# Patient Record
Sex: Female | Born: 1965 | Race: White | Hispanic: No | Marital: Married | Smoking: Never smoker
Health system: Southern US, Community
[De-identification: ages and names within clinical notes are randomized; demographics above are authoritative.]

## PROBLEM LIST (undated history)

## (undated) ENCOUNTER — Inpatient Hospital Stay: Admission: EM | Payer: Self-pay | Source: Home / Self Care

## (undated) DIAGNOSIS — T8859XA Other complications of anesthesia, initial encounter: Secondary | ICD-10-CM

## (undated) DIAGNOSIS — IMO0001 Reserved for inherently not codable concepts without codable children: Secondary | ICD-10-CM

## (undated) DIAGNOSIS — T4145XA Adverse effect of unspecified anesthetic, initial encounter: Secondary | ICD-10-CM

## (undated) DIAGNOSIS — C50919 Malignant neoplasm of unspecified site of unspecified female breast: Secondary | ICD-10-CM

## (undated) DIAGNOSIS — IMO0002 Reserved for concepts with insufficient information to code with codable children: Secondary | ICD-10-CM

## (undated) DIAGNOSIS — E039 Hypothyroidism, unspecified: Secondary | ICD-10-CM

## (undated) DIAGNOSIS — T7840XA Allergy, unspecified, initial encounter: Secondary | ICD-10-CM

## (undated) HISTORY — DX: Reserved for inherently not codable concepts without codable children: IMO0001

## (undated) HISTORY — DX: Allergy, unspecified, initial encounter: T78.40XA

## (undated) HISTORY — DX: Malignant neoplasm of unspecified site of unspecified female breast: C50.919

## (undated) HISTORY — DX: Reserved for concepts with insufficient information to code with codable children: IMO0002

## (undated) HISTORY — PX: WISDOM TOOTH EXTRACTION: SHX21

---

## 2008-02-18 ENCOUNTER — Encounter: Admission: RE | Admit: 2008-02-18 | Discharge: 2008-04-23 | Payer: Self-pay | Admitting: Family Medicine

## 2010-04-20 ENCOUNTER — Other Ambulatory Visit
Admission: RE | Admit: 2010-04-20 | Discharge: 2010-04-20 | Payer: Self-pay | Source: Home / Self Care | Admitting: Family Medicine

## 2012-03-28 ENCOUNTER — Other Ambulatory Visit: Payer: Self-pay | Admitting: Family Medicine

## 2012-04-05 ENCOUNTER — Other Ambulatory Visit: Payer: Self-pay | Admitting: Radiology

## 2012-04-05 DIAGNOSIS — C50911 Malignant neoplasm of unspecified site of right female breast: Secondary | ICD-10-CM

## 2012-04-08 ENCOUNTER — Telehealth: Payer: Self-pay | Admitting: *Deleted

## 2012-04-08 DIAGNOSIS — C50419 Malignant neoplasm of upper-outer quadrant of unspecified female breast: Secondary | ICD-10-CM

## 2012-04-08 NOTE — Telephone Encounter (Signed)
Confirmed [facility] for 04/10/12 at 0800.  Instructions and contact information given.  

## 2012-04-09 ENCOUNTER — Encounter (INDEPENDENT_AMBULATORY_CARE_PROVIDER_SITE_OTHER): Payer: Self-pay | Admitting: Surgery

## 2012-04-09 ENCOUNTER — Ambulatory Visit
Admission: RE | Admit: 2012-04-09 | Discharge: 2012-04-09 | Disposition: A | Discharge status: Home / Self Care | Payer: BC Managed Care – PPO | Source: Ambulatory Visit | Attending: Radiology | Admitting: Radiology

## 2012-04-09 DIAGNOSIS — C50911 Malignant neoplasm of unspecified site of right female breast: Secondary | ICD-10-CM

## 2012-04-09 MED ORDER — GADOBENATE DIMEGLUMINE 529 MG/ML IV SOLN
16.0000 mL | Freq: Once | INTRAVENOUS | Status: AC | PRN
  Administered 2012-04-09: 16 mL via INTRAVENOUS

## 2012-04-10 ENCOUNTER — Ambulatory Visit (HOSPITAL_BASED_OUTPATIENT_CLINIC_OR_DEPARTMENT_OTHER): Payer: BC Managed Care – PPO | Admitting: Oncology

## 2012-04-10 ENCOUNTER — Encounter: Payer: Self-pay | Admitting: *Deleted

## 2012-04-10 ENCOUNTER — Ambulatory Visit
Admission: RE | Admit: 2012-04-10 | Discharge: 2012-04-10 | Disposition: A | Discharge status: Home / Self Care | Payer: BC Managed Care – PPO | Source: Ambulatory Visit | Attending: Radiation Oncology | Admitting: Radiation Oncology

## 2012-04-10 ENCOUNTER — Ambulatory Visit (HOSPITAL_BASED_OUTPATIENT_CLINIC_OR_DEPARTMENT_OTHER): Payer: BC Managed Care – PPO | Admitting: Surgery

## 2012-04-10 ENCOUNTER — Ambulatory Visit: Payer: BC Managed Care – PPO | Attending: Surgery | Admitting: Physical Therapy

## 2012-04-10 ENCOUNTER — Encounter: Payer: Self-pay | Admitting: Oncology

## 2012-04-10 ENCOUNTER — Encounter (INDEPENDENT_AMBULATORY_CARE_PROVIDER_SITE_OTHER): Payer: Self-pay | Admitting: Surgery

## 2012-04-10 ENCOUNTER — Other Ambulatory Visit: Payer: Self-pay | Admitting: Oncology

## 2012-04-10 ENCOUNTER — Other Ambulatory Visit (HOSPITAL_BASED_OUTPATIENT_CLINIC_OR_DEPARTMENT_OTHER): Payer: BC Managed Care – PPO | Admitting: Lab

## 2012-04-10 ENCOUNTER — Ambulatory Visit: Payer: BC Managed Care – PPO

## 2012-04-10 ENCOUNTER — Telehealth: Payer: Self-pay | Admitting: *Deleted

## 2012-04-10 VITALS — BP 117/76 | HR 71 | Temp 98.4°F | Resp 16 | Ht 67.25 in | Wt 171.0 lb

## 2012-04-10 VITALS — BP 117/76 | HR 71 | Temp 98.4°F | Resp 20 | Ht 67.25 in | Wt 171.0 lb

## 2012-04-10 DIAGNOSIS — C50419 Malignant neoplasm of upper-outer quadrant of unspecified female breast: Secondary | ICD-10-CM

## 2012-04-10 DIAGNOSIS — C50919 Malignant neoplasm of unspecified site of unspecified female breast: Secondary | ICD-10-CM | POA: Insufficient documentation

## 2012-04-10 DIAGNOSIS — Z171 Estrogen receptor negative status [ER-]: Secondary | ICD-10-CM

## 2012-04-10 DIAGNOSIS — C773 Secondary and unspecified malignant neoplasm of axilla and upper limb lymph nodes: Secondary | ICD-10-CM

## 2012-04-10 DIAGNOSIS — IMO0001 Reserved for inherently not codable concepts without codable children: Secondary | ICD-10-CM | POA: Insufficient documentation

## 2012-04-10 DIAGNOSIS — R293 Abnormal posture: Secondary | ICD-10-CM | POA: Insufficient documentation

## 2012-04-10 LAB — CANCER ANTIGEN 27.29: CA 27.29: 47 U/mL — ABNORMAL HIGH (ref 0–39)

## 2012-04-10 LAB — CBC WITH DIFFERENTIAL/PLATELET
Basophils Absolute: 0 10*3/uL (ref 0.0–0.1)
EOS%: 0.8 % (ref 0.0–7.0)
Eosinophils Absolute: 0 10*3/uL (ref 0.0–0.5)
HCT: 32.6 % — ABNORMAL LOW (ref 34.8–46.6)
HGB: 10 g/dL — ABNORMAL LOW (ref 11.6–15.9)
MCH: 25 pg — ABNORMAL LOW (ref 25.1–34.0)
NEUT#: 3.5 10*3/uL (ref 1.5–6.5)
NEUT%: 68.5 % (ref 38.4–76.8)
lymph#: 1.3 10*3/uL (ref 0.9–3.3)

## 2012-04-10 LAB — COMPREHENSIVE METABOLIC PANEL (CC13)
CO2: 27 mEq/L (ref 22–29)
Creatinine: 0.8 mg/dL (ref 0.6–1.1)
Glucose: 95 mg/dl (ref 70–99)
Sodium: 138 mEq/L (ref 136–145)
Total Bilirubin: 0.46 mg/dL (ref 0.20–1.20)
Total Protein: 7.5 g/dL (ref 6.4–8.3)

## 2012-04-10 MED ORDER — LORAZEPAM 0.5 MG PO TABS: 0.5000 mg | ORAL_TABLET | Freq: Four times a day (QID) | ORAL | Status: DC | PRN

## 2012-04-10 MED ORDER — DEXAMETHASONE 4 MG PO TABS: ORAL_TABLET | ORAL | Status: DC

## 2012-04-10 MED ORDER — ONDANSETRON HCL 8 MG PO TABS: 8.0000 mg | ORAL_TABLET | Freq: Two times a day (BID) | ORAL | Status: DC | PRN

## 2012-04-10 MED ORDER — PROCHLORPERAZINE MALEATE 10 MG PO TABS: 10.0000 mg | ORAL_TABLET | Freq: Four times a day (QID) | ORAL | Status: DC | PRN

## 2012-04-10 MED ORDER — PROCHLORPERAZINE 25 MG RE SUPP: 25.0000 mg | Freq: Two times a day (BID) | RECTAL | Status: DC | PRN

## 2012-04-10 NOTE — Progress Notes (Signed)
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 female  who palpated a right breast mass. Mammograms were performed which showed dense breasts bilaterally. An ultrasound of the right breast showed an at least 2.3 x 1.7 cm area of abnormality with multiple abnormal lymph nodes. MRI of the bilateral breasts showed asymmetrical enhancement throughout the right breast consistent with multicentric disease. An ultrasound-guided biopsy was recommended. The known area of disease measured 1.9 x 1.9 x 2.3 cm. Multiple abnormal positive right axillary lymph nodes were noted. A biopsy of the breast mass was performed which showed a grade 3 invasive ductal carcinoma which was triple negative with a Ki-67 of 25%. Biopsy of a right axillary lymph node was also positive for malignancy as well as extracapsular extension. She was therefore referred to multidisciplinary clinic today for consideration of treatment options. She has talked to Dr. Jamey Ripa and Dr. Donnie Coffin. She is elected for right mastectomy. She is declined biopsy of these other areas within the breast. She is accompanied by her husband. She is still menstruating. She is GX P4 with her first live birth at the age of 73.[name]  PREVIOUS RADIATION THERAPY: No  PAST MEDICAL HISTORY:  has a past medical history of Breast cancer and Thyroid disease.    PAST SURGICAL HISTORY:No past surgical history on file.  FAMILY HISTORY: family history includes Lung cancer in her maternal grandfather and Prostate cancer in her maternal grandfather.  SOCIAL HISTORY:  reports that she has never smoked. She does not have any smokeless tobacco  history on file. She reports that she does not drink alcohol or use illicit drugs.  ALLERGIES: Review of patient's allergies indicates no known allergies.  MEDICATIONS:  Current Outpatient Prescriptions  Medication Sig Dispense Refill  . Ferrous Sulfate Dried (SLOW RELEASE IRON) 45 MG TBCR Take by mouth daily.      [name] levothyroxine (SYNTHROID, LEVOTHROID) 200 MCG tablet         REVIEW OF SYSTEMS:  A 15 point review of systems is documented in the electronic medical record. This was obtained by the nursing staff. However, I reviewed this with the patient to discuss relevant findings and make appropriate changes.  Pertinent items are noted in HPI.   PHYSICAL EXAM:  Wt Readings from Last 3 Encounters:  04/10/12 171 lb (77.565 kg)  04/10/12 171 lb (77.565 kg)   Temp Readings from Last 3 Encounters:  04/10/12 98.4 F (36.9 C)   04/10/12 98.4 F (36.9 C) Oral   BP Readings from Last 3 Encounters:  04/10/12 117/76  04/10/12 117/76   Pulse Readings from Last 3 Encounters:  04/10/12 71  04/10/12 71   she is a pleasant female in no distress sitting comfortably examining table. She has no palpable supraclavicular cervical adenopathy. She has a palpable right breast mass. She has palpable mobile right axillary lymph nodes. She has no palpable abnormalities the left breast. She has some bruising associated with her biopsy on the right breast. She is alert and oriented x3. She is appropriate mood and affect  judgment.  LABORATORY DATA:  Lab Results  Component Value Date   WBC 5.2 04/10/2012   HGB 10.0* 04/10/2012   HCT 32.6* 04/10/2012  MCV 81.5 04/10/2012   PLT 247 04/10/2012   Lab Results  Component Value Date   NA 138 04/10/2012   K 4.2 04/10/2012   CL 105 04/10/2012   CO2 27 04/10/2012   Lab Results  Component Value Date   ALT 12 04/10/2012   AST 16 04/10/2012   ALKPHOS 68 04/10/2012   BILITOT 0.46 04/10/2012     RADIOGRAPHY: Mr Breast Bilateral W Wo  Contrast  04/09/2012  *RADIOLOGY REPORT*  Clinical Data: Recent diagnosis of invasive ductal carcinoma of the right breast following ultrasound-guided biopsy of a right breast mass in the 11 o'clock position. Right axillary nodal metastatic disease was diagnosed following ultrasound-guided biopsy of a suspicious right axillary lymph node.  BILATERAL BREAST MRI WITH AND WITHOUT CONTRAST  Technique: Multiplanar, multisequence MR images of both breasts were obtained prior to and following the intravenous administration of 16ml of Multihance.  Three dimensional images were evaluated at the independent DynaCad workstation.  Comparison:  Bilateral mammogram 04/02/2012 and post clip mammogram of the right breast 04/05/2012.  Findings: There is a mild background parenchymal enhancement pattern in the left breast.  No mass or suspicious enhancement is identified in the left breast to suggest malignancy.  The left nipple is a partially retracted,  similar in appearance to the left mammogram.  Question if this is a chronic finding for this patient.  There is markedly asymmetric and abnormal enhancement involving all four quadrants of the right breast.  Specifically, there is a dominant irregular mass in the posterior third of the superior right breast centered in the 12 o'clock position, with washout kinetics, measuring 1.9 x 1.9 x 2.3 cm (transverse x AP x craniocaudal).  Biopsy clip artifact is seen slightly anterolateral to the dominant mass.  Additionally, the right breast has innumerable rounded to irregularly shaped nodular areas of enhancement very suspicious for multifocal and multicentric disease.  These nodular areas of enhancement are markedly asymmetric compared to enhancement pattern of the left breast.  Two of the largest masses include a 1.2 cm irregular mass in the medial right breast, middle third, near the level of the nipple. In the anterior third/subareolar right breast in the upper outer quadrant, a 6 mm  nodule demonstrates plateau enhancement.  T2-weighted signal throughout the right breast is asymmetrically prominent compared to the left breast.  There are multiple enlarged level I right axillary lymph nodes, with the largest measuring 2.0 x 1.1 cm axial dimension.  There are also two asymmetrically prominent level II right axillary lymph nodes, the largest measuring 7 x 4 mm.  No internal mammary chain lymphadenopathy is identified.  There is a 7 mm oval T2 bright lesion in the dome of the liver.  IMPRESSION:  1.  Findings highly suspicious for multicentric right breast cancer, involving all four quadrants.  There are innumerable enhancing masses scattered throughout the right breast parenchyma, including a dominant mass in the 12 o'clock position of the right breast.  A second look ultrasound for consideration of biopsy of a second mass within the right breast should be considered to evaluate extent of disease/multicentricity.  If these masses are sonographically occult then MRI-guided biopsy could be performed, if desired. 2.  No MRI evidence of malignancy in the left breast. 3.  Pathologically enlarged level I right axillary lymph nodes consistent with biopsy-proven metastatic disease.  Asymmetrically prominent level II right axillary lymph nodes are suspicious for metastatic involvement. 3.  7 mm T2 bright lesion in the dome of the liver  is statistically likely benign,  but correlation with cross sectional imaging of the abdomen pelvis could be considered.  RECOMMENDATION: Second look ultrasound of the right breast for possible biopsy of a second quadrant or anterior aspect of the breast to confirm extent of disease, as clinically indicated.  THREE-DIMENSIONAL MR IMAGE RENDERING ON INDEPENDENT WORKSTATION:  Three-dimensional MR images were rendered by post-processing of the original MR data on an independent workstation.  The three- dimensional MR images were interpreted, and findings were reported in the  accompanying complete MRI report for this study.  BI-RADS CATEGORY 4:  Suspicious abnormality - biopsy should be considered.   Original Report Authenticated By: Britta Mccreedy, M.D.       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 At this point staging studies are in order and we have ordered a PET/CT scan. We have also ordered genetic testing given her young age of diagnosis. She has all [place] decided on a mastectomy. She'll receive neoadjuvant chemotherapy. She'll be scheduled for port placement. I discussed with her and her husband that the indications for radiation were made prior to start of treatment rather than based on her response to chemotherapy. We discussed her multiple positive lymph nodes is indication for radiation. This is further supported by the fact that her tumor could be in all quadrants and also her triple negative status. We discussed the process of simulation the placement tattoos. We discussed the use of CT planning to avoid lung dose. We discussed the low probability of secondary malignancies. We discussed increased complications with reconstruction. At this point I will plan on seeing her back after chemotherapy and surgery is complete. She met with a member of our patient family support staff as well as with our physical therapist.  I spent 60 minutes  face to face with the patient and more than 50% of that time was spent in counseling and/or coordination of care.   ------------------------------------------------  Lurline Hare, MD

## 2012-04-10 NOTE — Telephone Encounter (Signed)
Gave patient appointment for echo 04-16-2012 pet scan 04-19-2012 chemo class 04-15-2012 04-23-2012 starting at 12:00pm

## 2012-04-10 NOTE — Patient Instructions (Signed)
We will schedule you for placement of a Port-A-Cath for  chemotherapy. My office should call you within a day or so with the date and time.if you do not hear from my office please call [phone]

## 2012-04-10 NOTE — Progress Notes (Signed)
Checked in new pt with no financial concerns. °

## 2012-04-10 NOTE — Progress Notes (Signed)
Nicole Valentine [id number] Apr 23, 1966 46 y.o. 04/10/2012 12:17 PM  CC Nicole Found, MD [address] [place] [address]  REASON FOR CONSULTATION:  Breast cancer Patient was seen in the Multidisciplinary Breast Clinic for discussion of her treatment options. She was seen by Dr. Pierce Valentine, Radiation Onc[facility]  STAGE:   Breast cancer, right, upper outer   Primary site: Breast (Right)   Staging method: AJCC 7th Edition   Clinical: Stage IIB (T2, N1, cM0)   Summary: Stage IIB (T2, N1, cM0)  REFERRING PHYSICIAN: Dr. Merri Valentine  HISTORY OF PRESENT ILLNESS:  Nicole Valentine is a 46 y.o. female.   From [facility]. She is h.ere with her husband Nicole Valentine.  She has been in good health. She had mammogram age 29 and not since then. She detected a mass in her right breast in November 8. She says he referred her for mammogram. Followup study showed a hypoechoic mass right breast 5 cm from the nipple. Multiple lymph nodes were noted in the right axilla. She returned for biopsy of both the axillary node and  mass 04/04/2012.  Pathology demonstrated that time showed essentially triple-negative breast cancer. HER-2 was nonamplified the ratio 1.61. The tumor and the mass was felt to be grade 3. ER and PR negative with an elevated proliferative fraction.   MRI scan performed on 04/09/2012 showed markedly asymmetric abnormal enhancement all 4 quadrants of the right breast. A dominant mass at 12:00 measuring 2.3 x 1.9 x 1.9 cm. Enlarged multiple axillary lymph nodes were seen of both level I level II locations. There was a 7 mm oval T2 bright lesion in the liver felt to be a benign process. Additional biopsies were felt to be important to stage this breast adequately.  Past Medical History:  Past Medical History  Diagnosis Date  . Breast cancer   . Thyroid disease                     on thyroid replacement 2001   She is recently  been Valentine to be neck secondary to menorrhagia and is on iron replacement.  Past Surgical History:  No past surgical history on file.  Family History:  Family History  Problem Relation Age of Onset  . Lung cancer Maternal Grandfather   . Prostate cancer Maternal Grandfather     Social History  History  Substance Use Topics  . Smoking status: Never Smoker   . Smokeless tobacco: Not on file  . Alcohol Use: No   she husband been married for 23 years and has 4 children. They're both in the breast and moved here in 2009. Her husband is a Building services engineer at a [employer]. 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  Allergies:  No Known Allergies  Current Medications:  Current Outpatient Prescriptions  Medication Sig Dispense Refill  . Ferrous Sulfate Dried (SLOW RELEASE IRON) 45 MG TBCR Take by mouth daily.      [name] levothyroxine (SYNTHROID, LEVOTHROID) 200 MCG tablet         OB/GYN History:  G4 P4  Menarche 121/2 No HRT . Nicole Valentine become more frequent heavier. Fertility Discussion: no Prior History of Cancer: no  Health Maintenance:  Colonoscopy no Bone Density no Last PAP smear yes  ECOG PERFORMANCE STATUS: 0 - Asymptomatic  Genetic Counseling/testing: yes   REVIEW OF SYSTEMS:  Patient is been well. She has no complaints. She has been feeling  fatigued and has been taking iron because of menorrhagia. She is concerned that perhaps her tumor marker placement is not as effective.   she denies any other problems i.e. headaches blurry vision shortness of breath cough, GI distress  PHYSICAL EXAMINATION: Blood pressure 117/76, pulse 71, temperature 98.4 F (36.9 C), temperature source Oral, resp. rate 20, height 5' 7.25" (1.708 m), weight 171 lb (77.565 kg), last menstrual period 03/29/2012.  HEENT:  Sclerae anicteric, conjunctivae pink.  Oropharynx clear.  No mucositis or candidiasis.  Nodes:  No cervical, supraclavicular, or axillary lymphadenopathy  palpated.  Breast Exam:  Right breast large mass central portion of breast. Ecchymoses lateral portion of breast. Multiple large lymph nodes right axilla. No nipple retraction.  Left breast is benign.  No masses, discharge, skin change, or nipple inversion..  Lungs:  Clear to auscultation bilaterally.  No crackles, rhonchi, or wheezes.  Heart:  Regular rate and rhythm.  Abdomen:  Soft, nontender.  Positive bowel sounds.  No organomegaly or masses palpated.  Musculoskeletal:  No focal spinal tenderness to palpation.  Extremities:  Benign.  No peripheral edema or cyanosis.  Skin:  Benign.  Neuro:  Nonfocal.      STUDIES/RESULTS: Mr Breast Bilateral W Wo Contrast  04/09/2012  *RADIOLOGY REPORT*  Clinical Data: Recent diagnosis of invasive ductal carcinoma of the right breast following ultrasound-guided biopsy of a right breast mass in the 11 o'clock position. Right axillary nodal metastatic disease was diagnosed following ultrasound-guided biopsy of a suspicious right axillary lymph node.  BILATERAL BREAST MRI WITH AND WITHOUT CONTRAST  Technique: Multiplanar, multisequence MR images of both breasts were obtained prior to and following the intravenous administration of 16ml of Multihance.  Three dimensional images were evaluated at the independent DynaCad workstation.  Comparison:  Bilateral mammogram 04/02/2012 and post clip mammogram of the right breast 04/05/2012.  Findings: There is a mild background parenchymal enhancement pattern in the left breast.  No mass or suspicious enhancement is identified in the left breast to suggest malignancy.  The left nipple is a partially retracted,  similar in appearance to the left mammogram.  Question if this is a chronic finding for this patient.  There is markedly asymmetric and abnormal enhancement involving all four quadrants of the right breast.  Specifically, there is a dominant irregular mass in the posterior third of the superior right breast centered in the 12  o'clock position, with washout kinetics, measuring 1.9 x 1.9 x 2.3 cm (transverse x AP x craniocaudal).  Biopsy clip artifact is seen slightly anterolateral to the dominant mass.  Additionally, the right breast has innumerable rounded to irregularly shaped nodular areas of enhancement very suspicious for multifocal and multicentric disease.  These nodular areas of enhancement are markedly asymmetric compared to enhancement pattern of the left breast.  Two of the largest masses include a 1.2 cm irregular mass in the medial right breast, middle third, near the level of the nipple. In the anterior third/subareolar right breast in the upper outer quadrant, a 6 mm nodule demonstrates plateau enhancement.  T2-weighted signal throughout the right breast is asymmetrically prominent compared to the left breast.  There are multiple enlarged level I right axillary lymph nodes, with the largest measuring 2.0 x 1.1 cm axial dimension.  There are also two asymmetrically prominent level II right axillary lymph nodes, the largest measuring 7 x 4 mm.  No internal mammary chain lymphadenopathy is identified.  There is a 7 mm oval T2 bright lesion in the dome of the  liver.  IMPRESSION:  1.  Findings highly suspicious for multicentric right breast cancer, involving all four quadrants.  There are innumerable enhancing masses scattered throughout the right breast parenchyma, including a dominant mass in the 12 o'clock position of the right breast.  A second look ultrasound for consideration of biopsy of a second mass within the right breast should be considered to evaluate extent of disease/multicentricity.  If these masses are sonographically occult then MRI-guided biopsy could be performed, if desired. 2.  No MRI evidence of malignancy in the left breast. 3.  Pathologically enlarged level I right axillary lymph nodes consistent with biopsy-proven metastatic disease.  Asymmetrically prominent level II right axillary lymph nodes are  suspicious for metastatic involvement. 3.  7 mm T2 bright lesion in the dome of the liver is statistically likely benign,  but correlation with cross sectional imaging of the abdomen pelvis could be considered.  RECOMMENDATION: Second look ultrasound of the right breast for possible biopsy of a second quadrant or anterior aspect of the breast to confirm extent of disease, as clinically indicated.  THREE-DIMENSIONAL MR IMAGE RENDERING ON INDEPENDENT WORKSTATION:  Three-dimensional MR images were rendered by post-processing of the original MR data on an independent workstation.  The three- dimensional MR images were interpreted, and findings were reported in the accompanying complete MRI report for this study.  BI-RADS CATEGORY 4:  Suspicious abnormality - biopsy should be considered.   Original Report Authenticated By: Britta Mccreedy, M.D.      LABS:    Chemistry      Component Value Date/Time   NA 138 04/10/2012 0841   K 4.2 04/10/2012 0841   CL 105 04/10/2012 0841   CO2 27 04/10/2012 0841   BUN 15.0 04/10/2012 0841   CREATININE 0.8 04/10/2012 0841      Component Value Date/Time   CALCIUM 9.1 04/10/2012 0841   ALKPHOS 68 04/10/2012 0841   AST 16 04/10/2012 0841   ALT 12 04/10/2012 0841   BILITOT 0.46 04/10/2012 0841      Lab Results  Component Value Date   WBC 5.2 04/10/2012   HGB 10.0* 04/10/2012   HCT 32.6* 04/10/2012   MCV 81.5 04/10/2012   PLT 247 04/10/2012       PATHOLOGY: triple-negative locally advanced breast cancer clinically T2 N1   ASSESSMENT     we have fairly lengthy discussion today with the patient and her husband. We reviewed her MRI scan. We discussed the options of additional biopsies to document all the abnormalities seen on the MRI which essentially showed diffuse abnormalities with enhancement throughout the right breast. She is inclined to go ahead with mastectomy. I discussed the pros and cons of neoadjuvant chemotherapy up front even in the setting of  the potential mastectomy. I think that that would be to her best interest to hopefully reduce the chances of contaminated margins and to allow for reduction of local recurrence downstream. I described neoadjuvant program which would include 4 cycles of dose dense FEC followed by 4 cycles of dose dense Taxotere. She is in agreement. I discussed side effects and as well. We discussed staging PET scan, port placement, 2-D echo. We will send chemotherapy class. Since starting treatment the first week of December.   Clinical Trial Eligibility: no Multidisciplinary conference discussion yes    PLAN:     as above        Discussion: Patient is being treated per NCCN breast cancer care guidelines appropriate for stage. IIb   Thank  you so much for allowing me to participate in the care of [facility]. I will continue to follow up the patient with you and assist in her care.  All questions were answered. The patient knows to call the clinic with any problems, questions or concerns. We can certainly see the patient much sooner if necessary.  I spent 25 minutes counseling the patient face to face. The total time spent in the appointment was 55 minutes.       Nicole Valentine M.D. FRCP C. 04/10/2012, 12:17 PM

## 2012-04-10 NOTE — Progress Notes (Signed)
Patient ID: Nicole Valentine, female   DOB: 10/02/1965, 46 y.o.   MRN: [id number]  Chief Complaint  Patient presents with  . Breast Cancer    Right    HPI Nicole Valentine is a 46 y.o. female.  She recently found a right breast mass in the upper-outer quadrant of the right breast. She simply had a mammogram which showed what appeared to be a malignancy but an abnormality found in the axilla. This is her first mammogram since she was 46 years of age. She had a core biopsy done which shows invasive ductal carcinoma, triple negative. Core biopsy the lymph node shows metastatic disease. The cancer measures about 2.5 cm, but on MRI there is extensive other areas are very worrisome for additional nodules of cancer. The left side appeared normal. HPI  Past Medical History  Diagnosis Date  . Breast cancer   . Thyroid disease     No past surgical history on file.  Family History  Problem Relation Age of Onset  . Lung cancer Maternal Grandfather   . Prostate cancer Maternal Grandfather     Social History History  Substance Use Topics  . Smoking status: Never Smoker   . Smokeless tobacco: Not on file  . Alcohol Use: No    No Known Allergies  Current Outpatient Prescriptions  Medication Sig Dispense Refill  . Ferrous Sulfate Dried (SLOW RELEASE IRON) 45 MG TBCR Take by mouth daily.      . levothyroxine (SYNTHROID, LEVOTHROID) 200 MCG tablet         Review of Systems Review of Systems  Constitutional: Negative for fever, chills and unexpected weight change.  HENT: Negative for hearing loss, congestion, sore throat, trouble swallowing and voice change.   Eyes: Negative for visual disturbance.  Respiratory: Negative for cough and wheezing.   Cardiovascular: Negative for chest pain, palpitations and leg swelling.  Gastrointestinal: Negative for nausea, vomiting, abdominal pain, diarrhea, constipation, blood in stool, abdominal distention and anal bleeding.  Genitourinary: Negative for  hematuria, vaginal bleeding and difficulty urinating.  Musculoskeletal: Negative for arthralgias.  Skin: Negative for rash and wound.  Neurological: Negative for seizures, syncope and headaches.  Hematological: Negative for adenopathy. Does not bruise/bleed easily.  Psychiatric/Behavioral: Negative for confusion.    Blood pressure 117/76, pulse 71, temperature 98.4 F (36.9 C), resp. rate 16, height 5' 7.25" (1.708 m), weight 171 lb (77.565 kg), last menstrual period 03/29/2012.  Physical Exam Physical Exam  Vitals reviewed. Constitutional: She is oriented to person, place, and time. She appears well-developed and well-nourished. No distress.  HENT:  Head: Normocephalic and atraumatic.  Mouth/Throat: Oropharynx is clear and moist.  Eyes: Conjunctivae normal and EOM are normal. Pupils are equal, round, and reactive to light. No scleral icterus.  Neck: Normal range of motion. Neck supple. No tracheal deviation present. No thyromegaly present.  Cardiovascular: Normal rate, regular rhythm, normal heart sounds and intact distal pulses.  Exam reveals no gallop and no friction rub.   No murmur heard. Pulmonary/Chest: Effort normal and breath sounds normal. No respiratory distress. She has no wheezes. She has no rales.         There is a very hard mass in the upper-outer quadrant as documented on the drawing. The remainder of the breast appears fairly unremarkable and innocuous. The skin and nipple areas all normal. There is some nipple inversion on the left breast is otherwise normal. There is a large ecchymosis in the lateral half of the breast on the right extending up   into the right axilla from her biopsies.  Abdominal: Soft. Bowel sounds are normal. She exhibits no distension and no mass. There is no tenderness. There is no rebound and no guarding.  Musculoskeletal: Normal range of motion. She exhibits no edema and no tenderness.  Lymphadenopathy:    She has no cervical adenopathy.    She  has axillary adenopathy.       Right axillary: Pectoral and lateral adenopathy present.       Right: No supraclavicular adenopathy present.       Left: No supraclavicular adenopathy present.  Neurological: She is alert and oriented to person, place, and time.  Skin: Skin is warm and dry. No rash noted. She is not diaphoretic. No erythema.  Psychiatric: She has a normal mood and affect. Her behavior is normal. Judgment and thought content normal.    Data Reviewed I have reviewed the mammogram and MRI films and reports and discuss them with the radiologist. I have reviewed the pathology reports and slides and discuss them with the pathologist.  Assessment    Clinical stage II right breast cancer, invasive ductal, upper outer quadrant, right breast, triple negative    Plan    I have explained the pathophysiology and staging of breast cancer with particular attention to her exact situation. We discussed the multidisciplinary approach to breast cancer which often includes both medical and radiation oncology consultations.  We also discussed surgical options for the treatment of breast cancer including lumpectomy and mastectomy with possible reconstructive surgery. In addition we talked about the evaluation and management of lymph nodes including a description of sentinel lymph node biopsy and axillary dissections. We reviewed potential complications and risks including bleeding, infection, numbness,  lymphedema, and the potential need for additional surgery.  She understands that for patients who are candidate for lumpectomy or mastectomy there is an equal survival rate with either technique, but a slightly higher local recurrence rate with lumpectomy. In addition she knows that a lumpectomy usually requires postoperative radiation as part of the management of the breast cancer.  We have discussed the likely postoperative course and plans for followup.  I have given the patient some written  information that reviewed all of these issues. I believe her questions are answered and that she has a good understanding of the issues.   We had a lengthy discussion about her diagnosis and about whether she has a single cancer or whether these other areas on the MRI are malignant or not. Her tumor is such that I do not think she is a candidate day for a lumpectomy but might be after chemotherapy assuming these other areas of the breast are not malignant. These other areas are malignant then she'll need a mastectomy. In either case she needs an axillary dissection. She'll also need a Port-A-Cath for chemotherapy and I reviewed the risks and complications of that placement. At this time she thinks she is going to opt for neoadjuvant chemotherapy. I think she could have a post neoadjuvant chemotherapy lumpectomy if these other areas of the breast are negative but his oral car a biopsy to prove. If she is going to have a mastectomy even if these areas are nonmalignant, then no biopsy is needed.I believe she'll likely have postmastectomy radiation and therefore not a candidate for immediate extraction but to discuss reconstructions with her and we could make a plastic surgery appointment if needed. She is also going to see a genetic counselor and has been seen by physical therapy for   lymphedema teaching.       Nicole Valentine 04/10/2012, 11:19 AM    

## 2012-04-10 NOTE — Progress Notes (Signed)
[facility] Psychosocial Distress Screening Clinical Social Work  Patient completed distress screening protocol, and scored a 6 on the Psychosocial Distress Thermometer which indicates moderate distress. Clinical Social Worker met with patient and pt's husband in [facility] to assess for distress and other psychosocial needs.  Pt stated her distress level was much lower after speaking with the physicians and getting information on her treatment plan.  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  CSW reviewed the support services available and the [facility] support team.  Pt was agreeable to CSW making a referral to the [facility] program.  CSw encouraged pt or family to call with any questions or concerns.    Tamala Julian, MSW, LCSW Clinical Social Worker [facility] [phone]

## 2012-04-11 ENCOUNTER — Other Ambulatory Visit: Payer: Self-pay | Admitting: Family Medicine

## 2012-04-11 ENCOUNTER — Other Ambulatory Visit (HOSPITAL_COMMUNITY)
Admission: RE | Admit: 2012-04-11 | Discharge: 2012-04-11 | Disposition: A | Discharge status: Home / Self Care | Payer: BC Managed Care – PPO | Source: Ambulatory Visit | Attending: Family Medicine | Admitting: Family Medicine

## 2012-04-11 DIAGNOSIS — Z0000 Encounter for general adult medical examination without abnormal findings: Secondary | ICD-10-CM | POA: Insufficient documentation

## 2012-04-12 ENCOUNTER — Telehealth: Payer: Self-pay | Admitting: *Deleted

## 2012-04-12 ENCOUNTER — Encounter: Payer: Self-pay | Admitting: *Deleted

## 2012-04-12 NOTE — Telephone Encounter (Signed)
Per staff message and POF I have scheduled appts.  JMW  

## 2012-04-12 NOTE — Telephone Encounter (Signed)
Spoke to pt concerning [facility] from 04/10/12.  Pt denies questions or concerns regarding dx or treatment care plan.  Informed pt that I had anti-nausea prescriptions for her to pick up on Monday before chemo class.  Encourage pt to call with needs.  Contact information given.

## 2012-04-15 ENCOUNTER — Telehealth: Payer: Self-pay | Admitting: *Deleted

## 2012-04-15 ENCOUNTER — Other Ambulatory Visit: Payer: BC Managed Care – PPO

## 2012-04-15 ENCOUNTER — Encounter (HOSPITAL_BASED_OUTPATIENT_CLINIC_OR_DEPARTMENT_OTHER): Payer: Self-pay | Admitting: *Deleted

## 2012-04-15 NOTE — Telephone Encounter (Signed)
Followed up with pt to ensure she didn't have questions after chemo class.  Pt denies questions or concerns.  Pt relayed understanding of anti-nausea medication.  Discussed chemo length and neulasta injection.  Encourage pt to call with questions.

## 2012-04-15 NOTE — Progress Notes (Signed)
No labs needed Said her dad and uncle had a reaction to an anesthesia med-very difficult to wake up-nurses and dr stayed with him. She will find out asap-she did not recall Malig, hyperthermia mentioned.

## 2012-04-16 ENCOUNTER — Ambulatory Visit (HOSPITAL_COMMUNITY)
Admission: RE | Admit: 2012-04-16 | Discharge: 2012-04-16 | Disposition: A | Discharge status: Home / Self Care | Payer: BC Managed Care – PPO | Source: Ambulatory Visit | Attending: Oncology | Admitting: Oncology

## 2012-04-16 DIAGNOSIS — C50919 Malignant neoplasm of unspecified site of unspecified female breast: Secondary | ICD-10-CM | POA: Insufficient documentation

## 2012-04-16 DIAGNOSIS — C50419 Malignant neoplasm of upper-outer quadrant of unspecified female breast: Secondary | ICD-10-CM

## 2012-04-16 DIAGNOSIS — I079 Rheumatic tricuspid valve disease, unspecified: Secondary | ICD-10-CM | POA: Insufficient documentation

## 2012-04-16 NOTE — Progress Notes (Signed)
  Echocardiogram 2D Echocardiogram has been performed.  Nicole Valentine 04/16/2012, 11:20 AM

## 2012-04-17 ENCOUNTER — Encounter: Payer: Self-pay | Admitting: *Deleted

## 2012-04-17 NOTE — Progress Notes (Signed)
Mailed after appt letter to pt. 

## 2012-04-17 NOTE — Progress Notes (Signed)
Called pt back to find out what her father and uncle reacted from post op general anesth. She has [place]ed request [place]ords [place]. It was a muscle relaxant-could not breath-awake but paralyzed- Probably succinylcholine-not sure.

## 2012-04-19 ENCOUNTER — Encounter (HOSPITAL_COMMUNITY): Payer: Self-pay

## 2012-04-19 ENCOUNTER — Encounter (HOSPITAL_COMMUNITY)
Admission: RE | Admit: 2012-04-19 | Discharge: 2012-04-19 | Disposition: A | Discharge status: Home / Self Care | Payer: BC Managed Care – PPO | Source: Ambulatory Visit | Attending: Oncology | Admitting: Oncology

## 2012-04-19 DIAGNOSIS — C773 Secondary and unspecified malignant neoplasm of axilla and upper limb lymph nodes: Secondary | ICD-10-CM | POA: Insufficient documentation

## 2012-04-19 DIAGNOSIS — C50419 Malignant neoplasm of upper-outer quadrant of unspecified female breast: Secondary | ICD-10-CM | POA: Insufficient documentation

## 2012-04-19 DIAGNOSIS — K7689 Other specified diseases of liver: Secondary | ICD-10-CM | POA: Insufficient documentation

## 2012-04-19 DIAGNOSIS — N9489 Other specified conditions associated with female genital organs and menstrual cycle: Secondary | ICD-10-CM | POA: Insufficient documentation

## 2012-04-19 MED ORDER — FLUDEOXYGLUCOSE F - 18 (FDG) INJECTION
17.2000 | Freq: Once | INTRAVENOUS | Status: AC | PRN
  Administered 2012-04-19: 17.2 via INTRAVENOUS

## 2012-04-22 ENCOUNTER — Ambulatory Visit (HOSPITAL_COMMUNITY): Payer: BC Managed Care – PPO

## 2012-04-22 ENCOUNTER — Encounter (HOSPITAL_BASED_OUTPATIENT_CLINIC_OR_DEPARTMENT_OTHER): Payer: Self-pay | Admitting: *Deleted

## 2012-04-22 ENCOUNTER — Encounter (HOSPITAL_BASED_OUTPATIENT_CLINIC_OR_DEPARTMENT_OTHER)
Admission: RE | Disposition: A | Discharge status: Home / Self Care | Payer: Self-pay | Source: Ambulatory Visit | Attending: Surgery

## 2012-04-22 ENCOUNTER — Encounter (HOSPITAL_BASED_OUTPATIENT_CLINIC_OR_DEPARTMENT_OTHER): Payer: Self-pay | Admitting: Certified Registered Nurse Anesthetist

## 2012-04-22 ENCOUNTER — Ambulatory Visit (HOSPITAL_BASED_OUTPATIENT_CLINIC_OR_DEPARTMENT_OTHER): Payer: BC Managed Care – PPO | Admitting: Certified Registered Nurse Anesthetist

## 2012-04-22 ENCOUNTER — Ambulatory Visit (HOSPITAL_BASED_OUTPATIENT_CLINIC_OR_DEPARTMENT_OTHER)
Admission: RE | Admit: 2012-04-22 | Discharge: 2012-04-22 | Disposition: A | Discharge status: Home / Self Care | Payer: BC Managed Care – PPO | Source: Ambulatory Visit | Attending: Surgery | Admitting: Surgery

## 2012-04-22 DIAGNOSIS — Z8042 Family history of malignant neoplasm of prostate: Secondary | ICD-10-CM | POA: Insufficient documentation

## 2012-04-22 DIAGNOSIS — C50419 Malignant neoplasm of upper-outer quadrant of unspecified female breast: Secondary | ICD-10-CM

## 2012-04-22 DIAGNOSIS — Z801 Family history of malignant neoplasm of trachea, bronchus and lung: Secondary | ICD-10-CM | POA: Insufficient documentation

## 2012-04-22 DIAGNOSIS — C50919 Malignant neoplasm of unspecified site of unspecified female breast: Secondary | ICD-10-CM | POA: Insufficient documentation

## 2012-04-22 DIAGNOSIS — E039 Hypothyroidism, unspecified: Secondary | ICD-10-CM | POA: Insufficient documentation

## 2012-04-22 HISTORY — DX: Adverse effect of unspecified anesthetic, initial encounter: T41.45XA

## 2012-04-22 HISTORY — PX: PORTACATH PLACEMENT: SHX2246

## 2012-04-22 HISTORY — DX: Hypothyroidism, unspecified: E03.9

## 2012-04-22 HISTORY — DX: Other complications of anesthesia, initial encounter: T88.59XA

## 2012-04-22 SURGERY — INSERTION, TUNNELED CENTRAL VENOUS DEVICE, WITH PORT
Anesthesia: General | Site: Chest | Laterality: Left | Wound class: Clean

## 2012-04-22 MED ORDER — CHLORHEXIDINE GLUCONATE 4 % EX LIQD: 1.0000 "application " | Freq: Once | CUTANEOUS | Status: DC

## 2012-04-22 MED ORDER — HEPARIN (PORCINE) IN NACL 2-0.9 UNIT/ML-% IJ SOLN
INTRAMUSCULAR | Status: DC | PRN
  Administered 2012-04-22: 1 via INTRAVENOUS

## 2012-04-22 MED ORDER — OXYCODONE HCL 5 MG PO TABS: 5.0000 mg | ORAL_TABLET | Freq: Once | ORAL | Status: DC | PRN

## 2012-04-22 MED ORDER — MIDAZOLAM HCL 2 MG/ML PO SYRP: 0.5000 mg/kg | ORAL_SOLUTION | Freq: Once | ORAL | Status: DC | PRN

## 2012-04-22 MED ORDER — BUPIVACAINE HCL (PF) 0.25 % IJ SOLN
INTRAMUSCULAR | Status: DC | PRN
  Administered 2012-04-22: 22 mL

## 2012-04-22 MED ORDER — ONDANSETRON HCL 4 MG/2ML IJ SOLN
INTRAMUSCULAR | Status: DC | PRN
  Administered 2012-04-22: 4 mg via INTRAVENOUS

## 2012-04-22 MED ORDER — MIDAZOLAM HCL 2 MG/2ML IJ SOLN: 1.0000 mg | INTRAMUSCULAR | Status: DC | PRN

## 2012-04-22 MED ORDER — PROMETHAZINE HCL 25 MG/ML IJ SOLN: 6.2500 mg | INTRAMUSCULAR | Status: DC | PRN

## 2012-04-22 MED ORDER — MIDAZOLAM HCL 5 MG/5ML IJ SOLN
INTRAMUSCULAR | Status: DC | PRN
  Administered 2012-04-22: 1 mg via INTRAVENOUS

## 2012-04-22 MED ORDER — OXYCODONE HCL 5 MG/5ML PO SOLN: 5.0000 mg | Freq: Once | ORAL | Status: DC | PRN

## 2012-04-22 MED ORDER — FENTANYL CITRATE 0.05 MG/ML IJ SOLN
INTRAMUSCULAR | Status: DC | PRN
  Administered 2012-04-22: 50 ug via INTRAVENOUS

## 2012-04-22 MED ORDER — CEFAZOLIN SODIUM-DEXTROSE 2-3 GM-% IV SOLR
2.0000 g | INTRAVENOUS | Status: AC
  Administered 2012-04-22: 2 g via INTRAVENOUS

## 2012-04-22 MED ORDER — MEPERIDINE HCL 25 MG/ML IJ SOLN
6.2500 mg | INTRAMUSCULAR | Status: DC | PRN
Start: 2012-04-22 — End: 2012-04-22

## 2012-04-22 MED ORDER — FENTANYL CITRATE 0.05 MG/ML IJ SOLN: 50.0000 ug | INTRAMUSCULAR | Status: DC | PRN

## 2012-04-22 MED ORDER — HEPARIN SOD (PORK) LOCK FLUSH 100 UNIT/ML IV SOLN
INTRAVENOUS | Status: DC | PRN
  Administered 2012-04-22: 500 [IU] via INTRAVENOUS

## 2012-04-22 MED ORDER — FENTANYL CITRATE 0.05 MG/ML IJ SOLN: 25.0000 ug | INTRAMUSCULAR | Status: DC | PRN

## 2012-04-22 MED ORDER — MIDAZOLAM HCL 2 MG/2ML IJ SOLN: 0.5000 mg | Freq: Once | INTRAMUSCULAR | Status: DC | PRN

## 2012-04-22 MED ORDER — LIDOCAINE HCL (CARDIAC) 20 MG/ML IV SOLN
INTRAVENOUS | Status: DC | PRN
  Administered 2012-04-22: 30 mg via INTRAVENOUS

## 2012-04-22 MED ORDER — PROPOFOL 10 MG/ML IV BOLUS
INTRAVENOUS | Status: DC | PRN
  Administered 2012-04-22: 150 mg via INTRAVENOUS

## 2012-04-22 MED ORDER — HYDROCODONE-ACETAMINOPHEN 5-325 MG PO TABS: 1.0000 | ORAL_TABLET | ORAL | Status: DC | PRN

## 2012-04-22 MED ORDER — DEXAMETHASONE SODIUM PHOSPHATE 4 MG/ML IJ SOLN
INTRAMUSCULAR | Status: DC | PRN
  Administered 2012-04-22: 10 mg via INTRAVENOUS

## 2012-04-22 MED ORDER — LACTATED RINGERS IV SOLN
INTRAVENOUS | Status: DC
  Administered 2012-04-22 (×2): via INTRAVENOUS

## 2012-04-22 SURGICAL SUPPLY — 47 items
BAG DECANTER FOR FLEXI CONT (MISCELLANEOUS) ×2 IMPLANT
BENZOIN TINCTURE PRP APPL 2/3 (GAUZE/BANDAGES/DRESSINGS) IMPLANT
BLADE HEX COATED 2.75 (ELECTRODE) ×2 IMPLANT
BLADE SURG 15 STRL LF DISP TIS (BLADE) ×1 IMPLANT
BLADE SURG 15 STRL SS (BLADE) ×1
CANISTER SUCTION 1200CC (MISCELLANEOUS) IMPLANT
CHLORAPREP W/TINT 26ML (MISCELLANEOUS) ×2 IMPLANT
CLOTH BEACON ORANGE TIMEOUT ST (SAFETY) ×2 IMPLANT
COVER MAYO STAND STRL (DRAPES) ×2 IMPLANT
COVER TABLE BACK 60X90 (DRAPES) ×2 IMPLANT
DECANTER SPIKE VIAL GLASS SM (MISCELLANEOUS) ×2 IMPLANT
DERMABOND ADVANCED (GAUZE/BANDAGES/DRESSINGS) ×1
DERMABOND ADVANCED .7 DNX12 (GAUZE/BANDAGES/DRESSINGS) ×1 IMPLANT
DRAPE C-ARM 42X72 X-RAY (DRAPES) ×2 IMPLANT
DRAPE LAPAROTOMY TRNSV 102X78 (DRAPE) ×2 IMPLANT
DRAPE UTILITY XL STRL (DRAPES) ×2 IMPLANT
ELECT REM PT RETURN 9FT ADLT (ELECTROSURGICAL) ×2
ELECTRODE REM PT RTRN 9FT ADLT (ELECTROSURGICAL) ×1 IMPLANT
GLOVE BIO SURGEON STRL SZ 6.5 (GLOVE) ×4 IMPLANT
GLOVE EUDERMIC 7 POWDERFREE (GLOVE) ×2 IMPLANT
GOWN PREVENTION PLUS XLARGE (GOWN DISPOSABLE) ×4 IMPLANT
IV CATH PLACEMENT UNIT 16 GA (IV SOLUTION) IMPLANT
IV HEPARIN 1000UNITS/500ML (IV SOLUTION) ×2 IMPLANT
IV KIT MINILOC 20X1 SAFETY (NEEDLE) IMPLANT
KIT BARDPORT ISP (Port) IMPLANT
KIT PORT POWER 8FR ISP CVUE (Catheter) ×2 IMPLANT
KIT PORT POWER ISP 8FR (Catheter) IMPLANT
KIT POWER PORT SLIM 6FR (PORTABLE EQUIPMENT SUPPLIES) IMPLANT
NEEDLE HYPO 25X1 1.5 SAFETY (NEEDLE) ×2 IMPLANT
PACK BASIN DAY SURGERY FS (CUSTOM PROCEDURE TRAY) ×2 IMPLANT
PENCIL BUTTON HOLSTER BLD 10FT (ELECTRODE) ×2 IMPLANT
SET SHEATH INTRODUCER 10FR (MISCELLANEOUS) IMPLANT
SHEATH COOK PEEL AWAY SET 9F (SHEATH) IMPLANT
SHEET MEDIUM DRAPE 40X70 STRL (DRAPES) IMPLANT
SLEEVE SCD COMPRESS KNEE MED (MISCELLANEOUS) ×2 IMPLANT
STAPLER VISISTAT (STAPLE) IMPLANT
STRIP CLOSURE SKIN 1/2X4 (GAUZE/BANDAGES/DRESSINGS) IMPLANT
SUT MNCRL AB 4-0 PS2 18 (SUTURE) ×2 IMPLANT
SUT PROLENE 2 0 SH DA (SUTURE) ×2 IMPLANT
SUT VICRYL 3-0 CR8 SH (SUTURE) ×2 IMPLANT
SYR 5ML LUER SLIP (SYRINGE) ×2 IMPLANT
SYR CONTROL 10ML LL (SYRINGE) ×2 IMPLANT
TOWEL OR 17X24 6PK STRL BLUE (TOWEL DISPOSABLE) ×4 IMPLANT
TOWEL OR NON WOVEN STRL DISP B (DISPOSABLE) ×2 IMPLANT
TUBE CONNECTING 20X1/4 (TUBING) IMPLANT
WATER STERILE IRR 1000ML POUR (IV SOLUTION) ×2 IMPLANT
YANKAUER SUCT BULB TIP NO VENT (SUCTIONS) IMPLANT

## 2012-04-22 NOTE — Anesthesia Preprocedure Evaluation (Addendum)
Anesthesia Evaluation  Patient identified by MRN, date of birth, ID band Patient awake    Reviewed: Allergy & Precautions, H&P , NPO status , Patient's Chart, lab work & pertinent test results  History of Anesthesia Complications (+) Family history of anesthesia reaction  Airway Mallampati: I TM Distance: >3 FB Neck ROM: Full    Dental No notable dental hx. (+) Teeth Intact and Dental Advisory Given   Pulmonary neg pulmonary ROS,  breath sounds clear to auscultation  Pulmonary exam normal       Cardiovascular negative cardio ROS  Rhythm:Regular Rate:Normal     Neuro/Psych negative neurological ROS  negative psych ROS   GI/Hepatic negative GI ROS, Neg liver ROS,   Endo/Other  Hypothyroidism (on replacement)   Renal/GU negative Renal ROS     Musculoskeletal   Abdominal   Peds  Hematology   Anesthesia Other Findings   Reproductive/Obstetrics                           Anesthesia Physical Anesthesia Plan  ASA: II  Anesthesia Plan: General   Post-op Pain Management:    Induction: Intravenous  Airway Management Planned: LMA  Additional Equipment:   Intra-op Plan:   Post-operative Plan:   Informed Consent: I have reviewed the patients History and Physical, chart, labs and discussed the procedure including the risks, benefits and alternatives for the proposed anesthesia with the patient or authorized representative who has indicated his/her understanding and acceptance.   Dental advisory given  Plan Discussed with: CRNA and Surgeon  Anesthesia Plan Comments: (Plan routine monitors, GA-LMA OK Avoid Succinylcholine because of family prolonged relaxation with intra-op muscle relaxants)        Anesthesia Quick Evaluation

## 2012-04-22 NOTE — H&P (View-Only) (Signed)
Patient ID: Nicole Valentine, female   DOB: [birth year], 46 y.o.   MRN: [id number]  Chief Complaint  Patient presents with  . Breast Cancer    Right    HPI Nicole Valentine is a 46 y.o. female.  She recently found a right breast mass in the upper-outer quadrant of the right breast. She simply had a mammogram which showed what appeared to be a malignancy but an abnormality found in the axilla. This is her first mammogram since she was 46 years of age. She had a core biopsy done which shows invasive ductal carcinoma, triple negative. Core biopsy the lymph node shows metastatic disease. The cancer measures about 2.5 cm, but on MRI there is extensive other areas are very worrisome for additional nodules of cancer. The left side appeared normal. HPI  Past Medical History  Diagnosis Date  . Breast cancer   . Thyroid disease     No past surgical history on file.  Family History  Problem Relation Age of Onset  . Lung cancer Maternal Grandfather   . Prostate cancer Maternal Grandfather     Social History History  Substance Use Topics  . Smoking status: Never Smoker   . Smokeless tobacco: Not on file  . Alcohol Use: No    No Known Allergies  Current Outpatient Prescriptions  Medication Sig Dispense Refill  . Ferrous Sulfate Dried (SLOW RELEASE IRON) 45 MG TBCR Take by mouth daily.      [name] levothyroxine (SYNTHROID, LEVOTHROID) 200 MCG tablet         Review of Systems Review of Systems  Constitutional: Negative for fever, chills and unexpected weight change.  HENT: Negative for hearing loss, congestion, sore throat, trouble swallowing and voice change.   Eyes: Negative for visual disturbance.  Respiratory: Negative for cough and wheezing.   Cardiovascular: Negative for chest pain, palpitations and leg swelling.  Gastrointestinal: Negative for nausea, vomiting, abdominal pain, diarrhea, constipation, blood in stool, abdominal distention and anal bleeding.  Genitourinary: Negative for  hematuria, vaginal bleeding and difficulty urinating.  Musculoskeletal: Negative for arthralgias.  Skin: Negative for rash and wound.  Neurological: Negative for seizures, syncope and headaches.  Hematological: Negative for adenopathy. Does not bruise/bleed easily.  Psychiatric/Behavioral: Negative for confusion.    Blood pressure 117/76, pulse 71, temperature 98.4 F (36.9 C), resp. rate 16, height 5' 7.25" (1.708 m), weight 171 lb (77.565 kg), last menstrual period 03/29/2012.  Physical Exam Physical Exam  Vitals reviewed. Constitutional: She is oriented to person, place, and time. She appears well-developed and well-nourished. No distress.  HENT:  Head: Normocephalic and atraumatic.  Mouth/Throat: Oropharynx is clear and moist.  Eyes: Conjunctivae normal and EOM are normal. Pupils are equal, round, and reactive to light. No scleral icterus.  Neck: Normal range of motion. Neck supple. No tracheal deviation present. No thyromegaly present.  Cardiovascular: Normal rate, regular rhythm, normal heart sounds and intact distal pulses.  Exam reveals no gallop and no friction rub.   No murmur heard. Pulmonary/Chest: Effort normal and breath sounds normal. No respiratory distress. She has no wheezes. She has no rales.         There is a very hard mass in the upper-outer quadrant as documented on the drawing. The remainder of the breast appears fairly unremarkable and innocuous. The skin and nipple areas all normal. There is some nipple inversion on the left breast is otherwise normal. There is a large ecchymosis in the lateral half of the breast on the right extending up  into the right axilla from her biopsies.  Abdominal: Soft. Bowel sounds are normal. She exhibits no distension and no mass. There is no tenderness. There is no rebound and no guarding.  Musculoskeletal: Normal range of motion. She exhibits no edema and no tenderness.  Lymphadenopathy:    She has no cervical adenopathy.    She  has axillary adenopathy.       Right axillary: Pectoral and lateral adenopathy present.       Right: No supraclavicular adenopathy present.       Left: No supraclavicular adenopathy present.  Neurological: She is alert and oriented to person, place, and time.  Skin: Skin is warm and dry. No rash noted. She is not diaphoretic. No erythema.  Psychiatric: She has a normal mood and affect. Her behavior is normal. Judgment and thought content normal.    Data Reviewed I have reviewed the mammogram and MRI films and reports and discuss them with the radiologist. I have reviewed the pathology reports and slides and discuss them with the pathologist.  Assessment    Clinical stage II right breast cancer, invasive ductal, upper outer quadrant, right breast, triple negative    Plan    I have explained the pathophysiology and staging of breast cancer with particular attention to her exact situation. We discussed the multidisciplinary approach to breast cancer which often includes both medical and radiation oncology consultations.  We also discussed surgical options for the treatment of breast cancer including lumpectomy and mastectomy with possible reconstructive surgery. In addition we talked about the evaluation and management of lymph nodes including a description of sentinel lymph node biopsy and axillary dissections. We reviewed potential complications and risks including bleeding, infection, numbness,  lymphedema, and the potential need for additional surgery.  She understands that for patients who are candidate for lumpectomy or mastectomy there is an equal survival rate with either technique, but a slightly higher local recurrence rate with lumpectomy. In addition she knows that a lumpectomy usually requires postoperative radiation as part of the management of the breast cancer.  We have discussed the likely postoperative course and plans for followup.  I have given the patient some written  information that reviewed all of these issues. I believe her questions are answered and that she has a good understanding of the issues.   We had a lengthy discussion about her diagnosis and about whether she has a single cancer or whether these other areas on the MRI are malignant or not. Her tumor is such that I do not think she is a candidate day for a lumpectomy but might be after chemotherapy assuming these other areas of the breast are not malignant. These other areas are malignant then she'll need a mastectomy. In either case she needs an axillary dissection. She'll also need a Port-A-Cath for chemotherapy and I reviewed the risks and complications of that placement. At this time she thinks she is going to opt for neoadjuvant chemotherapy. I think she could have a post neoadjuvant chemotherapy lumpectomy if these other areas of the breast are negative but his oral car a biopsy to prove. If she is going to have a mastectomy even if these areas are nonmalignant, then no biopsy is needed.I believe she'll likely have postmastectomy radiation and therefore not a candidate for immediate extraction but to discuss reconstructions with her and we could make a plastic surgery appointment if needed. She is also going to see a Dentist and has been seen by physical therapy for  lymphedema teaching.       Ijeoma Loor J 04/10/2012, 11:19 AM

## 2012-04-22 NOTE — Op Note (Signed)
Nicole Valentine [birth year] [id number] 04/10/2012  Preoperative diagnosis: PAC needed  Postoperative diagnosis: Same  Procedure: Portacath Placement  Surgeon: Currie Paris, MD, FACS  Anesthesia: General  Clinical History and Indications: The patient is getting ready to begin chemotherapy for her cancer. She  needs a Port-A-Cath for venous access.  Description of Procedure: I have seen the patient in the holding area and confirmed the plans for the procedure as noted above. I reviewed the risks and complications again and the patient has no further questions. She wishes to proceed.   The patient was then taken to the operating room. After satisfactory general  anesthesia had been obtained the upper chest and lower neck were prepped and draped as a sterile field. The timeout was done.  The left subclavian vein was entered on the second attempt and the guidewire threaded into the superior vena cava right atrial area under fluoroscopic guidance. An incision was then made on the anterior chest wall and a subcutaneous pocket fashioned for the port reservoir.  The port tubing was then brought through a subcutaneous tunnel from the port site to the guidewire site. The dilator and peel-away sheath were then advanced over the guidewire while monitoring this with fluoroscopy. The guidewire and dilator were removed and the tubing threaded to approximately 22 cm. The peel-away sheath was then removed. The catheter aspirated and flushed easily. Using fluoroscopy the tip was backed out into the superior vena cava right atrial junction area. It aspirated and flushed easily. The reservoir was attached and the locking mechanism engaged. That aspirated and flushed easily.  The reservoir was secured to the fascia with 2 sutures of 2-0 Prolene. A final check with fluoroscopy was done to make sure we had no kinks and good positioning of the tip of the catheter. Everything appeared to be okay. The catheter  was aspirated, flushed with dilute heparin and then concentrated aqueous heparin.  The incision was then closed with interrupted 3-0 Vicryl, and 4-0 Monocryl subcuticular with Dermabond on the skin.  There were no operative complications. Estimated blood loss was minimal. All counts were correct. The patient tolerated the procedure well.  Currie Paris, MD, FACS 04/22/2012 12:42 PM

## 2012-04-22 NOTE — Anesthesia Postprocedure Evaluation (Signed)
Anesthesia Post Note  Patient: Nicole Valentine  Procedure(s) Performed: Procedure(s) (LRB): INSERTION PORT-A-CATH (Left)  Anesthesia type: general  Patient location: PACU  Post pain: Pain level controlled  Post assessment: Patient's Cardiovascular Status Stable  Last Vitals:  Filed Vitals:   04/22/12 1436  BP: 121/78  Pulse: 67  Temp: 36.6 C  Resp: 14    Post vital signs: Reviewed and stable  Level of consciousness: sedated  Complications: No apparent anesthesia complications

## 2012-04-22 NOTE — Transfer of Care (Signed)
Immediate Anesthesia Transfer of Care Note  Patient: Nicole Valentine  Procedure(s) Performed: Procedure(s) (LRB) with comments: INSERTION PORT-A-CATH (Left)  Patient Location: PACU  Anesthesia Type:General  Level of Consciousness: awake, alert , oriented and patient cooperative  Airway & Oxygen Therapy: Patient Spontanous Breathing and Patient connected to face mask oxygen  Post-op Assessment: Report given to PACU RN and Post -op Vital signs reviewed and stable  Post vital signs: Reviewed and stable  Complications: No apparent anesthesia complications

## 2012-04-22 NOTE — Interval H&P Note (Deleted)
History and Physical Interval Note:  04/22/2012 11:35 AM  Nicole Valentine  has presented today for surgery, with the diagnosis of left breast cancer  The various methods of treatment have been discussed with the patient and family. After consideration of risks, benefits and other options for treatment, the patient has consented to  Procedure(s) (LRB) with comments: INSERTION PORT-A-CATH (N/A) as a surgical intervention .  The patient's history has been reviewed, patient examined, no change in status, stable for surgery.  I have reviewed the patient's chart and labs.  Questions were answered to the patient's satisfaction.     [place] Whedbee J

## 2012-04-22 NOTE — Interval H&P Note (Signed)
History and Physical Interval Note:  04/22/2012 12:52 PM  Nicole Valentine  has presented today for surgery, with the diagnosis of right breast cancer  The various methods of treatment have been discussed with the patient and family. After consideration of risks, benefits and other options for treatment, the patient has consented to  Procedure(s) (LRB) with comments: INSERTION PORT-A-CATH (Left) as a surgical intervention .  The patient's history has been reviewed, patient examined, no change in status, stable for surgery.  I have reviewed the patient's chart and labs.  Questions were answered to the patient's satisfaction.     [name]

## 2012-04-22 NOTE — Anesthesia Procedure Notes (Signed)
Procedure Name: LMA Insertion Date/Time: 04/22/2012 12:00 PM Performed by: Burrel Legrand D Pre-anesthesia Checklist: Patient identified, Emergency Drugs available, Suction available and Patient being monitored Patient Re-evaluated:Patient Re-evaluated prior to inductionOxygen Delivery Method: Circle System Utilized Preoxygenation: Pre-oxygenation with 100% oxygen Intubation Type: IV induction Ventilation: Mask ventilation without difficulty LMA: LMA inserted LMA Size: 4.0 Number of attempts: 1 Airway Equipment and Method: bite block Placement Confirmation: positive ETCO2 Tube secured with: Tape Dental Injury: Teeth and Oropharynx as per pre-operative assessment

## 2012-04-23 ENCOUNTER — Other Ambulatory Visit (HOSPITAL_BASED_OUTPATIENT_CLINIC_OR_DEPARTMENT_OTHER): Payer: BC Managed Care – PPO | Admitting: Lab

## 2012-04-23 ENCOUNTER — Other Ambulatory Visit: Payer: Self-pay | Admitting: *Deleted

## 2012-04-23 ENCOUNTER — Encounter (HOSPITAL_BASED_OUTPATIENT_CLINIC_OR_DEPARTMENT_OTHER): Payer: Self-pay | Admitting: Surgery

## 2012-04-23 ENCOUNTER — Ambulatory Visit (HOSPITAL_BASED_OUTPATIENT_CLINIC_OR_DEPARTMENT_OTHER): Payer: BC Managed Care – PPO | Admitting: Oncology

## 2012-04-23 ENCOUNTER — Encounter: Payer: Self-pay | Admitting: Oncology

## 2012-04-23 ENCOUNTER — Telehealth: Payer: Self-pay | Admitting: *Deleted

## 2012-04-23 VITALS — BP 115/72 | HR 70 | Temp 98.2°F | Resp 20 | Ht 67.25 in | Wt 172.3 lb

## 2012-04-23 DIAGNOSIS — C50419 Malignant neoplasm of upper-outer quadrant of unspecified female breast: Secondary | ICD-10-CM

## 2012-04-23 DIAGNOSIS — Z171 Estrogen receptor negative status [ER-]: Secondary | ICD-10-CM

## 2012-04-23 DIAGNOSIS — C773 Secondary and unspecified malignant neoplasm of axilla and upper limb lymph nodes: Secondary | ICD-10-CM

## 2012-04-23 LAB — CBC WITH DIFFERENTIAL/PLATELET
BASO%: 0.2 % (ref 0.0–2.0)
EOS%: 0.4 % (ref 0.0–7.0)
LYMPH%: 20.7 % (ref 14.0–49.7)
MCH: 26.9 pg (ref 25.1–34.0)
MCHC: 32.1 g/dL (ref 31.5–36.0)
MCV: 83.9 fL (ref 79.5–101.0)
MONO%: 7 % (ref 0.0–14.0)
Platelets: 196 10*3/uL (ref 145–400)
RBC: 3.77 10*6/uL (ref 3.70–5.45)
RDW: 22.3 % — ABNORMAL HIGH (ref 11.2–14.5)

## 2012-04-23 LAB — COMPREHENSIVE METABOLIC PANEL (CC13)
ALT: 18 U/L (ref 0–55)
AST: 16 U/L (ref 5–34)
Alkaline Phosphatase: 63 U/L (ref 40–150)
Glucose: 95 mg/dl (ref 70–99)
Potassium: 4.1 mEq/L (ref 3.5–5.1)
Sodium: 140 mEq/L (ref 136–145)
Total Bilirubin: 0.46 mg/dL (ref 0.20–1.20)
Total Protein: 7 g/dL (ref 6.4–8.3)

## 2012-04-23 MED ORDER — LIDOCAINE-PRILOCAINE 2.5-2.5 % EX CREA: TOPICAL_CREAM | CUTANEOUS | Status: DC | PRN

## 2012-04-23 NOTE — Telephone Encounter (Signed)
Gave patient appointment for 05-07-2012 starting at 9:00am

## 2012-04-24 ENCOUNTER — Other Ambulatory Visit: Payer: Self-pay | Admitting: *Deleted

## 2012-04-25 ENCOUNTER — Encounter: Payer: Self-pay | Admitting: Oncology

## 2012-04-26 ENCOUNTER — Other Ambulatory Visit: Payer: Self-pay | Admitting: Medical Oncology

## 2012-04-26 ENCOUNTER — Ambulatory Visit (HOSPITAL_BASED_OUTPATIENT_CLINIC_OR_DEPARTMENT_OTHER): Payer: BC Managed Care – PPO

## 2012-04-26 ENCOUNTER — Telehealth: Payer: Self-pay | Admitting: *Deleted

## 2012-04-26 DIAGNOSIS — C50419 Malignant neoplasm of upper-outer quadrant of unspecified female breast: Secondary | ICD-10-CM

## 2012-04-26 DIAGNOSIS — Z5111 Encounter for antineoplastic chemotherapy: Secondary | ICD-10-CM

## 2012-04-26 DIAGNOSIS — C50919 Malignant neoplasm of unspecified site of unspecified female breast: Secondary | ICD-10-CM

## 2012-04-26 DIAGNOSIS — C773 Secondary and unspecified malignant neoplasm of axilla and upper limb lymph nodes: Secondary | ICD-10-CM

## 2012-04-26 MED ORDER — HEPARIN SOD (PORK) LOCK FLUSH 100 UNIT/ML IV SOLN
500.0000 [IU] | Freq: Once | INTRAVENOUS | Status: AC | PRN
  Administered 2012-04-26: 500 [IU]
  Filled 2012-04-26: qty 5

## 2012-04-26 MED ORDER — EPIRUBICIN HCL CHEMO IV INJECTION 200 MG/100ML
100.0000 mg/m2 | Freq: Once | INTRAVENOUS | Status: AC
  Administered 2012-04-26: 192 mg via INTRAVENOUS
  Filled 2012-04-26: qty 96

## 2012-04-26 MED ORDER — SODIUM CHLORIDE 0.9 % IV SOLN
500.0000 mg/m2 | Freq: Once | INTRAVENOUS | Status: AC
  Administered 2012-04-26: 960 mg via INTRAVENOUS
  Filled 2012-04-26: qty 48

## 2012-04-26 MED ORDER — SODIUM CHLORIDE 0.9 % IV SOLN
Freq: Once | INTRAVENOUS | Status: AC
  Administered 2012-04-26: 11:00:00 via INTRAVENOUS

## 2012-04-26 MED ORDER — PALONOSETRON HCL INJECTION 0.25 MG/5ML
0.2500 mg | Freq: Once | INTRAVENOUS | Status: AC
  Administered 2012-04-26: 0.25 mg via INTRAVENOUS

## 2012-04-26 MED ORDER — FOSAPREPITANT DIMEGLUMINE INJECTION 150 MG
150.0000 mg | Freq: Once | INTRAVENOUS | Status: AC
  Administered 2012-04-26: 150 mg via INTRAVENOUS
  Filled 2012-04-26: qty 5

## 2012-04-26 MED ORDER — DEXAMETHASONE SODIUM PHOSPHATE 4 MG/ML IJ SOLN
12.0000 mg | Freq: Once | INTRAMUSCULAR | Status: AC
  Administered 2012-04-26: 12 mg via INTRAVENOUS

## 2012-04-26 MED ORDER — FLUOROURACIL CHEMO INJECTION 2.5 GM/50ML
500.0000 mg/m2 | Freq: Once | INTRAVENOUS | Status: AC
  Administered 2012-04-26: 950 mg via INTRAVENOUS
  Filled 2012-04-26: qty 19

## 2012-04-26 MED ORDER — SODIUM CHLORIDE 0.9 % IJ SOLN
10.0000 mL | INTRAMUSCULAR | Status: DC | PRN
  Administered 2012-04-26: 10 mL
  Filled 2012-04-26: qty 10

## 2012-04-26 NOTE — Telephone Encounter (Signed)
error 

## 2012-04-26 NOTE — Patient Instructions (Addendum)
[facility] Discharge Instructions for Patients Receiving Chemotherapy  Today you received the following chemotherapy agents Ellence,26fu,cytoxan.  To help prevent nausea and vomiting after your treatment, we encourage you to take your nausea medication as directed by your MD.      If you develop nausea and vomiting that is not controlled by your nausea medication, call the clinic. If it is after clinic hours your family physician or the after hours number for the clinic or go to the Emergency Department.   BELOW ARE SYMPTOMS THAT SHOULD BE REPORTED IMMEDIATELY:  *FEVER GREATER THAN 100.5 F  *CHILLS WITH OR WITHOUT FEVER  NAUSEA AND VOMITING THAT IS NOT CONTROLLED WITH YOUR NAUSEA MEDICATION  *UNUSUAL SHORTNESS OF BREATH  *UNUSUAL BRUISING OR BLEEDING  TENDERNESS IN MOUTH AND THROAT WITH OR WITHOUT PRESENCE OF ULCERS  *URINARY PROBLEMS  *BOWEL PROBLEMS  UNUSUAL RASH Items with * indicate a potential emergency and should be followed up as soon as possible.  If this is your first treatment one of the nurses will contact you Monday. Please let the nurse know about any problems that you may have experienced. Feel free to call the clinic you have any questions or concerns. The clinic phone number is [phone].

## 2012-04-27 ENCOUNTER — Ambulatory Visit (HOSPITAL_BASED_OUTPATIENT_CLINIC_OR_DEPARTMENT_OTHER): Payer: BC Managed Care – PPO

## 2012-04-27 VITALS — BP 130/72 | HR 82 | Temp 98.4°F

## 2012-04-27 DIAGNOSIS — C50419 Malignant neoplasm of upper-outer quadrant of unspecified female breast: Secondary | ICD-10-CM

## 2012-04-27 DIAGNOSIS — Z5189 Encounter for other specified aftercare: Secondary | ICD-10-CM

## 2012-04-27 DIAGNOSIS — C50919 Malignant neoplasm of unspecified site of unspecified female breast: Secondary | ICD-10-CM

## 2012-04-27 MED ORDER — PEGFILGRASTIM INJECTION 6 MG/0.6ML
6.0000 mg | Freq: Once | SUBCUTANEOUS | Status: AC
  Administered 2012-04-27: 6 mg via SUBCUTANEOUS

## 2012-04-29 ENCOUNTER — Telehealth: Payer: Self-pay | Admitting: *Deleted

## 2012-04-29 NOTE — Telephone Encounter (Signed)
Message left requesting a return call for f/u.

## 2012-04-29 NOTE — Telephone Encounter (Signed)
Message copied by Augusto Garbe on Mon Apr 29, 2012 11:18 AM ------      Message from: Faith Rogue F      Created: Fri Apr 26, 2012 11:28 AM      Regarding: chemo f/u call       Ellence, 5FU, Cytoxan      Dr. Pierce Crane

## 2012-05-03 ENCOUNTER — Telehealth: Payer: Self-pay | Admitting: *Deleted

## 2012-05-03 ENCOUNTER — Other Ambulatory Visit (HOSPITAL_BASED_OUTPATIENT_CLINIC_OR_DEPARTMENT_OTHER): Payer: BC Managed Care – PPO | Admitting: Lab

## 2012-05-03 DIAGNOSIS — C50419 Malignant neoplasm of upper-outer quadrant of unspecified female breast: Secondary | ICD-10-CM

## 2012-05-03 LAB — CBC WITH DIFFERENTIAL/PLATELET
BASO%: 0 % (ref 0.0–2.0)
Eosinophils Absolute: 0 10*3/uL (ref 0.0–0.5)
HCT: 29.8 % — ABNORMAL LOW (ref 34.8–46.6)
LYMPH%: 89.7 % — ABNORMAL HIGH (ref 14.0–49.7)
MCHC: 31.5 g/dL (ref 31.5–36.0)
MONO#: 0 10*3/uL — ABNORMAL LOW (ref 0.1–0.9)
NEUT#: 0 10*3/uL — CL (ref 1.5–6.5)
Platelets: 83 10*3/uL — ABNORMAL LOW (ref 145–400)
RBC: 3.56 10*6/uL — ABNORMAL LOW (ref 3.70–5.45)
WBC: 0.4 10*3/uL — CL (ref 3.9–10.3)
lymph#: 0.4 10*3/uL — ABNORMAL LOW (ref 0.9–3.3)
nRBC: 0 % (ref 0–0)

## 2012-05-03 LAB — COMPREHENSIVE METABOLIC PANEL (CC13)
ALT: 25 U/L (ref 0–55)
AST: 12 U/L (ref 5–34)
Albumin: 3 g/dL — ABNORMAL LOW (ref 3.5–5.0)
CO2: 29 mEq/L (ref 22–29)
Calcium: 8.7 mg/dL (ref 8.4–10.4)
Chloride: 103 mEq/L (ref 98–107)
Creatinine: 0.7 mg/dL (ref 0.6–1.1)
Potassium: 3.6 mEq/L (ref 3.5–5.1)
Total Protein: 6.1 g/dL — ABNORMAL LOW (ref 6.4–8.3)

## 2012-05-03 LAB — TECHNOLOGIST REVIEW

## 2012-05-03 LAB — TSH: TSH: 0.197 u[IU]/mL — ABNORMAL LOW (ref 0.350–4.500)

## 2012-05-03 MED ORDER — CIPROFLOXACIN HCL 500 MG PO TABS: 500.0000 mg | ORAL_TABLET | Freq: Two times a day (BID) | ORAL | Status: DC

## 2012-05-03 NOTE — Telephone Encounter (Signed)
Pt came in for labs for nadir check. Called pt to discuss noted neutropenia and precautions per this office. Recommended by covering MD antibiotic for nadir coverage.   Per inquiry pt denies fever,chills or cough.  Noted pt started menstrual cycle Tuesday with increased bleeding.  Per discussion pt understands above may related to recent chemotherapy-but to let this office know if period continues.

## 2012-05-04 ENCOUNTER — Other Ambulatory Visit: Payer: Self-pay | Admitting: Oncology

## 2012-05-05 NOTE — Progress Notes (Signed)
Hematology and Oncology Follow Up Visit  Nicole Valentine [id number] 03/27/1966 46 y.o. 05/05/2012 4:03 PM   DIAGNOSIS:   Encounter Diagnosis  Name Primary?  . Breast cancer, right, upper outer Yes     PAST THERAPY:    Interim History:  46 yo with recently diagnosed  triple negative breast cancer , due to start neoadjuvant chemotherapy. Pt is here to review staging exams   Medications: I have reviewed the patient's current medications.  Allergies:  Allergies  Allergen Reactions  . Almond Meal     *Pt states when she eats almonds, her lips "get itchy."    Past Medical History, Surgical history, Social history, and Family History were reviewed and updated.  Review of Systems: Constitutional:  Negative for fever, chills, night sweats, anorexia, weight loss, pain. Cardiovascular: no chest pain or dyspnea on exertion Respiratory: negative Neurological: negative Dermatological: negative ENT: negative Skin Gastrointestinal: negative Genito-Urinary: negative Hematological and Lymphatic: negative Breast: negative Musculoskeletal: negative Remaining ROS negative.  Physical Exam:  Blood pressure 115/72, pulse 70, temperature 98.2 F (36.8 C), resp. rate 20, height 5' 7.25" (1.708 m), weight 172 lb 4.8 oz (78.155 kg), last menstrual period 03/29/2012.  ECOG: 0   HEENT:  Sclerae anicteric, conjunctivae pink.  Oropharynx clear.  No mucositis or candidiasis.  Nodes:  No cervical, supraclavicular, or axillary lymphadenopathy palpated.  Breast Exam:  Right breast  Has a palpable mass in the superior portion of the breast., there is a port in place. Left breast is benign.  No masses, discharge, skin change, or nipple inversion..  Lungs:  Clear to auscultation bilaterally.  No crackles, rhonchi, or wheezes.  Heart:  Regular rate and rhythm.  Abdomen:  Soft, nontender.  Positive bowel sounds.  No organomegaly or masses palpated.  Musculoskeletal:  No focal spinal tenderness to  palpation.  Extremities:  Benign.  No peripheral edema or cyanosis.  Skin:  Benign.  Neuro:  Nonfocal.   Lab Results: Lab Results  Component Value Date   WBC 0.4* 05/03/2012   HGB 9.4* 05/03/2012   HCT 29.8* 05/03/2012   MCV 83.7 05/03/2012   PLT 83* 05/03/2012     Chemistry      Component Value Date/Time   NA 137 05/03/2012 1021   K 3.6 05/03/2012 1021   CL 103 05/03/2012 1021   CO2 29 05/03/2012 1021   BUN 13.0 05/03/2012 1021   CREATININE 0.7 05/03/2012 1021      Component Value Date/Time   CALCIUM 8.7 05/03/2012 1021   ALKPHOS 79 05/03/2012 1021   AST 12 05/03/2012 1021   ALT 25 05/03/2012 1021   BILITOT 0.69 05/03/2012 1021       Radiological Studies:  Mr Breast Bilateral W Wo Contrast  04/09/2012  *RADIOLOGY REPORT*  Clinical Data: Recent diagnosis of invasive ductal carcinoma of the right breast following ultrasound-guided biopsy of a right breast mass in the 11 o'clock position. Right axillary nodal metastatic disease was diagnosed following ultrasound-guided biopsy of a suspicious right axillary lymph node.  BILATERAL BREAST MRI WITH AND WITHOUT CONTRAST  Technique: Multiplanar, multisequence MR images of both breasts were obtained prior to and following the intravenous administration of 16ml of Multihance.  Three dimensional images were evaluated at the independent DynaCad workstation.  Comparison:  Bilateral mammogram 04/02/2012 and post clip mammogram of the right breast 04/05/2012.  Findings: There is a mild background parenchymal enhancement pattern in the left breast.  No mass or suspicious enhancement is identified in the left breast to suggest  malignancy.  The left nipple is a partially retracted,  similar in appearance to the left mammogram.  Question if this is a chronic finding for this patient.  There is markedly asymmetric and abnormal enhancement involving all four quadrants of the right breast.  Specifically, there is a dominant irregular mass in the  posterior third of the superior right breast centered in the 12 o'clock position, with washout kinetics, measuring 1.9 x 1.9 x 2.3 cm (transverse x AP x craniocaudal).  Biopsy clip artifact is seen slightly anterolateral to the dominant mass.  Additionally, the right breast has innumerable rounded to irregularly shaped nodular areas of enhancement very suspicious for multifocal and multicentric disease.  These nodular areas of enhancement are markedly asymmetric compared to enhancement pattern of the left breast.  Two of the largest masses include a 1.2 cm irregular mass in the medial right breast, middle third, near the level of the nipple. In the anterior third/subareolar right breast in the upper outer quadrant, a 6 mm nodule demonstrates plateau enhancement.  T2-weighted signal throughout the right breast is asymmetrically prominent compared to the left breast.  There are multiple enlarged level I right axillary lymph nodes, with the largest measuring 2.0 x 1.1 cm axial dimension.  There are also two asymmetrically prominent level II right axillary lymph nodes, the largest measuring 7 x 4 mm.  No internal mammary chain lymphadenopathy is identified.  There is a 7 mm oval T2 bright lesion in the dome of the liver.  IMPRESSION:  1.  Findings highly suspicious for multicentric right breast cancer, involving all four quadrants.  There are innumerable enhancing masses scattered throughout the right breast parenchyma, including a dominant mass in the 12 o'clock position of the right breast.  A second look ultrasound for consideration of biopsy of a second mass within the right breast should be considered to evaluate extent of disease/multicentricity.  If these masses are sonographically occult then MRI-guided biopsy could be performed, if desired. 2.  No MRI evidence of malignancy in the left breast. 3.  Pathologically enlarged level I right axillary lymph nodes consistent with biopsy-proven metastatic disease.   Asymmetrically prominent level II right axillary lymph nodes are suspicious for metastatic involvement. 3.  7 mm T2 bright lesion in the dome of the liver is statistically likely benign,  but correlation with cross sectional imaging of the abdomen pelvis could be considered.  RECOMMENDATION: Second look ultrasound of the right breast for possible biopsy of a second quadrant or anterior aspect of the breast to confirm extent of disease, as clinically indicated.  THREE-DIMENSIONAL MR IMAGE RENDERING ON INDEPENDENT WORKSTATION:  Three-dimensional MR images were rendered by post-processing of the original MR data on an independent workstation.  The three- dimensional MR images were interpreted, and findings were reported in the accompanying complete MRI report for this study.  BI-RADS CATEGORY 4:  Suspicious abnormality - biopsy should be considered.   Original Report Authenticated By: Britta Mccreedy, M.D.    Nm Pet Image Initial (pi) Skull Base To Thigh  04/19/2012  *RADIOLOGY REPORT*  Clinical Data: Initial treatment strategy for right breast invasive ductal cancer with known right axillary nodal metastases.  NUCLEAR MEDICINE PET SKULL BASE TO THIGH  Fasting Blood Glucose:  89  Technique:  17.2 mCi F-18 FDG was injected intravenously. CT data was obtained and used for attenuation correction and anatomic localization only.  (This was not acquired as a diagnostic CT examination.) Additional exam technical data entered on technologist worksheet.  Comparison:  Breast MRI 04/09/2012.  Findings:  Neck: Activity within [facility] ring is within physiologic limits. There is no hypermetabolic anterior cervical lymphadenopathy. There is mildly asymmetric hypermetabolic activity in the lower right neck between the trapezius and levator scapulae muscles. This appears to correspond with small lymph nodes on the CT images and has an SUV max of 3.3.  Chest:  Hypermetabolic activity within the right breast is greatest  superolaterally and has an SUV max of 6.2.  There are numerous hypermetabolic right axillary lymph nodes.  One of the largest nodes measures 2.2 x 1.5 cm on image 92 and has an SUV max of 8.7. There is no hypermetabolic internal mammary, mediastinal, hilar or left axillary nodal activity.  No abnormal activity is seen within the lungs.  Abdomen/Pelvis:  No abnormal hypermetabolic activity within the liver, pancreas, adrenal glands, or spleen.  The 7 mm lesion in the dome of the right hepatic lobe does not show any increased activity; this is probably a cyst or hemangioma based on its high T2 signal on prior MRI.  No hypermetabolic lymph nodes in the abdomen or pelvis.  There is a 4.3 x 2.8 cm low density left adnexal lesion on image #2 and 14 which does not show any increased metabolic activity.  Skeleton:  No focal hypermetabolic activity to suggest skeletal metastasis.  IMPRESSION:  1.  The patient's known metastatic right breast cancer is hypermetabolic with multiple areas of increased metabolic activity within the right breast and several enlarged right axillary lymph nodes. 2.  Small lymph nodes in the posterior right supraclavicular area are mildly hypermetabolic.  Early metastases cannot be excluded. 3.  No distant metastatic disease identified.  4.  Low density left adnexal lesion is likely a functional cyst of the left ovary and does not show any abnormal metabolic activity.   Original Report Authenticated By: Carey Bullocks, M.D.    Dg Chest Port 1 View  04/22/2012  *RADIOLOGY REPORT*  Clinical Data: History of breast carcinoma and status post Port-A- Cath placement.  PORTABLE CHEST - 1 VIEW  Comparison: None.  Findings: A left subclavian Port-A-Cath has been placed with the catheter tip at the cavoatrial junction.  No pneumothorax present. No pulmonary consolidation or pleural fluid.  IMPRESSION: Port-A-Cath tip lies at the cavoatrial junction. No pneumothorax identified.   Original Report  Authenticated By: Irish Lack, M.D.    Dg Fluoro Guide Cv Line-no Report  04/22/2012  CLINICAL DATA: breast cancer porta cath   FLOURO GUIDE CV LINE  Fluoroscopy was utilized by the requesting physician.  No radiographic  interpretation.       IMPRESSIONS AND PLAN: A 46 y.o. female with   A history of locally advanced triple negative cancer, due to start chemotherapy. The PET does not show evidence of distant mets and the EF is normal.I discussed the schedule with her and ensured she had her scripts. She will be seen 1 week after following chemo to check nadir counts.  Spent more than half the time coordinating care, as well as discussion of BMI and its implications.      Nicole Valentine 12/15/20134:03 PM Cell [phone]

## 2012-05-06 ENCOUNTER — Encounter (INDEPENDENT_AMBULATORY_CARE_PROVIDER_SITE_OTHER): Payer: Self-pay | Admitting: Surgery

## 2012-05-06 ENCOUNTER — Encounter (INDEPENDENT_AMBULATORY_CARE_PROVIDER_SITE_OTHER): Payer: BC Managed Care – PPO | Admitting: Surgery

## 2012-05-06 ENCOUNTER — Ambulatory Visit (INDEPENDENT_AMBULATORY_CARE_PROVIDER_SITE_OTHER): Payer: BC Managed Care – PPO | Admitting: Surgery

## 2012-05-06 ENCOUNTER — Telehealth: Payer: Self-pay | Admitting: *Deleted

## 2012-05-06 ENCOUNTER — Other Ambulatory Visit: Payer: Self-pay | Admitting: *Deleted

## 2012-05-06 VITALS — BP 101/60 | HR 84 | Temp 97.6°F | Resp 12 | Ht 67.5 in | Wt 164.6 lb

## 2012-05-06 DIAGNOSIS — Z09 Encounter for follow-up examination after completed treatment for conditions other than malignant neoplasm: Secondary | ICD-10-CM

## 2012-05-06 NOTE — Telephone Encounter (Signed)
This RN spoke with pt per call this am to triage RN.  Pt states she has no further spikes in temperature.  Per discussion and concern due to port site this RN recommended for pt to contact surgeon ( Dr Jamey Ripa ) . Nicole Valentine states she will call his office.  No further needs at this time.

## 2012-05-06 NOTE — Telephone Encounter (Signed)
PORT A CATH WAS SLIGHTLY RED AND OPEN IN THE MIDDLE OF THE INCISION. SPOKE TO A FRIEND WHO IS A NURSE. SHE INSTRUCTED PT. TO CLEAN AREA WITH PEROXIDE, APPLY VASELINE, AND COVER WITH GAUZE. PT. WOULD LIKE HER PAC CHECKED. ALSO ON 05/04/12 PT. "SPIKED" A TEMPERATURE OF 101.3. SHE CALLED THE ON CALL PHYSICIAN FOR THIS OFFICE. HE TOLD PT. TO MONITOR HER TEMPERATURE AND IF IT WENT TO 101.5 TO GO TO THE EMERGENCY ROOM. TEMPERATURE DID NOT GO ABOVE 101.3. NO FEVER ON 05/05/12. PT. HAD BEEN PLACED ON CIPRO ON 05/03/12 BY THIS OFFICE BECAUSE "HER COUNTS WERE LOW". PT. ALSO HAS AN "IRRITATED RED THROAT". "SHOULD PT. HAVE A STREP TEST DONE"? "WILL THIS ANTIBIOTIC TAKE CARE OF STREP THROAT"? THIS NOTE TO DR.RUBIN'S NURSE, Irma Newness.

## 2012-05-06 NOTE — Patient Instructions (Signed)
See me in a few months, but before you have your last chemo so we can make surgical plans

## 2012-05-06 NOTE — Progress Notes (Signed)
NAME: Nicole Valentine                                            DOB: 28-Oct-1965 DATE: 05/06/2012                                                  MRN: [id number]  CC: Post op   HPI: This patient comes in for post op follow-up .Sheunderwent PAC placement on 11/20. She feels that she is doing well.but was concerned that the incision had opened up. The "glue" came off with her chemo treatement  PE:  VITAL SIGNS: BP 101/60  Pulse 84  Temp 97.6 F (36.4 C) (Temporal)  Resp 12  Ht 5' 7.5" (1.715 m)  Wt 164 lb 9.6 oz (74.662 kg)  BMI 25.40 kg/m2  LMP 03/29/2012  General: The patient appears to be healthy, NAD Wound is OK. There is minimal epidermal separation laterally which is the knot froom the Monocryl suture. Not infected  DATA REVIEWED: cxr POST OP WAS ok  IMPRESSION: The patient is doing well S/P PAC placement.    PLAN: See me near completion of chemo. Applied additional Dermabond

## 2012-05-07 ENCOUNTER — Other Ambulatory Visit: Payer: BC Managed Care – PPO | Admitting: Lab

## 2012-05-07 ENCOUNTER — Ambulatory Visit: Payer: BC Managed Care – PPO | Admitting: Oncology

## 2012-05-08 ENCOUNTER — Encounter: Payer: Self-pay | Admitting: Oncology

## 2012-05-08 NOTE — Progress Notes (Signed)
Completed FMLA papers.  Pt will pick them up from the receptionist on her next visit on 05/10/12.

## 2012-05-09 ENCOUNTER — Other Ambulatory Visit: Payer: Self-pay | Admitting: *Deleted

## 2012-05-09 DIAGNOSIS — C50419 Malignant neoplasm of upper-outer quadrant of unspecified female breast: Secondary | ICD-10-CM

## 2012-05-10 ENCOUNTER — Other Ambulatory Visit (HOSPITAL_BASED_OUTPATIENT_CLINIC_OR_DEPARTMENT_OTHER): Payer: BC Managed Care – PPO | Admitting: Lab

## 2012-05-10 ENCOUNTER — Other Ambulatory Visit: Payer: Self-pay | Admitting: *Deleted

## 2012-05-10 ENCOUNTER — Ambulatory Visit (HOSPITAL_BASED_OUTPATIENT_CLINIC_OR_DEPARTMENT_OTHER): Payer: BC Managed Care – PPO | Admitting: Oncology

## 2012-05-10 ENCOUNTER — Ambulatory Visit (HOSPITAL_BASED_OUTPATIENT_CLINIC_OR_DEPARTMENT_OTHER): Payer: BC Managed Care – PPO

## 2012-05-10 ENCOUNTER — Other Ambulatory Visit: Payer: Self-pay | Admitting: Oncology

## 2012-05-10 VITALS — BP 96/62 | HR 80 | Temp 98.4°F | Resp 20 | Ht 67.5 in | Wt 165.4 lb

## 2012-05-10 DIAGNOSIS — N390 Urinary tract infection, site not specified: Secondary | ICD-10-CM

## 2012-05-10 DIAGNOSIS — C50419 Malignant neoplasm of upper-outer quadrant of unspecified female breast: Secondary | ICD-10-CM

## 2012-05-10 DIAGNOSIS — R5081 Fever presenting with conditions classified elsewhere: Secondary | ICD-10-CM

## 2012-05-10 DIAGNOSIS — Z5111 Encounter for antineoplastic chemotherapy: Secondary | ICD-10-CM

## 2012-05-10 DIAGNOSIS — C50919 Malignant neoplasm of unspecified site of unspecified female breast: Secondary | ICD-10-CM

## 2012-05-10 DIAGNOSIS — D709 Neutropenia, unspecified: Secondary | ICD-10-CM

## 2012-05-10 DIAGNOSIS — E611 Iron deficiency: Secondary | ICD-10-CM

## 2012-05-10 DIAGNOSIS — C773 Secondary and unspecified malignant neoplasm of axilla and upper limb lymph nodes: Secondary | ICD-10-CM

## 2012-05-10 LAB — CBC WITH DIFFERENTIAL/PLATELET
Basophils Absolute: 0 10*3/uL (ref 0.0–0.1)
Eosinophils Absolute: 0 10*3/uL (ref 0.0–0.5)
HGB: 9.5 g/dL — ABNORMAL LOW (ref 11.6–15.9)
MCV: 85.4 fL (ref 79.5–101.0)
MONO#: 0.3 10*3/uL (ref 0.1–0.9)
MONO%: 7.7 % (ref 0.0–14.0)
NEUT#: 2.7 10*3/uL (ref 1.5–6.5)
Platelets: 261 10*3/uL (ref 145–400)
RDW: 20.8 % — ABNORMAL HIGH (ref 11.2–14.5)
WBC: 4.3 10*3/uL (ref 3.9–10.3)

## 2012-05-10 LAB — COMPREHENSIVE METABOLIC PANEL (CC13)
ALT: 16 U/L (ref 0–55)
Albumin: 2.9 g/dL — ABNORMAL LOW (ref 3.5–5.0)
CO2: 33 mEq/L — ABNORMAL HIGH (ref 22–29)
Calcium: 8.8 mg/dL (ref 8.4–10.4)
Chloride: 102 mEq/L (ref 98–107)
Glucose: 93 mg/dl (ref 70–99)
Sodium: 141 mEq/L (ref 136–145)
Total Bilirubin: <0.2 mg/dL (ref 0.20–1.20)
Total Protein: 6.1 g/dL — ABNORMAL LOW (ref 6.4–8.3)

## 2012-05-10 LAB — LACTATE DEHYDROGENASE (CC13): LDH: 141 U/L (ref 125–245)

## 2012-05-10 MED ORDER — SODIUM CHLORIDE 0.9 % IJ SOLN
10.0000 mL | INTRAMUSCULAR | Status: DC | PRN
  Administered 2012-05-10: 10 mL
  Filled 2012-05-10: qty 10

## 2012-05-10 MED ORDER — SODIUM CHLORIDE 0.9 % IV SOLN
500.0000 mg/m2 | Freq: Once | INTRAVENOUS | Status: AC
  Administered 2012-05-10: 960 mg via INTRAVENOUS
  Filled 2012-05-10: qty 48

## 2012-05-10 MED ORDER — PALONOSETRON HCL INJECTION 0.25 MG/5ML
0.2500 mg | Freq: Once | INTRAVENOUS | Status: AC
  Administered 2012-05-10: 0.25 mg via INTRAVENOUS

## 2012-05-10 MED ORDER — FLUOROURACIL CHEMO INJECTION 2.5 GM/50ML
500.0000 mg/m2 | Freq: Once | INTRAVENOUS | Status: AC
  Administered 2012-05-10: 950 mg via INTRAVENOUS
  Filled 2012-05-10: qty 19

## 2012-05-10 MED ORDER — SODIUM CHLORIDE 0.9 % IV SOLN
Freq: Once | INTRAVENOUS | Status: AC
  Administered 2012-05-10: 13:00:00 via INTRAVENOUS

## 2012-05-10 MED ORDER — HEPARIN SOD (PORK) LOCK FLUSH 100 UNIT/ML IV SOLN
500.0000 [IU] | Freq: Once | INTRAVENOUS | Status: AC | PRN
  Administered 2012-05-10: 500 [IU]
  Filled 2012-05-10: qty 5

## 2012-05-10 MED ORDER — DEXAMETHASONE SODIUM PHOSPHATE 4 MG/ML IJ SOLN
12.0000 mg | Freq: Once | INTRAMUSCULAR | Status: AC
  Administered 2012-05-10: 12 mg via INTRAVENOUS

## 2012-05-10 MED ORDER — EPIRUBICIN HCL CHEMO IV INJECTION 200 MG/100ML
100.0000 mg/m2 | Freq: Once | INTRAVENOUS | Status: AC
  Administered 2012-05-10: 192 mg via INTRAVENOUS
  Filled 2012-05-10: qty 96

## 2012-05-10 MED ORDER — SODIUM CHLORIDE 0.9 % IV SOLN
150.0000 mg | Freq: Once | INTRAVENOUS | Status: AC
  Administered 2012-05-10: 150 mg via INTRAVENOUS
  Filled 2012-05-10: qty 5

## 2012-05-10 NOTE — Patient Instructions (Addendum)
[facility] Discharge Instructions for Patients Receiving Chemotherapy  Today you received the following chemotherapy agents epirubicin/adria/cytoxan  To help prevent nausea and vomiting after your treatment, we encourage you to take your nausea medication and take it as often as prescribed   If you develop nausea and vomiting that is not controlled by your nausea medication, call the clinic. If it is after clinic hours your family physician or the after hours number for the clinic or go to the Emergency Department.   BELOW ARE SYMPTOMS THAT SHOULD BE REPORTED IMMEDIATELY:  *FEVER GREATER THAN 100.5 F  *CHILLS WITH OR WITHOUT FEVER  NAUSEA AND VOMITING THAT IS NOT CONTROLLED WITH YOUR NAUSEA MEDICATION  *UNUSUAL SHORTNESS OF BREATH  *UNUSUAL BRUISING OR BLEEDING  TENDERNESS IN MOUTH AND THROAT WITH OR WITHOUT PRESENCE OF ULCERS  *URINARY PROBLEMS  *BOWEL PROBLEMS  UNUSUAL RASH Items with * indicate a potential emergency and should be followed up as soon as possible.  One of the nurses will contact you 24 hours after your treatment. Please let the nurse know about any problems that you may have experienced. Feel free to call the clinic you have any questions or concerns. The clinic phone number is [phone].   I have been informed and understand all the instructions given to me. I know to contact the clinic, my physician, or go to the Emergency Department if any problems should occur. I do not have any questions at this time, but understand that I may call the clinic during office hours   should I have any questions or need assistance in obtaining follow up care.    __________________________________________  _____________  __________ Signature of Patient or Authorized Representative            Date                   Time    __________________________________________ Nurse's Signature

## 2012-05-10 NOTE — Progress Notes (Signed)
Hematology and Oncology Follow Up Visit  Nicole Valentine [id number] 02/24/1966 46 y.o. 05/10/2012 1:00 PM   DIAGNOSIS:   Encounter Diagnoses  Name Primary?  . Breast cancer Yes  . Iron deficiency      PAST THERAPY:   Locally advanced [place] breast cancer receiving neoadjuvant chemotherapy  S/p C1 ddFEC , day 1 C2.  Interim History:  1st cycle was relatively uncomplicated, she developed a fever and was neutropenic on day 10, she started on cipro . Other issue was mild mouth sores. She did not require hospitlization, peak temp was 101.3 She feels well today, she thinks her breast mass is smaller.  Medications: I have reviewed the patient's current medications.  Allergies:  Allergies  Allergen Reactions  . Almond Meal     *Pt states when she eats almonds, her lips "get itchy."    Past Medical History, Surgical history, Social history, and Family History were reviewed and updated.  Review of Systems: Constitutional:  Negative for fever, chills, night sweats, anorexia, weight loss, pain. Cardiovascular: negative Respiratory: negative Neurological: negative Dermatological: negative ENT: negative Skin Gastrointestinal: no abdominal pain, change in bowel habits, or black or bloody stools Genito-Urinary: negative Hematological and Lymphatic: negative Breast: negative Musculoskeletal: negative Remaining ROS negative.  Physical Exam:  Blood pressure 96/62, pulse 80, temperature 98.4 F (36.9 C), temperature source Oral, resp. rate 20, height 5' 7.5" (1.715 m), weight 165 lb 6.4 oz (75.025 kg).  ECOG: 0   HEENT:  Sclerae anicteric, conjunctivae pink.  Oropharynx clear.  No mucositis or candidiasis.  Nodes:  No cervical, supraclavicular, or axillary lymphadenopathy palpated.  Breast Exam:  Right breast has a mass at approximately 9 oclock which is smaller and softer on todays exam.  Left breast is benign.  No masses, discharge, skin change, or nipple inversion..  Lungs:   Clear to auscultation bilaterally.  No crackles, rhonchi, or wheezes.  Heart:  Regular rate and rhythm.  Abdomen:  Soft, nontender.  Positive bowel sounds.  No organomegaly or masses palpated.  Musculoskeletal:  No focal spinal tenderness to palpation.  Extremities:  Benign.  No peripheral edema or cyanosis.  Skin:  Benign.  Neuro:  Nonfocal.    Lab Results: Lab Results  Component Value Date   WBC 4.3 05/10/2012   HGB 9.5* 05/10/2012   HCT 29.1* 05/10/2012   MCV 85.4 05/10/2012   PLT 261 05/10/2012     Chemistry      Component Value Date/Time   NA 141 05/10/2012 1112   K 4.1 05/10/2012 1112   CL 102 05/10/2012 1112   CO2 33* 05/10/2012 1112   BUN 11.0 05/10/2012 1112   CREATININE 0.7 05/10/2012 1112      Component Value Date/Time   CALCIUM 8.8 05/10/2012 1112   ALKPHOS 92 05/10/2012 1112   AST 12 05/10/2012 1112   ALT 16 05/10/2012 1112   BILITOT <0.20 Repeated and Verified 05/10/2012 1112       Radiological Studies:  Nm Pet Image Initial (pi) Skull Base To Thigh  04/19/2012  *RADIOLOGY REPORT*  Clinical Data: Initial treatment strategy for right breast invasive ductal cancer with known right axillary nodal metastases.  NUCLEAR MEDICINE PET SKULL BASE TO THIGH  Fasting Blood Glucose:  89  Technique:  17.2 mCi F-18 FDG was injected intravenously. CT data was obtained and used for attenuation correction and anatomic localization only.  (This was not acquired as a diagnostic CT examination.) Additional exam technical data entered on technologist worksheet.  Comparison:  Breast MRI  04/09/2012.  Findings:  Neck: Activity within [facility] (1-Rh) ring is within physiologic limits. There is no hypermetabolic anterior cervical lymphadenopathy. There is mildly asymmetric hypermetabolic activity in the lower right neck between the trapezius and levator scapulae muscles. This appears to correspond with small lymph nodes on the CT images and has an SUV max of 3.3.  Chest:  Hypermetabolic activity  within the right breast is greatest superolaterally and has an SUV max of 6.2.  There are numerous hypermetabolic right axillary lymph nodes.  One of the largest nodes measures 2.2 x 1.5 cm on image 92 and has an SUV max of 8.7. There is no hypermetabolic internal mammary, mediastinal, hilar or left axillary nodal activity.  No abnormal activity is seen within the lungs.  Abdomen/Pelvis:  No abnormal hypermetabolic activity within the liver, pancreas, adrenal glands, or spleen.  The 7 mm lesion in the dome of the right hepatic lobe does not show any increased activity; this is probably a cyst or hemangioma based on its high T2 signal on prior MRI.  No hypermetabolic lymph nodes in the abdomen or pelvis.  There is a 4.3 x 2.8 cm low density left adnexal lesion on image #2 and 14 which does not show any increased metabolic activity.  Skeleton:  No focal hypermetabolic activity to suggest skeletal metastasis.  IMPRESSION:  1.  The patient's known metastatic right breast cancer is hypermetabolic with multiple areas of increased metabolic activity within the right breast and several enlarged right axillary lymph nodes. 2.  Small lymph nodes in the posterior right supraclavicular area are mildly hypermetabolic.  Early metastases cannot be excluded. 3.  No distant metastatic disease identified.  4.  Low density left adnexal lesion is likely a functional cyst of the left ovary and does not show any abnormal metabolic activity.   Original Report Authenticated By: Carey Bullocks, M.D.    Dg Chest Port 1 View  04/22/2012  *RADIOLOGY REPORT*  Clinical Data: History of breast carcinoma and status post Port-A- Cath placement.  PORTABLE CHEST - 1 VIEW  Comparison: None.  Findings: A left subclavian Port-A-Cath has been placed with the catheter tip at the cavoatrial junction.  No pneumothorax present. No pulmonary consolidation or pleural fluid.  IMPRESSION: Port-A-Cath tip lies at the cavoatrial junction. No pneumothorax  identified.   Original Report Authenticated By: Irish Lack, M.D.    Dg Fluoro Guide Cv Line-no Report  04/22/2012  CLINICAL DATA: breast cancer porta cath   FLOURO GUIDE CV LINE  Fluoroscopy was utilized by the requesting physician.  No radiographic  interpretation.       IMPRESSIONS AND PLAN: A 46 y.o. female with   History of locally advanced [place] breast cancer, due for D1 c2 dd FEC. Given hx of fever and neutropenia, I have asked her to start cipro on day 5 for 7 dayts. She will call for fever etc.  Spent more than half the time coordinating care, as well as discussion of BMI and its implications.      Audri Kozub 12/20/20131:00 PM Cell [phone]

## 2012-05-11 ENCOUNTER — Telehealth: Payer: Self-pay | Admitting: *Deleted

## 2012-05-11 ENCOUNTER — Ambulatory Visit (HOSPITAL_BASED_OUTPATIENT_CLINIC_OR_DEPARTMENT_OTHER): Payer: BC Managed Care – PPO

## 2012-05-11 VITALS — BP 115/71 | HR 66 | Temp 97.6°F

## 2012-05-11 DIAGNOSIS — Z5189 Encounter for other specified aftercare: Secondary | ICD-10-CM

## 2012-05-11 DIAGNOSIS — C50919 Malignant neoplasm of unspecified site of unspecified female breast: Secondary | ICD-10-CM

## 2012-05-11 DIAGNOSIS — C50419 Malignant neoplasm of upper-outer quadrant of unspecified female breast: Secondary | ICD-10-CM

## 2012-05-11 DIAGNOSIS — C773 Secondary and unspecified malignant neoplasm of axilla and upper limb lymph nodes: Secondary | ICD-10-CM

## 2012-05-11 LAB — IRON AND TIBC
%SAT: 24 % (ref 20–55)
Iron: 56 ug/dL (ref 42–145)
TIBC: 232 ug/dL — ABNORMAL LOW (ref 250–470)
UIBC: 176 ug/dL (ref 125–400)

## 2012-05-11 LAB — CANCER ANTIGEN 27.29: CA 27.29: 43 U/mL — ABNORMAL HIGH (ref 0–39)

## 2012-05-11 LAB — FERRITIN: Ferritin: 62 ng/mL (ref 10–291)

## 2012-05-11 MED ORDER — PEGFILGRASTIM INJECTION 6 MG/0.6ML
6.0000 mg | Freq: Once | SUBCUTANEOUS | Status: AC
  Administered 2012-05-11: 6 mg via SUBCUTANEOUS

## 2012-05-11 NOTE — Telephone Encounter (Signed)
Patient returned my call and I informed her that Dr. Donnie Coffin would no longer be here with our practice as of 05/22/12 and she was already aware.  Answered all questions at this time.  Confirmed 05/24/12 appt w /pt.

## 2012-05-11 NOTE — Patient Instructions (Signed)
Call MD for problems 

## 2012-05-11 NOTE — Telephone Encounter (Signed)
Left message for pt to return my call on Monday concerning her upcoming appt. 

## 2012-05-14 ENCOUNTER — Other Ambulatory Visit: Payer: Self-pay | Admitting: *Deleted

## 2012-05-14 DIAGNOSIS — C50419 Malignant neoplasm of upper-outer quadrant of unspecified female breast: Secondary | ICD-10-CM

## 2012-05-14 DIAGNOSIS — N390 Urinary tract infection, site not specified: Secondary | ICD-10-CM

## 2012-05-24 ENCOUNTER — Telehealth: Payer: Self-pay | Admitting: Oncology

## 2012-05-24 ENCOUNTER — Ambulatory Visit (HOSPITAL_BASED_OUTPATIENT_CLINIC_OR_DEPARTMENT_OTHER): Payer: BC Managed Care – PPO | Admitting: Oncology

## 2012-05-24 ENCOUNTER — Encounter: Payer: Self-pay | Admitting: Oncology

## 2012-05-24 ENCOUNTER — Ambulatory Visit (HOSPITAL_BASED_OUTPATIENT_CLINIC_OR_DEPARTMENT_OTHER): Payer: BC Managed Care – PPO

## 2012-05-24 ENCOUNTER — Other Ambulatory Visit (HOSPITAL_BASED_OUTPATIENT_CLINIC_OR_DEPARTMENT_OTHER): Payer: BC Managed Care – PPO | Admitting: Lab

## 2012-05-24 ENCOUNTER — Ambulatory Visit: Payer: BC Managed Care – PPO | Admitting: Oncology

## 2012-05-24 ENCOUNTER — Other Ambulatory Visit: Payer: BC Managed Care – PPO | Admitting: Lab

## 2012-05-24 VITALS — BP 120/75 | HR 74 | Temp 98.4°F | Resp 20 | Ht 67.5 in | Wt 161.7 lb

## 2012-05-24 DIAGNOSIS — C773 Secondary and unspecified malignant neoplasm of axilla and upper limb lymph nodes: Secondary | ICD-10-CM

## 2012-05-24 DIAGNOSIS — E860 Dehydration: Secondary | ICD-10-CM

## 2012-05-24 DIAGNOSIS — D709 Neutropenia, unspecified: Secondary | ICD-10-CM

## 2012-05-24 DIAGNOSIS — C50919 Malignant neoplasm of unspecified site of unspecified female breast: Secondary | ICD-10-CM

## 2012-05-24 DIAGNOSIS — C50419 Malignant neoplasm of upper-outer quadrant of unspecified female breast: Secondary | ICD-10-CM

## 2012-05-24 DIAGNOSIS — Z5111 Encounter for antineoplastic chemotherapy: Secondary | ICD-10-CM

## 2012-05-24 LAB — COMPREHENSIVE METABOLIC PANEL (CC13)
AST: 15 U/L (ref 5–34)
Alkaline Phosphatase: 86 U/L (ref 40–150)
BUN: 7 mg/dL (ref 7.0–26.0)
Creatinine: 0.7 mg/dL (ref 0.6–1.1)
Potassium: 3.9 mEq/L (ref 3.5–5.1)

## 2012-05-24 LAB — CBC WITH DIFFERENTIAL/PLATELET
BASO%: 0.8 % (ref 0.0–2.0)
EOS%: 0 % (ref 0.0–7.0)
HCT: 29.7 % — ABNORMAL LOW (ref 34.8–46.6)
LYMPH%: 30.4 % (ref 14.0–49.7)
MCH: 27.3 pg (ref 25.1–34.0)
MCHC: 32 g/dL (ref 31.5–36.0)
NEUT%: 58.2 % (ref 38.4–76.8)
RBC: 3.48 10*6/uL — ABNORMAL LOW (ref 3.70–5.45)
lymph#: 1.5 10*3/uL (ref 0.9–3.3)
nRBC: 0 % (ref 0–0)

## 2012-05-24 LAB — CANCER ANTIGEN 27.29: CA 27.29: 34 U/mL (ref 0–39)

## 2012-05-24 MED ORDER — FLUOROURACIL CHEMO INJECTION 2.5 GM/50ML
500.0000 mg/m2 | Freq: Once | INTRAVENOUS | Status: AC
  Administered 2012-05-24: 950 mg via INTRAVENOUS
  Filled 2012-05-24: qty 19

## 2012-05-24 MED ORDER — DEXAMETHASONE SODIUM PHOSPHATE 10 MG/ML IJ SOLN
10.0000 mg | Freq: Once | INTRAMUSCULAR | Status: AC
  Administered 2012-05-24: 10 mg via INTRAVENOUS

## 2012-05-24 MED ORDER — SODIUM CHLORIDE 0.9 % IV SOLN
500.0000 mg/m2 | Freq: Once | INTRAVENOUS | Status: AC
  Administered 2012-05-24: 960 mg via INTRAVENOUS
  Filled 2012-05-24: qty 48

## 2012-05-24 MED ORDER — SODIUM CHLORIDE 0.9 % IJ SOLN
10.0000 mL | INTRAMUSCULAR | Status: DC | PRN
  Administered 2012-05-24: 10 mL
  Filled 2012-05-24: qty 10

## 2012-05-24 MED ORDER — HEPARIN SOD (PORK) LOCK FLUSH 100 UNIT/ML IV SOLN
500.0000 [IU] | Freq: Once | INTRAVENOUS | Status: AC | PRN
  Administered 2012-05-24: 500 [IU]
  Filled 2012-05-24: qty 5

## 2012-05-24 MED ORDER — SODIUM CHLORIDE 0.9 % IV SOLN
150.0000 mg | Freq: Once | INTRAVENOUS | Status: AC
  Administered 2012-05-24: 150 mg via INTRAVENOUS
  Filled 2012-05-24: qty 5

## 2012-05-24 MED ORDER — PALONOSETRON HCL INJECTION 0.25 MG/5ML
0.2500 mg | Freq: Once | INTRAVENOUS | Status: AC
  Administered 2012-05-24: 0.25 mg via INTRAVENOUS

## 2012-05-24 MED ORDER — SODIUM CHLORIDE 0.9 % IV SOLN
Freq: Once | INTRAVENOUS | Status: AC
  Administered 2012-05-24: 14:00:00 via INTRAVENOUS

## 2012-05-24 MED ORDER — EPIRUBICIN HCL CHEMO IV INJECTION 200 MG/100ML
100.0000 mg/m2 | Freq: Once | INTRAVENOUS | Status: AC
  Administered 2012-05-24: 192 mg via INTRAVENOUS
  Filled 2012-05-24: qty 96

## 2012-05-24 NOTE — Patient Instructions (Addendum)
Proceed with chemotherapy  Return in 1 week for follow up

## 2012-05-24 NOTE — Patient Instructions (Addendum)
[facility] Discharge Instructions for Patients Receiving Chemotherapy  Today you received the following chemotherapy agents Ellence,cytoxan , 21fu To help prevent nausea and vomiting after your treatment, we encourage you to take your nausea medication   Take it as often as prescribed.   If you develop nausea and vomiting that is not controlled by your nausea medication, call the clinic. If it is after clinic hours your family physician or the after hours number for the clinic or go to the Emergency Department.   BELOW ARE SYMPTOMS THAT SHOULD BE REPORTED IMMEDIATELY:  *FEVER GREATER THAN 100.5 F  *CHILLS WITH OR WITHOUT FEVER  NAUSEA AND VOMITING THAT IS NOT CONTROLLED WITH YOUR NAUSEA MEDICATION  *UNUSUAL SHORTNESS OF BREATH  *UNUSUAL BRUISING OR BLEEDING  TENDERNESS IN MOUTH AND THROAT WITH OR WITHOUT PRESENCE OF ULCERS  *URINARY PROBLEMS  *BOWEL PROBLEMS  UNUSUAL RASH Items with * indicate a potential emergency and should be followed up as soon as possible.  . Feel free to call the clinic you have any questions or concerns. The clinic phone number is [phone].   I have been informed and understand all the instructions given to me. I know to contact the clinic, my physician, or go to the Emergency Department if any problems should occur. I do not have any questions at this time, but understand that I may call the clinic during office hours   should I have any questions or need assistance in obtaining follow up care.    __________________________________________  _____________  __________ Signature of Patient or Authorized Representative            Date                   Time    __________________________________________ Nurse's Signature    [facility] Discharge Instructions for Patients Receiving Chemotherapy  Today you received the following chemotherapy agents Epirubicin, cytoxan and 30fu.  To help prevent nausea and  vomiting after your treatment, we encourage you to take your nausea medication . Begin taking it  tonight and take it as often as prescribed..   If you develop nausea and vomiting that is not controlled by your nausea medication, call the clinic. If it is after clinic hours your family physician or the after hours number for the clinic or go to the Emergency Department.   BELOW ARE SYMPTOMS THAT SHOULD BE REPORTED IMMEDIATELY:  *FEVER GREATER THAN 100.5 F  *CHILLS WITH OR WITHOUT FEVER  NAUSEA AND VOMITING THAT IS NOT CONTROLLED WITH YOUR NAUSEA MEDICATION  *UNUSUAL SHORTNESS OF BREATH  *UNUSUAL BRUISING OR BLEEDING  TENDERNESS IN MOUTH AND THROAT WITH OR WITHOUT PRESENCE OF ULCERS  *URINARY PROBLEMS  *BOWEL PROBLEMS  UNUSUAL RASH Items with * indicate a potential emergency and should be followed up as soon as possible.  Feel free to call the clinic you have any questions or concerns. The clinic phone number is [phone].   I have been informed and understand all the instructions given to me. I know to contact the clinic, my physician, or go to the Emergency Department if any problems should occur. I do not have any questions at this time, but understand that I may call the clinic during office hours   should I have any questions or need assistance in obtaining follow up care.    __________________________________________  _____________  __________ Signature of Patient or Authorized Representative  Date                   Time    __________________________________________ Nurse's Signature

## 2012-05-24 NOTE — Telephone Encounter (Signed)
gv pt appt schedule for January and February. Per pt request gv KK the message re added TSH to her lab draw. Per KK she will take care of it. LA/KK have no availability for f/u 1/10 or 1/17. Message to KK. Pt aware she will be contacted re lb/fu appts for 1/10 and 1/17.

## 2012-05-24 NOTE — Progress Notes (Signed)
OFFICE PROGRESS NOTE  CC  [name], MD [address] [place] [address] Dr. Fatima Sanger Dr. 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 Diagnosed November 2013. Clinical stage II (T2 N1)  PRIOR THERAPY:  #1 patient originally presented to the multidisciplinary breast clinic on 04/10/2012. She originally palpated a right breast mass. She had a mammogram performed that showed dense breasts bilaterally. Ultrasound of the right breast showed 2.3 x 1.9 cm area of abnormality with multiple abnormal lymph nodes. MRI of the bilateral breasts showed asymmetrical enhancement throughout the right breast consistent with multicentric disease. An ultrasound-guided biopsy performed. The known area of disease measured 1.9 x 1.9 x 2.3 cm. Multiple abnormal positive right axillary lymph nodes were noted. 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 Biopsy of the right axillary lymph node was positive for malignancy with extracapsular extension. Patient was originally seen by Dr. Jamey Ripa Dr. Donnie Coffin and Dr. Michell Heinrich. She elected to have a right mastectomy and declined I opt sees of any other areas within the breast.  #2 after extensive discussion patient initially was started on neoadjuvant chemotherapy by Dr. Donnie Coffin. He began her on neoadjuvant FEC given dose dense every 2 weeks with day 2 Neulasta. For 4 cycles.Followed by dose dense Taxotere every 2 weeks for a total of 4 cycles. She began her chemotherapy on 04/26/2012.  CURRENT THERAPY:Patient is here for cycle 3 of FEC with day 2 Neulasta  INTERVAL HISTORY: Nicole Valentine 48 y.o. female returns for Followup visit and Dr. Renelda Loma absence. She is now going to be following me. Overall patient is doing well she does develop fevers  off-and-on and neutropenia. She is tired. She also looks dehydrated. She otherwise denies any nausea vomiting fevers she has no chills no night sweats. She has no peripheral paresthesias no hematuria hematochezia melena hemoptysis or hematemesis. Remainder of the 10 point review of systems is negative.  MEDICAL HISTORY: Past Medical History  Diagnosis Date  . Breast cancer   . Thyroid disease   . Hypothyroidism   . Contact lens/glasses fitting     wears contacts or glasses  . Complication of anesthesia     her father and uncle both had very difficult time waking up-was  something they told them may be hereditory. she will find out.    ALLERGIES:  is allergic to almond meal.  MEDICATIONS:  Current Outpatient Prescriptions  Medication Sig Dispense Refill  . dexamethasone (DECADRON) 4 MG tablet Take 2 tablets by mouth once a day on the day after chemotherapy and then take 2 tablets two times a day for 2 days. Take with food.  30 tablet  1  . Ferrous Sulfate Dried (SLOW RELEASE IRON) 45 MG TBCR Take by mouth daily.      [name] HYDROcodone-acetaminophen (NORCO) 5-325 MG per tablet Take 1 tablet by mouth every 4 (four) hours as needed for pain.  30 tablet  0  . levothyroxine (SYNTHROID, LEVOTHROID) 200 MCG tablet       . lidocaine-prilocaine (EMLA) cream Apply topically as needed.  30 g  0  . LORazepam (ATIVAN) 0.5 MG tablet Take 1 tablet (0.5 mg total) by mouth every 6 (six) hours as needed (Nausea or vomiting).  30 tablet  0  . ondansetron (ZOFRAN) 8 MG tablet Take 1 tablet (8  mg total) by mouth 2 (two) times daily as needed. Take two times a day as needed for nausea or vomiting starting on the third day after chemotherapy.  30 tablet  1  . prochlorperazine (COMPAZINE) 10 MG tablet Take 1 tablet (10 mg total) by mouth every 6 (six) hours as needed (Nausea or vomiting).  30 tablet  1  . prochlorperazine (COMPAZINE) 25 MG suppository Place 1 suppository (25 mg total) rectally every 12 (twelve) hours  as needed for nausea.  12 suppository  3    SURGICAL HISTORY:  Past Surgical History  Procedure Date  . Wisdom tooth extraction   . Portacath placement 04/22/2012    Procedure: INSERTION PORT-A-CATH;  Surgeon: Currie Paris, MD;  Location: [facility];  Service: General;  Laterality: Left;    REVIEW OF SYSTEMS:  Pertinent items are noted in HPI.   HEALTH MAINTENANCE:   PHYSICAL EXAMINATION: Blood pressure 120/75, pulse 74, temperature 98.4 F (36.9 C), resp. rate 20, height 5' 7.5" (1.715 m), weight 161 lb 11.2 oz (73.347 kg). Body mass index is 24.95 kg/(m^2). ECOG PERFORMANCE STATUS: 0 - Asymptomatic   General appearance: alert, cooperative and appears stated age Lymph nodes: Cervical, supraclavicular, and axillary nodes normal. Resp: clear to auscultation bilaterally Back: symmetric, no curvature. ROM normal. No CVA tenderness. Cardio: regular rate and rhythm GI: soft, non-tender; bowel sounds normal; no masses,  no organomegaly Extremities: extremities normal, atraumatic, no cyanosis or edema Neurologic: Grossly normal Right breast still does reveal a palpable mass no axillary masses are noted left breast no masses or nipple discharge  LABORATORY DATA: Lab Results  Component Value Date   WBC 4.3 05/10/2012   HGB 9.5* 05/10/2012   HCT 29.1* 05/10/2012   MCV 85.4 05/10/2012   PLT 261 05/10/2012      Chemistry      Component Value Date/Time   NA 141 05/10/2012 1112   K 4.1 05/10/2012 1112   CL 102 05/10/2012 1112   CO2 33* 05/10/2012 1112   BUN 11.0 05/10/2012 1112   CREATININE 0.7 05/10/2012 1112      Component Value Date/Time   CALCIUM 8.8 05/10/2012 1112   ALKPHOS 92 05/10/2012 1112   AST 12 05/10/2012 1112   ALT 16 05/10/2012 1112   BILITOT <0.20 Repeated and Verified 05/10/2012 1112       RADIOGRAPHIC STUDIES:  No results found.  ASSESSMENT: 47 year old female with  #1 high risk stage IIB invasive ductal carcinomaOf the right  breast. The tumor is triple negative with a Ki-67 of 25%. She is currently receiving neoadjuvant chemotherapy consisting of FEC being given dose dense.Thus far she is tolerating it well although she does become quite neutropenic.  #2 once patient completes her FEC for 4 cycles she will proceed with dose dense Taxotere for a total of 4 cycles.  #3 upon completion of her chemotherapy she will proceed with mastectomy and axillary lymph node dissection of the right breast and axilla. Post mastectomy patient will need radiation therapy likely since she did have multifocal disease. But we will leave that to the radiation oncologist   PLAN:   #1 patient will proceed with her cycle 3 day 1 of FEC she will return tomorrow for Neulasta.  #2 patient will be seen back in one week's time for followup and interim labs   All questions were answered. The patient knows to call the clinic with any problems, questions or concerns. We can certainly see the patient much sooner if  necessary.  I spent 40 minutes counseling the patient face to face. The total time spent in the appointment was 60 minutes.    Drue Second, MD Medical/Oncology [facility] [phone] (beeper) [phone] (Office)  05/24/2012, 12:58 PM

## 2012-05-25 ENCOUNTER — Encounter: Payer: Self-pay | Admitting: Oncology

## 2012-05-25 ENCOUNTER — Ambulatory Visit (HOSPITAL_BASED_OUTPATIENT_CLINIC_OR_DEPARTMENT_OTHER): Payer: BC Managed Care – PPO

## 2012-05-25 VITALS — BP 120/65 | HR 87 | Temp 97.9°F

## 2012-05-25 DIAGNOSIS — C50919 Malignant neoplasm of unspecified site of unspecified female breast: Secondary | ICD-10-CM

## 2012-05-25 DIAGNOSIS — C50419 Malignant neoplasm of upper-outer quadrant of unspecified female breast: Secondary | ICD-10-CM

## 2012-05-25 DIAGNOSIS — Z5189 Encounter for other specified aftercare: Secondary | ICD-10-CM

## 2012-05-25 DIAGNOSIS — C773 Secondary and unspecified malignant neoplasm of axilla and upper limb lymph nodes: Secondary | ICD-10-CM

## 2012-05-25 MED ORDER — PEGFILGRASTIM INJECTION 6 MG/0.6ML
6.0000 mg | Freq: Once | SUBCUTANEOUS | Status: AC
  Administered 2012-05-25: 6 mg via SUBCUTANEOUS

## 2012-05-27 ENCOUNTER — Telehealth: Payer: Self-pay | Admitting: *Deleted

## 2012-05-27 NOTE — Telephone Encounter (Signed)
Pt called regarding f/u lab/md appt. Per MD pt to have lab 3pm/MD 3:30pm on Friday 05/31/12. Pt notified, verbalized understanding. No further questions. Onc Tx sent

## 2012-05-29 ENCOUNTER — Telehealth: Payer: Self-pay | Admitting: Oncology

## 2012-05-29 NOTE — Telephone Encounter (Signed)
Per new pof sent 1/6 pt to see lb/KK 3pm and 3:30pm 1/10. Per pof pt aware. No instruction given yet for 1/17 f/u.

## 2012-05-31 ENCOUNTER — Ambulatory Visit (HOSPITAL_BASED_OUTPATIENT_CLINIC_OR_DEPARTMENT_OTHER): Payer: BC Managed Care – PPO

## 2012-05-31 ENCOUNTER — Ambulatory Visit (HOSPITAL_BASED_OUTPATIENT_CLINIC_OR_DEPARTMENT_OTHER): Payer: BC Managed Care – PPO | Admitting: Oncology

## 2012-05-31 ENCOUNTER — Other Ambulatory Visit (HOSPITAL_BASED_OUTPATIENT_CLINIC_OR_DEPARTMENT_OTHER): Payer: BC Managed Care – PPO | Admitting: Lab

## 2012-05-31 ENCOUNTER — Telehealth: Payer: Self-pay | Admitting: Oncology

## 2012-05-31 ENCOUNTER — Encounter: Payer: Self-pay | Admitting: Oncology

## 2012-05-31 VITALS — BP 121/74 | HR 91 | Temp 98.7°F | Resp 20 | Ht 67.5 in | Wt 161.5 lb

## 2012-05-31 VITALS — BP 120/71 | HR 76

## 2012-05-31 DIAGNOSIS — D702 Other drug-induced agranulocytosis: Secondary | ICD-10-CM

## 2012-05-31 DIAGNOSIS — C50419 Malignant neoplasm of upper-outer quadrant of unspecified female breast: Secondary | ICD-10-CM

## 2012-05-31 DIAGNOSIS — D6481 Anemia due to antineoplastic chemotherapy: Secondary | ICD-10-CM

## 2012-05-31 DIAGNOSIS — E860 Dehydration: Secondary | ICD-10-CM

## 2012-05-31 LAB — COMPREHENSIVE METABOLIC PANEL (CC13)
ALT: 8 U/L (ref 0–55)
AST: 7 U/L (ref 5–34)
CO2: 30 mEq/L — ABNORMAL HIGH (ref 22–29)
Calcium: 9.2 mg/dL (ref 8.4–10.4)
Chloride: 101 mEq/L (ref 98–107)
Sodium: 139 mEq/L (ref 136–145)
Total Bilirubin: 0.55 mg/dL (ref 0.20–1.20)
Total Protein: 6.5 g/dL (ref 6.4–8.3)

## 2012-05-31 LAB — CBC WITH DIFFERENTIAL/PLATELET
Eosinophils Absolute: 0 10*3/uL (ref 0.0–0.5)
MONO#: 0 10*3/uL — ABNORMAL LOW (ref 0.1–0.9)
NEUT#: 0.1 10*3/uL — CL (ref 1.5–6.5)
RBC: 3.3 10*6/uL — ABNORMAL LOW (ref 3.70–5.45)
RDW: 21.1 % — ABNORMAL HIGH (ref 11.2–14.5)
WBC: 0.7 10*3/uL — CL (ref 3.9–10.3)
nRBC: 0 % (ref 0–0)

## 2012-05-31 MED ORDER — CIPROFLOXACIN HCL 500 MG PO TABS: 500.0000 mg | ORAL_TABLET | Freq: Two times a day (BID) | ORAL | Status: DC

## 2012-05-31 MED ORDER — SODIUM CHLORIDE 0.9 % IV SOLN
INTRAVENOUS | Status: DC
  Administered 2012-05-31: 16:00:00 via INTRAVENOUS

## 2012-05-31 NOTE — Telephone Encounter (Signed)
appts made and printed for pt aom °

## 2012-05-31 NOTE — Patient Instructions (Addendum)
Proceed with IVF today  Return in 1 week for cycle 4 of FEC   Results for ALERA, QUEVEDO (MRN [id number]) as of 05/31/2012 15:59  Ref. Range 05/10/2012 11:12 05/10/2012 11:12 05/24/2012 12:40 05/24/2012 12:40 05/31/2012 15:04  TSH Latest Range: 0.350-4.500 uIU/mL  0.192 (L) 0.089 (L)     Lab Results  Component Value Date   WBC 0.7* 05/31/2012   HGB 9.0* 05/31/2012   HCT 28.1* 05/31/2012   MCV 85.2 05/31/2012   PLT 129* 05/31/2012

## 2012-05-31 NOTE — Progress Notes (Signed)
Patient declined AVS upon discharge.

## 2012-05-31 NOTE — Patient Instructions (Signed)
Dehydration, Adult Dehydration is when you lose more fluids from the body than you take in. Vital organs like the kidneys, brain, and heart cannot function without a proper amount of fluids and salt. Any loss of fluids from the body can cause dehydration.  CAUSES   Vomiting.  Diarrhea.  Excessive sweating.  Excessive urine output.  Fever. SYMPTOMS  Mild dehydration  Thirst.  Dry lips.  Slightly dry mouth. Moderate dehydration  Very dry mouth.  Sunken eyes.  Skin does not bounce back quickly when lightly pinched and released.  Dark urine and decreased urine production.  Decreased tear production.  Headache. Severe dehydration  Very dry mouth.  Extreme thirst.  Rapid, weak pulse (more than 100 beats per minute at rest).  Cold hands and feet.  Not able to sweat in spite of heat and temperature.  Rapid breathing.  Blue lips.  Confusion and lethargy.  Difficulty being awakened.  Minimal urine production.  No tears. DIAGNOSIS  Your caregiver will diagnose dehydration based on your symptoms and your exam. Blood and urine tests will help confirm the diagnosis. The diagnostic evaluation should also identify the cause of dehydration. TREATMENT  Treatment of mild or moderate dehydration can often be done at home by increasing the amount of fluids that you drink. It is best to drink small amounts of fluid more often. Drinking too much at one time can make vomiting worse. Refer to the home care instructions below. Severe dehydration needs to be treated at the hospital where you will probably be given intravenous (IV) fluids that contain water and electrolytes. HOME CARE INSTRUCTIONS   Ask your caregiver about specific rehydration instructions.  Drink enough fluids to keep your urine clear or pale yellow.  Drink small amounts frequently if you have nausea and vomiting.  Eat as you normally do.  Avoid:  Foods or drinks high in sugar.  Carbonated  drinks.  Juice.  Extremely hot or cold fluids.  Drinks with caffeine.  Fatty, greasy foods.  Alcohol.  Tobacco.  Overeating.  Gelatin desserts.  Wash your hands well to avoid spreading bacteria and viruses.  Only take over-the-counter or prescription medicines for pain, discomfort, or fever as directed by your caregiver.  Ask your caregiver if you should continue all prescribed and over-the-counter medicines.  Keep all follow-up appointments with your caregiver. SEEK MEDICAL CARE IF:  You have abdominal pain and it increases or stays in one area (localizes).  You have a rash, stiff neck, or severe headache.  You are irritable, sleepy, or difficult to awaken.  You are weak, dizzy, or extremely thirsty. SEEK IMMEDIATE MEDICAL CARE IF:   You are unable to keep fluids down or you get worse despite treatment.  You have frequent episodes of vomiting or diarrhea.  You have blood or green matter (bile) in your vomit.  You have blood in your stool or your stool looks black and tarry.  You have not urinated in 6 to 8 hours, or you have only urinated a small amount of very dark urine.  You have a fever.  You faint. MAKE SURE YOU:   Understand these instructions.  Will watch your condition.  Will get help right away if you are not doing well or get worse. Document Released: 05/08/2005 Document Revised: 07/31/2011 Document Reviewed: 12/26/2010 [employer] Patient Information 2013 [employer].  

## 2012-06-03 ENCOUNTER — Telehealth: Payer: Self-pay | Admitting: *Deleted

## 2012-06-03 ENCOUNTER — Encounter: Payer: Self-pay | Admitting: *Deleted

## 2012-06-03 NOTE — Telephone Encounter (Signed)
from orders 06-03-2012 left voice message to inform the patient

## 2012-06-06 ENCOUNTER — Other Ambulatory Visit: Payer: BC Managed Care – PPO | Admitting: Lab

## 2012-06-06 ENCOUNTER — Ambulatory Visit: Payer: BC Managed Care – PPO | Admitting: Adult Health

## 2012-06-07 ENCOUNTER — Ambulatory Visit (HOSPITAL_BASED_OUTPATIENT_CLINIC_OR_DEPARTMENT_OTHER): Payer: BC Managed Care – PPO | Admitting: Adult Health

## 2012-06-07 ENCOUNTER — Encounter: Payer: Self-pay | Admitting: Adult Health

## 2012-06-07 ENCOUNTER — Other Ambulatory Visit: Payer: BC Managed Care – PPO | Admitting: Lab

## 2012-06-07 ENCOUNTER — Other Ambulatory Visit (HOSPITAL_BASED_OUTPATIENT_CLINIC_OR_DEPARTMENT_OTHER): Payer: BC Managed Care – PPO | Admitting: Lab

## 2012-06-07 ENCOUNTER — Ambulatory Visit (HOSPITAL_BASED_OUTPATIENT_CLINIC_OR_DEPARTMENT_OTHER): Payer: BC Managed Care – PPO

## 2012-06-07 VITALS — BP 116/75 | HR 87 | Temp 97.8°F | Resp 20 | Ht 67.5 in | Wt 165.7 lb

## 2012-06-07 DIAGNOSIS — C50919 Malignant neoplasm of unspecified site of unspecified female breast: Secondary | ICD-10-CM

## 2012-06-07 DIAGNOSIS — Z5111 Encounter for antineoplastic chemotherapy: Secondary | ICD-10-CM

## 2012-06-07 DIAGNOSIS — C50419 Malignant neoplasm of upper-outer quadrant of unspecified female breast: Secondary | ICD-10-CM

## 2012-06-07 DIAGNOSIS — C773 Secondary and unspecified malignant neoplasm of axilla and upper limb lymph nodes: Secondary | ICD-10-CM

## 2012-06-07 DIAGNOSIS — Z171 Estrogen receptor negative status [ER-]: Secondary | ICD-10-CM

## 2012-06-07 LAB — CBC WITH DIFFERENTIAL/PLATELET
Basophils Absolute: 0 10*3/uL (ref 0.0–0.1)
Eosinophils Absolute: 0 10*3/uL (ref 0.0–0.5)
MCH: 28.1 pg (ref 25.1–34.0)
MCHC: 32.2 g/dL (ref 31.5–36.0)
MONO#: 0.6 10*3/uL (ref 0.1–0.9)
NEUT%: 71.3 % (ref 38.4–76.8)
Platelets: 157 10*3/uL (ref 145–400)
RBC: 3.31 10*6/uL — ABNORMAL LOW (ref 3.70–5.45)
WBC: 5.8 10*3/uL (ref 3.9–10.3)

## 2012-06-07 LAB — COMPREHENSIVE METABOLIC PANEL (CC13)
Albumin: 3.3 g/dL — ABNORMAL LOW (ref 3.5–5.0)
CO2: 27 mEq/L (ref 22–29)
Calcium: 9.1 mg/dL (ref 8.4–10.4)
Glucose: 102 mg/dl — ABNORMAL HIGH (ref 70–99)
Potassium: 3.7 mEq/L (ref 3.5–5.1)
Sodium: 141 mEq/L (ref 136–145)
Total Protein: 6.5 g/dL (ref 6.4–8.3)

## 2012-06-07 MED ORDER — SODIUM CHLORIDE 0.9 % IV SOLN
150.0000 mg | Freq: Once | INTRAVENOUS | Status: AC
  Administered 2012-06-07: 150 mg via INTRAVENOUS
  Filled 2012-06-07: qty 5

## 2012-06-07 MED ORDER — EPIRUBICIN HCL CHEMO IV INJECTION 200 MG/100ML
100.0000 mg/m2 | Freq: Once | INTRAVENOUS | Status: AC
  Administered 2012-06-07: 192 mg via INTRAVENOUS
  Filled 2012-06-07: qty 96

## 2012-06-07 MED ORDER — HEPARIN SOD (PORK) LOCK FLUSH 100 UNIT/ML IV SOLN
500.0000 [IU] | Freq: Once | INTRAVENOUS | Status: AC | PRN
  Administered 2012-06-07: 500 [IU]
  Filled 2012-06-07: qty 5

## 2012-06-07 MED ORDER — FLUOROURACIL CHEMO INJECTION 2.5 GM/50ML
500.0000 mg/m2 | Freq: Once | INTRAVENOUS | Status: AC
  Administered 2012-06-07: 950 mg via INTRAVENOUS
  Filled 2012-06-07: qty 19

## 2012-06-07 MED ORDER — SODIUM CHLORIDE 0.9 % IV SOLN
500.0000 mg/m2 | Freq: Once | INTRAVENOUS | Status: AC
  Administered 2012-06-07: 960 mg via INTRAVENOUS
  Filled 2012-06-07: qty 48

## 2012-06-07 MED ORDER — PALONOSETRON HCL INJECTION 0.25 MG/5ML
0.2500 mg | Freq: Once | INTRAVENOUS | Status: AC
  Administered 2012-06-07: 0.25 mg via INTRAVENOUS

## 2012-06-07 MED ORDER — SODIUM CHLORIDE 0.9 % IV SOLN
Freq: Once | INTRAVENOUS | Status: AC
  Administered 2012-06-07: 10:00:00 via INTRAVENOUS

## 2012-06-07 MED ORDER — SODIUM CHLORIDE 0.9 % IJ SOLN
10.0000 mL | INTRAMUSCULAR | Status: DC | PRN
  Administered 2012-06-07: 10 mL
  Filled 2012-06-07: qty 10

## 2012-06-07 MED ORDER — DEXAMETHASONE SODIUM PHOSPHATE 4 MG/ML IJ SOLN
12.0000 mg | Freq: Once | INTRAMUSCULAR | Status: AC
  Administered 2012-06-07: 12 mg via INTRAVENOUS

## 2012-06-07 NOTE — Patient Instructions (Signed)
[facility] Discharge Instructions for Patients Receiving Chemotherapy  Today you received the following chemotherapy agents Ellence, Cytoxan and Fluorouracil.   To help prevent nausea and vomiting after your treatment, we encourage you to take your nausea medication, Prochloperazine (Compazine).  Begin taking it as needed and take it as often as prescribed for the next 72 hours. For nausea after 72 hours, take Ondansetron (Zofran).   If you develop nausea and vomiting that is not controlled by your nausea medication, call the clinic. If it is after clinic hours your family physician or the after hours number for the clinic or go to the Emergency Department.   BELOW ARE SYMPTOMS THAT SHOULD BE REPORTED IMMEDIATELY:  *FEVER GREATER THAN 100.5 F  *CHILLS WITH OR WITHOUT FEVER  NAUSEA AND VOMITING THAT IS NOT CONTROLLED WITH YOUR NAUSEA MEDICATION  *UNUSUAL SHORTNESS OF BREATH  *UNUSUAL BRUISING OR BLEEDING  TENDERNESS IN MOUTH AND THROAT WITH OR WITHOUT PRESENCE OF ULCERS  *URINARY PROBLEMS  *BOWEL PROBLEMS  UNUSUAL RASH Items with * indicate a potential emergency and should be followed up as soon as possible.  Feel free to call the clinic you have any questions or concerns. The clinic phone number is [phone].   I have been informed and understand all the instructions given to me. I know to contact the clinic, my physician, or go to the Emergency Department if any problems should occur. I do not have any questions at this time, but understand that I may call the clinic during office hours   should I have any questions or need assistance in obtaining follow up care.

## 2012-06-07 NOTE — Progress Notes (Signed)
OFFICE PROGRESS NOTE  CC**  [name], MD [address] 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 Neoadjuvant chemotherapy with FEC q14d  CURRENT THERAPY:FEC C4 d1  INTERVAL HISTORY: Nicole Valentine 47 y.o. female returns for evaluation for cycle four of FEC.  She's doing well.  She has taken her cipro without any problem. She denies fevers, chills, nausea, vomiting, constipation, diarrhea, numbness, or any other concerns.  A 10 point ROS is neg.   MEDICAL HISTORY: Past Medical History  Diagnosis Date  . Breast cancer   . Thyroid disease   . Hypothyroidism   . Contact lens/glasses fitting     wears contacts or glasses  . Complication of anesthesia     her father and uncle both had very difficult time waking up-was  something they told them may be hereditory. she will find out.    ALLERGIES:  is allergic to almond meal.  MEDICATIONS:  Current Outpatient Prescriptions  Medication Sig Dispense Refill  . ciprofloxacin (CIPRO) 500 MG tablet Take 1 tablet (500 mg total) by mouth 2 (two) times daily.  14 tablet  3  . dexamethasone (DECADRON) 4 MG tablet Take 2 tablets by mouth once a day on the day after chemotherapy and then take 2 tablets two times a day for 2 days. Take with food.  30 tablet  1  . Ferrous Sulfate Dried (SLOW RELEASE IRON) 45 MG TBCR Take by mouth daily.      [name] HYDROcodone-acetaminophen (NORCO) 5-325 MG per tablet Take 1 tablet by mouth every 4 (four) hours as needed for pain.  30 tablet  0  . levothyroxine (SYNTHROID, LEVOTHROID) 200 MCG tablet       . lidocaine-prilocaine (EMLA) cream Apply topically as needed.  30 g  0  . LORazepam (ATIVAN) 0.5 MG tablet Take 1 tablet (0.5 mg total) by mouth every 6 (six) hours as needed (Nausea or vomiting).  30 tablet  0  . ondansetron (ZOFRAN) 8 MG tablet Take 1 tablet (8 mg total) by mouth 2 (two) times daily as needed. Take two times a day as  needed for nausea or vomiting starting on the third day after chemotherapy.  30 tablet  1  . prochlorperazine (COMPAZINE) 10 MG tablet Take 1 tablet (10 mg total) by mouth every 6 (six) hours as needed (Nausea or vomiting).  30 tablet  1  . prochlorperazine (COMPAZINE) 25 MG suppository Place 1 suppository (25 mg total) rectally every 12 (twelve) hours as needed for nausea.  12 suppository  3   No current facility-administered medications for this visit.   Facility-Administered Medications Ordered in Other Visits  Medication Dose Route Frequency Provider Last Rate Last Dose  . sodium chloride 0.9 % injection 10 mL  10 mL Intracatheter PRN Victorino December, MD   10 mL at 05/24/12 1645    SURGICAL HISTORY:  Past Surgical History  Procedure Date  . Wisdom tooth extraction   . Portacath placement 04/22/2012    Procedure: INSERTION PORT-A-CATH;  Surgeon: Currie Paris, MD;  Location: [facility];  Service: General;  Laterality: Left;    REVIEW OF SYSTEMS:  General: fatigue (-), night sweats (-), fever (-), pain (-) Lymph: palpable nodes (-) HEENT: vision changes (-), mucositis (-), gum bleeding (-), epistaxis (-) Cardiovascular: chest pain (-), palpitations (-) Pulmonary: shortness of breath (-), dyspnea on exertion (-), cough (-), hemoptysis (-) GI:  Early satiety (-),  melena (-), dysphagia (-), nausea/vomiting (-), diarrhea (-) GU: dysuria (-), hematuria (-), incontinence (-) Musculoskeletal: joint swelling (-), joint pain (-), back pain (-) Neuro: weakness (-), numbness (-), headache (-), confusion (-) Skin: Rash (-), lesions (-), dryness (-) Psych: depression (-), suicidal/homicidal ideation (-), feeling of hopelessness (-)    PHYSICAL EXAMINATION: Blood pressure 116/75, pulse 87, temperature 97.8 F (36.6 C), resp. rate 20, height 5' 7.5" (1.715 m), weight 165 lb 11.2 oz (75.161 kg). Body mass index is 25.57 kg/(m^2). General: Patient is a well appearing female in  no acute distress HEENT: PERRLA, sclerae anicteric no conjunctival pallor, MMM Neck: supple, no palpable adenopathy Lungs: clear to auscultation bilaterally, no wheezes, rhonchi, or rales Cardiovascular: regular rate rhythm, S1, S2, no murmurs, rubs or gallops Abdomen: Soft, non-tender, non-distended, normoactive bowel sounds, no HSM Extremities: warm and well perfused, no clubbing, cyanosis, or edema Skin: No rashes or lesions Neuro: Non-focal  ECOG PERFORMANCE STATUS: 1 - Symptomatic but completely ambulatory      LABORATORY DATA: Lab Results  Component Value Date   WBC 0.7* 05/31/2012   HGB 9.0* 05/31/2012   HCT 28.1* 05/31/2012   MCV 85.2 05/31/2012   PLT 129* 05/31/2012      Chemistry      Component Value Date/Time   NA 139 05/31/2012 1504   K 3.9 05/31/2012 1504   CL 101 05/31/2012 1504   CO2 30* 05/31/2012 1504   BUN 14.0 05/31/2012 1504   CREATININE 0.6 05/31/2012 1504      Component Value Date/Time   CALCIUM 9.2 05/31/2012 1504   ALKPHOS 94 05/31/2012 1504   AST 7 05/31/2012 1504   ALT 8 05/31/2012 1504   BILITOT 0.55 05/31/2012 1504       RADIOGRAPHIC STUDIES:  No results found.  ASSESSMENT:  1. 47 y/o with triple negative breast cancer in the right breast currently undergoing neoadjuvant chemotherapy with FEC q14d.    PLAN:  1. Nicole Valentine is doing well today, her labs have recovered.  She will proceed with cycle 4 of FEC today.    2. I will see her back next week for her nadir check.     All questions were answered. The patient knows to call the clinic with any problems, questions or concerns. We can certainly see the patient much sooner if necessary.  I spent 25 minutes counseling the patient face to face. The total time spent in the appointment was 30 minutes.   Cherie Ouch Lyn Hollingshead, NP Medical Oncology [facility] Phone: [phone] 06/07/2012, 9:02 AM

## 2012-06-07 NOTE — Patient Instructions (Addendum)
Doing well.  Proceed with chemotherapy.  Please call us if you have any questions or concerns.    We will see you back next week.   

## 2012-06-08 ENCOUNTER — Ambulatory Visit (HOSPITAL_BASED_OUTPATIENT_CLINIC_OR_DEPARTMENT_OTHER): Payer: BC Managed Care – PPO

## 2012-06-08 VITALS — BP 114/68 | HR 90 | Temp 98.4°F | Resp 18

## 2012-06-08 DIAGNOSIS — C50919 Malignant neoplasm of unspecified site of unspecified female breast: Secondary | ICD-10-CM

## 2012-06-08 DIAGNOSIS — C50419 Malignant neoplasm of upper-outer quadrant of unspecified female breast: Secondary | ICD-10-CM

## 2012-06-08 DIAGNOSIS — Z5189 Encounter for other specified aftercare: Secondary | ICD-10-CM

## 2012-06-08 DIAGNOSIS — C773 Secondary and unspecified malignant neoplasm of axilla and upper limb lymph nodes: Secondary | ICD-10-CM

## 2012-06-08 MED ORDER — PEGFILGRASTIM INJECTION 6 MG/0.6ML
6.0000 mg | Freq: Once | SUBCUTANEOUS | Status: AC
  Administered 2012-06-08: 6 mg via SUBCUTANEOUS

## 2012-06-08 NOTE — Patient Instructions (Signed)

## 2012-06-10 ENCOUNTER — Other Ambulatory Visit: Payer: Self-pay | Admitting: Certified Registered Nurse Anesthetist

## 2012-06-11 NOTE — Progress Notes (Signed)
OFFICE PROGRESS NOTE  CC  Nicole Found, MD [address] [place] [address] Dr. Fatima Sanger Dr. 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 Diagnosed November 2013. Clinical stage II (T2 N1)  PRIOR THERAPY:  #1 patient originally presented to the multidisciplinary breast clinic on 04/10/2012. She originally palpated a right breast mass. She had a mammogram performed that showed dense breasts bilaterally. Ultrasound of the right breast showed 2.3 x 1.9 cm area of abnormality with multiple abnormal lymph nodes. MRI of the bilateral breasts showed asymmetrical enhancement throughout the right breast consistent with multicentric disease. An ultrasound-guided biopsy performed. The known area of disease measured 1.9 x 1.9 x 2.3 cm. Multiple abnormal positive right axillary lymph nodes were noted. 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 Biopsy of the right axillary lymph node was positive for malignancy with extracapsular extension. Patient was originally seen by Dr. Jamey Ripa Dr. Donnie Coffin and Dr. Michell Heinrich. She has elected to have a right mastectomy eventually and declined biopsies of any other areas within the breast.  #2 after extensive discussion patient initially was started on neoadjuvant chemotherapy by Dr. Donnie Coffin. He began her on neoadjuvant FEC given dose dense every 2 weeks with day 2 Neulasta. For 4 cycles.Followed by dose dense Taxotere every 2 weeks for a total of 4 cycles. She began her chemotherapy on 04/26/2012.  CURRENT THERAPY: Status per cycle 3 of FEC with day 2 Neulasta  INTERVAL HISTORY: Nicole Valentine 47 y.o. female returns for Followup visit  Today she does feel tired. She is neutropenic. She however has not had any fevers. I will go ahead and get her  started on Cipro 500 mg twice a day to help prevent febrile neutropenia. She denies any nausea vomiting fevers chills night sweats no myalgias and arthralgias. Remainder of the 10 point review of systems is negative MEDICAL HISTORY: Past Medical History  Diagnosis Date  . Breast cancer   . Thyroid disease   . Hypothyroidism   . Contact lens/glasses fitting     wears contacts or glasses  . Complication of anesthesia     her father and uncle both had very difficult time waking up-was  something they told them may be hereditory. she will find out.    ALLERGIES:  is allergic to almond meal.  MEDICATIONS:  Current Outpatient Prescriptions  Medication Sig Dispense Refill  . dexamethasone (DECADRON) 4 MG tablet Take 2 tablets by mouth once a day on the day after chemotherapy and then take 2 tablets two times a day for 2 days. Take with food.  30 tablet  1  . Ferrous Sulfate Dried (SLOW RELEASE IRON) 45 MG TBCR Take by mouth daily.      [name] levothyroxine (SYNTHROID, LEVOTHROID) 200 MCG tablet       . lidocaine-prilocaine (EMLA) cream Apply topically as needed.  30 g  0  . LORazepam (ATIVAN) 0.5 MG tablet Take 1 tablet (0.5 mg total) by mouth every 6 (six) hours as needed (Nausea or vomiting).  30 tablet  0  . ciprofloxacin (CIPRO) 500 MG tablet Take 1 tablet (500 mg total) by mouth 2 (two) times daily.  14 tablet  3  . HYDROcodone-acetaminophen (NORCO) 5-325 MG per tablet Take 1 tablet by mouth every 4 (four) hours as needed for pain.  30  tablet  0  . ondansetron (ZOFRAN) 8 MG tablet Take 1 tablet (8 mg total) by mouth 2 (two) times daily as needed. Take two times a day as needed for nausea or vomiting starting on the third day after chemotherapy.  30 tablet  1  . prochlorperazine (COMPAZINE) 10 MG tablet Take 1 tablet (10 mg total) by mouth every 6 (six) hours as needed (Nausea or vomiting).  30 tablet  1  . prochlorperazine (COMPAZINE) 25 MG suppository Place 1 suppository (25 mg total) rectally  every 12 (twelve) hours as needed for nausea.  12 suppository  3   No current facility-administered medications for this visit.   Facility-Administered Medications Ordered in Other Visits  Medication Dose Route Frequency Provider Last Rate Last Dose  . sodium chloride 0.9 % injection 10 mL  10 mL Intracatheter PRN Victorino December, MD   10 mL at 05/24/12 1645    SURGICAL HISTORY:  Past Surgical History  Procedure Date  . Wisdom tooth extraction   . Portacath placement 04/22/2012    Procedure: INSERTION PORT-A-CATH;  Surgeon: Currie Paris, MD;  Location: [facility];  Service: General;  Laterality: Left;    REVIEW OF SYSTEMS:  Pertinent items are noted in HPI.   HEALTH MAINTENANCE:   PHYSICAL EXAMINATION: Blood pressure 121/74, pulse 91, temperature 98.7 F (37.1 C), temperature source Oral, resp. rate 20, height 5' 7.5" (1.715 m), weight 161 lb 8 oz (73.256 kg). Body mass index is 24.92 kg/(m^2). ECOG PERFORMANCE STATUS: 0 - Asymptomatic   General appearance: alert, cooperative and appears stated age Lymph nodes: Cervical, supraclavicular, and axillary nodes normal. Resp: clear to auscultation bilaterally Back: symmetric, no curvature. ROM normal. No CVA tenderness. Cardio: regular rate and rhythm GI: soft, non-tender; bowel sounds normal; no masses,  no organomegaly Extremities: extremities normal, atraumatic, no cyanosis or edema Neurologic: Grossly normal Right breast still does reveal a palpable mass no axillary masses are noted left breast no masses or nipple discharge  LABORATORY DATA: Lab Results  Component Value Date   WBC 5.8 06/07/2012   HGB 9.3* 06/07/2012   HCT 28.9* 06/07/2012   MCV 87.3 06/07/2012   PLT 157 06/07/2012      Chemistry      Component Value Date/Time   NA 141 06/07/2012 0837   K 3.7 06/07/2012 0837   CL 106 06/07/2012 0837   CO2 27 06/07/2012 0837   BUN 8.0 06/07/2012 0837   CREATININE 0.7 06/07/2012 0837      Component Value  Date/Time   CALCIUM 9.1 06/07/2012 0837   ALKPHOS 107 06/07/2012 0837   AST 40* 06/07/2012 0837   ALT 53 06/07/2012 0837   BILITOT 0.28 06/07/2012 0837       RADIOGRAPHIC STUDIES:  No results Valentine.  ASSESSMENT: 47 year old female with  #1 high risk stage IIB invasive ductal carcinomaOf the right breast. The tumor is triple negative with a Ki-67 of 25%. She is currently receiving neoadjuvant chemotherapy consisting of FEC being given dose dense.Thus far she is tolerating it well although she does become quite neutropenic.  #2 once patient completes her FEC for 4 cycles she will proceed with dose dense Taxotere for a total of 4 cycles.  #3 upon completion of her chemotherapy she will proceed with mastectomy and axillary lymph node dissection of the right breast and axilla. Post mastectomy patient will need radiation therapy likely since she did have multifocal disease. But we will leave that to the radiation oncologist  PLAN:   #1 Patient is neutropenic today she is placed on Cipro 500 mg twice a day. She also is anemic but we will watch her.  #2 she will come back in one week's time for cycle #4 of FEC.  All questions were answered. The patient knows to call the clinic with any problems, questions or concerns. We can certainly see the patient much sooner if necessary.  I spent 25 minutes counseling the patient face to face. The total time spent in the appointment was 30 minutes.    Drue Second, MD Medical/Oncology [facility] [phone] (beeper) [phone] (Office)  06/11/2012, 12:06 AM

## 2012-06-13 ENCOUNTER — Other Ambulatory Visit (HOSPITAL_BASED_OUTPATIENT_CLINIC_OR_DEPARTMENT_OTHER): Payer: BC Managed Care – PPO | Admitting: Lab

## 2012-06-13 ENCOUNTER — Encounter: Payer: Self-pay | Admitting: Genetic Counselor

## 2012-06-13 ENCOUNTER — Ambulatory Visit (HOSPITAL_BASED_OUTPATIENT_CLINIC_OR_DEPARTMENT_OTHER): Payer: BC Managed Care – PPO | Admitting: Genetic Counselor

## 2012-06-13 DIAGNOSIS — C50419 Malignant neoplasm of upper-outer quadrant of unspecified female breast: Secondary | ICD-10-CM

## 2012-06-13 DIAGNOSIS — Z171 Estrogen receptor negative status [ER-]: Secondary | ICD-10-CM

## 2012-06-13 DIAGNOSIS — C50919 Malignant neoplasm of unspecified site of unspecified female breast: Secondary | ICD-10-CM

## 2012-06-13 LAB — CBC WITH DIFFERENTIAL/PLATELET
BASO%: 1.2 % (ref 0.0–2.0)
HCT: 25.5 % — ABNORMAL LOW (ref 34.8–46.6)
LYMPH%: 43.5 % (ref 14.0–49.7)
MCH: 29.9 pg (ref 25.1–34.0)
MCHC: 34.2 g/dL (ref 31.5–36.0)
MCV: 87.3 fL (ref 79.5–101.0)
MONO#: 0 10*3/uL — ABNORMAL LOW (ref 0.1–0.9)
MONO%: 1.3 % (ref 0.0–14.0)
NEUT%: 53.9 % (ref 38.4–76.8)
Platelets: 153 10*3/uL (ref 145–400)
WBC: 1.8 10*3/uL — ABNORMAL LOW (ref 3.9–10.3)

## 2012-06-13 LAB — COMPREHENSIVE METABOLIC PANEL (CC13)
ALT: 33 U/L (ref 0–55)
CO2: 30 mEq/L — ABNORMAL HIGH (ref 22–29)
Calcium: 9 mg/dL (ref 8.4–10.4)
Chloride: 103 mEq/L (ref 98–107)
Creatinine: 0.7 mg/dL (ref 0.6–1.1)
Glucose: 90 mg/dl (ref 70–99)
Sodium: 140 mEq/L (ref 136–145)
Total Bilirubin: 0.47 mg/dL (ref 0.20–1.20)
Total Protein: 6.1 g/dL — ABNORMAL LOW (ref 6.4–8.3)

## 2012-06-13 NOTE — Progress Notes (Signed)
Dr.  Drue Second requested a consultation for genetic counseling and risk assessment for Nicole Valentine, a 47 y.o. female, for discussion of her personal history of triple negative breast cancer. She presents to clinic today to discuss the possibility of a genetic predisposition to cancer, and to further clarify her risks, as well as her family members' risks for cancer.   HISTORY OF PRESENT ILLNESS: In November 2013, at the age of 1, Nicole Valentine was diagnosed with triple negative invasive ductal carcinoma of the right breast. This was treated with chemotherapy, and she is scheduled for surgery and radiation.    Past Medical History  Diagnosis Date  . Breast cancer   . Thyroid disease   . Hypothyroidism   . Contact lens/glasses fitting     wears contacts or glasses  . Complication of anesthesia     her father and uncle both had very difficult time waking up-was  something they told them may be hereditory. she will find out.    Past Surgical History  Procedure Date  . Wisdom tooth extraction   . Portacath placement 04/22/2012    Procedure: INSERTION PORT-A-CATH;  Surgeon: Currie Paris, MD;  Location: [facility];  Service: General;  Laterality: Left;    History  Substance Use Topics  . Smoking status: Never Smoker   . Smokeless tobacco: Not on file  . Alcohol Use: No    REPRODUCTIVE HISTORY AND PERSONAL RISK ASSESSMENT FACTORS: Menarche was at age 63-13.   Premenopausal Uterus Intact: Yes Ovaries Intact: Yes [id number] , first live birth at age 5  She has not previously undergone treatment for infertility.   OCP use for <1 year   She has not used HRT in the past.    FAMILY HISTORY:  We obtained a detailed, 4-generation family history.  Significant diagnoses are listed below: Family History  Problem Relation Age of Onset  . Lung cancer Maternal Grandfather 43    smoker  . Prostate cancer Maternal Grandfather 90  . Throat cancer Other     Great  Aunt x 2  . Liver cancer Other     Maternal Great Grandmother  . Melanoma Maternal Uncle 81  . Brain cancer Cousin 11    non-malignant  The patient was diagnosed with triple negative breast cancer at age 71.  She has two brothers who are healthy.  Her parents are alive and never diagnosed with cancer.  The patient's maternal uncle just finished his chemotherapy for melanoma on his head at age 48. He had a granddaughter who had a brain tumor at age 84.  The patient's maternal grandfather had lung cancer at age 62 and prostate cancer at 84.  He had two sisters who had throat cancer.  There is no other reported cancer history on either side of the family.  Patient's maternal ancestors are of [place], [place], [place] and [place] descent, and paternal ancestors are of [place] descent. There is no reported Ashkenazi Jewish ancestry. There is no  known consanguinity.  GENETIC COUNSELING RISK ASSESSMENT, DISCUSSION, AND SUGGESTED FOLLOW UP: We reviewed the natural history and genetic etiology of sporadic, familial and hereditary cancer syndromes.  About 5-10% of breast cancer is hereditary.  Of this, about 85% is the result of a BRCA1 or BRCA2 mutation.  We reviewed the red flags of hereditary cancer syndromes and the dominant inheritance patterns.  If the BRCA testing is negative, we discussed that we could be testing for the wrong gene.  We  discussed gene panels, and that several cancer genes that are associated with different cancers can be tested at the same time.  Because of the different types of cancer that are in the patient's family, we will consider one of the panel tests if she is negative for BRCA mutations.   The patient's personal history of triple negative breast cancer and family history of melanoma and prostate cancer  is suggestive of the following possible diagnosis: hereditary cancer syndrome  We discussed that identification of a hereditary cancer syndrome may help her care providers tailor  the patients medical management. If a mutation indicating a hereditary cancer syndrome is detected in this case, the [employer] recommendations would include increased cancer surveillance and possible prophylactic surgeyr. If a mutation is detected, the patient will be referred back to the referring provider and to any additional appropriate care providers to discuss the relevant options.   If a mutation is not found in the patient, this will decrease the likelihood of a hereditary cancer syndrome as the explanation for her breast cancer. Cancer surveillance options would be discussed for the patient according to the appropriate standard National Comprehensive Cancer Network and American Cancer Society guidelines, with consideration of their personal and family history risk factors. In this case, the patient will be referred back to their care providers for discussions of management.   In order to estimate her chance of having a BRCA mutation, we used statistical models (Penn II) and laboratory data that take into account her personal medical history, family history and ancestry.  Because each model is different, there can be a lot of variability in the risks they give.  Therefore, these numbers must be considered a rough range and not a precise risk of having a BRCA mutation.  This model estimates that she has approximately a 10% chance of having a mutation. Based on this assessment of her family and personal history, genetic testing is recommended.  After considering the risks, benefits, and limitations, the patient provided informed consent for  the following  testing: BRACAnalysis and Myrisk through [facility] laboratories.   Per the patient's request, we will contact her by telephone to discuss these results. A follow up genetic counseling visit will be scheduled if indicated.  The patient was seen for a total of 60 minutes, greater than 50% of which was spent  face-to-face counseling.  This plan is being carried out per Dr. Feliz Beam recommendations.  This note will also be sent to the referring provider via the electronic medical record. The patient will be supplied with a summary of this genetic counseling discussion as well as educational information on the discussed hereditary cancer syndromes following the conclusion of their visit.   Patient was discussed with Dr. Drue Second.   _______________________________________________________________________ For Office Staff:  Number of people involved in session: 2 Was an Intern/ student involved with case: no

## 2012-06-14 ENCOUNTER — Ambulatory Visit (HOSPITAL_BASED_OUTPATIENT_CLINIC_OR_DEPARTMENT_OTHER): Payer: BC Managed Care – PPO | Admitting: Adult Health

## 2012-06-14 ENCOUNTER — Other Ambulatory Visit: Payer: BC Managed Care – PPO | Admitting: Lab

## 2012-06-14 ENCOUNTER — Other Ambulatory Visit: Payer: Self-pay | Admitting: Oncology

## 2012-06-14 ENCOUNTER — Encounter: Payer: Self-pay | Admitting: Adult Health

## 2012-06-14 VITALS — BP 120/74 | HR 98 | Temp 98.4°F | Resp 20 | Ht 67.5 in | Wt 167.8 lb

## 2012-06-14 DIAGNOSIS — C50919 Malignant neoplasm of unspecified site of unspecified female breast: Secondary | ICD-10-CM

## 2012-06-14 DIAGNOSIS — N898 Other specified noninflammatory disorders of vagina: Secondary | ICD-10-CM

## 2012-06-14 DIAGNOSIS — C50419 Malignant neoplasm of upper-outer quadrant of unspecified female breast: Secondary | ICD-10-CM

## 2012-06-14 DIAGNOSIS — C773 Secondary and unspecified malignant neoplasm of axilla and upper limb lymph nodes: Secondary | ICD-10-CM

## 2012-06-14 DIAGNOSIS — D649 Anemia, unspecified: Secondary | ICD-10-CM

## 2012-06-14 MED ORDER — DEXAMETHASONE 4 MG PO TABS: ORAL_TABLET | ORAL | Status: DC

## 2012-06-14 NOTE — Patient Instructions (Signed)
Doing well, you are not neutropenic today, your labs look good.  Call us if you have a fever, chills, or any other concerns.

## 2012-06-14 NOTE — Progress Notes (Signed)
OFFICE PROGRESS NOTE  CC  [name], MD [address] [place] [address] Dr. Fatima Sanger Dr. 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 Diagnosed November 2013. Clinical stage II (T2 N1)  PRIOR THERAPY:  #1 patient originally presented to the multidisciplinary breast clinic on 04/10/2012. She originally palpated a right breast mass. She had a mammogram performed that showed dense breasts bilaterally. Ultrasound of the right breast showed 2.3 x 1.9 cm area of abnormality with multiple abnormal lymph nodes. MRI of the bilateral breasts showed asymmetrical enhancement throughout the right breast consistent with multicentric disease. An ultrasound-guided biopsy performed. The known area of disease measured 1.9 x 1.9 x 2.3 cm. Multiple abnormal positive right axillary lymph nodes were noted. 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 Biopsy of the right axillary lymph node was positive for malignancy with extracapsular extension. Patient was originally seen by Dr. Jamey Ripa Dr. Donnie Coffin and Dr. Michell Heinrich. She has elected to have a right mastectomy eventually and declined biopsies of any other areas within the breast.  #2 after extensive discussion patient initially was started on neoadjuvant chemotherapy by Dr. Donnie Coffin. He began her on neoadjuvant FEC given dose dense every 2 weeks with day 2 Neulasta. For 4 cycles.Followed by dose dense Taxotere every 2 weeks for a total of 4 cycles. She began her chemotherapy on 04/26/2012.  CURRENT THERAPY: Status per cycle 4 day 8 of FEC with day 2 Neulasta  INTERVAL HISTORY: Nicole Valentine 47 y.o. female returns for Followup visit after her chemotherapy.  She is not neutropenic today.  She does endorse fatigue.  She denies fevers, chills,  numbness, pain, or any other concerns.  She started her menstrual cycle on Monday, and has been having some spotting, but no heavy bleeding.  Otherwise, a 10 point ROS is neg.   MEDICAL HISTORY: Past Medical History  Diagnosis Date  . Breast cancer   . Thyroid disease   . Hypothyroidism   . Contact lens/glasses fitting     wears contacts or glasses  . Complication of anesthesia     her father and uncle both had very difficult time waking up-was  something they told them may be hereditory. she will find out.    ALLERGIES:  is allergic to almond meal.  MEDICATIONS:  Current Outpatient Prescriptions  Medication Sig Dispense Refill  . dexamethasone (DECADRON) 4 MG tablet Take 2 tablets by mouth once a day on the day after chemotherapy and then take 2 tablets two times a day for 2 days. Take with food.  30 tablet  1  . Ferrous Sulfate Dried (SLOW RELEASE IRON) 45 MG TBCR Take by mouth daily.      [name] levothyroxine (SYNTHROID, LEVOTHROID) 200 MCG tablet       . lidocaine-prilocaine (EMLA) cream Apply topically as needed.  30 g  0  . ciprofloxacin (CIPRO) 500 MG tablet Take 1 tablet (500 mg total) by mouth 2 (two) times daily.  14 tablet  3  . HYDROcodone-acetaminophen (NORCO) 5-325 MG per tablet Take 1 tablet by mouth every 4 (four) hours as needed for pain.  30 tablet  0  . LORazepam (ATIVAN) 0.5 MG tablet Take 1 tablet (0.5 mg total) by mouth every 6 (six) hours as needed (Nausea or vomiting).  30 tablet  0  .  ondansetron (ZOFRAN) 8 MG tablet Take 1 tablet (8 mg total) by mouth 2 (two) times daily as needed. Take two times a day as needed for nausea or vomiting starting on the third day after chemotherapy.  30 tablet  1  . prochlorperazine (COMPAZINE) 10 MG tablet Take 1 tablet (10 mg total) by mouth every 6 (six) hours as needed (Nausea or vomiting).  30 tablet  1  . prochlorperazine (COMPAZINE) 25 MG suppository Place 1 suppository (25 mg total) rectally every 12 (twelve) hours as needed for  nausea.  12 suppository  3   No current facility-administered medications for this visit.   Facility-Administered Medications Ordered in Other Visits  Medication Dose Route Frequency Provider Last Rate Last Dose  . sodium chloride 0.9 % injection 10 mL  10 mL Intracatheter PRN Victorino December, MD   10 mL at 05/24/12 1645    SURGICAL HISTORY:  Past Surgical History  Procedure Date  . Wisdom tooth extraction   . Portacath placement 04/22/2012    Procedure: INSERTION PORT-A-CATH;  Surgeon: Currie Paris, MD;  Location: [facility];  Service: General;  Laterality: Left;    REVIEW OF SYSTEMS:  General: fatigue (-), night sweats (-), fever (-), pain (-) Lymph: palpable nodes (-) HEENT: vision changes (-), mucositis (-), gum bleeding (-), epistaxis (-) Cardiovascular: chest pain (-), palpitations (-) Pulmonary: shortness of breath (-), dyspnea on exertion (-), cough (-), hemoptysis (-) GI:  Early satiety (-), melena (-), dysphagia (-), nausea/vomiting (-), diarrhea (-) GU: dysuria (-), hematuria (-), incontinence (-) Musculoskeletal: joint swelling (-), joint pain (-), back pain (-) Neuro: weakness (-), numbness (-), headache (-), confusion (-) Skin: Rash (-), lesions (-), dryness (-) Psych: depression (-), suicidal/homicidal ideation (-), feeling of hopelessness (-)    PHYSICAL EXAMINATION: Blood pressure 120/74, pulse 98, temperature 98.4 F (36.9 C), temperature source Oral, resp. rate 20, height 5' 7.5" (1.715 m), weight 167 lb 12.8 oz (76.114 kg). Body mass index is 25.89 kg/(m^2). General: Patient is a well appearing female in no acute distress HEENT: PERRLA, sclerae anicteric no conjunctival pallor, MMM Neck: supple, no palpable adenopathy Lungs: clear to auscultation bilaterally, no wheezes, rhonchi, or rales Cardiovascular: regular rate rhythm, S1, S2, no murmurs, rubs or gallops Abdomen: Soft, non-tender, non-distended, normoactive bowel sounds, no  HSM Extremities: warm and well perfused, no clubbing, cyanosis, or edema Skin: No rashes or lesions Neuro: Non-focal ECOG PERFORMANCE STATUS: 0 - Asymptomatic Right breast still does reveal a palpable mass no axillary masses are noted left breast no masses or nipple discharge  LABORATORY DATA: Lab Results  Component Value Date   WBC 1.8* 06/13/2012   HGB 8.7* 06/13/2012   HCT 25.5* 06/13/2012   MCV 87.3 06/13/2012   PLT 153 06/13/2012  ANC1000    Chemistry      Component Value Date/Time   NA 140 06/13/2012 1052   K 3.4* 06/13/2012 1052   CL 103 06/13/2012 1052   CO2 30* 06/13/2012 1052   BUN 18.0 06/13/2012 1052   CREATININE 0.7 06/13/2012 1052      Component Value Date/Time   CALCIUM 9.0 06/13/2012 1052   ALKPHOS 99 06/13/2012 1052   AST 10 06/13/2012 1052   ALT 33 06/13/2012 1052   BILITOT 0.47 06/13/2012 1052       RADIOGRAPHIC STUDIES:  No results found.  ASSESSMENT: 47 year old female with  #1 high risk stage IIB invasive ductal carcinomaOf the right breast. The tumor is triple negative with a  Ki-67 of 25%. She is currently receiving neoadjuvant chemotherapy consisting of FEC being given dose dense.Thus far she is tolerating it well although she does become quite neutropenic.  #2 once patient completes her FEC for 4 cycles she will proceed with dose dense Taxotere for a total of 4 cycles.  #3 upon completion of her chemotherapy she will proceed with mastectomy and axillary lymph node dissection of the right breast and axilla. Post mastectomy patient will need radiation therapy likely since she did have multifocal disease. But we will leave that to the radiation oncologist   PLAN:   #1 Patient is doing well.  She does not need antibiotics, but knows to call us with fevers, chills, or any other concerns.  She is anemic and having some slight vaginal spotting associated with her normal menstrual cycle, she knows to call if her bleeding gets any heavier so we can re check her  labs.    #2 She will return next week to start Taxotere.   All questions were answered. The patient knows to call the clinic with any problems, questions or concerns. We can certainly see the patient much sooner if necessary.  I spent 25 minutes counseling the patient face to face. The total time spent in the appointment was 30 minutes.    Cherie Ouch Lyn Hollingshead, NP Medical Oncology [facility] Phone: [phone]   06/14/2012, 2:30 PM

## 2012-06-18 ENCOUNTER — Telehealth: Payer: Self-pay | Admitting: *Deleted

## 2012-06-18 NOTE — Telephone Encounter (Signed)
Per patient phone call I have moved her treatment to follow her MD visit.  JMW

## 2012-06-21 ENCOUNTER — Telehealth: Payer: Self-pay | Admitting: Oncology

## 2012-06-21 ENCOUNTER — Ambulatory Visit (HOSPITAL_BASED_OUTPATIENT_CLINIC_OR_DEPARTMENT_OTHER): Payer: BC Managed Care – PPO | Admitting: Adult Health

## 2012-06-21 ENCOUNTER — Ambulatory Visit: Payer: BC Managed Care – PPO | Admitting: Oncology

## 2012-06-21 ENCOUNTER — Other Ambulatory Visit (HOSPITAL_BASED_OUTPATIENT_CLINIC_OR_DEPARTMENT_OTHER): Payer: BC Managed Care – PPO | Admitting: Lab

## 2012-06-21 ENCOUNTER — Ambulatory Visit (HOSPITAL_BASED_OUTPATIENT_CLINIC_OR_DEPARTMENT_OTHER): Payer: BC Managed Care – PPO

## 2012-06-21 ENCOUNTER — Encounter: Payer: Self-pay | Admitting: Genetic Counselor

## 2012-06-21 ENCOUNTER — Encounter: Payer: Self-pay | Admitting: Adult Health

## 2012-06-21 ENCOUNTER — Other Ambulatory Visit: Payer: BC Managed Care – PPO | Admitting: Lab

## 2012-06-21 VITALS — BP 118/80 | HR 91 | Temp 98.4°F | Resp 20 | Ht 67.5 in | Wt 167.8 lb

## 2012-06-21 VITALS — BP 118/53 | HR 83 | Temp 98.4°F

## 2012-06-21 DIAGNOSIS — Z171 Estrogen receptor negative status [ER-]: Secondary | ICD-10-CM

## 2012-06-21 DIAGNOSIS — Z5111 Encounter for antineoplastic chemotherapy: Secondary | ICD-10-CM

## 2012-06-21 DIAGNOSIS — J329 Chronic sinusitis, unspecified: Secondary | ICD-10-CM

## 2012-06-21 DIAGNOSIS — C773 Secondary and unspecified malignant neoplasm of axilla and upper limb lymph nodes: Secondary | ICD-10-CM

## 2012-06-21 DIAGNOSIS — C50419 Malignant neoplasm of upper-outer quadrant of unspecified female breast: Secondary | ICD-10-CM

## 2012-06-21 DIAGNOSIS — C50919 Malignant neoplasm of unspecified site of unspecified female breast: Secondary | ICD-10-CM

## 2012-06-21 LAB — COMPREHENSIVE METABOLIC PANEL (CC13)
ALT: 67 U/L — ABNORMAL HIGH (ref 0–55)
Albumin: 3.1 g/dL — ABNORMAL LOW (ref 3.5–5.0)
CO2: 26 mEq/L (ref 22–29)
Calcium: 8.9 mg/dL (ref 8.4–10.4)
Chloride: 104 mEq/L (ref 98–107)
Glucose: 106 mg/dl — ABNORMAL HIGH (ref 70–99)
Potassium: 3.8 mEq/L (ref 3.5–5.1)
Sodium: 139 mEq/L (ref 136–145)
Total Protein: 6.5 g/dL (ref 6.4–8.3)

## 2012-06-21 LAB — CBC WITH DIFFERENTIAL/PLATELET
BASO%: 0.3 % (ref 0.0–2.0)
Eosinophils Absolute: 0 10*3/uL (ref 0.0–0.5)
LYMPH%: 12.6 % — ABNORMAL LOW (ref 14.0–49.7)
MCHC: 34.1 g/dL (ref 31.5–36.0)
MONO#: 0.5 10*3/uL (ref 0.1–0.9)
NEUT#: 4.9 10*3/uL (ref 1.5–6.5)
Platelets: 140 10*3/uL — ABNORMAL LOW (ref 145–400)
RBC: 2.96 10*6/uL — ABNORMAL LOW (ref 3.70–5.45)
WBC: 6.2 10*3/uL (ref 3.9–10.3)
lymph#: 0.8 10*3/uL — ABNORMAL LOW (ref 0.9–3.3)

## 2012-06-21 MED ORDER — HEPARIN SOD (PORK) LOCK FLUSH 100 UNIT/ML IV SOLN
500.0000 [IU] | Freq: Once | INTRAVENOUS | Status: AC | PRN
  Administered 2012-06-21: 500 [IU]
  Filled 2012-06-21: qty 5

## 2012-06-21 MED ORDER — DEXAMETHASONE SODIUM PHOSPHATE 10 MG/ML IJ SOLN
10.0000 mg | Freq: Once | INTRAMUSCULAR | Status: AC
  Administered 2012-06-21: 10 mg via INTRAVENOUS

## 2012-06-21 MED ORDER — AZITHROMYCIN 250 MG PO TABS: ORAL_TABLET | ORAL | Status: DC

## 2012-06-21 MED ORDER — SODIUM CHLORIDE 0.9 % IJ SOLN
10.0000 mL | INTRAMUSCULAR | Status: DC | PRN
  Administered 2012-06-21: 10 mL
  Filled 2012-06-21: qty 10

## 2012-06-21 MED ORDER — SODIUM CHLORIDE 0.9 % IV SOLN
Freq: Once | INTRAVENOUS | Status: AC
  Administered 2012-06-21: 11:00:00 via INTRAVENOUS

## 2012-06-21 MED ORDER — DOCETAXEL CHEMO INJECTION 160 MG/16ML
75.0000 mg/m2 | Freq: Once | INTRAVENOUS | Status: AC
  Administered 2012-06-21: 140 mg via INTRAVENOUS
  Filled 2012-06-21: qty 14

## 2012-06-21 MED ORDER — ONDANSETRON 8 MG/50ML IVPB (CHCC)
8.0000 mg | Freq: Once | INTRAVENOUS | Status: AC
  Administered 2012-06-21: 8 mg via INTRAVENOUS

## 2012-06-21 NOTE — Telephone Encounter (Signed)
gv pt appt schedule for February and March.  °

## 2012-06-21 NOTE — Patient Instructions (Addendum)
Doing well.  Proceed with chemotherapy.  Please call us if you have any questions or concerns.   I called you in a zpak for your nasal drainage and cough, please let us know if it worsens in any way.

## 2012-06-21 NOTE — Progress Notes (Signed)
Pt in for treatment, states she forgot to mention to NP that her incision above her port-a-cath has been itching the past few days.  Area appears red with no rash noted.  Per Candida Peeling, NP, pt may use hydrocortisone cream to area, and call office if no improvement.  Informed pt of this, and she verbalizes understanding.

## 2012-06-21 NOTE — Progress Notes (Signed)
OFFICE PROGRESS NOTE  CC  [name], MD [address] [place] [address] Dr. Fatima Sanger Dr. 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 Diagnosed November 2013. Clinical stage II (T2 N1)  PRIOR THERAPY:  #1 patient originally presented to the multidisciplinary breast clinic on 04/10/2012. She originally palpated a right breast mass. She had a mammogram performed that showed dense breasts bilaterally. Ultrasound of the right breast showed 2.3 x 1.9 cm area of abnormality with multiple abnormal lymph nodes. MRI of the bilateral breasts showed asymmetrical enhancement throughout the right breast consistent with multicentric disease. An ultrasound-guided biopsy performed. The known area of disease measured 1.9 x 1.9 x 2.3 cm. Multiple abnormal positive right axillary lymph nodes were noted. 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 Biopsy of the right axillary lymph node was positive for malignancy with extracapsular extension. Patient was originally seen by Dr. Jamey Ripa Dr. Donnie Coffin and Dr. Michell Heinrich. She has elected to have a right mastectomy eventually and declined biopsies of any other areas within the breast.  #2 after extensive discussion patient initially was started on neoadjuvant chemotherapy by Dr. Donnie Coffin. He began her on neoadjuvant FEC given dose dense every 2 weeks with day 2 Neulasta. For 4 cycles.Followed by dose dense Taxotere every 2 weeks for a total of 4 cycles. She began her chemotherapy on 04/26/2012.  She will start Taxotere on 06/21/12.    CURRENT THERAPY: Taxotere cycle 1 day 1 with Neulasta support  INTERVAL HISTORY: Nicole Valentine 47 y.o. female returns for Followup visit  For her first cycle of Taxotere.  She is feeling well today.  She does have  some nasal drainage and a cough that has been present for several days.  Her cough is productive of mucous.  She denies fevers, chills, nausea, vomiting, chest pain, shortness of breath or any other concerns.  A 10 point ROS is otherwise neg.   MEDICAL HISTORY: Past Medical History  Diagnosis Date  . Breast cancer   . Thyroid disease   . Hypothyroidism   . Contact lens/glasses fitting     wears contacts or glasses  . Complication of anesthesia     her father and uncle both had very difficult time waking up-was  something they told them may be hereditory. she will find out.    ALLERGIES:  is allergic to almond meal.  MEDICATIONS:  Current Outpatient Prescriptions  Medication Sig Dispense Refill  . Calcium Carbonate-Vitamin D (CALCIUM + D PO) Take 1 capsule by mouth daily.      . Ferrous Sulfate Dried (SLOW RELEASE IRON) 45 MG TBCR Take by mouth daily.      [name] levothyroxine (SYNTHROID, LEVOTHROID) 200 MCG tablet       . LORazepam (ATIVAN) 0.5 MG tablet       . azithromycin (ZITHROMAX) 250 MG tablet 2 tabs po x 1 on day 1, then 1 tab PO daily until complete  6 each  0  . ciprofloxacin (CIPRO) 500 MG tablet Take 1 tablet (500 mg total) by mouth 2 (two) times daily.  14 tablet  3  . dexamethasone (DECADRON) 4 MG tablet Take 2 tablets by mouth once a day on the day after chemotherapy and then take 2 tablets two times a day for 2 days. Take with food.  30  tablet  3  . HYDROcodone-acetaminophen (NORCO) 5-325 MG per tablet Take 1 tablet by mouth every 4 (four) hours as needed for pain.  30 tablet  0  . lidocaine-prilocaine (EMLA) cream Apply topically as needed.  30 g  0    SURGICAL HISTORY:  Past Surgical History  Procedure Date  . Wisdom tooth extraction   . Portacath placement 04/22/2012    Procedure: INSERTION PORT-A-CATH;  Surgeon: Currie Paris, MD;  Location: [facility];  Service: General;  Laterality: Left;    REVIEW OF SYSTEMS:  General: fatigue (-), night  sweats (-), fever (-), pain (-) Lymph: palpable nodes (-) HEENT: vision changes (-), mucositis (-), gum bleeding (-), epistaxis (-) Cardiovascular: chest pain (-), palpitations (-) Pulmonary: shortness of breath (-), dyspnea on exertion (-), cough (-), hemoptysis (-) GI:  Early satiety (-), melena (-), dysphagia (-), nausea/vomiting (-), diarrhea (-) GU: dysuria (-), hematuria (-), incontinence (-) Musculoskeletal: joint swelling (-), joint pain (-), back pain (-) Neuro: weakness (-), numbness (-), headache (-), confusion (-) Skin: Rash (-), lesions (-), dryness (-) Psych: depression (-), suicidal/homicidal ideation (-), feeling of hopelessness (-)    PHYSICAL EXAMINATION: Blood pressure 118/80, pulse 91, temperature 98.4 F (36.9 C), temperature source Oral, resp. rate 20, height 5' 7.5" (1.715 m), weight 167 lb 12.8 oz (76.114 kg). Body mass index is 25.89 kg/(m^2). General: Patient is a well appearing female in no acute distress HEENT: PERRLA, sclerae anicteric no conjunctival pallor, MMM Neck: supple, no palpable adenopathy Lungs: clear to auscultation bilaterally, no wheezes, rhonchi, or rales Cardiovascular: regular rate rhythm, S1, S2, no murmurs, rubs or gallops Abdomen: Soft, non-tender, non-distended, normoactive bowel sounds, no HSM Extremities: warm and well perfused, no clubbing, cyanosis, or edema Skin: No rashes or lesions Neuro: Non-focal ECOG PERFORMANCE STATUS: 0 - Asymptomatic Right breast still does reveal a palpable mass, much softer no axillary masses are noted left breast no masses or nipple discharge  LABORATORY DATA: Lab Results  Component Value Date   WBC 6.2 06/21/2012   HGB 9.0* 06/21/2012   HCT 26.4* 06/21/2012   MCV 89.2 06/21/2012   PLT 140* 06/21/2012  ANC1000    Chemistry      Component Value Date/Time   NA 140 06/13/2012 1052   K 3.4* 06/13/2012 1052   CL 103 06/13/2012 1052   CO2 30* 06/13/2012 1052   BUN 18.0 06/13/2012 1052   CREATININE 0.7  06/13/2012 1052      Component Value Date/Time   CALCIUM 9.0 06/13/2012 1052   ALKPHOS 99 06/13/2012 1052   AST 10 06/13/2012 1052   ALT 33 06/13/2012 1052   BILITOT 0.47 06/13/2012 1052       RADIOGRAPHIC STUDIES:  No results found.  ASSESSMENT: 47 year old female with  #1 high risk stage IIB invasive ductal carcinomaOf the right breast. The tumor is triple negative with a Ki-67 of 25%. She is currently receiving neoadjuvant chemotherapy consisting of FEC being given dose dense.Thus far she is tolerating it well although she does become quite neutropenic.  #2 once patient completes her FEC for 4 cycles she will proceed with dose dense Taxotere for a total of 4 cycles.  #3 upon completion of her chemotherapy she will proceed with mastectomy and axillary lymph node dissection of the right breast and axilla. Post mastectomy patient will need radiation therapy likely since she did have multifocal disease. But we will leave that to the radiation oncologist   PLAN:   #1 Patient is  doing well.  She will proceed with Taxotere today.  I counseled her on her nausea medication use and possible adverse effects of the new chemotherapy.   #2 I called her in a Zpak for her sinusitis.    #3 She will return next week for labs and an interim appointment.   All questions were answered. The patient knows to call the clinic with any problems, questions or concerns. We can certainly see the patient much sooner if necessary.  I spent 25 minutes counseling the patient face to face. The total time spent in the appointment was 30 minutes.  Cherie Ouch Lyn Hollingshead, NP Medical Oncology [facility] Phone: [phone] 06/21/2012, 9:25 AM

## 2012-06-21 NOTE — Patient Instructions (Addendum)
[facility] Discharge Instructions for Patients Receiving Chemotherapy  Today you received the following chemotherapy agents: Taxotere  To help prevent nausea and vomiting after your treatment, we encourage you to take your nausea medication as directed by your MD. If you develop nausea and vomiting that is not controlled by your nausea medication, call the clinic. If it is after clinic hours your family physician or the after hours number for the clinic or go to the Emergency Department.   BELOW ARE SYMPTOMS THAT SHOULD BE REPORTED IMMEDIATELY:  *FEVER GREATER THAN 100.5 F  *CHILLS WITH OR WITHOUT FEVER  NAUSEA AND VOMITING THAT IS NOT CONTROLLED WITH YOUR NAUSEA MEDICATION  *UNUSUAL SHORTNESS OF BREATH  *UNUSUAL BRUISING OR BLEEDING  TENDERNESS IN MOUTH AND THROAT WITH OR WITHOUT PRESENCE OF ULCERS  *URINARY PROBLEMS  *BOWEL PROBLEMS  UNUSUAL RASH Items with * indicate a potential emergency and should be followed up as soon as possible.  One of the nurses will contact you Monday. Please let the nurse know about any problems that you may have experienced. Feel free to call the clinic you have any questions or concerns. The clinic phone number is [phone].

## 2012-06-22 ENCOUNTER — Ambulatory Visit (HOSPITAL_BASED_OUTPATIENT_CLINIC_OR_DEPARTMENT_OTHER): Payer: BC Managed Care – PPO

## 2012-06-22 VITALS — BP 117/78 | HR 65 | Temp 98.2°F | Resp 20

## 2012-06-22 DIAGNOSIS — C773 Secondary and unspecified malignant neoplasm of axilla and upper limb lymph nodes: Secondary | ICD-10-CM

## 2012-06-22 DIAGNOSIS — Z5189 Encounter for other specified aftercare: Secondary | ICD-10-CM

## 2012-06-22 DIAGNOSIS — C50419 Malignant neoplasm of upper-outer quadrant of unspecified female breast: Secondary | ICD-10-CM

## 2012-06-22 DIAGNOSIS — C50919 Malignant neoplasm of unspecified site of unspecified female breast: Secondary | ICD-10-CM

## 2012-06-22 MED ORDER — PEGFILGRASTIM INJECTION 6 MG/0.6ML
6.0000 mg | Freq: Once | SUBCUTANEOUS | Status: AC
  Administered 2012-06-22: 6 mg via SUBCUTANEOUS

## 2012-06-24 ENCOUNTER — Telehealth: Payer: Self-pay | Admitting: *Deleted

## 2012-06-24 ENCOUNTER — Encounter: Payer: Self-pay | Admitting: Oncology

## 2012-06-24 NOTE — Telephone Encounter (Signed)
PT. HAD A MINOR SORE THROAT AND STARTED HER Z PAK. NO FEVER. NASAL DRAINAGE IS CLEAR. PT. SHARED THIS INFORMATION. SHE HAD A PERIOD IN December. TWO WEEKS AGO PT. HAD SOME SPOTTING FOR SIX DAYS. LAST NIGHT SHE HAD SOME SPOTTING BUT TODAY NOTHING. SHOULD PT. BE CONCERNED? NO PROBLEMS OR QUESTIONS PERTAINING TO CHEMOTHERAPY TREATMENT. THIS NOTE TO DR.KHAN'S NURSE, MIRA L.,RN.

## 2012-06-26 ENCOUNTER — Encounter: Payer: Self-pay | Admitting: Adult Health

## 2012-06-26 ENCOUNTER — Telehealth: Payer: Self-pay | Admitting: Medical Oncology

## 2012-06-26 ENCOUNTER — Other Ambulatory Visit: Payer: Self-pay | Admitting: *Deleted

## 2012-06-26 DIAGNOSIS — C50419 Malignant neoplasm of upper-outer quadrant of unspecified female breast: Secondary | ICD-10-CM

## 2012-06-26 NOTE — Telephone Encounter (Signed)
Tell patient to continue to observe for spotting. If it persists she may need to see her gynecologist

## 2012-06-26 NOTE — Telephone Encounter (Signed)
Patient called into triage nurse 06/24/12 "with minor sore throat and started her Z PAK. No fever. Nasal drainage is clear, patient shared this information. She had a period in December. Two weeks ago pt had dome spotting for six days. Last night she had some spotting, but today nothing. Should pt be concerned? No problems or questions pertaining to chemotherapy treatment." Next sched appt 02/07 L/NP. Will forward message to MD/NP for review.

## 2012-06-26 NOTE — Telephone Encounter (Signed)
Per MD, spoke with pt and pt to continue to observe for spotting, if it persists patient to see her gynecologist. Patient also stated she noticed hands and feet with some swelling and redness and per NP pt to make sure that she is moisturizing skin with really good lotion and NP with see her for appt 02/07. Patient verbalized understanding, no further questions at this time.

## 2012-06-27 ENCOUNTER — Encounter: Payer: Self-pay | Admitting: Family

## 2012-06-27 ENCOUNTER — Telehealth: Payer: Self-pay | Admitting: *Deleted

## 2012-06-27 ENCOUNTER — Ambulatory Visit (HOSPITAL_BASED_OUTPATIENT_CLINIC_OR_DEPARTMENT_OTHER): Payer: BC Managed Care – PPO | Admitting: Family

## 2012-06-27 ENCOUNTER — Other Ambulatory Visit (HOSPITAL_BASED_OUTPATIENT_CLINIC_OR_DEPARTMENT_OTHER): Payer: BC Managed Care – PPO

## 2012-06-27 VITALS — BP 108/70 | HR 96 | Temp 98.5°F | Resp 20 | Ht 67.5 in | Wt 171.0 lb

## 2012-06-27 DIAGNOSIS — C50919 Malignant neoplasm of unspecified site of unspecified female breast: Secondary | ICD-10-CM

## 2012-06-27 DIAGNOSIS — K1230 Oral mucositis (ulcerative), unspecified: Secondary | ICD-10-CM

## 2012-06-27 DIAGNOSIS — C50419 Malignant neoplasm of upper-outer quadrant of unspecified female breast: Secondary | ICD-10-CM

## 2012-06-27 DIAGNOSIS — E876 Hypokalemia: Secondary | ICD-10-CM

## 2012-06-27 DIAGNOSIS — L270 Generalized skin eruption due to drugs and medicaments taken internally: Secondary | ICD-10-CM

## 2012-06-27 LAB — CBC WITH DIFFERENTIAL/PLATELET
Eosinophils Absolute: 0 10*3/uL (ref 0.0–0.5)
MCV: 90.3 fL (ref 79.5–101.0)
MONO%: 15.5 % — ABNORMAL HIGH (ref 0.0–14.0)
NEUT#: 6.5 10*3/uL (ref 1.5–6.5)
RBC: 3.2 10*6/uL — ABNORMAL LOW (ref 3.70–5.45)
RDW: 24.5 % — ABNORMAL HIGH (ref 11.2–14.5)
WBC: 9.4 10*3/uL (ref 3.9–10.3)
nRBC: 3 % — ABNORMAL HIGH (ref 0–0)

## 2012-06-27 LAB — COMPREHENSIVE METABOLIC PANEL (CC13)
ALT: 32 U/L (ref 0–55)
CO2: 30 mEq/L — ABNORMAL HIGH (ref 22–29)
Calcium: 8.8 mg/dL (ref 8.4–10.4)
Chloride: 101 mEq/L (ref 98–107)
Creatinine: 0.7 mg/dL (ref 0.6–1.1)
Glucose: 109 mg/dl — ABNORMAL HIGH (ref 70–99)
Sodium: 141 mEq/L (ref 136–145)
Total Bilirubin: 0.61 mg/dL (ref 0.20–1.20)
Total Protein: 6.4 g/dL (ref 6.4–8.3)

## 2012-06-27 MED ORDER — POTASSIUM CHLORIDE CRYS ER 20 MEQ PO TBCR: 20.0000 meq | EXTENDED_RELEASE_TABLET | Freq: Every day | ORAL | Status: DC

## 2012-06-27 MED ORDER — GABAPENTIN 100 MG PO CAPS: 300.0000 mg | ORAL_CAPSULE | Freq: Three times a day (TID) | ORAL | Status: DC

## 2012-06-27 MED ORDER — ACYCLOVIR 200 MG PO CAPS: 200.0000 mg | ORAL_CAPSULE | Freq: Two times a day (BID) | ORAL | Status: DC

## 2012-06-27 MED ORDER — MAGIC MOUTHWASH: 5.0000 mL | Freq: Three times a day (TID) | ORAL | Status: DC

## 2012-06-27 MED ORDER — FLUCONAZOLE 100 MG PO TABS: 100.0000 mg | ORAL_TABLET | Freq: Every day | ORAL | Status: DC

## 2012-06-27 NOTE — Patient Instructions (Addendum)
Please contact us at [phone] if you have any questions or concerns.   A & D Ointment on hands and feet before bedtime.  Results for orders placed in visit on 06/27/12 (from the past 24 hour(s))  CBC WITH DIFFERENTIAL     Status: Abnormal   Collection Time   06/27/12  2:40 PM      Component Value Range   WBC 9.4  3.9 - 10.3 10e3/uL   NEUT# 6.5  1.5 - 6.5 10e3/uL   HGB 9.4 (*) 11.6 - 15.9 g/dL   HCT 16.1 (*) 09.6 - 04.5 %   Platelets 305  145 - 400 10e3/uL   MCV 90.3  79.5 - 101.0 fL   MCH 29.4  25.1 - 34.0 pg   MCHC 32.5  31.5 - 36.0 g/dL   RBC 4.09 (*) 8.11 - 9.14 10e6/uL   RDW 24.5 (*) 11.2 - 14.5 %   lymph# 1.3  0.9 - 3.3 10e3/uL   MONO# 1.5 (*) 0.1 - 0.9 10e3/uL   Eosinophils Absolute 0.0  0.0 - 0.5 10e3/uL   Basophils Absolute 0.2 (*) 0.0 - 0.1 10e3/uL   NEUT% 68.8  38.4 - 76.8 %   LYMPH% 13.3 (*) 14.0 - 49.7 %   MONO% 15.5 (*) 0.0 - 14.0 %   EOS% 0.0  0.0 - 7.0 %   BASO% 2.4 (*) 0.0 - 2.0 %   nRBC 3 (*) 0 - 0 %   Narrative:    Performed At:  [facility]               [address]               [place], [place] [address]  COMPREHENSIVE METABOLIC PANEL (CC13)     Status: Abnormal   Collection Time   06/27/12  2:40 PM      Component Value Range   Sodium 141  136 - 145 mEq/L   Potassium 3.1 (*) 3.5 - 5.1 mEq/L   Chloride 101  98 - 107 mEq/L   CO2 30 (*) 22 - 29 mEq/L   Glucose 109 (*) 70 - 99 mg/dl   BUN 6.5 (*) 7.0 - 62.1 mg/dL   Creatinine 0.7  0.6 - 1.1 mg/dL   Total Bilirubin 3.08  0.20 - 1.20 mg/dL   Alkaline Phosphatase 143  40 - 150 U/L   AST 19  5 - 34 U/L   ALT 32  0 - 55 U/L   Total Protein 6.4  6.4 - 8.3 g/dL   Albumin 3.1 (*) 3.5 - 5.0 g/dL   Calcium 8.8  8.4 - 65.7 mg/dL   Narrative:    Performed At:  [facility]               [address]               [place], [place] [address]

## 2012-06-27 NOTE — Telephone Encounter (Signed)
HANDS AND FEET ARE ALSO RED, STIFF, AND SENSITIVE. HER THROAT IS SORE AND GUMS ARE IRRITATED. VERBAL ORDER AND READ BACK TO DR.KHAN- PT. TO SEE JACKIE HUNTER,NP TODAY AT 3:00PM. MICHELLE BROWN,LPN MOVED PT.'S LAB AND MID LEVEL APPOINTMENT FROM TOMORROW TO THIS AFTERNOON AT 2:45PM [LAB] AND 3:00PM [JACKIE HUNTER,NP]. OBTAINED AN INFORMATION SHEET ON TAXOTERE TO BE GIVEN TO PT. AT HER APPOINTMENT. NOTIFIED PT. OF THE ABOVE INSTRUCTIONS. SHE VOICES UNDERSTANDING.

## 2012-06-27 NOTE — Progress Notes (Signed)
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 Diagnosed November 2013. Clinical stage II (T2 N1)    PRIOR THERAPY: 1.  Nicole Valentine originally presented to the multidisciplinary breast clinic on 04/10/2012. She originally palpated a right breast mass. She had a mammogram performed that showed dense breasts bilaterally. Ultrasound of the right breast showed 2.3 x 1.9 cm area of abnormality with multiple abnormal lymph nodes. MRI of the bilateral breasts showed asymmetrical enhancement throughout the right breast consistent with multicentric disease. An ultrasound-guided biopsy performed. The known area of disease measured 1.9 x 1.9 x 2.3 cm. Multiple abnormal positive right axillary lymph nodes were noted. 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 Biopsy of the right axillary lymph node was positive for malignancy with extracapsular extension. Patient was originally seen by Dr. Jamey Ripa Dr. Donnie Coffin and Dr. Michell Heinrich. She has elected to have a right mastectomy eventually and declined biopsies of any other areas within the breast.   2. After extensive discussion, the patient was started on neoadjuvant chemotherapy by Dr. Donnie Coffin. Neoadjuvant FEC dose dense every 2 weeks with day 2 Neulasta, for 4 cycles was given from 04/26/2012 - 06/07/2012.  She started chemotherapy with and a dose dense Taxotere on 06/21/2012.     CURRENT THERAPY: Dose dense Taxotere given every 2 weeks, with day 2 Neulasta support,  scheduled for 4 cycles.  She received cycle 1 of Taxotere on 06/21/2012. Cycle 2  of Taxotere is scheduled for 07/05/2012.  INTERVAL HISTORY: Dr. Darnelle Catalan and I saw Nicole Valentine for follow up visit after her first cycle of Taxotere. She is accompanied by her husband Reed Pandy for today's office visit.  Nicole Valentine had complaints of a progressively worsening sore throat, soreness in her back, general body aches and a and swelling/redness in her hands and the soles of her feet. The patient appears to have developed mucositis and hand-foot syndrome. The patient said her symptoms began within the last 24-48 hours. She denies any other symptomatology during today's office visit.  Dr. Darnelle Catalan discussed some alternatives chemotherapies equivalent Taxotere including Taxol and Abraxane. The patient was given literature on all three therapies (Taxotere, Taxol and Abraxane).  We'll let the patient know that pursuing a different chemotherapy regimen other than Taxotere will be a decision between her and Dr. Welton Flakes. The patient was given prescriptions for Diflucan, Acyclovir, Gabapentin, Magic mouthwash and KDur as she is hypokalemic.  Nicole Valentine was asked to call us in 4 days to let us know how the prescriptions are working for her.  The patient agreed to do so. He   PAST MEDICAL HISTORY: Past Medical History  Diagnosis Date  . Breast cancer   . Thyroid disease   . Hypothyroidism   . Contact lens/glasses fitting     wears contacts or glasses  . Complication of anesthesia     her father and uncle both had very difficult time waking up-was  something they told them may be hereditory. she will find out.    PAST SURGICAL HISTORY: Past Surgical History  Procedure Date  . Wisdom tooth extraction   .  Portacath placement 04/22/2012    Procedure: INSERTION PORT-A-CATH;  Surgeon: Currie Paris, MD;  Location: [facility];  Service: General;  Laterality: Left;    GYNECOLOGIC HISTORY:   FAMILY HISTORY: Family History  Problem Relation Age of Onset  . Lung cancer Maternal  Grandfather 28    smoker  . Prostate cancer Maternal Grandfather 90  . Throat cancer Other     Great Aunt x 2  . Liver cancer Other     Maternal Great Grandmother  . Melanoma Maternal Uncle 81  . Brain cancer Cousin 11    non-malignant    SOCIAL HISTORY: History  Substance Use Topics  . Smoking status: Never Smoker   . Smokeless tobacco: Never Used  . Alcohol Use: No    ALLERGIES: Allergies  Allergen Reactions  . Almond Meal     *Pt states when she eats almonds, her lips "get itchy."     MEDICATIONS:  Current Outpatient Prescriptions  Medication Sig Dispense Refill  . azithromycin (ZITHROMAX) 250 MG tablet 2 tabs po x 1 on day 1, then 1 tab PO daily until complete  6 each  0  . Calcium Carbonate-Vitamin D (CALCIUM + D PO) Take 1 capsule by mouth daily.      [name] dexamethasone (DECADRON) 4 MG tablet Take 2 tablets by mouth once a day on the day after chemotherapy and then take 2 tablets two times a day for 2 days. Take with food.  30 tablet  3  . Ferrous Sulfate Dried (SLOW RELEASE IRON) 45 MG TBCR Take by mouth daily.      [name] levothyroxine (SYNTHROID, LEVOTHROID) 200 MCG tablet       . lidocaine-prilocaine (EMLA) cream Apply topically as needed.  30 g  0  . LORazepam (ATIVAN) 0.5 MG tablet       . acyclovir (ZOVIRAX) 200 MG capsule Take 1 capsule (200 mg total) by mouth 2 (two) times daily.  60 capsule  0  . Alum & Mag Hydroxide-Simeth (MAGIC MOUTHWASH) SOLN Take 5 mLs by mouth 3 (three) times daily.  100 mL  1  . fluconazole (DIFLUCAN) 100 MG tablet Take 1 tablet (100 mg total) by mouth daily.  7 tablet  1  . gabapentin (NEURONTIN) 100 MG capsule Take 3 capsules (300 mg total) by mouth 3 (three) times daily.  90 capsule  0  . HYDROcodone-acetaminophen (NORCO) 5-325 MG per tablet Take 1 tablet by mouth every 4 (four) hours as needed for pain.  30 tablet  0  . potassium chloride SA (K-DUR,KLOR-CON) 20 MEQ tablet Take 1 tablet (20 mEq total) by mouth daily.  7 tablet  0       REVIEW OF SYSTEMS: A 10 point review of systems was completed and is negative except as noted above.    PHYSICAL EXAMINATION: BP 108/70  Pulse 96  Temp 98.5 F (36.9 C) (Oral)  Resp 20  Ht 5' 7.5" (1.715 m)  Wt 171 lb (77.565 kg)  BMI 26.39 kg/m2   General appearance: Alert, cooperative, well nourished, mild distress Head: Normocephalic, without obvious abnormality, atraumatic, oral mucosa erythema/inflammation Eyes: Conjunctivae/corneas clear, PERRLA, EOMI Nose: Nares, septum and mucosa are normal, no drainage or sinus tenderness Neck: No adenopathy, supple, symmetrical, trachea midline, thyroid not enlarged, no tenderness Resp: Clear to auscultation bilaterally Cardio: Regular rate and rhythm, S1, S2 normal, no murmur, click, rub or gallop, left chest Port-A-Cath w/o signs of infection Breasts: Not examined GI: Soft, distended, non-tender, hypoactive bowel  sounds, no organomegaly Extremities: Palms of hands and soles of feet are swollen with blotchy erythema, atraumatic, no cyanosis  Lymph nodes: Cervical, supraclavicular, and axillary nodes normal Neurologic: Grossly normal    ECOG FS:  Grade 1 - Symptomatic but completely ambulatory   LAB RESULTS: Lab Results  Component Value Date   WBC 9.4 06/27/2012   NEUTROABS 6.5 06/27/2012   HGB 9.4* 06/27/2012   HCT 28.9* 06/27/2012   MCV 90.3 06/27/2012   PLT 305 06/27/2012      Chemistry      Component Value Date/Time   NA 141 06/27/2012 1440   K 3.1* 06/27/2012 1440   CL 101 06/27/2012 1440   CO2 30* 06/27/2012 1440   BUN 6.5* 06/27/2012 1440   CREATININE 0.7 06/27/2012 1440      Component Value Date/Time   CALCIUM 8.8 06/27/2012 1440   ALKPHOS 143 06/27/2012 1440   AST 19 06/27/2012 1440   ALT 32 06/27/2012 1440   BILITOT 0.61 06/27/2012 1440       Lab Results  Component Value Date   LABCA2 34 05/24/2012     RADIOGRAPHIC STUDIES: No results found.   ASSESSMENT/PLAN: 47 y.o. [place] woman with:  1.  High risk stage IIB  invasive ductal carcinoma of the right breast. The tumor is triple negative with a Ki-67 of 25%. She received neoadjuvant chemotherapy consisting of FEC that was given dose dense. She has completed 1 of 4 cycles of scheduled dose dense Taxotere.  She may need a different chemotherapy regimen as she has had a severe reaction to Taxotere.  This will be decided by Dr. Welton Flakes.  The patient has received literature on Taxotere, Taxol and Abraxane.    2.  Mucositis and hand/foot syndrome.  The patient was given prescriptions for Acyclovir, Gabapentin, Diflucan and Magic mouthwash. The patient was also asked to use A and D ointment QHS for her bilateral hand and foot care/comfort.  She was asked to contact us on 07/01/2012 and let us know how she is feeling and how the medications are working for her.   3. Upon completion of chemotherapy she will proceed with a mastectomy and axillary lymph node dissection of the right breast and axilla. Once Nicole Valentine is status post mastectomy, she will likely need radiation therapy since she did have multifocal disease. An irradiation decision will be at the discretion of the radiation oncologist.  4.  The patient was given KDur 20 mEq PO daily x 7 days for hypokalemia and a potassium level of 3.1.  5.  We plan to see Nicole Valentine again on 07/05/2012 at which time she is scheduled to receive chemotherapy.  We will check labs of CMP and CBC at that time.    All questions were answered.  The patient and her husband were encouraged to contact us in the interim with any problems, questions or concerns.    Larina Bras, NP-C 06/27/2012, 5:25 PM

## 2012-06-28 ENCOUNTER — Other Ambulatory Visit: Payer: Self-pay | Admitting: *Deleted

## 2012-06-28 ENCOUNTER — Ambulatory Visit: Payer: BC Managed Care – PPO | Admitting: Adult Health

## 2012-06-28 ENCOUNTER — Other Ambulatory Visit: Payer: BC Managed Care – PPO

## 2012-06-28 MED ORDER — LORAZEPAM 0.5 MG PO TABS: 0.5000 mg | ORAL_TABLET | Freq: Four times a day (QID) | ORAL | Status: DC | PRN

## 2012-07-02 ENCOUNTER — Telehealth: Payer: Self-pay | Admitting: *Deleted

## 2012-07-02 NOTE — Telephone Encounter (Signed)
Patient called expressing she's trying to call as instructed after last week's f/u with Dr. Darnelle Catalan.  Asked what chemotherapy treatment she is to begin on Friday.  She has been given information on Taxol and Abraxane which she reports she's read but does not have at this at this time.  Asked how her symptoms from the Taxotere are at this time.  Reports the rash has subsided greatly however her palms feel different.  Both feet are tender and sore.  Will notify providers.  Asked if she will receive chemo on Friday.  Informed her symptoms will be re-assessed on Friday.  Also to review chemotherapy handouts given in order for she and Dr. Welton Flakes to determine what the next treatment regimen will be when she is seen February 14th visit with Dr. Milta Deiters Assistant.  Asked if this should be this hard.  Expressed that she's called, used MyChart and takes a lot of effort on her part so this is frustrating.  Expressed feeling left out and just hanging.  Applauded her for calling back and using Echart and encouraged to continue.

## 2012-07-04 ENCOUNTER — Other Ambulatory Visit: Payer: BC Managed Care – PPO | Admitting: Lab

## 2012-07-04 ENCOUNTER — Ambulatory Visit: Payer: BC Managed Care – PPO | Admitting: Oncology

## 2012-07-05 ENCOUNTER — Encounter: Payer: Self-pay | Admitting: Oncology

## 2012-07-05 ENCOUNTER — Other Ambulatory Visit (HOSPITAL_BASED_OUTPATIENT_CLINIC_OR_DEPARTMENT_OTHER): Payer: BC Managed Care – PPO | Admitting: Lab

## 2012-07-05 ENCOUNTER — Ambulatory Visit (HOSPITAL_BASED_OUTPATIENT_CLINIC_OR_DEPARTMENT_OTHER): Payer: BC Managed Care – PPO | Admitting: Oncology

## 2012-07-05 ENCOUNTER — Ambulatory Visit (HOSPITAL_BASED_OUTPATIENT_CLINIC_OR_DEPARTMENT_OTHER): Payer: BC Managed Care – PPO

## 2012-07-05 VITALS — BP 106/72 | HR 88 | Temp 97.8°F | Resp 18 | Ht 67.5 in | Wt 167.4 lb

## 2012-07-05 DIAGNOSIS — C50419 Malignant neoplasm of upper-outer quadrant of unspecified female breast: Secondary | ICD-10-CM

## 2012-07-05 DIAGNOSIS — C50919 Malignant neoplasm of unspecified site of unspecified female breast: Secondary | ICD-10-CM

## 2012-07-05 DIAGNOSIS — Z171 Estrogen receptor negative status [ER-]: Secondary | ICD-10-CM

## 2012-07-05 DIAGNOSIS — C773 Secondary and unspecified malignant neoplasm of axilla and upper limb lymph nodes: Secondary | ICD-10-CM

## 2012-07-05 DIAGNOSIS — E039 Hypothyroidism, unspecified: Secondary | ICD-10-CM

## 2012-07-05 DIAGNOSIS — Z5111 Encounter for antineoplastic chemotherapy: Secondary | ICD-10-CM

## 2012-07-05 LAB — COMPREHENSIVE METABOLIC PANEL (CC13)
ALT: 59 U/L — ABNORMAL HIGH (ref 0–55)
Albumin: 3.2 g/dL — ABNORMAL LOW (ref 3.5–5.0)
CO2: 30 mEq/L — ABNORMAL HIGH (ref 22–29)
Calcium: 9.4 mg/dL (ref 8.4–10.4)
Chloride: 103 mEq/L (ref 98–107)
Glucose: 80 mg/dl (ref 70–99)
Potassium: 4 mEq/L (ref 3.5–5.1)
Sodium: 141 mEq/L (ref 136–145)
Total Bilirubin: 0.45 mg/dL (ref 0.20–1.20)
Total Protein: 6.8 g/dL (ref 6.4–8.3)

## 2012-07-05 LAB — CBC WITH DIFFERENTIAL/PLATELET
BASO%: 0.4 % (ref 0.0–2.0)
Eosinophils Absolute: 0 10*3/uL (ref 0.0–0.5)
MCHC: 33.4 g/dL (ref 31.5–36.0)
MONO#: 0.5 10*3/uL (ref 0.1–0.9)
NEUT#: 4.5 10*3/uL (ref 1.5–6.5)
Platelets: 154 10*3/uL (ref 145–400)
RBC: 3.47 10*6/uL — ABNORMAL LOW (ref 3.70–5.45)
RDW: 28.1 % — ABNORMAL HIGH (ref 11.2–14.5)
WBC: 6 10*3/uL (ref 3.9–10.3)
lymph#: 1 10*3/uL (ref 0.9–3.3)

## 2012-07-05 LAB — TSH: TSH: 0.089 u[IU]/mL — ABNORMAL LOW (ref 0.350–4.500)

## 2012-07-05 MED ORDER — HEPARIN SOD (PORK) LOCK FLUSH 100 UNIT/ML IV SOLN
500.0000 [IU] | Freq: Once | INTRAVENOUS | Status: AC | PRN
  Administered 2012-07-05: 500 [IU]
  Filled 2012-07-05: qty 5

## 2012-07-05 MED ORDER — GEMCITABINE HCL CHEMO INJECTION 1 GM/26.3ML
800.0000 mg/m2 | Freq: Once | INTRAVENOUS | Status: AC
  Administered 2012-07-05: 1520 mg via INTRAVENOUS
  Filled 2012-07-05: qty 40.02

## 2012-07-05 MED ORDER — SODIUM CHLORIDE 0.9 % IJ SOLN
10.0000 mL | INTRAMUSCULAR | Status: DC | PRN
  Administered 2012-07-05: 10 mL
  Filled 2012-07-05: qty 10

## 2012-07-05 MED ORDER — ONDANSETRON 8 MG/50ML IVPB (CHCC): 8.0000 mg | Freq: Once | INTRAVENOUS | Status: DC

## 2012-07-05 MED ORDER — SODIUM CHLORIDE 0.9 % IV SOLN
730.0000 mg | Freq: Once | INTRAVENOUS | Status: AC
  Administered 2012-07-05: 730 mg via INTRAVENOUS
  Filled 2012-07-05: qty 73

## 2012-07-05 MED ORDER — DEXAMETHASONE SODIUM PHOSPHATE 10 MG/ML IJ SOLN: 10.0000 mg | Freq: Once | INTRAMUSCULAR | Status: DC

## 2012-07-05 MED ORDER — SODIUM CHLORIDE 0.9 % IV SOLN: Freq: Once | INTRAVENOUS | Status: DC

## 2012-07-05 NOTE — Patient Instructions (Signed)
[facility] Discharge Instructions for Patients Receiving Chemotherapy  Today you received the following chemotherapy agents: Gemzar, Carboplatin  To help prevent nausea and vomiting after your treatment, we encourage you to take your nausea medication as directed by your MD.   If you develop nausea and vomiting that is not controlled by your nausea medication, call the clinic. If it is after clinic hours your family physician or the after hours number for the clinic or go to the Emergency Department.   BELOW ARE SYMPTOMS THAT SHOULD BE REPORTED IMMEDIATELY:  *FEVER GREATER THAN 100.5 F  *CHILLS WITH OR WITHOUT FEVER  NAUSEA AND VOMITING THAT IS NOT CONTROLLED WITH YOUR NAUSEA MEDICATION  *UNUSUAL SHORTNESS OF BREATH  *UNUSUAL BRUISING OR BLEEDING  TENDERNESS IN MOUTH AND THROAT WITH OR WITHOUT PRESENCE OF ULCERS  *URINARY PROBLEMS  *BOWEL PROBLEMS  UNUSUAL RASH Items with * indicate a potential emergency and should be followed up as soon as possible.   Feel free to call the clinic you have any questions or concerns. The clinic phone number is [phone].

## 2012-07-05 NOTE — Patient Instructions (Addendum)
Proceed with   Chemotherapy (gemzar/carboplatin)  Gemcitabine injection What is this medicine? GEMCITABINE (jem SIT a been) is a chemotherapy drug. This medicine is used to treat many types of cancer like breast cancer, lung cancer, pancreatic cancer, and ovarian cancer. This medicine may be used for other purposes; ask your health care provider or pharmacist if you have questions. What should I tell my health care provider before I take this medicine? They need to know if you have any of these conditions: -blood disorders -infection -kidney disease -liver disease -recent or ongoing radiation therapy -an unusual or allergic reaction to gemcitabine, other chemotherapy, other medicines, foods, dyes, or preservatives -pregnant or trying to get pregnant -breast-feeding How should I use this medicine? This drug is given as an infusion into a vein. It is administered in a hospital or clinic by a specially trained health care professional. Talk to your pediatrician regarding the use of this medicine in children. Special care may be needed. Overdosage: If you think you have taken too much of this medicine contact a poison control center or emergency room at once. NOTE: This medicine is only for you. Do not share this medicine with others. What if I miss a dose? It is important not to miss your dose. Call your doctor or health care professional if you are unable to keep an appointment. What may interact with this medicine? -medicines to increase blood counts like filgrastim, pegfilgrastim, sargramostim -some other chemotherapy drugs like cisplatin -vaccines Talk to your doctor or health care professional before taking any of these medicines: -acetaminophen -aspirin -ibuprofen -ketoprofen -naproxen This list may not describe all possible interactions. Give your health care provider a list of all the medicines, herbs, non-prescription drugs, or dietary supplements you use. Also tell them if you  smoke, drink alcohol, or use illegal drugs. Some items may interact with your medicine. What should I watch for while using this medicine? Visit your doctor for checks on your progress. This drug may make you feel generally unwell. This is not uncommon, as chemotherapy can affect healthy cells as well as cancer cells. Report any side effects. Continue your course of treatment even though you feel ill unless your doctor tells you to stop. In some cases, you may be given additional medicines to help with side effects. Follow all directions for their use. Call your doctor or health care professional for advice if you get a fever, chills or sore throat, or other symptoms of a cold or flu. Do not treat yourself. This drug decreases your body's ability to fight infections. Try to avoid being around people who are sick. This medicine may increase your risk to bruise or bleed. Call your doctor or health care professional if you notice any unusual bleeding. Be careful brushing and flossing your teeth or using a toothpick because you may get an infection or bleed more easily. If you have any dental work done, tell your dentist you are receiving this medicine. Avoid taking products that contain aspirin, acetaminophen, ibuprofen, naproxen, or ketoprofen unless instructed by your doctor. These medicines may hide a fever. Women should inform their doctor if they wish to become pregnant or think they might be pregnant. There is a potential for serious side effects to an unborn child. Talk to your health care professional or pharmacist for more information. Do not breast-feed an infant while taking this medicine. What side effects may I notice from receiving this medicine? Side effects that you should report to your doctor or health  care professional as soon as possible: -allergic reactions like skin rash, itching or hives, swelling of the face, lips, or tongue -low blood counts - this medicine may decrease the number  of white blood cells, red blood cells and platelets. You may be at increased risk for infections and bleeding. -signs of infection - fever or chills, cough, sore throat, pain or difficulty passing urine -signs of decreased platelets or bleeding - bruising, pinpoint red spots on the skin, black, tarry stools, blood in the urine -signs of decreased red blood cells - unusually weak or tired, fainting spells, lightheadedness -breathing problems -chest pain -mouth sores -nausea and vomiting -pain, swelling, redness at site where injected -pain, tingling, numbness in the hands or feet -stomach pain -swelling of ankles, feet, hands -unusual bleeding Side effects that usually do not require medical attention (report to your doctor or health care professional if they continue or are bothersome): -constipation -diarrhea -hair loss -loss of appetite -stomach upset This list may not describe all possible side effects. Call your doctor for medical advice about side effects. You may report side effects to FDA at 1-800-FDA-1088. Where should I keep my medicine? This drug is given in a hospital or clinic and will not be stored at home. NOTE: This sheet is a summary. It may not cover all possible information. If you have questions about this medicine, talk to your doctor, pharmacist, or health care provider.  2013, [employer]. (09/17/2007 6:45:54 PM)  Carboplatin injection What is this medicine? CARBOPLATIN (KAR boe pla tin) is a chemotherapy drug. It targets fast dividing cells, like cancer cells, and causes these cells to die. This medicine is used to treat ovarian cancer and many other cancers. This medicine may be used for other purposes; ask your health care provider or pharmacist if you have questions. What should I tell my health care provider before I take this medicine? They need to know if you have any of these conditions: -blood disorders -hearing problems -kidney  disease -recent or ongoing radiation therapy -an unusual or allergic reaction to carboplatin, cisplatin, other chemotherapy, other medicines, foods, dyes, or preservatives -pregnant or trying to get pregnant -breast-feeding How should I use this medicine? This drug is usually given as an infusion into a vein. It is administered in a hospital or clinic by a specially trained health care professional. Talk to your pediatrician regarding the use of this medicine in children. Special care may be needed. Overdosage: If you think you have taken too much of this medicine contact a poison control center or emergency room at once. NOTE: This medicine is only for you. Do not share this medicine with others. What if I miss a dose? It is important not to miss a dose. Call your doctor or health care professional if you are unable to keep an appointment. What may interact with this medicine? -medicines for seizures -medicines to increase blood counts like filgrastim, pegfilgrastim, sargramostim -some antibiotics like amikacin, gentamicin, neomycin, streptomycin, tobramycin -vaccines Talk to your doctor or health care professional before taking any of these medicines: -acetaminophen -aspirin -ibuprofen -ketoprofen -naproxen This list may not describe all possible interactions. Give your health care provider a list of all the medicines, herbs, non-prescription drugs, or dietary supplements you use. Also tell them if you smoke, drink alcohol, or use illegal drugs. Some items may interact with your medicine. What should I watch for while using this medicine? Your condition will be monitored carefully while you are receiving this medicine. You will  need important blood work done while you are taking this medicine. This drug may make you feel generally unwell. This is not uncommon, as chemotherapy can affect healthy cells as well as cancer cells. Report any side effects. Continue your course of treatment even  though you feel ill unless your doctor tells you to stop. In some cases, you may be given additional medicines to help with side effects. Follow all directions for their use. Call your doctor or health care professional for advice if you get a fever, chills or sore throat, or other symptoms of a cold or flu. Do not treat yourself. This drug decreases your body's ability to fight infections. Try to avoid being around people who are sick. This medicine may increase your risk to bruise or bleed. Call your doctor or health care professional if you notice any unusual bleeding. Be careful brushing and flossing your teeth or using a toothpick because you may get an infection or bleed more easily. If you have any dental work done, tell your dentist you are receiving this medicine. Avoid taking products that contain aspirin, acetaminophen, ibuprofen, naproxen, or ketoprofen unless instructed by your doctor. These medicines may hide a fever. Do not become pregnant while taking this medicine. Women should inform their doctor if they wish to become pregnant or think they might be pregnant. There is a potential for serious side effects to an unborn child. Talk to your health care professional or pharmacist for more information. Do not breast-feed an infant while taking this medicine. What side effects may I notice from receiving this medicine? Side effects that you should report to your doctor or health care professional as soon as possible: -allergic reactions like skin rash, itching or hives, swelling of the face, lips, or tongue -signs of infection - fever or chills, cough, sore throat, pain or difficulty passing urine -signs of decreased platelets or bleeding - bruising, pinpoint red spots on the skin, black, tarry stools, nosebleeds -signs of decreased red blood cells - unusually weak or tired, fainting spells, lightheadedness -breathing problems -changes in hearing -changes in vision -chest pain -high  blood pressure -low blood counts - This drug may decrease the number of white blood cells, red blood cells and platelets. You may be at increased risk for infections and bleeding. -nausea and vomiting -pain, swelling, redness or irritation at the injection site -pain, tingling, numbness in the hands or feet -problems with balance, talking, walking -trouble passing urine or change in the amount of urine Side effects that usually do not require medical attention (report to your doctor or health care professional if they continue or are bothersome): -hair loss -loss of appetite -metallic taste in the mouth or changes in taste This list may not describe all possible side effects. Call your doctor for medical advice about side effects. You may report side effects to FDA at 1-800-FDA-1088. Where should I keep my medicine? This drug is given in a hospital or clinic and will not be stored at home. NOTE: This sheet is a summary. It may not cover all possible information. If you have questions about this medicine, talk to your doctor, pharmacist, or health care provider.  2012, [employer] Standard. (08/13/2007 2:38:05 PM)

## 2012-07-06 ENCOUNTER — Other Ambulatory Visit: Payer: Self-pay

## 2012-07-06 ENCOUNTER — Ambulatory Visit (HOSPITAL_BASED_OUTPATIENT_CLINIC_OR_DEPARTMENT_OTHER): Payer: BC Managed Care – PPO

## 2012-07-06 VITALS — BP 119/79 | HR 70 | Temp 97.6°F

## 2012-07-06 DIAGNOSIS — C50419 Malignant neoplasm of upper-outer quadrant of unspecified female breast: Secondary | ICD-10-CM

## 2012-07-06 DIAGNOSIS — C50919 Malignant neoplasm of unspecified site of unspecified female breast: Secondary | ICD-10-CM

## 2012-07-06 DIAGNOSIS — C773 Secondary and unspecified malignant neoplasm of axilla and upper limb lymph nodes: Secondary | ICD-10-CM

## 2012-07-06 MED ORDER — PEGFILGRASTIM INJECTION 6 MG/0.6ML
6.0000 mg | Freq: Once | SUBCUTANEOUS | Status: AC
  Administered 2012-07-06: 6 mg via SUBCUTANEOUS

## 2012-07-08 ENCOUNTER — Telehealth: Payer: Self-pay | Admitting: *Deleted

## 2012-07-08 NOTE — Progress Notes (Signed)
OFFICE PROGRESS NOTE  CC  Nicole Found, MD [address] [place] [address] Dr. Fatima Sanger Dr. 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 Diagnosed November 2013. Clinical stage II (T2 N1)  PRIOR THERAPY:  #1 patient originally presented to the multidisciplinary breast clinic on 04/10/2012. She originally palpated a right breast mass. She had a mammogram performed that showed dense breasts bilaterally. Ultrasound of the right breast showed 2.3 x 1.9 cm area of abnormality with multiple abnormal lymph nodes. MRI of the bilateral breasts showed asymmetrical enhancement throughout the right breast consistent with multicentric disease. An ultrasound-guided biopsy performed. The known area of disease measured 1.9 x 1.9 x 2.3 cm. Multiple abnormal positive right axillary lymph nodes were noted. 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 Biopsy of the right axillary lymph node was positive for malignancy with extracapsular extension. Patient was originally seen by Dr. Jamey Ripa Dr. Donnie Coffin and Dr. Michell Heinrich. She has elected to have a right mastectomy eventually and declined biopsies of any other areas within the breast.  #2 after extensive discussion patient initially was started on neoadjuvant chemotherapy by Dr. Donnie Coffin. He began her on neoadjuvant FEC given dose dense every 2 weeks with day 2 Neulasta. For 4 cycles.Followed by dose dense Taxotere every 2 weeks for a total of 4 cycles. She began her chemotherapy on 04/26/2012 - January 2014. She then began Taxotere on 06/21/2012 however she developed hand-foot syndrome and that was discontinued.  #3 patient will now begin Gemzar carboplatinum on 07/05/2012. A total of 6 cycles of treatment are planned. Risks and benefits of this have  been discussed with the patient.  CURRENT THERAPY: proceed with cycle 1 day 1 of Gemzar carbo.  INTERVAL HISTORY: Nicole Valentine 47 y.o. female returns for Followup visit overall she's doing well. Her hands and feet redness and rash has improved significantly although her fingers are still tender. She otherwise is denying any nausea vomiting fevers chills night sweats headaches no shortness of breath or chest pains or palpitations no myalgias and arthralgias. Remainder of the 10 point review of systems is negative.  MEDICAL HISTORY: Past Medical History  Diagnosis Date  . Breast cancer   . Thyroid disease   . Hypothyroidism   . Contact lens/glasses fitting     wears contacts or glasses  . Complication of anesthesia     her father and uncle both had very difficult time waking up-was  something they told them may be hereditory. she will find out.    ALLERGIES:  is allergic to almond meal.  MEDICATIONS:  Current Outpatient Prescriptions  Medication Sig Dispense Refill  . acyclovir (ZOVIRAX) 200 MG capsule Take 1 capsule (200 mg total) by mouth 2 (two) times daily.  60 capsule  0  . Ascorbic Acid (VITAMIN C) 1000 MG tablet Take 1,000 mg by mouth daily.      . Calcium Carbonate-Vitamin D (CALCIUM + D PO) Take 1 capsule by mouth daily.      . Cholecalciferol (VITAMIN D-3 PO) Take 2,000 Int'l Units by mouth.      . Cyanocobalamin (VITAMIN B-12 PO) Take 5,000 mg by mouth daily.      [name] dexamethasone (DECADRON) 4 MG tablet Take 2 tablets by mouth once a day on the day after chemotherapy and then take 2  tablets two times a day for 2 days. Take with food.  30 tablet  3  . Ferrous Sulfate Dried (SLOW RELEASE IRON) 45 MG TBCR Take 65 mg by mouth daily.       [name] gabapentin (NEURONTIN) 100 MG capsule Take 3 capsules (300 mg total) by mouth 3 (three) times daily.  90 capsule  0  . levothyroxine (SYNTHROID, LEVOTHROID) 200 MCG tablet 75 mcg.       . lidocaine-prilocaine (EMLA) cream Apply topically as  needed.  30 g  0  . LORazepam (ATIVAN) 0.5 MG tablet Take 1 tablet (0.5 mg total) by mouth every 6 (six) hours as needed for anxiety.  30 tablet  1  . Alum & Mag Hydroxide-Simeth (MAGIC MOUTHWASH) SOLN Take 5 mLs by mouth 3 (three) times daily.  100 mL  1  . azithromycin (ZITHROMAX) 250 MG tablet 2 tabs po x 1 on day 1, then 1 tab PO daily until complete  6 each  0  . fluconazole (DIFLUCAN) 100 MG tablet Take 1 tablet (100 mg total) by mouth daily.  7 tablet  1  . HYDROcodone-acetaminophen (NORCO) 5-325 MG per tablet Take 1 tablet by mouth every 4 (four) hours as needed for pain.  30 tablet  0  . potassium chloride SA (K-DUR,KLOR-CON) 20 MEQ tablet Take 1 tablet (20 mEq total) by mouth daily.  7 tablet  0   No current facility-administered medications for this visit.    SURGICAL HISTORY:  Past Surgical History  Procedure Laterality Date  . Wisdom tooth extraction    . Portacath placement  04/22/2012    Procedure: INSERTION PORT-A-CATH;  Surgeon: Currie Paris, MD;  Location: [facility];  Service: General;  Laterality: Left;    REVIEW OF SYSTEMS:  General: fatigue (-), night sweats (-), fever (-), pain (-) Lymph: palpable nodes (-) HEENT: vision changes (-), mucositis (-), gum bleeding (-), epistaxis (-) Cardiovascular: chest pain (-), palpitations (-) Pulmonary: shortness of breath (-), dyspnea on exertion (-), cough (-), hemoptysis (-) GI:  Early satiety (-), melena (-), dysphagia (-), nausea/vomiting (-), diarrhea (-) GU: dysuria (-), hematuria (-), incontinence (-) Musculoskeletal: joint swelling (-), joint pain (-), back pain (-) Neuro: weakness (-), numbness (-), headache (-), confusion (-) Skin: Rash (-), lesions (-), dryness (-) Psych: depression (-), suicidal/homicidal ideation (-), feeling of hopelessness (-)    PHYSICAL EXAMINATION: Blood pressure 106/72, pulse 88, temperature 97.8 F (36.6 C), temperature source Oral, resp. rate 18, height 5' 7.5"  (1.715 m), weight 167 lb 6 oz (75.921 kg). Body mass index is 25.81 kg/(m^2). General: Patient is a well appearing female in no acute distress HEENT: PERRLA, sclerae anicteric no conjunctival pallor, MMM Neck: supple, no palpable adenopathy Lungs: clear to auscultation bilaterally, no wheezes, rhonchi, or rales Cardiovascular: regular rate rhythm, S1, S2, no murmurs, rubs or gallops Abdomen: Soft, non-tender, non-distended, normoactive bowel sounds, no HSM Extremities: warm and well perfused, no clubbing, cyanosis, or edema Skin: No rashes or lesions Neuro: Non-focal ECOG PERFORMANCE STATUS: 0 - Asymptomatic Right breast still does reveal a palpable mass, much softer no axillary masses are noted left breast no masses or nipple discharge  LABORATORY DATA: Lab Results  Component Value Date   WBC 6.0 07/05/2012   HGB 10.8* 07/05/2012   HCT 32.3* 07/05/2012   MCV 93.1 07/05/2012   PLT 154 07/05/2012  ANC1000    Chemistry      Component Value Date/Time   NA 141 07/05/2012 1053  K 4.0 07/05/2012 1053   CL 103 07/05/2012 1053   CO2 30* 07/05/2012 1053   BUN 12.8 07/05/2012 1053   CREATININE 0.7 07/05/2012 1053      Component Value Date/Time   CALCIUM 9.4 07/05/2012 1053   ALKPHOS 154* 07/05/2012 1053   AST 34 07/05/2012 1053   ALT 59* 07/05/2012 1053   BILITOT 0.45 07/05/2012 1053       RADIOGRAPHIC STUDIES:  No results Valentine.  ASSESSMENT: 47 year old female with  #1 high risk stage IIB invasive ductal carcinomaOf the right breast. The tumor is triple negative with a Ki-67 of 25%. She is currently receiving neoadjuvant chemotherapy consisting of FEC being given dose dense.Thus far she is tolerating it well although she does become quite neutropenic.  #2 patient has completed 4 cycles of 5-FU epirubicin and Cytoxan from 04/26/2012 - 06/14/2012.  #3 she then began Taxotere on 06/21/2012 however this was discontinued after one cycle as patient could not tolerated because of development of  hand-foot syndrome.  #4 starting on 07/05/2012 patient was begun on Gemzar carboplatinum to be given every 14 days for a total of 6 cycles. Asks and benefits of this were discussed with the patient.  #3 upon completion of her chemotherapy she will proceed with mastectomy and axillary lymph node dissection of the right breast and axilla. Post mastectomy patient will need radiation therapy likely since she did have multifocal disease. But we will leave that to the radiation oncologist   PLAN:  #1 patient will proceed with Gemzar carboplatinum today cycle 1 day 1.  #2 she will return in one week's time for interim labs.  #3 cycle 2 of Gemzar carboplatinum will be on 07/19/2012.  All questions were answered. The patient knows to call the clinic with any problems, questions or concerns. We can certainly see the patient much sooner if necessary.  I spent 25 minutes counseling the patient face to face. The total time spent in the appointment was 30 minutes.  Drue Second, MD Medical/Oncology [facility] [phone] (beeper) [phone] (Office)

## 2012-07-08 NOTE — Telephone Encounter (Signed)
Message left requesting a return call from patient.  Started new treatment on 07-05-2012.  Encouraged to eat and drink and to call with update.  Awaiting return call from patient.

## 2012-07-08 NOTE — Telephone Encounter (Signed)
Message copied by Augusto Garbe on Mon Jul 08, 2012 12:39 PM ------      Message from: Caleb Popp      Created: Fri Jul 05, 2012  7:11 PM      Regarding: chemo follow up call       Carbo/ Gemzar 07/05/12. Dr. Welton Flakes (regimen change after hand/foot syndrome and mucositis with Taxotere) ------

## 2012-07-09 ENCOUNTER — Telehealth: Payer: Self-pay | Admitting: Genetic Counselor

## 2012-07-09 NOTE — Telephone Encounter (Signed)
Revealed negative genetic test results 

## 2012-07-10 ENCOUNTER — Encounter: Payer: Self-pay | Admitting: Genetic Counselor

## 2012-07-11 ENCOUNTER — Telehealth: Payer: Self-pay | Admitting: *Deleted

## 2012-07-11 NOTE — Telephone Encounter (Signed)
Patient called reporting she needs her TSH levels faxed to her PCP for a refill.  Reports Dr. Leonides Schanz an authorization for records release and sent this in last week.  This nurse manually faxed TSH level to Dr. Merri Brunette with [facility] number [phone].  At 1:24 pm called Shamila to let her know the fax was transmited with success.

## 2012-07-12 ENCOUNTER — Ambulatory Visit (HOSPITAL_BASED_OUTPATIENT_CLINIC_OR_DEPARTMENT_OTHER): Payer: BC Managed Care – PPO | Admitting: Adult Health

## 2012-07-12 ENCOUNTER — Other Ambulatory Visit: Payer: Self-pay | Admitting: Oncology

## 2012-07-12 ENCOUNTER — Telehealth: Payer: Self-pay | Admitting: Oncology

## 2012-07-12 ENCOUNTER — Other Ambulatory Visit: Payer: BC Managed Care – PPO

## 2012-07-12 ENCOUNTER — Encounter: Payer: Self-pay | Admitting: Adult Health

## 2012-07-12 VITALS — BP 121/76 | HR 82 | Temp 98.2°F | Resp 20 | Ht 67.5 in | Wt 171.2 lb

## 2012-07-12 DIAGNOSIS — C773 Secondary and unspecified malignant neoplasm of axilla and upper limb lymph nodes: Secondary | ICD-10-CM

## 2012-07-12 DIAGNOSIS — C50419 Malignant neoplasm of upper-outer quadrant of unspecified female breast: Secondary | ICD-10-CM

## 2012-07-12 DIAGNOSIS — Z171 Estrogen receptor negative status [ER-]: Secondary | ICD-10-CM

## 2012-07-12 DIAGNOSIS — Z79899 Other long term (current) drug therapy: Secondary | ICD-10-CM

## 2012-07-12 LAB — COMPREHENSIVE METABOLIC PANEL (CC13)
ALT: 24 U/L (ref 0–55)
Alkaline Phosphatase: 123 U/L (ref 40–150)
Creatinine: 0.6 mg/dL (ref 0.6–1.1)
Sodium: 140 mEq/L (ref 136–145)
Total Bilirubin: 0.83 mg/dL (ref 0.20–1.20)
Total Protein: 5.8 g/dL — ABNORMAL LOW (ref 6.4–8.3)

## 2012-07-12 LAB — CBC WITH DIFFERENTIAL/PLATELET
BASO%: 0.4 % (ref 0.0–2.0)
LYMPH%: 11.3 % — ABNORMAL LOW (ref 14.0–49.7)
MCH: 32.1 pg (ref 25.1–34.0)
MCHC: 34 g/dL (ref 31.5–36.0)
MCV: 94.4 fL (ref 79.5–101.0)
MONO%: 1.9 % (ref 0.0–14.0)
Platelets: 61 10*3/uL — ABNORMAL LOW (ref 145–400)
RBC: 2.71 10*6/uL — ABNORMAL LOW (ref 3.70–5.45)
WBC: 7.2 10*3/uL (ref 3.9–10.3)

## 2012-07-12 NOTE — Telephone Encounter (Signed)
No new appts. Pt aware that echo will need preauth before being scheduled. Pt aware she will be contacted w/echo appt. Order to linda for preauth.

## 2012-07-12 NOTE — Patient Instructions (Addendum)

## 2012-07-12 NOTE — Progress Notes (Signed)
OFFICE PROGRESS NOTE  CC  [name], MD [address] [place] [address] Dr. Fatima Sanger Dr. 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 Diagnosed November 2013. Clinical stage II (T2 N1)  PRIOR THERAPY:  #1 patient originally presented to the multidisciplinary breast clinic on 04/10/2012. She originally palpated a right breast mass. She had a mammogram performed that showed dense breasts bilaterally. Ultrasound of the right breast showed 2.3 x 1.9 cm area of abnormality with multiple abnormal lymph nodes. MRI of the bilateral breasts showed asymmetrical enhancement throughout the right breast consistent with multicentric disease. An ultrasound-guided biopsy performed. The known area of disease measured 1.9 x 1.9 x 2.3 cm. Multiple abnormal positive right axillary lymph nodes were noted. 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 Biopsy of the right axillary lymph node was positive for malignancy with extracapsular extension. Patient was originally seen by Dr. Jamey Ripa Dr. Donnie Coffin and Dr. Michell Heinrich. She has elected to have a right mastectomy eventually and declined biopsies of any other areas within the breast.  #2 after extensive discussion patient initially was started on neoadjuvant chemotherapy by Dr. Donnie Coffin. He began her on neoadjuvant FEC given dose dense every 2 weeks with day 2 Neulasta. For 4 cycles.Followed by dose dense Taxotere every 2 weeks for a total of 4 cycles. She began her chemotherapy on 04/26/2012.  She will start Taxotere on 06/21/12.  She unfortunately had a bad hand-foot reaction and this was discontinued.  She will proceed with Gemzar/Carbo every 2 weeks.    CURRENT THERAPY: Gemzar/Carbo Cycle 1 day 8  INTERVAL HISTORY: Armya Westerhoff 47 y.o.  female returns for Followup visit after cycle 2 of Gemzar/carbo.  She tolerated the medication well.  She is requesting an echo to evaluate her cardiac function since she completed the FEC.  She denies shortness of breath, chest pain, lower extremity swelling, or any other concerns.  She also would like to change her Neulasta timing for her chemotherapy on 3/14.  Otherwise she is doing well and a 10 point ROS is neg.   MEDICAL HISTORY: Past Medical History  Diagnosis Date  . Breast cancer   . Thyroid disease   . Hypothyroidism   . Contact lens/glasses fitting     wears contacts or glasses  . Complication of anesthesia     her father and uncle both had very difficult time waking up-was  something they told them may be hereditory. she will find out.    ALLERGIES:  is allergic to almond meal.  MEDICATIONS:  Current Outpatient Prescriptions  Medication Sig Dispense Refill  . Alum & Mag Hydroxide-Simeth (MAGIC MOUTHWASH) SOLN Take 5 mLs by mouth 3 (three) times daily.  100 mL  1  . Ascorbic Acid (VITAMIN C) 1000 MG tablet Take 1,000 mg by mouth daily.      . Calcium Carbonate-Vitamin D (CALCIUM + D PO) Take 1 capsule by mouth daily.      . Cholecalciferol (VITAMIN D-3 PO) Take 2,000 Int'l Units by mouth.      . Cyanocobalamin (VITAMIN B-12 PO) Take 5,000 mg by mouth daily.      [name] dexamethasone (DECADRON) 4 MG tablet Take 2 tablets by mouth once a day on the day after chemotherapy and then take 2 tablets two times a day for 2  days. Take with food.  30 tablet  3  . Ferrous Sulfate Dried (SLOW RELEASE IRON) 45 MG TBCR Take 65 mg by mouth daily.       [name] levothyroxine (SYNTHROID, LEVOTHROID) 200 MCG tablet 150 mcg.       . lidocaine-prilocaine (EMLA) cream Apply topically as needed.  30 g  0  . LORazepam (ATIVAN) 0.5 MG tablet Take 1 tablet (0.5 mg total) by mouth every 6 (six) hours as needed for anxiety.  30 tablet  1   No current facility-administered medications for this visit.    SURGICAL  HISTORY:  Past Surgical History  Procedure Laterality Date  . Wisdom tooth extraction    . Portacath placement  04/22/2012    Procedure: INSERTION PORT-A-CATH;  Surgeon: Currie Paris, MD;  Location: [facility];  Service: General;  Laterality: Left;    REVIEW OF SYSTEMS:  General: fatigue (-), night sweats (-), fever (-), pain (-) Lymph: palpable nodes (-) HEENT: vision changes (-), mucositis (-), gum bleeding (-), epistaxis (-) Cardiovascular: chest pain (-), palpitations (-) Pulmonary: shortness of breath (-), dyspnea on exertion (-), cough (-), hemoptysis (-) GI:  Early satiety (-), melena (-), dysphagia (-), nausea/vomiting (-), diarrhea (-) GU: dysuria (-), hematuria (-), incontinence (-) Musculoskeletal: joint swelling (-), joint pain (-), back pain (-) Neuro: weakness (-), numbness (-), headache (-), confusion (-) Skin: Rash (-), lesions (-), dryness (-) Psych: depression (-), suicidal/homicidal ideation (-), feeling of hopelessness (-)    PHYSICAL EXAMINATION: Blood pressure 121/76, pulse 82, temperature 98.2 F (36.8 C), temperature source Oral, resp. rate 20, height 5' 7.5" (1.715 m), weight 171 lb 3.2 oz (77.656 kg). Body mass index is 26.4 kg/(m^2). General: Patient is a well appearing female in no acute distress HEENT: PERRLA, sclerae anicteric no conjunctival pallor, MMM Neck: supple, no palpable adenopathy Lungs: clear to auscultation bilaterally, no wheezes, rhonchi, or rales Cardiovascular: regular rate rhythm, S1, S2, no murmurs, rubs or gallops Abdomen: Soft, non-tender, non-distended, normoactive bowel sounds, no HSM Extremities: warm and well perfused, no clubbing, cyanosis, or edema Skin: No rashes or lesions Neuro: Non-focal ECOG PERFORMANCE STATUS: 0 - Asymptomatic Right breast still does reveal a palpable mass, much softer no axillary masses are noted left breast no masses or nipple discharge  LABORATORY DATA: Lab Results  Component  Value Date   WBC 7.2 07/12/2012   HGB 8.7* 07/12/2012   HCT 25.5* 07/12/2012   MCV 94.4 07/12/2012   PLT 61* 07/12/2012  ANC1000    Chemistry      Component Value Date/Time   NA 140 07/12/2012 1049   K 3.4* 07/12/2012 1049   CL 101 07/12/2012 1049   CO2 32* 07/12/2012 1049   BUN 12.1 07/12/2012 1049   CREATININE 0.6 07/12/2012 1049      Component Value Date/Time   CALCIUM 8.8 07/12/2012 1049   ALKPHOS 123 07/12/2012 1049   AST 10 07/12/2012 1049   ALT 24 07/12/2012 1049   BILITOT 0.83 07/12/2012 1049       RADIOGRAPHIC STUDIES:  No results found.  ASSESSMENT: 47 year old female with  #1 high risk stage IIB invasive ductal carcinomaOf the right breast. The tumor is triple negative with a Ki-67 of 25%. She is currently receiving neoadjuvant chemotherapy consisting of FEC being given dose dense.Thus far she is tolerating it well although she does become quite neutropenic.  #2 once patient completes her FEC for 4 cycles she will proceed with dose dense Taxotere for a total  of 4 cycles.  Thsi was changed to Gemzar Carbo after hand-foot reaction to taxotere.    #3 upon completion of her chemotherapy she will proceed with mastectomy and axillary lymph node dissection of the right breast and axilla. Post mastectomy patient will need radiation therapy likely since she did have multifocal disease. But we will leave that to the radiation oncologist   PLAN:  1. Ms. Panek tolerated chemotherapy well.  Her counts did lower as expected.  She is not neutropenic and doing well today.   2. We will see her back next week for cycle 2 of Gemzar Carbo.   All questions were answered. The patient knows to call the clinic with any problems, questions or concerns. We can certainly see the patient much sooner if necessary.  I spent 25 minutes counseling the patient face to face. The total time spent in the appointment was 30 minutes.  Cherie Ouch Lyn Hollingshead, NP Medical Oncology [facility] Phone: [phone] 07/12/2012, 3:25 PM

## 2012-07-15 ENCOUNTER — Telehealth: Payer: Self-pay | Admitting: Oncology

## 2012-07-15 ENCOUNTER — Telehealth: Payer: Self-pay | Admitting: *Deleted

## 2012-07-15 ENCOUNTER — Encounter: Payer: Self-pay | Admitting: Oncology

## 2012-07-15 NOTE — Telephone Encounter (Signed)
Per triage nurse Kathy Breach) moved inj from Saturday 3/15 to Monday 3/17 due to pt going out of town, lmonvm for pt re change w/new d/t/ pt to get schedule 2/28.

## 2012-07-15 NOTE — Telephone Encounter (Signed)
Request to change appointments to 08-01-2012 due to going out of town and will miss injection.  Asked if she will return to come in on Monday for injection.  She will return and can come in the morning of 08-05-2012 to receive injection.  Will notify schedulers.  Patient can be reached at  [phone].

## 2012-07-19 ENCOUNTER — Encounter: Payer: Self-pay | Admitting: Adult Health

## 2012-07-19 ENCOUNTER — Ambulatory Visit (HOSPITAL_BASED_OUTPATIENT_CLINIC_OR_DEPARTMENT_OTHER): Payer: BC Managed Care – PPO | Admitting: Adult Health

## 2012-07-19 ENCOUNTER — Telehealth: Payer: Self-pay | Admitting: Oncology

## 2012-07-19 ENCOUNTER — Ambulatory Visit: Payer: BC Managed Care – PPO

## 2012-07-19 ENCOUNTER — Other Ambulatory Visit (HOSPITAL_BASED_OUTPATIENT_CLINIC_OR_DEPARTMENT_OTHER): Payer: BC Managed Care – PPO | Admitting: Lab

## 2012-07-19 VITALS — BP 125/79 | HR 89 | Temp 98.1°F | Resp 20 | Ht 67.5 in | Wt 176.7 lb

## 2012-07-19 DIAGNOSIS — C50411 Malignant neoplasm of upper-outer quadrant of right female breast: Secondary | ICD-10-CM

## 2012-07-19 DIAGNOSIS — C50419 Malignant neoplasm of upper-outer quadrant of unspecified female breast: Secondary | ICD-10-CM

## 2012-07-19 DIAGNOSIS — Z171 Estrogen receptor negative status [ER-]: Secondary | ICD-10-CM

## 2012-07-19 LAB — CBC WITH DIFFERENTIAL/PLATELET
Eosinophils Absolute: 0 10*3/uL (ref 0.0–0.5)
HCT: 26 % — ABNORMAL LOW (ref 34.8–46.6)
LYMPH%: 21.7 % (ref 14.0–49.7)
MCHC: 31.9 g/dL (ref 31.5–36.0)
MCV: 97.7 fL (ref 79.5–101.0)
MONO%: 8.4 % (ref 0.0–14.0)
NEUT#: 3.3 10*3/uL (ref 1.5–6.5)
NEUT%: 69.1 % (ref 38.4–76.8)
Platelets: 60 10*3/uL — ABNORMAL LOW (ref 145–400)
RBC: 2.66 10*6/uL — ABNORMAL LOW (ref 3.70–5.45)
nRBC: 0 % (ref 0–0)

## 2012-07-19 NOTE — Patient Instructions (Addendum)
Doing well.  We will hold your chemotherapy this week due to your counts.  You will see Dr. Welton Flakes next week and discuss your chemotherapy regimen and changing the doses so your platelets dont continue to be as affected.  Please call us if you have any questions or concerns.

## 2012-07-19 NOTE — Progress Notes (Signed)
OFFICE PROGRESS NOTE  CC  Nicole Found, MD [address] [place] [address] Dr. Fatima Sanger Dr. 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 Diagnosed November 2013. Clinical stage II (T2 N1)  PRIOR THERAPY:  #1 patient originally presented to the multidisciplinary breast clinic on 04/10/2012. She originally palpated a right breast mass. She had a mammogram performed that showed dense breasts bilaterally. Ultrasound of the right breast showed 2.3 x 1.9 cm area of abnormality with multiple abnormal lymph nodes. MRI of the bilateral breasts showed asymmetrical enhancement throughout the right breast consistent with multicentric disease. An ultrasound-guided biopsy performed. The known area of disease measured 1.9 x 1.9 x 2.3 cm. Multiple abnormal positive right axillary lymph nodes were noted. 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 Biopsy of the right axillary lymph node was positive for malignancy with extracapsular extension. Patient was originally seen by Dr. Jamey Ripa Dr. Donnie Coffin and Dr. Michell Heinrich. She has elected to have a right mastectomy eventually and declined biopsies of any other areas within the breast.  #2 after extensive discussion patient initially was started on neoadjuvant chemotherapy by Dr. Donnie Coffin. He began her on neoadjuvant FEC given dose dense every 2 weeks with day 2 Neulasta. For 4 cycles.Followed by dose dense Taxotere every 2 weeks for a total of 4 cycles. She began her chemotherapy on 04/26/2012.  She will start Taxotere on 06/21/12.  She unfortunately had a bad hand-foot reaction and this was discontinued.  She will proceed with Gemzar/Carbo every 2 weeks.    CURRENT THERAPY: Gemzar/Carbo Cycle 2 day 1  INTERVAL HISTORY: Nicole Valentine 47 y.o.  female returns for Followup visit prior to cycle 2 of Gemzar/carbo.  She is more fatigued today, and hasn't really changed her activity or sleeping patterns.  Otherwise, she deneis shortness of breath, chest pain, dyspnea on exertion, fevers, chills, nausea, vomiting, constipation, diarrhea, pain, or any other concerns.  A 10 point ROS is otherwise neg.   MEDICAL HISTORY: Past Medical History  Diagnosis Date  . Breast cancer   . Thyroid disease   . Hypothyroidism   . Contact lens/glasses fitting     wears contacts or glasses  . Complication of anesthesia     her father and uncle both had very difficult time waking up-was  something they told them may be hereditory. she will find out.    ALLERGIES:  is allergic to almond meal.  MEDICATIONS:  Current Outpatient Prescriptions  Medication Sig Dispense Refill  . acyclovir (ZOVIRAX) 200 MG capsule       . Alum & Mag Hydroxide-Simeth (MAGIC MOUTHWASH) SOLN Take 5 mLs by mouth 3 (three) times daily.  100 mL  1  . Ascorbic Acid (VITAMIN C) 1000 MG tablet Take 1,000 mg by mouth daily.      . Calcium Carbonate-Vitamin D (CALCIUM + D PO) Take 1 capsule by mouth daily.      . Cholecalciferol (VITAMIN D-3 PO) Take 2,000 Int'l Units by mouth.      . Cyanocobalamin (VITAMIN B-12 PO) Take 5,000 mg by mouth daily.      . Ferrous Sulfate Dried (SLOW RELEASE IRON) 45 MG TBCR Take 65 mg by mouth daily.       [name] levothyroxine (SYNTHROID, LEVOTHROID) 200 MCG tablet 150 mcg.       [name]  LORazepam (ATIVAN) 0.5 MG tablet Take 1 tablet (0.5 mg total) by mouth every 6 (six) hours as needed for anxiety.  30 tablet  1  . dexamethasone (DECADRON) 4 MG tablet Take 2 tablets by mouth once a day on the day after chemotherapy and then take 2 tablets two times a day for 2 days. Take with food.  30 tablet  3  . lidocaine-prilocaine (EMLA) cream Apply topically as needed.  30 g  0   No current facility-administered medications for this visit.    SURGICAL HISTORY:  Past  Surgical History  Procedure Laterality Date  . Wisdom tooth extraction    . Portacath placement  04/22/2012    Procedure: INSERTION PORT-A-CATH;  Surgeon: Currie Paris, MD;  Location: [facility];  Service: General;  Laterality: Left;    REVIEW OF SYSTEMS:  General: fatigue (+), night sweats (-), fever (-), pain (-) Lymph: palpable nodes (-) HEENT: vision changes (-), mucositis (-), gum bleeding (-), epistaxis (-) Cardiovascular: chest pain (-), palpitations (-) Pulmonary: shortness of breath (-), dyspnea on exertion (-), cough (-), hemoptysis (-) GI:  Early satiety (-), melena (-), dysphagia (-), nausea/vomiting (-), diarrhea (-) GU: dysuria (-), hematuria (-), incontinence (-) Musculoskeletal: joint swelling (-), joint pain (-), back pain (-) Neuro: weakness (-), numbness (-), headache (-), confusion (-) Skin: Rash (-), lesions (-), dryness (-) Psych: depression (-), suicidal/homicidal ideation (-), feeling of hopelessness (-)    PHYSICAL EXAMINATION: Blood pressure 125/79, pulse 89, temperature 98.1 F (36.7 C), temperature source Oral, resp. rate 20, height 5' 7.5" (1.715 m), weight 176 lb 11.2 oz (80.151 kg). Body mass index is 27.25 kg/(m^2). General: Patient is a well appearing female in no acute distress HEENT: PERRLA, sclerae anicteric no conjunctival pallor, MMM Neck: supple, no palpable adenopathy Lungs: clear to auscultation bilaterally, no wheezes, rhonchi, or rales Cardiovascular: regular rate rhythm, S1, S2, no murmurs, rubs or gallops Abdomen: Soft, non-tender, non-distended, normoactive bowel sounds, no HSM Extremities: warm and well perfused, no clubbing, cyanosis, or edema Skin: No rashes or lesions Neuro: Non-focal ECOG PERFORMANCE STATUS: 0 - Asymptomatic Right breast still does reveal a palpable mass, much softer no axillary masses are noted left breast no masses or nipple discharge  LABORATORY DATA: Lab Results  Component Value Date    WBC 4.7 07/19/2012   HGB 8.3* 07/19/2012   HCT 26.0* 07/19/2012   MCV 97.7 07/19/2012   PLT 60* 07/19/2012  ANC1000    Chemistry      Component Value Date/Time   NA 140 07/12/2012 1049   K 3.4* 07/12/2012 1049   CL 101 07/12/2012 1049   CO2 32* 07/12/2012 1049   BUN 12.1 07/12/2012 1049   CREATININE 0.6 07/12/2012 1049      Component Value Date/Time   CALCIUM 8.8 07/12/2012 1049   ALKPHOS 123 07/12/2012 1049   AST 10 07/12/2012 1049   ALT 24 07/12/2012 1049   BILITOT 0.83 07/12/2012 1049       RADIOGRAPHIC STUDIES:  No results Valentine.  ASSESSMENT: 47 year old female with  #1 high risk stage IIB invasive ductal carcinomaOf the right breast. The tumor is triple negative with a Ki-67 of 25%. She is currently receiving neoadjuvant chemotherapy consisting of FEC being given dose dense.Thus far she is tolerating it well although she does become quite neutropenic.  #2 once patient completes her FEC for 4 cycles she will proceed with dose dense Taxotere for a total of 4 cycles.  Thsi was changed to  Gemzar Carbo after hand-foot reaction to taxotere.    #3 upon completion of her chemotherapy she will proceed with mastectomy and axillary lymph node dissection of the right breast and axilla. Post mastectomy patient will need radiation therapy likely since she did have multifocal disease. But we will leave that to the radiation oncologist   PLAN:  1. Nicole Valentine platelet counts are still low today, and her hemoglobin is lower as well which explains her fatigue.  Unfortunately we will hold her treatment, and see her back next week for chemo.    2. We will see her back next week for cycle 2 of Gemzar Carbo.   All questions were answered. The patient knows to call the clinic with any problems, questions or concerns. We can certainly see the patient much sooner if necessary.  I spent 25 minutes counseling the patient face to face. The total time spent in the appointment was 30 minutes.  Cherie Ouch  Lyn Hollingshead, NP Medical Oncology [facility] Phone: [phone] 07/19/2012, 9:14 AM

## 2012-07-19 NOTE — Telephone Encounter (Signed)
gv pt appt schedule for March and April and echo for 3/4.

## 2012-07-20 ENCOUNTER — Ambulatory Visit: Payer: BC Managed Care – PPO

## 2012-07-23 ENCOUNTER — Ambulatory Visit (HOSPITAL_COMMUNITY): Payer: BC Managed Care – PPO

## 2012-07-26 ENCOUNTER — Ambulatory Visit (HOSPITAL_BASED_OUTPATIENT_CLINIC_OR_DEPARTMENT_OTHER): Payer: BC Managed Care – PPO

## 2012-07-26 ENCOUNTER — Other Ambulatory Visit (HOSPITAL_BASED_OUTPATIENT_CLINIC_OR_DEPARTMENT_OTHER): Payer: BC Managed Care – PPO | Admitting: Lab

## 2012-07-26 ENCOUNTER — Encounter: Payer: Self-pay | Admitting: Oncology

## 2012-07-26 ENCOUNTER — Ambulatory Visit (HOSPITAL_BASED_OUTPATIENT_CLINIC_OR_DEPARTMENT_OTHER): Payer: BC Managed Care – PPO | Admitting: Oncology

## 2012-07-26 VITALS — BP 129/66 | HR 83 | Temp 98.3°F | Wt 176.7 lb

## 2012-07-26 DIAGNOSIS — Z171 Estrogen receptor negative status [ER-]: Secondary | ICD-10-CM

## 2012-07-26 DIAGNOSIS — C50419 Malignant neoplasm of upper-outer quadrant of unspecified female breast: Secondary | ICD-10-CM

## 2012-07-26 DIAGNOSIS — C773 Secondary and unspecified malignant neoplasm of axilla and upper limb lymph nodes: Secondary | ICD-10-CM

## 2012-07-26 DIAGNOSIS — Z5111 Encounter for antineoplastic chemotherapy: Secondary | ICD-10-CM

## 2012-07-26 DIAGNOSIS — D709 Neutropenia, unspecified: Secondary | ICD-10-CM

## 2012-07-26 DIAGNOSIS — C50911 Malignant neoplasm of unspecified site of right female breast: Secondary | ICD-10-CM

## 2012-07-26 DIAGNOSIS — C50919 Malignant neoplasm of unspecified site of unspecified female breast: Secondary | ICD-10-CM

## 2012-07-26 DIAGNOSIS — C50411 Malignant neoplasm of upper-outer quadrant of right female breast: Secondary | ICD-10-CM

## 2012-07-26 LAB — CBC WITH DIFFERENTIAL/PLATELET
Basophils Absolute: 0 10*3/uL (ref 0.0–0.1)
Eosinophils Absolute: 0 10*3/uL (ref 0.0–0.5)
HCT: 29.5 % — ABNORMAL LOW (ref 34.8–46.6)
HGB: 9.3 g/dL — ABNORMAL LOW (ref 11.6–15.9)
LYMPH%: 25.5 % (ref 14.0–49.7)
MCV: 102.8 fL — ABNORMAL HIGH (ref 79.5–101.0)
MONO%: 10.3 % (ref 0.0–14.0)
NEUT#: 2.4 10*3/uL (ref 1.5–6.5)
NEUT%: 63.6 % (ref 38.4–76.8)
Platelets: 269 10*3/uL (ref 145–400)
RBC: 2.87 10*6/uL — ABNORMAL LOW (ref 3.70–5.45)

## 2012-07-26 LAB — COMPREHENSIVE METABOLIC PANEL (CC13)
BUN: 8.7 mg/dL (ref 7.0–26.0)
CO2: 28 mEq/L (ref 22–29)
Calcium: 8.7 mg/dL (ref 8.4–10.4)
Chloride: 107 mEq/L (ref 98–107)
Creatinine: 0.6 mg/dL (ref 0.6–1.1)
Glucose: 103 mg/dl — ABNORMAL HIGH (ref 70–99)
Total Bilirubin: 0.46 mg/dL (ref 0.20–1.20)

## 2012-07-26 MED ORDER — SODIUM CHLORIDE 0.9 % IJ SOLN
10.0000 mL | INTRAMUSCULAR | Status: DC | PRN
  Administered 2012-07-26: 10 mL
  Filled 2012-07-26: qty 10

## 2012-07-26 MED ORDER — SODIUM CHLORIDE 0.9 % IV SOLN
726.5000 mg | Freq: Once | INTRAVENOUS | Status: AC
  Administered 2012-07-26: 730 mg via INTRAVENOUS
  Filled 2012-07-26: qty 73

## 2012-07-26 MED ORDER — DEXAMETHASONE SODIUM PHOSPHATE 10 MG/ML IJ SOLN
10.0000 mg | Freq: Once | INTRAMUSCULAR | Status: AC
  Administered 2012-07-26: 10 mg via INTRAVENOUS

## 2012-07-26 MED ORDER — DEXAMETHASONE 4 MG PO TABS: ORAL_TABLET | ORAL | Status: DC

## 2012-07-26 MED ORDER — SODIUM CHLORIDE 0.9 % IV SOLN
800.0000 mg/m2 | Freq: Once | INTRAVENOUS | Status: AC
  Administered 2012-07-26: 1520 mg via INTRAVENOUS
  Filled 2012-07-26: qty 40.02

## 2012-07-26 MED ORDER — SODIUM CHLORIDE 0.9 % IV SOLN
Freq: Once | INTRAVENOUS | Status: AC
  Administered 2012-07-26: 12:00:00 via INTRAVENOUS

## 2012-07-26 MED ORDER — HEPARIN SOD (PORK) LOCK FLUSH 100 UNIT/ML IV SOLN
500.0000 [IU] | Freq: Once | INTRAVENOUS | Status: AC | PRN
  Administered 2012-07-26: 500 [IU]
  Filled 2012-07-26: qty 5

## 2012-07-26 MED ORDER — ONDANSETRON 8 MG/50ML IVPB (CHCC)
8.0000 mg | Freq: Once | INTRAVENOUS | Status: AC
  Administered 2012-07-26: 8 mg via INTRAVENOUS

## 2012-07-26 NOTE — Patient Instructions (Addendum)
[facility] Discharge Instructions for Patients Receiving Chemotherapy  Today you received the following chemotherapy agents :  Gemzar & Carboplatin  To help prevent nausea and vomiting after your treatment, we encourage you to take your nausea medication  If you develop nausea and vomiting that is not controlled by your nausea medication, call the clinic. If it is after clinic hours your family physician or the after hours number for the clinic or go to the Emergency Department.   BELOW ARE SYMPTOMS THAT SHOULD BE REPORTED IMMEDIATELY:  *FEVER GREATER THAN 100.5 F  *CHILLS WITH OR WITHOUT FEVER  NAUSEA AND VOMITING THAT IS NOT CONTROLLED WITH YOUR NAUSEA MEDICATION  *UNUSUAL SHORTNESS OF BREATH  *UNUSUAL BRUISING OR BLEEDING  TENDERNESS IN MOUTH AND THROAT WITH OR WITHOUT PRESENCE OF ULCERS  *URINARY PROBLEMS  *BOWEL PROBLEMS  UNUSUAL RASH Items with * indicate a potential emergency and should be followed up as soon as possible.  Please let the nurse know about any problems that you may have experienced. Feel free to call the clinic you have any questions or concerns. The clinic phone number is [phone].   I have been informed and understand all the instructions given to me. I know to contact the clinic, my physician, or go to the Emergency Department if any problems should occur. I do not have any questions at this time, but understand that I may call the clinic during office hours   should I have any questions or need assistance in obtaining follow up care.    __________________________________________  _____________  __________ Signature of Patient or Authorized Representative            Date                   Time    __________________________________________ Nurse's Signature

## 2012-07-26 NOTE — Progress Notes (Signed)
OFFICE PROGRESS NOTE  CC  [name], MD [address] [place] [address] Dr. Fatima Sanger Dr. 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 Diagnosed November 2013. Clinical stage II (T2 N1)  PRIOR THERAPY:  #1 patient originally presented to the multidisciplinary breast clinic on 04/10/2012. She originally palpated a right breast mass. She had a mammogram performed that showed dense breasts bilaterally. Ultrasound of the right breast showed 2.3 x 1.9 cm area of abnormality with multiple abnormal lymph nodes. MRI of the bilateral breasts showed asymmetrical enhancement throughout the right breast consistent with multicentric disease. An ultrasound-guided biopsy performed. The known area of disease measured 1.9 x 1.9 x 2.3 cm. Multiple abnormal positive right axillary lymph nodes were noted. 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 Biopsy of the right axillary lymph node was positive for malignancy with extracapsular extension. Patient was originally seen by Dr. Jamey Ripa Dr. Donnie Coffin and Dr. Michell Heinrich. She has elected to have a right mastectomy eventually and declined biopsies of any other areas within the breast.  #2 after extensive discussion patient initially was started on neoadjuvant chemotherapy by Dr. Donnie Coffin. He began her on neoadjuvant FEC given dose dense every 2 weeks with day 2 Neulasta. For 4 cycles.Followed by dose dense Taxotere every 2 weeks for a total of 4 cycles. She began her chemotherapy on 04/26/2012.  She will start Taxotere on 06/21/12.  She unfortunately had a bad hand-foot reaction and this was discontinued.  She will proceed with Gemzar/Carbo every 2 weeks.    CURRENT THERAPY: Gemzar/Carbo Cycle 2 day 1  INTERVAL HISTORY: Dejia Ebron 47 y.o.  female returns for Followup visit prior to cycle 2 of Gemzar/carbo.  She is more fatigued today, and hasn't really changed her activity or sleeping patterns.  Otherwise, she deneis shortness of breath, chest pain, dyspnea on exertion, fevers, chills, nausea, vomiting, constipation, diarrhea, pain, or any other concerns.  A 10 point ROS is otherwise neg.   MEDICAL HISTORY: Past Medical History  Diagnosis Date  . Breast cancer   . Thyroid disease   . Hypothyroidism   . Contact lens/glasses fitting     wears contacts or glasses  . Complication of anesthesia     her father and uncle both had very difficult time waking up-was  something they told them may be hereditory. she will find out.    ALLERGIES:  is allergic to almond meal.  MEDICATIONS:  Current Outpatient Prescriptions  Medication Sig Dispense Refill  . Alum & Mag Hydroxide-Simeth (MAGIC MOUTHWASH) SOLN Take 5 mLs by mouth 3 (three) times daily.  100 mL  1  . Ascorbic Acid (VITAMIN C) 1000 MG tablet Take 1,000 mg by mouth daily.      . Cholecalciferol (VITAMIN D-3 PO) Take 2,000 Int'l Units by mouth.      . Cyanocobalamin (VITAMIN B-12 PO) Take 5,000 mg by mouth daily.      [name] dexamethasone (DECADRON) 4 MG tablet Take 2 tablets by mouth once a day on the day after chemotherapy and then take 2 tablets two times a day for 2 days. Take with food.  30 tablet  3  . Ferrous Sulfate Dried (SLOW RELEASE IRON) 45 MG TBCR Take 65 mg by mouth daily.       [name] levothyroxine (SYNTHROID, LEVOTHROID) 200  MCG tablet 150 mcg.       . lidocaine-prilocaine (EMLA) cream Apply topically as needed.  30 g  0  . LORazepam (ATIVAN) 0.5 MG tablet Take 1 tablet (0.5 mg total) by mouth every 6 (six) hours as needed for anxiety.  30 tablet  1  . acyclovir (ZOVIRAX) 200 MG capsule       . Calcium Carbonate-Vitamin D (CALCIUM + D PO) Take 1 capsule by mouth daily.       No current facility-administered medications for this visit.    SURGICAL HISTORY:  Past  Surgical History  Procedure Laterality Date  . Wisdom tooth extraction    . Portacath placement  04/22/2012    Procedure: INSERTION PORT-A-CATH;  Surgeon: Currie Paris, MD;  Location: [facility];  Service: General;  Laterality: Left;    REVIEW OF SYSTEMS:  General: fatigue (+), night sweats (-), fever (-), pain (-) Lymph: palpable nodes (-) HEENT: vision changes (-), mucositis (-), gum bleeding (-), epistaxis (-) Cardiovascular: chest pain (-), palpitations (-) Pulmonary: shortness of breath (-), dyspnea on exertion (-), cough (-), hemoptysis (-) GI:  Early satiety (-), melena (-), dysphagia (-), nausea/vomiting (-), diarrhea (-) GU: dysuria (-), hematuria (-), incontinence (-) Musculoskeletal: joint swelling (-), joint pain (-), back pain (-) Neuro: weakness (-), numbness (-), headache (-), confusion (-) Skin: Rash (-), lesions (-), dryness (-) Psych: depression (-), suicidal/homicidal ideation (-), feeling of hopelessness (-)    PHYSICAL EXAMINATION: Blood pressure 129/66, pulse 83, temperature 98.3 F (36.8 C), temperature source Oral, weight 176 lb 11.2 oz (80.151 kg). Body mass index is 27.25 kg/(m^2). General: Patient is a well appearing female in no acute distress HEENT: PERRLA, sclerae anicteric no conjunctival pallor, MMM Neck: supple, no palpable adenopathy Lungs: clear to auscultation bilaterally, no wheezes, rhonchi, or rales Cardiovascular: regular rate rhythm, S1, S2, no murmurs, rubs or gallops Abdomen: Soft, non-tender, non-distended, normoactive bowel sounds, no HSM Extremities: warm and well perfused, no clubbing, cyanosis, or edema Skin: No rashes or lesions Neuro: Non-focal ECOG PERFORMANCE STATUS: 0 - Asymptomatic Right breast still does reveal a palpable mass, much softer no axillary masses are noted left breast no masses or nipple discharge  LABORATORY DATA: Lab Results  Component Value Date   WBC 3.8* 07/26/2012   HGB 9.3* 07/26/2012    HCT 29.5* 07/26/2012   MCV 102.8* 07/26/2012   PLT 269 07/26/2012  ANC1000    Chemistry      Component Value Date/Time   NA 140 07/12/2012 1049   K 3.4* 07/12/2012 1049   CL 101 07/12/2012 1049   CO2 32* 07/12/2012 1049   BUN 12.1 07/12/2012 1049   CREATININE 0.6 07/12/2012 1049      Component Value Date/Time   CALCIUM 8.8 07/12/2012 1049   ALKPHOS 123 07/12/2012 1049   AST 10 07/12/2012 1049   ALT 24 07/12/2012 1049   BILITOT 0.83 07/12/2012 1049       RADIOGRAPHIC STUDIES:  No results found.  ASSESSMENT: 47 year old female with  #1 high risk stage IIB invasive ductal carcinomaOf the right breast. The tumor is triple negative with a Ki-67 of 25%. She is currently receiving neoadjuvant chemotherapy consisting of FEC being given dose dense.Thus far she is tolerating it well although she does become quite neutropenic.  #2 once patient completes her FEC for 4 cycles she will proceed with dose dense Taxotere for a total of 4 cycles.  Thsi was changed to Gemzar Carbo after hand-foot reaction to  taxotere.  Her Gemzar carboplatinum began on 07/05/2012. A total of 6 cycles of this combination is planned.  #3 upon completion of her chemotherapy she will proceed with mastectomy and axillary lymph node dissection of the right breast and axilla. Post mastectomy patient will need radiation therapy likely since she did have multifocal disease. But we will leave that to the radiation oncologist   PLAN:   1 patient will proceed with Gemzar carboplatinum today. She understands the risks and benefits.  #2 she will return in one week's time for interim lab in followup.  All questions were answered. The patient knows to call the clinic with any problems, questions or concerns. We can certainly see the patient much sooner if necessary.  I spent 25 minutes counseling the patient face to face. The total time spent in the appointment was 30 minutes. Drue Second, MD Medical/Oncology [facility] [phone] (beeper) [phone] (Office)   07/26/2012, 11:26 AM

## 2012-07-27 ENCOUNTER — Ambulatory Visit (HOSPITAL_BASED_OUTPATIENT_CLINIC_OR_DEPARTMENT_OTHER): Payer: BC Managed Care – PPO

## 2012-07-27 VITALS — BP 135/81 | HR 63 | Temp 98.6°F | Resp 20

## 2012-07-27 DIAGNOSIS — C50919 Malignant neoplasm of unspecified site of unspecified female breast: Secondary | ICD-10-CM

## 2012-07-27 DIAGNOSIS — C773 Secondary and unspecified malignant neoplasm of axilla and upper limb lymph nodes: Secondary | ICD-10-CM

## 2012-07-27 DIAGNOSIS — C50419 Malignant neoplasm of upper-outer quadrant of unspecified female breast: Secondary | ICD-10-CM

## 2012-07-27 DIAGNOSIS — Z5189 Encounter for other specified aftercare: Secondary | ICD-10-CM

## 2012-07-27 MED ORDER — PEGFILGRASTIM INJECTION 6 MG/0.6ML
6.0000 mg | Freq: Once | SUBCUTANEOUS | Status: AC
  Administered 2012-07-27: 6 mg via SUBCUTANEOUS

## 2012-07-29 ENCOUNTER — Other Ambulatory Visit: Payer: Self-pay | Admitting: Oncology

## 2012-07-29 NOTE — Progress Notes (Signed)
She called and requested a refill on Ativan. I called in Ativan, 0.5 mg, each bedtime when necessary, 10 tablets with no refills to [employer].

## 2012-07-30 ENCOUNTER — Other Ambulatory Visit: Payer: Self-pay | Admitting: *Deleted

## 2012-07-30 ENCOUNTER — Ambulatory Visit (HOSPITAL_COMMUNITY)
Admission: RE | Admit: 2012-07-30 | Discharge: 2012-07-30 | Disposition: A | Discharge status: Home / Self Care | Payer: BC Managed Care – PPO | Source: Ambulatory Visit | Attending: Adult Health | Admitting: Adult Health

## 2012-07-30 DIAGNOSIS — I059 Rheumatic mitral valve disease, unspecified: Secondary | ICD-10-CM | POA: Insufficient documentation

## 2012-07-30 DIAGNOSIS — C50919 Malignant neoplasm of unspecified site of unspecified female breast: Secondary | ICD-10-CM

## 2012-07-30 DIAGNOSIS — Z79899 Other long term (current) drug therapy: Secondary | ICD-10-CM

## 2012-07-30 DIAGNOSIS — E039 Hypothyroidism, unspecified: Secondary | ICD-10-CM | POA: Insufficient documentation

## 2012-07-30 DIAGNOSIS — C50419 Malignant neoplasm of upper-outer quadrant of unspecified female breast: Secondary | ICD-10-CM

## 2012-07-30 MED ORDER — LORAZEPAM 0.5 MG PO TABS: 0.5000 mg | ORAL_TABLET | Freq: Four times a day (QID) | ORAL | Status: DC | PRN

## 2012-07-30 NOTE — Progress Notes (Signed)
*  PRELIMINARY RESULTS* Echocardiogram 2D Echocardiogram has been performed.  Nicole Valentine, Nicole Valentine 07/30/2012, 11:02 AM 

## 2012-08-02 ENCOUNTER — Telehealth: Payer: Self-pay | Admitting: Oncology

## 2012-08-02 ENCOUNTER — Ambulatory Visit (HOSPITAL_BASED_OUTPATIENT_CLINIC_OR_DEPARTMENT_OTHER): Payer: BC Managed Care – PPO | Admitting: Adult Health

## 2012-08-02 ENCOUNTER — Ambulatory Visit: Payer: BC Managed Care – PPO

## 2012-08-02 ENCOUNTER — Encounter: Payer: Self-pay | Admitting: Adult Health

## 2012-08-02 ENCOUNTER — Other Ambulatory Visit (HOSPITAL_BASED_OUTPATIENT_CLINIC_OR_DEPARTMENT_OTHER): Payer: BC Managed Care – PPO | Admitting: Lab

## 2012-08-02 VITALS — BP 114/75 | HR 82 | Temp 98.1°F | Resp 20 | Ht 67.0 in | Wt 172.4 lb

## 2012-08-02 DIAGNOSIS — R5381 Other malaise: Secondary | ICD-10-CM

## 2012-08-02 DIAGNOSIS — Z171 Estrogen receptor negative status [ER-]: Secondary | ICD-10-CM

## 2012-08-02 DIAGNOSIS — C50919 Malignant neoplasm of unspecified site of unspecified female breast: Secondary | ICD-10-CM

## 2012-08-02 DIAGNOSIS — C773 Secondary and unspecified malignant neoplasm of axilla and upper limb lymph nodes: Secondary | ICD-10-CM

## 2012-08-02 DIAGNOSIS — C50411 Malignant neoplasm of upper-outer quadrant of right female breast: Secondary | ICD-10-CM

## 2012-08-02 DIAGNOSIS — C50419 Malignant neoplasm of upper-outer quadrant of unspecified female breast: Secondary | ICD-10-CM

## 2012-08-02 LAB — CBC WITH DIFFERENTIAL/PLATELET
Eosinophils Absolute: 0 10*3/uL (ref 0.0–0.5)
HCT: 28.6 % — ABNORMAL LOW (ref 34.8–46.6)
LYMPH%: 12.4 % — ABNORMAL LOW (ref 14.0–49.7)
MCV: 103.1 fL — ABNORMAL HIGH (ref 79.5–101.0)
MONO#: 0.4 10*3/uL (ref 0.1–0.9)
NEUT#: 6.1 10*3/uL (ref 1.5–6.5)
NEUT%: 82.1 % — ABNORMAL HIGH (ref 38.4–76.8)
Platelets: 118 10*3/uL — ABNORMAL LOW (ref 145–400)
WBC: 7.4 10*3/uL (ref 3.9–10.3)

## 2012-08-02 LAB — COMPREHENSIVE METABOLIC PANEL (CC13)
AST: 12 U/L (ref 5–34)
Albumin: 3.2 g/dL — ABNORMAL LOW (ref 3.5–5.0)
Alkaline Phosphatase: 147 U/L (ref 40–150)
Glucose: 64 mg/dl — ABNORMAL LOW (ref 70–99)
Potassium: 3.6 mEq/L (ref 3.5–5.1)
Sodium: 143 mEq/L (ref 136–145)
Total Bilirubin: 0.58 mg/dL (ref 0.20–1.20)
Total Protein: 6.1 g/dL — ABNORMAL LOW (ref 6.4–8.3)

## 2012-08-02 MED ORDER — LORAZEPAM 0.5 MG PO TABS: 0.5000 mg | ORAL_TABLET | Freq: Four times a day (QID) | ORAL | Status: DC | PRN

## 2012-08-02 NOTE — Telephone Encounter (Signed)
gv pt appt schedule for March thru May. No provider availability 4/18. Pt aware this can be addressed w/APP when she returns next wk. Message to [place] re d/t.

## 2012-08-02 NOTE — Progress Notes (Signed)
OFFICE PROGRESS NOTE  CC  [name], MD [address] [place] [address] Dr. Fatima Sanger Dr. 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 Diagnosed November 2013. Clinical stage II (T2 N1)  PRIOR THERAPY:  #1 patient originally presented to the multidisciplinary breast clinic on 04/10/2012. She originally palpated a right breast mass. She had a mammogram performed that showed dense breasts bilaterally. Ultrasound of the right breast showed 2.3 x 1.9 cm area of abnormality with multiple abnormal lymph nodes. MRI of the bilateral breasts showed asymmetrical enhancement throughout the right breast consistent with multicentric disease. An ultrasound-guided biopsy performed. The known area of disease measured 1.9 x 1.9 x 2.3 cm. Multiple abnormal positive right axillary lymph nodes were noted. 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 Biopsy of the right axillary lymph node was positive for malignancy with extracapsular extension. Patient was originally seen by Dr. Jamey Ripa Dr. Donnie Coffin and Dr. Michell Heinrich. She has elected to have a right mastectomy eventually and declined biopsies of any other areas within the breast.  #2 after extensive discussion patient initially was started on neoadjuvant chemotherapy by Dr. Donnie Coffin. He began her on neoadjuvant FEC given dose dense every 2 weeks with day 2 Neulasta. For 4 cycles.Followed by dose dense Taxotere every 2 weeks for a total of 4 cycles. She began her chemotherapy on 04/26/2012.  She will start Taxotere on 06/21/12.  She unfortunately had a bad hand-foot reaction and this was discontinued.  She will proceed with Gemzar/Carbo every 2 weeks.    CURRENT THERAPY: Gemzar/Carbo Cycle 2 day 8 INTERVAL HISTORY: Nicole Valentine 47 y.o. female  returns for Followup visit after cycle 2 of Gemzar/carbo.  She is doing well today.  She has some dry skin, and separation of her fingernails that started when she developed the hand foot reaction to the taxotere.  She is moisturizing her nails, and denies pain or redness on her fingers.  She also denies fevers, chills, nausea, vomiting, pain, or any other concerns.  She is fatigued.  Otherwise a 10 point ROS is neg.   MEDICAL HISTORY: Past Medical History  Diagnosis Date  . Breast cancer   . Thyroid disease   . Hypothyroidism   . Contact lens/glasses fitting     wears contacts or glasses  . Complication of anesthesia     her father and uncle both had very difficult time waking up-was  something they told them may be hereditory. she will find out.    ALLERGIES:  is allergic to almond meal.  MEDICATIONS:  Current Outpatient Prescriptions  Medication Sig Dispense Refill  . Alum & Mag Hydroxide-Simeth (MAGIC MOUTHWASH) SOLN Take 5 mLs by mouth 3 (three) times daily.  100 mL  1  . calcium carbonate (OS-CAL - DOSED IN MG OF ELEMENTAL CALCIUM) 1250 MG tablet Take 1 tablet by mouth daily.      . Cholecalciferol (VITAMIN D-3 PO) Take 2,000 Int'l Units by mouth.      . Cyanocobalamin (VITAMIN B-12 PO) Take 5,000 mg by mouth daily.      [name] dexamethasone (DECADRON) 4 MG tablet Take 2 tablets by mouth once a day on the day after chemotherapy and then take 2 tablets two times a day for 2 days. Take with food.  30 tablet  3  .  Ferrous Sulfate Dried (SLOW RELEASE IRON) 45 MG TBCR Take 65 mg by mouth daily.       [name] levothyroxine (SYNTHROID, LEVOTHROID) 200 MCG tablet 150 mcg.       . lidocaine-prilocaine (EMLA) cream Apply topically as needed.  30 g  0  . LORazepam (ATIVAN) 0.5 MG tablet Take 1 tablet (0.5 mg total) by mouth every 6 (six) hours as needed.  30 tablet  0  . acyclovir (ZOVIRAX) 200 MG capsule        No current facility-administered medications for this visit.    SURGICAL HISTORY:  Past  Surgical History  Procedure Laterality Date  . Wisdom tooth extraction    . Portacath placement  04/22/2012    Procedure: INSERTION PORT-A-CATH;  Surgeon: Currie Paris, MD;  Location: [facility];  Service: General;  Laterality: Left;    REVIEW OF SYSTEMS:  General: fatigue (+), night sweats (-), fever (-), pain (-) Lymph: palpable nodes (-) HEENT: vision changes (-), mucositis (-), gum bleeding (-), epistaxis (-) Cardiovascular: chest pain (-), palpitations (-) Pulmonary: shortness of breath (-), dyspnea on exertion (-), cough (-), hemoptysis (-) GI:  Early satiety (-), melena (-), dysphagia (-), nausea/vomiting (-), diarrhea (-) GU: dysuria (-), hematuria (-), incontinence (-) Musculoskeletal: joint swelling (-), joint pain (-), back pain (-) Neuro: weakness (-), numbness (-), headache (-), confusion (-) Skin: Rash (-), lesions (-), dryness (-) Psych: depression (-), suicidal/homicidal ideation (-), feeling of hopelessness (-)    PHYSICAL EXAMINATION: Blood pressure 114/75, pulse 82, temperature 98.1 F (36.7 C), temperature source Oral, resp. rate 20, height 5\' 7"  (1.702 m), weight 172 lb 6.4 oz (78.2 kg). Body mass index is 27 kg/(m^2). General: Patient is a well appearing female in no acute distress HEENT: PERRLA, sclerae anicteric no conjunctival pallor, MMM Neck: supple, no palpable adenopathy Lungs: clear to auscultation bilaterally, no wheezes, rhonchi, or rales Cardiovascular: regular rate rhythm, S1, S2, no murmurs, rubs or gallops Abdomen: Soft, non-tender, non-distended, normoactive bowel sounds, no HSM Extremities: warm and well perfused, no clubbing, cyanosis, or edema Skin: No rashes or lesions, fingernails are beginning to separate at the edges, no erythema, or exudate from cuticles Neuro: Non-focal ECOG PERFORMANCE STATUS: 0 - Asymptomatic Right breast still does reveal a palpable mass, much softer no axillary masses are noted left breast no  masses or nipple discharge  LABORATORY DATA: Lab Results  Component Value Date   WBC 7.4 08/02/2012   HGB 9.9* 08/02/2012   HCT 28.6* 08/02/2012   MCV 103.1* 08/02/2012   PLT 118* 08/02/2012  ANC1000    Chemistry      Component Value Date/Time   NA 144 07/26/2012 1028   K 3.6 07/26/2012 1028   CL 107 07/26/2012 1028   CO2 28 07/26/2012 1028   BUN 8.7 07/26/2012 1028   CREATININE 0.6 07/26/2012 1028      Component Value Date/Time   CALCIUM 8.7 07/26/2012 1028   ALKPHOS 116 07/26/2012 1028   AST 39* 07/26/2012 1028   ALT 69* 07/26/2012 1028   BILITOT 0.46 07/26/2012 1028       RADIOGRAPHIC STUDIES:  No results found.  ASSESSMENT: 47 year old female with  #1 high risk stage IIB invasive ductal carcinomaOf the right breast. The tumor is triple negative with a Ki-67 of 25%. She is currently receiving neoadjuvant chemotherapy consisting of FEC being given dose dense.Thus far she is tolerating it well although she does become quite neutropenic.  #2 once patient completes her FEC  for 4 cycles she will proceed with dose dense Taxotere for a total of 4 cycles.  Thsi was changed to Gemzar Carbo after hand-foot reaction to taxotere.    #3 upon completion of her chemotherapy she will proceed with mastectomy and axillary lymph node dissection of the right breast and axilla. Post mastectomy patient will need radiation therapy likely since she did have multifocal disease. But we will leave that to the radiation oncologist   PLAN:   1. Nicole Valentine is doing well today.  Labs look good. I refilled her Ativan.   2. We will see her back next week for cycle 3 of Gemzar Carbo.   All questions were answered. The patient knows to call the clinic with any problems, questions or concerns. We can certainly see the patient much sooner if necessary.  I spent 25 minutes counseling the patient face to face. The total time spent in the appointment was 30 minutes.  Cherie Ouch Lyn Hollingshead, NP Medical Oncology [facility] Phone: [phone] 08/02/2012, 9:16 AM

## 2012-08-02 NOTE — Patient Instructions (Addendum)
Doing well, labs look good.  We will see you next week for evaluation for chemotherapy.

## 2012-08-02 NOTE — Telephone Encounter (Signed)
Per LA scheduled 4/18 f/u w/her for 10:15am. Pt was already made aware to get new schedule when she comes back next wk.

## 2012-08-03 ENCOUNTER — Ambulatory Visit: Payer: BC Managed Care – PPO

## 2012-08-05 ENCOUNTER — Ambulatory Visit: Payer: BC Managed Care – PPO

## 2012-08-06 ENCOUNTER — Telehealth: Payer: Self-pay | Admitting: Medical Oncology

## 2012-08-06 ENCOUNTER — Ambulatory Visit (HOSPITAL_BASED_OUTPATIENT_CLINIC_OR_DEPARTMENT_OTHER): Payer: BC Managed Care – PPO | Admitting: Adult Health

## 2012-08-06 ENCOUNTER — Encounter: Payer: Self-pay | Admitting: Adult Health

## 2012-08-06 VITALS — BP 115/28 | HR 83 | Temp 98.3°F | Resp 20 | Ht 67.0 in | Wt 174.9 lb

## 2012-08-06 DIAGNOSIS — I889 Nonspecific lymphadenitis, unspecified: Secondary | ICD-10-CM

## 2012-08-06 DIAGNOSIS — C50419 Malignant neoplasm of upper-outer quadrant of unspecified female breast: Secondary | ICD-10-CM

## 2012-08-06 DIAGNOSIS — C50411 Malignant neoplasm of upper-outer quadrant of right female breast: Secondary | ICD-10-CM

## 2012-08-06 DIAGNOSIS — D709 Neutropenia, unspecified: Secondary | ICD-10-CM

## 2012-08-06 MED ORDER — AZITHROMYCIN 250 MG PO TABS: ORAL_TABLET | ORAL | Status: DC

## 2012-08-06 NOTE — Patient Instructions (Addendum)
I have prescribed a zpak for your lymph node.  We will see you back on Friday for evaluation.

## 2012-08-06 NOTE — Progress Notes (Signed)
OFFICE PROGRESS NOTE  CC  [name], MD [address] [place] [address] Dr. Fatima Sanger Dr. 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 Diagnosed November 2013. Clinical stage II (T2 N1)  PRIOR THERAPY:  #1 patient originally presented to the multidisciplinary breast clinic on 04/10/2012. She originally palpated a right breast mass. She had a mammogram performed that showed dense breasts bilaterally. Ultrasound of the right breast showed 2.3 x 1.9 cm area of abnormality with multiple abnormal lymph nodes. MRI of the bilateral breasts showed asymmetrical enhancement throughout the right breast consistent with multicentric disease. An ultrasound-guided biopsy performed. The known area of disease measured 1.9 x 1.9 x 2.3 cm. Multiple abnormal positive right axillary lymph nodes were noted. 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 Biopsy of the right axillary lymph node was positive for malignancy with extracapsular extension. Patient was originally seen by Dr. Jamey Ripa Dr. Donnie Coffin and Dr. Michell Heinrich. She has elected to have a right mastectomy eventually and declined biopsies of any other areas within the breast.  #2 after extensive discussion patient initially was started on neoadjuvant chemotherapy by Dr. Donnie Coffin. He began her on neoadjuvant FEC given dose dense every 2 weeks with day 2 Neulasta. For 4 cycles.Followed by dose dense Taxotere every 2 weeks for a total of 4 cycles. She began her chemotherapy on 04/26/2012.  She will start Taxotere on 06/21/12.  She unfortunately had a bad hand-foot reaction and this was discontinued.  She will proceed with Gemzar/Carbo every 2 weeks.    CURRENT THERAPY: Gemzar/Carbo Cycle 2 day 12  INTERVAL HISTORY: Nicole Valentine 47 y.o.  female returns for an interim visit due to a lump and sore spot under her right axilla.  This started Sunday evening, 3/16, and has remained the same.  Its constant and with no alleviating or aggravating factors.  She deneis fevers, chills, or any other concerns.     MEDICAL HISTORY: Past Medical History  Diagnosis Date  . Breast cancer   . Thyroid disease   . Hypothyroidism   . Contact lens/glasses fitting     wears contacts or glasses  . Complication of anesthesia     her father and uncle both had very difficult time waking up-was  something they told them may be hereditory. she will find out.    ALLERGIES:  is allergic to almond meal.  MEDICATIONS:  Current Outpatient Prescriptions  Medication Sig Dispense Refill  . acyclovir (ZOVIRAX) 200 MG capsule       . Alum & Mag Hydroxide-Simeth (MAGIC MOUTHWASH) SOLN Take 5 mLs by mouth 3 (three) times daily.  100 mL  1  . calcium carbonate (OS-CAL - DOSED IN MG OF ELEMENTAL CALCIUM) 1250 MG tablet Take 1 tablet by mouth daily.      . Cholecalciferol (VITAMIN D-3 PO) Take 2,000 Int'l Units by mouth.      . Cyanocobalamin (VITAMIN B-12 PO) Take 5,000 mg by mouth daily.      [name] dexamethasone (DECADRON) 4 MG tablet Take 2 tablets by mouth once a day on the day after chemotherapy and then take 2 tablets two times a day for 2 days. Take with food.  30 tablet  3  . Ferrous Sulfate Dried (SLOW RELEASE IRON) 45 MG TBCR Take 65 mg by mouth daily.       [name]  levothyroxine (SYNTHROID, LEVOTHROID) 200 MCG tablet 150 mcg.       . lidocaine-prilocaine (EMLA) cream Apply topically as needed.  30 g  0  . LORazepam (ATIVAN) 0.5 MG tablet Take 1 tablet (0.5 mg total) by mouth every 6 (six) hours as needed.  30 tablet  0   No current facility-administered medications for this visit.    SURGICAL HISTORY:  Past Surgical History  Procedure Laterality Date  . Wisdom tooth extraction    . Portacath placement  04/22/2012    Procedure: INSERTION PORT-A-CATH;   Surgeon: Currie Paris, MD;  Location: [facility];  Service: General;  Laterality: Left;    REVIEW OF SYSTEMS:  General: fatigue (+), night sweats (-), fever (-), pain (-) Lymph: palpable nodes (-) HEENT: vision changes (-), mucositis (-), gum bleeding (-), epistaxis (-) Cardiovascular: chest pain (-), palpitations (-) Pulmonary: shortness of breath (-), dyspnea on exertion (-), cough (-), hemoptysis (-) GI:  Early satiety (-), melena (-), dysphagia (-), nausea/vomiting (-), diarrhea (-) GU: dysuria (-), hematuria (-), incontinence (-) Musculoskeletal: joint swelling (-), joint pain (-), back pain (-) Neuro: weakness (-), numbness (-), headache (-), confusion (-) Skin: Rash (-), lesions (-), dryness (-) Psych: depression (-), suicidal/homicidal ideation (-), feeling of hopelessness (-)    PHYSICAL EXAMINATION: Blood pressure 115/28, pulse 83, temperature 98.3 F (36.8 C), temperature source Oral, resp. rate 20, height 5\' 7"  (1.702 m), weight 174 lb 14.4 oz (79.334 kg). Body mass index is 27.39 kg/(m^2). General: Patient is a well appearing female in no acute distress HEENT: PERRLA, sclerae anicteric no conjunctival pallor, MMM Neck: supple, no palpable adenopathy Lungs: clear to auscultation bilaterally, no wheezes, rhonchi, or rales Cardiovascular: regular rate rhythm, S1, S2, no murmurs, rubs or gallops Abdomen: Soft, non-tender, non-distended, normoactive bowel sounds, no HSM Extremities: warm and well perfused, no clubbing, cyanosis, or edema Skin: No rashes or lesions, fingernails are beginning to separate at the edges, no erythema, or exudate from cuticles Neuro: Non-focal ECOG PERFORMANCE STATUS: 0 - Asymptomatic Right breast still does reveal a palpable mass, much softer no axillary masses are noted left breast no masses or nipple discharge, right axilla with 1cm tender enlarged node, no redness or warmth  LABORATORY DATA: Lab Results  Component Value Date    WBC 7.4 08/02/2012   HGB 9.9* 08/02/2012   HCT 28.6* 08/02/2012   MCV 103.1* 08/02/2012   PLT 118* 08/02/2012      Chemistry      Component Value Date/Time   NA 143 08/02/2012 0842   K 3.6 08/02/2012 0842   CL 102 08/02/2012 0842   CO2 35* 08/02/2012 0842   BUN 14.4 08/02/2012 0842   CREATININE 0.6 08/02/2012 0842      Component Value Date/Time   CALCIUM 9.2 08/02/2012 0842   ALKPHOS 147 08/02/2012 0842   AST 12 08/02/2012 0842   ALT 33 08/02/2012 0842   BILITOT 0.58 08/02/2012 0842       RADIOGRAPHIC STUDIES:  No results found.  ASSESSMENT: 47 year old female with  #1 high risk stage IIB invasive ductal carcinomaOf the right breast. The tumor is triple negative with a Ki-67 of 25%. She is currently receiving neoadjuvant chemotherapy consisting of FEC being given dose dense.Thus far she is tolerating it well although she does become quite neutropenic.  #2 once patient completes her FEC for 4 cycles she will proceed with dose dense Taxotere for a total of 4 cycles.  Thsi was changed to Gemzar Carbo  after hand-foot reaction to taxotere.    #3 upon completion of her chemotherapy she will proceed with mastectomy and axillary lymph node dissection of the right breast and axilla. Post mastectomy patient will need radiation therapy likely since she did have multifocal disease. But we will leave that to the radiation oncologist   PLAN:   1. Ms. Campo is doing well today. I prescribed a Zpak for her lymphadenitis.   2. We will see her back Friday for cycle 3 of Gemzar Carbo.   All questions were answered. The patient knows to call the clinic with any problems, questions or concerns. We can certainly see the patient much sooner if necessary.  I spent 25 minutes counseling the patient face to face. The total time spent in the appointment was 30 minutes.  Cherie Ouch Lyn Hollingshead, NP Medical Oncology [facility] Phone: [phone] 08/06/2012, 12:40 PM

## 2012-08-06 NOTE — Telephone Encounter (Signed)
Pt LVMOM stating the "lymph node in my right arm is sore and about the size of a quarter or larger, it's extremely tender and sore to touch and when I move my arm around. I'm inquiring as to what to do about it?" Reviewed pt concerns with MD and NP. Returned call to patient and she is sched to see NP at 12:15 today. Scheduling informed. Patient with no further questions at this time.

## 2012-08-09 ENCOUNTER — Telehealth: Payer: Self-pay | Admitting: *Deleted

## 2012-08-09 ENCOUNTER — Ambulatory Visit: Payer: BC Managed Care – PPO

## 2012-08-09 ENCOUNTER — Ambulatory Visit (HOSPITAL_BASED_OUTPATIENT_CLINIC_OR_DEPARTMENT_OTHER): Payer: BC Managed Care – PPO | Admitting: Adult Health

## 2012-08-09 ENCOUNTER — Encounter: Payer: Self-pay | Admitting: Adult Health

## 2012-08-09 ENCOUNTER — Other Ambulatory Visit: Payer: Self-pay | Admitting: Oncology

## 2012-08-09 ENCOUNTER — Other Ambulatory Visit (HOSPITAL_BASED_OUTPATIENT_CLINIC_OR_DEPARTMENT_OTHER): Payer: BC Managed Care – PPO | Admitting: Lab

## 2012-08-09 VITALS — BP 116/76 | HR 89 | Temp 98.3°F | Resp 20 | Ht 67.0 in | Wt 175.0 lb

## 2012-08-09 DIAGNOSIS — C50411 Malignant neoplasm of upper-outer quadrant of right female breast: Secondary | ICD-10-CM

## 2012-08-09 DIAGNOSIS — D696 Thrombocytopenia, unspecified: Secondary | ICD-10-CM

## 2012-08-09 DIAGNOSIS — C50419 Malignant neoplasm of upper-outer quadrant of unspecified female breast: Secondary | ICD-10-CM

## 2012-08-09 DIAGNOSIS — I889 Nonspecific lymphadenitis, unspecified: Secondary | ICD-10-CM

## 2012-08-09 LAB — CBC WITH DIFFERENTIAL/PLATELET
BASO%: 0.4 % (ref 0.0–2.0)
HGB: 9.7 g/dL — ABNORMAL LOW (ref 11.6–15.9)
MCH: 34.5 pg — ABNORMAL HIGH (ref 25.1–34.0)
MCV: 105 fL — ABNORMAL HIGH (ref 79.5–101.0)
NEUT#: 3.9 10*3/uL (ref 1.5–6.5)
NEUT%: 69.4 % (ref 38.4–76.8)
Platelets: 73 10*3/uL — ABNORMAL LOW (ref 145–400)
RBC: 2.81 10*6/uL — ABNORMAL LOW (ref 3.70–5.45)
nRBC: 0 % (ref 0–0)

## 2012-08-09 MED ORDER — AZITHROMYCIN 250 MG PO TABS: ORAL_TABLET | ORAL | Status: DC

## 2012-08-09 NOTE — Progress Notes (Signed)
OFFICE PROGRESS NOTE  CC  [name], MD [address] [place] [address] Dr. Fatima Sanger Dr. 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 Diagnosed November 2013. Clinical stage II (T2 N1)  PRIOR THERAPY:  #1 patient originally presented to the multidisciplinary breast clinic on 04/10/2012. She originally palpated a right breast mass. She had a mammogram performed that showed dense breasts bilaterally. Ultrasound of the right breast showed 2.3 x 1.9 cm area of abnormality with multiple abnormal lymph nodes. MRI of the bilateral breasts showed asymmetrical enhancement throughout the right breast consistent with multicentric disease. An ultrasound-guided biopsy performed. The known area of disease measured 1.9 x 1.9 x 2.3 cm. Multiple abnormal positive right axillary lymph nodes were noted. 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 Biopsy of the right axillary lymph node was positive for malignancy with extracapsular extension. Patient was originally seen by Dr. Jamey Ripa Dr. Donnie Coffin and Dr. Michell Heinrich. She has elected to have a right mastectomy eventually and declined biopsies of any other areas within the breast.  #2 after extensive discussion patient initially was started on neoadjuvant chemotherapy by Dr. Donnie Coffin. He began her on neoadjuvant FEC given dose dense every 2 weeks with day 2 Neulasta. For 4 cycles.Followed by dose dense Taxotere every 2 weeks for a total of 4 cycles. She began her chemotherapy on 04/26/2012.  She will start Taxotere on 06/21/12.  She unfortunately had a bad hand-foot reaction and this was discontinued.  She will proceed with Gemzar/Carbo every 2 weeks.    CURRENT THERAPY: Gemzar/Carbo Cycle 3 day 1  INTERVAL HISTORY: Nicole Valentine 47 y.o.  female returns for evaluation for her scheduled chemotherapy.  Her swollen tender lymph node is not tender anymore, and it is smaller, however she is very upset that it is still there.  She has completed 3 days of antibiotics.  She denies fevers, chills, nausea, vomiting.  She is thrombocytopenic today and cannot receive chemotherapy.  She is very tearful and upset states that she feels put off by the cancer center.    MEDICAL HISTORY: Past Medical History  Diagnosis Date  . Breast cancer   . Thyroid disease   . Hypothyroidism   . Contact lens/glasses fitting     wears contacts or glasses  . Complication of anesthesia     her father and uncle both had very difficult time waking up-was  something they told them may be hereditory. she will find out.    ALLERGIES:  is allergic to almond meal.  MEDICATIONS:  Current Outpatient Prescriptions  Medication Sig Dispense Refill  . Alum & Mag Hydroxide-Simeth (MAGIC MOUTHWASH) SOLN Take 5 mLs by mouth 3 (three) times daily.  100 mL  1  . azithromycin (ZITHROMAX Z-PAK) 250 MG tablet Take 2 tabs po x 1 on day one, then one tab po daily until complete  6 each  0  . calcium carbonate (OS-CAL - DOSED IN MG OF ELEMENTAL CALCIUM) 1250 MG tablet Take 1 tablet by mouth daily.      . Cholecalciferol (VITAMIN D-3 PO) Take 2,000 Int'l Units by mouth.      . Cyanocobalamin (VITAMIN B-12 PO) Take 5,000 mg by mouth daily.      [name] dexamethasone (DECADRON) 4 MG tablet Take 2 tablets by mouth once a day on  the day after chemotherapy and then take 2 tablets two times a day for 2 days. Take with food.  30 tablet  3  . Ferrous Sulfate Dried (SLOW RELEASE IRON) 45 MG TBCR Take 65 mg by mouth daily.       [name] levothyroxine (SYNTHROID, LEVOTHROID) 200 MCG tablet 150 mcg.       . lidocaine-prilocaine (EMLA) cream Apply topically as needed.  30 g  0  . LORazepam (ATIVAN) 0.5 MG tablet Take 1 tablet (0.5 mg total) by mouth every 6 (six) hours as needed.  30 tablet  0  .  acyclovir (ZOVIRAX) 200 MG capsule       . azithromycin (ZITHROMAX) 250 MG tablet 1 tab daily to complete 10 days of antibiotics  5 each  0   No current facility-administered medications for this visit.    SURGICAL HISTORY:  Past Surgical History  Procedure Laterality Date  . Wisdom tooth extraction    . Portacath placement  04/22/2012    Procedure: INSERTION PORT-A-CATH;  Surgeon: Currie Paris, MD;  Location: [facility];  Service: General;  Laterality: Left;    REVIEW OF SYSTEMS:  General: fatigue (+), night sweats (-), fever (-), pain (-) Lymph: palpable nodes (-) HEENT: vision changes (-), mucositis (-), gum bleeding (-), epistaxis (-) Cardiovascular: chest pain (-), palpitations (-) Pulmonary: shortness of breath (-), dyspnea on exertion (-), cough (-), hemoptysis (-) GI:  Early satiety (-), melena (-), dysphagia (-), nausea/vomiting (-), diarrhea (-) GU: dysuria (-), hematuria (-), incontinence (-) Musculoskeletal: joint swelling (-), joint pain (-), back pain (-) Neuro: weakness (-), numbness (-), headache (-), confusion (-) Skin: Rash (-), lesions (-), dryness (-) Psych: depression (-), suicidal/homicidal ideation (-), feeling of hopelessness (-)    PHYSICAL EXAMINATION: Blood pressure 116/76, pulse 89, temperature 98.3 F (36.8 C), temperature source Oral, resp. rate 20, height 5\' 7"  (1.702 m), weight 175 lb (79.379 kg). Body mass index is 27.4 kg/(m^2). General: Patient is a well appearing female in no acute distress HEENT: PERRLA, sclerae anicteric no conjunctival pallor, MMM Neck: supple, no palpable adenopathy Lungs: clear to auscultation bilaterally, no wheezes, rhonchi, or rales Cardiovascular: regular rate rhythm, S1, S2, no murmurs, rubs or gallops Abdomen: Soft, non-tender, non-distended, normoactive bowel sounds, no HSM Extremities: warm and well perfused, no clubbing, cyanosis, or edema Skin: No rashes or lesions, fingernails are beginning  to separate at the edges, no erythema, or exudate from cuticles Neuro: Non-focal ECOG PERFORMANCE STATUS: 0 - Asymptomatic Right breast still does reveal a palpable mass, much softer no axillary masses are noted left breast no masses or nipple discharge, right axilla with 1cm non-tender enlarged node (slightly smaller), no redness or warmth  LABORATORY DATA: Lab Results  Component Value Date   WBC 5.6 08/09/2012   HGB 9.7* 08/09/2012   HCT 29.5* 08/09/2012   MCV 105.0* 08/09/2012   PLT 73* 08/09/2012      Chemistry      Component Value Date/Time   NA 143 08/02/2012 0842   K 3.6 08/02/2012 0842   CL 102 08/02/2012 0842   CO2 35* 08/02/2012 0842   BUN 14.4 08/02/2012 0842   CREATININE 0.6 08/02/2012 0842      Component Value Date/Time   CALCIUM 9.2 08/02/2012 0842   ALKPHOS 147 08/02/2012 0842   AST 12 08/02/2012 0842   ALT 33 08/02/2012 0842   BILITOT 0.58 08/02/2012 0842       RADIOGRAPHIC STUDIES:  No results found.  ASSESSMENT: 47 year old female with  #1 high risk stage IIB invasive ductal carcinomaOf the right breast. The tumor is triple negative with a Ki-67 of 25%. She is currently receiving neoadjuvant chemotherapy consisting of FEC being given dose dense.Thus far she is tolerating it well although she does become quite neutropenic.  #2 once patient completes her FEC for 4 cycles she will proceed with dose dense Taxotere for a total of 4 cycles.  Thsi was changed to Gemzar Carbo after hand-foot reaction to taxotere.    #3 upon completion of her chemotherapy she will proceed with mastectomy and axillary lymph node dissection of the right breast and axilla. Post mastectomy patient will need radiation therapy likely since she did have multifocal disease. But we will leave that to the radiation oncologist   PLAN:   1. Ms. Sprowl is very upset today.  Reviewed case with Dr. Darrold Span and she came in and spoke with patient.  We will continue Azithromycin for lymphadenitis.  She will  return on Monday for labs, and an appt to talk to Dr. Welton Flakes.    2. We will hold her chemotherapy today.    All questions were answered. The patient knows to call the clinic with any problems, questions or concerns. We can certainly see the patient much sooner if necessary.  I spent 25 minutes counseling the patient face to face. The total time spent in the appointment was 30 minutes.  Cherie Ouch Lyn Hollingshead, NP Medical Oncology [facility] Phone: [phone] 08/09/2012, 1:38 PM

## 2012-08-09 NOTE — Progress Notes (Signed)
Treatment cancelled per Mardella Layman, NP.

## 2012-08-09 NOTE — Telephone Encounter (Signed)
appts made and printed 

## 2012-08-10 ENCOUNTER — Ambulatory Visit: Payer: BC Managed Care – PPO

## 2012-08-12 ENCOUNTER — Other Ambulatory Visit: Payer: Self-pay | Admitting: Medical Oncology

## 2012-08-12 ENCOUNTER — Other Ambulatory Visit (HOSPITAL_BASED_OUTPATIENT_CLINIC_OR_DEPARTMENT_OTHER): Payer: BC Managed Care – PPO | Admitting: Lab

## 2012-08-12 ENCOUNTER — Ambulatory Visit (HOSPITAL_BASED_OUTPATIENT_CLINIC_OR_DEPARTMENT_OTHER): Payer: BC Managed Care – PPO | Admitting: Oncology

## 2012-08-12 ENCOUNTER — Encounter: Payer: Self-pay | Admitting: Adult Health

## 2012-08-12 ENCOUNTER — Ambulatory Visit (HOSPITAL_BASED_OUTPATIENT_CLINIC_OR_DEPARTMENT_OTHER): Payer: BC Managed Care – PPO

## 2012-08-12 VITALS — BP 121/76 | HR 81 | Temp 97.5°F | Resp 20 | Ht 67.0 in | Wt 178.8 lb

## 2012-08-12 DIAGNOSIS — C50419 Malignant neoplasm of upper-outer quadrant of unspecified female breast: Secondary | ICD-10-CM

## 2012-08-12 DIAGNOSIS — I889 Nonspecific lymphadenitis, unspecified: Secondary | ICD-10-CM

## 2012-08-12 DIAGNOSIS — Z171 Estrogen receptor negative status [ER-]: Secondary | ICD-10-CM

## 2012-08-12 DIAGNOSIS — C773 Secondary and unspecified malignant neoplasm of axilla and upper limb lymph nodes: Secondary | ICD-10-CM

## 2012-08-12 DIAGNOSIS — C50919 Malignant neoplasm of unspecified site of unspecified female breast: Secondary | ICD-10-CM

## 2012-08-12 DIAGNOSIS — R599 Enlarged lymph nodes, unspecified: Secondary | ICD-10-CM

## 2012-08-12 DIAGNOSIS — C50411 Malignant neoplasm of upper-outer quadrant of right female breast: Secondary | ICD-10-CM

## 2012-08-12 DIAGNOSIS — Z5111 Encounter for antineoplastic chemotherapy: Secondary | ICD-10-CM

## 2012-08-12 LAB — CBC WITH DIFFERENTIAL/PLATELET
Basophils Absolute: 0 10*3/uL (ref 0.0–0.1)
Eosinophils Absolute: 0 10*3/uL (ref 0.0–0.5)
HGB: 10 g/dL — ABNORMAL LOW (ref 11.6–15.9)
MONO#: 0.4 10*3/uL (ref 0.1–0.9)
NEUT#: 3 10*3/uL (ref 1.5–6.5)
RDW: 22.9 % — ABNORMAL HIGH (ref 11.2–14.5)
WBC: 4.2 10*3/uL (ref 3.9–10.3)
lymph#: 0.8 10*3/uL — ABNORMAL LOW (ref 0.9–3.3)

## 2012-08-12 LAB — COMPREHENSIVE METABOLIC PANEL (CC13)
Albumin: 3.1 g/dL — ABNORMAL LOW (ref 3.5–5.0)
BUN: 10.1 mg/dL (ref 7.0–26.0)
Calcium: 9.3 mg/dL (ref 8.4–10.4)
Chloride: 106 mEq/L (ref 98–107)
Glucose: 79 mg/dl (ref 70–99)
Potassium: 3.7 mEq/L (ref 3.5–5.1)

## 2012-08-12 MED ORDER — SODIUM CHLORIDE 0.9 % IV SOLN
Freq: Once | INTRAVENOUS | Status: AC
  Administered 2012-08-12: 11:00:00 via INTRAVENOUS

## 2012-08-12 MED ORDER — SODIUM CHLORIDE 0.9 % IV SOLN
750.0000 mg | Freq: Once | INTRAVENOUS | Status: AC
  Administered 2012-08-12: 750 mg via INTRAVENOUS
  Filled 2012-08-12: qty 75

## 2012-08-12 MED ORDER — ONDANSETRON 8 MG/50ML IVPB (CHCC)
8.0000 mg | Freq: Once | INTRAVENOUS | Status: AC
  Administered 2012-08-12: 8 mg via INTRAVENOUS

## 2012-08-12 MED ORDER — HEPARIN SOD (PORK) LOCK FLUSH 100 UNIT/ML IV SOLN
500.0000 [IU] | Freq: Once | INTRAVENOUS | Status: AC | PRN
  Administered 2012-08-12: 500 [IU]
  Filled 2012-08-12: qty 5

## 2012-08-12 MED ORDER — SODIUM CHLORIDE 0.9 % IJ SOLN
10.0000 mL | INTRAMUSCULAR | Status: DC | PRN
  Administered 2012-08-12: 10 mL
  Filled 2012-08-12: qty 10

## 2012-08-12 MED ORDER — SODIUM CHLORIDE 0.9 % IV SOLN
800.0000 mg/m2 | Freq: Once | INTRAVENOUS | Status: AC
  Administered 2012-08-12: 1520 mg via INTRAVENOUS
  Filled 2012-08-12: qty 40

## 2012-08-12 MED ORDER — DEXAMETHASONE SODIUM PHOSPHATE 10 MG/ML IJ SOLN
10.0000 mg | Freq: Once | INTRAMUSCULAR | Status: AC
  Administered 2012-08-12: 10 mg via INTRAVENOUS

## 2012-08-12 NOTE — Progress Notes (Signed)
OFFICE PROGRESS NOTE  CC  Nicole Found, MD [address] [place] [address] Dr. Fatima Sanger Dr. 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 Diagnosed November 2013. Clinical stage II (T2 N1)  PRIOR THERAPY:  #1 patient originally presented to the multidisciplinary breast clinic on 04/10/2012. She originally palpated a right breast mass. She had a mammogram performed that showed dense breasts bilaterally. Ultrasound of the right breast showed 2.3 x 1.9 cm area of abnormality with multiple abnormal lymph nodes. MRI of the bilateral breasts showed asymmetrical enhancement throughout the right breast consistent with multicentric disease. An ultrasound-guided biopsy performed. The known area of disease measured 1.9 x 1.9 x 2.3 cm. Multiple abnormal positive right axillary lymph nodes were noted. 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 Biopsy of the right axillary lymph node was positive for malignancy with extracapsular extension. Patient was originally seen by Dr. Jamey Ripa Dr. Donnie Coffin and Dr. Michell Heinrich. She has elected to have a right mastectomy eventually and declined biopsies of any other areas within the breast.  #2 after extensive discussion patient initially was started on neoadjuvant chemotherapy by Dr. Donnie Coffin. He began her on neoadjuvant FEC given dose dense every 2 weeks with day 2 Neulasta. For 4 cycles.Followed by dose dense Taxotere every 2 weeks for a total of 4 cycles. She began her chemotherapy on 04/26/2012.  She will start Taxotere on 06/21/12.  She unfortunately had a bad hand-foot reaction and this was discontinued.  She will proceed with Gemzar/Carbo every 2 weeks.    CURRENT THERAPY: Gemzar/Carbo Cycle 3 day 1   INTERVAL HISTORY: Nicole Valentine 47 y.o.  female returns for evaluation.  She was thrombocytopenic on Friday and couldn't receive her chemotherapy.  She continues to have an enlarged lymph node today in her right axilla, that is less tender than prior to starting azithromycin, however, it still remains.  Otherwise she denies fevers, chills, or any other concerns.    MEDICAL HISTORY: Past Medical History  Diagnosis Date  . Breast cancer   . Thyroid disease   . Hypothyroidism   . Contact lens/glasses fitting     wears contacts or glasses  . Complication of anesthesia     her father and uncle both had very difficult time waking up-was  something they told them may be hereditory. she will find out.    ALLERGIES:  is allergic to almond meal.  MEDICATIONS:  Current Outpatient Prescriptions  Medication Sig Dispense Refill  . acyclovir (ZOVIRAX) 200 MG capsule       . Alum & Mag Hydroxide-Simeth (MAGIC MOUTHWASH) SOLN Take 5 mLs by mouth 3 (three) times daily.  100 mL  1  . azithromycin (ZITHROMAX) 250 MG tablet 1 tab daily to complete 10 days of antibiotics  5 each  0  . calcium carbonate (OS-CAL - DOSED IN MG OF ELEMENTAL CALCIUM) 1250 MG tablet Take 1 tablet by mouth daily.      . Cholecalciferol (VITAMIN D-3 PO) Take 2,000 Int'l Units by mouth.      . Cyanocobalamin (VITAMIN B-12 PO) Take 5,000 mg by mouth daily.      [name] dexamethasone (DECADRON) 4 MG tablet Take 2 tablets by mouth once a day on the day after chemotherapy and then take 2 tablets two times a day for 2 days.  Take with food.  30 tablet  3  . Ferrous Sulfate Dried (SLOW RELEASE IRON) 45 MG TBCR Take 65 mg by mouth daily.       [name] levothyroxine (SYNTHROID, LEVOTHROID) 200 MCG tablet 150 mcg.       . lidocaine-prilocaine (EMLA) cream Apply topically as needed.  30 g  0  . LORazepam (ATIVAN) 0.5 MG tablet Take 1 tablet (0.5 mg total) by mouth every 6 (six) hours as needed.  30 tablet  0   No current facility-administered medications for this visit.    SURGICAL HISTORY:   Past Surgical History  Procedure Laterality Date  . Wisdom tooth extraction    . Portacath placement  04/22/2012    Procedure: INSERTION PORT-A-CATH;  Surgeon: Currie Paris, MD;  Location: [facility];  Service: General;  Laterality: Left;    REVIEW OF SYSTEMS:  General: fatigue (+), night sweats (-), fever (-), pain (-) Lymph: palpable nodes (-) HEENT: vision changes (-), mucositis (-), gum bleeding (-), epistaxis (-) Cardiovascular: chest pain (-), palpitations (-) Pulmonary: shortness of breath (-), dyspnea on exertion (-), cough (-), hemoptysis (-) GI:  Early satiety (-), melena (-), dysphagia (-), nausea/vomiting (-), diarrhea (-) GU: dysuria (-), hematuria (-), incontinence (-) Musculoskeletal: joint swelling (-), joint pain (-), back pain (-) Neuro: weakness (-), numbness (-), headache (-), confusion (-) Skin: Rash (-), lesions (-), dryness (-) Psych: depression (-), suicidal/homicidal ideation (-), feeling of hopelessness (-)    PHYSICAL EXAMINATION: Blood pressure 121/76, pulse 81, temperature 97.5 F (36.4 C), temperature source Oral, resp. rate 20, height 5\' 7"  (1.702 m), weight 178 lb 12.8 oz (81.103 kg). Body mass index is 28 kg/(m^2). General: Patient is a well appearing female in no acute distress HEENT: PERRLA, sclerae anicteric no conjunctival pallor, MMM Neck: supple, no palpable adenopathy Lungs: clear to auscultation bilaterally, no wheezes, rhonchi, or rales Cardiovascular: regular rate rhythm, S1, S2, no murmurs, rubs or gallops Abdomen: Soft, non-tender, non-distended, normoactive bowel sounds, no HSM Extremities: warm and well perfused, no clubbing, cyanosis, or edema Skin: No rashes or lesions, fingernails are beginning to separate at the edges, no erythema, or exudate from cuticles Neuro: Non-focal ECOG PERFORMANCE STATUS: 0 - Asymptomatic Right breast still does reveal a palpable mass, much softer no axillary masses are noted left  breast no masses or nipple discharge, right axilla with 1cm non-tender enlarged node (slightly smaller), no redness or warmth  LABORATORY DATA: Lab Results  Component Value Date   WBC 4.2 08/12/2012   HGB 10.0* 08/12/2012   HCT 29.1* 08/12/2012   MCV 106.7* 08/12/2012   PLT 137* 08/12/2012      Chemistry      Component Value Date/Time   NA 144 08/12/2012 0823   K 3.7 08/12/2012 0823   CL 106 08/12/2012 0823   CO2 30* 08/12/2012 0823   BUN 10.1 08/12/2012 0823   CREATININE 0.7 08/12/2012 0823      Component Value Date/Time   CALCIUM 9.3 08/12/2012 0823   ALKPHOS 121 08/12/2012 0823   AST 32 08/12/2012 0823   ALT 41 08/12/2012 0823   BILITOT 0.48 08/12/2012 0823       RADIOGRAPHIC STUDIES:  No results Valentine.  ASSESSMENT: 47 year old female with  #1 high risk stage IIB invasive ductal carcinomaOf the right breast. The tumor is triple negative with a Ki-67 of 25%. She is currently receiving neoadjuvant chemotherapy consisting of FEC being given dose dense.Thus far she is tolerating it well although she  does become quite neutropenic.  #2 once patient completes her FEC for 4 cycles she will proceed with dose dense Taxotere for a total of 4 cycles.  Thsi was changed to Gemzar Carbo after hand-foot reaction to taxotere.    #3 upon completion of her chemotherapy she will proceed with mastectomy and axillary lymph node dissection of the right breast and axilla. Post mastectomy patient will need radiation therapy likely since she did have multifocal disease. But we will leave that to the radiation oncologist   PLAN:   1. Nicole Valentine labs have improved.   2. patient will proceed with chemotherapy today.  #3 we will obtain a CT of the chest. This is to further evaluate the right axillary lymph node.  All questions were answered. The patient knows to call the clinic with any problems, questions or concerns. We can certainly see the patient much sooner if necessary.  I spent 25 minutes  counseling the patient face to face. The total time spent in the appointment was 30 minutes.  Cherie Ouch Lyn Hollingshead, NP Medical Oncology [facility] Phone: [phone] 08/12/2012, 9:06 AM

## 2012-08-12 NOTE — Patient Instructions (Addendum)
Proceed with chemotherapy today  CT chest  as soon as psossible  I will see you back on 3/28

## 2012-08-12 NOTE — Patient Instructions (Signed)
[facility] Discharge Instructions for Patients Receiving Chemotherapy  Today you received the following chemotherapy agents gemzar, carboplatin  To help prevent nausea and vomiting after your treatment, we encourage you to take your nausea medication as needed   If you develop nausea and vomiting that is not controlled by your nausea medication, call the clinic. If it is after clinic hours your family physician or the after hours number for the clinic or go to the Emergency Department.   BELOW ARE SYMPTOMS THAT SHOULD BE REPORTED IMMEDIATELY:  *FEVER GREATER THAN 100.5 F  *CHILLS WITH OR WITHOUT FEVER  NAUSEA AND VOMITING THAT IS NOT CONTROLLED WITH YOUR NAUSEA MEDICATION  *UNUSUAL SHORTNESS OF BREATH  *UNUSUAL BRUISING OR BLEEDING  TENDERNESS IN MOUTH AND THROAT WITH OR WITHOUT PRESENCE OF ULCERS  *URINARY PROBLEMS  *BOWEL PROBLEMS  UNUSUAL RASH Items with * indicate a potential emergency and should be followed up as soon as possible.  The clinic phone number is [phone].

## 2012-08-13 ENCOUNTER — Encounter: Payer: Self-pay | Admitting: Oncology

## 2012-08-13 ENCOUNTER — Encounter (HOSPITAL_COMMUNITY): Payer: Self-pay

## 2012-08-13 ENCOUNTER — Ambulatory Visit (HOSPITAL_COMMUNITY)
Admission: RE | Admit: 2012-08-13 | Discharge: 2012-08-13 | Disposition: A | Discharge status: Home / Self Care | Payer: BC Managed Care – PPO | Source: Ambulatory Visit | Attending: Oncology | Admitting: Oncology

## 2012-08-13 ENCOUNTER — Ambulatory Visit (HOSPITAL_BASED_OUTPATIENT_CLINIC_OR_DEPARTMENT_OTHER): Payer: BC Managed Care – PPO

## 2012-08-13 VITALS — BP 137/70 | HR 74 | Temp 97.2°F

## 2012-08-13 DIAGNOSIS — C50919 Malignant neoplasm of unspecified site of unspecified female breast: Secondary | ICD-10-CM

## 2012-08-13 DIAGNOSIS — Z5189 Encounter for other specified aftercare: Secondary | ICD-10-CM

## 2012-08-13 DIAGNOSIS — C773 Secondary and unspecified malignant neoplasm of axilla and upper limb lymph nodes: Secondary | ICD-10-CM

## 2012-08-13 DIAGNOSIS — C50411 Malignant neoplasm of upper-outer quadrant of right female breast: Secondary | ICD-10-CM

## 2012-08-13 DIAGNOSIS — C50419 Malignant neoplasm of upper-outer quadrant of unspecified female breast: Secondary | ICD-10-CM

## 2012-08-13 MED ORDER — PEGFILGRASTIM INJECTION 6 MG/0.6ML
6.0000 mg | Freq: Once | SUBCUTANEOUS | Status: AC
  Administered 2012-08-13: 6 mg via SUBCUTANEOUS
  Filled 2012-08-13: qty 0.6

## 2012-08-13 MED ORDER — IOHEXOL 300 MG/ML  SOLN
80.0000 mL | Freq: Once | INTRAMUSCULAR | Status: AC | PRN
  Administered 2012-08-13: 80 mL via INTRAVENOUS

## 2012-08-13 NOTE — Patient Instructions (Addendum)

## 2012-08-16 ENCOUNTER — Ambulatory Visit (HOSPITAL_BASED_OUTPATIENT_CLINIC_OR_DEPARTMENT_OTHER): Payer: BC Managed Care – PPO | Admitting: Oncology

## 2012-08-16 ENCOUNTER — Other Ambulatory Visit (HOSPITAL_BASED_OUTPATIENT_CLINIC_OR_DEPARTMENT_OTHER): Payer: BC Managed Care – PPO | Admitting: Lab

## 2012-08-16 ENCOUNTER — Telehealth: Payer: Self-pay | Admitting: Oncology

## 2012-08-16 VITALS — BP 113/70 | HR 105 | Temp 98.6°F | Resp 20 | Ht 67.0 in | Wt 175.6 lb

## 2012-08-16 DIAGNOSIS — Z171 Estrogen receptor negative status [ER-]: Secondary | ICD-10-CM

## 2012-08-16 DIAGNOSIS — C773 Secondary and unspecified malignant neoplasm of axilla and upper limb lymph nodes: Secondary | ICD-10-CM

## 2012-08-16 DIAGNOSIS — C50411 Malignant neoplasm of upper-outer quadrant of right female breast: Secondary | ICD-10-CM

## 2012-08-16 DIAGNOSIS — C50419 Malignant neoplasm of upper-outer quadrant of unspecified female breast: Secondary | ICD-10-CM

## 2012-08-16 LAB — COMPREHENSIVE METABOLIC PANEL (CC13)
AST: 27 U/L (ref 5–34)
Albumin: 3.2 g/dL — ABNORMAL LOW (ref 3.5–5.0)
Alkaline Phosphatase: 126 U/L (ref 40–150)
BUN: 15.2 mg/dL (ref 7.0–26.0)
Potassium: 3.9 mEq/L (ref 3.5–5.1)

## 2012-08-16 LAB — CBC WITH DIFFERENTIAL/PLATELET
Basophils Absolute: 0 10*3/uL (ref 0.0–0.1)
EOS%: 0 % (ref 0.0–7.0)
HGB: 9 g/dL — ABNORMAL LOW (ref 11.6–15.9)
MCH: 35.2 pg — ABNORMAL HIGH (ref 25.1–34.0)
MCHC: 32.5 g/dL (ref 31.5–36.0)
MCV: 108.4 fL — ABNORMAL HIGH (ref 79.5–101.0)
MONO%: 1 % (ref 0.0–14.0)
RBC: 2.55 10*6/uL — ABNORMAL LOW (ref 3.70–5.45)
RDW: 20.9 % — ABNORMAL HIGH (ref 11.2–14.5)

## 2012-08-16 NOTE — Patient Instructions (Addendum)
Proceed with chemotherapy on 08/27/12  We will see you back on 4/8 and 4/17

## 2012-08-16 NOTE — Telephone Encounter (Signed)
gv pt appt schedule for April.  °

## 2012-08-23 ENCOUNTER — Ambulatory Visit: Payer: BC Managed Care – PPO | Admitting: Oncology

## 2012-08-23 ENCOUNTER — Other Ambulatory Visit: Payer: BC Managed Care – PPO | Admitting: Lab

## 2012-08-23 ENCOUNTER — Ambulatory Visit: Payer: BC Managed Care – PPO

## 2012-08-24 ENCOUNTER — Ambulatory Visit: Payer: BC Managed Care – PPO

## 2012-08-26 ENCOUNTER — Other Ambulatory Visit: Payer: Self-pay | Admitting: Medical Oncology

## 2012-08-26 DIAGNOSIS — C50411 Malignant neoplasm of upper-outer quadrant of right female breast: Secondary | ICD-10-CM

## 2012-08-27 ENCOUNTER — Ambulatory Visit: Payer: BC Managed Care – PPO

## 2012-08-27 ENCOUNTER — Ambulatory Visit (HOSPITAL_BASED_OUTPATIENT_CLINIC_OR_DEPARTMENT_OTHER): Payer: BC Managed Care – PPO | Admitting: Oncology

## 2012-08-27 ENCOUNTER — Other Ambulatory Visit (HOSPITAL_BASED_OUTPATIENT_CLINIC_OR_DEPARTMENT_OTHER): Payer: BC Managed Care – PPO | Admitting: Lab

## 2012-08-27 ENCOUNTER — Telehealth: Payer: Self-pay | Admitting: Oncology

## 2012-08-27 VITALS — BP 128/74 | HR 102 | Temp 98.2°F | Resp 20 | Ht 67.0 in | Wt 180.0 lb

## 2012-08-27 DIAGNOSIS — C50419 Malignant neoplasm of upper-outer quadrant of unspecified female breast: Secondary | ICD-10-CM

## 2012-08-27 DIAGNOSIS — C50411 Malignant neoplasm of upper-outer quadrant of right female breast: Secondary | ICD-10-CM

## 2012-08-27 DIAGNOSIS — Z171 Estrogen receptor negative status [ER-]: Secondary | ICD-10-CM

## 2012-08-27 DIAGNOSIS — D709 Neutropenia, unspecified: Secondary | ICD-10-CM

## 2012-08-27 DIAGNOSIS — C773 Secondary and unspecified malignant neoplasm of axilla and upper limb lymph nodes: Secondary | ICD-10-CM

## 2012-08-27 DIAGNOSIS — C50919 Malignant neoplasm of unspecified site of unspecified female breast: Secondary | ICD-10-CM

## 2012-08-27 LAB — CBC WITH DIFFERENTIAL/PLATELET
Basophils Absolute: 0 10*3/uL (ref 0.0–0.1)
Eosinophils Absolute: 0 10*3/uL (ref 0.0–0.5)
HCT: 25.7 % — ABNORMAL LOW (ref 34.8–46.6)
HGB: 8.5 g/dL — ABNORMAL LOW (ref 11.6–15.9)
MCV: 108 fL — ABNORMAL HIGH (ref 79.5–101.0)
MONO%: 10.6 % (ref 0.0–14.0)
NEUT#: 3.5 10*3/uL (ref 1.5–6.5)
NEUT%: 66.7 % (ref 38.4–76.8)
Platelets: 59 10*3/uL — ABNORMAL LOW (ref 145–400)
RDW: 16.4 % — ABNORMAL HIGH (ref 11.2–14.5)

## 2012-08-27 NOTE — Telephone Encounter (Signed)
gv pt appt schedule for April. Pt aware [facility] will call her re breast mri appt. Lmonvm Cathy @ the [facility] re appt for breast mri.

## 2012-08-27 NOTE — Patient Instructions (Addendum)
Hold chemotherapy today  CBC on 4/10  Rescheduled chemotherapy to 4/11,

## 2012-08-28 ENCOUNTER — Ambulatory Visit: Payer: BC Managed Care – PPO

## 2012-08-29 ENCOUNTER — Other Ambulatory Visit (HOSPITAL_BASED_OUTPATIENT_CLINIC_OR_DEPARTMENT_OTHER): Payer: BC Managed Care – PPO | Admitting: Lab

## 2012-08-29 DIAGNOSIS — C50919 Malignant neoplasm of unspecified site of unspecified female breast: Secondary | ICD-10-CM

## 2012-08-29 LAB — CBC WITH DIFFERENTIAL/PLATELET
BASO%: 0.3 % (ref 0.0–2.0)
Basophils Absolute: 0 10e3/uL (ref 0.0–0.1)
EOS%: 0.3 % (ref 0.0–7.0)
Eosinophils Absolute: 0 10e3/uL (ref 0.0–0.5)
HCT: 26.9 % — ABNORMAL LOW (ref 34.8–46.6)
HGB: 9 g/dL — ABNORMAL LOW (ref 11.6–15.9)
LYMPH%: 21 % (ref 14.0–49.7)
MCH: 36.1 pg — ABNORMAL HIGH (ref 25.1–34.0)
MCHC: 33.5 g/dL (ref 31.5–36.0)
MCV: 108 fL — ABNORMAL HIGH (ref 79.5–101.0)
MONO#: 0.7 10e3/uL (ref 0.1–0.9)
MONO%: 11.6 % (ref 0.0–14.0)
NEUT#: 4 10e3/uL (ref 1.5–6.5)
NEUT%: 66.8 % (ref 38.4–76.8)
Platelets: 96 10e3/uL — ABNORMAL LOW (ref 145–400)
RBC: 2.49 10e6/uL — ABNORMAL LOW (ref 3.70–5.45)
RDW: 16.8 % — ABNORMAL HIGH (ref 11.2–14.5)
WBC: 5.9 10e3/uL (ref 3.9–10.3)
lymph#: 1.3 10e3/uL (ref 0.9–3.3)

## 2012-08-30 ENCOUNTER — Ambulatory Visit (HOSPITAL_BASED_OUTPATIENT_CLINIC_OR_DEPARTMENT_OTHER): Payer: BC Managed Care – PPO

## 2012-08-30 ENCOUNTER — Other Ambulatory Visit: Payer: BC Managed Care – PPO | Admitting: Lab

## 2012-08-30 ENCOUNTER — Ambulatory Visit: Payer: BC Managed Care – PPO | Admitting: Adult Health

## 2012-08-30 DIAGNOSIS — Z5111 Encounter for antineoplastic chemotherapy: Secondary | ICD-10-CM

## 2012-08-30 DIAGNOSIS — C50919 Malignant neoplasm of unspecified site of unspecified female breast: Secondary | ICD-10-CM

## 2012-08-30 DIAGNOSIS — C50419 Malignant neoplasm of upper-outer quadrant of unspecified female breast: Secondary | ICD-10-CM

## 2012-08-30 DIAGNOSIS — C773 Secondary and unspecified malignant neoplasm of axilla and upper limb lymph nodes: Secondary | ICD-10-CM

## 2012-08-30 MED ORDER — SODIUM CHLORIDE 0.9 % IV SOLN
800.0000 mg/m2 | Freq: Once | INTRAVENOUS | Status: AC
  Administered 2012-08-30: 1520 mg via INTRAVENOUS
  Filled 2012-08-30: qty 40.02

## 2012-08-30 MED ORDER — DEXAMETHASONE SODIUM PHOSPHATE 10 MG/ML IJ SOLN
10.0000 mg | Freq: Once | INTRAMUSCULAR | Status: AC
  Administered 2012-08-30: 10 mg via INTRAVENOUS

## 2012-08-30 MED ORDER — SODIUM CHLORIDE 0.9 % IJ SOLN
10.0000 mL | INTRAMUSCULAR | Status: DC | PRN
  Administered 2012-08-30: 10 mL
  Filled 2012-08-30: qty 10

## 2012-08-30 MED ORDER — ONDANSETRON 8 MG/50ML IVPB (CHCC)
8.0000 mg | Freq: Once | INTRAVENOUS | Status: AC
  Administered 2012-08-30: 8 mg via INTRAVENOUS

## 2012-08-30 MED ORDER — SODIUM CHLORIDE 0.9 % IV SOLN
730.0000 mg | Freq: Once | INTRAVENOUS | Status: AC
  Administered 2012-08-30: 730 mg via INTRAVENOUS
  Filled 2012-08-30: qty 73

## 2012-08-30 MED ORDER — HEPARIN SOD (PORK) LOCK FLUSH 100 UNIT/ML IV SOLN
500.0000 [IU] | Freq: Once | INTRAVENOUS | Status: AC | PRN
  Administered 2012-08-30: 500 [IU]
  Filled 2012-08-30: qty 5

## 2012-08-30 MED ORDER — SODIUM CHLORIDE 0.9 % IV SOLN
Freq: Once | INTRAVENOUS | Status: AC
  Administered 2012-08-30: 13:00:00 via INTRAVENOUS

## 2012-08-30 NOTE — Patient Instructions (Addendum)
[facility] Discharge Instructions for Patients Receiving Chemotherapy  Today you received the following chemotherapy agents Carboplatin and Gemzar  To help prevent nausea and vomiting after your treatment, we encourage you to take your nausea medication as needed..   If you develop nausea and vomiting that is not controlled by your nausea medication, call the clinic. If it is after clinic hours your family physician or the after hours number for the clinic or go to the Emergency Department.   BELOW ARE SYMPTOMS THAT SHOULD BE REPORTED IMMEDIATELY:  *FEVER GREATER THAN 100.5 F  *CHILLS WITH OR WITHOUT FEVER  NAUSEA AND VOMITING THAT IS NOT CONTROLLED WITH YOUR NAUSEA MEDICATION  *UNUSUAL SHORTNESS OF BREATH  *UNUSUAL BRUISING OR BLEEDING  TENDERNESS IN MOUTH AND THROAT WITH OR WITHOUT PRESENCE OF ULCERS  *URINARY PROBLEMS  *BOWEL PROBLEMS  UNUSUAL RASH Items with * indicate a potential emergency and should be followed up as soon as possible.   Feel free to call the clinic you have any questions or concerns. The clinic phone number is [phone].   I have been informed and understand all the instructions given to me. I know to contact the clinic, my physician, or go to the Emergency Department if any problems should occur. I do not have any questions at this time, but understand that I may call the clinic during office hours   should I have any questions or need assistance in obtaining follow up care.

## 2012-08-31 ENCOUNTER — Ambulatory Visit (HOSPITAL_BASED_OUTPATIENT_CLINIC_OR_DEPARTMENT_OTHER): Payer: BC Managed Care – PPO

## 2012-08-31 VITALS — BP 118/59 | HR 83 | Temp 98.7°F

## 2012-08-31 DIAGNOSIS — C773 Secondary and unspecified malignant neoplasm of axilla and upper limb lymph nodes: Secondary | ICD-10-CM

## 2012-08-31 DIAGNOSIS — Z5189 Encounter for other specified aftercare: Secondary | ICD-10-CM

## 2012-08-31 DIAGNOSIS — C50919 Malignant neoplasm of unspecified site of unspecified female breast: Secondary | ICD-10-CM

## 2012-08-31 DIAGNOSIS — C50419 Malignant neoplasm of upper-outer quadrant of unspecified female breast: Secondary | ICD-10-CM

## 2012-08-31 MED ORDER — PEGFILGRASTIM INJECTION 6 MG/0.6ML
6.0000 mg | Freq: Once | SUBCUTANEOUS | Status: AC
  Administered 2012-08-31: 6 mg via SUBCUTANEOUS

## 2012-08-31 NOTE — Patient Instructions (Signed)
Call MD for problems or concerns 

## 2012-09-03 ENCOUNTER — Other Ambulatory Visit: Payer: Self-pay

## 2012-09-03 DIAGNOSIS — C50911 Malignant neoplasm of unspecified site of right female breast: Secondary | ICD-10-CM

## 2012-09-03 DIAGNOSIS — C50411 Malignant neoplasm of upper-outer quadrant of right female breast: Secondary | ICD-10-CM

## 2012-09-04 ENCOUNTER — Ambulatory Visit: Payer: BC Managed Care – PPO | Admitting: Adult Health

## 2012-09-04 ENCOUNTER — Other Ambulatory Visit: Payer: BC Managed Care – PPO | Admitting: Lab

## 2012-09-04 ENCOUNTER — Other Ambulatory Visit: Payer: Self-pay

## 2012-09-05 ENCOUNTER — Other Ambulatory Visit: Payer: Self-pay | Admitting: *Deleted

## 2012-09-05 ENCOUNTER — Ambulatory Visit
Admission: RE | Admit: 2012-09-05 | Discharge: 2012-09-05 | Disposition: A | Discharge status: Home / Self Care | Payer: BC Managed Care – PPO | Source: Ambulatory Visit | Attending: Oncology | Admitting: Oncology

## 2012-09-05 ENCOUNTER — Other Ambulatory Visit: Payer: BC Managed Care – PPO | Admitting: Lab

## 2012-09-05 ENCOUNTER — Ambulatory Visit: Payer: BC Managed Care – PPO | Admitting: Family

## 2012-09-05 DIAGNOSIS — C50919 Malignant neoplasm of unspecified site of unspecified female breast: Secondary | ICD-10-CM

## 2012-09-05 MED ORDER — GADOBENATE DIMEGLUMINE 529 MG/ML IV SOLN
17.0000 mL | Freq: Once | INTRAVENOUS | Status: AC | PRN
  Administered 2012-09-05: 17 mL via INTRAVENOUS

## 2012-09-05 MED ORDER — LORAZEPAM 0.5 MG PO TABS: 0.5000 mg | ORAL_TABLET | Freq: Four times a day (QID) | ORAL | Status: DC | PRN

## 2012-09-06 ENCOUNTER — Other Ambulatory Visit: Payer: BC Managed Care – PPO | Admitting: Lab

## 2012-09-06 ENCOUNTER — Ambulatory Visit: Payer: BC Managed Care – PPO

## 2012-09-06 ENCOUNTER — Ambulatory Visit: Payer: BC Managed Care – PPO | Admitting: Adult Health

## 2012-09-06 ENCOUNTER — Encounter: Payer: Self-pay | Admitting: Medical Oncology

## 2012-09-06 ENCOUNTER — Telehealth: Payer: Self-pay | Admitting: Medical Oncology

## 2012-09-06 NOTE — Telephone Encounter (Signed)
Pt called requesting to know what the plan is now that she has had her MRI (09/05/12). Reviewed with Augustin Schooling, NP per NP patient needs to see her surgeon and informed patient of that, as well as informed pt that Dr Welton Flakes is out of town till Monday and I will review with Dr Welton Flakes and call pt back. Patient expressed understanding. No questions at this time. Patient with no sched appts at this time.

## 2012-09-07 ENCOUNTER — Ambulatory Visit: Payer: BC Managed Care – PPO

## 2012-09-09 ENCOUNTER — Ambulatory Visit: Payer: BC Managed Care – PPO | Admitting: Adult Health

## 2012-09-10 ENCOUNTER — Telehealth: Payer: Self-pay | Admitting: Medical Oncology

## 2012-09-10 NOTE — Telephone Encounter (Signed)
Patient called and stated that she was unaware of any scheduled appts and that she was not informed of any scheduled appts but according to Mychart she missed an appt, stated she wanted the office to know that no one notified her of these appts.   Patient also asking when is her next appt with MD? She currently has no scheduled appts other than with Dr Jamey Ripa 04/29 @ 4:30 Will review with MD/NP patients request.

## 2012-09-10 NOTE — Progress Notes (Signed)
OFFICE PROGRESS NOTE  CC  [name], MD [address] [place] [address] Dr. Fatima Sanger Dr. 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 Diagnosed November 2013. Clinical stage II (T2 N1)  PRIOR THERAPY:  #1 patient originally presented to the multidisciplinary breast clinic on 04/10/2012. She originally palpated a right breast mass. She had a mammogram performed that showed dense breasts bilaterally. Ultrasound of the right breast showed 2.3 x 1.9 cm area of abnormality with multiple abnormal lymph nodes. MRI of the bilateral breasts showed asymmetrical enhancement throughout the right breast consistent with multicentric disease. An ultrasound-guided biopsy performed. The known area of disease measured 1.9 x 1.9 x 2.3 cm. Multiple abnormal positive right axillary lymph nodes were noted. 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 Biopsy of the right axillary lymph node was positive for malignancy with extracapsular extension. Patient was originally seen by Dr. Jamey Ripa Dr. Donnie Coffin and Dr. Michell Heinrich. She has elected to have a right mastectomy eventually and declined biopsies of any other areas within the breast.  #2 after extensive discussion patient initially was started on neoadjuvant chemotherapy by Dr. Donnie Coffin. He began her on neoadjuvant FEC given dose dense every 2 weeks with day 2 Neulasta. For 4 cycles.Followed by dose dense Taxotere every 2 weeks for a total of 4 cycles. She began her chemotherapy on 04/26/2012.  She will start Taxotere on 06/21/12.  She unfortunately had a bad hand-foot reaction and this was discontinued.  She will proceed with Gemzar/Carbo every 2 weeks.    CURRENT THERAPY: for Gemzar/Carbo Cycle 4 day 1 on 4/8  INTERVAL HISTORY: Maribella Kuna 47  y.o. female returns for evaluation.  Overall she's doing much better her platelets have improved. She is denying any nausea vomiting fevers chills night sweats headaches she has no shortness of breath no chest pains palpitations or discuss CT scan results. She is pleased. She does want to continue with her chemotherapy. Remainder of the 10 point review of systems is negative.   MEDICAL HISTORY: Past Medical History  Diagnosis Date  . Thyroid disease   . Hypothyroidism   . Contact lens/glasses fitting     wears contacts or glasses  . Complication of anesthesia     her father and uncle both had very difficult time waking up-was  something they told them may be hereditory. she will find out.  . Breast cancer dx'd 04-10-12-rt    ALLERGIES:  is allergic to almond meal.  MEDICATIONS:  Current Outpatient Prescriptions  Medication Sig Dispense Refill  . calcium carbonate (OS-CAL - DOSED IN MG OF ELEMENTAL CALCIUM) 1250 MG tablet Take 1 tablet by mouth daily.      . Cholecalciferol (VITAMIN D-3 PO) Take 2,000 Int'l Units by mouth.      . Cyanocobalamin (VITAMIN B-12 PO) Take 5,000 mg by mouth daily.      [name] dexamethasone (DECADRON) 4 MG tablet Take 2 tablets by mouth once a day on the day after chemotherapy and then take 2 tablets two times a day for 2 days. Take with food.  30 tablet  3  . Ferrous Sulfate Dried (SLOW RELEASE IRON) 45 MG TBCR Take 65 mg by mouth daily.       [name] levothyroxine (SYNTHROID, LEVOTHROID) 200 MCG tablet 150 mcg.       [name]  lidocaine-prilocaine (EMLA) cream Apply topically as needed.  30 g  0  . Alum & Mag Hydroxide-Simeth (MAGIC MOUTHWASH) SOLN Take 5 mLs by mouth 3 (three) times daily.  100 mL  1  . LORazepam (ATIVAN) 0.5 MG tablet Take 1 tablet (0.5 mg total) by mouth every 6 (six) hours as needed.  30 tablet  0   No current facility-administered medications for this visit.    SURGICAL HISTORY:  Past Surgical History  Procedure Laterality Date  . Wisdom tooth  extraction    . Portacath placement  04/22/2012    Procedure: INSERTION PORT-A-CATH;  Surgeon: Currie Paris, MD;  Location: [facility];  Service: General;  Laterality: Left;    REVIEW OF SYSTEMS:  General: fatigue (+), night sweats (-), fever (-), pain (-) Lymph: palpable nodes (-) HEENT: vision changes (-), mucositis (-), gum bleeding (-), epistaxis (-) Cardiovascular: chest pain (-), palpitations (-) Pulmonary: shortness of breath (-), dyspnea on exertion (-), cough (-), hemoptysis (-) GI:  Early satiety (-), melena (-), dysphagia (-), nausea/vomiting (-), diarrhea (-) GU: dysuria (-), hematuria (-), incontinence (-) Musculoskeletal: joint swelling (-), joint pain (-), back pain (-) Neuro: weakness (-), numbness (-), headache (-), confusion (-) Skin: Rash (-), lesions (-), dryness (-) Psych: depression (-), suicidal/homicidal ideation (-), feeling of hopelessness (-)    PHYSICAL EXAMINATION: Blood pressure 113/70, pulse 105, temperature 98.6 F (37 C), temperature source Oral, resp. rate 20, height 5\' 7"  (1.702 m), weight 175 lb 9.6 oz (79.652 kg). Body mass index is 27.5 kg/(m^2). General: Patient is a well appearing female in no acute distress HEENT: PERRLA, sclerae anicteric no conjunctival pallor, MMM Neck: supple, no palpable adenopathy Lungs: clear to auscultation bilaterally, no wheezes, rhonchi, or rales Cardiovascular: regular rate rhythm, S1, S2, no murmurs, rubs or gallops Abdomen: Soft, non-tender, non-distended, normoactive bowel sounds, no HSM Extremities: warm and well perfused, no clubbing, cyanosis, or edema Skin: No rashes or lesions, fingernails are beginning to separate at the edges, no erythema, or exudate from cuticles Neuro: Non-focal ECOG PERFORMANCE STATUS: 0 - Asymptomatic Right breast still does reveal a palpable mass, much softer no axillary masses are noted left breast no masses or nipple discharge, right axilla with 1cm non-tender  enlarged node (slightly smaller), no redness or warmth  LABORATORY DATA: Lab Results  Component Value Date   WBC 5.9 08/29/2012   HGB 9.0* 08/29/2012   HCT 26.9* 08/29/2012   MCV 108.0* 08/29/2012   PLT 96* 08/29/2012      Chemistry      Component Value Date/Time   NA 140 08/16/2012 0854   K 3.9 08/16/2012 0854   CL 104 08/16/2012 0854   CO2 27 08/16/2012 0854   BUN 15.2 08/16/2012 0854   CREATININE 0.7 08/16/2012 0854      Component Value Date/Time   CALCIUM 9.3 08/16/2012 0854   ALKPHOS 126 08/16/2012 0854   AST 27 08/16/2012 0854   ALT 48 08/16/2012 0854   BILITOT 1.01 08/16/2012 0854       RADIOGRAPHIC STUDIES:  No results found.  ASSESSMENT: 47 year old female with  #1 high risk stage IIB invasive ductal carcinomaOf the right breast. The tumor is triple negative with a Ki-67 of 25%. She is currently receiving neoadjuvant chemotherapy consisting of FEC being given dose dense.Thus far she is tolerating it well although she does become quite neutropenic.  #2 once patient completes her FEC for 4 cycles she will proceed with dose dense Taxotere for a total  of 4 cycles.  Thsi was changed to Gemzar Carbo after hand-foot reaction to taxotere.    #3 upon completion of her chemotherapy she will proceed with mastectomy and axillary lymph node dissection of the right breast and axilla. Post mastectomy patient will need radiation therapy likely since she did have multifocal disease. But we will leave that to the radiation oncologist  #4 patient had CT of the chest performed to evaluate the right axillary lymph node were discussed at results in detail today.   PLAN:  #1 patient will proceed with her scheduled chemotherapy on 08/27/2012.  #2 she'll be seen back on 4/17 for followup.  All questions were answered. The patient knows to call the clinic with any problems, questions or concerns. We can certainly see the patient much sooner if necessary.  I spent 25 minutes counseling the  patient face to face. The total time spent in the appointment was 30 minutes.  Drue Second, MD Medical/Oncology [facility] [phone] (beeper) [phone] (Office)

## 2012-09-10 NOTE — Telephone Encounter (Signed)
She can see me after she sees Dr. Jamey Ripa , can you get her scheduled for the first week of May?

## 2012-09-11 NOTE — Telephone Encounter (Signed)
Per MD, informed pt that sched will call her to set up appt to see Dr Welton Flakes first week of May, after her appt with Dr Jamey Ripa. Patient verbalized understanding. Knows to call office with any questions or concerns. Onc tx sent.

## 2012-09-13 ENCOUNTER — Other Ambulatory Visit: Payer: BC Managed Care – PPO | Admitting: Lab

## 2012-09-13 ENCOUNTER — Ambulatory Visit: Payer: BC Managed Care – PPO | Admitting: Adult Health

## 2012-09-17 ENCOUNTER — Encounter (INDEPENDENT_AMBULATORY_CARE_PROVIDER_SITE_OTHER): Payer: Self-pay | Admitting: Surgery

## 2012-09-17 ENCOUNTER — Ambulatory Visit (INDEPENDENT_AMBULATORY_CARE_PROVIDER_SITE_OTHER): Payer: BC Managed Care – PPO | Admitting: Surgery

## 2012-09-17 VITALS — BP 122/82 | HR 84 | Temp 99.0°F | Resp 18 | Ht 67.5 in | Wt 181.0 lb

## 2012-09-17 DIAGNOSIS — C50419 Malignant neoplasm of upper-outer quadrant of unspecified female breast: Secondary | ICD-10-CM

## 2012-09-17 DIAGNOSIS — C50411 Malignant neoplasm of upper-outer quadrant of right female breast: Secondary | ICD-10-CM

## 2012-09-17 NOTE — Progress Notes (Signed)
NAME: Nicole Valentine       DOB: 02-Oct-1965           DATE: 09/17/2012       HYQ:[id number]  CC:  Chief Complaint  Patient presents with  . Breast Cancer    discuss surgery during neoadjuvant chemotherapy    HPI: this patient is currently undergoing neoadjuvant chemotherapy for her stage II right breast cancer. She has 2 chemotherapy treatments left and comes for surgical planning. She seems to be tolerating her chemotherapy well with the exception of low white counts which has made the interval between treatments somewhat longer. Her last treatment was about 3 weeks ago. She recently had an MRI scan which I reviewed with her. The index lesion is resolved and then change in the multiple other lesions seen indicating resolution. In addition her lymph nodes appear smaller and less involved with largest now at 8 mm. She is thinking about having a prophylactic left mastectomy. She is interested reconstruction but understands that she will need radiation therapy post mastectomy and will need a deferred reconstruction   EXAM: Vital signs: BP 122/82  Pulse 84  Temp(Src) 99 F (37.2 C)  Resp 18  Ht 5' 7.5" (1.715 m)  Wt 181 lb (82.101 kg)  BMI 27.91 kg/m2  General: Patient alert, oriented, NAD  The breasts are symmetric. There is no palpable mass either side. The skin and nipple areas looked normal. Lymphatics: There is no axillary she history adenopathy noted.  Data reviewed: I reviewed the MRI, the oncology notes, and the original notes. IMP: clinical stage II right breast cancer upper outer quadrant, triple negative, with complete clinical remission in the breasts by exam  PLAN: I had a long discussion with the patient and her husband about options. I think her cancer was at least multifocal on the right and despite the good clinical response I think she would best be served by mastectomy. In fact, that is what she would like to have done. I think she is a lymph node dissection because she  had involved lymph nodes although we could discuss that with the radiation therapist and the oncologist. Currently of the opinion that she ought to finish her chemotherapy before any surgery and will go ahead and get a plastic surgical consultation arranged. I told her I would discuss all these issues with her medical oncologist tomorrow and then we can followup. If we do surgery after her last chemotherapy he will need to be deferred 3-4 weeks because she has dropped her white cell count and platelet count after each chemotherapy and we need to have those recover prior to surgery to reduce the chance of infection. I did tell her it would be a potential for a left prophylactic mastectomy and immediate left reconstruction if she wished to do that at the same time as a right mastectomy. She is going to think over her options and once we get back with her tomorrow or the next day we can make definitive plans.  Larell Baney J 09/17/2012

## 2012-09-17 NOTE — Patient Instructions (Signed)
I will discuss your situation with Dr Welton Flakes and we cam make more definitive plans for surgery after that

## 2012-09-18 ENCOUNTER — Telehealth: Payer: Self-pay | Admitting: Medical Oncology

## 2012-09-18 ENCOUNTER — Telehealth (INDEPENDENT_AMBULATORY_CARE_PROVIDER_SITE_OTHER): Payer: Self-pay | Admitting: General Surgery

## 2012-09-18 NOTE — Addendum Note (Signed)
Addended byLiliana Cline on: 09/18/2012 08:38 AM   Modules accepted: Orders

## 2012-09-18 NOTE — Telephone Encounter (Signed)
Schedule chemo labs and MD visit on 11:45 on 5/2

## 2012-09-18 NOTE — Telephone Encounter (Signed)
Pt asking to schedule last 2 chemo treatments before her surgery. Patient states per Dr Jamey Ripa wants her to have her chemo completed before surgery, otherwise she won't be able to have tx for 5-6 weeks after surgery. Informed pt will forward mssg to MD. Patient does not wish to wait till 05/09 appt for an answer. Pt asked about having tx tomorrow, informed pt there are no avail appts in infusion for tomorrow. Restated to patient will let Dr Welton Flakes know her request to be sched for tx asap.  Mssg forwarded to MD. Pt with MD appt 05/09

## 2012-09-18 NOTE — Telephone Encounter (Signed)
Left message with patient she has appt with Dr Kelly Splinter 10/15/12 @9am  notes were faxed

## 2012-09-19 ENCOUNTER — Telehealth: Payer: Self-pay | Admitting: Oncology

## 2012-09-20 ENCOUNTER — Ambulatory Visit: Payer: BC Managed Care – PPO

## 2012-09-20 ENCOUNTER — Ambulatory Visit (HOSPITAL_BASED_OUTPATIENT_CLINIC_OR_DEPARTMENT_OTHER): Payer: BC Managed Care – PPO | Admitting: Oncology

## 2012-09-20 ENCOUNTER — Other Ambulatory Visit: Payer: BC Managed Care – PPO | Admitting: Lab

## 2012-09-20 ENCOUNTER — Encounter: Payer: Self-pay | Admitting: Oncology

## 2012-09-20 ENCOUNTER — Other Ambulatory Visit (HOSPITAL_BASED_OUTPATIENT_CLINIC_OR_DEPARTMENT_OTHER): Payer: BC Managed Care – PPO | Admitting: Lab

## 2012-09-20 ENCOUNTER — Telehealth: Payer: Self-pay | Admitting: Oncology

## 2012-09-20 ENCOUNTER — Other Ambulatory Visit: Payer: Self-pay | Admitting: *Deleted

## 2012-09-20 ENCOUNTER — Ambulatory Visit (HOSPITAL_BASED_OUTPATIENT_CLINIC_OR_DEPARTMENT_OTHER): Payer: BC Managed Care – PPO

## 2012-09-20 ENCOUNTER — Ambulatory Visit: Payer: BC Managed Care – PPO | Admitting: Adult Health

## 2012-09-20 VITALS — BP 124/77 | HR 78 | Temp 98.1°F | Resp 20 | Ht 67.5 in | Wt 180.2 lb

## 2012-09-20 DIAGNOSIS — C50419 Malignant neoplasm of upper-outer quadrant of unspecified female breast: Secondary | ICD-10-CM

## 2012-09-20 DIAGNOSIS — C50919 Malignant neoplasm of unspecified site of unspecified female breast: Secondary | ICD-10-CM

## 2012-09-20 DIAGNOSIS — Z5111 Encounter for antineoplastic chemotherapy: Secondary | ICD-10-CM

## 2012-09-20 DIAGNOSIS — C773 Secondary and unspecified malignant neoplasm of axilla and upper limb lymph nodes: Secondary | ICD-10-CM

## 2012-09-20 DIAGNOSIS — C50411 Malignant neoplasm of upper-outer quadrant of right female breast: Secondary | ICD-10-CM

## 2012-09-20 DIAGNOSIS — Z171 Estrogen receptor negative status [ER-]: Secondary | ICD-10-CM

## 2012-09-20 DIAGNOSIS — C50911 Malignant neoplasm of unspecified site of right female breast: Secondary | ICD-10-CM

## 2012-09-20 LAB — COMPREHENSIVE METABOLIC PANEL (CC13)
AST: 32 U/L (ref 5–34)
Alkaline Phosphatase: 117 U/L (ref 40–150)
BUN: 8.9 mg/dL (ref 7.0–26.0)
Calcium: 8.8 mg/dL (ref 8.4–10.4)
Creatinine: 0.6 mg/dL (ref 0.6–1.1)

## 2012-09-20 LAB — CBC WITH DIFFERENTIAL/PLATELET
BASO%: 0 % (ref 0.0–2.0)
EOS%: 0.2 % (ref 0.0–7.0)
HCT: 28.5 % — ABNORMAL LOW (ref 34.8–46.6)
LYMPH%: 25.8 % (ref 14.0–49.7)
MCH: 36.5 pg — ABNORMAL HIGH (ref 25.1–34.0)
MCHC: 32.3 g/dL (ref 31.5–36.0)
MCV: 113.1 fL — ABNORMAL HIGH (ref 79.5–101.0)
MONO%: 10.7 % (ref 0.0–14.0)
NEUT%: 63.3 % (ref 38.4–76.8)
Platelets: 164 10*3/uL (ref 145–400)

## 2012-09-20 MED ORDER — ONDANSETRON 8 MG/50ML IVPB (CHCC)
8.0000 mg | Freq: Once | INTRAVENOUS | Status: AC
  Administered 2012-09-20: 8 mg via INTRAVENOUS

## 2012-09-20 MED ORDER — SODIUM CHLORIDE 0.9 % IV SOLN
800.0000 mg/m2 | Freq: Once | INTRAVENOUS | Status: AC
  Administered 2012-09-20: 1520 mg via INTRAVENOUS
  Filled 2012-09-20: qty 40.02

## 2012-09-20 MED ORDER — HEPARIN SOD (PORK) LOCK FLUSH 100 UNIT/ML IV SOLN
500.0000 [IU] | Freq: Once | INTRAVENOUS | Status: AC | PRN
  Administered 2012-09-20: 500 [IU]
  Filled 2012-09-20: qty 5

## 2012-09-20 MED ORDER — SODIUM CHLORIDE 0.9 % IV SOLN
726.5000 mg | Freq: Once | INTRAVENOUS | Status: AC
  Administered 2012-09-20: 730 mg via INTRAVENOUS
  Filled 2012-09-20: qty 73

## 2012-09-20 MED ORDER — SODIUM CHLORIDE 0.9 % IJ SOLN
10.0000 mL | INTRAMUSCULAR | Status: DC | PRN
  Administered 2012-09-20: 10 mL
  Filled 2012-09-20: qty 10

## 2012-09-20 MED ORDER — DEXAMETHASONE SODIUM PHOSPHATE 10 MG/ML IJ SOLN
10.0000 mg | Freq: Once | INTRAMUSCULAR | Status: AC
  Administered 2012-09-20: 10 mg via INTRAVENOUS

## 2012-09-20 MED ORDER — SODIUM CHLORIDE 0.9 % IV SOLN
Freq: Once | INTRAVENOUS | Status: AC
  Administered 2012-09-20: 13:00:00 via INTRAVENOUS

## 2012-09-20 NOTE — Progress Notes (Signed)
OFFICE PROGRESS NOTE  CC  [name], MD [address] [place] [address] Dr. Fatima Sanger Dr. 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 Diagnosed November 2013. Clinical stage II (T2 N1)  PRIOR THERAPY:  #1 patient originally presented to the multidisciplinary breast clinic on 04/10/2012. She originally palpated a right breast mass. She had a mammogram performed that showed dense breasts bilaterally. Ultrasound of the right breast showed 2.3 x 1.9 cm area of abnormality with multiple abnormal lymph nodes. MRI of the bilateral breasts showed asymmetrical enhancement throughout the right breast consistent with multicentric disease. An ultrasound-guided biopsy performed. The known area of disease measured 1.9 x 1.9 x 2.3 cm. Multiple abnormal positive right axillary lymph nodes were noted. 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 Biopsy of the right axillary lymph node was positive for malignancy with extracapsular extension. Patient was originally seen by Dr. Jamey Ripa Dr. Donnie Coffin and Dr. Michell Heinrich. She has elected to have a right mastectomy eventually and declined biopsies of any other areas within the breast.  #2 after extensive discussion patient initially was started on neoadjuvant chemotherapy by Dr. Donnie Coffin. He began her on neoadjuvant FEC given dose dense every 2 weeks with day 2 Neulasta. For 4 cycles.Followed by dose dense Taxotere every 2 weeks for a total of 4 cycles. She began her chemotherapy on 04/26/2012.  She will start Taxotere on 06/21/12.  She unfortunately had a bad hand-foot reaction and this was discontinued.  She will proceed with Gemzar/Carbo every 2 weeks.    CURRENT THERAPY: Gemzar/Carbo Cycle 5 day 1   INTERVAL HISTORY: Nicole Valentine 47 y.o.  female returns for followup today prior to chemotherapy. Patient was to be scheduled for surgery however due to scheduling issues and patient's desire to have a consultation with plastics she will proceed with cycle 5 of Gemzar carboplatinum today. Patient feels well without any other complaints. She is accompanied by her husband today. Remainder of the 10 point review of systems is negative. MEDICAL HISTORY: Past Medical History  Diagnosis Date  . Thyroid disease   . Hypothyroidism   . Contact lens/glasses fitting     wears contacts or glasses  . Complication of anesthesia     her father and uncle both had very difficult time waking up-was  something they told them may be hereditory. she will find out.  . Breast cancer dx'd 04-10-12-rt    ALLERGIES:  is allergic to almond meal.  MEDICATIONS:  Current Outpatient Prescriptions  Medication Sig Dispense Refill  . calcium carbonate (OS-CAL - DOSED IN MG OF ELEMENTAL CALCIUM) 1250 MG tablet Take 1 tablet by mouth daily.      . Cholecalciferol (VITAMIN D-3 PO) Take 2,000 Int'l Units by mouth.      . Cyanocobalamin (VITAMIN B-12 PO) Take 5,000 mg by mouth daily.      [name] dexamethasone (DECADRON) 4 MG tablet Take 2 tablets by mouth once a day on the day after chemotherapy and then take 2 tablets two times a day for 2 days. Take with food.  30 tablet  3  . Ferrous Sulfate Dried (SLOW RELEASE IRON) 45 MG TBCR Take 65 mg by mouth daily.       [name] levothyroxine (SYNTHROID, LEVOTHROID) 200 MCG tablet 150 mcg.       [name]  lidocaine-prilocaine (EMLA) cream Apply topically as needed.  30 g  0  . LORazepam (ATIVAN) 0.5 MG tablet Take 1 tablet (0.5 mg total) by mouth every 6 (six) hours as needed.  30 tablet  0  . Alum & Mag Hydroxide-Simeth (MAGIC MOUTHWASH) SOLN Take 5 mLs by mouth 3 (three) times daily.  100 mL  1   No current facility-administered medications for this visit.    SURGICAL HISTORY:  Past Surgical History  Procedure Laterality Date  . Wisdom  tooth extraction    . Portacath placement  04/22/2012    Procedure: INSERTION PORT-A-CATH;  Surgeon: Currie Paris, MD;  Location: [facility];  Service: General;  Laterality: Left;    REVIEW OF SYSTEMS:  General: fatigue (+), night sweats (-), fever (-), pain (-) Lymph: palpable nodes (-) HEENT: vision changes (-), mucositis (-), gum bleeding (-), epistaxis (-) Cardiovascular: chest pain (-), palpitations (-) Pulmonary: shortness of breath (-), dyspnea on exertion (-), cough (-), hemoptysis (-) GI:  Early satiety (-), melena (-), dysphagia (-), nausea/vomiting (-), diarrhea (-) GU: dysuria (-), hematuria (-), incontinence (-) Musculoskeletal: joint swelling (-), joint pain (-), back pain (-) Neuro: weakness (-), numbness (-), headache (-), confusion (-) Skin: Rash (-), lesions (-), dryness (-) Psych: depression (-), suicidal/homicidal ideation (-), feeling of hopelessness (-)    PHYSICAL EXAMINATION: Blood pressure 124/77, pulse 78, temperature 98.1 F (36.7 C), temperature source Oral, resp. rate 20, height 5' 7.5" (1.715 m), weight 180 lb 3.2 oz (81.738 kg). Body mass index is 27.79 kg/(m^2). General: Patient is a well appearing female in no acute distress HEENT: PERRLA, sclerae anicteric no conjunctival pallor, MMM Neck: supple, no palpable adenopathy Lungs: clear to auscultation bilaterally, no wheezes, rhonchi, or rales Cardiovascular: regular rate rhythm, S1, S2, no murmurs, rubs or gallops Abdomen: Soft, non-tender, non-distended, normoactive bowel sounds, no HSM Extremities: warm and well perfused, no clubbing, cyanosis, or edema Skin: No rashes or lesions, fingernails are beginning to separate at the edges, no erythema, or exudate from cuticles Neuro: Non-focal ECOG PERFORMANCE STATUS: 0 - Asymptomatic Right breast still does reveal a palpable mass, much softer no axillary masses are noted left breast no masses or nipple discharge, right axilla with 1cm  non-tender enlarged node (slightly smaller), no redness or warmth  LABORATORY DATA: Lab Results  Component Value Date   WBC 4.2 09/20/2012   HGB 9.2* 09/20/2012   HCT 28.5* 09/20/2012   MCV 113.1* 09/20/2012   PLT 164 09/20/2012      Chemistry      Component Value Date/Time   NA 140 08/16/2012 0854   K 3.9 08/16/2012 0854   CL 104 08/16/2012 0854   CO2 27 08/16/2012 0854   BUN 15.2 08/16/2012 0854   CREATININE 0.7 08/16/2012 0854      Component Value Date/Time   CALCIUM 9.3 08/16/2012 0854   ALKPHOS 126 08/16/2012 0854   AST 27 08/16/2012 0854   ALT 48 08/16/2012 0854   BILITOT 1.01 08/16/2012 0854       RADIOGRAPHIC STUDIES:  No results found.  ASSESSMENT: 47 year old female with  #1 high risk stage IIB invasive ductal carcinomaOf the right breast. The tumor is triple negative with a Ki-67 of 25%. She is currently receiving neoadjuvant chemotherapy consisting of FEC being given dose dense.Thus far she is tolerating it well although she does become quite neutropenic.  #2 once patient completes her FEC for 4 cycles she will proceed with dose dense Taxotere for a total  of 4 cycles.  Thsi was changed to Gemzar Carbo after hand-foot reaction to taxotere.    #3 upon completion of her chemotherapy she will proceed with mastectomy and axillary lymph node dissection of the right breast and axilla. Post mastectomy patient will need radiation therapy likely since she did have multifocal disease. But we will leave that to the radiation oncologist  #4 patient had CT of the chest performed to evaluate the right axillary lymph node were discussed at results in detail today.   PLAN:  #1proceed with cycle 5 day 1 of Gemzar carboplatinum.  #2 she will return in one week's time for followup.  All questions were answered. The patient knows to call the clinic with any problems, questions or concerns. We can certainly see the patient much sooner if necessary.  I spent 25 minutes counseling the patient  face to face. The total time spent in the appointment was 30 minutes.  Drue Second, MD Medical/Oncology [facility] [phone] (beeper) [phone] (Office)

## 2012-09-20 NOTE — Patient Instructions (Addendum)
Proceed with chemotherapy today  Neulasta on 09/21/12  I will see you back on 5/6

## 2012-09-20 NOTE — Telephone Encounter (Signed)
, °

## 2012-09-20 NOTE — Patient Instructions (Addendum)
[facility] Discharge Instructions for Patients Receiving Chemotherapy  Today you received the following chemotherapy agents Gemzar and Carboplatin.  To help prevent nausea and vomiting after your treatment, we encourage you to take your nausea medication.   If you develop nausea and vomiting that is not controlled by your nausea medication, call the clinic. If it is after clinic hours your family physician or the after hours number for the clinic or go to the Emergency Department.   BELOW ARE SYMPTOMS THAT SHOULD BE REPORTED IMMEDIATELY:  *FEVER GREATER THAN 100.5 F  *CHILLS WITH OR WITHOUT FEVER  NAUSEA AND VOMITING THAT IS NOT CONTROLLED WITH YOUR NAUSEA MEDICATION  *UNUSUAL SHORTNESS OF BREATH  *UNUSUAL BRUISING OR BLEEDING  TENDERNESS IN MOUTH AND THROAT WITH OR WITHOUT PRESENCE OF ULCERS  *URINARY PROBLEMS  *BOWEL PROBLEMS  UNUSUAL RASH Items with * indicate a potential emergency and should be followed up as soon as possible.  One of the nurses will contact you 24 hours after your treatment. Please let the nurse know about any problems that you may have experienced. Feel free to call the clinic you have any questions or concerns. The clinic phone number is [phone].   I have been informed and understand all the instructions given to me. I know to contact the clinic, my physician, or go to the Emergency Department if any problems should occur. I do not have any questions at this time, but understand that I may call the clinic during office hours   should I have any questions or need assistance in obtaining follow up care.    __________________________________________  _____________  __________ Signature of Patient or Authorized Representative            Date                   Time    __________________________________________ Nurse's Signature

## 2012-09-21 ENCOUNTER — Ambulatory Visit (HOSPITAL_BASED_OUTPATIENT_CLINIC_OR_DEPARTMENT_OTHER): Payer: BC Managed Care – PPO

## 2012-09-21 VITALS — BP 119/78 | HR 84 | Temp 97.8°F

## 2012-09-21 DIAGNOSIS — C773 Secondary and unspecified malignant neoplasm of axilla and upper limb lymph nodes: Secondary | ICD-10-CM

## 2012-09-21 DIAGNOSIS — C50419 Malignant neoplasm of upper-outer quadrant of unspecified female breast: Secondary | ICD-10-CM

## 2012-09-21 DIAGNOSIS — Z5189 Encounter for other specified aftercare: Secondary | ICD-10-CM

## 2012-09-21 MED ORDER — PEGFILGRASTIM INJECTION 6 MG/0.6ML
6.0000 mg | Freq: Once | SUBCUTANEOUS | Status: AC
  Administered 2012-09-21: 6 mg via SUBCUTANEOUS

## 2012-09-23 NOTE — Progress Notes (Signed)
OFFICE PROGRESS NOTE  CC  Nicole Found, MD [address] [place] [address] Dr. Fatima Sanger Dr. 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 Diagnosed November 2013. Clinical stage II (T2 N1)  PRIOR THERAPY:  #1 patient originally presented to the multidisciplinary breast clinic on 04/10/2012. She originally palpated a right breast mass. She had a mammogram performed that showed dense breasts bilaterally. Ultrasound of the right breast showed 2.3 x 1.9 cm area of abnormality with multiple abnormal lymph nodes. MRI of the bilateral breasts showed asymmetrical enhancement throughout the right breast consistent with multicentric disease. An ultrasound-guided biopsy performed. The known area of disease measured 1.9 x 1.9 x 2.3 cm. Multiple abnormal positive right axillary lymph nodes were noted. 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 Biopsy of the right axillary lymph node was positive for malignancy with extracapsular extension. Patient was originally seen by Dr. Jamey Ripa Dr. Donnie Coffin and Dr. Michell Heinrich. She has elected to have a right mastectomy eventually and declined biopsies of any other areas within the breast.  #2 after extensive discussion patient initially was started on neoadjuvant chemotherapy by Dr. Donnie Coffin. He began her on neoadjuvant FEC given dose dense every 2 weeks with day 2 Neulasta. For 4 cycles.Followed by dose dense Taxotere every 2 weeks for a total of 4 cycles. She began her chemotherapy on 04/26/2012.  She will start Taxotere on 06/21/12.  She unfortunately had a bad hand-foot reaction and this was discontinued.  She will proceed with Gemzar/Carbo every 2 weeks.    CURRENT THERAPY: for Gemzar/Carbo Cycle 4 day 1 on 4/8  INTERVAL HISTORY: Nicole Valentine 47  y.o. female returns for evaluation.  Overall she's doing much better her platelets have improved. She is denying any nausea vomiting fevers chills night sweats headaches she has no shortness of breath no chest pains palpitations or discuss CT scan results. She is pleased. She does want to continue with her chemotherapy. Remainder of the 10 point review of systems is negative.   MEDICAL HISTORY: Past Medical History  Diagnosis Date  . Thyroid disease   . Hypothyroidism   . Contact lens/glasses fitting     wears contacts or glasses  . Complication of anesthesia     her father and uncle both had very difficult time waking up-was  something they told them may be hereditory. she will find out.  . Breast cancer dx'd 04-10-12-rt    ALLERGIES:  is allergic to almond meal.  MEDICATIONS:  Current Outpatient Prescriptions  Medication Sig Dispense Refill  . calcium carbonate (OS-CAL - DOSED IN MG OF ELEMENTAL CALCIUM) 1250 MG tablet Take 1 tablet by mouth daily.      . Cholecalciferol (VITAMIN D-3 PO) Take 2,000 Int'l Units by mouth.      . Cyanocobalamin (VITAMIN B-12 PO) Take 5,000 mg by mouth daily.      . Ferrous Sulfate Dried (SLOW RELEASE IRON) 45 MG TBCR Take 65 mg by mouth daily.       [name] levothyroxine (SYNTHROID, LEVOTHROID) 200 MCG tablet 150 mcg.       [name] Alum & Mag Hydroxide-Simeth (MAGIC MOUTHWASH) SOLN Take 5 mLs by mouth 3 (three) times daily.  100 mL  1  . dexamethasone (DECADRON) 4 MG tablet Take 2 tablets by mouth once a day on the  day after chemotherapy and then take 2 tablets two times a day for 2 days. Take with food.  30 tablet  3  . lidocaine-prilocaine (EMLA) cream Apply topically as needed.  30 g  0  . LORazepam (ATIVAN) 0.5 MG tablet Take 1 tablet (0.5 mg total) by mouth every 6 (six) hours as needed.  30 tablet  0   No current facility-administered medications for this visit.    SURGICAL HISTORY:  Past Surgical History  Procedure Laterality Date  . Wisdom tooth  extraction    . Portacath placement  04/22/2012    Procedure: INSERTION PORT-A-CATH;  Surgeon: Currie Paris, MD;  Location: [facility];  Service: General;  Laterality: Left;    REVIEW OF SYSTEMS:  General: fatigue (+), night sweats (-), fever (-), pain (-) Lymph: palpable nodes (-) HEENT: vision changes (-), mucositis (-), gum bleeding (-), epistaxis (-) Cardiovascular: chest pain (-), palpitations (-) Pulmonary: shortness of breath (-), dyspnea on exertion (-), cough (-), hemoptysis (-) GI:  Early satiety (-), melena (-), dysphagia (-), nausea/vomiting (-), diarrhea (-) GU: dysuria (-), hematuria (-), incontinence (-) Musculoskeletal: joint swelling (-), joint pain (-), back pain (-) Neuro: weakness (-), numbness (-), headache (-), confusion (-) Skin: Rash (-), lesions (-), dryness (-) Psych: depression (-), suicidal/homicidal ideation (-), feeling of hopelessness (-)    PHYSICAL EXAMINATION: Blood pressure 128/74, pulse 102, temperature 98.2 F (36.8 C), temperature source Oral, resp. rate 20, height 5\' 7"  (1.702 m), weight 180 lb (81.647 kg). Body mass index is 28.19 kg/(m^2). General: Patient is a well appearing female in no acute distress HEENT: PERRLA, sclerae anicteric no conjunctival pallor, MMM Neck: supple, no palpable adenopathy Lungs: clear to auscultation bilaterally, no wheezes, rhonchi, or rales Cardiovascular: regular rate rhythm, S1, S2, no murmurs, rubs or gallops Abdomen: Soft, non-tender, non-distended, normoactive bowel sounds, no HSM Extremities: warm and well perfused, no clubbing, cyanosis, or edema Skin: No rashes or lesions, fingernails are beginning to separate at the edges, no erythema, or exudate from cuticles Neuro: Non-focal ECOG PERFORMANCE STATUS: 0 - Asymptomatic Right breast still does reveal a palpable mass, much softer no axillary masses are noted left breast no masses or nipple discharge, right axilla with 1cm non-tender  enlarged node (slightly smaller), no redness or warmth  LABORATORY DATA: Lab Results  Component Value Date   WBC 4.2 09/20/2012   HGB 9.2* 09/20/2012   HCT 28.5* 09/20/2012   MCV 113.1* 09/20/2012   PLT 164 09/20/2012      Chemistry      Component Value Date/Time   NA 142 09/20/2012 1039   K 3.6 09/20/2012 1039   CL 105 09/20/2012 1039   CO2 28 09/20/2012 1039   BUN 8.9 09/20/2012 1039   CREATININE 0.6 09/20/2012 1039      Component Value Date/Time   CALCIUM 8.8 09/20/2012 1039   ALKPHOS 117 09/20/2012 1039   AST 32 09/20/2012 1039   ALT 41 09/20/2012 1039   BILITOT 0.74 09/20/2012 1039       RADIOGRAPHIC STUDIES:  No results Valentine.  ASSESSMENT: 47 year old female with  #1 high risk stage IIB invasive ductal carcinomaOf the right breast. The tumor is triple negative with a Ki-67 of 25%. She is currently receiving neoadjuvant chemotherapy consisting of FEC being given dose dense.Thus far she is tolerating it well although she does become quite neutropenic.  #2 once patient completes her FEC for 4 cycles she will proceed with dose dense Taxotere for a total  of 4 cycles.  Thsi was changed to Gemzar Carbo after hand-foot reaction to taxotere.    #3 upon completion of her chemotherapy she will proceed with mastectomy and axillary lymph node dissection of the right breast and axilla. Post mastectomy patient will need radiation therapy likely since she did have multifocal disease. But we will leave that to the radiation oncologist  #4 patient had CT of the chest performed to evaluate the right axillary lymph node were discussed at results in detail today.   PLAN:  #1 patient will proceed with chemotherapy on 08/30/2012. We will check a CBC prior to that.  #2 she will return in 411 for followup.  All questions were answered. The patient knows to call the clinic with any problems, questions or concerns. We can certainly see the patient much sooner if necessary.  I spent 25 minutes counseling the patient  face to face. The total time spent in the appointment was 30 minutes.  Drue Second, MD Medical/Oncology [facility] [phone] (beeper) [phone] (Office)

## 2012-09-27 ENCOUNTER — Other Ambulatory Visit: Payer: BC Managed Care – PPO | Admitting: Lab

## 2012-09-27 ENCOUNTER — Ambulatory Visit: Payer: BC Managed Care – PPO | Admitting: Adult Health

## 2012-09-27 ENCOUNTER — Ambulatory Visit (HOSPITAL_BASED_OUTPATIENT_CLINIC_OR_DEPARTMENT_OTHER): Payer: BC Managed Care – PPO | Admitting: Oncology

## 2012-09-27 ENCOUNTER — Other Ambulatory Visit (HOSPITAL_BASED_OUTPATIENT_CLINIC_OR_DEPARTMENT_OTHER): Payer: BC Managed Care – PPO | Admitting: Lab

## 2012-09-27 VITALS — BP 132/75 | HR 80 | Temp 98.2°F | Resp 20 | Ht 67.5 in | Wt 178.5 lb

## 2012-09-27 DIAGNOSIS — C50411 Malignant neoplasm of upper-outer quadrant of right female breast: Secondary | ICD-10-CM

## 2012-09-27 DIAGNOSIS — C50911 Malignant neoplasm of unspecified site of right female breast: Secondary | ICD-10-CM

## 2012-09-27 DIAGNOSIS — C773 Secondary and unspecified malignant neoplasm of axilla and upper limb lymph nodes: Secondary | ICD-10-CM

## 2012-09-27 DIAGNOSIS — C50419 Malignant neoplasm of upper-outer quadrant of unspecified female breast: Secondary | ICD-10-CM

## 2012-09-27 DIAGNOSIS — Z171 Estrogen receptor negative status [ER-]: Secondary | ICD-10-CM

## 2012-09-27 LAB — CBC WITH DIFFERENTIAL/PLATELET
Basophils Absolute: 0 10*3/uL (ref 0.0–0.1)
EOS%: 0.1 % (ref 0.0–7.0)
Eosinophils Absolute: 0 10*3/uL (ref 0.0–0.5)
HCT: 27.7 % — ABNORMAL LOW (ref 34.8–46.6)
HGB: 9.6 g/dL — ABNORMAL LOW (ref 11.6–15.9)
MCH: 39.3 pg — ABNORMAL HIGH (ref 25.1–34.0)
MCV: 113.8 fL — ABNORMAL HIGH (ref 79.5–101.0)
NEUT#: 7.1 10*3/uL — ABNORMAL HIGH (ref 1.5–6.5)
NEUT%: 80.3 % — ABNORMAL HIGH (ref 38.4–76.8)
lymph#: 1.2 10*3/uL (ref 0.9–3.3)

## 2012-09-27 LAB — COMPREHENSIVE METABOLIC PANEL (CC13)
AST: 14 U/L (ref 5–34)
Albumin: 3.5 g/dL (ref 3.5–5.0)
BUN: 15.2 mg/dL (ref 7.0–26.0)
Calcium: 9 mg/dL (ref 8.4–10.4)
Chloride: 100 mEq/L (ref 98–107)
Creatinine: 0.8 mg/dL (ref 0.6–1.1)
Glucose: 137 mg/dl — ABNORMAL HIGH (ref 70–99)

## 2012-09-27 MED ORDER — DEXAMETHASONE 4 MG PO TABS: ORAL_TABLET | ORAL | Status: DC

## 2012-09-30 NOTE — Progress Notes (Signed)
OFFICE PROGRESS NOTE  CC  Nicole Found, MD [address] [place] [address] Dr. Fatima Sanger Dr. 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 Diagnosed November 2013. Clinical stage II (T2 N1)  PRIOR THERAPY:  #1 patient originally presented to the multidisciplinary breast clinic on 04/10/2012. She originally palpated a right breast mass. She had a mammogram performed that showed dense breasts bilaterally. Ultrasound of the right breast showed 2.3 x 1.9 cm area of abnormality with multiple abnormal lymph nodes. MRI of the bilateral breasts showed asymmetrical enhancement throughout the right breast consistent with multicentric disease. An ultrasound-guided biopsy performed. The known area of disease measured 1.9 x 1.9 x 2.3 cm. Multiple abnormal positive right axillary lymph nodes were noted. 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 Biopsy of the right axillary lymph node was positive for malignancy with extracapsular extension. Patient was originally seen by Dr. Jamey Ripa Dr. Donnie Coffin and Dr. Michell Heinrich. She has elected to have a right mastectomy eventually and declined biopsies of any other areas within the breast.  #2 after extensive discussion patient initially was started on neoadjuvant chemotherapy by Dr. Donnie Coffin. He began her on neoadjuvant FEC given dose dense every 2 weeks with day 2 Neulasta. For 4 cycles.Followed by dose dense Taxotere every 2 weeks for a total of 4 cycles. She began her chemotherapy on 04/26/2012.  She will start Taxotere on 06/21/12.  She unfortunately had a bad hand-foot reaction and this was discontinued.  She will proceed with Gemzar/Carbo every 2 weeks.    CURRENT THERAPY: Gemzar/Carbo Cycle 5 day 8  INTERVAL HISTORY: Nicole Valentine 47 y.o.  female returns for followup today overall she tolerated cycle 5 of chemotherapy well without any significant problems she has a little bit of fatigue but no nausea or vomiting no fevers chills night sweats no bleeding or easy bruising. Remainder of the 10 point review of systems is negative.Remainder of the 10 point review of systems is negative.  MEDICAL HISTORY: Past Medical History  Diagnosis Date  . Thyroid disease   . Hypothyroidism   . Contact lens/glasses fitting     wears contacts or glasses  . Complication of anesthesia     her father and uncle both had very difficult time waking up-was  something they told them may be hereditory. she will find out.  . Breast cancer dx'd 04-10-12-rt    ALLERGIES:  is allergic to almond meal.  MEDICATIONS:  Current Outpatient Prescriptions  Medication Sig Dispense Refill  . calcium carbonate (OS-CAL - DOSED IN MG OF ELEMENTAL CALCIUM) 1250 MG tablet Take 1 tablet by mouth daily.      . Cholecalciferol (VITAMIN D-3 PO) Take 2,000 Int'l Units by mouth.      . Cyanocobalamin (VITAMIN B-12 PO) Take 5,000 mg by mouth daily.      [name] dexamethasone (DECADRON) 4 MG tablet Take 2 tablets by mouth once a day on the day after chemotherapy and then take 2 tablets two times a day for 2 days. Take with food.  30 tablet  3  . Ferrous Sulfate Dried (SLOW RELEASE IRON) 45 MG TBCR Take 65 mg by mouth daily.       [name] levothyroxine (SYNTHROID, LEVOTHROID) 200 MCG tablet 150 mcg.       [name] LORazepam (ATIVAN) 0.5 MG tablet  Take 1 tablet (0.5 mg total) by mouth every 6 (six) hours as needed.  30 tablet  0  . Alum & Mag Hydroxide-Simeth (MAGIC MOUTHWASH) SOLN Take 5 mLs by mouth 3 (three) times daily.  100 mL  1  . lidocaine-prilocaine (EMLA) cream Apply topically as needed.  30 g  0   No current facility-administered medications for this visit.    SURGICAL HISTORY:  Past Surgical History  Procedure Laterality Date  . Wisdom tooth extraction    . Portacath placement   04/22/2012    Procedure: INSERTION PORT-A-CATH;  Surgeon: Currie Paris, MD;  Location: [facility];  Service: General;  Laterality: Left;    REVIEW OF SYSTEMS:  General: fatigue (+), night sweats (-), fever (-), pain (-) Lymph: palpable nodes (-) HEENT: vision changes (-), mucositis (-), gum bleeding (-), epistaxis (-) Cardiovascular: chest pain (-), palpitations (-) Pulmonary: shortness of breath (-), dyspnea on exertion (-), cough (-), hemoptysis (-) GI:  Early satiety (-), melena (-), dysphagia (-), nausea/vomiting (-), diarrhea (-) GU: dysuria (-), hematuria (-), incontinence (-) Musculoskeletal: joint swelling (-), joint pain (-), back pain (-) Neuro: weakness (-), numbness (-), headache (-), confusion (-) Skin: Rash (-), lesions (-), dryness (-) Psych: depression (-), suicidal/homicidal ideation (-), feeling of hopelessness (-)    PHYSICAL EXAMINATION: Blood pressure 132/75, pulse 80, temperature 98.2 F (36.8 C), temperature source Oral, resp. rate 20, height 5' 7.5" (1.715 m), weight 178 lb 8 oz (80.967 kg). Body mass index is 27.53 kg/(m^2). General: Patient is a well appearing female in no acute distress HEENT: PERRLA, sclerae anicteric no conjunctival pallor, MMM Neck: supple, no palpable adenopathy Lungs: clear to auscultation bilaterally, no wheezes, rhonchi, or rales Cardiovascular: regular rate rhythm, S1, S2, no murmurs, rubs or gallops Abdomen: Soft, non-tender, non-distended, normoactive bowel sounds, no HSM Extremities: warm and well perfused, no clubbing, cyanosis, or edema Skin: No rashes or lesions, fingernails are beginning to separate at the edges, no erythema, or exudate from cuticles Neuro: Non-focal ECOG PERFORMANCE STATUS: 0 - Asymptomatic Right breast still does reveal a palpable mass, much softer no axillary masses are noted left breast no masses or nipple discharge, right axilla with 1cm non-tender enlarged node (slightly smaller), no  redness or warmth  LABORATORY DATA: Lab Results  Component Value Date   WBC 8.8 09/27/2012   HGB 9.6* 09/27/2012   HCT 27.7* 09/27/2012   MCV 113.8* 09/27/2012   PLT 138* 09/27/2012      Chemistry      Component Value Date/Time   NA 141 09/27/2012 1129   K 4.6 09/27/2012 1129   CL 100 09/27/2012 1129   CO2 31* 09/27/2012 1129   BUN 15.2 09/27/2012 1129   CREATININE 0.8 09/27/2012 1129      Component Value Date/Time   CALCIUM 9.0 09/27/2012 1129   ALKPHOS 139 09/27/2012 1129   AST 14 09/27/2012 1129   ALT 33 09/27/2012 1129   BILITOT 0.62 09/27/2012 1129       RADIOGRAPHIC STUDIES:  No results Valentine.  ASSESSMENT: 46 year old female with  #1 high risk stage IIB invasive ductal carcinomaOf the right breast. The tumor is triple negative with a Ki-67 of 25%. She is currently receiving neoadjuvant chemotherapy consisting of FEC being given dose dense.Thus far she is tolerating it well although she does become quite neutropenic.  #2 once patient completes her FEC for 4 cycles she will proceed with dose dense Taxotere for a total of 4 cycles.  Thsi  was changed to Gemzar Carbo after hand-foot reaction to taxotere.    #3 upon completion of her chemotherapy she will proceed with mastectomy and axillary lymph node dissection of the right breast and axilla. Post mastectomy patient will need radiation therapy likely since she did have multifocal disease. But we will leave that to the radiation oncologist  #4 patient had CT of the chest performed to evaluate the right axillary lymph node were discussed at results in detail today.   PLAN:  #1 patient's blood count looks good.  #2 she will return in one week's time for cycle #6 of Gemzar carboplatinum.  All questions were answered. The patient knows to call the clinic with any problems, questions or concerns. We can certainly see the patient much sooner if necessary.  I spent 25 minutes counseling the patient face to face. The total time spent in the  appointment was 30 minutes.  Drue Second, MD Medical/Oncology [facility] [phone] (beeper) [phone] (Office)

## 2012-10-04 ENCOUNTER — Encounter: Payer: Self-pay | Admitting: Oncology

## 2012-10-04 ENCOUNTER — Other Ambulatory Visit (HOSPITAL_BASED_OUTPATIENT_CLINIC_OR_DEPARTMENT_OTHER): Payer: BC Managed Care – PPO | Admitting: Lab

## 2012-10-04 ENCOUNTER — Other Ambulatory Visit: Payer: Self-pay | Admitting: Emergency Medicine

## 2012-10-04 ENCOUNTER — Ambulatory Visit (HOSPITAL_BASED_OUTPATIENT_CLINIC_OR_DEPARTMENT_OTHER): Payer: BC Managed Care – PPO | Admitting: Oncology

## 2012-10-04 ENCOUNTER — Ambulatory Visit: Payer: BC Managed Care – PPO

## 2012-10-04 VITALS — BP 118/72 | HR 87 | Temp 98.5°F | Resp 20 | Ht 67.5 in | Wt 180.2 lb

## 2012-10-04 DIAGNOSIS — C50411 Malignant neoplasm of upper-outer quadrant of right female breast: Secondary | ICD-10-CM

## 2012-10-04 DIAGNOSIS — C50419 Malignant neoplasm of upper-outer quadrant of unspecified female breast: Secondary | ICD-10-CM

## 2012-10-04 DIAGNOSIS — C50919 Malignant neoplasm of unspecified site of unspecified female breast: Secondary | ICD-10-CM

## 2012-10-04 LAB — CBC WITH DIFFERENTIAL/PLATELET
Basophils Absolute: 0 10*3/uL (ref 0.0–0.1)
Eosinophils Absolute: 0 10*3/uL (ref 0.0–0.5)
HCT: 28.9 % — ABNORMAL LOW (ref 34.8–46.6)
HGB: 9.6 g/dL — ABNORMAL LOW (ref 11.6–15.9)
MCH: 37.6 pg — ABNORMAL HIGH (ref 25.1–34.0)
MONO#: 0.7 10*3/uL (ref 0.1–0.9)
NEUT#: 4.7 10*3/uL (ref 1.5–6.5)
NEUT%: 68.4 % (ref 38.4–76.8)
RDW: 17.8 % — ABNORMAL HIGH (ref 11.2–14.5)
WBC: 6.9 10*3/uL (ref 3.9–10.3)
lymph#: 1.5 10*3/uL (ref 0.9–3.3)

## 2012-10-04 MED ORDER — LORAZEPAM 0.5 MG PO TABS: 0.5000 mg | ORAL_TABLET | Freq: Four times a day (QID) | ORAL | Status: DC | PRN

## 2012-10-04 NOTE — Progress Notes (Signed)
OFFICE PROGRESS NOTE  CC  [name], MD [address] [place] [address] Dr. Fatima Sanger Dr. 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 Diagnosed November 2013. Clinical stage II (T2 N1)  PRIOR THERAPY:  #1 patient originally presented to the multidisciplinary breast clinic on 04/10/2012. She originally palpated a right breast mass. She had a mammogram performed that showed dense breasts bilaterally. Ultrasound of the right breast showed 2.3 x 1.9 cm area of abnormality with multiple abnormal lymph nodes. MRI of the bilateral breasts showed asymmetrical enhancement throughout the right breast consistent with multicentric disease. An ultrasound-guided biopsy performed. The known area of disease measured 1.9 x 1.9 x 2.3 cm. Multiple abnormal positive right axillary lymph nodes were noted. 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 Biopsy of the right axillary lymph node was positive for malignancy with extracapsular extension. Patient was originally seen by Dr. Jamey Ripa Dr. Donnie Coffin and Dr. Michell Heinrich. She has elected to have a right mastectomy eventually and declined biopsies of any other areas within the breast.  #2 after extensive discussion patient initially was started on neoadjuvant chemotherapy by Dr. Donnie Coffin. He began her on neoadjuvant FEC given dose dense every 2 weeks with day 2 Neulasta. For 4 cycles.Followed by dose dense Taxotere every 2 weeks for a total of 4 cycles. She began her chemotherapy on 04/26/2012.  She will start Taxotere on 06/21/12.  She unfortunately had a bad hand-foot reaction and this was discontinued.  She will proceed with Gemzar/Carbo every 2 weeks.    CURRENT THERAPY: return in one week's time for cycle 6 of Gemzar carbo  INTERVAL  HISTORY: Shawnique Mariotti 47 y.o. female returns for followup today overall she tolerated cycle 5 of chemotherapy well without any significant problems she has a little bit of fatigue but no nausea or vomiting no fevers chills night sweats no bleeding or easy bruising. She is now thrombocytopenic and will not be able to get her chemotherapy as scheduled.Remainder of the 10 point review of systems is negative.Remainder of the 10 point review of systems is negative.  MEDICAL HISTORY: Past Medical History  Diagnosis Date  . Thyroid disease   . Hypothyroidism   . Contact lens/glasses fitting     wears contacts or glasses  . Complication of anesthesia     her father and uncle both had very difficult time waking up-was  something they told them may be hereditory. she will find out.  . Breast cancer dx'd 04-10-12-rt    ALLERGIES:  is allergic to almond meal.  MEDICATIONS:  Current Outpatient Prescriptions  Medication Sig Dispense Refill  . calcium carbonate (OS-CAL - DOSED IN MG OF ELEMENTAL CALCIUM) 1250 MG tablet Take 1 tablet by mouth daily.      . Cholecalciferol (VITAMIN D-3 PO) Take 2,000 Int'l Units by mouth.      . Cyanocobalamin (VITAMIN B-12 PO) Take 5,000 mg by mouth daily.      . Ferrous Sulfate Dried (SLOW RELEASE IRON) 45 MG TBCR Take 65 mg by mouth daily.       [name] levothyroxine (SYNTHROID, LEVOTHROID) 200 MCG tablet 150 mcg.       . lidocaine-prilocaine (EMLA) cream Apply topically as needed.  30 g  0  . Alum & Mag Hydroxide-Simeth (MAGIC MOUTHWASH) SOLN Take 5 mLs by  mouth 3 (three) times daily.  100 mL  1  . dexamethasone (DECADRON) 4 MG tablet Take 2 tablets by mouth once a day on the day after chemotherapy and then take 2 tablets two times a day for 2 days. Take with food.  30 tablet  3  . LORazepam (ATIVAN) 0.5 MG tablet Take 1 tablet (0.5 mg total) by mouth every 6 (six) hours as needed.  30 tablet  0   No current facility-administered medications for this visit.     SURGICAL HISTORY:  Past Surgical History  Procedure Laterality Date  . Wisdom tooth extraction    . Portacath placement  04/22/2012    Procedure: INSERTION PORT-A-CATH;  Surgeon: Currie Paris, MD;  Location: [facility];  Service: General;  Laterality: Left;    REVIEW OF SYSTEMS:  General: fatigue (+), night sweats (-), fever (-), pain (-) Lymph: palpable nodes (-) HEENT: vision changes (-), mucositis (-), gum bleeding (-), epistaxis (-) Cardiovascular: chest pain (-), palpitations (-) Pulmonary: shortness of breath (-), dyspnea on exertion (-), cough (-), hemoptysis (-) GI:  Early satiety (-), melena (-), dysphagia (-), nausea/vomiting (-), diarrhea (-) GU: dysuria (-), hematuria (-), incontinence (-) Musculoskeletal: joint swelling (-), joint pain (-), back pain (-) Neuro: weakness (-), numbness (-), headache (-), confusion (-) Skin: Rash (-), lesions (-), dryness (-) Psych: depression (-), suicidal/homicidal ideation (-), feeling of hopelessness (-)    PHYSICAL EXAMINATION: Blood pressure 118/72, pulse 87, temperature 98.5 F (36.9 C), temperature source Oral, resp. rate 20, height 5' 7.5" (1.715 m), weight 180 lb 3.2 oz (81.738 kg). Body mass index is 27.79 kg/(m^2). General: Patient is a well appearing female in no acute distress HEENT: PERRLA, sclerae anicteric no conjunctival pallor, MMM Neck: supple, no palpable adenopathy Lungs: clear to auscultation bilaterally, no wheezes, rhonchi, or rales Cardiovascular: regular rate rhythm, S1, S2, no murmurs, rubs or gallops Abdomen: Soft, non-tender, non-distended, normoactive bowel sounds, no HSM Extremities: warm and well perfused, no clubbing, cyanosis, or edema Skin: No rashes or lesions, fingernails are beginning to separate at the edges, no erythema, or exudate from cuticles Neuro: Non-focal ECOG PERFORMANCE STATUS: 0 - Asymptomatic Right breast still does reveal a palpable mass, much softer no axillary  masses are noted left breast no masses or nipple discharge, right axilla with 1cm non-tender enlarged node (slightly smaller), no redness or warmth  LABORATORY DATA: Lab Results  Component Value Date   WBC 6.9 10/04/2012   HGB 9.6* 10/04/2012   HCT 28.9* 10/04/2012   MCV 113.3* 10/04/2012   PLT 42* 10/04/2012      Chemistry      Component Value Date/Time   NA 141 09/27/2012 1129   K 4.6 09/27/2012 1129   CL 100 09/27/2012 1129   CO2 31* 09/27/2012 1129   BUN 15.2 09/27/2012 1129   CREATININE 0.8 09/27/2012 1129      Component Value Date/Time   CALCIUM 9.0 09/27/2012 1129   ALKPHOS 139 09/27/2012 1129   AST 14 09/27/2012 1129   ALT 33 09/27/2012 1129   BILITOT 0.62 09/27/2012 1129       RADIOGRAPHIC STUDIES:  No results found.  ASSESSMENT: 47 year old female with  #1 high risk stage IIB invasive ductal carcinomaOf the right breast. The tumor is triple negative with a Ki-67 of 25%. She is currently receiving neoadjuvant chemotherapy consisting of FEC being given dose dense.Thus far she is tolerating it well although she does become quite neutropenic.  #2 once patient completes  her FEC for 4 cycles she will proceed with dose dense Taxotere for a total of 4 cycles.  Thsi was changed to Gemzar Carbo after hand-foot reaction to taxotere.    #3 upon completion of her chemotherapy she will proceed with mastectomy and axillary lymph node dissection of the right breast and axilla. Post mastectomy patient will need radiation therapy likely since she did have multifocal disease. But we will leave that to the radiation oncologist  #4 patient had CT of the chest performed to evaluate the right axillary lymph node were discussed at results in detail today.   PLAN:   #1Hold chemotherapy due to low platetets  #2 return in one week's time for final cycle of Gemzar carboplatinum  All questions were answered. The patient knows to call the clinic with any problems, questions or concerns. We can certainly see  the patient much sooner if necessary.  I spent 25 minutes counseling the patient face to face. The total time spent in the appointment was 30 minutes.  Drue Second, MD Medical/Oncology [facility] [phone] (beeper) [phone] (Office)

## 2012-10-05 ENCOUNTER — Ambulatory Visit: Payer: BC Managed Care – PPO

## 2012-10-11 ENCOUNTER — Encounter: Payer: Self-pay | Admitting: Oncology

## 2012-10-11 ENCOUNTER — Telehealth: Payer: Self-pay | Admitting: Oncology

## 2012-10-11 ENCOUNTER — Ambulatory Visit (HOSPITAL_BASED_OUTPATIENT_CLINIC_OR_DEPARTMENT_OTHER): Payer: BC Managed Care – PPO

## 2012-10-11 ENCOUNTER — Other Ambulatory Visit (HOSPITAL_BASED_OUTPATIENT_CLINIC_OR_DEPARTMENT_OTHER): Payer: BC Managed Care – PPO | Admitting: Lab

## 2012-10-11 ENCOUNTER — Ambulatory Visit (HOSPITAL_BASED_OUTPATIENT_CLINIC_OR_DEPARTMENT_OTHER): Payer: BC Managed Care – PPO | Admitting: Oncology

## 2012-10-11 VITALS — BP 125/87 | HR 77 | Temp 98.2°F | Resp 20 | Ht 67.5 in | Wt 181.7 lb

## 2012-10-11 DIAGNOSIS — C50419 Malignant neoplasm of upper-outer quadrant of unspecified female breast: Secondary | ICD-10-CM

## 2012-10-11 DIAGNOSIS — C50919 Malignant neoplasm of unspecified site of unspecified female breast: Secondary | ICD-10-CM

## 2012-10-11 DIAGNOSIS — C50411 Malignant neoplasm of upper-outer quadrant of right female breast: Secondary | ICD-10-CM

## 2012-10-11 DIAGNOSIS — C773 Secondary and unspecified malignant neoplasm of axilla and upper limb lymph nodes: Secondary | ICD-10-CM

## 2012-10-11 DIAGNOSIS — Z171 Estrogen receptor negative status [ER-]: Secondary | ICD-10-CM

## 2012-10-11 DIAGNOSIS — Z5111 Encounter for antineoplastic chemotherapy: Secondary | ICD-10-CM

## 2012-10-11 LAB — CBC WITH DIFFERENTIAL/PLATELET
Basophils Absolute: 0 10*3/uL (ref 0.0–0.1)
Eosinophils Absolute: 0.1 10*3/uL (ref 0.0–0.5)
HCT: 29.2 % — ABNORMAL LOW (ref 34.8–46.6)
HGB: 9.7 g/dL — ABNORMAL LOW (ref 11.6–15.9)
LYMPH%: 20.4 % (ref 14.0–49.7)
MCV: 114.1 fL — ABNORMAL HIGH (ref 79.5–101.0)
MONO#: 0.3 10*3/uL (ref 0.1–0.9)
MONO%: 7.6 % (ref 0.0–14.0)
NEUT#: 2.8 10*3/uL (ref 1.5–6.5)
Platelets: 103 10*3/uL — ABNORMAL LOW (ref 145–400)
RBC: 2.56 10*6/uL — ABNORMAL LOW (ref 3.70–5.45)
WBC: 4.1 10*3/uL (ref 3.9–10.3)
nRBC: 1 % — ABNORMAL HIGH (ref 0–0)

## 2012-10-11 LAB — COMPREHENSIVE METABOLIC PANEL (CC13)
ALT: 39 U/L (ref 0–55)
CO2: 29 mEq/L (ref 22–29)
Calcium: 8.7 mg/dL (ref 8.4–10.4)
Chloride: 105 mEq/L (ref 98–107)
Creatinine: 0.7 mg/dL (ref 0.6–1.1)
Glucose: 111 mg/dl — ABNORMAL HIGH (ref 70–99)
Total Bilirubin: 0.72 mg/dL (ref 0.20–1.20)
Total Protein: 6.4 g/dL (ref 6.4–8.3)

## 2012-10-11 MED ORDER — SODIUM CHLORIDE 0.9 % IJ SOLN
10.0000 mL | INTRAMUSCULAR | Status: DC | PRN
  Administered 2012-10-11: 10 mL
  Filled 2012-10-11: qty 10

## 2012-10-11 MED ORDER — HEPARIN SOD (PORK) LOCK FLUSH 100 UNIT/ML IV SOLN
500.0000 [IU] | Freq: Once | INTRAVENOUS | Status: AC | PRN
  Administered 2012-10-11: 500 [IU]
  Filled 2012-10-11: qty 5

## 2012-10-11 MED ORDER — ONDANSETRON 8 MG/50ML IVPB (CHCC)
8.0000 mg | Freq: Once | INTRAVENOUS | Status: AC
  Administered 2012-10-11: 8 mg via INTRAVENOUS

## 2012-10-11 MED ORDER — DEXAMETHASONE SODIUM PHOSPHATE 10 MG/ML IJ SOLN
10.0000 mg | Freq: Once | INTRAMUSCULAR | Status: AC
  Administered 2012-10-11: 10 mg via INTRAVENOUS

## 2012-10-11 MED ORDER — SODIUM CHLORIDE 0.9 % IV SOLN
Freq: Once | INTRAVENOUS | Status: AC
  Administered 2012-10-11: 11:00:00 via INTRAVENOUS

## 2012-10-11 MED ORDER — SODIUM CHLORIDE 0.9 % IV SOLN
726.5000 mg | Freq: Once | INTRAVENOUS | Status: AC
  Administered 2012-10-11: 730 mg via INTRAVENOUS
  Filled 2012-10-11: qty 73

## 2012-10-11 MED ORDER — SODIUM CHLORIDE 0.9 % IV SOLN
800.0000 mg/m2 | Freq: Once | INTRAVENOUS | Status: AC
  Administered 2012-10-11: 1520 mg via INTRAVENOUS
  Filled 2012-10-11: qty 40

## 2012-10-11 NOTE — Patient Instructions (Addendum)
#  1 we discussed your CBC results. Your platelet count is 103,000. This is adequate to proceed with final cycle of Gemzar and carboplatinum.  #2 I will see you back in one week's time for followup and labs.  #3 I have sent a message to Dr. Jamey Ripa regarding your completion of chemotherapy. He should be able to colon to surgery in about 4-5 weeks time.

## 2012-10-11 NOTE — Patient Instructions (Addendum)
[facility] Discharge Instructions for Patients Receiving Chemotherapy  Today you received the following chemotherapy agents Gemzar/Carboplatin.  To help prevent nausea and vomiting after your treatment, we encourage you to take your nausea medication as prescribed.   If you develop nausea and vomiting that is not controlled by your nausea medication, call the clinic. If it is after clinic hours your family physician or the after hours number for the clinic or go to the Emergency Department.   BELOW ARE SYMPTOMS THAT SHOULD BE REPORTED IMMEDIATELY:  *FEVER GREATER THAN 100.5 F  *CHILLS WITH OR WITHOUT FEVER  NAUSEA AND VOMITING THAT IS NOT CONTROLLED WITH YOUR NAUSEA MEDICATION  *UNUSUAL SHORTNESS OF BREATH  *UNUSUAL BRUISING OR BLEEDING  TENDERNESS IN MOUTH AND THROAT WITH OR WITHOUT PRESENCE OF ULCERS  *URINARY PROBLEMS  *BOWEL PROBLEMS  UNUSUAL RASH Items with * indicate a potential emergency and should be followed up as soon as possible.  Feel free to call the clinic you have any questions or concerns. The clinic phone number is [phone].   I have been informed and understand all the instructions given to me. I know to contact the clinic, my physician, or go to the Emergency Department if any problems should occur. I do not have any questions at this time, but understand that I may call the clinic during office hours   should I have any questions or need assistance in obtaining follow up care.    __________________________________________  _____________  __________ Signature of Patient or Authorized Representative            Date                   Time    __________________________________________ Nurse's Signature

## 2012-10-11 NOTE — Telephone Encounter (Signed)
, °

## 2012-10-12 ENCOUNTER — Ambulatory Visit (HOSPITAL_BASED_OUTPATIENT_CLINIC_OR_DEPARTMENT_OTHER): Payer: BC Managed Care – PPO

## 2012-10-12 VITALS — BP 142/73 | HR 77 | Temp 97.2°F

## 2012-10-12 DIAGNOSIS — Z5189 Encounter for other specified aftercare: Secondary | ICD-10-CM

## 2012-10-12 DIAGNOSIS — C50919 Malignant neoplasm of unspecified site of unspecified female breast: Secondary | ICD-10-CM

## 2012-10-12 DIAGNOSIS — C50419 Malignant neoplasm of upper-outer quadrant of unspecified female breast: Secondary | ICD-10-CM

## 2012-10-12 DIAGNOSIS — C773 Secondary and unspecified malignant neoplasm of axilla and upper limb lymph nodes: Secondary | ICD-10-CM

## 2012-10-12 MED ORDER — PEGFILGRASTIM INJECTION 6 MG/0.6ML
6.0000 mg | Freq: Once | SUBCUTANEOUS | Status: AC
  Administered 2012-10-12: 6 mg via SUBCUTANEOUS

## 2012-10-15 ENCOUNTER — Other Ambulatory Visit: Payer: Self-pay | Admitting: Certified Registered Nurse Anesthetist

## 2012-10-16 ENCOUNTER — Telehealth (INDEPENDENT_AMBULATORY_CARE_PROVIDER_SITE_OTHER): Payer: Self-pay | Admitting: General Surgery

## 2012-10-16 NOTE — Telephone Encounter (Signed)
Patient is leaning towards just doing a right mastectomy and addressing the left side at the time of delayed reconstruction. She wants to speak with Dr Jamey Ripa one more time prior to scheduling. Appt made Friday at 12:10 pm.

## 2012-10-16 NOTE — Telephone Encounter (Signed)
Message copied by Liliana Cline on Wed Oct 16, 2012  1:39 PM ------      Message from: Currie Paris      Created: Mon Oct 14, 2012  1:24 PM      Regarding: FW: follow up       Can you check with her about what she is going to want to do. She was considering mastectomy on the other side and reconstruction. I can put orders in once we know what we are going to do       ----- Message -----         From: Victorino December, MD         Sent: 10/11/2012  10:24 AM           To: Currie Paris, MD      Subject: follow up                                                Victoriano Lain is receiving her last chemo today.            She should be ready for her surgery in about 4-5 weeks.            Thank you      [place]       ------

## 2012-10-18 ENCOUNTER — Encounter: Payer: Self-pay | Admitting: Oncology

## 2012-10-18 ENCOUNTER — Telehealth: Payer: Self-pay | Admitting: Oncology

## 2012-10-18 ENCOUNTER — Other Ambulatory Visit (HOSPITAL_BASED_OUTPATIENT_CLINIC_OR_DEPARTMENT_OTHER): Payer: BC Managed Care – PPO | Admitting: Lab

## 2012-10-18 ENCOUNTER — Ambulatory Visit (INDEPENDENT_AMBULATORY_CARE_PROVIDER_SITE_OTHER): Payer: BC Managed Care – PPO | Admitting: Surgery

## 2012-10-18 ENCOUNTER — Encounter (INDEPENDENT_AMBULATORY_CARE_PROVIDER_SITE_OTHER): Payer: Self-pay | Admitting: Surgery

## 2012-10-18 ENCOUNTER — Ambulatory Visit (HOSPITAL_BASED_OUTPATIENT_CLINIC_OR_DEPARTMENT_OTHER): Payer: BC Managed Care – PPO | Admitting: Oncology

## 2012-10-18 VITALS — BP 113/75 | HR 76 | Temp 98.3°F | Resp 20 | Ht 67.5 in | Wt 179.2 lb

## 2012-10-18 VITALS — BP 118/76 | HR 66 | Temp 97.6°F | Resp 18 | Ht 67.5 in | Wt 178.0 lb

## 2012-10-18 DIAGNOSIS — C50411 Malignant neoplasm of upper-outer quadrant of right female breast: Secondary | ICD-10-CM

## 2012-10-18 DIAGNOSIS — C50419 Malignant neoplasm of upper-outer quadrant of unspecified female breast: Secondary | ICD-10-CM

## 2012-10-18 DIAGNOSIS — C773 Secondary and unspecified malignant neoplasm of axilla and upper limb lymph nodes: Secondary | ICD-10-CM

## 2012-10-18 DIAGNOSIS — Z171 Estrogen receptor negative status [ER-]: Secondary | ICD-10-CM

## 2012-10-18 LAB — COMPREHENSIVE METABOLIC PANEL (CC13)
ALT: 41 U/L (ref 0–55)
Albumin: 3.3 g/dL — ABNORMAL LOW (ref 3.5–5.0)
CO2: 33 mEq/L — ABNORMAL HIGH (ref 22–29)
Calcium: 8.9 mg/dL (ref 8.4–10.4)
Chloride: 101 mEq/L (ref 98–107)
Glucose: 83 mg/dl (ref 70–99)
Potassium: 3.5 mEq/L (ref 3.5–5.1)
Sodium: 140 mEq/L (ref 136–145)
Total Protein: 6.1 g/dL — ABNORMAL LOW (ref 6.4–8.3)

## 2012-10-18 LAB — CBC WITH DIFFERENTIAL/PLATELET
BASO%: 0.3 % (ref 0.0–2.0)
Eosinophils Absolute: 0 10*3/uL (ref 0.0–0.5)
MCHC: 34.5 g/dL (ref 31.5–36.0)
MONO#: 0.3 10*3/uL (ref 0.1–0.9)
NEUT#: 3.7 10*3/uL (ref 1.5–6.5)
Platelets: 45 10*3/uL — ABNORMAL LOW (ref 145–400)
RBC: 2.47 10*6/uL — ABNORMAL LOW (ref 3.70–5.45)
WBC: 5.1 10*3/uL (ref 3.9–10.3)
lymph#: 1 10*3/uL (ref 0.9–3.3)

## 2012-10-18 MED ORDER — CIPROFLOXACIN HCL 500 MG PO TABS: 500.0000 mg | ORAL_TABLET | Freq: Two times a day (BID) | ORAL | Status: DC

## 2012-10-18 NOTE — Patient Instructions (Signed)
We will schedule surgery for removal of your right breast and the lymph nodes from your right armpit

## 2012-10-18 NOTE — Patient Instructions (Addendum)
Proceed with surgery  I will see you back in 3 months with labs

## 2012-10-18 NOTE — Progress Notes (Signed)
NAME: Nicole Valentine       DOB: 11-19-1965           DATE: 10/18/2012       ZOX:[id number]  CC:  Chief Complaint  Patient presents with  . Pre-op Exam    Discuss surgery after chemo    HPI: this patient has come in following completion of her neoadjuvant chemotherapy. She is ready to discuss surgery and schedule. She continues to plan a right modified mastectomy. She plans to deferred her reconstructions since she will have radiation therapy after surgery.she is planning an elective prophylactic left mastectomy which she has reconstruction. She currently is asymptomatic and feels well. She is no longer able to palpate a mass in her breast for her cancer.  EXAM: VITAL SIGNS:  BP 118/76  Pulse 66  Temp(Src) 97.6 F (36.4 C) (Oral)  Resp 18  Ht 5' 7.5" (1.715 m)  Wt 178 lb (80.74 kg)  BMI 27.45 kg/m2  GENERAL:  The patient is alert, oriented, and generally healthy-appearing, NAD. Mood and affect are normal.  HEENT:  The head is normocephalic, the eyes nonicteric, the pupils were round regular and equal. EOMs are normal. Pharynx normal. Dentition good.  NECK:  The neck is supple and there are no masses or thyromegaly.  LUNGS: Normal respirations and clear to auscultation.  HEART: Regular rhythm, with no murmurs rubs or gallops. Pulses are intact carotid dorsalis pedis and posterior tibial. No significant varicosities are noted.  BREASTS:  breasts symmetric bilaterally. There is no residual palpable mass in either breast. Nontender.  LYMPHATICS: There is no axillary supraclavicular adenopathy on either side noted.  ABDOMEN: Soft, flat, and nontender. No masses or organomegaly is noted. No hernias are noted. Bowel sounds are normal.  EXTREMITIES:  Good range of motion, no edema.  IMP: triple-negative stage II right breast cancer, excellent clinical response to neoadjuvant chemotherapy  PLAN: right modified mastectomy. I again discussed the indications risks and  complications with the patient and all questions have been answered. We'll go ahead and get her scheduled for 3-4 weeks from now.  Rosalea Withrow J 10/18/2012

## 2012-10-23 ENCOUNTER — Other Ambulatory Visit: Payer: Self-pay | Admitting: *Deleted

## 2012-10-23 DIAGNOSIS — C50919 Malignant neoplasm of unspecified site of unspecified female breast: Secondary | ICD-10-CM

## 2012-10-23 MED ORDER — LORAZEPAM 0.5 MG PO TABS: 0.5000 mg | ORAL_TABLET | Freq: Four times a day (QID) | ORAL | Status: DC | PRN

## 2012-10-23 NOTE — Telephone Encounter (Signed)
Patient currently at [facility], [place], requesting refill. Spoke with Dr Welton Flakes, called back to pharmacy.

## 2012-11-10 NOTE — Progress Notes (Signed)
OFFICE PROGRESS NOTE  CC  [name], MD [address] [place] [address] Dr. Fatima Sanger Dr. 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 Diagnosed November 2013. Clinical stage II (T2 N1)  PRIOR THERAPY:  #1 patient originally presented to the multidisciplinary breast clinic on 04/10/2012. She originally palpated a right breast mass. She had a mammogram performed that showed dense breasts bilaterally. Ultrasound of the right breast showed 2.3 x 1.9 cm area of abnormality with multiple abnormal lymph nodes. MRI of the bilateral breasts showed asymmetrical enhancement throughout the right breast consistent with multicentric disease. An ultrasound-guided biopsy performed. The known area of disease measured 1.9 x 1.9 x 2.3 cm. Multiple abnormal positive right axillary lymph nodes were noted. 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 Biopsy of the right axillary lymph node was positive for malignancy with extracapsular extension. Patient was originally seen by Dr. Jamey Ripa Dr. Donnie Coffin and Dr. Michell Heinrich. She has elected to have a right mastectomy eventually and declined biopsies of any other areas within the breast.  #2 after extensive discussion patient initially was started on neoadjuvant chemotherapy by Dr. Donnie Coffin. He began her on neoadjuvant FEC given dose dense every 2 weeks with day 2 Neulasta. For 4 cycles.Followed by dose dense Taxotere every 2 weeks for a total of 4 cycles. She began her chemotherapy on 04/26/2012.  She started Taxotere on 06/21/12.  She unfortunately had a bad hand-foot reaction and this was discontinued.  She received Gemzar/Carbo every 2 weeks from 07/05/12 - 10/11/12  CURRENT THERAPY:patient is s/pcycle 6 of Gemzar and Carbo.  INTERVAL  HISTORY: Tyniesha Howald 47 y.o. female returns for followup today following cycle 6 of her chemotherapy.  Overall she feels well she has been doing a lot of activities including taking care of her children. She denies any fevers or chills or night sweats easy bruising no hematuria hematochezia melena hemoptysis or hematemesis. Remainder of the 10 point review of systems is negative. MEDICAL HISTORY: Past Medical History  Diagnosis Date  . Thyroid disease   . Hypothyroidism   . Contact lens/glasses fitting     wears contacts or glasses  . Complication of anesthesia     her father and uncle both had very difficult time waking up-was  something they told them may be hereditory. she will find out.  . Breast cancer dx'd 04-10-12-rt    ALLERGIES:  is allergic to almond meal.  MEDICATIONS:  Current Outpatient Prescriptions  Medication Sig Dispense Refill  . calcium carbonate (OS-CAL - DOSED IN MG OF ELEMENTAL CALCIUM) 1250 MG tablet Take 1 tablet by mouth daily.      . Cholecalciferol (VITAMIN D-3 PO) Take 2,000 Int'l Units by mouth.      . Cyanocobalamin (VITAMIN B-12 PO) Take 5,000 mg by mouth daily.      . Ferrous Sulfate Dried (SLOW RELEASE IRON) 45 MG TBCR Take 65 mg by mouth daily.       [name] levothyroxine (SYNTHROID, LEVOTHROID) 200 MCG tablet 150 mcg.       . lidocaine-prilocaine (EMLA) cream Apply topically as needed.  30 g  0  . Alum & Mag Hydroxide-Simeth (MAGIC MOUTHWASH) SOLN Take 5 mLs by mouth 3 (three) times daily.  100 mL  1  . ciprofloxacin (CIPRO) 500 MG tablet Take 1 tablet (  500 mg total) by mouth 2 (two) times daily.  14 tablet  0  . dexamethasone (DECADRON) 4 MG tablet Take 2 tablets by mouth once a day on the day after chemotherapy and then take 2 tablets two times a day for 2 days. Take with food.  30 tablet  3  . LORazepam (ATIVAN) 0.5 MG tablet Take 1 tablet (0.5 mg total) by mouth every 6 (six) hours as needed.  30 tablet  0   No current facility-administered  medications for this visit.    SURGICAL HISTORY:  Past Surgical History  Procedure Laterality Date  . Wisdom tooth extraction    . Portacath placement  04/22/2012    Procedure: INSERTION PORT-A-CATH;  Surgeon: Currie Paris, MD;  Location: [facility];  Service: General;  Laterality: Left;    REVIEW OF SYSTEMS:  General: fatigue (+), night sweats (-), fever (-), pain (-) Lymph: palpable nodes (-) HEENT: vision changes (-), mucositis (-), gum bleeding (-), epistaxis (-) Cardiovascular: chest pain (-), palpitations (-) Pulmonary: shortness of breath (-), dyspnea on exertion (-), cough (-), hemoptysis (-) GI:  Early satiety (-), melena (-), dysphagia (-), nausea/vomiting (-), diarrhea (-) GU: dysuria (-), hematuria (-), incontinence (-) Musculoskeletal: joint swelling (-), joint pain (-), back pain (-) Neuro: weakness (-), numbness (-), headache (-), confusion (-) Skin: Rash (-), lesions (-), dryness (-) Psych: depression (-), suicidal/homicidal ideation (-), feeling of hopelessness (-)    PHYSICAL EXAMINATION: Blood pressure 113/75, pulse 76, temperature 98.3 F (36.8 C), temperature source Oral, resp. rate 20, height 5' 7.5" (1.715 m), weight 179 lb 3.2 oz (81.285 kg). Body mass index is 27.64 kg/(m^2). General: Patient is a well appearing female in no acute distress HEENT: PERRLA, sclerae anicteric no conjunctival pallor, MMM Neck: supple, no palpable adenopathy Lungs: clear to auscultation bilaterally, no wheezes, rhonchi, or rales Cardiovascular: regular rate rhythm, S1, S2, no murmurs, rubs or gallops Abdomen: Soft, non-tender, non-distended, normoactive bowel sounds, no HSM Extremities: warm and well perfused, no clubbing, cyanosis, or edema Skin: No rashes or lesions, fingernails are beginning to separate at the edges, no erythema, or exudate from cuticles Neuro: Non-focal ECOG PERFORMANCE STATUS: 0 - Asymptomatic Right breast still does reveal a palpable  mass, much softer no axillary masses are noted left breast no masses or nipple discharge, right axilla with 1cm non-tender enlarged node (slightly smaller), no redness or warmth  LABORATORY DATA: Lab Results  Component Value Date   WBC 5.1 10/18/2012   HGB 9.8* 10/18/2012   HCT 28.4* 10/18/2012   MCV 115.3* 10/18/2012   PLT 45* 10/18/2012      Chemistry      Component Value Date/Time   NA 140 10/18/2012 0859   K 3.5 10/18/2012 0859   CL 101 10/18/2012 0859   CO2 33* 10/18/2012 0859   BUN 17.3 10/18/2012 0859   CREATININE 0.7 10/18/2012 0859      Component Value Date/Time   CALCIUM 8.9 10/18/2012 0859   ALKPHOS 127 10/18/2012 0859   AST 15 10/18/2012 0859   ALT 41 10/18/2012 0859   BILITOT 0.87 10/18/2012 0859       RADIOGRAPHIC STUDIES:  No results found.  ASSESSMENT: 47 year old female with  #1 high risk stage IIB invasive ductal carcinomaOf the right breast. The tumor is triple negative with a Ki-67 of 25%. She is currently receiving neoadjuvant chemotherapy consisting of FEC being given dose dense.Thus far she is tolerating it well although she does become quite neutropenic.  #  2 once patient completes her FEC for 4 cycles she will proceed with dose dense Taxotere for a total of 4 cycles.  This was changed to Gemzar Carbo after hand-foot reaction to taxotere.  She is now completing her final cycle of Gemzar and carboplatinum today 10/11/2012  #3 upon completion of her chemotherapy she will proceed with mastectomy and axillary lymph node dissection of the right breast and axilla. Post mastectomy patient will need radiation therapy likely since she did have multifocal disease. But we will leave that to the radiation oncologist  #4 patient had CT of the chest performed to evaluate the right axillary lymph node were discussed at results in detail today.   PLAN:   #1patient has complete her chemotherapy  2. She will now be seen back by her surgeon in follow up for definitive  surgery  3. Return after the surgery  All questions were answered. The patient knows to call the clinic with any problems, questions or concerns. We can certainly see the patient much sooner if necessary.  I spent 25 minutes counseling the patient face to face. The total time spent in the appointment was 30 minutes.  Drue Second, MD Medical/Oncology [facility] [phone] (beeper) [phone] (Office)

## 2012-11-10 NOTE — Progress Notes (Signed)
OFFICE PROGRESS NOTE  CC  Nicole Found, MD [address] [place] [address] Dr. Fatima Sanger Dr. 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 Diagnosed November 2013. Clinical stage II (T2 N1)  PRIOR THERAPY:  #1 patient originally presented to the multidisciplinary breast clinic on 04/10/2012. She originally palpated a right breast mass. She had a mammogram performed that showed dense breasts bilaterally. Ultrasound of the right breast showed 2.3 x 1.9 cm area of abnormality with multiple abnormal lymph nodes. MRI of the bilateral breasts showed asymmetrical enhancement throughout the right breast consistent with multicentric disease. An ultrasound-guided biopsy performed. The known area of disease measured 1.9 x 1.9 x 2.3 cm. Multiple abnormal positive right axillary lymph nodes were noted. 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 Biopsy of the right axillary lymph node was positive for malignancy with extracapsular extension. Patient was originally seen by Dr. Jamey Ripa Dr. Donnie Coffin and Dr. Michell Heinrich. She has elected to have a right mastectomy eventually and declined biopsies of any other areas within the breast.  #2 after extensive discussion patient initially was started on neoadjuvant chemotherapy by Dr. Donnie Coffin. He began her on neoadjuvant FEC given dose dense every 2 weeks with day 2 Neulasta. For 4 cycles.Followed by dose dense Taxotere every 2 weeks for a total of 4 cycles. She began her chemotherapy on 04/26/2012.  She will start Taxotere on 06/21/12.  She unfortunately had a bad hand-foot reaction and this was discontinued.  She will proceed with Gemzar/Carbo every 2 weeks.    CURRENT THERAPY:patient is here for cycle 6 of Gemzar and Carbo.  INTERVAL HISTORY: Nicole Valentine 47 y.o. female returns for followup today prior to cycle 6 of her chemotherapy. Her platelets are 103,000. However she has not had any bleeding problems. Overall she feels well she has been doing a lot of activities including taking care of her children. She denies any fevers or chills or night sweats easy bruising no hematuria hematochezia melena hemoptysis or hematemesis. Remainder of the 10 point review of systems is negative. MEDICAL HISTORY: Past Medical History  Diagnosis Date  . Thyroid disease   . Hypothyroidism   . Contact lens/glasses fitting     wears contacts or glasses  . Complication of anesthesia     her father and uncle both had very difficult time waking up-was  something they told them may be hereditory. she will find out.  . Breast cancer dx'd 04-10-12-rt    ALLERGIES:  is allergic to almond meal.  MEDICATIONS:  Current Outpatient Prescriptions  Medication Sig Dispense Refill  . calcium carbonate (OS-CAL - DOSED IN MG OF ELEMENTAL CALCIUM) 1250 MG tablet Take 1 tablet by mouth daily.      . Cholecalciferol (VITAMIN D-3 PO) Take 2,000 Int'l Units by mouth.      . Cyanocobalamin (VITAMIN B-12 PO) Take 5,000 mg by mouth daily.      . Ferrous Sulfate Dried (SLOW RELEASE IRON) 45 MG TBCR Take 65 mg by mouth daily.       [name] levothyroxine (SYNTHROID, LEVOTHROID) 200 MCG tablet 150 mcg.       . lidocaine-prilocaine (EMLA) cream Apply topically as needed.  30 g  0  . Alum & Mag Hydroxide-Simeth (MAGIC MOUTHWASH) SOLN Take 5 mLs by mouth 3 (three) times daily.  100 mL  1  . ciprofloxacin (CIPRO) 500 MG tablet Take 1 tablet (500 mg total) by mouth 2 (two) times daily.  14 tablet  0  . dexamethasone (DECADRON) 4 MG tablet Take 2 tablets by mouth once a day on the day after chemotherapy and then take 2 tablets two times a day for 2 days. Take with food.  30 tablet  3  . LORazepam (ATIVAN) 0.5 MG tablet Take 1 tablet (0.5 mg total) by mouth every 6 (six) hours as needed.  30  tablet  0   No current facility-administered medications for this visit.    SURGICAL HISTORY:  Past Surgical History  Procedure Laterality Date  . Wisdom tooth extraction    . Portacath placement  04/22/2012    Procedure: INSERTION PORT-A-CATH;  Surgeon: Currie Paris, MD;  Location: [facility];  Service: General;  Laterality: Left;    REVIEW OF SYSTEMS:  General: fatigue (+), night sweats (-), fever (-), pain (-) Lymph: palpable nodes (-) HEENT: vision changes (-), mucositis (-), gum bleeding (-), epistaxis (-) Cardiovascular: chest pain (-), palpitations (-) Pulmonary: shortness of breath (-), dyspnea on exertion (-), cough (-), hemoptysis (-) GI:  Early satiety (-), melena (-), dysphagia (-), nausea/vomiting (-), diarrhea (-) GU: dysuria (-), hematuria (-), incontinence (-) Musculoskeletal: joint swelling (-), joint pain (-), back pain (-) Neuro: weakness (-), numbness (-), headache (-), confusion (-) Skin: Rash (-), lesions (-), dryness (-) Psych: depression (-), suicidal/homicidal ideation (-), feeling of hopelessness (-)    PHYSICAL EXAMINATION: Blood pressure 125/87, pulse 77, temperature 98.2 F (36.8 C), temperature source Oral, resp. rate 20, height 5' 7.5" (1.715 m), weight 181 lb 11.2 oz (82.419 kg). Body mass index is 28.02 kg/(m^2). General: Patient is a well appearing female in no acute distress HEENT: PERRLA, sclerae anicteric no conjunctival pallor, MMM Neck: supple, no palpable adenopathy Lungs: clear to auscultation bilaterally, no wheezes, rhonchi, or rales Cardiovascular: regular rate rhythm, S1, S2, no murmurs, rubs or gallops Abdomen: Soft, non-tender, non-distended, normoactive bowel sounds, no HSM Extremities: warm and well perfused, no clubbing, cyanosis, or edema Skin: No rashes or lesions, fingernails are beginning to separate at the edges, no erythema, or exudate from cuticles Neuro: Non-focal ECOG PERFORMANCE STATUS: 0 -  Asymptomatic Right breast still does reveal a palpable mass, much softer no axillary masses are noted left breast no masses or nipple discharge, right axilla with 1cm non-tender enlarged node (slightly smaller), no redness or warmth  LABORATORY DATA: Lab Results  Component Value Date   WBC 5.1 10/18/2012   HGB 9.8* 10/18/2012   HCT 28.4* 10/18/2012   MCV 115.3* 10/18/2012   PLT 45* 10/18/2012      Chemistry      Component Value Date/Time   NA 140 10/18/2012 0859   K 3.5 10/18/2012 0859   CL 101 10/18/2012 0859   CO2 33* 10/18/2012 0859   BUN 17.3 10/18/2012 0859   CREATININE 0.7 10/18/2012 0859      Component Value Date/Time   CALCIUM 8.9 10/18/2012 0859   ALKPHOS 127 10/18/2012 0859   AST 15 10/18/2012 0859   ALT 41 10/18/2012 0859   BILITOT 0.87 10/18/2012 0859       RADIOGRAPHIC STUDIES:  No results Valentine.  ASSESSMENT: 47 year old female with  #1 high risk stage IIB invasive ductal carcinomaOf the right breast. The tumor is triple negative with a Ki-67 of 25%. She is currently receiving neoadjuvant chemotherapy consisting of FEC being given dose  dense.Thus far she is tolerating it well although she does become quite neutropenic.  #2 once patient completes her FEC for 4 cycles she will proceed with dose dense Taxotere for a total of 4 cycles.  This was changed to Gemzar Carbo after hand-foot reaction to taxotere.  She is now completing her final cycle of Gemzar and carboplatinum today 10/12/1998 410  #3 upon completion of her chemotherapy she will proceed with mastectomy and axillary lymph node dissection of the right breast and axilla. Post mastectomy patient will need radiation therapy likely since she did have multifocal disease. But we will leave that to the radiation oncologist  #4 patient had CT of the chest performed to evaluate the right axillary lymph node were discussed at results in detail today.   PLAN:   #1patient now will proceed with her final cycle of Gemzar and  carboplatinum. She understands that her platelet count is 103,000. This is adequate to proceed with the chemotherapy.  #2 she will return in one week's time for interim labs.  #3 she will thereafter proceed with seeing her surgeon and scheduling her definitive surgery.  #4 I will plan on seeing her back after her surgery.  All questions were answered. The patient knows to call the clinic with any problems, questions or concerns. We can certainly see the patient much sooner if necessary.  I spent 25 minutes counseling the patient face to face. The total time spent in the appointment was 30 minutes.  Drue Second, MD Medical/Oncology [facility] [phone] (beeper) [phone] (Office)

## 2012-11-12 ENCOUNTER — Encounter (HOSPITAL_BASED_OUTPATIENT_CLINIC_OR_DEPARTMENT_OTHER): Payer: Self-pay | Admitting: *Deleted

## 2012-11-12 NOTE — Progress Notes (Signed)
Pt was here for PAC-did well with chemo-new labs ordered [facility]-to come in for those-bring all meds and overnight bag

## 2012-11-15 ENCOUNTER — Encounter (HOSPITAL_BASED_OUTPATIENT_CLINIC_OR_DEPARTMENT_OTHER)
Admit: 2012-11-15 | Discharge: 2012-11-15 | Disposition: A | Discharge status: Home / Self Care | Payer: BC Managed Care – PPO | Attending: Surgery | Admitting: Surgery

## 2012-11-15 LAB — BASIC METABOLIC PANEL
CO2: 30 mEq/L (ref 19–32)
Chloride: 104 mEq/L (ref 96–112)
Creatinine, Ser: 0.67 mg/dL (ref 0.50–1.10)
GFR calc Af Amer: >90 mL/min (ref >90)
Glucose, Bld: 104 mg/dL — ABNORMAL HIGH (ref 70–99)
Potassium: 3.7 mEq/L (ref 3.5–5.1)
Sodium: 142 mEq/L (ref 135–145)

## 2012-11-15 LAB — CBC WITH DIFFERENTIAL/PLATELET
Eosinophils Relative: 1 % (ref 0–5)
HCT: 36.9 % (ref 36.0–46.0)
Lymphocytes Relative: 30 % (ref 12–46)
Lymphs Abs: 1 10*3/uL (ref 0.7–4.0)
MCV: 109.8 fL — ABNORMAL HIGH (ref 78.0–100.0)
Monocytes Absolute: 0.4 10*3/uL (ref 0.1–1.0)
Monocytes Relative: 12 % (ref 3–12)
RBC: 3.36 MIL/uL — ABNORMAL LOW (ref 3.87–5.11)
WBC: 3.2 10*3/uL — ABNORMAL LOW (ref 4.0–10.5)

## 2012-11-15 MED ORDER — VANCOMYCIN HCL IN DEXTROSE 1-5 GM/200ML-% IV SOLN
INTRAVENOUS | Status: AC
  Filled 2012-11-15: qty 200

## 2012-11-18 ENCOUNTER — Ambulatory Visit (HOSPITAL_BASED_OUTPATIENT_CLINIC_OR_DEPARTMENT_OTHER)
Admission: RE | Admit: 2012-11-18 | Discharge: 2012-11-19 | Disposition: A | Discharge status: Home / Self Care | Payer: BC Managed Care – PPO | Source: Ambulatory Visit | Attending: Surgery | Admitting: Surgery

## 2012-11-18 ENCOUNTER — Encounter (HOSPITAL_BASED_OUTPATIENT_CLINIC_OR_DEPARTMENT_OTHER): Payer: Self-pay | Admitting: Certified Registered Nurse Anesthetist

## 2012-11-18 ENCOUNTER — Encounter (HOSPITAL_BASED_OUTPATIENT_CLINIC_OR_DEPARTMENT_OTHER)
Admission: RE | Disposition: A | Discharge status: Home / Self Care | Payer: Self-pay | Source: Ambulatory Visit | Attending: Surgery

## 2012-11-18 ENCOUNTER — Ambulatory Visit (HOSPITAL_BASED_OUTPATIENT_CLINIC_OR_DEPARTMENT_OTHER): Payer: BC Managed Care – PPO | Admitting: Certified Registered Nurse Anesthetist

## 2012-11-18 DIAGNOSIS — Z9221 Personal history of antineoplastic chemotherapy: Secondary | ICD-10-CM | POA: Insufficient documentation

## 2012-11-18 DIAGNOSIS — E039 Hypothyroidism, unspecified: Secondary | ICD-10-CM | POA: Insufficient documentation

## 2012-11-18 DIAGNOSIS — C50419 Malignant neoplasm of upper-outer quadrant of unspecified female breast: Secondary | ICD-10-CM

## 2012-11-18 DIAGNOSIS — Z171 Estrogen receptor negative status [ER-]: Secondary | ICD-10-CM | POA: Insufficient documentation

## 2012-11-18 DIAGNOSIS — C50411 Malignant neoplasm of upper-outer quadrant of right female breast: Secondary | ICD-10-CM

## 2012-11-18 DIAGNOSIS — N6019 Diffuse cystic mastopathy of unspecified breast: Secondary | ICD-10-CM | POA: Insufficient documentation

## 2012-11-18 DIAGNOSIS — C50919 Malignant neoplasm of unspecified site of unspecified female breast: Secondary | ICD-10-CM | POA: Insufficient documentation

## 2012-11-18 HISTORY — PX: MODIFIED MASTECTOMY: SHX5268

## 2012-11-18 SURGERY — MODIFIED MASTECTOMY
Anesthesia: General | Site: Breast | Laterality: Right | Wound class: Clean

## 2012-11-18 MED ORDER — LACTATED RINGERS IV SOLN
INTRAVENOUS | Status: DC
  Administered 2012-11-18 (×2): via INTRAVENOUS

## 2012-11-18 MED ORDER — ACETAMINOPHEN 10 MG/ML IV SOLN: 1000.0000 mg | Freq: Once | INTRAVENOUS | Status: AC | PRN

## 2012-11-18 MED ORDER — MIDAZOLAM HCL 2 MG/2ML IJ SOLN: 1.0000 mg | INTRAMUSCULAR | Status: DC | PRN

## 2012-11-18 MED ORDER — BUPIVACAINE HCL (PF) 0.25 % IJ SOLN
INTRAMUSCULAR | Status: DC | PRN
  Administered 2012-11-18: 10 mL

## 2012-11-18 MED ORDER — HYDROMORPHONE HCL PF 2 MG/ML IJ SOLN: 2.0000 mg | INTRAMUSCULAR | Status: DC | PRN

## 2012-11-18 MED ORDER — FENTANYL CITRATE 0.05 MG/ML IJ SOLN: 50.0000 ug | INTRAMUSCULAR | Status: DC | PRN

## 2012-11-18 MED ORDER — CEFAZOLIN SODIUM 1-5 GM-% IV SOLN
1.0000 g | Freq: Four times a day (QID) | INTRAVENOUS | Status: AC
  Administered 2012-11-18 – 2012-11-19 (×3): 1 g via INTRAVENOUS

## 2012-11-18 MED ORDER — ONDANSETRON HCL 4 MG/2ML IJ SOLN: 4.0000 mg | Freq: Four times a day (QID) | INTRAMUSCULAR | Status: DC | PRN

## 2012-11-18 MED ORDER — DEXAMETHASONE SODIUM PHOSPHATE 4 MG/ML IJ SOLN
INTRAMUSCULAR | Status: DC | PRN
  Administered 2012-11-18: 10 mg via INTRAVENOUS

## 2012-11-18 MED ORDER — KCL-LACTATED RINGERS-D5W 20 MEQ/L IV SOLN
INTRAVENOUS | Status: DC
  Administered 2012-11-18: 13:00:00 via INTRAVENOUS
  Filled 2012-11-18 (×2): qty 1000

## 2012-11-18 MED ORDER — ONDANSETRON HCL 4 MG/2ML IJ SOLN
INTRAMUSCULAR | Status: DC | PRN
  Administered 2012-11-18: 4 mg via INTRAVENOUS

## 2012-11-18 MED ORDER — HEPARIN SODIUM (PORCINE) 5000 UNIT/ML IJ SOLN
5000.0000 [IU] | Freq: Three times a day (TID) | INTRAMUSCULAR | Status: DC
  Filled 2012-11-18: qty 1

## 2012-11-18 MED ORDER — ONDANSETRON HCL 4 MG/2ML IJ SOLN: 4.0000 mg | Freq: Once | INTRAMUSCULAR | Status: AC | PRN

## 2012-11-18 MED ORDER — CHLORHEXIDINE GLUCONATE 4 % EX LIQD: 1.0000 "application " | Freq: Once | CUTANEOUS | Status: DC

## 2012-11-18 MED ORDER — LIDOCAINE HCL (CARDIAC) 20 MG/ML IV SOLN
INTRAVENOUS | Status: DC | PRN
  Administered 2012-11-18: 60 mg via INTRAVENOUS

## 2012-11-18 MED ORDER — CEFAZOLIN SODIUM-DEXTROSE 2-3 GM-% IV SOLR
2.0000 g | INTRAVENOUS | Status: AC
  Administered 2012-11-18: 2 g via INTRAVENOUS

## 2012-11-18 MED ORDER — OXYCODONE-ACETAMINOPHEN 5-325 MG PO TABS
1.0000 | ORAL_TABLET | ORAL | Status: DC | PRN
  Administered 2012-11-18 – 2012-11-19 (×3): 2 via ORAL

## 2012-11-18 MED ORDER — PROPOFOL 10 MG/ML IV BOLUS
INTRAVENOUS | Status: DC | PRN
  Administered 2012-11-18: 200 mg via INTRAVENOUS

## 2012-11-18 MED ORDER — MIDAZOLAM HCL 5 MG/5ML IJ SOLN
INTRAMUSCULAR | Status: DC | PRN
  Administered 2012-11-18: 2 mg via INTRAVENOUS

## 2012-11-18 MED ORDER — FENTANYL CITRATE 0.05 MG/ML IJ SOLN
INTRAMUSCULAR | Status: DC | PRN
  Administered 2012-11-18 (×2): 50 ug via INTRAVENOUS

## 2012-11-18 MED ORDER — HYDROMORPHONE HCL PF 1 MG/ML IJ SOLN
0.2500 mg | INTRAMUSCULAR | Status: DC | PRN
  Administered 2012-11-18 (×4): 0.5 mg via INTRAVENOUS

## 2012-11-18 MED ORDER — ONDANSETRON HCL 4 MG PO TABS: 4.0000 mg | ORAL_TABLET | Freq: Four times a day (QID) | ORAL | Status: DC | PRN

## 2012-11-18 MED ORDER — LORAZEPAM 0.5 MG PO TABS: 0.5000 mg | ORAL_TABLET | Freq: Four times a day (QID) | ORAL | Status: DC | PRN

## 2012-11-18 MED ORDER — EPHEDRINE SULFATE 50 MG/ML IJ SOLN
INTRAMUSCULAR | Status: DC | PRN
  Administered 2012-11-18 (×2): 10 mg via INTRAVENOUS

## 2012-11-18 SURGICAL SUPPLY — 58 items
APPLIER CLIP 11 MED OPEN (CLIP) ×2
APPLIER CLIP 9.375 MED OPEN (MISCELLANEOUS) ×2
BANDAGE ELASTIC 6 VELCRO ST LF (GAUZE/BANDAGES/DRESSINGS) IMPLANT
BIOPATCH RED 1 DISK 7.0 (GAUZE/BANDAGES/DRESSINGS) ×4 IMPLANT
BLADE HEX COATED 2.75 (ELECTRODE) ×2 IMPLANT
BLADE SURG 10 STRL SS (BLADE) ×2 IMPLANT
BLADE SURG 15 STRL LF DISP TIS (BLADE) ×1 IMPLANT
BLADE SURG 15 STRL SS (BLADE) ×1
BLADE SURG ROTATE 9660 (MISCELLANEOUS) IMPLANT
CANISTER SUCTION 1200CC (MISCELLANEOUS) ×2 IMPLANT
CHLORAPREP W/TINT 26ML (MISCELLANEOUS) ×4 IMPLANT
CLIP APPLIE 11 MED OPEN (CLIP) ×1 IMPLANT
CLIP APPLIE 9.375 MED OPEN (MISCELLANEOUS) ×1 IMPLANT
CLOTH BEACON ORANGE TIMEOUT ST (SAFETY) ×2 IMPLANT
COVER MAYO STAND STRL (DRAPES) ×2 IMPLANT
COVER TABLE BACK 60X90 (DRAPES) ×2 IMPLANT
DECANTER SPIKE VIAL GLASS SM (MISCELLANEOUS) ×2 IMPLANT
DRAIN CHANNEL 19F RND (DRAIN) ×4 IMPLANT
DRAIN HEMOVAC 1/8 X 5 (WOUND CARE) IMPLANT
DRAPE LAPAROSCOPIC ABDOMINAL (DRAPES) IMPLANT
DRAPE SURG 17X23 STRL (DRAPES) IMPLANT
DRAPE U-SHAPE 76X120 STRL (DRAPES) IMPLANT
DRAPE UTILITY XL STRL (DRAPES) ×2 IMPLANT
ELECT BLADE 4.0 EZ CLEAN MEGAD (MISCELLANEOUS) ×2
ELECT REM PT RETURN 9FT ADLT (ELECTROSURGICAL) ×2
ELECTRODE BLDE 4.0 EZ CLN MEGD (MISCELLANEOUS) ×1 IMPLANT
ELECTRODE REM PT RTRN 9FT ADLT (ELECTROSURGICAL) ×1 IMPLANT
EVACUATOR SILICONE 100CC (DRAIN) ×4 IMPLANT
GAUZE SPONGE 4X4 12PLY STRL LF (GAUZE/BANDAGES/DRESSINGS) IMPLANT
GLOVE BIO SURGEON STRL SZ7 (GLOVE) ×2 IMPLANT
GLOVE BIO SURGEON STRL SZ7.5 (GLOVE) ×2 IMPLANT
GLOVE BIOGEL PI IND STRL 7.0 (GLOVE) ×1 IMPLANT
GLOVE BIOGEL PI INDICATOR 7.0 (GLOVE) ×1
GLOVE EUDERMIC 7 POWDERFREE (GLOVE) ×2 IMPLANT
GOWN PREVENTION PLUS XLARGE (GOWN DISPOSABLE) ×6 IMPLANT
NEEDLE HYPO 25X1 1.5 SAFETY (NEEDLE) ×2 IMPLANT
NS IRRIG 1000ML POUR BTL (IV SOLUTION) ×2 IMPLANT
PACK BASIN DAY SURGERY FS (CUSTOM PROCEDURE TRAY) ×2 IMPLANT
PEN SKIN MARKING BROAD TIP (MISCELLANEOUS) IMPLANT
PENCIL BUTTON HOLSTER BLD 10FT (ELECTRODE) ×2 IMPLANT
PIN SAFETY STERILE (MISCELLANEOUS) ×2 IMPLANT
SHEET MEDIUM DRAPE 40X70 STRL (DRAPES) ×2 IMPLANT
SLEEVE SCD COMPRESS KNEE MED (MISCELLANEOUS) ×2 IMPLANT
SPONGE INTESTINAL PEANUT (DISPOSABLE) IMPLANT
SPONGE LAP 18X18 X RAY DECT (DISPOSABLE) ×2 IMPLANT
SPONGE LAP 4X18 X RAY DECT (DISPOSABLE) IMPLANT
SUT ETHILON 2 0 FS 18 (SUTURE) ×4 IMPLANT
SUT MNCRL AB 3-0 PS2 18 (SUTURE) ×8 IMPLANT
SUT MNCRL AB 4-0 PS2 18 (SUTURE) IMPLANT
SUT SILK 2 0 SH (SUTURE) IMPLANT
SUT VIC AB 3-0 SH 27 (SUTURE) ×1
SUT VIC AB 3-0 SH 27X BRD (SUTURE) ×1 IMPLANT
SUT VICRYL 3-0 CR8 SH (SUTURE) ×2 IMPLANT
SYR CONTROL 10ML LL (SYRINGE) ×2 IMPLANT
TOWEL OR 17X24 6PK STRL BLUE (TOWEL DISPOSABLE) ×2 IMPLANT
TOWEL OR NON WOVEN STRL DISP B (DISPOSABLE) ×2 IMPLANT
TUBE CONNECTING 20X1/4 (TUBING) ×2 IMPLANT
YANKAUER SUCT BULB TIP NO VENT (SUCTIONS) ×2 IMPLANT

## 2012-11-18 NOTE — Anesthesia Postprocedure Evaluation (Signed)
  Anesthesia Post-op Note  Patient: Nicole Valentine  Procedure(s) Performed: Procedure(s):  RIGHT MODIFIED MASTECTOMY (Right)  Patient Location: PACU  Anesthesia Type:General  Level of Consciousness: awake, alert  and oriented  Airway and Oxygen Therapy: Patient Spontanous Breathing and Patient connected to nasal cannula oxygen  Post-op Pain: mild  Post-op Assessment: Post-op Vital signs reviewed, Patient's Cardiovascular Status Stable, Respiratory Function Stable, Patent Airway and Pain level controlled  Post-op Vital Signs: stable  Complications: No apparent anesthesia complications

## 2012-11-18 NOTE — Anesthesia Preprocedure Evaluation (Addendum)
Anesthesia Evaluation  Patient identified by MRN, date of birth, ID band Patient awake    Reviewed: Allergy & Precautions  Airway Mallampati: II TM Distance: >3 FB Neck ROM: Full    Dental  (+) Teeth Intact and Dental Advisory Given   Pulmonary  breath sounds clear to auscultation        Cardiovascular Rhythm:Regular Rate:Normal     Neuro/Psych    GI/Hepatic   Endo/Other  Hypothyroidism   Renal/GU      Musculoskeletal   Abdominal   Peds  Hematology   Anesthesia Other Findings   Reproductive/Obstetrics                          Anesthesia Physical Anesthesia Plan  ASA: III  Anesthesia Plan: General   Post-op Pain Management:    Induction: Intravenous  Airway Management Planned: LMA  Additional Equipment:   Intra-op Plan:   Post-operative Plan: Extubation in OR  Informed Consent: I have reviewed the patients History and Physical, chart, labs and discussed the procedure including the risks, benefits and alternatives for the proposed anesthesia with the patient or authorized representative who has indicated his/her understanding and acceptance.   Dental advisory given  Plan Discussed with: CRNA and Anesthesiologist  Anesthesia Plan Comments: (R. Breast Ca, S/P chemotherapy with pancytopenia, now resolver Family history of awareness under anesthesia  Plan GA with LMA  Kipp Brood, MD)        Anesthesia Quick Evaluation

## 2012-11-18 NOTE — Transfer of Care (Signed)
Immediate Anesthesia Transfer of Care Note  Patient: Nicole Valentine  Procedure(s) Performed: Procedure(s):  RIGHT MODIFIED MASTECTOMY (Right)  Patient Location: PACU  Anesthesia Type:General  Level of Consciousness: awake, alert , oriented and patient cooperative  Airway & Oxygen Therapy: Patient Spontanous Breathing and Patient connected to face mask oxygen  Post-op Assessment: Report given to PACU RN and Post -op Vital signs reviewed and stable  Post vital signs: Reviewed and stable  Complications: No apparent anesthesia complications

## 2012-11-18 NOTE — H&P (Signed)
  HPI: this patient has completed her neoadjuvant chemotherapy and presents today for a right modified radical mastectomy. She plans to deferred her reconstructions since she will have radiation therapy after surgery.she is planning an elective prophylactic left mastectomy when she has reconstruction. She currently is asymptomatic and feels well. She is no longer able to palpate a mass in her breast for her cancer.   Past history family history review of systems etc. Are all in the electronic medical record, not redictated to this note EXAM:  VITAL SIGNS:  BP 118/76  Pulse 66  Temp(Src) 97.6 F (36.4 C) (Oral)  Resp 18  Ht 5' 7.5" (1.715 m)  Wt 178 lb (80.74 kg)  BMI 27.45 kg/m2  GENERAL:  The patient is alert, oriented, and generally healthy-appearing, NAD. Mood and affect are normal.  HEENT:  The head is normocephalic, the eyes nonicteric, the pupils were round regular and equal. EOMs are normal. Pharynx normal. Dentition good.  NECK:  The neck is supple and there are no masses or thyromegaly.  LUNGS:  Normal respirations and clear to auscultation.  HEART:  Regular rhythm, with no murmurs rubs or gallops. Pulses are intact carotid dorsalis pedis and posterior tibial. No significant varicosities are noted.  BREASTS:  breasts symmetric bilaterally. There is no residual palpable mass in either breast. Nontender.  LYMPHATICS: There is no axillary supraclavicular adenopathy on either side noted.  ABDOMEN:  Soft, flat, and nontender. No masses or organomegaly is noted. No hernias are noted. Bowel sounds are normal.  EXTREMITIES:  Good range of motion, no edema.  IMP: triple-negative stage II right breast cancer, excellent clinical response to neoadjuvant chemotherapy  PLAN: right modified mastectomy. I again discussed the indications risks and complications with the patient and all questions have been answered. I have marked the right breast as the operative side.

## 2012-11-18 NOTE — Anesthesia Procedure Notes (Signed)
Procedure Name: LMA Insertion Date/Time: 11/18/2012 8:50 AM Performed by: Uno Esau D Pre-anesthesia Checklist: Patient identified, Emergency Drugs available, Suction available and Patient being monitored Patient Re-evaluated:Patient Re-evaluated prior to inductionOxygen Delivery Method: Circle System Utilized Preoxygenation: Pre-oxygenation with 100% oxygen Intubation Type: IV induction Ventilation: Mask ventilation without difficulty LMA: LMA inserted LMA Size: 4.0 Number of attempts: 1 Airway Equipment and Method: bite block Placement Confirmation: positive ETCO2 Tube secured with: Tape Dental Injury: Teeth and Oropharynx as per pre-operative assessment

## 2012-11-18 NOTE — Op Note (Signed)
Nicole Valentine May 18, 1966 [id number] 10/18/2012  Preoperative diagnosis: Right breast cancer, clinical stage II, status post neoadjuvant chemotherapy  Postoperative diagnosis: Same  Procedure: Right modified radical mastectomy  Surgeon: Currie Paris  Assistant surgeon: Dr. Chevis Pretty  Anesthesia:General  Clinical History and Indications:The patient is seen in the holding area and we reviewed the plans for the procedure as noted above. We reviewed the risks and complications a final time. She had no further questions. I marked theright side as the operative side.  Description of Procedure: The patient was taken to the operating room. After satisfactory general anesthesia was obtained the timeout was done.   I outlined an elliptical incision and marked the inframammary fold and midline. The incision was made. The usual skin flaps were raised. I went to the clavicle superiorly and sternum medially and towards the latissimus laterally.     The inferior flap was then made going to the inframammary fold and out to the latissimus. The breast was then removed from the pectoralis starting medially and working laterally. When I got to the lateral aspect of the pectoralis major muscle I opened the clavipectoral fascia.   The clavipectoral fascia was then opened and the axilla entered. The axillary vein was identified. I then stripped out the axillary contents working from superior to inferior and medial to lateral. The long thoracic and thoracodorsal nerves were identified and preserved. The second intercostal nerve was divided as it exited the chest wall. Once I had gottent the axillary contents out, the lateral attachments to the latissimus were divided. The specimen was oriented for pathology.  I spent several minutes irrigating and making sure everything was dry. I then placed a 86 [place] Blake drain through a small incision in the inferior flap and laid along the pectoralis muscle. It was  secured with a 2-0 nylon suture. A second 51 [place] Blake drain was placed into the axilla through a separate incision.  Another irrigation and check for hemostasis was made. Everything appeared to be dry. Incision was then closed with interrupted 3-0 Monocryl subcuticular sutures plus Dermabond.  The patient tolerated the procedure well. There were no operative complications. All counts were correct. Estimated blood loss was less than 50 cc.. Sterile dressings were applied and the patient taken to the PACU in satisfactory condition.  Currie Paris, MD, FACS 11/18/2012 10:19 AM

## 2012-11-19 ENCOUNTER — Encounter (HOSPITAL_BASED_OUTPATIENT_CLINIC_OR_DEPARTMENT_OTHER): Payer: Self-pay | Admitting: Surgery

## 2012-11-19 ENCOUNTER — Telehealth (INDEPENDENT_AMBULATORY_CARE_PROVIDER_SITE_OTHER): Payer: Self-pay | Admitting: Surgery

## 2012-11-19 MED ORDER — OXYCODONE-ACETAMINOPHEN 5-325 MG PO TABS: 1.0000 | ORAL_TABLET | ORAL | Status: DC | PRN

## 2012-11-19 NOTE — Progress Notes (Signed)
1 Day Post-Op   Assessment: s/p Procedure(s):  RIGHT MODIFIED MASTECTOMY Patient Active Problem List   Diagnosis Date Noted  . Breast cancer, right, upper outer 04/08/2012    Priority: High    Doing well post mastectomy  Plan: Discharge  Subjective: Minimal pain, dressed and ready to go home. Knows JP management  Objective: Vital signs in last 24 hours: Temp:  [97.7 F (36.5 C)-98.2 F (36.8 C)] 98.2 F (36.8 C) (07/01 0600) Pulse Rate:  [62-93] 62 (07/01 0600) Resp:  [13-18] 18 (07/01 0600) BP: (116-151)/(70-84) 142/75 mmHg (07/01 0600) SpO2:  [91 %-100 %] 100 % (07/01 0600) Weight:  [179 lb 8 oz (81.421 kg)] 179 lb 8 oz (81.421 kg) (06/30 0747)   Intake/Output from previous day: 06/30 0701 - 07/01 0700 In: 2700 [P.O.:1200; I.V.:1500] Out: 2433.5 [Urine:2050; Emesis/NG output:60; Drains:323.5]  General appearance: alert, cooperative and no distress Resp: clear to auscultation bilaterally  Incision: JP' red gatorade, no hematoma  Lab Results:  No results found for this basename: WBC, HGB, HCT, PLT,  in the last 72 hours BMET No results found for this basename: NA, K, CL, CO2, GLUCOSE, BUN, CREATININE, CALCIUM,  in the last 72 hours  MEDS, Scheduled . chlorhexidine  1 application Topical Once  . chlorhexidine  1 application Topical Once  . heparin  5,000 Units Subcutaneous Q8H    Studies/Results: No results found.    LOS: 1 day     Currie Paris, MD, [facility], [place] [phone]   11/19/2012 7:11 AM

## 2012-11-19 NOTE — Telephone Encounter (Signed)
I called her to give her her pathology report which showed a complete pathologic response to her chemotherapy, no residual cancer in the breast and all lymph nodes negative for metastatic disease. She notes she is doing well postoperatively with no particular problems.

## 2012-11-20 ENCOUNTER — Telehealth (INDEPENDENT_AMBULATORY_CARE_PROVIDER_SITE_OTHER): Payer: Self-pay | Admitting: General Surgery

## 2012-11-20 NOTE — Telephone Encounter (Signed)
Patient called questioning if it was ok for her to take a shower or bath < than 48 hours after rt mastectomy ? She states nurse told her not to take shower or bath . I advised her to take a sponge bath until seeing Dr Jamey Ripa on 11/25/12 . She also ask if she could take percocet and ativan which was prescribed for her I advised her to take as directed by Dr.

## 2012-11-24 ENCOUNTER — Telehealth (INDEPENDENT_AMBULATORY_CARE_PROVIDER_SITE_OTHER): Payer: Self-pay | Admitting: Surgery

## 2012-11-24 MED ORDER — HYDROCODONE-ACETAMINOPHEN 5-325 MG PO TABS: 1.0000 | ORAL_TABLET | Freq: Four times a day (QID) | ORAL | Status: DC | PRN

## 2012-11-24 MED ORDER — NAPROXEN 500 MG PO TABS: 500.0000 mg | ORAL_TABLET | Freq: Two times a day (BID) | ORAL | Status: DC

## 2012-11-24 NOTE — Telephone Encounter (Signed)
Patient status post mastectomy 11/18/2012.  Notes a lot of pain at one of the exit drain sites.  Trying to cover with gauze.  Trying to wean off Percocet and do Tylenol only.  In doing this for several days.  Yesterday miserable.  Called this morning hoping to get more Percocet.  Due to see Dr. Russella Dar tomorrow morning.  No bleeding.  No change in drainage.  No leaking around drain.  I recommended ice pack.  Naproxen 500 mg by mouth twice a day.  I offered switching hydrocodone since I cannot call in Oxycodone over the holiday weekend.  She agreed.  I called prescriptions for hydrocodone and naproxen to her local pharmacy [facility]  [phone]

## 2012-11-25 ENCOUNTER — Encounter (INDEPENDENT_AMBULATORY_CARE_PROVIDER_SITE_OTHER): Payer: Self-pay | Admitting: Surgery

## 2012-11-25 ENCOUNTER — Ambulatory Visit (INDEPENDENT_AMBULATORY_CARE_PROVIDER_SITE_OTHER): Payer: BC Managed Care – PPO | Admitting: Surgery

## 2012-11-25 VITALS — BP 124/82 | HR 64 | Resp 14 | Ht 67.5 in | Wt 179.6 lb

## 2012-11-25 DIAGNOSIS — C50419 Malignant neoplasm of upper-outer quadrant of unspecified female breast: Secondary | ICD-10-CM

## 2012-11-25 DIAGNOSIS — C50411 Malignant neoplasm of upper-outer quadrant of right female breast: Secondary | ICD-10-CM

## 2012-11-25 NOTE — Progress Notes (Signed)
Nicole Valentine    [id number] 11/25/2012    December 07, 1965   CC:  No chief complaint on file.    HPI:  The patient returns for post op follow-up. She underwent a Right MRM on 11/18/12. Over all she feels that she is doing well. She did have a little leaking from her incision. The right lateral drain still producing 80-90 cc of fairly clear fluid. The left drain is just about quit draining it looks like perhaps residual clot in it.  PE: VITAL SIGNS: There were no vitals taken for this visit.  Breast: The incision is healing nicely and there is no evidence of infection or hematoma.  The axillary drain was left in situ with a sterile dressing. The medial drain is removed since it was not working and was able to get a little old blood from under the medial aspect of the flap.  DATA REVIEWED: Pathology report: Diagnosis Breast, modified radical mastectomy , Right and axillary - FIBROSIS AND SLIGHT INFLAMMATION. - FIBROCYSTIC CHANGES. - NO RESIDUAL CARCINOMA IDENTIFIED. - SIXTEEN BENIGN LYMPH NODES (0/16). Diagnosis Breast, modified radical mastectomy , Right and axillary - FIBROSIS AND SLIGHT INFLAMMATION. - FIBROCYSTIC CHANGES. - NO RESIDUAL CARCINOMA IDENTIFIED. - SIXTEEN BENIGN LYMPH NODES (0/16).  IMPRESSION: Patient doing well. Appears to have a CPR by pathology  PLAN: Her next visit will be in one week as scheduled. If she has more drainage from the incision itself she'll call me. I gave her copy of her pathology report reviewed with her.[name]

## 2012-11-29 ENCOUNTER — Encounter (INDEPENDENT_AMBULATORY_CARE_PROVIDER_SITE_OTHER): Payer: Self-pay | Admitting: Surgery

## 2012-11-29 ENCOUNTER — Telehealth (INDEPENDENT_AMBULATORY_CARE_PROVIDER_SITE_OTHER): Payer: Self-pay

## 2012-11-29 ENCOUNTER — Ambulatory Visit (INDEPENDENT_AMBULATORY_CARE_PROVIDER_SITE_OTHER): Payer: BC Managed Care – PPO | Admitting: Surgery

## 2012-11-29 VITALS — BP 122/72 | Resp 18 | Ht 67.5 in | Wt 180.0 lb

## 2012-11-29 DIAGNOSIS — Z09 Encounter for follow-up examination after completed treatment for conditions other than malignant neoplasm: Secondary | ICD-10-CM

## 2012-11-29 NOTE — Progress Notes (Signed)
She came back because of a little bloody drainage from her incision. I have removed the medial drain because it was not functioning on her last visit. She still has serous drainage from the lateral drain.  On exam she appears to have some fluid medially. There is also some ecchymotic areas of the skin.I aspirated this and got 15 cc of bloody fluid and it does not appear to be any residual fluid or clot. Sterile dressings were applied. She'll come back next week as scheduled.

## 2012-11-29 NOTE — Patient Instructions (Signed)
Continue the same local wound care.

## 2012-11-29 NOTE — Telephone Encounter (Signed)
The pt called reporting bloody drainage from her incision and her drain site is red around it.  She has no fever and she has been using antibiotic cream around it.  She has clear drainage from the drain.  I spoke to Dr Jamey Ripa and he wants to see her today if she can come get worked in around 12:30.  I spoke to the pt and she can be here.  Appointment made.

## 2012-12-03 ENCOUNTER — Encounter (INDEPENDENT_AMBULATORY_CARE_PROVIDER_SITE_OTHER): Payer: Self-pay | Admitting: Surgery

## 2012-12-03 ENCOUNTER — Ambulatory Visit (INDEPENDENT_AMBULATORY_CARE_PROVIDER_SITE_OTHER): Payer: BC Managed Care – PPO | Admitting: Surgery

## 2012-12-03 ENCOUNTER — Encounter (INDEPENDENT_AMBULATORY_CARE_PROVIDER_SITE_OTHER): Payer: BC Managed Care – PPO | Admitting: Surgery

## 2012-12-03 VITALS — BP 136/82 | HR 83 | Temp 98.0°F | Resp 18 | Ht 67.5 in | Wt 182.0 lb

## 2012-12-03 DIAGNOSIS — Z09 Encounter for follow-up examination after completed treatment for conditions other than malignant neoplasm: Secondary | ICD-10-CM

## 2012-12-03 NOTE — Progress Notes (Signed)
She as more fluid medially and too much in the drain. Aspirated 12 cc bloody fluid. Will see again Friday

## 2012-12-03 NOTE — Patient Instructions (Signed)
See me again Friday

## 2012-12-04 ENCOUNTER — Encounter: Payer: Self-pay | Admitting: *Deleted

## 2012-12-04 NOTE — Progress Notes (Addendum)
Location of Breast Cancer:right  UOQ large mass    Histology per Pathology Report: 04/04/12 right breast =Invasive Ductal Carcinoma,lymph node bx,axillary=Metastatic Carcinoma in 1/1 lymph node with exracapsular extension ,Grade III Receptor Status: ER(-,)PR (-,) Her 2 neu (-)  Did patient present with symptoms (if so, please note symptoms) or was this found on screening mammography?: patient found right mass UOQ / then had a mammogram   Past/Anticipated interventions by surgeon, if any:: 11/18/12:RIGHTBreast, modified radical mastectomy , Right and axillary- FIBROSIS AND SLIGHT INFLAMMATION.- FIBROCYSTIC CHANGES.- NO RESIDUAL CARCINOMA IDENTIFIED.SIXTEEN BENIGN LYMPH NODES (0/16).Dr. Cyndia Bent, has jp drain  Still in, MD aspirated 12cc bloody fluid, appt 12/06/12 check up, right lateral jp drain, serosangous drained  50cc drained 12/04/12,  Porta cath insertion  04/22/12 Dr. Cyndia Bent   Past/Anticipated interventions by medical oncology, if any: Chemotherapy : [facility]  Dose dense y 2 Neulasta x 4 cycles, followed by dose dense Taxotere q 2 weeks for total 4 cycles, began chemotherapy 04/26/12, taxotere started 06/21/12, d/c for bad hand/foot reaction, then started on Gemzar/Carbo q 2 weeks from 07/05/12-10/11/12, current therapy  Cycle 6 gemzar/Carbo 11/10/12  Lymphedema issues, if any:  Yes right arm, numbness under arm, tingling fingers,  Pain issues, if any: right arm tenderness, where jp drain pulls , sensitivity,   SAFETY ISSUES:/  Prior radiation? no  Pacemaker/ICD? no  Possible current pregnancy?no  Is the patient on methotrexate? no  Current Complaints / other details: Married, 4 children,   Maternal grandfather lung ca,(50's) prostate cancer,(90's) maternal uncle melanoma,treated 2x,  and now liver cancer, 2nd female maternal cousin brain tumor non malignant, maternal  Great grandmother liver cancer,great aunts  x2 throat cancer,  Mother in law right neck cancer new, had breast  and thyroid cancer 20 years ago, needs radiation, in hospital  Had seizure 12/03/12 hx epilepsy in hospital  Allergies: Almonds=lips break out and itches  Katherene Ponto, Gloriann Loan, RN 12/04/2012,10:45 AM

## 2012-12-05 ENCOUNTER — Ambulatory Visit
Admission: RE | Admit: 2012-12-05 | Discharge: 2012-12-05 | Disposition: A | Discharge status: Home / Self Care | Payer: BC Managed Care – PPO | Source: Ambulatory Visit | Attending: Radiation Oncology | Admitting: Radiation Oncology

## 2012-12-05 ENCOUNTER — Encounter: Payer: Self-pay | Admitting: *Deleted

## 2012-12-05 DIAGNOSIS — C50411 Malignant neoplasm of upper-outer quadrant of right female breast: Secondary | ICD-10-CM

## 2012-12-05 DIAGNOSIS — C50419 Malignant neoplasm of upper-outer quadrant of unspecified female breast: Secondary | ICD-10-CM | POA: Insufficient documentation

## 2012-12-05 DIAGNOSIS — C50919 Malignant neoplasm of unspecified site of unspecified female breast: Secondary | ICD-10-CM | POA: Insufficient documentation

## 2012-12-05 NOTE — Progress Notes (Signed)
Please see the Nurse Progress Note in the MD Initial Consult Encounter for this patient. 

## 2012-12-05 NOTE — Progress Notes (Signed)
Department of Radiation Oncology  Phone:  [phone] Fax:        [phone]   Name: Nicole Valentine MRN: [id number]  DOB: [birth year]  Date: 12/05/2012  Follow Up Visit Note  Diagnosis: Stage III triple negative right breast cancer (pathologic complete response after mastecomy and ALND on 11/18/12)  Interval History: Nicole Valentine presents today for routine followup.  She tolerated chemotherapy relatively well. Her chemotherapy course was complicated by hand-foot reaction to Taxotere. She finished up chemotherapy and had response to treatment after her second or third round. She underwent a right mastectomy and axillary lymph node dissection on 11/18/2012 which showed a pathologic complete response. No residual carcinoma was identified in the breast or 16 lymph nodes. Her life has been complicated by an various medical issues for her mother and her mother-in-law. Specifically her mother-in-law has been diagnosed with recurrent thyroid cancer. In addition her own mother was diagnosed with epilepsy and has been having seizures and appointments related to that. She is still has one drain in. It 50 cc were drained July 16. She is to see Dr. Jamey Ripa at the end of the week. She would like to coordinate her appointments at the same time as her mother-in-law's appointments for radiation to minimize their travel.  Allergies:  Allergies  Allergen Reactions  . Almond Meal     *Pt states when she eats almonds, her lips "get itchy."    Medications:  Current Outpatient Prescriptions  Medication Sig Dispense Refill  . calcium carbonate (OS-CAL - DOSED IN MG OF ELEMENTAL CALCIUM) 1250 MG tablet Take 1 tablet by mouth daily.      . Cholecalciferol (VITAMIN D-3 PO) Take 2,000 Int'l Units by mouth.      . Cyanocobalamin (VITAMIN B-12 PO) Take 5,000 mg by mouth daily.      . Ferrous Sulfate Dried (SLOW RELEASE IRON) 45 MG TBCR Take 65 mg by mouth daily.       [name] levothyroxine (SYNTHROID, LEVOTHROID) 200 MCG  tablet 150 mcg.       . naproxen (NAPROSYN) 500 MG tablet Take 1 tablet (500 mg total) by mouth 2 (two) times daily with a meal.  40 tablet  1   No current facility-administered medications for this encounter.    Physical Exam:  There were no vitals filed for this visit. she is a pleasant female in no distress sitting comfortably examining table. She still has a drain in place. Her scar is healing well.  IMPRESSION: Nicole Valentine is a 47 y.o. female with a pathologic complete response to neoadjuvant chemotherapy status post mastectomy with an initially positive lymph node  PLAN:  I talked to [place] today about indications for radiation. We discussed that radiation decisions are made based on her original stage at diagnosis rather than her pathologic complete response. Her initial MRI showed multiple abnormal positive right axillary lymph nodes. I think it's prudent to proceed forward with chest wall and comprehensive nodal radiation. We discussed the process of simulation the placement tattoos. We discussed 6 weeks of treatment as an outpatient. We discussed the possibility of rib and lung damage. We discussed increased complications with reconstruction. She has already met with plastic surgery. She is going to see Dr. Jamey Ripa for an assessment of her drains. I would be happy to see her mother-in-law or she can be seen by Dr. Basilio Cairo on the 29th at she is scheduled and we can coordinate appointment times. She signed informed consent. I encouraged her to call us when her drains  have been pulled to schedule her simulation.    Lurline Hare, MD

## 2012-12-06 ENCOUNTER — Encounter (INDEPENDENT_AMBULATORY_CARE_PROVIDER_SITE_OTHER): Payer: Self-pay | Admitting: Surgery

## 2012-12-06 ENCOUNTER — Ambulatory Visit (INDEPENDENT_AMBULATORY_CARE_PROVIDER_SITE_OTHER): Payer: BC Managed Care – PPO | Admitting: Surgery

## 2012-12-06 VITALS — BP 122/74 | HR 70 | Resp 14 | Ht 67.5 in | Wt 179.4 lb

## 2012-12-06 DIAGNOSIS — Z09 Encounter for follow-up examination after completed treatment for conditions other than malignant neoplasm: Secondary | ICD-10-CM

## 2012-12-06 NOTE — Patient Instructions (Signed)
See one of the nursing staff next week to see if they can get your drain out.

## 2012-12-06 NOTE — Progress Notes (Signed)
She as more fluid medially and too much in the drain (45cc). Aspirated 5 cc bloody fluid. Will have the nurse see next week to pull the drain. Can start radiation when drain out

## 2012-12-09 ENCOUNTER — Other Ambulatory Visit: Payer: Self-pay | Admitting: Medical Oncology

## 2012-12-09 ENCOUNTER — Telehealth (INDEPENDENT_AMBULATORY_CARE_PROVIDER_SITE_OTHER): Payer: Self-pay

## 2012-12-09 NOTE — Telephone Encounter (Signed)
Patient asking should she have a sleeve . Advised her Dr. Jamey Ripa would assess  her on her next visit and make that determination then.

## 2012-12-09 NOTE — Progress Notes (Signed)
Pt called needing to have PAC flushed. Onc tx sent. Schedulers to notify pt with appts. LVMOM with patient to expect call from scheduling and for pt to call office with any questions or concerns.

## 2012-12-11 ENCOUNTER — Ambulatory Visit (HOSPITAL_BASED_OUTPATIENT_CLINIC_OR_DEPARTMENT_OTHER): Payer: BC Managed Care – PPO

## 2012-12-11 ENCOUNTER — Telehealth: Payer: Self-pay | Admitting: *Deleted

## 2012-12-11 VITALS — BP 115/78 | HR 86 | Temp 97.7°F | Resp 16

## 2012-12-11 DIAGNOSIS — Z452 Encounter for adjustment and management of vascular access device: Secondary | ICD-10-CM

## 2012-12-11 DIAGNOSIS — C50419 Malignant neoplasm of upper-outer quadrant of unspecified female breast: Secondary | ICD-10-CM

## 2012-12-11 DIAGNOSIS — C50919 Malignant neoplasm of unspecified site of unspecified female breast: Secondary | ICD-10-CM

## 2012-12-11 MED ORDER — SODIUM CHLORIDE 0.9 % IJ SOLN
10.0000 mL | INTRAMUSCULAR | Status: DC | PRN
  Administered 2012-12-11: 10 mL via INTRAVENOUS
  Filled 2012-12-11: qty 10

## 2012-12-11 MED ORDER — HEPARIN SOD (PORK) LOCK FLUSH 100 UNIT/ML IV SOLN
500.0000 [IU] | Freq: Once | INTRAVENOUS | Status: AC
  Administered 2012-12-11: 500 [IU] via INTRAVENOUS
  Filled 2012-12-11: qty 5

## 2012-12-11 NOTE — Telephone Encounter (Signed)
sw pt gv appt for flush today @ 2:30pm. i made the flush nurse Thu aware. Pt is also aware of her other flush appts and will get a print out today...td

## 2012-12-11 NOTE — Patient Instructions (Addendum)
Call for problems.

## 2012-12-12 ENCOUNTER — Ambulatory Visit (INDEPENDENT_AMBULATORY_CARE_PROVIDER_SITE_OTHER): Payer: BC Managed Care – PPO

## 2012-12-12 ENCOUNTER — Other Ambulatory Visit (HOSPITAL_COMMUNITY): Payer: Self-pay | Admitting: Oncology

## 2012-12-12 DIAGNOSIS — Z4803 Encounter for change or removal of drains: Secondary | ICD-10-CM

## 2012-12-12 DIAGNOSIS — Z4889 Encounter for other specified surgical aftercare: Secondary | ICD-10-CM

## 2012-12-12 NOTE — Progress Notes (Signed)
Patient drains is 30 cc or greater . Denies pain , fever  No odor redness noted at  drian site. T 98.4 p 68 r 18 . Patient advised to call on Monday or Tuesday to inform us of amount of drainage . Drain is to be less than 30 cc or less before drain can be removed . Patient verbalized understanding

## 2012-12-18 ENCOUNTER — Encounter (INDEPENDENT_AMBULATORY_CARE_PROVIDER_SITE_OTHER): Payer: Self-pay | Admitting: General Surgery

## 2012-12-18 ENCOUNTER — Ambulatory Visit (INDEPENDENT_AMBULATORY_CARE_PROVIDER_SITE_OTHER): Payer: BC Managed Care – PPO | Admitting: General Surgery

## 2012-12-18 VITALS — BP 116/76 | HR 72 | Temp 97.8°F | Resp 14 | Ht 67.5 in | Wt 180.6 lb

## 2012-12-18 DIAGNOSIS — Q7959 Other congenital malformations of abdominal wall: Secondary | ICD-10-CM

## 2012-12-18 DIAGNOSIS — C50419 Malignant neoplasm of upper-outer quadrant of unspecified female breast: Secondary | ICD-10-CM

## 2012-12-18 DIAGNOSIS — C50411 Malignant neoplasm of upper-outer quadrant of right female breast: Secondary | ICD-10-CM

## 2012-12-18 NOTE — Assessment & Plan Note (Signed)
Patient has appointment on Friday with Dr. Carolynne Edouard to assess for possible drain removal.

## 2012-12-18 NOTE — Patient Instructions (Signed)
Take 600 mg ibuprofen 3 times/day for 4-7 days with food.  Use heating pad or ice pack 2 x/day.

## 2012-12-18 NOTE — Progress Notes (Signed)
HISTORY: This 47 year old female who presents with an area of palpable abnormality in her right abdominal wall. She states that it feels like a cord starting right below her rib cage and going down toward her belly button.  She stated that it was very sore and has not been a problem until this morning. She denies any fevers and chills. She does still have her axillary drain in place and it has put out 25 cc since last night. The day before was 15 cc the day before that was 30 cc. She does have any pain in her breast flaps or any redness that her drain site.    EXAM: General:  Alert and oriented.   Incision:  Healing well.  Non tender.  No erythema.  Serous drain output.   Abd.  Cord around 4 mm wide and 3 cm long along rectus sheath.  Tender.      ASSESSMENT AND PLAN:   Breast cancer, right, upper outer Patient has appointment on Friday with Dr. Carolynne Edouard to assess for possible drain removal.      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  Advised to take ibuprofen 600 mg TID for 4-7 days and use heating pad.         Maudry Diego, MD Surgical Oncology, General & Endocrine Surgery [facility]  [name], MD No ref. provider found

## 2012-12-18 NOTE — Assessment & Plan Note (Signed)
This appears to be a superficial vein thrombosis of the superficial epigastric vein or an area of fat necrosis.  Advised to take ibuprofen 600 mg TID for 4-7 days and use heating pad.

## 2012-12-20 ENCOUNTER — Telehealth (INDEPENDENT_AMBULATORY_CARE_PROVIDER_SITE_OTHER): Payer: Self-pay | Admitting: *Deleted

## 2012-12-20 ENCOUNTER — Encounter (INDEPENDENT_AMBULATORY_CARE_PROVIDER_SITE_OTHER): Payer: BC Managed Care – PPO | Admitting: General Surgery

## 2012-12-20 NOTE — Telephone Encounter (Signed)
Patient called in today to report that her drain is still draining more than 30ccs so appt with Dr. Carolynne Edouard cancelled for today since it would not be appropriate to pull drain due to drainage.  Patient agreeable to call back next week when she has had 3 consecutive days of draining less than 30ccs to either be set up for a nurse only or urgent office.

## 2012-12-24 ENCOUNTER — Ambulatory Visit (INDEPENDENT_AMBULATORY_CARE_PROVIDER_SITE_OTHER): Payer: BC Managed Care – PPO | Admitting: Surgery

## 2012-12-24 ENCOUNTER — Encounter (INDEPENDENT_AMBULATORY_CARE_PROVIDER_SITE_OTHER): Payer: Self-pay | Admitting: Surgery

## 2012-12-24 VITALS — BP 138/90 | HR 76 | Temp 98.7°F | Resp 15 | Ht 67.75 in | Wt 182.2 lb

## 2012-12-24 DIAGNOSIS — Z09 Encounter for follow-up examination after completed treatment for conditions other than malignant neoplasm: Secondary | ICD-10-CM

## 2012-12-24 MED ORDER — DOXYCYCLINE HYCLATE 100 MG PO TABS: 100.0000 mg | ORAL_TABLET | Freq: Two times a day (BID) | ORAL | Status: DC

## 2012-12-24 NOTE — Progress Notes (Signed)
S: she has almost stopped draing, < 20 cc per day O: Drain plugged, slight erythema of superior flap, no fluuid accumulation  I: Stable Plan: Drain pulled Will start doxy in case she is trying to develop an infection RTC two weeks

## 2013-01-08 ENCOUNTER — Ambulatory Visit (HOSPITAL_BASED_OUTPATIENT_CLINIC_OR_DEPARTMENT_OTHER): Payer: BC Managed Care – PPO | Admitting: Physician Assistant

## 2013-01-08 ENCOUNTER — Encounter: Payer: Self-pay | Admitting: *Deleted

## 2013-01-08 ENCOUNTER — Ambulatory Visit: Payer: BC Managed Care – PPO | Admitting: Oncology

## 2013-01-08 ENCOUNTER — Telehealth: Payer: Self-pay | Admitting: Oncology

## 2013-01-08 ENCOUNTER — Encounter (INDEPENDENT_AMBULATORY_CARE_PROVIDER_SITE_OTHER): Payer: Self-pay | Admitting: Surgery

## 2013-01-08 ENCOUNTER — Other Ambulatory Visit (HOSPITAL_BASED_OUTPATIENT_CLINIC_OR_DEPARTMENT_OTHER): Payer: BC Managed Care – PPO

## 2013-01-08 ENCOUNTER — Other Ambulatory Visit: Payer: BC Managed Care – PPO | Admitting: Lab

## 2013-01-08 ENCOUNTER — Ambulatory Visit (INDEPENDENT_AMBULATORY_CARE_PROVIDER_SITE_OTHER): Payer: BC Managed Care – PPO | Admitting: Surgery

## 2013-01-08 VITALS — BP 158/88 | HR 76 | Resp 14 | Ht 67.5 in | Wt 182.4 lb

## 2013-01-08 VITALS — BP 124/85 | HR 76 | Temp 98.3°F | Resp 20 | Ht 67.75 in | Wt 182.1 lb

## 2013-01-08 DIAGNOSIS — C50919 Malignant neoplasm of unspecified site of unspecified female breast: Secondary | ICD-10-CM

## 2013-01-08 DIAGNOSIS — Z09 Encounter for follow-up examination after completed treatment for conditions other than malignant neoplasm: Secondary | ICD-10-CM

## 2013-01-08 DIAGNOSIS — C50419 Malignant neoplasm of upper-outer quadrant of unspecified female breast: Secondary | ICD-10-CM

## 2013-01-08 DIAGNOSIS — C50911 Malignant neoplasm of unspecified site of right female breast: Secondary | ICD-10-CM

## 2013-01-08 DIAGNOSIS — C50411 Malignant neoplasm of upper-outer quadrant of right female breast: Secondary | ICD-10-CM

## 2013-01-08 LAB — COMPREHENSIVE METABOLIC PANEL (CC13)
Alkaline Phosphatase: 107 U/L (ref 40–150)
BUN: 15.1 mg/dL (ref 7.0–26.0)
CO2: 30 mEq/L — ABNORMAL HIGH (ref 22–29)
Creatinine: 0.8 mg/dL (ref 0.6–1.1)
Glucose: 102 mg/dl (ref 70–140)
Total Bilirubin: 0.46 mg/dL (ref 0.20–1.20)

## 2013-01-08 LAB — CBC WITH DIFFERENTIAL/PLATELET
Eosinophils Absolute: 0.1 10*3/uL (ref 0.0–0.5)
HCT: 40.4 % (ref 34.8–46.6)
LYMPH%: 33.9 % (ref 14.0–49.7)
MCHC: 35 g/dL (ref 31.5–36.0)
MCV: 103 fL — ABNORMAL HIGH (ref 79.5–101.0)
MONO#: 0.3 10*3/uL (ref 0.1–0.9)
MONO%: 6.1 % (ref 0.0–14.0)
NEUT#: 2.9 10*3/uL (ref 1.5–6.5)
NEUT%: 58.6 % (ref 38.4–76.8)
Platelets: 142 10*3/uL — ABNORMAL LOW (ref 145–400)
RBC: 3.92 10*6/uL (ref 3.70–5.45)
WBC: 4.9 10*3/uL (ref 3.9–10.3)

## 2013-01-08 NOTE — Progress Notes (Signed)
CC;  Chief Complaint  Patient presents with  . Routine Post Op    She returns for followup after mastectomy. She is getting ready to start radiation therapy. She has erythema of the flap and her drain is in for quite a while so we had her on antibiotics and she comes back for recheck today. She feels well is not having any fevers. She does note that she has occasional numbness in the right hand primarily ulnar distribution. Occasionally wakes her up at night. She's actually had some of these symptoms since her first child was born which was 15 years ago. They are not really bothersome but she just wanted to mention it today.  Exam: Vital signs:BP 158/88  Pulse 76  Resp 14  Ht 5' 7.5" (1.715 m)  Wt 182 lb 6.4 oz (82.736 kg)  BMI 28.13 kg/m2 Breasts: The mastectomy site is well healed. She does have a little limitation of range of motion of the right shoulder but she's actually doing pretty well with this. She is not have any evidence of lymphedema.  Impression doing well okay to start radiation therapy; mild neurological symptoms, may need neurological evaluation if do not improve or get worse  Plan: I will see her back in 2 months after she finishes radiation therapy.gave her ABC class information

## 2013-01-08 NOTE — Patient Instructions (Signed)
See me about a month after you finish radiation.       ABC CLASS After Breast Cancer Class  After Breast Cancer Class is a specially designed exercise class to assist you in a safe recovery after having breast cancer surgery.  In this class you will learn how to get back to full function whether your drains were just removed or if you had surgery a month ago.  This one-time class is held the 1st and 3rd Monday of every month from 11:00 a.m. until 12:00 noon at the [facility] located at [facility].  This class is FREE and space is limited.  For more information or to register for the next available class, call [phone].  Class Goals   Understand specific stretches to improve the flexibility of your chest and shoulder.   Learn ways to safely strengthen your upper body and improve your posture.   Understand the warning signs of infection and why you may be at risk for an arm infection.   Learn about Lymphedema and prevention.  **You do not attend this class until after surgery.  Drains must be removed to participate.     Micheline Maze, PT, CLT Dwaine Gale, PT, CLT

## 2013-01-08 NOTE — Progress Notes (Signed)
Nicole Valentine was scheduled to be seen in the office today as a transfer patient from Dr. Milta Deiters service. Unfortunately,  Dr. Darnelle Catalan was not able to be here this afternoon.  She was given the option of seeing me alone, or rescheduling her appointment to a later date. She was very agreeable with rescheduling the appointment after speaking to [facility], our breast navigator.    Accordingly, she will be seen by Dr. Darnelle Catalan on 01/28/2013. There was no charge for the appointment today.  [name], PA-C 01/08/2013

## 2013-01-08 NOTE — Progress Notes (Signed)
Pt r/s to see Dr. Darnelle Catalan on 9/9 at 2:00pm.  Confirmed new appt date and time.  Informed Dr. Darnelle Catalan as well.

## 2013-01-09 ENCOUNTER — Ambulatory Visit
Admit: 2013-01-09 | Discharge: 2013-01-09 | Disposition: A | Discharge status: Home / Self Care | Payer: BC Managed Care – PPO | Attending: Radiation Oncology | Admitting: Radiation Oncology

## 2013-01-09 DIAGNOSIS — L259 Unspecified contact dermatitis, unspecified cause: Secondary | ICD-10-CM | POA: Insufficient documentation

## 2013-01-09 DIAGNOSIS — G5690 Unspecified mononeuropathy of unspecified upper limb: Secondary | ICD-10-CM | POA: Insufficient documentation

## 2013-01-09 DIAGNOSIS — Z9221 Personal history of antineoplastic chemotherapy: Secondary | ICD-10-CM | POA: Insufficient documentation

## 2013-01-09 DIAGNOSIS — C50411 Malignant neoplasm of upper-outer quadrant of right female breast: Secondary | ICD-10-CM

## 2013-01-09 DIAGNOSIS — C50419 Malignant neoplasm of upper-outer quadrant of unspecified female breast: Secondary | ICD-10-CM | POA: Insufficient documentation

## 2013-01-09 DIAGNOSIS — Z510 Encounter for antineoplastic radiation therapy: Secondary | ICD-10-CM | POA: Insufficient documentation

## 2013-01-09 DIAGNOSIS — M7989 Other specified soft tissue disorders: Secondary | ICD-10-CM | POA: Insufficient documentation

## 2013-01-10 ENCOUNTER — Encounter: Payer: Self-pay | Admitting: Radiation Oncology

## 2013-01-10 NOTE — Progress Notes (Signed)
Name: Nicole Valentine   MRN: [id number]  Date:  01/10/2013  DOB: 30-Sep-1965  Status:outpatient    DIAGNOSIS: Breast cancer.  CONSENT VERIFIED: yes   SET UP: Patient is setup supine   IMMOBILIZATION:  The following immobilization was used:Custom Moldable Pillow, breast board.   NARRATIVE: [name] was brought to the CT Simulation planning suite.  Identity was confirmed.  All relevant records and images related to the planned course of therapy were reviewed.  Then, the patient was positioned in a stable reproducible clinical set-up for radiation therapy.  Wires were placed to delineate the clinical extent of breast tissue. A wire was placed on the scar as well.  CT images were obtained.  An isocenter was placed. Skin markings were placed.  The CT images were loaded into the planning software where the target and avoidance structures were contoured.  The radiation prescription was entered and confirmed. The patient was discharged in stable condition and tolerated simulation well.    TREATMENT PLANNING NOTE:  Treatment planning then occurred. I have requested : MLC's, isodose plan, basic dose calculation  I personally designed and supervised the construction of 4 medically necessary complex treatment devices for the protection of critical normal structures including the lungs and contralateral breast as well as the immobilization device which is necessary for set up certainty.

## 2013-01-17 ENCOUNTER — Encounter: Payer: Self-pay | Admitting: *Deleted

## 2013-01-17 ENCOUNTER — Ambulatory Visit
Admit: 2013-01-17 | Discharge: 2013-01-17 | Disposition: A | Discharge status: Home / Self Care | Payer: BC Managed Care – PPO | Attending: Radiation Oncology | Admitting: Radiation Oncology

## 2013-01-17 DIAGNOSIS — C50411 Malignant neoplasm of upper-outer quadrant of right female breast: Secondary | ICD-10-CM

## 2013-01-17 MED ORDER — ALRA NON-METALLIC DEODORANT (RAD-ONC)
1.0000 "application " | Freq: Once | TOPICAL | Status: AC
  Administered 2013-01-17: 1 via TOPICAL

## 2013-01-17 MED ORDER — RADIAPLEXRX EX GEL
Freq: Once | CUTANEOUS | Status: AC
  Administered 2013-01-17: 14:00:00 via TOPICAL

## 2013-01-17 NOTE — Progress Notes (Signed)
Patient education," Radiation therapy and you book, alra deodorant,radiaplex gel, " my business card, flyer on skin products and when to use them, after rad txs daily and in am after shower, diet,pain,fatigue,skin irritation discussed,all questions answered

## 2013-01-21 ENCOUNTER — Encounter: Payer: Self-pay | Admitting: Radiation Oncology

## 2013-01-21 ENCOUNTER — Ambulatory Visit
Admit: 2013-01-21 | Discharge: 2013-01-21 | Disposition: A | Discharge status: Home / Self Care | Payer: BC Managed Care – PPO | Attending: Radiation Oncology | Admitting: Radiation Oncology

## 2013-01-21 VITALS — BP 116/92 | HR 70 | Temp 99.0°F | Resp 20 | Wt 182.3 lb

## 2013-01-21 DIAGNOSIS — C50411 Malignant neoplasm of upper-outer quadrant of right female breast: Secondary | ICD-10-CM

## 2013-01-21 NOTE — Progress Notes (Signed)
Weekly Management Note Current Dose: 1.8  Gy  Projected Dose: 60.4 Gy   Narrative:  The patient presents for routine under treatment assessment.  CBCT/MVCT images/Port film x-rays were reviewed.  The chart was checked. RN education performed. No questions. Bilateral hand tingling and pain. Has woken her up at night. Similar to pain she had after chemo in August. Not every day. Related to position. Right>left. No issues with neck or back pain or bowel/bladder problems.   Physical Findings: Weight: 182 lb 4.8 oz (82.691 kg). Unchanged  Impression:  The patient is tolerating radiation.  Plan:  Continue treatment as planned. Monitor neuropathy for now. ? Chemo vs. Mechanical issues. She will let em know if she experiences back pain or it starts to affect her legs or bowel/bladder.

## 2013-01-21 NOTE — Progress Notes (Signed)
Weekly rad txs rcw   1st tx, patient education done, alra, radiaplex and book on radiation, my business card, , discusses pain, diet, fatigue and skin irritation, , , all questions answered, no c/o pain 2:00 PM

## 2013-01-21 NOTE — Progress Notes (Signed)
  Radiation Oncology         [phone] ________________________________  Name: Nicole Valentine MRN: [id number]  Date: 01/17/2013  DOB: [birth year]  Simulation Verification Note  Status: outpatient  NARRATIVE: The patient was brought to the treatment unit and placed in the planned treatment position. The clinical setup was verified. Then port films were obtained and uploaded to the radiation oncology medical record software.  The treatment beams were carefully compared against the planned radiation fields. The position location and shape of the radiation fields was reviewed. The targeted volume of tissue appears appropriately covered by the radiation beams. Organs at risk appear to be excluded as planned.  Based on my personal review, I approved the simulation verification. The patient's treatment will proceed as planned.  ------------------------------------------------  Lurline Hare, MD

## 2013-01-22 ENCOUNTER — Ambulatory Visit
Admit: 2013-01-22 | Discharge: 2013-01-22 | Disposition: A | Discharge status: Home / Self Care | Payer: BC Managed Care – PPO | Attending: Radiation Oncology | Admitting: Radiation Oncology

## 2013-01-23 ENCOUNTER — Ambulatory Visit
Admit: 2013-01-23 | Discharge: 2013-01-23 | Disposition: A | Discharge status: Home / Self Care | Payer: BC Managed Care – PPO | Attending: Radiation Oncology | Admitting: Radiation Oncology

## 2013-01-24 ENCOUNTER — Ambulatory Visit
Admit: 2013-01-24 | Discharge: 2013-01-24 | Disposition: A | Discharge status: Home / Self Care | Payer: BC Managed Care – PPO | Attending: Radiation Oncology | Admitting: Radiation Oncology

## 2013-01-27 ENCOUNTER — Ambulatory Visit
Admit: 2013-01-27 | Discharge: 2013-01-27 | Disposition: A | Discharge status: Home / Self Care | Payer: BC Managed Care – PPO | Attending: Radiation Oncology | Admitting: Radiation Oncology

## 2013-01-28 ENCOUNTER — Ambulatory Visit
Admit: 2013-01-28 | Discharge: 2013-01-28 | Disposition: A | Discharge status: Home / Self Care | Payer: BC Managed Care – PPO | Attending: Radiation Oncology | Admitting: Radiation Oncology

## 2013-01-28 ENCOUNTER — Ambulatory Visit (HOSPITAL_BASED_OUTPATIENT_CLINIC_OR_DEPARTMENT_OTHER): Payer: BC Managed Care – PPO | Admitting: Oncology

## 2013-01-28 ENCOUNTER — Encounter: Payer: Self-pay | Admitting: Radiation Oncology

## 2013-01-28 ENCOUNTER — Other Ambulatory Visit: Payer: Self-pay | Admitting: Oncology

## 2013-01-28 ENCOUNTER — Telehealth: Payer: Self-pay | Admitting: Oncology

## 2013-01-28 VITALS — BP 123/82 | HR 62 | Temp 97.6°F | Resp 20 | Ht 67.5 in | Wt 181.3 lb

## 2013-01-28 DIAGNOSIS — C50419 Malignant neoplasm of upper-outer quadrant of unspecified female breast: Secondary | ICD-10-CM

## 2013-01-28 DIAGNOSIS — C50411 Malignant neoplasm of upper-outer quadrant of right female breast: Secondary | ICD-10-CM

## 2013-01-28 DIAGNOSIS — C773 Secondary and unspecified malignant neoplasm of axilla and upper limb lymph nodes: Secondary | ICD-10-CM

## 2013-01-28 DIAGNOSIS — C50919 Malignant neoplasm of unspecified site of unspecified female breast: Secondary | ICD-10-CM

## 2013-01-28 NOTE — Progress Notes (Signed)
Weekly Management Note Current Dose:  10.8 Gy  Projected Dose: 60.4 Gy   Narrative:  The patient presents for routine under treatment assessment.  CBCT/MVCT images/Port film x-rays were reviewed.  The chart was checked. Doing well. Meeting with Dr. Darnelle Catalan today. Using radiaplex.  Physical Findings: Weight:  . Unchanged  Impression:  The patient is tolerating radiation.  Plan:  Continue treatment as planned. Continue radiaplex.

## 2013-01-28 NOTE — Progress Notes (Signed)
Patient ID: Nicole Valentine, female   DOB: [birth year], 47 y.o.   MRN: [id number] Patient ID: Nicole Valentine, female   DOB: Feb 02, 1966, 47 y.o.   MRN: [id number] ID: [name] OB: 1965/07/14  MR#: [id number]  CSN#:[id number]  PCP: [name], MD GYN:   SU:  OTHER MD:   HISTORY OF PRESENT ILLNESS: From doctor Khan's intake nodes 11/10/2012:  "She originally palpated a right breast mass. She had a mammogram performed that showed dense breasts bilaterally. Ultrasound of the right breast showed 2.3 x 1.9 cm area of abnormality with multiple abnormal lymph nodes. MRI of the bilateral breasts showed asymmetrical enhancement throughout the right breast consistent with multicentric disease. An ultrasound-guided biopsy performed. The known area of disease measured 1.9 x 1.9 x 2.3 cm. Multiple abnormal positive right axillary lymph nodes were noted. 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 Biopsy of the right axillary lymph node was positive for malignancy with extracapsular extension. Patient was originally seen by Dr. Jamey Ripa Dr. Donnie Coffin and Dr. Michell Heinrich. She has elected to have a right mastectomy eventually and declined biopsies of any other areas within the breast."  Her subsequent history is as detailed below.  INTERVAL HISTORY: [name] returns today for followup of her breast cancer accompanied by her husband Reed Pandy. Since her last visit here she had her definitive surgery and has started her radiation treatments.  REVIEW OF SYSTEMS: She is tolerating radiation remarkably well. She is mildly fatigued and currently takes a little lab in the middle of the afternoon, perhaps 15 minutes. The only other thing he has noticed is sometimes at night depending on her position she gets tingling and pain in the right arm. She "shakes it off" and it goes back to normal. She has not noted any lymphedema. She has an elliptical at  home, but has not gone back to it since early this year, when she was undergoing chemotherapy. A detailed review of systems was otherwise noncontributory  PAST MEDICAL HISTORY: Past Medical History  Diagnosis Date  . Contact lens/glasses fitting     wears contacts or glasses  . Complication of anesthesia     her father and uncle both had very difficult time waking up-was  something they told them may be hereditory. she will find out.  . Breast cancer dx'd 04-10-12-rt  . Thyroid disease   . Hypothyroidism   . Allergy     almond =itchy lips    PAST SURGICAL HISTORY: Past Surgical History  Procedure Laterality Date  . Wisdom tooth extraction    . Portacath placement  04/22/2012    Procedure: INSERTION PORT-A-CATH;  Surgeon: Currie Paris, MD;  Location: [facility];  Service: General;  Laterality: Left;  [name] Modified mastectomy Right 11/18/2012    Procedure:  RIGHT MODIFIED MASTECTOMY;  Surgeon: Currie Paris, MD;  Location: [facility];  Service: General;  Laterality: Right;  . Jp drain      right breast    FAMILY HISTORY Family History  Problem Relation Age of Onset  . Lung cancer Maternal Grandfather 46    smoker  . Prostate cancer Maternal Grandfather 90  . Throat cancer Other     Great Aunt x 2  . Liver cancer Other     Maternal Great Grandmother  . Melanoma Maternal Uncle 81  . Brain cancer Cousin 11    non-malignant   the patient's parents are living, in their  early 57s. The patient has 2 brothers, no sisters. There is no history of breast or ovarian cancer in the family.  GYNECOLOGIC HISTORY:  Menarche age 41, first live birth age 72. She is GX P4. Let [place] Mr. period was at the time of chemotherapy, December 2013. She is having some hot flashes at present.  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 Reed Pandy is a Psychologist, occupational. They attend the [employer].    ADVANCED DIRECTIVES: Not in  place   HEALTH MAINTENANCE: History  Substance Use Topics  . Smoking status: Never Smoker   . Smokeless tobacco: Never Used  . Alcohol Use: No     Colonoscopy: -  PAP: Per Dr. Katrinka Blazing  Bone density: -  Lipid panel:  Allergies  Allergen Reactions  . Almond Meal     *Pt states when she eats almonds, her lips "get itchy."    Current Outpatient Prescriptions  Medication Sig Dispense Refill  . calcium carbonate (OS-CAL - DOSED IN MG OF ELEMENTAL CALCIUM) 1250 MG tablet Take 1 tablet by mouth daily.      . Cholecalciferol (VITAMIN D-3 PO) Take 2,000 Int'l Units by mouth.      . Cyanocobalamin (VITAMIN B-12 PO) Take 5,000 mg by mouth daily.      [name] doxycycline (VIBRA-TABS) 100 MG tablet Take 1 tablet (100 mg total) by mouth 2 (two) times daily.  14 tablet  2  . Ferrous Sulfate Dried (SLOW RELEASE IRON) 45 MG TBCR Take 65 mg by mouth daily.       . hyaluronate sodium (RADIAPLEXRX) GEL Apply 1 application topically 2 (two) times daily. Apply to affected area,, after rad txs bid      . levothyroxine (SYNTHROID, LEVOTHROID) 200 MCG tablet 150 mcg.       . naproxen (NAPROSYN) 500 MG tablet Take 1 tablet (500 mg total) by mouth 2 (two) times daily with a meal.  40 tablet  1  . non-metallic deodorant (ALRA) MISC Apply 1 application topically daily. Apply after rad txs daily       No current facility-administered medications for this visit.    OBJECTIVE: Middle-aged white woman in no acute distress There were no vitals filed for this visit.   There is no weight on file to calculate BMI.    ECOG FS:1 - Symptomatic but completely ambulatory  Sclerae unicteric Oropharynx clear No cervical or and specifically no right supraclavicular adenopathy Lungs no rales or rhonchi Heart regular rate and rhythm Abd benign MSK no focal spinal tenderness, no peripheral edema Neuro: non-focal, well-oriented, appropriate affect Breasts: The right breast is status post mastectomy; the incision has healed  very nicely. There is no evidence of local recurrence. The skin is minimally erythematous, with no desquamation. The right axillae is benign. The left breast is unremarkable.  LAB RESULTS:  CMP     Component Value Date/Time   NA 145 01/08/2013 1444   NA 142 11/15/2012 1215   K 4.1 01/08/2013 1444   K 3.7 11/15/2012 1215   CL 104 11/15/2012 1215   CL 101 10/18/2012 0859   CO2 30* 01/08/2013 1444   CO2 30 11/15/2012 1215   GLUCOSE 102 01/08/2013 1444   GLUCOSE 104* 11/15/2012 1215   GLUCOSE 83 10/18/2012 0859   BUN 15.1 01/08/2013 1444   BUN 8 11/15/2012 1215   CREATININE 0.8 01/08/2013 1444   CREATININE 0.67 11/15/2012 1215   CALCIUM 9.3 01/08/2013 1444  CALCIUM 9.3 11/15/2012 1215   PROT 7.0 01/08/2013 1444   ALBUMIN 3.6 01/08/2013 1444   AST 19 01/08/2013 1444   ALT 20 01/08/2013 1444   ALKPHOS 107 01/08/2013 1444   BILITOT 0.46 01/08/2013 1444   GFRNONAA >90 11/15/2012 1215   GFRAA >90 11/15/2012 1215    I No results found for this basename: SPEP,  UPEP,   kappa and lambda light chains    Lab Results  Component Value Date   WBC 4.9 01/08/2013   NEUTROABS 2.9 01/08/2013   HGB 14.1 01/08/2013   HCT 40.4 01/08/2013   MCV 103.0* 01/08/2013   PLT 142* 01/08/2013      Chemistry      Component Value Date/Time   NA 145 01/08/2013 1444   NA 142 11/15/2012 1215   K 4.1 01/08/2013 1444   K 3.7 11/15/2012 1215   CL 104 11/15/2012 1215   CL 101 10/18/2012 0859   CO2 30* 01/08/2013 1444   CO2 30 11/15/2012 1215   BUN 15.1 01/08/2013 1444   BUN 8 11/15/2012 1215   CREATININE 0.8 01/08/2013 1444   CREATININE 0.67 11/15/2012 1215      Component Value Date/Time   CALCIUM 9.3 01/08/2013 1444   CALCIUM 9.3 11/15/2012 1215   ALKPHOS 107 01/08/2013 1444   AST 19 01/08/2013 1444   ALT 20 01/08/2013 1444   BILITOT 0.46 01/08/2013 1444       Lab Results  Component Value Date   LABCA2 34 05/24/2012    No components found with this basename: LABCA125    No results found for this basename: INR,  in the last  168 hours  Urinalysis No results found for this basename: colorurine,  appearanceur,  labspec,  phurine,  glucoseu,  hgbur,  bilirubinur,  ketonesur,  proteinur,  urobilinogen,  nitrite,  leukocytesur    STUDIES: No results found.  ASSESSMENT: 47 y.o. BRCA negative [place] woman  (1) status post right breast upper outer quadrant and right axillary lymph node biopsy 04/04/2012, both positive for an invasive ductal carcinoma, grade 3, triple negative, with an MIB-1 of 25%  (2) Treated neoadjuvantly with  (a) fluorouracil, cyclophosphamide, and epirubicin (at 100 mg/M2) x4 completed 06/07/2012  (b) docetaxel (75 mg/M2) for one dose, 06/21/2012, poorly tolerated  (c) carboplatin and gemcitabine given every 21 days for 6 cycles completed 10/11/2012  (3) status post right modified radical mastectomy 11/18/2012 showing a complete pathologic response--all 16 lymph nodes were benign  (4) undergoing radiation therapy, to be completed 03/06/2013  PLAN: We spent the better part of today's 45-minute visit going over her history and making sure the above summary was correct. Ladajah understands that having achieved a complete pathologic response puts her uniquely favorable prognostic group. Her tumor being triple negative, we can't use an antacid as to further reduce the risk of recurrence.  There are only 3 things that have some data that might be helpful from this point. One of them is exercise, the goal being 45 minutes 3 times a week. The second is diet, and in particular avoiding carbohydrates and concentrating on vegetables, with some meat. She understands his diet which is right for her and Reed Pandy may not be the right died for teenagers, or burning upon more calories everyday.  The third possible intervention is bisphosphonates. They have been shown to decrease the risk of recurrence in postmenopausal women. We will obtain a bone density after her next visit here (assuming she still has not  had a  period by then) and if appropriate would consider re\re class.  She will see [place] again in January. She will likely see Dr. Katrinka Blazing 3 months later. She would then see [place] again sometime in the summer. We are not planning to do repeat scans in the absence of symptoms, and we did discuss the small lower right neck lymph nodes noted on her PET scan from November 2013, which were not seen on CT scan March of 2014 and sternum were not palpable today. Note that this area is being irradiated. We will keep an eye on the right neck by physical exam.  She knows to call for any problems that may develop before her next visit here.  Lowella Dell, MD   01/28/2013 3:03 PM

## 2013-01-28 NOTE — Progress Notes (Signed)
Patient ID: Nicole Valentine, female   DOB: 23-Apr-1966, 47 y.o.   MRN: [id number] ID: Nicole Valentine OB: [birth year]  MR#: [id number]  CSN#:[id number]  PCP: [name], MD GYN:   SU:  OTHER MD:   HISTORY OF PRESENT ILLNESS: From doctor Khan's intake nodes 11/10/2012:  "She originally palpated a right breast mass. She had a mammogram performed that showed dense breasts bilaterally. Ultrasound of the right breast showed 2.3 x 1.9 cm area of abnormality with multiple abnormal lymph nodes. MRI of the bilateral breasts showed asymmetrical enhancement throughout the right breast consistent with multicentric disease. An ultrasound-guided biopsy performed. The known area of disease measured 1.9 x 1.9 x 2.3 cm. Multiple abnormal positive right axillary lymph nodes were noted. 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 Biopsy of the right axillary lymph node was positive for malignancy with extracapsular extension. Patient was originally seen by Dr. Jamey Ripa Dr. Donnie Coffin and Dr. Michell Heinrich. She has elected to have a right mastectomy eventually and declined biopsies of any other areas within the breast."  Her subsequent history is as detailed below.  INTERVAL HISTORY: [name] returns today for followup of her breast cancer accompanied by her husband Reed Pandy. Since her last visit here she had her definitive surgery and has started her radiation treatments.  REVIEW OF SYSTEMS: She is tolerating radiation remarkably well. She is mildly fatigued and currently takes a little lab in the middle of the afternoon, perhaps 15 minutes. The only other thing he has noticed is sometimes at night depending on her position she gets tingling and pain in the right arm. She "shakes it off" and it goes back to normal. She has not noted any lymphedema. She has an elliptical at home, but has not gone back to it since early this year, when she was undergoing  chemotherapy. A detailed review of systems was otherwise noncontributory  PAST MEDICAL HISTORY: Past Medical History  Diagnosis Date  . Contact lens/glasses fitting     wears contacts or glasses  . Complication of anesthesia     her father and uncle both had very difficult time waking up-was  something they told them may be hereditory. she will find out.  . Breast cancer dx'd 04-10-12-rt  . Thyroid disease   . Hypothyroidism   . Allergy     almond =itchy lips    PAST SURGICAL HISTORY: Past Surgical History  Procedure Laterality Date  . Wisdom tooth extraction    . Portacath placement  04/22/2012    Procedure: INSERTION PORT-A-CATH;  Surgeon: Currie Paris, MD;  Location: [facility];  Service: General;  Laterality: Left;  [name] Modified mastectomy Right 11/18/2012    Procedure:  RIGHT MODIFIED MASTECTOMY;  Surgeon: Currie Paris, MD;  Location: [facility];  Service: General;  Laterality: Right;  . Jp drain      right breast    FAMILY HISTORY Family History  Problem Relation Age of Onset  . Lung cancer Maternal Grandfather 4    smoker  . Prostate cancer Maternal Grandfather 90  . Throat cancer Other     Great Aunt x 2  . Liver cancer Other     Maternal Great Grandmother  . Melanoma Maternal Uncle 81  . Brain cancer Cousin 11    non-malignant  the patient's parents are living, in their early 43s. The patient has 2 brothers, no sisters. There is no history of breast or  ovarian cancer in the family.   GYNECOLOGIC HISTORY:  Menarche age 40, first live birth age 60. She is GX P4. Let [place] Mr. period was at the time of chemotherapy, December 2013. 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 Reed Pandy is a Psychologist, occupational. They attend the [employer]     ADVANCED DIRECTIVES:    HEALTH MAINTENANCE: History  Substance Use Topics  . Smoking status: Never  Smoker   . Smokeless tobacco: Never Used  . Alcohol Use: No     Colonoscopy:  PAP:  Bone density:  Lipid panel:  Allergies  Allergen Reactions  . Almond Meal     *Pt states when she eats almonds, her lips "get itchy."    Current Outpatient Prescriptions  Medication Sig Dispense Refill  . calcium carbonate (OS-CAL - DOSED IN MG OF ELEMENTAL CALCIUM) 1250 MG tablet Take 1 tablet by mouth daily.      . Cholecalciferol (VITAMIN D-3 PO) Take 2,000 Int'l Units by mouth.      . Cyanocobalamin (VITAMIN B-12 PO) Take 5,000 mg by mouth daily.      [name] doxycycline (VIBRA-TABS) 100 MG tablet Take 1 tablet (100 mg total) by mouth 2 (two) times daily.  14 tablet  2  . Ferrous Sulfate Dried (SLOW RELEASE IRON) 45 MG TBCR Take 65 mg by mouth daily.       . hyaluronate sodium (RADIAPLEXRX) GEL Apply 1 application topically 2 (two) times daily. Apply to affected area,, after rad txs bid      . levothyroxine (SYNTHROID, LEVOTHROID) 200 MCG tablet 150 mcg.       . naproxen (NAPROSYN) 500 MG tablet Take 1 tablet (500 mg total) by mouth 2 (two) times daily with a meal.  40 tablet  1  . non-metallic deodorant (ALRA) MISC Apply 1 application topically daily. Apply after rad txs daily       No current facility-administered medications for this visit.    OBJECTIVE: Filed Vitals:   01/28/13 1504  BP: 123/82  Pulse: 62  Temp: 97.6 F (36.4 C)  Resp: 20     Body mass index is 27.96 kg/(m^2).    ECOG FS:1 - Symptomatic but completely ambulatory  Sclerae unicteric  Oropharynx clear  No cervical or and specifically no right supraclavicular adenopathy  Lungs no rales or rhonchi  Heart regular rate and rhythm  Abd benign  MSK no focal spinal tenderness, no peripheral edema  Neuro: non-focal, well-oriented, appropriate affect  Breasts: The right breast is status post mastectomy; the incision has healed very nicely. There is no evidence of local recurrence. The skin is minimally erythematous, with no  desquamation. The right axillae is benign. The left breast is unremarkable.    LAB RESULTS:  CMP     Component Value Date/Time   NA 145 01/08/2013 1444   NA 142 11/15/2012 1215   K 4.1 01/08/2013 1444   K 3.7 11/15/2012 1215   CL 104 11/15/2012 1215   CL 101 10/18/2012 0859   CO2 30* 01/08/2013 1444   CO2 30 11/15/2012 1215   GLUCOSE 102 01/08/2013 1444   GLUCOSE 104* 11/15/2012 1215   GLUCOSE 83 10/18/2012 0859   BUN 15.1 01/08/2013 1444   BUN 8 11/15/2012 1215   CREATININE 0.8 01/08/2013 1444   CREATININE 0.67 11/15/2012 1215   CALCIUM 9.3 01/08/2013 1444   CALCIUM 9.3 11/15/2012 1215  PROT 7.0 01/08/2013 1444   ALBUMIN 3.6 01/08/2013 1444   AST 19 01/08/2013 1444   ALT 20 01/08/2013 1444   ALKPHOS 107 01/08/2013 1444   BILITOT 0.46 01/08/2013 1444   GFRNONAA >90 11/15/2012 1215   GFRAA >90 11/15/2012 1215    I No results found for this basename: SPEP,  UPEP,   kappa and lambda light chains    Lab Results  Component Value Date   WBC 4.9 01/08/2013   NEUTROABS 2.9 01/08/2013   HGB 14.1 01/08/2013   HCT 40.4 01/08/2013   MCV 103.0* 01/08/2013   PLT 142* 01/08/2013      Chemistry      Component Value Date/Time   NA 145 01/08/2013 1444   NA 142 11/15/2012 1215   K 4.1 01/08/2013 1444   K 3.7 11/15/2012 1215   CL 104 11/15/2012 1215   CL 101 10/18/2012 0859   CO2 30* 01/08/2013 1444   CO2 30 11/15/2012 1215   BUN 15.1 01/08/2013 1444   BUN 8 11/15/2012 1215   CREATININE 0.8 01/08/2013 1444   CREATININE 0.67 11/15/2012 1215      Component Value Date/Time   CALCIUM 9.3 01/08/2013 1444   CALCIUM 9.3 11/15/2012 1215   ALKPHOS 107 01/08/2013 1444   AST 19 01/08/2013 1444   ALT 20 01/08/2013 1444   BILITOT 0.46 01/08/2013 1444       Lab Results  Component Value Date   LABCA2 34 05/24/2012    No components found with this basename: LABCA125    No results found for this basename: INR,  in the last 168 hours  Urinalysis No results found for this basename: colorurine,  appearanceur,   labspec,  phurine,  glucoseu,  hgbur,  bilirubinur,  ketonesur,  proteinur,  urobilinogen,  nitrite,  leukocytesur    STUDIES: No results found.  ASSESSMENT: 47 y.o. BRCA negative [place] woman  (1) an status post right breast upper outer quadrant and right axillary lymph node biopsy 04/04/2012, both positive for an invasive ductal carcinoma, grade 3, triple negative, with an MIB-1 of 25%  (2) Treated neoadjuvantly with  (a) fluorouracil, cyclophosphamide, and epirubicin (at 100 mg/M2) x4 completed 06/07/2012  (b) docetaxel (75 mg/M2) for one dose, 06/21/2012, poorly tolerated  (c) carboplatin and gemcitabine given every 21 days for 6 cycles completed 10/11/2012  (3) status post right modified radical mastectomy 11/18/2012 showing a complete pathologic response--all 16 lymph nodes were benign  (4) undergoing radiation therapy, to be completed 03/06/2013  PLAN: We spent the better part of today's 45-minute visit going over her history and making sure the above summary was correct. Candiace understands that having achieved a complete pathologic response puts her uniquely favorable prognostic group. Her tumor being triple negative, we can't use an antacid as to further reduce the risk of recurrence.  There are only 3 things that have some data that might be helpful from this point. One of them is exercise, the goal being 45 minutes 3 times a week. The second is diet, and in particular avoiding carbohydrates and concentrating on vegetables, with some meat. She understands his diet which is right for her and Reed Pandy may not be the right died for teenagers, or burning upon more calories everyday.  The third possible intervention is bisphosphonates. They have been shown to decrease the risk of recurrence in postmenopausal women. We will obtain a bone density after her next visit here (assuming she still has not had a period by then) and if appropriate  would consider re\re class.  She will see [place]  again in January. She will likely see Dr. Katrinka Blazing 3 months later. She would then see [place] again sometime in the summer. We are not planning to do repeat scans in the absence of symptoms, and we did discuss the small lower right neck lymph nodes noted on her PET scan from November 2013, which were not seen on CT scan March of 2014 and sternum were not palpable today. Note that this area is being irradiated. We will keep an eye on the right neck by physical exam.  She knows to call for any problems that may develop before her next visit here.   Lowella Dell, MD   01/28/2013 3:16 PM

## 2013-01-28 NOTE — Progress Notes (Signed)
Pt denies pain, fatigue, loss of appetite. She is applying Radiaplex to right breast treatment area. VS deferred to expedite pt's appointment w/Dr [name] today.

## 2013-01-29 ENCOUNTER — Ambulatory Visit
Admit: 2013-01-29 | Discharge: 2013-01-29 | Disposition: A | Discharge status: Home / Self Care | Payer: BC Managed Care – PPO | Attending: Radiation Oncology | Admitting: Radiation Oncology

## 2013-01-30 ENCOUNTER — Ambulatory Visit
Admit: 2013-01-30 | Discharge: 2013-01-30 | Disposition: A | Discharge status: Home / Self Care | Payer: BC Managed Care – PPO | Attending: Radiation Oncology | Admitting: Radiation Oncology

## 2013-01-31 ENCOUNTER — Ambulatory Visit
Admit: 2013-01-31 | Discharge: 2013-01-31 | Disposition: A | Discharge status: Home / Self Care | Payer: BC Managed Care – PPO | Attending: Radiation Oncology | Admitting: Radiation Oncology

## 2013-02-03 ENCOUNTER — Ambulatory Visit
Admit: 2013-02-03 | Discharge: 2013-02-03 | Disposition: A | Discharge status: Home / Self Care | Payer: BC Managed Care – PPO | Attending: Radiation Oncology | Admitting: Radiation Oncology

## 2013-02-04 ENCOUNTER — Ambulatory Visit
Admit: 2013-02-04 | Discharge: 2013-02-04 | Disposition: A | Discharge status: Home / Self Care | Payer: BC Managed Care – PPO | Attending: Radiation Oncology | Admitting: Radiation Oncology

## 2013-02-04 VITALS — BP 116/76 | HR 78 | Temp 98.4°F | Wt 182.1 lb

## 2013-02-04 DIAGNOSIS — C50411 Malignant neoplasm of upper-outer quadrant of right female breast: Secondary | ICD-10-CM

## 2013-02-04 NOTE — Progress Notes (Signed)
Patient here for routine weekly assessment of right breast radiation.Completed  11 of 25 treatments.Skin is pink without peeling.Denies pain.No questions/concerns.

## 2013-02-04 NOTE — Progress Notes (Signed)
Weekly Management Note Current Dose:  19.8 Gy  Projected Dose: 60.4 Gy   Narrative:  The patient presents for routine under treatment assessment.  CBCT/MVCT images/Port film x-rays were reviewed.  The chart was checked. Doing well. No complaints. ASked about diet and exercise with weightloss during RT. Will lose weight quickly if she tries.  Physical Findings: Weight: 182 lb 1.6 oz (82.6 kg). Pink right chest wall.   Impression:  The patient is tolerating radiation.  Plan:  Continue treatment as planned. Hold off on significant weight loss as this can interfere with radiation plan.

## 2013-02-05 ENCOUNTER — Ambulatory Visit
Admit: 2013-02-05 | Discharge: 2013-02-05 | Disposition: A | Discharge status: Home / Self Care | Payer: BC Managed Care – PPO | Attending: Radiation Oncology | Admitting: Radiation Oncology

## 2013-02-06 ENCOUNTER — Ambulatory Visit
Admit: 2013-02-06 | Discharge: 2013-02-06 | Disposition: A | Discharge status: Home / Self Care | Payer: BC Managed Care – PPO | Attending: Radiation Oncology | Admitting: Radiation Oncology

## 2013-02-07 ENCOUNTER — Ambulatory Visit
Admit: 2013-02-07 | Discharge: 2013-02-07 | Disposition: A | Discharge status: Home / Self Care | Payer: BC Managed Care – PPO | Attending: Radiation Oncology | Admitting: Radiation Oncology

## 2013-02-10 ENCOUNTER — Ambulatory Visit
Admit: 2013-02-10 | Discharge: 2013-02-10 | Disposition: A | Discharge status: Home / Self Care | Payer: BC Managed Care – PPO | Attending: Radiation Oncology | Admitting: Radiation Oncology

## 2013-02-11 ENCOUNTER — Ambulatory Visit
Admission: RE | Admit: 2013-02-11 | Discharge: 2013-02-11 | Disposition: A | Discharge status: Home / Self Care | Payer: BC Managed Care – PPO | Source: Ambulatory Visit | Attending: Radiation Oncology | Admitting: Radiation Oncology

## 2013-02-11 ENCOUNTER — Encounter: Payer: Self-pay | Admitting: Radiation Oncology

## 2013-02-11 VITALS — BP 126/87 | HR 60 | Temp 98.4°F | Resp 20 | Wt 181.2 lb

## 2013-02-11 DIAGNOSIS — C50411 Malignant neoplasm of upper-outer quadrant of right female breast: Secondary | ICD-10-CM

## 2013-02-11 NOTE — Progress Notes (Signed)
Pt denies pain, fatigue. She is applying Radiaplex to right chest wall/breast treatment area, some pinkness of skin.

## 2013-02-11 NOTE — Progress Notes (Signed)
Weekly Management Note Current Dose: 16  Gy  Projected Dose: 60.4 Gy   Narrative:  The patient presents for routine under treatment assessment.  CBCT/MVCT images/Port film x-rays were reviewed.  The chart was checked. Some skin irritation but feeling well.   Physical Findings: Weight: 181 lb 3.2 oz (82.192 kg). Pink skin.   Impression:  The patient is tolerating radiation.  Plan:  Continue treatment as planned. Continue RT.

## 2013-02-12 ENCOUNTER — Ambulatory Visit
Admit: 2013-02-12 | Discharge: 2013-02-12 | Disposition: A | Discharge status: Home / Self Care | Payer: BC Managed Care – PPO | Attending: Radiation Oncology | Admitting: Radiation Oncology

## 2013-02-13 ENCOUNTER — Ambulatory Visit
Admit: 2013-02-13 | Discharge: 2013-02-13 | Disposition: A | Discharge status: Home / Self Care | Payer: BC Managed Care – PPO | Attending: Radiation Oncology | Admitting: Radiation Oncology

## 2013-02-14 ENCOUNTER — Ambulatory Visit
Admission: RE | Admit: 2013-02-14 | Discharge: 2013-02-14 | Disposition: A | Discharge status: Home / Self Care | Payer: BC Managed Care – PPO | Source: Ambulatory Visit | Attending: Radiation Oncology | Admitting: Radiation Oncology

## 2013-02-17 ENCOUNTER — Ambulatory Visit
Admission: RE | Admit: 2013-02-17 | Discharge: 2013-02-17 | Disposition: A | Discharge status: Home / Self Care | Payer: BC Managed Care – PPO | Source: Ambulatory Visit | Attending: Radiation Oncology | Admitting: Radiation Oncology

## 2013-02-18 ENCOUNTER — Ambulatory Visit
Admission: RE | Admit: 2013-02-18 | Discharge: 2013-02-18 | Disposition: A | Discharge status: Home / Self Care | Payer: BC Managed Care – PPO | Source: Ambulatory Visit | Attending: Radiation Oncology | Admitting: Radiation Oncology

## 2013-02-18 ENCOUNTER — Ambulatory Visit
Admit: 2013-02-18 | Discharge: 2013-02-18 | Disposition: A | Discharge status: Home / Self Care | Payer: BC Managed Care – PPO | Attending: Radiation Oncology | Admitting: Radiation Oncology

## 2013-02-18 ENCOUNTER — Encounter: Payer: Self-pay | Admitting: Radiation Oncology

## 2013-02-18 VITALS — BP 133/78 | HR 72 | Resp 16 | Wt 181.9 lb

## 2013-02-18 DIAGNOSIS — C50411 Malignant neoplasm of upper-outer quadrant of right female breast: Secondary | ICD-10-CM

## 2013-02-18 MED ORDER — RADIAPLEXRX EX GEL
Freq: Once | CUTANEOUS | Status: AC
  Administered 2013-02-18: 15:00:00 via TOPICAL

## 2013-02-18 NOTE — Progress Notes (Signed)
[facility]    Radiation Oncology [address] [place]     Maryln Gottron, M.D. [place], [place] [address]               Billie Lade, M.D., Ph.D. Phone: [phone]      Molli Hazard A. Kathrynn Running, M.D. Fax: [phone]      Radene Gunning, M.D., Ph.D.         Lurline Hare, M.D.         Grayland Jack, M.D Weekly Treatment Management Note  Name: Nicole Valentine     MRN: [id number]        CSN: [id number] Date: 02/18/2013      DOB: 09-Dec-1965  CC: Nicole Found, MD         Katrinka Blazing    Status: Outpatient  Diagnosis: The encounter diagnosis was Breast cancer of upper-outer quadrant of right female breast.  Current Dose: 37.8 Gy   Current Fraction: 21  Planned Dose: 60.4 Gy  Narrative: Nicole Valentine was seen today for weekly treatment management. The chart was checked and port films  were reviewed. She is tolerating the treatments well at this time. She was very active over the weekend however with the  Muscular Dystrophy bike a thon. Patient lifted several coolers. She has noticed swelling in her right arm since the weekend. She denies any chills or fever denies any insect bite to her right arm.  Almond meal Current Outpatient Prescriptions  Medication Sig Dispense Refill  . calcium carbonate (OS-CAL - DOSED IN MG OF ELEMENTAL CALCIUM) 1250 MG tablet Take 1 tablet by mouth daily.      . Cholecalciferol (VITAMIN D-3 PO) Take 2,000 Int'l Units by mouth.      . Cyanocobalamin (VITAMIN B-12 PO) Take 5,000 mg by mouth daily.      . Ferrous Sulfate Dried (SLOW RELEASE IRON) 45 MG TBCR Take 65 mg by mouth daily.       . hyaluronate sodium (RADIAPLEXRX) GEL Apply 1 application topically 2 (two) times daily. Apply to affected area,, after rad txs bid      . levothyroxine (SYNTHROID, LEVOTHROID) 200 MCG tablet 150 mcg.       . naproxen (NAPROSYN) 500 MG tablet Take 1 tablet (500 mg total) by mouth 2 (two) times daily with a meal.  40 tablet  1  . non-metallic deodorant  (ALRA) MISC Apply 1 application topically daily. Apply after rad txs daily       No current facility-administered medications for this encounter.   Labs:  Lab Results  Component Value Date   WBC 4.9 01/08/2013   HGB 14.1 01/08/2013   HCT 40.4 01/08/2013   MCV 103.0* 01/08/2013   PLT 142* 01/08/2013   Lab Results  Component Value Date   CREATININE 0.8 01/08/2013   BUN 15.1 01/08/2013   NA 145 01/08/2013   K 4.1 01/08/2013   CL 104 11/15/2012   CO2 30* 01/08/2013   Lab Results  Component Value Date   ALT 20 01/08/2013   AST 19 01/08/2013   BILITOT 0.46 01/08/2013    Physical Examination:  weight is 181 lb 14.4 oz (82.509 kg). Her blood pressure is 133/78 and her pulse is 72. Her respiration is 16.    Wt Readings from Last 3 Encounters:  02/18/13 181 lb 14.4 oz (82.509 kg)  02/11/13 181 lb 3.2 oz (82.192 kg)  02/04/13 182 lb 1.6 oz (82.6 kg)    The right arm is visibly swollen compared  to the left. There are no palpable cords appreciated. No obvious cellulitis. The right chest area shows some erythema and hyperpigmentation changes without any skin breakdown. Lungs - Normal respiratory effort, chest expands symmetrically. Lungs are clear to auscultation, no crackles or wheezes.  Heart has regular rhythm and rate  Abdomen is soft and non tender with normal bowel sounds  Assessment:  Patient tolerating treatments well  Plan: Continue treatment per original radiation prescription.  She will be referred to the lymphedema clinic.

## 2013-02-18 NOTE — Progress Notes (Signed)
Using radiaplex bid as directed. Hyperpigmentation of treatment area noted without desquamation. Reports mild fatigue. Denies pain at this time. New onset lymphedema of right arm noted. Reports soreness of right axilla.

## 2013-02-19 ENCOUNTER — Ambulatory Visit
Admission: RE | Admit: 2013-02-19 | Discharge: 2013-02-19 | Disposition: A | Discharge status: Home / Self Care | Payer: BC Managed Care – PPO | Source: Ambulatory Visit | Attending: Radiation Oncology | Admitting: Radiation Oncology

## 2013-02-20 ENCOUNTER — Ambulatory Visit
Admission: RE | Admit: 2013-02-20 | Discharge: 2013-02-20 | Disposition: A | Discharge status: Home / Self Care | Payer: BC Managed Care – PPO | Source: Ambulatory Visit | Attending: Radiation Oncology | Admitting: Radiation Oncology

## 2013-02-21 ENCOUNTER — Ambulatory Visit
Admission: RE | Admit: 2013-02-21 | Discharge: 2013-02-21 | Disposition: A | Discharge status: Home / Self Care | Payer: BC Managed Care – PPO | Source: Ambulatory Visit | Attending: Radiation Oncology | Admitting: Radiation Oncology

## 2013-02-24 ENCOUNTER — Ambulatory Visit
Admission: RE | Admit: 2013-02-24 | Discharge: 2013-02-24 | Disposition: A | Discharge status: Home / Self Care | Payer: BC Managed Care – PPO | Source: Ambulatory Visit | Attending: Radiation Oncology | Admitting: Radiation Oncology

## 2013-02-24 ENCOUNTER — Ambulatory Visit: Payer: BC Managed Care – PPO | Attending: Surgery | Admitting: Physical Therapy

## 2013-02-24 DIAGNOSIS — IMO0001 Reserved for inherently not codable concepts without codable children: Secondary | ICD-10-CM | POA: Insufficient documentation

## 2013-02-24 DIAGNOSIS — I890 Lymphedema, not elsewhere classified: Secondary | ICD-10-CM | POA: Insufficient documentation

## 2013-02-24 DIAGNOSIS — M25519 Pain in unspecified shoulder: Secondary | ICD-10-CM | POA: Insufficient documentation

## 2013-02-25 ENCOUNTER — Ambulatory Visit
Admission: RE | Admit: 2013-02-25 | Discharge: 2013-02-25 | Disposition: A | Discharge status: Home / Self Care | Payer: BC Managed Care – PPO | Source: Ambulatory Visit | Attending: Radiation Oncology | Admitting: Radiation Oncology

## 2013-02-25 DIAGNOSIS — C50411 Malignant neoplasm of upper-outer quadrant of right female breast: Secondary | ICD-10-CM

## 2013-02-25 NOTE — Progress Notes (Signed)
Weekly Management Note Current Dose: 46.8  Gy  Projected Dose: 60.4 Gy   Narrative:  The patient presents for routine under treatment assessment.  CBCT/MVCT images/Port film x-rays were reviewed.  The chart was checked. Doing well. Some minimal irritation. Marked out electron boost on the machine. Asked me to sign for a compression sleeve.  Physical Findings: Dermatitis over right chest wall. Dry desquamation in axilla  Impression:  The patient is tolerating radiation.  Plan:  Continue treatment as planned. Continue radiaplex.

## 2013-02-26 ENCOUNTER — Ambulatory Visit
Admission: RE | Admit: 2013-02-26 | Discharge: 2013-02-26 | Disposition: A | Discharge status: Home / Self Care | Payer: BC Managed Care – PPO | Source: Ambulatory Visit | Attending: Radiation Oncology | Admitting: Radiation Oncology

## 2013-02-27 ENCOUNTER — Ambulatory Visit
Admission: RE | Admit: 2013-02-27 | Discharge: 2013-02-27 | Disposition: A | Discharge status: Home / Self Care | Payer: BC Managed Care – PPO | Source: Ambulatory Visit | Attending: Radiation Oncology | Admitting: Radiation Oncology

## 2013-02-28 ENCOUNTER — Encounter: Payer: Self-pay | Admitting: Radiation Oncology

## 2013-02-28 ENCOUNTER — Ambulatory Visit
Admission: RE | Admit: 2013-02-28 | Discharge: 2013-02-28 | Disposition: A | Discharge status: Home / Self Care | Payer: BC Managed Care – PPO | Source: Ambulatory Visit | Attending: Radiation Oncology | Admitting: Radiation Oncology

## 2013-03-03 ENCOUNTER — Ambulatory Visit
Admission: RE | Admit: 2013-03-03 | Discharge: 2013-03-03 | Disposition: A | Discharge status: Home / Self Care | Payer: BC Managed Care – PPO | Source: Ambulatory Visit | Attending: Radiation Oncology | Admitting: Radiation Oncology

## 2013-03-04 ENCOUNTER — Ambulatory Visit
Admission: RE | Admit: 2013-03-04 | Discharge: 2013-03-04 | Disposition: A | Discharge status: Home / Self Care | Payer: BC Managed Care – PPO | Source: Ambulatory Visit | Attending: Radiation Oncology | Admitting: Radiation Oncology

## 2013-03-04 VITALS — BP 130/87 | HR 79 | Temp 98.2°F | Wt 181.8 lb

## 2013-03-04 DIAGNOSIS — C50411 Malignant neoplasm of upper-outer quadrant of right female breast: Secondary | ICD-10-CM

## 2013-03-04 NOTE — Progress Notes (Signed)
Weekly Management Note Current Dose: 56.4  Gy  Projected Dose: 60.4 Gy   Narrative:  The patient presents for routine under treatment assessment.  CBCT/MVCT images/Port film x-rays were reviewed.  The chart was checked. Left axilla is breaking down. Arm is tight. Needs to call PT. Due for left mammogram.  Physical Findings: Weight: 181 lb 12.8 oz (82.464 kg). Moist desquamation in left axilla. REst of chest wall is pink.   Impression:  The patient is tolerating radiation.  Plan:  Continue treatment as planned. Neosporin to axilla. Radiaplex x 2 weeks. Discussed skin care. Pt will start exercises shown by PT at home. Call with questions. F/U with med onc in January.

## 2013-03-04 NOTE — Progress Notes (Signed)
Patient for routine weekly assessment of radiation to right chestwall.Has wet desquamated area of right axilla.Skin with red hyperpigmentation.Has discomfort.a little more fatigued but able to perform daily activitiies.Continue with application neosporin of axilla and radiaplex.will start lotion with vitamin E when radiaplex runs out.

## 2013-03-05 ENCOUNTER — Ambulatory Visit
Admission: RE | Admit: 2013-03-05 | Discharge: 2013-03-05 | Disposition: A | Discharge status: Home / Self Care | Payer: BC Managed Care – PPO | Source: Ambulatory Visit | Attending: Radiation Oncology | Admitting: Radiation Oncology

## 2013-03-05 ENCOUNTER — Ambulatory Visit (HOSPITAL_BASED_OUTPATIENT_CLINIC_OR_DEPARTMENT_OTHER): Payer: BC Managed Care – PPO

## 2013-03-05 VITALS — BP 137/83 | HR 72 | Temp 97.9°F | Resp 16

## 2013-03-05 DIAGNOSIS — C773 Secondary and unspecified malignant neoplasm of axilla and upper limb lymph nodes: Secondary | ICD-10-CM

## 2013-03-05 DIAGNOSIS — C50919 Malignant neoplasm of unspecified site of unspecified female breast: Secondary | ICD-10-CM

## 2013-03-05 DIAGNOSIS — Z452 Encounter for adjustment and management of vascular access device: Secondary | ICD-10-CM

## 2013-03-05 DIAGNOSIS — C50419 Malignant neoplasm of upper-outer quadrant of unspecified female breast: Secondary | ICD-10-CM

## 2013-03-05 MED ORDER — SODIUM CHLORIDE 0.9 % IJ SOLN
10.0000 mL | INTRAMUSCULAR | Status: DC | PRN
  Administered 2013-03-05: 10 mL via INTRAVENOUS
  Filled 2013-03-05: qty 10

## 2013-03-05 MED ORDER — HEPARIN SOD (PORK) LOCK FLUSH 100 UNIT/ML IV SOLN
500.0000 [IU] | Freq: Once | INTRAVENOUS | Status: AC
  Administered 2013-03-05: 500 [IU] via INTRAVENOUS
  Filled 2013-03-05: qty 5

## 2013-03-06 ENCOUNTER — Encounter: Payer: Self-pay | Admitting: Radiation Oncology

## 2013-03-06 ENCOUNTER — Ambulatory Visit
Admission: RE | Admit: 2013-03-06 | Discharge: 2013-03-06 | Disposition: A | Discharge status: Home / Self Care | Payer: BC Managed Care – PPO | Source: Ambulatory Visit | Attending: Radiation Oncology | Admitting: Radiation Oncology

## 2013-03-07 ENCOUNTER — Encounter (INDEPENDENT_AMBULATORY_CARE_PROVIDER_SITE_OTHER): Payer: Self-pay | Admitting: Surgery

## 2013-03-11 ENCOUNTER — Encounter: Payer: BC Managed Care – PPO | Admitting: Physical Therapy

## 2013-03-13 ENCOUNTER — Ambulatory Visit: Payer: BC Managed Care – PPO

## 2013-03-16 NOTE — Progress Notes (Addendum)
°  Radiation Oncology         [phone] ________________________________  Name: Nicole Valentine MRN: [id number]  Date: 03/06/2013  DOB: 1965/12/09  End of Treatment Note  Diagnosis:   Stage III triple negative right breast cancer    Indication for treatment:  curative       Radiation treatment dates:   01/21/2013-03/06/2013  Site/dose:   Site/dose:    Right chest wall / 50.4 Gray @ 1.8 Wallace Cullens per fraction x 28 fractions Right supraclavicular fossa / 45 Gy @1 .8 Gy per fraction x 25 fractions Right scar boost / 10 Gray at [employer] per fraction x 5 fractions  Beams/energy:  Opposed Tangents / 6 MV photons LAO / 6 and 10 MV photons Enface electrons with energy  Narrative: The patient tolerated radiation treatment relatively well.   She had minimal moist desquamation in her axilla. She had the expected fatigue.  Plan: The patient has completed radiation treatment. The patient will return to radiation oncology clinic for routine followup in one month. I advised her to call or return sooner if they have any questions or concerns related to their recovery or treatment.  ------------------------------------------------  Lurline Hare, MD

## 2013-03-16 NOTE — Progress Notes (Signed)
Name: Nicole Valentine   MRN: [id number]  Date:  02/28/2013    DOB: 1965/07/15  Status:outpatient    DIAGNOSIS: Breast cancer.  CONSENT VERIFIED: yes   SET UP: Patient is setup supine   IMMOBILIZATION:  The following immobilization was used:Custom Moldable Pillow, breast board.   NARRATIVE: Nicole Valentine underwent complex simulation and treatment planning for her boost treatment today.  Her tumor volume was outlined on the planning CT scan. The width of the chest wall will be treated along the scar with margin.    6  MeV electrons will be prescribed to the 90% isodose line.   A block will be used for beam modification purposes.  A special port plan is requested.

## 2013-03-17 ENCOUNTER — Ambulatory Visit: Payer: BC Managed Care – PPO | Admitting: Physical Therapy

## 2013-03-20 ENCOUNTER — Ambulatory Visit: Payer: BC Managed Care – PPO

## 2013-03-24 ENCOUNTER — Ambulatory Visit: Payer: BC Managed Care – PPO | Attending: Surgery | Admitting: Physical Therapy

## 2013-03-24 DIAGNOSIS — M25519 Pain in unspecified shoulder: Secondary | ICD-10-CM | POA: Insufficient documentation

## 2013-03-24 DIAGNOSIS — I890 Lymphedema, not elsewhere classified: Secondary | ICD-10-CM | POA: Insufficient documentation

## 2013-03-24 DIAGNOSIS — IMO0001 Reserved for inherently not codable concepts without codable children: Secondary | ICD-10-CM | POA: Insufficient documentation

## 2013-03-25 ENCOUNTER — Ambulatory Visit (INDEPENDENT_AMBULATORY_CARE_PROVIDER_SITE_OTHER): Payer: BC Managed Care – PPO | Admitting: Surgery

## 2013-03-27 ENCOUNTER — Other Ambulatory Visit: Payer: Self-pay

## 2013-03-27 ENCOUNTER — Ambulatory Visit: Payer: BC Managed Care – PPO

## 2013-03-31 ENCOUNTER — Ambulatory Visit: Payer: BC Managed Care – PPO | Admitting: Physical Therapy

## 2013-04-03 ENCOUNTER — Ambulatory Visit: Payer: BC Managed Care – PPO

## 2013-04-08 ENCOUNTER — Encounter (INDEPENDENT_AMBULATORY_CARE_PROVIDER_SITE_OTHER): Payer: Self-pay | Admitting: Surgery

## 2013-04-08 ENCOUNTER — Ambulatory Visit (INDEPENDENT_AMBULATORY_CARE_PROVIDER_SITE_OTHER): Payer: BC Managed Care – PPO | Admitting: Surgery

## 2013-04-08 VITALS — BP 122/82 | HR 80 | Temp 97.0°F | Resp 14 | Ht 67.5 in | Wt 183.0 lb

## 2013-04-08 DIAGNOSIS — Z853 Personal history of malignant neoplasm of breast: Secondary | ICD-10-CM

## 2013-04-08 NOTE — Progress Notes (Signed)
NAME: Nicole Valentine       DOB: [birth year]           DATE: 04/08/2013        MRN: [id number]  CC:   Chief Complaint  Patient presents with  . Breast Cancer Long Term Follow Up    Nicole Valentine is a 47 y.o.Marland Kitchenfemale who presents for routine followup of her Right breast cancer, IDC, Stage II at Dx diagnosed in Nov, 2013 and treated with Neoadjuvant chemo, MRM, radiation. She has no problems or concerns on either side.She recently finished radiation and thinks she tolerated it well.  PFSH: She has had no significant changes since the last visit here.  ROS: There have been no significant changes since the last visit here  EXAM:  VS: BP 122/82  Pulse 80  Temp(Src) 97 F (36.1 C) (Temporal)  Resp 14  Ht 5' 7.5" (1.715 m)  Wt 183 lb (83.008 kg)  BMI 28.22 kg/m2  General: The patient is alert, oriented, generally healthy appearing, NAD. Mood and affect are normal.  Breasts:  Right is post mastectomy, smot, no lesions noted, minimal radiation changes. Left nortmal. Port in situ  Lymphatics: She has no axillary or supraclavicular adenopathy on either side.  Extremities: Full ROM of the surgical side with no lymphedema noted.  Data Reviewed: No new  Impression: Doing well, with no evidence of recurrent cancer or new cancer  Plan: Will continue to follow up on an annual basis here.Discussed port removal with her if she wishe and oncologist gives the oK

## 2013-04-08 NOTE — Patient Instructions (Signed)
Continue annual mammograms and followups here 

## 2013-04-10 ENCOUNTER — Ambulatory Visit: Payer: Self-pay | Admitting: Radiation Oncology

## 2013-04-11 ENCOUNTER — Ambulatory Visit
Admission: RE | Admit: 2013-04-11 | Discharge: 2013-04-11 | Disposition: A | Discharge status: Home / Self Care | Payer: BC Managed Care – PPO | Source: Ambulatory Visit | Attending: Radiation Oncology | Admitting: Radiation Oncology

## 2013-04-11 VITALS — BP 128/73 | HR 72 | Temp 97.7°F | Ht 67.5 in | Wt 185.9 lb

## 2013-04-11 DIAGNOSIS — C50411 Malignant neoplasm of upper-outer quadrant of right female breast: Secondary | ICD-10-CM

## 2013-04-11 NOTE — Progress Notes (Signed)
   Department of Radiation Oncology  Phone:  [phone] Fax:        [phone]   Name: Alyah Boehning MRN: [id number]  DOB: 1965/06/03  Date: 04/11/2013  Follow Up Visit Note  Diagnosis: Stage III triple negative right breast cancer  Summary and Interval since last radiation: One month from the 61 gray to the right chest wall and supraclavicular fossa completed 03/06/2013  Interval History: Lesa presents today for routine followup.  She is feeling well and healed up nicely. She is concerned about her mother-in-law who was recently hospitalized for shingles. She saw Dr. Jamey Ripa in November and he is encouraged her to take at her Port-A-Cath. She is still continuing to see Dr. Daneil Dolin not and has an appointment with him in January. She was triple negative so she has not been on antiestrogen therapy.   Allergies:  Allergies  Allergen Reactions  . Almond Meal     *Pt states when she eats almonds, her lips "get itchy."    Medications:  Current Outpatient Prescriptions  Medication Sig Dispense Refill  . levothyroxine (SYNTHROID, LEVOTHROID) 200 MCG tablet 150 mcg.       . calcium carbonate (OS-CAL - DOSED IN MG OF ELEMENTAL CALCIUM) 1250 MG tablet Take 1 tablet by mouth daily.      . Cholecalciferol (VITAMIN D-3 PO) Take 2,000 Int'l Units by mouth.      . Cyanocobalamin (VITAMIN B-12 PO) Take 5,000 mg by mouth daily.      . Ferrous Sulfate Dried (SLOW RELEASE IRON) 45 MG TBCR Take 65 mg by mouth daily.       . naproxen (NAPROSYN) 500 MG tablet Take 1 tablet (500 mg total) by mouth 2 (two) times daily with a meal.  40 tablet  1  . non-metallic deodorant (ALRA) MISC Apply 1 application topically daily. Apply after rad txs daily       No current facility-administered medications for this encounter.    Physical Exam:  Filed Vitals:   04/11/13 1522  BP: 128/73  Pulse: 72  Temp: 97.7 F (36.5 C)   her right chest wall is healed well. Her skin is dry. There is very minimal  hyperpigmentation.  IMPRESSION: Justyna is a 47 y.o. female status post neoadjuvant chemotherapy, mastectomy and radiation with resolving acute effects of treatment  PLAN:  Sibbie looks great. We discussed her mother-in-law in great detail. We discussed sun protection in the treated area. We discussed her followup with Dr. Daneil Dolin not. I released her from followup with me but of course be happy to see her back on an as-needed basis. At her request I will forward her mother sister summary to her neurologist at [facility].    Lurline Hare, MD

## 2013-04-11 NOTE — Progress Notes (Addendum)
Nicole Valentine here for follow up after treatment for right breast cancer.  She denies pain and fatigue.  The skin on her right chest and underarm is intact.  She has one small bump/dry spot in her right underarm that she says has been there for a few weeks.  She is using aveno with vitamin e on her right chest.

## 2013-04-14 ENCOUNTER — Ambulatory Visit: Payer: BC Managed Care – PPO | Admitting: Physical Therapy

## 2013-04-18 ENCOUNTER — Encounter: Payer: Self-pay | Admitting: Oncology

## 2013-04-21 ENCOUNTER — Ambulatory Visit: Payer: BC Managed Care – PPO | Attending: Surgery | Admitting: Physical Therapy

## 2013-04-21 ENCOUNTER — Other Ambulatory Visit: Payer: Self-pay | Admitting: Oncology

## 2013-04-21 DIAGNOSIS — I890 Lymphedema, not elsewhere classified: Secondary | ICD-10-CM | POA: Insufficient documentation

## 2013-04-21 DIAGNOSIS — IMO0001 Reserved for inherently not codable concepts without codable children: Secondary | ICD-10-CM | POA: Insufficient documentation

## 2013-04-21 DIAGNOSIS — M25519 Pain in unspecified shoulder: Secondary | ICD-10-CM | POA: Insufficient documentation

## 2013-04-22 ENCOUNTER — Ambulatory Visit (HOSPITAL_BASED_OUTPATIENT_CLINIC_OR_DEPARTMENT_OTHER): Payer: BC Managed Care – PPO

## 2013-04-22 VITALS — BP 143/73 | HR 72 | Temp 97.0°F

## 2013-04-22 DIAGNOSIS — C50419 Malignant neoplasm of upper-outer quadrant of unspecified female breast: Secondary | ICD-10-CM

## 2013-04-22 DIAGNOSIS — C773 Secondary and unspecified malignant neoplasm of axilla and upper limb lymph nodes: Secondary | ICD-10-CM

## 2013-04-22 DIAGNOSIS — Z452 Encounter for adjustment and management of vascular access device: Secondary | ICD-10-CM

## 2013-04-22 DIAGNOSIS — Z95828 Presence of other vascular implants and grafts: Secondary | ICD-10-CM

## 2013-04-22 MED ORDER — HEPARIN SOD (PORK) LOCK FLUSH 100 UNIT/ML IV SOLN
500.0000 [IU] | Freq: Once | INTRAVENOUS | Status: AC
  Administered 2013-04-22: 500 [IU] via INTRAVENOUS
  Filled 2013-04-22: qty 5

## 2013-04-22 MED ORDER — SODIUM CHLORIDE 0.9 % IJ SOLN
10.0000 mL | INTRAMUSCULAR | Status: DC | PRN
  Administered 2013-04-22: 10 mL via INTRAVENOUS
  Filled 2013-04-22: qty 10

## 2013-04-22 NOTE — Patient Instructions (Signed)
Implanted Port Instructions  An implanted port is a central line that has a round shape and is placed under the skin. It is used for long-term IV (intravenous) access for:  · Medicine.  · Fluids.  · Liquid nutrition, such as TPN (total parenteral nutrition).  · Blood samples.  Ports can be placed:  · In the chest area just below the collarbone (this is the most common place.)  · In the arms.  · In the belly (abdomen) area.  · In the legs.  PARTS OF THE PORT  A port has 2 main parts:  · The reservoir. The reservoir is round, disc-shaped, and will be a small, raised area under your skin.  · The reservoir is the part where a needle is inserted (accessed) to either give medicines or to draw blood.  · The catheter. The catheter is a long, slender tube that extends from the reservoir. The catheter is placed into a large vein.  · Medicine that is inserted into the reservoir goes into the catheter and then into the vein.  INSERTION OF THE PORT  · The port is surgically placed in either an operating room or in a procedural area (interventional radiology).  · Medicine may be given to help you relax during the procedure.  · The skin where the port will be inserted is numbed (local anesthetic).  · 1 or 2 small cuts (incisions) will be made in the skin to insert the port.  · The port can be used after it has been inserted.  INCISION SITE CARE  · The incision site may have small adhesive strips on it. This helps keep the incision site closed. Sometimes, no adhesive strips are placed. Instead of adhesive strips, a special kind of surgical glue is used to keep the incision closed.  · If adhesive strips were placed on the incision sites, do not take them off. They will fall off on their own.  · The incision site may be sore for 1 to 2 days. Pain medicine can help.  · Do not get the incision site wet. Bathe or shower as directed by your caregiver.  · The incision site should heal in 5 to 7 days. A small scar may form after the  incision has healed.  ACCESSING THE PORT  Special steps must be taken to access the port:  · Before the port is accessed, a numbing cream can be placed on the skin. This helps numb the skin over the port site.  · A sterile technique is used to access the port.  · The port is accessed with a needle. Only "non-coring" port needles should be used to access the port. Once the port is accessed, a blood return should be checked. This helps ensure the port is in the vein and is not clogged (clotted).  · If your caregiver believes your port should remain accessed, a clear (transparent) bandage will be placed over the needle site. The bandage and needle will need to be changed every week or as directed by your caregiver.  · Keep the bandage covering the needle clean and dry. Do not get it wet. Follow your caregiver's instructions on how to take a shower or bath when the port is accessed.  · If your port does not need to stay accessed, no bandage is needed over the port.  FLUSHING THE PORT  Flushing the port keeps it from getting clogged. How often the port is flushed depends on:  · If a   constant infusion is running. If a constant infusion is running, the port may not need to be flushed.  · If intermittent medicines are given.  · If the port is not being used.  For intermittent medicines:  · The port will need to be flushed:  · After medicines have been given.  · After blood has been drawn.  · As part of routine maintenance.  · A port is normally flushed with:  · Normal saline.  · Heparin.  · Follow your caregiver's advice on how often, how much, and the type of flush to use on your port.  IMPORTANT PORT INFORMATION  · Tell your caregiver if you are allergic to heparin.  · After your port is placed, you will get a manufacturer's information card. The card has information about your port. Keep this card with you at all times.  · There are many types of ports available. Know what kind of port you have.  · In case of an  emergency, it may be helpful to wear a medical alert bracelet. This can help alert health care workers that you have a port.  · The port can stay in for as long as your caregiver believes it is necessary.  · When it is time for the port to come out, surgery will be done to remove it. The surgery will be similar to how the port was put in.  · If you are in the hospital or clinic:  · Your port will be taken care of and flushed by a nurse.  · If you are at home:  · A home health care nurse may give medicines and take care of the port.  · You or a family member can get special training and directions for giving medicine and taking care of the port at home.  SEEK IMMEDIATE MEDICAL CARE IF:   · Your port does not flush or you are unable to get a blood return.  · New drainage or pus is coming from the incision.  · A bad smell is coming from the incision site.  · You develop swelling or increased redness at the incision site.  · You develop increased swelling or pain at the port site.  · You develop swelling or pain in the surrounding skin near the port.  · You have an oral temperature above 102° F (38.9° C), not controlled by medicine.  MAKE SURE YOU:   · Understand these instructions.  · Will watch your condition.  · Will get help right away if you are not doing well or get worse.  Document Released: 05/08/2005 Document Revised: 07/31/2011 Document Reviewed: 07/30/2008  Exit[employer]® Patient Information ©2014 [employer].

## 2013-04-24 ENCOUNTER — Ambulatory Visit: Payer: BC Managed Care – PPO | Admitting: Physical Therapy

## 2013-04-28 ENCOUNTER — Ambulatory Visit: Payer: BC Managed Care – PPO | Admitting: Physical Therapy

## 2013-05-01 ENCOUNTER — Ambulatory Visit: Payer: BC Managed Care – PPO | Admitting: Physical Therapy

## 2013-05-05 ENCOUNTER — Ambulatory Visit: Payer: BC Managed Care – PPO | Admitting: Physical Therapy

## 2013-05-07 ENCOUNTER — Ambulatory Visit: Payer: BC Managed Care – PPO | Admitting: Physical Therapy

## 2013-05-12 ENCOUNTER — Encounter: Payer: BC Managed Care – PPO | Admitting: Physical Therapy

## 2013-05-29 ENCOUNTER — Ambulatory Visit (HOSPITAL_BASED_OUTPATIENT_CLINIC_OR_DEPARTMENT_OTHER): Payer: BC Managed Care – PPO | Admitting: Oncology

## 2013-05-29 VITALS — BP 116/81 | HR 71 | Temp 98.1°F | Resp 18 | Ht 67.5 in | Wt 187.5 lb

## 2013-05-29 DIAGNOSIS — N951 Menopausal and female climacteric states: Secondary | ICD-10-CM

## 2013-05-29 DIAGNOSIS — Z171 Estrogen receptor negative status [ER-]: Secondary | ICD-10-CM

## 2013-05-29 DIAGNOSIS — C773 Secondary and unspecified malignant neoplasm of axilla and upper limb lymph nodes: Secondary | ICD-10-CM

## 2013-05-29 DIAGNOSIS — C50419 Malignant neoplasm of upper-outer quadrant of unspecified female breast: Secondary | ICD-10-CM

## 2013-05-29 DIAGNOSIS — C50411 Malignant neoplasm of upper-outer quadrant of right female breast: Secondary | ICD-10-CM

## 2013-05-29 NOTE — Progress Notes (Signed)
And a Patient ID: Nicole Valentine, female   DOB: 09/26/1965, 48 y.o.   MRN: [id number] ID: Nicole Valentine OB: August 12, 1965  MR#: [id number]  XLK#:[id number]  PCP: [name], MD GYN:   SU: [name]); [name] OTHER MD: [name]   HISTORY OF PRESENT ILLNESS: From doctor Khan's intake nodes 11/10/2012:  "She originally palpated a right breast mass. She had a mammogram performed that showed dense breasts bilaterally. Ultrasound of the right breast showed 2.3 x 1.9 cm area of abnormality with multiple abnormal lymph nodes. MRI of the bilateral breasts showed asymmetrical enhancement throughout the right breast consistent with multicentric disease. An ultrasound-guided biopsy performed. The known area of disease measured 1.9 x 1.9 x 2.3 cm. Multiple abnormal positive right axillary lymph nodes were noted. 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 Biopsy of the right axillary lymph node was positive for malignancy with extracapsular extension. Patient was originally seen by Dr. [name] Dr. [name] and Dr. [name]. She has elected to have a right mastectomy eventually and declined biopsies of any other areas within the breast."  Her subsequent history is as detailed below.  INTERVAL HISTORY: Nicole Valentine returns today for followup of her breast cancer. The interval history is significant for her mother in [place] having been found to have HER-2 positive breast cancer. She is currently receiving chemotherapy, which she is tolerating well  REVIEW OF SYSTEMS: Nicole Valentine is doing fine as far as breast cancer is concerned. Her chief concern is her weight and she is planning to start an exercise program with her daughter. They do have equipment at home. We had discussed died at the last visit and she tells me she and her husband are eating more fish and chicken and more of a Mediterranean diet. I did not hear the word  "vegetables" while discussing that and we went over the fact that vegetables or we are the secret S. Otherwise I her hair is now back to normal. She is having hot flashes particularly at night, and has 2 daughters off of her covers 2 or 3 times, but she does not feel this is a major issue. She is not planning to have reconstruction for the next 2 years at least, until her children are a little bit older. A detailed review of systems today was otherwise negative  PAST MEDICAL HISTORY: Past Medical History  Diagnosis Date  . Contact lens/glasses fitting     wears contacts or glasses  . Complication of anesthesia     her father and uncle both had very difficult time waking up-was  something they told them may be hereditory. she will find out.  . Breast cancer dx'd 04-10-12-rt  . Thyroid disease   . Hypothyroidism   . 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 Radiation 01/21/13-03/06/13    Right Breast    PAST SURGICAL HISTORY: Past Surgical History  Procedure Laterality Date  . Wisdom tooth extraction    . Portacath placement  04/22/2012    Procedure: INSERTION PORT-A-CATH;  Surgeon: [name], MD;  Location: [facility];  Service: General;  Laterality: Left;  [name] Modified mastectomy Right 11/18/2012    Procedure:  RIGHT MODIFIED MASTECTOMY;  Surgeon: [name], MD;  Location: [place];  Service: General;  Laterality: Right;  . Jp drain      right breast    FAMILY HISTORY Family History  Problem  Relation Age of Onset  . Lung cancer Maternal Grandfather 13    smoker  . Prostate cancer Maternal Grandfather 90  . Throat cancer Other     Great Aunt x 2  . Liver cancer Other     Maternal Great Grandmother  . Melanoma Maternal Uncle 81  . Brain cancer Cousin 11    non-malignant  the patient's parents are living, in their early 43s. The patient has 2 brothers, no sisters. The patient's mother was diagnosed with breast cancer, HER-2 positive, and  2014.  GYNECOLOGIC HISTORY:  Menarche age 52, first live birth age 48. She is GX P4. Let [place] Mr. period was at the time of chemotherapy, December 2013. 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 They attend the [employer] DIRECTIVES: Not in place   HEALTH MAINTENANCE: History  Substance Use Topics  . Smoking status: Never Smoker   . Smokeless tobacco: Never Used  . Alcohol Use: No     Colonoscopy:  PAP:  Bone density:  Lipid panel:  Allergies  Allergen Reactions  . Almond Meal     *Pt states when she eats almonds, her lips "get itchy."    Current Outpatient Prescriptions  Medication Sig Dispense Refill  . calcium carbonate (OS-CAL - DOSED IN MG OF ELEMENTAL CALCIUM) 1250 MG tablet Take 1 tablet by mouth daily.      . Cholecalciferol (VITAMIN D-3 PO) Take 2,000 Int'l Units by mouth.      . Cyanocobalamin (VITAMIN B-12 PO) Take 5,000 mg by mouth daily.      . Ferrous Sulfate Dried (SLOW RELEASE IRON) 45 MG TBCR Take 65 mg by mouth daily.       [name] levothyroxine (SYNTHROID, LEVOTHROID) 200 MCG tablet 150 mcg.       . naproxen (NAPROSYN) 500 MG tablet Take 1 tablet (500 mg total) by mouth 2 (two) times daily with a meal.  40 tablet  1  . non-metallic deodorant (ALRA) MISC Apply 1 application topically daily. Apply after rad txs daily       No current facility-administered medications for this visit.    OBJECTIVE: Middle aged white woman in no acute distress Filed Vitals:   05/29/13 1558  BP: 116/81  Pulse: 71  Temp: 98.1 F (36.7 C)  Resp: 18     Body mass index is 28.92 kg/(m^2).    ECOG FS:0 - Asymptomatic  Sclerae unicteric, pupils round and equal  Oropharynx clear and moist No cervical or and specifically no right supraclavicular adenopathy  Lungs no rales or rhonchi  Heart regular rate and rhythm  Abd soft, nontender, positive bowel  sounds MSK no focal spinal tenderness, no peripheral edema  Neuro: non-focal, well-oriented, appropriate affect  Breasts: The right breast is status post mastectomy. There is no evidence of local recurrence. The right axillae is benign. The left breast is unremarkable.  LAB RESULTS:  CMP     Component Value Date/Time   NA 145 01/08/2013 1444   NA 142 11/15/2012 1215   K 4.1 01/08/2013 1444   K 3.7 11/15/2012 1215   CL 104 11/15/2012 1215   CL 101 10/18/2012 0859   CO2 30* 01/08/2013 1444   CO2 30 11/15/2012 1215   GLUCOSE 102 01/08/2013 1444   GLUCOSE 104* 11/15/2012 1215   GLUCOSE 83 10/18/2012 0859   BUN  15.1 01/08/2013 1444   BUN 8 11/15/2012 1215   CREATININE 0.8 01/08/2013 1444   CREATININE 0.67 11/15/2012 1215   CALCIUM 9.3 01/08/2013 1444   CALCIUM 9.3 11/15/2012 1215   PROT 7.0 01/08/2013 1444   ALBUMIN 3.6 01/08/2013 1444   AST 19 01/08/2013 1444   ALT 20 01/08/2013 1444   ALKPHOS 107 01/08/2013 1444   BILITOT 0.46 01/08/2013 1444   GFRNONAA >90 11/15/2012 1215   GFRAA >90 11/15/2012 1215    I No results found for this basename: SPEP,  UPEP,   kappa and lambda light chains    Lab Results  Component Value Date   WBC 4.9 01/08/2013   NEUTROABS 2.9 01/08/2013   HGB 14.1 01/08/2013   HCT 40.4 01/08/2013   MCV 103.0* 01/08/2013   PLT 142* 01/08/2013      Chemistry      Component Value Date/Time   NA 145 01/08/2013 1444   NA 142 11/15/2012 1215   K 4.1 01/08/2013 1444   K 3.7 11/15/2012 1215   CL 104 11/15/2012 1215   CL 101 10/18/2012 0859   CO2 30* 01/08/2013 1444   CO2 30 11/15/2012 1215   BUN 15.1 01/08/2013 1444   BUN 8 11/15/2012 1215   CREATININE 0.8 01/08/2013 1444   CREATININE 0.67 11/15/2012 1215      Component Value Date/Time   CALCIUM 9.3 01/08/2013 1444   CALCIUM 9.3 11/15/2012 1215   ALKPHOS 107 01/08/2013 1444   AST 19 01/08/2013 1444   ALT 20 01/08/2013 1444   BILITOT 0.46 01/08/2013 1444       Lab Results  Component Value Date   LABCA2 34 05/24/2012    No  components found with this basename: [id number]    No results found for this basename: INR,  in the last 168 hours  Urinalysis No results found for this basename: colorurine,  appearanceur,  labspec,  phurine,  glucoseu,  hgbur,  bilirubinur,  ketonesur,  proteinur,  urobilinogen,  nitrite,  leukocytesur    STUDIES: Repeat mammography due later this month  ASSESSMENT: 48 y.o. BRCA negative [place] woman  (1) an status post right breast upper outer quadrant and right axillary lymph node biopsy 04/04/2012, both positive for an invasive ductal carcinoma, grade 3, triple negative, with an MIB-1 of 25%  (2) Treated neoadjuvantly with  (a) fluorouracil, cyclophosphamide, and epirubicin (at 100 mg/M2) x4 completed 06/07/2012  (b) docetaxel (75 mg/M2) for one dose, 06/21/2012, poorly tolerated  (c) carboplatin and gemcitabine given every 21 days for 6 cycles completed 10/11/2012  (3) status post right modified radical mastectomy 11/18/2012 showing a complete pathologic response--all 16 lymph nodes were benign  (4) adjuvant radiation therapy completed 03/06/2013  (5) no immediate plans for reconstructon  PLAN: For has recovered nicely from her treatments and is successfully reentering  normal life. Of course the fact that her mother in [place] is undergoing breast cancer treatment does keep some of the concerns alive.  Today we discussed using gabapentin at bedtime for her nighttime hot flashes, but she feels that problem is not significant enough to warrant any intervention. We discussed exercise, and her plan is to use her equipment 20-30 minutes a day. We also discussed diet, and she and her husband already are modifying her diet and a good way. She in my opinion it needs to a little bit more attention to vegetables and she will look into somebody to cope works for some easy tasty recipes in that regard.  She does not want to really think about reconstruction until another couple of  years have passed. She also doesn't want to have the port removed until the summer, "when I have more time". Likely we will ask Dr. Barry Dienes to perform that, since the patient's surgeon, Dr. [name], has retired.  I am scheduling her for a DEX[facility] scan at [facility] a January 16, at the time of her next mammogram. She will see Dr. Tamala Julian in February and will have those results by then so they can discuss whether he would be a good idea for her to go on a bisphosphonate. She will return to see [place] here in May. The plan is to see her every 3 months until she completes 2 years of followup at which time we will start seeing her every 6 months. She has a good understanding of this plan and knows to call for any problems that may develop before her next visit here   Chauncey Cruel, MD   05/29/2013 4:06 PM

## 2013-06-02 ENCOUNTER — Telehealth: Payer: Self-pay | Admitting: Oncology

## 2013-06-02 NOTE — Telephone Encounter (Signed)
S/w pt re lb/AB 5/15 and bone density test @ [facility] 1/16 @ 8:30am w/mammo as scheduled 1/16 @ 9am.

## 2013-07-08 ENCOUNTER — Telehealth: Payer: Self-pay | Admitting: Oncology

## 2013-07-08 NOTE — Telephone Encounter (Signed)
, °

## 2013-07-30 ENCOUNTER — Telehealth: Payer: Self-pay | Admitting: *Deleted

## 2013-07-30 NOTE — Telephone Encounter (Signed)
Pt called for a flush appt. gv appt for flush 07/31/13@ 9am. Pt is aware...td

## 2013-07-31 ENCOUNTER — Ambulatory Visit (HOSPITAL_BASED_OUTPATIENT_CLINIC_OR_DEPARTMENT_OTHER): Payer: BC Managed Care – PPO

## 2013-07-31 VITALS — BP 119/58 | HR 79 | Temp 98.2°F

## 2013-07-31 DIAGNOSIS — C773 Secondary and unspecified malignant neoplasm of axilla and upper limb lymph nodes: Secondary | ICD-10-CM

## 2013-07-31 DIAGNOSIS — Z452 Encounter for adjustment and management of vascular access device: Secondary | ICD-10-CM

## 2013-07-31 DIAGNOSIS — C50419 Malignant neoplasm of upper-outer quadrant of unspecified female breast: Secondary | ICD-10-CM

## 2013-07-31 DIAGNOSIS — Z95828 Presence of other vascular implants and grafts: Secondary | ICD-10-CM

## 2013-07-31 MED ORDER — HEPARIN SOD (PORK) LOCK FLUSH 100 UNIT/ML IV SOLN
500.0000 [IU] | Freq: Once | INTRAVENOUS | Status: AC
  Administered 2013-07-31: 500 [IU] via INTRAVENOUS
  Filled 2013-07-31: qty 5

## 2013-07-31 MED ORDER — SODIUM CHLORIDE 0.9 % IJ SOLN
10.0000 mL | INTRAMUSCULAR | Status: DC | PRN
  Administered 2013-07-31: 10 mL via INTRAVENOUS
  Filled 2013-07-31: qty 10

## 2013-10-02 ENCOUNTER — Other Ambulatory Visit: Payer: BC Managed Care – PPO

## 2013-10-02 ENCOUNTER — Ambulatory Visit: Payer: BC Managed Care – PPO | Admitting: Physician Assistant

## 2013-10-03 ENCOUNTER — Telehealth: Payer: Self-pay | Admitting: Physician Assistant

## 2013-10-03 ENCOUNTER — Other Ambulatory Visit (HOSPITAL_BASED_OUTPATIENT_CLINIC_OR_DEPARTMENT_OTHER): Payer: BC Managed Care – PPO

## 2013-10-03 ENCOUNTER — Ambulatory Visit: Payer: BC Managed Care – PPO

## 2013-10-03 ENCOUNTER — Ambulatory Visit (HOSPITAL_BASED_OUTPATIENT_CLINIC_OR_DEPARTMENT_OTHER): Payer: BC Managed Care – PPO | Admitting: Physician Assistant

## 2013-10-03 ENCOUNTER — Encounter: Payer: Self-pay | Admitting: Physician Assistant

## 2013-10-03 VITALS — BP 127/84 | HR 76 | Temp 98.4°F | Resp 18 | Ht 67.5 in | Wt 194.8 lb

## 2013-10-03 DIAGNOSIS — C50919 Malignant neoplasm of unspecified site of unspecified female breast: Secondary | ICD-10-CM

## 2013-10-03 DIAGNOSIS — R079 Chest pain, unspecified: Secondary | ICD-10-CM

## 2013-10-03 DIAGNOSIS — Z853 Personal history of malignant neoplasm of breast: Secondary | ICD-10-CM

## 2013-10-03 DIAGNOSIS — K219 Gastro-esophageal reflux disease without esophagitis: Secondary | ICD-10-CM | POA: Insufficient documentation

## 2013-10-03 DIAGNOSIS — I890 Lymphedema, not elsewhere classified: Secondary | ICD-10-CM

## 2013-10-03 DIAGNOSIS — M25561 Pain in right knee: Secondary | ICD-10-CM | POA: Insufficient documentation

## 2013-10-03 DIAGNOSIS — Z9010 Acquired absence of unspecified breast and nipple: Secondary | ICD-10-CM

## 2013-10-03 DIAGNOSIS — C50411 Malignant neoplasm of upper-outer quadrant of right female breast: Secondary | ICD-10-CM

## 2013-10-03 DIAGNOSIS — Z171 Estrogen receptor negative status [ER-]: Secondary | ICD-10-CM

## 2013-10-03 DIAGNOSIS — Z95828 Presence of other vascular implants and grafts: Secondary | ICD-10-CM

## 2013-10-03 LAB — COMPREHENSIVE METABOLIC PANEL (CC13)
ALT: 15 U/L (ref 0–55)
AST: 20 U/L (ref 5–34)
Albumin: 3.6 g/dL (ref 3.5–5.0)
Alkaline Phosphatase: 117 U/L (ref 40–150)
Anion Gap: 10 mEq/L (ref 3–11)
BILIRUBIN TOTAL: 0.46 mg/dL (ref 0.20–1.20)
BUN: 11.8 mg/dL (ref 7.0–26.0)
CO2: 25 mEq/L (ref 22–29)
CREATININE: 0.7 mg/dL (ref 0.6–1.1)
Calcium: 9.2 mg/dL (ref 8.4–10.4)
Chloride: 107 mEq/L (ref 98–109)
Glucose: 118 mg/dl (ref 70–140)
Potassium: 3.6 mEq/L (ref 3.5–5.1)
Sodium: 142 mEq/L (ref 136–145)
Total Protein: 6.7 g/dL (ref 6.4–8.3)

## 2013-10-03 LAB — CBC WITH DIFFERENTIAL/PLATELET
BASO%: 0.5 % (ref 0.0–2.0)
BASOS ABS: 0 10*3/uL (ref 0.0–0.1)
EOS ABS: 0.1 10*3/uL (ref 0.0–0.5)
EOS%: 1.2 % (ref 0.0–7.0)
HEMATOCRIT: 40.1 % (ref 34.8–46.6)
HEMOGLOBIN: 13.6 g/dL (ref 11.6–15.9)
LYMPH#: 1.3 10*3/uL (ref 0.9–3.3)
LYMPH%: 23.3 % (ref 14.0–49.7)
MCH: 35.5 pg — AB (ref 25.1–34.0)
MCHC: 34 g/dL (ref 31.5–36.0)
MCV: 104.4 fL — AB (ref 79.5–101.0)
MONO#: 0.3 10*3/uL (ref 0.1–0.9)
MONO%: 4.9 % (ref 0.0–14.0)
NEUT#: 3.8 10*3/uL (ref 1.5–6.5)
NEUT%: 70.1 % (ref 38.4–76.8)
Platelets: 153 10*3/uL (ref 145–400)
RBC: 3.84 10*6/uL (ref 3.70–5.45)
RDW: 12.6 % (ref 11.2–14.5)
WBC: 5.4 10*3/uL (ref 3.9–10.3)

## 2013-10-03 MED ORDER — HEPARIN SOD (PORK) LOCK FLUSH 100 UNIT/ML IV SOLN
500.0000 [IU] | Freq: Once | INTRAVENOUS | Status: AC
  Administered 2013-10-03: 500 [IU] via INTRAVENOUS
  Filled 2013-10-03: qty 5

## 2013-10-03 MED ORDER — SODIUM CHLORIDE 0.9 % IJ SOLN
10.0000 mL | INTRAMUSCULAR | Status: DC | PRN
  Administered 2013-10-03: 10 mL via INTRAVENOUS
  Filled 2013-10-03: qty 10

## 2013-10-03 NOTE — Patient Instructions (Signed)

## 2013-10-03 NOTE — Telephone Encounter (Signed)
per pof to sch pt appt-ref to Dr Barry Dienes to have port removed-pt stated cannot plan  or sch now/does not have her calendar-she will call and sch

## 2013-10-03 NOTE — Progress Notes (Signed)
And a Patient ID: Nicole Valentine, female   DOB: 01-24-1966, 48 y.o.   MRN: [id number] ID: [name] OB: 07-16-1965  MR#: [id number]  CSN#:[id number]  PCP: [name], MD GYN:   SU: [name]); [name] OTHER MD: [name]  CHIEF COMPLAINT:  Hx of Right Breast Cancer (triple negative)   HISTORY OF PRESENT ILLNESS: From Doctor Khan's intake nodes 11/10/2012:  "She originally palpated a right breast mass. She had a mammogram performed that showed dense breasts bilaterally. Ultrasound of the right breast showed 2.3 x 1.9 cm area of abnormality with multiple abnormal lymph nodes. MRI of the bilateral breasts showed asymmetrical enhancement throughout the right breast consistent with multicentric disease. An ultrasound-guided biopsy performed. The known area of disease measured 1.9 x 1.9 x 2.3 cm. Multiple abnormal positive right axillary lymph nodes were noted. 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 Biopsy of the right axillary lymph node was positive for malignancy with extracapsular extension. Patient was originally seen by Dr. [name] Dr. [name] and Dr. [name]. She has elected to have a right mastectomy eventually and declined biopsies of any other areas within the breast."  Her subsequent history is as detailed below.  INTERVAL HISTORY: Nicole Valentine returns alone today for followup of her triple negative right breast cancer. The interval history is generally unremarkable, and overall, Nicole Valentine is feeling well. She staying very busy with her 4 children. She has had both a bone density and mammogram since her appointment here, both of which were unremarkable.   Of note, her mother who was diagnosed in [place] late last year with a HER-2/neu positive breast cancer has completed therapy, underwent lumpectomy, and is "doing well".   Nicole Valentine's only complaints are some increased reflux (which  sometimes causes substernal discomfort and is resolved with TUMS) and right knee pain. In fact, she has apparently been diagnosed with a torn right meniscus and anticipates having arthroscopic knee surgery soon.  She's also ready to have her port removed. It has not been flushed in approximately 8 weeks.   REVIEW OF SYSTEMS: Nicole Valentine denies any recent illnesses and has had no fevers, chills, or night sweats. She denies any rashes or skin changes and has had no abnormal bruising or bleeding. She has had no menstrual cycle since January 2014. Her energy level is good. Her appetite is good. Despite the reflux, she's had no problems at all with nausea or emesis and denies any change in bowel or bladder habits. She denies any cough, phlegm production, shortness of breath, peripheral swelling, chest pain, or palpitations. She has some right upper extremity lymphedema for which she wears a sleeve intermittently, and often notices some lymphedema in the right axillary region as well. This has been evaluated with our lymphedema clinic in the past. She's had no abnormal headaches or dizziness and denies any change in vision. She also denies any current myalgias, arthralgias, or bony pain other than the knee pain noted above.    A detailed review of systems today was otherwise negative  PAST MEDICAL HISTORY: Past Medical History  Diagnosis Date  . Contact lens/glasses fitting     wears contacts or glasses  . Complication of anesthesia     her father and uncle both had very difficult time waking up-was  something they told them may be hereditory. she will find out.  . Breast cancer dx'd 04-10-12-rt  . Thyroid disease   . Hypothyroidism   .  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 Radiation 01/21/13-03/06/13    Right Breast    PAST SURGICAL HISTORY: Past Surgical History  Procedure Laterality Date  . Wisdom tooth extraction    . Portacath placement  04/22/2012    Procedure: INSERTION PORT-A-CATH;  Surgeon:  [name], MD;  Location: [place];  Service: General;  Laterality: Left;  [name] Modified mastectomy Right 11/18/2012    Procedure:  RIGHT MODIFIED MASTECTOMY;  Surgeon: [name], MD;  Location: [place];  Service: General;  Laterality: Right;  . Jp drain      right breast    FAMILY HISTORY Family History  Problem Relation Age of Onset  . Lung cancer Maternal Grandfather 68    smoker  . Prostate cancer Maternal Grandfather 90  . Throat cancer Other     Great Aunt x 2  . Liver cancer Other     Maternal Great Grandmother  . Melanoma Maternal Uncle 81  . Brain cancer Cousin 11    non-malignant  the patient's parents are living, in their early 96s. The patient has 2 brothers, no sisters. The patient's mother was diagnosed with breast cancer, HER-2 positive, in 2014 in [place].  GYNECOLOGIC HISTORY:   (Updated 10/03/2013) Menarche age 74, first live birth age 69. She is GX P4. LMP January 2014.  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  The children are currently ages 80, 53, 46, and 46. Her husband, [name], is a Customer service manager. They attend the [employer]: Not in place   HEALTH MAINTENANCE:  (Updated 10/03/2013) History  Substance Use Topics  . Smoking status: Never Smoker   . Smokeless tobacco: Never Used  . Alcohol Use: No     Colonoscopy: Never  PAP: November 2013/Dr. Smith  Bone density: January 2015, [facility], normal  Lipid panel:  Not on file  Allergies  Allergen Reactions  . Almond Meal     *Pt states when she eats almonds, her lips "get itchy."    Current Outpatient Prescriptions  Medication Sig Dispense Refill  . Glucosamine-Chondroitin (GLUCOSAMINE CHONDR COMPLEX PO) Take 1 tablet by mouth 3 (three) times daily before meals.      [name] levothyroxine (SYNTHROID, LEVOTHROID) 200 MCG tablet 150 mcg.       . meloxicam (MOBIC) 7.5 MG tablet Take 7.5 mg by  mouth daily.      . Omega-3 Fatty Acids (FISH OIL PO) Take 1 capsule by mouth 5 (five) times daily.      . TURMERIC PO Take 1 capsule by mouth 3 (three) times daily.      . calcium carbonate (OS-CAL - DOSED IN MG OF ELEMENTAL CALCIUM) 1250 MG tablet Take 1 tablet by mouth daily.      . Cholecalciferol (VITAMIN D-3 PO) Take 2,000 Int'l Units by mouth.      . Cyanocobalamin (VITAMIN B-12 PO) Take 5,000 mg by mouth daily.      . Ferrous Sulfate Dried (SLOW RELEASE IRON) 45 MG TBCR Take 65 mg by mouth daily.       . naproxen (NAPROSYN) 500 MG tablet Take 1 tablet (500 mg total) by mouth 2 (two) times daily with a meal.  40 tablet  1  . non-metallic deodorant (ALRA) MISC Apply 1 application topically daily. Apply after rad txs daily       No current facility-administered medications for this visit.  Facility-Administered Medications Ordered in Other Visits  Medication Dose Route Frequency Provider Last Rate Last Dose  . sodium chloride 0.9 % injection 10 mL  10 mL Intravenous PRN Theotis Burrow, PA-C   10 mL at 10/03/13 1502    OBJECTIVE: Middle aged white woman who appears well  Filed Vitals:   10/03/13 1338  BP: 127/84  Pulse: 76  Temp: 98.4 F (36.9 C)  Resp: 18     Body mass index is 30.04 kg/(m^2).    ECOG FS:0 - Asymptomatic Filed Weights   10/03/13 1338  Weight: 194 lb 12.8 oz (88.361 kg)   Physical Exam: HEENT:  Sclerae anicteric.  Oropharynx clear, pink, and moist. Neck supple, trachea midline.  NODES:  No cervical or supraclavicular lymphadenopathy palpated.  BREAST EXAM:  Patient is status post right mastectomy, with no suspicious nodularities or skin changes. No evidence of local recurrence in the chest wall. Left breast is unremarkable. There is some mild lymphedema noted in the right axillary region, but otherwise the axillae are benign, with no palpable lymphadenopathy. LUNGS:  Clear to auscultation bilaterally with good excursion.  No crackles, wheezes or rhonchi HEART:   Regular rate and rhythm. No murmur appreciated. ABDOMEN:  Soft, nontender.  Negative Murphy's sign. No organomegaly or masses palpated.  Positive bowel sounds.  MSK:  No focal spinal tenderness to palpation. Full range of motion bilaterally in the upper extremities, although the right upper shoulder feels "tight". EXTREMITIES:  No peripheral edema. Nonpitting lymphedema in the right upper extremity.  SKIN:  No visible rashes. No excessive ecchymoses. No petechiae. No pallor. Good skin turgor. NEURO:  Nonfocal. Well oriented.  Appropriate affect.    LAB RESULTS:     Lab Results  Component Value Date   WBC 5.4 10/03/2013   NEUTROABS 3.8 10/03/2013   HGB 13.6 10/03/2013   HCT 40.1 10/03/2013   MCV 104.4* 10/03/2013   PLT 153 10/03/2013      Chemistry      Component Value Date/Time   NA 142 10/03/2013 1322   NA 142 11/15/2012 1215   K 3.6 10/03/2013 1322   K 3.7 11/15/2012 1215   CL 104 11/15/2012 1215   CL 101 10/18/2012 0859   CO2 25 10/03/2013 1322   CO2 30 11/15/2012 1215   BUN 11.8 10/03/2013 1322   BUN 8 11/15/2012 1215   CREATININE 0.7 10/03/2013 1322   CREATININE 0.67 11/15/2012 1215      Component Value Date/Time   CALCIUM 9.2 10/03/2013 1322   CALCIUM 9.3 11/15/2012 1215   ALKPHOS 117 10/03/2013 1322   AST 20 10/03/2013 1322   ALT 15 10/03/2013 1322   BILITOT 0.46 10/03/2013 1322       STUDIES:  A bone density at [facility] on 06/06/2013 was normal.  A left mammogram at [facility] on 06/06/2013 was unremarkable with the exception of the dense breast tissue.    ASSESSMENT: 48 y.o. BRCA negative [place] woman  (1) an status post right breast upper outer quadrant and right axillary lymph node biopsy 04/04/2012, both positive for an invasive ductal carcinoma, grade 3, triple negative, with an MIB-1 of 25%  (2) Treated neoadjuvantly with  (a) fluorouracil, cyclophosphamide, and epirubicin (at 100 mg/M2) x4 completed 06/07/2012  (b) docetaxel (75 mg/M2) for one dose, 06/21/2012,  poorly tolerated  (c) carboplatin and gemcitabine given every 21 days for 6 cycles completed 10/11/2012  (3) status post right modified radical mastectomy 11/18/2012 showing a complete pathologic response--all 16 lymph nodes were  benign  (4) adjuvant radiation therapy completed 03/06/2013  (5) no immediate plans for reconstructon  PLAN: With regards to her breast cancer, Marissah seems to be doing well. There is no clinical evidence of disease recurrence at this time, and we will continue to see her on a every 3 month basis with close followup. Accordingly, she will return in August for repeat labs and physical exam.   We will flush her port today, and I referring her back to Dr. Barry Dienes to have her port removed at the next available appointment.   I have advised her to pay close attention to the substernal pain she is experiencing. This does sound like it is consistent with reflux since that, helps immediately. Also encouraged her to try over-the-counter omeprazole, but if this continues she needs to followup with her primary care physician. Of course she also understands that if the pain occurs, worsens, and does not resolve, she would need to proceed to the Emergency Department for further evaluation.   The plan is to see her every 3 months until she completes 2 years of followup at which time we will start seeing her every 6 months. she voices understanding and agreement with the above plan. She knows to call with any changes or problems prior to her next appointment in August.   Theotis Burrow, PA-C   10/03/2013 5:03 PM

## 2013-10-10 ENCOUNTER — Other Ambulatory Visit: Payer: Self-pay | Admitting: Adult Health

## 2013-10-14 ENCOUNTER — Telehealth: Payer: Self-pay | Admitting: *Deleted

## 2013-10-14 NOTE — Telephone Encounter (Signed)
Pt called this RN tos state onset of tender lump under left arm assessed by Dr Carol Ada at the [facility] In clinic on 5/22.  " she felt it was an infection and gave me an abx to start if I developed a fever- which I did " " today Dr Tamala Julian called me to advise me to continue to take the complete 10 day course of the abx but for me to contact your office for peace of mind ".  This RN reviewed above with pt who states the lump is under the left arm at the bend- and is approximately the size of a grape.  Area is tender and warm to touch.  Per discussion plan is for Nicole Valentine to complete dose of ABX- and call this RN on Monday 6/1 when she completes to update this RN.  If area not resolved additional work up can be done.  Per discussion pt understands to call if symptoms worsen before then. Also noted appointment with Dr Dalbert Batman next week for follow up per Dr Dickie La retirement- and if area still a concern he could assess as well.

## 2013-10-22 ENCOUNTER — Encounter (INDEPENDENT_AMBULATORY_CARE_PROVIDER_SITE_OTHER): Payer: Self-pay | Admitting: General Surgery

## 2013-10-22 ENCOUNTER — Ambulatory Visit (INDEPENDENT_AMBULATORY_CARE_PROVIDER_SITE_OTHER): Payer: BC Managed Care – PPO | Admitting: General Surgery

## 2013-10-22 VITALS — BP 116/76 | HR 78 | Temp 97.5°F | Resp 12 | Ht 67.0 in | Wt 189.8 lb

## 2013-10-22 DIAGNOSIS — C50419 Malignant neoplasm of upper-outer quadrant of unspecified female breast: Secondary | ICD-10-CM

## 2013-10-22 DIAGNOSIS — C50411 Malignant neoplasm of upper-outer quadrant of right female breast: Secondary | ICD-10-CM

## 2013-10-22 NOTE — Patient Instructions (Signed)
The recent right submandibular pain and left axillary pain sounds like a low-grade bacterial infection. Fortunately this has now resolved after the course of antibiotics. There is no evidence of cancer.  Examination of your right mastectomy wound and left breast and left axilla are normal.  We will schedule removal of the left sided Port-A-Cath in the near future under local anesthesia at your convenience.  Be sure to get mammograms annually in January

## 2013-10-22 NOTE — Progress Notes (Signed)
Patient ID: Nicole Valentine, female   DOB: [birth year], 48 y.o.   MRN: [id number]  No chief complaint on file.   HPI Nicole Valentine is a 48 y.o. female.  This is Dr. Dickie La patient who underwent neoadjuvant chemotherapy, MRM., postop radiation therapy for invasive carcinoma, stage II, initially diagnosed in November 2013. She is followed by Dr. [name]. Dr. Carol Ada is her PCP.  She recently saw Micah Flesher and was referred for elective removal of her left infraclavicular Port-A-Cath  In the interim she developed some right submandibular pain and swelling and left axillary pain and swelling. She had low-grade fever and was given a 12 day course of Augmentin and says that this is now completely resolved. She has no swelling and no pain. She has no complaints about her right mastectomy wound or left breast.  Last mammogram of the left breast was January 2015 at [facility] and iscategory 1. No abnormality HPI  Past Medical History  Diagnosis Date  . Contact lens/glasses fitting     wears contacts or glasses  . Complication of anesthesia     her father and uncle both had very difficult time waking up-was  something they told them may be hereditory. she will find out.  . Breast cancer dx'd 04-10-12-rt  . Thyroid disease   . Hypothyroidism   . 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 Radiation 01/21/13-03/06/13    Right Breast    Past Surgical History  Procedure Laterality Date  . Wisdom tooth extraction    . Portacath placement  04/22/2012    Procedure: INSERTION PORT-A-CATH;  Surgeon: Zaiah Credeur Lasso, MD;  Location: [place];  Service: General;  Laterality: Left;  [name] Modified mastectomy Right 11/18/2012    Procedure:  RIGHT MODIFIED MASTECTOMY;  Surgeon: Demetrius Mahler Lasso, MD;  Location: [place];  Service: General;  Laterality: Right;  . Jp drain      right breast    Family History  Problem Relation Age of Onset  . Lung cancer Maternal Grandfather 2     smoker  . Prostate cancer Maternal Grandfather 90  . Throat cancer Other     Great Aunt x 2  . Liver cancer Other     Maternal Great Grandmother  . Melanoma Maternal Uncle 81  . Brain cancer Cousin 11    non-malignant    Social History History  Substance Use Topics  . Smoking status: Never Smoker   . Smokeless tobacco: Never Used  . Alcohol Use: No    Allergies  Allergen Reactions  . Almond Meal     *Pt states when she eats almonds, her lips "get itchy."    Current Outpatient Prescriptions  Medication Sig Dispense Refill  . amoxicillin-clavulanate (AUGMENTIN) 875-125 MG per tablet       . calcium carbonate (OS-CAL - DOSED IN MG OF ELEMENTAL CALCIUM) 1250 MG tablet Take 1 tablet by mouth daily.      . Cholecalciferol (VITAMIN D-3 PO) Take 2,000 Int'l Units by mouth.      . Cyanocobalamin (VITAMIN B-12 PO) Take 5,000 mg by mouth daily.      . Ferrous Sulfate Dried (SLOW RELEASE IRON) 45 MG TBCR Take 65 mg by mouth daily.       . Glucosamine-Chondroitin (GLUCOSAMINE CHONDR COMPLEX PO) Take 1 tablet by mouth 3 (three) times daily before meals.      [name] levothyroxine (SYNTHROID, LEVOTHROID) 200 MCG tablet 150 mcg.       [name]  meloxicam (MOBIC) 7.5 MG tablet Take 7.5 mg by mouth daily.      . naproxen (NAPROSYN) 500 MG tablet Take 1 tablet (500 mg total) by mouth 2 (two) times daily with a meal.  40 tablet  1  . non-metallic deodorant (ALRA) MISC Apply 1 application topically daily. Apply after rad txs daily      . Omega-3 Fatty Acids (FISH OIL PO) Take 1 capsule by mouth 5 (five) times daily.      . TURMERIC PO Take 1 capsule by mouth 3 (three) times daily.       No current facility-administered medications for this visit.    Review of Systems Review of Systems  Constitutional: Negative for fever, chills and unexpected weight change.  HENT: Negative for congestion, hearing loss, sore throat, trouble swallowing and voice change.   Eyes: Negative for visual disturbance.   Respiratory: Negative for cough and wheezing.   Cardiovascular: Negative for chest pain, palpitations and leg swelling.  Gastrointestinal: Negative for nausea, vomiting, abdominal pain, diarrhea, constipation, blood in stool, abdominal distention and anal bleeding.  Genitourinary: Negative for hematuria, vaginal bleeding and difficulty urinating.  Musculoskeletal: Negative for arthralgias.  Skin: Negative for rash and wound.  Neurological: Negative for seizures, syncope and headaches.  Hematological: Negative for adenopathy. Does not bruise/bleed easily.  Psychiatric/Behavioral: Negative for confusion.    Blood pressure 116/76, pulse 78, temperature 97.5 F (36.4 C), resp. rate 12, height 5\' 7"  (1.702 m), weight 189 lb 12.8 oz (86.093 kg).  Physical Exam Physical Exam  Constitutional: She is oriented to person, place, and time. She appears well-developed and well-nourished. No distress.  HENT:  Head: Normocephalic and atraumatic.  Nose: Nose normal.  Mouth/Throat: No oropharyngeal exudate.  Eyes: Conjunctivae and EOM are normal. Pupils are equal, round, and reactive to light. Left eye exhibits no discharge. No scleral icterus.  Neck: Neck supple. No JVD present. No tracheal deviation present. No thyromegaly present.  Nontender. No cervical adenopathy. No submandibular mass.  Cardiovascular: Normal rate, regular rhythm, normal heart sounds and intact distal pulses.   No murmur heard. Pulmonary/Chest: Effort normal and breath sounds normal. No respiratory distress. She has no wheezes. She has no rales. She exhibits no tenderness.  Right mastectomy wound is completely healed. No nodules or ulceration. No axillary adenopathy. Left breast reveals no skin changes no palpable mass no axillary adenopathy. No tenderness in the left axilla. Left infraclavicular Port-A-Cath is palpable. No signs of fluid or infection.  Lymphadenopathy:    She has no cervical adenopathy.  Neurological: She is  alert and oriented to person, place, and time. She exhibits normal muscle tone. Coordination normal.  Skin: Skin is warm. No rash noted. She is not diaphoretic. No erythema. No pallor.  Psychiatric: She has a normal mood and affect. Her behavior is normal. Judgment and thought content normal.    Data Reviewed Cancer center notes. Mammograms. Our records.  Assessment    Stage II, invasive ductal carcinoma right breast, initial diagnosis November 2013. Status neoadjuvant chemotherapy, MRM., radiation. No evidence of recurrence to date   desires Port-A-Cath removal electively Recent tender adenopathy right neck and left axilla, resolved on antibodies     Plan    We talked about doing a Port-A-Cath removal under local anesthesia or under sedation. It was her desire to have this done under local anesthesia only. We will schedule this at [facility] day surgery center in the near future. I discussed the indications, details, techniques, and numerous risk of the  surgery with her. She understands all these issues. At this time all of her questions are answered. She agrees with this plan.  Annual mammography and annual breast exam in January.       [name] 10/22/2013, 11:36 AM

## 2013-11-10 ENCOUNTER — Telehealth (INDEPENDENT_AMBULATORY_CARE_PROVIDER_SITE_OTHER): Payer: Self-pay

## 2013-11-10 ENCOUNTER — Encounter (INDEPENDENT_AMBULATORY_CARE_PROVIDER_SITE_OTHER): Payer: BC Managed Care – PPO | Admitting: General Surgery

## 2013-11-10 NOTE — Telephone Encounter (Signed)
LMOM> cancelled appt since pt already scheduled for port removal.

## 2013-11-26 HISTORY — PX: KNEE ARTHROSCOPY: SUR90

## 2013-12-05 ENCOUNTER — Encounter (HOSPITAL_BASED_OUTPATIENT_CLINIC_OR_DEPARTMENT_OTHER): Payer: Self-pay | Admitting: *Deleted

## 2013-12-08 NOTE — H&P (Signed)
Nicole Valentine   MRN:  [id number]   Description: 48 year old female  Provider: [name], MD  Department: [facility]       Diagnoses      Breast cancer of upper-outer quadrant of right female breast    -  Primary      174.4          Current Vitals Most recent update: 10/22/2013 11:10 AM by Elwyn Lade, CMA      BP Pulse Temp(Src) Resp Ht Wt      116/76 78 97.5 F (36.4 C) 12 5\' 7"  (1.702 m) 189 lb 12.8 oz (86.093 kg)      BMI  29.72 kg/m2                   History and Physical   [name], MD      Status: Signed            Patient ID: Nicole Valentine, female   DOB: 06/25/1965, 48 y.o.   MRN: [id number]             HPI Nicole Valentine is a 48 y.o. female.  This is Dr. Dickie La patient who underwent neoadjuvant chemotherapy, MRM., postop radiation therapy for invasive carcinoma, stage II, initially diagnosed in November 2013. She is followed by Dr. [name]. Dr. Carol Ada is her PCP.  She recently saw Micah Flesher and was referred for elective removal of her left infraclavicular Port-A-Cath   In the interim she developed some right submandibular pain and swelling and left axillary pain and swelling. She had low-grade fever and was given a 12 day course of Augmentin and says that this is now completely resolved. She has no swelling and no pain. She has no complaints about her right mastectomy wound or left breast.  Last mammogram of the left breast was January 2015 at [facility] and iscategory 1. No abnormality        Past Medical History   Diagnosis  Date   .  Contact lens/glasses fitting         wears contacts or glasses   .  Complication of anesthesia         her father and uncle both had very difficult time waking up-was  something they told them may be hereditory. she will find out.   .  Breast cancer  dx'd 04-10-12-rt   .  Thyroid disease     .  Hypothyroidism     .  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  Radiation  01/21/13-03/06/13        Right Breast         Past Surgical History   Procedure  Laterality  Date   .  Wisdom tooth extraction       .  Portacath placement    04/22/2012       Procedure: INSERTION PORT-A-CATH;  Surgeon: Aleda Madl Lasso, MD;  Location: [place];  Service: General;  Laterality: Left;   [name]  Modified mastectomy  Right  11/18/2012       Procedure:  RIGHT MODIFIED MASTECTOMY;  Surgeon: Bernestine Holsapple Lasso, MD;  Location: [place];  Service: General;  Laterality: Right;   .  Jp drain           right breast         Family History   Problem  Relation  Age of  Onset   .  Lung cancer  Maternal Grandfather  95       smoker   .  Prostate cancer  Maternal Grandfather  90   .  Throat cancer  Other         Great Aunt x 2   .  Liver cancer  Other         Maternal Great Grandmother   .  Melanoma  Maternal Uncle  81   .  Brain cancer  Cousin  11       non-malignant        Social History History   Substance Use Topics   .  Smoking status:  Never Smoker    .  Smokeless tobacco:  Never Used   .  Alcohol Use:  No         Allergies   Allergen  Reactions   .  Almond Meal         *Pt states when she eats almonds, her lips "get itchy."         Current Outpatient Prescriptions   Medication  Sig  Dispense  Refill   .  amoxicillin-clavulanate (AUGMENTIN) 875-125 MG per tablet           .  calcium carbonate (OS-CAL - DOSED IN MG OF ELEMENTAL CALCIUM) 1250 MG tablet  Take 1 tablet by mouth daily.         .  Cholecalciferol (VITAMIN D-3 PO)  Take 2,000 Int'l Units by mouth.         .  Cyanocobalamin (VITAMIN B-12 PO)  Take 5,000 mg by mouth daily.         .  Ferrous Sulfate Dried (SLOW RELEASE IRON) 45 MG TBCR  Take 65 mg by mouth daily.          .  Glucosamine-Chondroitin (GLUCOSAMINE CHONDR COMPLEX PO)  Take 1 tablet by mouth 3 (three) times daily before meals.         [name]  levothyroxine (SYNTHROID, LEVOTHROID) 200 MCG tablet  150 mcg.          .  meloxicam  (MOBIC) 7.5 MG tablet  Take 7.5 mg by mouth daily.         .  naproxen (NAPROSYN) 500 MG tablet  Take 1 tablet (500 mg total) by mouth 2 (two) times daily with a meal.   40 tablet   1   .  non-metallic deodorant (ALRA) MISC  Apply 1 application topically daily. Apply after rad txs daily         .  Omega-3 Fatty Acids (FISH OIL PO)  Take 1 capsule by mouth 5 (five) times daily.         .  TURMERIC PO  Take 1 capsule by mouth 3 (three) times daily.                Review of Systems   Constitutional: Negative for fever, chills and unexpected weight change.  HENT: Negative for congestion, hearing loss, sore throat, trouble swallowing and voice change.   Eyes: Negative for visual disturbance.  Respiratory: Negative for cough and wheezing.   Cardiovascular: Negative for chest pain, palpitations and leg swelling.  Gastrointestinal: Negative for nausea, vomiting, abdominal pain, diarrhea, constipation, blood in stool, abdominal distention and anal bleeding.  Genitourinary: Negative for hematuria, vaginal bleeding and difficulty urinating.  Musculoskeletal: Negative for arthralgias.  Skin: Negative for rash and wound.  Neurological: Negative for seizures, syncope and headaches.  Hematological:  Negative for adenopathy. Does not bruise/bleed easily.  Psychiatric/Behavioral: Negative for confusion.      Blood pressure 116/76, pulse 78, temperature 97.5 F (36.4 C), resp. rate 12, height 5\' 7"  (1.702 m), weight 189 lb 12.8 oz (86.093 kg).   Physical Exam  Constitutional: She is oriented to person, place, and time. She appears well-developed and well-nourished. No distress.  HENT:   Head: Normocephalic and atraumatic.   Nose: Nose normal.   Mouth/Throat: No oropharyngeal exudate.  Eyes: Conjunctivae and EOM are normal. Pupils are equal, round, and reactive to light. Left eye exhibits no discharge. No scleral icterus.  Neck: Neck supple. No JVD present. No tracheal deviation present. No  thyromegaly present.  Nontender. No cervical adenopathy. No submandibular mass.  Cardiovascular: Normal rate, regular rhythm, normal heart sounds and intact distal pulses.    No murmur heard. Pulmonary/Chest: Effort normal and breath sounds normal. No respiratory distress. She has no wheezes. She has no rales. She exhibits no tenderness.  Right mastectomy wound is completely healed. No nodules or ulceration. No axillary adenopathy. Left breast reveals no skin changes no palpable mass no axillary adenopathy. No tenderness in the left axilla. Left infraclavicular Port-A-Cath is palpable. No signs of fluid or infection.  Lymphadenopathy:    She has no cervical adenopathy.  Neurological: She is alert and oriented to person, place, and time. She exhibits normal muscle tone. Coordination normal.  Skin: Skin is warm. No rash noted. She is not diaphoretic. No erythema. No pallor.  Psychiatric: She has a normal mood and affect. Her behavior is normal. Judgment and thought content normal.      Data Reviewed Cancer center notes. Mammograms. Our records.   Assessment    Stage II, invasive ductal carcinoma right breast, initial diagnosis November 2013. Status neoadjuvant chemotherapy, MRM., radiation. No evidence of recurrence to date   desires Port-A-Cath removal electively Recent tender adenopathy right neck and left axilla, resolved on antibodies      Plan     We talked about doing a Port-A-Cath removal under local anesthesia or under sedation. It was her desire to have this done under local anesthesia only. We will schedule this at [facility] day surgery center in the near future. I discussed the indications, details, techniques, and numerous risk of the surgery with her. She understands all these issues. At this time all of her questions are answered. She agrees with this plan.   Annual mammography and annual breast exam in January.         Edsel Petrin. Dalbert Batman, M.D., [facility] General and Minimally invasive Surgery Breast and Colorectal Surgery Office:   [phone] Pager:   [phone]

## 2013-12-10 ENCOUNTER — Encounter (HOSPITAL_BASED_OUTPATIENT_CLINIC_OR_DEPARTMENT_OTHER)
Admission: RE | Disposition: A | Discharge status: Home / Self Care | Payer: Self-pay | Source: Ambulatory Visit | Attending: General Surgery

## 2013-12-10 ENCOUNTER — Ambulatory Visit (HOSPITAL_BASED_OUTPATIENT_CLINIC_OR_DEPARTMENT_OTHER)
Admission: RE | Admit: 2013-12-10 | Discharge: 2013-12-10 | Disposition: A | Discharge status: Home / Self Care | Payer: BC Managed Care – PPO | Source: Ambulatory Visit | Attending: General Surgery | Admitting: General Surgery

## 2013-12-10 ENCOUNTER — Encounter (HOSPITAL_BASED_OUTPATIENT_CLINIC_OR_DEPARTMENT_OTHER): Payer: Self-pay | Admitting: *Deleted

## 2013-12-10 DIAGNOSIS — C50411 Malignant neoplasm of upper-outer quadrant of right female breast: Secondary | ICD-10-CM | POA: Diagnosis present

## 2013-12-10 DIAGNOSIS — L272 Dermatitis due to ingested food: Secondary | ICD-10-CM | POA: Insufficient documentation

## 2013-12-10 DIAGNOSIS — Z853 Personal history of malignant neoplasm of breast: Secondary | ICD-10-CM | POA: Insufficient documentation

## 2013-12-10 DIAGNOSIS — Z800 Family history of malignant neoplasm of digestive organs: Secondary | ICD-10-CM | POA: Insufficient documentation

## 2013-12-10 DIAGNOSIS — Z923 Personal history of irradiation: Secondary | ICD-10-CM | POA: Insufficient documentation

## 2013-12-10 DIAGNOSIS — Z452 Encounter for adjustment and management of vascular access device: Secondary | ICD-10-CM

## 2013-12-10 DIAGNOSIS — Z9010 Acquired absence of unspecified breast and nipple: Secondary | ICD-10-CM | POA: Insufficient documentation

## 2013-12-10 DIAGNOSIS — E039 Hypothyroidism, unspecified: Secondary | ICD-10-CM | POA: Insufficient documentation

## 2013-12-10 DIAGNOSIS — Z801 Family history of malignant neoplasm of trachea, bronchus and lung: Secondary | ICD-10-CM | POA: Insufficient documentation

## 2013-12-10 DIAGNOSIS — Z79899 Other long term (current) drug therapy: Secondary | ICD-10-CM | POA: Insufficient documentation

## 2013-12-10 DIAGNOSIS — Z9221 Personal history of antineoplastic chemotherapy: Secondary | ICD-10-CM | POA: Insufficient documentation

## 2013-12-10 HISTORY — PX: PORT-A-CATH REMOVAL: SHX5289

## 2013-12-10 SURGERY — MINOR REMOVAL PORT-A-CATH
Anesthesia: LOCAL | Site: Chest | Laterality: Left

## 2013-12-10 MED ORDER — BUPIVACAINE-EPINEPHRINE (PF) 0.5% -1:200000 IJ SOLN
INTRAMUSCULAR | Status: AC
  Filled 2013-12-10: qty 30

## 2013-12-10 MED ORDER — BUPIVACAINE-EPINEPHRINE (PF) 0.5% -1:200000 IJ SOLN
INTRAMUSCULAR | Status: AC
  Filled 2013-12-10: qty 1.8

## 2013-12-10 MED ORDER — SODIUM BICARBONATE 4 % IV SOLN
INTRAVENOUS | Status: AC
  Filled 2013-12-10: qty 5

## 2013-12-10 MED ORDER — CHLORHEXIDINE GLUCONATE 4 % EX LIQD: 1.0000 "application " | Freq: Once | CUTANEOUS | Status: DC

## 2013-12-10 SURGICAL SUPPLY — 34 items
BENZOIN TINCTURE PRP APPL 2/3 (GAUZE/BANDAGES/DRESSINGS) IMPLANT
BLADE SURG 15 STRL LF DISP TIS (BLADE) ×1 IMPLANT
BLADE SURG 15 STRL SS (BLADE) ×1
CHLORAPREP W/TINT 26ML (MISCELLANEOUS) ×2 IMPLANT
CONT SPEC 4OZ CLIKSEAL STRL BL (MISCELLANEOUS) IMPLANT
COVER SURGICAL LIGHT HANDLE (MISCELLANEOUS) ×2 IMPLANT
DERMABOND ADVANCED (GAUZE/BANDAGES/DRESSINGS) ×1
DERMABOND ADVANCED .7 DNX12 (GAUZE/BANDAGES/DRESSINGS) ×1 IMPLANT
DRAPE SURG 17X23 STRL (DRAPES) IMPLANT
ELECT REM PT RETURN 9FT ADLT (ELECTROSURGICAL)
ELECTRODE REM PT RTRN 9FT ADLT (ELECTROSURGICAL) IMPLANT
GAUZE SPONGE 4X4 16PLY XRAY LF (GAUZE/BANDAGES/DRESSINGS) ×2 IMPLANT
GLOVE BIO SURGEON STRL SZ7 (GLOVE) ×2 IMPLANT
GLOVE EUDERMIC 7 POWDERFREE (GLOVE) ×2 IMPLANT
GLOVE EXAM NITRILE MD LF STRL (GLOVE) ×2 IMPLANT
MARKER SKIN DUAL TIP RULER LAB (MISCELLANEOUS) ×2 IMPLANT
NDL SAFETY ECLIPSE 18X1.5 (NEEDLE) ×1 IMPLANT
NEEDLE HYPO 18GX1.5 SHARP (NEEDLE) ×1
NEEDLE HYPO 25X1 1.5 SAFETY (NEEDLE) ×2 IMPLANT
NS IRRIG 1000ML POUR BTL (IV SOLUTION) IMPLANT
PENCIL BUTTON HOLSTER BLD 10FT (ELECTRODE) IMPLANT
SPONGE GAUZE 4X4 12PLY STER LF (GAUZE/BANDAGES/DRESSINGS) IMPLANT
STRIP CLOSURE SKIN 1/2X4 (GAUZE/BANDAGES/DRESSINGS) IMPLANT
SUT MNCRL AB 3-0 PS2 18 (SUTURE) IMPLANT
SUT MNCRL AB 4-0 PS2 18 (SUTURE) ×2 IMPLANT
SUT VIC AB 3-0 SH 27 (SUTURE) ×1
SUT VIC AB 3-0 SH 27X BRD (SUTURE) ×1 IMPLANT
SUT VIC AB 4-0 P-3 18XBRD (SUTURE) IMPLANT
SUT VIC AB 4-0 P3 18 (SUTURE)
SUT VIC AB 5-0 P-3 18X BRD (SUTURE) IMPLANT
SUT VIC AB 5-0 P3 18 (SUTURE)
SUT VICRYL 3-0 CR8 SH (SUTURE) IMPLANT
SYRINGE CONTROL L 12CC (SYRINGE) ×2 IMPLANT
TOWEL OR 17X24 6PK STRL BLUE (TOWEL DISPOSABLE) IMPLANT

## 2013-12-10 NOTE — Op Note (Signed)
Patient Name:           Nicole Valentine   Date of Surgery:        12/10/2013  Pre op Diagnosis:      Stage II, invasive ductal carcinoma right breast,  Status neoadjuvant chemotherapy, MRM., radiation.   No evidence of recurrence to date  desires Port-A-Cath removal electively   Post op Diagnosis:    Same  Procedure:                 Removal of Port-A-Cath  Surgeon:                     Edsel Petrin. Dalbert Batman, M.D., FACS  Assistant:                      None  Operative Indications:   [name] is a 48 y.o. female. She  underwent neoadjuvant chemotherapy, MRM., postop radiation therapy for invasive carcinoma, stage II, initially diagnosed in November 2013. She is followed by Dr. [name]. Dr. Carol Ada is her PCP.  She recently saw Micah Flesher and was referred for elective removal of her left infraclavicular Port-A-Cath  She has no complaints about her right mastectomy wound or left breast. Examination of her breasts and regional lymph nodes is unremarkable. Last mammogram of the left breast was January 2015 at [facility] and iscategory 1. No abnormality She is brought to the operating room electively for removal of her Port-A-Cath under local anesthesia   Operative Findings:       The port and catheter were removed intact without difficulty. There was no evidence of infection. There was no bleeding.  Procedure in Detail:          The patient was brought to room 8 at [facility], and placed supine on the operating table. The procedure was done under local anesthesia. The left upper anterior chest and neck were prepped and draped in a sterile fashion. Surgical time out was performed. 1% Xylocaine with epinephrine was used as local infiltration anesthetic. Transverse incision was made through the old scar. Dissection was carried down to the port and the capsule was incised freeing up the port. The Prolene sutures were cut and removed and the port and catheter removed intact. There was no bleeding.  Subcutaneous tissue was closed with 0 Vicryl sutures and the skin closed with a running subcuticular suture of 4-0 Monocryl and Dermabond. The patient tolerated the procedure well was taken back to the recovery area in excellent condition. EBL 5 cc. Counts correct. Complications none.     Edsel Petrin. Dalbert Batman, M.D., FACS General and Minimally Invasive Surgery Breast and Colorectal Surgery  12/10/2013 8:40 AM

## 2013-12-10 NOTE — Discharge Instructions (Signed)
You may shower, starting tomorrow. No tub baths for 2 weeks or so.  Avoid it contact sports and getting dirty or sweaty for about 2 or 3 weeks. Normal activities thereafter.  In terms of long-term surveillance, you have stated that you will followup with Dr. [name] every 6 months and will return to [facility] surgery if surgical issues arise. Let us know if we can help in the future.

## 2013-12-10 NOTE — Interval H&P Note (Signed)
History and Physical Interval Note:  12/10/2013 7:58 AM  Nicole Valentine  has presented today for surgery, with the diagnosis of un-needed port  The goals and the various methods of treatment have been discussed with the patient and family. After consideration of risks, benefits and other options for treatment, the patient has consented to  Procedure(s): MINOR REMOVAL PORT-A-CATH (N/A) as a surgical intervention .  The patient's history has been reviewed, patient examined today, no change in status, stable for surgery.  I have reviewed the patient's chart and labs.  Questions were answered to the patient's satisfaction.     [name]

## 2013-12-11 ENCOUNTER — Encounter (HOSPITAL_BASED_OUTPATIENT_CLINIC_OR_DEPARTMENT_OTHER): Payer: Self-pay | Admitting: General Surgery

## 2013-12-15 ENCOUNTER — Telehealth: Payer: Self-pay | Admitting: Oncology

## 2013-12-15 NOTE — Telephone Encounter (Signed)
Pt cld to adv she couldnt make 8/17 appt for labs-per Val pt could come 8/14. Pt understood time & date

## 2014-01-02 ENCOUNTER — Other Ambulatory Visit: Payer: BC Managed Care – PPO

## 2014-01-02 ENCOUNTER — Other Ambulatory Visit: Payer: Self-pay | Admitting: *Deleted

## 2014-01-02 ENCOUNTER — Ambulatory Visit (HOSPITAL_BASED_OUTPATIENT_CLINIC_OR_DEPARTMENT_OTHER): Payer: BC Managed Care – PPO

## 2014-01-02 DIAGNOSIS — C50419 Malignant neoplasm of upper-outer quadrant of unspecified female breast: Secondary | ICD-10-CM

## 2014-01-02 DIAGNOSIS — C50411 Malignant neoplasm of upper-outer quadrant of right female breast: Secondary | ICD-10-CM

## 2014-01-02 DIAGNOSIS — C773 Secondary and unspecified malignant neoplasm of axilla and upper limb lymph nodes: Secondary | ICD-10-CM

## 2014-01-02 LAB — CBC WITH DIFFERENTIAL/PLATELET
BASO%: 0.4 % (ref 0.0–2.0)
Basophils Absolute: 0 10*3/uL (ref 0.0–0.1)
EOS%: 1.1 % (ref 0.0–7.0)
Eosinophils Absolute: 0.1 10*3/uL (ref 0.0–0.5)
HCT: 39.8 % (ref 34.8–46.6)
HGB: 13.7 g/dL (ref 11.6–15.9)
LYMPH#: 1.4 10*3/uL (ref 0.9–3.3)
LYMPH%: 29.6 % (ref 14.0–49.7)
MCH: 35.2 pg — ABNORMAL HIGH (ref 25.1–34.0)
MCHC: 34.3 g/dL (ref 31.5–36.0)
MCV: 102.4 fL — ABNORMAL HIGH (ref 79.5–101.0)
MONO#: 0.3 10*3/uL (ref 0.1–0.9)
MONO%: 6.6 % (ref 0.0–14.0)
NEUT#: 2.9 10*3/uL (ref 1.5–6.5)
NEUT%: 62.3 % (ref 38.4–76.8)
Platelets: 153 10*3/uL (ref 145–400)
RBC: 3.89 10*6/uL (ref 3.70–5.45)
RDW: 12.5 % (ref 11.2–14.5)
WBC: 4.6 10*3/uL (ref 3.9–10.3)

## 2014-01-02 LAB — COMPREHENSIVE METABOLIC PANEL (CC13)
ALT: 11 U/L (ref 0–55)
AST: 18 U/L (ref 5–34)
Albumin: 3.7 g/dL (ref 3.5–5.0)
Alkaline Phosphatase: 110 U/L (ref 40–150)
Anion Gap: 8 mEq/L (ref 3–11)
BUN: 13.1 mg/dL (ref 7.0–26.0)
CALCIUM: 9.3 mg/dL (ref 8.4–10.4)
CHLORIDE: 105 meq/L (ref 98–109)
CO2: 29 mEq/L (ref 22–29)
Creatinine: 0.8 mg/dL (ref 0.6–1.1)
Glucose: 92 mg/dl (ref 70–140)
Potassium: 4 mEq/L (ref 3.5–5.1)
Sodium: 143 mEq/L (ref 136–145)
Total Bilirubin: 0.46 mg/dL (ref 0.20–1.20)
Total Protein: 6.9 g/dL (ref 6.4–8.3)

## 2014-01-05 ENCOUNTER — Other Ambulatory Visit: Payer: BC Managed Care – PPO

## 2014-01-12 ENCOUNTER — Ambulatory Visit (HOSPITAL_BASED_OUTPATIENT_CLINIC_OR_DEPARTMENT_OTHER): Payer: BC Managed Care – PPO | Admitting: Oncology

## 2014-01-12 ENCOUNTER — Telehealth: Payer: Self-pay | Admitting: Oncology

## 2014-01-12 VITALS — BP 143/71 | HR 72 | Temp 98.3°F | Resp 18 | Ht 67.0 in | Wt 184.0 lb

## 2014-01-12 DIAGNOSIS — C50411 Malignant neoplasm of upper-outer quadrant of right female breast: Secondary | ICD-10-CM

## 2014-01-12 DIAGNOSIS — C50419 Malignant neoplasm of upper-outer quadrant of unspecified female breast: Secondary | ICD-10-CM

## 2014-01-12 DIAGNOSIS — C773 Secondary and unspecified malignant neoplasm of axilla and upper limb lymph nodes: Secondary | ICD-10-CM

## 2014-01-12 DIAGNOSIS — M7989 Other specified soft tissue disorders: Secondary | ICD-10-CM

## 2014-01-12 DIAGNOSIS — Z171 Estrogen receptor negative status [ER-]: Secondary | ICD-10-CM

## 2014-01-12 NOTE — Progress Notes (Signed)
And a Patient ID: Nicole Valentine, female   DOB: 1965-07-10, 48 y.o.   MRN: [id number] ID: Nicole Valentine OB: 04-03-1966  MR#: [id number]  YQM#:[id number]  PCP: [name], MD GYN:   SU: [name]); [name] OTHER MD: [name], [name]  CHIEF COMPLAINT:  Hx of Right Breast Cancer (triple negative) CURRENT TREATMENT: Observation  HISTORY OF PRESENT ILLNESS: From Doctor Khan's intake nodes 11/10/2012:  "She originally palpated a right breast mass. She had a mammogram performed that showed dense breasts bilaterally. Ultrasound of the right breast showed 2.3 x 1.9 cm area of abnormality with multiple abnormal lymph nodes. MRI of the bilateral breasts showed asymmetrical enhancement throughout the right breast consistent with multicentric disease. An ultrasound-guided biopsy performed. The known area of disease measured 1.9 x 1.9 x 2.3 cm. Multiple abnormal positive right axillary lymph nodes were noted. 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 Biopsy of the right axillary lymph node was positive for malignancy with extracapsular extension. Patient was originally seen by Dr. [name] Dr. [name] and Dr. [name]. She has elected to have a right mastectomy eventually and declined biopsies of any other areas within the breast."  Her subsequent history is as detailed below.  INTERVAL HISTORY: Nicole Valentine returns today for followup of her triple negative right breast cancer. The interval history is unremarkable. She exercises chiefly by swimming. 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 Her 2 older sons are going to [employer]. Since her last visit here she had her port removed. That went uneventfully   REVIEW OF SYSTEMS: Saharra is noticed that her blood pressure goes up when she visits physician's. This is a recent phenomenon for her. They just returned from the beach in her right  arm swelled a little bit. She thinks it may be the increased activity or the heat. Last night she started using her sleeve again. She is also doing massage. She's had right knee laparoscopic surgery and that was complicated by some inflammation and then some diarrhea likely secondary to antibiotics, which she took very briefly. Otherwise she still is having some hot flashes and vaginal dryness, but those are the only menopausal symptoms she is experiencing. She denies insomnia, mood changes, or weight gain (though she is "fighting that"). She still has very intermittent pain over the lower breast bone. She has not been able to figure out a pattern. It is not clearly related to meals or abscess with meals. A detailed review of systems today was otherwise noncontributory  PAST MEDICAL HISTORY: Past Medical History  Diagnosis Date  . Complication of anesthesia     her father and uncle both had very difficult time waking up-was  something they told them may be hereditory. she will find out.  . Breast cancer dx'd 04-10-12-rt  . Hypothyroidism   . 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 Radiation 01/21/13-03/06/13    Right Breast    PAST SURGICAL HISTORY: Past Surgical History  Procedure Laterality Date  . Wisdom tooth extraction    . Portacath placement  04/22/2012    Procedure: INSERTION PORT-A-CATH;  Surgeon: [name], MD;  Location: [place];  Service: General;  Laterality: Left;  [name] Modified mastectomy Right 11/18/2012    Procedure:  RIGHT MODIFIED MASTECTOMY;  Surgeon: [name], MD;  Location: [place];  Service: General;  Laterality: Right;  . Knee arthroscopy Right  11/26/2013  . Port-a-cath removal Left 12/10/2013    Procedure: MINOR REMOVAL PORT-A-CATH;  Surgeon: [name], MD;  Location: [place];  Service: General;  Laterality: Left;    FAMILY HISTORY Family History  Problem Relation Age of Onset  . Lung cancer  Maternal Grandfather 66    smoker  . Prostate cancer Maternal Grandfather 90  . Throat cancer Other     Great Aunt x 2  . Liver cancer Other     Maternal Great Grandmother  . Melanoma Maternal Uncle 81  . Brain cancer Cousin 11    non-malignant  the patient's parents are living, in their early 52s. The patient has 2 brothers, no sisters. The patient's mother was diagnosed with breast cancer, HER-2 positive, in 2014 in [place].  GYNECOLOGIC HISTORY:   (Updated 10/03/2013) Menarche age 20, first live birth age 38. She is GX P4. LMP January 2014. 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  The children are currently ages 52, 7, 29, and 51. Her husband, Nicole Valentine, is a Customer service manager. They attend the [employer]: Not in place   HEALTH MAINTENANCE:  (Updated 10/03/2013) History  Substance Use Topics  . Smoking status: Never Smoker   . Smokeless tobacco: Never Used  . Alcohol Use: No     Colonoscopy: Never  PAP: November 2013/Dr. Smith  Bone density: January 2015, [facility], normal  Lipid panel:  Not on file  Allergies  Allergen Reactions  . Almond Meal Rash  . Cashew Nut Oil Rash  . Mango Flavor Rash  . Other Rash    LOTIONS    Current Outpatient Prescriptions  Medication Sig Dispense Refill  . levothyroxine (SYNTHROID, LEVOTHROID) 150 MCG tablet Take 150 mcg by mouth daily before breakfast.      . oxyCODONE-acetaminophen (PERCOCET/ROXICET) 5-325 MG per tablet Take 1 tablet by mouth every 4 (four) hours as needed for severe pain.       No current facility-administered medications for this visit.      Filed Vitals:   01/12/14 1439  BP: 143/71  Pulse: 72  Temp: 98.3 F (36.8 C)  Resp: 18     Body mass index is 28.81 kg/(m^2).    ECOG FS:0 - Asymptomatic Filed Weights   01/12/14 1439  Weight: 184 lb (83.462 kg)   Middle-aged white woman in no acute  distress Sclerae unicteric, pupils equal and reactive Oropharynx clear and moist-- teeth in good repair No cervical or supraclavicular adenopathy Lungs no rales or rhonchi Heart regular rate and rhythm Abd soft, nontender, positive bowel sounds MSK no focal spinal tenderness, grade 1 right upper extremity lymphedema, sleeve and glove in place Neuro: nonfocal, well oriented, positive affect Breasts: The right breast is status post lumpectomy and radiation. There is no evidence of disease recurrence. The right axilla is benign. The left breast is unremarkable.   LAB RESULTS:     Lab Results  Component Value Date   WBC 4.6 01/02/2014   NEUTROABS 2.9 01/02/2014   HGB 13.7 01/02/2014   HCT 39.8 01/02/2014   MCV 102.4* 01/02/2014   PLT 153 01/02/2014      Chemistry      Component Value Date/Time   NA 143 01/02/2014 1551   NA 142 11/15/2012 1215   K 4.0 01/02/2014 1551   K 3.7 11/15/2012 1215   CL 104 11/15/2012 1215  CL 101 10/18/2012 0859   CO2 29 01/02/2014 1551   CO2 30 11/15/2012 1215   BUN 13.1 01/02/2014 1551   BUN 8 11/15/2012 1215   CREATININE 0.8 01/02/2014 1551   CREATININE 0.67 11/15/2012 1215      Component Value Date/Time   CALCIUM 9.3 01/02/2014 1551   CALCIUM 9.3 11/15/2012 1215   ALKPHOS 110 01/02/2014 1551   AST 18 01/02/2014 1551   ALT 11 01/02/2014 1551   BILITOT 0.46 01/02/2014 1551       STUDIES:  A bone density at [facility] on 06/06/2013 was normal.  A left mammogram at [facility] on 06/06/2013 was unremarkable; density category C.     ASSESSMENT: 48 y.o. BRCA negative [place] woman  (1) an status post right breast upper outer quadrant and right axillary lymph node biopsy 04/04/2012, both positive for an invasive ductal carcinoma, grade 3, triple negative, with an MIB-1 of 25%  (2) Treated neoadjuvantly with  (a) fluorouracil, cyclophosphamide, and epirubicin (at 100 mg/M2) x4 completed 06/07/2012  (b) docetaxel (75 mg/M2) for one dose, 06/21/2012, poorly  tolerated  (c) carboplatin and gemcitabine given every 21 days for 6 cycles completed 10/11/2012  (3) status post right modified radical mastectomy 11/18/2012 showing a complete pathologic response--all 16 lymph nodes were benign  (4) adjuvant radiation therapy completed 03/06/2013  (5) no plans for reconstructon  PLAN:  Rheanna is doing fine as far as her breast cancer is concerned. We discussed going back to the lymphedema clinic but she has a good understanding of how to massage the right upper extremity and how to use her sleeve appropriately.  I still think the pain she gets substernal he sometimes is due to reflux. Next time she has that she will have sometimes available take 2 immediately and see if that relieves it. Otherwise it may be due to remote trauma over that area. It does not seem to be activity related and it is also not related to meals one way or the other.  We will continue to see her on an every 3 month basis until she is 2 years out from a right modified radical mastectomy, at which point we will start seeing her every 6 months. She has a good understanding of the overall plan. She agrees with it. She knows the goal of treatment in her cases cure. She will call with any problems that may develop before the next visit here.  Chauncey Cruel, MD   01/12/2014 2:53 PM

## 2014-01-12 NOTE — Telephone Encounter (Signed)
, °

## 2014-03-23 ENCOUNTER — Telehealth: Payer: Self-pay | Admitting: Oncology

## 2014-03-23 NOTE — Telephone Encounter (Signed)
pt called and left demographic info but no reason for call. returned call and lm asking pt to call me back at her convenience - I am returning her call.

## 2014-03-25 ENCOUNTER — Telehealth: Payer: Self-pay | Admitting: Oncology

## 2014-03-25 NOTE — Telephone Encounter (Signed)
, °

## 2014-03-30 ENCOUNTER — Other Ambulatory Visit (HOSPITAL_BASED_OUTPATIENT_CLINIC_OR_DEPARTMENT_OTHER): Payer: BC Managed Care – PPO

## 2014-03-30 ENCOUNTER — Other Ambulatory Visit: Payer: Self-pay | Admitting: Emergency Medicine

## 2014-03-30 DIAGNOSIS — C50411 Malignant neoplasm of upper-outer quadrant of right female breast: Secondary | ICD-10-CM

## 2014-03-30 DIAGNOSIS — C773 Secondary and unspecified malignant neoplasm of axilla and upper limb lymph nodes: Secondary | ICD-10-CM

## 2014-03-30 LAB — COMPREHENSIVE METABOLIC PANEL (CC13)
ALT: 21 U/L (ref 0–55)
AST: 21 U/L (ref 5–34)
Albumin: 3.7 g/dL (ref 3.5–5.0)
Alkaline Phosphatase: 124 U/L (ref 40–150)
Anion Gap: 8 mEq/L (ref 3–11)
BILIRUBIN TOTAL: 0.48 mg/dL (ref 0.20–1.20)
BUN: 13.4 mg/dL (ref 7.0–26.0)
CO2: 29 mEq/L (ref 22–29)
Calcium: 9 mg/dL (ref 8.4–10.4)
Chloride: 103 mEq/L (ref 98–109)
Creatinine: 0.7 mg/dL (ref 0.6–1.1)
Glucose: 102 mg/dl (ref 70–140)
Potassium: 3.5 mEq/L (ref 3.5–5.1)
Sodium: 141 mEq/L (ref 136–145)
Total Protein: 6.8 g/dL (ref 6.4–8.3)

## 2014-03-30 LAB — CBC WITH DIFFERENTIAL/PLATELET
BASO%: 0.3 % (ref 0.0–2.0)
BASOS ABS: 0 10*3/uL (ref 0.0–0.1)
EOS%: 1.2 % (ref 0.0–7.0)
Eosinophils Absolute: 0.1 10*3/uL (ref 0.0–0.5)
HCT: 41 % (ref 34.8–46.6)
HEMOGLOBIN: 13.8 g/dL (ref 11.6–15.9)
LYMPH%: 24.2 % (ref 14.0–49.7)
MCH: 34.5 pg — AB (ref 25.1–34.0)
MCHC: 33.5 g/dL (ref 31.5–36.0)
MCV: 102.9 fL — AB (ref 79.5–101.0)
MONO#: 0.2 10*3/uL (ref 0.1–0.9)
MONO%: 5 % (ref 0.0–14.0)
NEUT#: 3.4 10*3/uL (ref 1.5–6.5)
NEUT%: 69.3 % (ref 38.4–76.8)
Platelets: 157 10*3/uL (ref 145–400)
RBC: 3.99 10*6/uL (ref 3.70–5.45)
RDW: 12.9 % (ref 11.2–14.5)
WBC: 5 10*3/uL (ref 3.9–10.3)
lymph#: 1.2 10*3/uL (ref 0.9–3.3)

## 2014-04-06 ENCOUNTER — Encounter: Payer: Self-pay | Admitting: Nurse Practitioner

## 2014-04-06 ENCOUNTER — Ambulatory Visit (HOSPITAL_BASED_OUTPATIENT_CLINIC_OR_DEPARTMENT_OTHER): Payer: BC Managed Care – PPO | Admitting: Nurse Practitioner

## 2014-04-06 ENCOUNTER — Telehealth: Payer: Self-pay | Admitting: Oncology

## 2014-04-06 VITALS — BP 130/81 | HR 68 | Temp 97.8°F | Resp 18 | Ht 67.0 in | Wt 190.4 lb

## 2014-04-06 DIAGNOSIS — C773 Secondary and unspecified malignant neoplasm of axilla and upper limb lymph nodes: Secondary | ICD-10-CM

## 2014-04-06 DIAGNOSIS — Z171 Estrogen receptor negative status [ER-]: Secondary | ICD-10-CM

## 2014-04-06 DIAGNOSIS — C50411 Malignant neoplasm of upper-outer quadrant of right female breast: Secondary | ICD-10-CM

## 2014-04-06 NOTE — Progress Notes (Signed)
And a Patient ID: Nicole Valentine, female   DOB: Mar 11, 1966, 48 y.o.   MRN: [id number] ID: Nicole Valentine OB: 1966-02-18  MR#: [id number]  UMP#:[id number]  PCP: Nicole Naas, MD GYN:   SU: Nicole Valentine); Nicole Valentine OTHER MD: Nicole Valentine, Nicole Valentine  CHIEF COMPLAINT:  Hx of Right Breast Cancer (triple negative) CURRENT TREATMENT: Observation  BREAST CANCER HISTORY: From Doctor Khan's intake nodes 11/10/2012:  "She originally palpated a right breast mass. She had a mammogram performed that showed dense breasts bilaterally. Ultrasound of the right breast showed 2.3 x 1.9 cm area of abnormality with multiple abnormal lymph nodes. MRI of the bilateral breasts showed asymmetrical enhancement throughout the right breast consistent with multicentric disease. An ultrasound-guided biopsy performed. The known area of disease measured 1.9 x 1.9 x 2.3 cm. Multiple abnormal positive right axillary lymph nodes were noted. 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 Biopsy of the right axillary lymph node was positive for malignancy with extracapsular extension. Patient was originally seen by Dr. Margot Valentine Dr. Truddie Valentine and Dr. Pablo Valentine. She has elected to have a right mastectomy eventually and declined biopsies of any other areas within the breast."  Her subsequent history is as detailed below.  INTERVAL HISTORY: Nicole Valentine returns today for followup of her triple negative right breast cancer, accompanied by her husband. The interval history is generally unremarkable. The are no changes to make to her health history or family history. Everyone is doing weill and is healthy. She has questions today concerning scans and whether she is due for a repeat scan because of some "hot spots" noted under her right clavicle from a PET scan before treatment.    REVIEW OF SYSTEMS: Nicole Valentine denies fevers, chills, nausea, vomiting, or changes in  bowel or bladder habits. She has some hot flashes and vaginal dryness, but she is managing these on her own. She has no insomnia, mood changes, or weight gain. She denies shortness of breath, chest pain, cough, palpitations, or fatigue.  Her right arm lymphedema is still a problem. The sleeve is uncomfortable and does not seem to be effective. She prefers to utilize therapeutic massage to this areas twice a day. This helps, but the swelling returns in less than 12 hrs. A detailed review of systmes is otherwise noncontributory.   PAST MEDICAL HISTORY: Past Medical History  Diagnosis Date  . Complication of anesthesia     her father and uncle both had very difficult time waking up-was  something they told them may be hereditory. she will find out.  . Breast cancer dx'd 04-10-12-rt  . Hypothyroidism   . 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 Radiation 01/21/13-03/06/13    Right Breast    PAST SURGICAL HISTORY: Past Surgical History  Procedure Laterality Date  . Wisdom tooth extraction    . Portacath placement  04/22/2012    Procedure: INSERTION PORT-A-CATH;  Surgeon: Nicole Lasso, MD;  Location: [place];  Service: General;  Laterality: Left;  [name] Modified mastectomy Right 11/18/2012    Procedure:  RIGHT MODIFIED MASTECTOMY;  Surgeon: Nicole Lasso, MD;  Location: [place];  Service: General;  Laterality: Right;  . Knee arthroscopy Right 11/26/2013  . Port-a-cath removal Left 12/10/2013    Procedure: MINOR REMOVAL PORT-A-CATH;  Surgeon: Nicole Hector, MD;  Location: [place];  Service: General;  Laterality: Left;    FAMILY HISTORY Family History  Problem Relation Age of Onset  . Lung cancer Maternal Grandfather 42    smoker  . Prostate cancer Maternal Grandfather 90  . Throat cancer Other     Great Aunt x 2  . Liver cancer Other     Maternal Great Grandmother  . Melanoma Maternal Uncle 81  . Brain cancer Cousin 11     non-malignant  the patient's parents are living, in their early 49s. The patient has 2 brothers, no sisters. The patient's mother was diagnosed with breast cancer, HER-2 positive, in 2014 in [place].  GYNECOLOGIC HISTORY:   (Updated 10/03/2013) Menarche age 24, first live birth age 57. She is GX P4. LMP January 2014. 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  The children are currently ages 9, 36, 74, and 54. Her husband, Nicole Valentine, is a Customer service manager. They attend the [place]: Not in place   HEALTH MAINTENANCE:  (Updated 10/03/2013) History  Substance Use Topics  . Smoking status: Never Smoker   . Smokeless tobacco: Never Used  . Alcohol Use: No     Colonoscopy: Never  PAP: November 2013/Nicole Valentine  Bone density: January 2015, [facility], normal  Lipid panel:  Not on file  Allergies  Allergen Reactions  . Almond Meal Rash  . Cashew Nut Oil Rash  . Mango Flavor Rash  . Other Rash    LOTIONS    Current Outpatient Prescriptions  Medication Sig Dispense Refill  . levothyroxine (SYNTHROID, LEVOTHROID) 150 MCG tablet Take 150 mcg by mouth daily before breakfast.     No current facility-administered medications for this visit.       Filed Vitals:   04/06/14 1337  BP: 130/81  Pulse: 68  Temp: 97.8 F (36.6 C)  Resp: 18     Body mass index is 29.81 kg/(m^2).    ECOG FS:0 - Asymptomatic Filed Weights   04/06/14 1337  Weight: 190 lb 6.4 oz (86.365 kg)   Middle-aged white woman in no acute distress Skin: warm, dry  HEENT: sclerae anicteric, conjunctivae pink, oropharynx clear. No thrush or mucositis.  Lymph Nodes: No cervical or supraclavicular lymphadenopathy  Lungs: clear to auscultation bilaterally, no rales, wheezes, or rhonci  Heart: regular rate and rhythm  Abdomen: round, soft, non tender, positive bowel sounds  Musculoskeletal: No focal spinal  tenderness, right upper extremity lymphedema, grade 1 Neuro: non focal, well oriented, positive affect  Breasts: right breast status post mastectomy and radiation. No evidence of local recurrence. Right axilla benign. Left breast unremarkable.   LAB RESULTS:     Lab Results  Component Value Date   WBC 5.0 03/30/2014   NEUTROABS 3.4 03/30/2014   HGB 13.8 03/30/2014   HCT 41.0 03/30/2014   MCV 102.9* 03/30/2014   PLT 157 03/30/2014      Chemistry      Component Value Date/Time   NA 141 03/30/2014 1420   NA 142 11/15/2012 1215   K 3.5 03/30/2014 1420   K 3.7 11/15/2012 1215   CL 104 11/15/2012 1215   CL 101 10/18/2012 0859   CO2 29 03/30/2014 1420   CO2 30 11/15/2012 1215   BUN 13.4 03/30/2014 1420   BUN 8 11/15/2012 1215   CREATININE 0.7 03/30/2014 1420   CREATININE 0.67 11/15/2012 1215      Component Value Date/Time   CALCIUM 9.0 03/30/2014 1420   CALCIUM 9.3 11/15/2012  1215   ALKPHOS 124 03/30/2014 1420   AST 21 03/30/2014 1420   ALT 21 03/30/2014 1420   BILITOT 0.48 03/30/2014 1420       STUDIES:  A bone density at [facility] on 06/06/2013 was normal.  A left mammogram at [facility] on 06/06/2013 was unremarkable; density category C.   ASSESSMENT: 48 y.o. BRCA negative  woman  (1) an status post right breast upper outer quadrant and right axillary lymph node biopsy 04/04/2012, both positive for an invasive ductal carcinoma, grade 3, triple negative, with an MIB-1 of 25%  (2) Treated neoadjuvantly with  (a) fluorouracil, cyclophosphamide, and epirubicin (at 100 mg/M2) x4 completed 06/07/2012  (b) docetaxel (75 mg/M2) for one dose, 06/21/2012, poorly tolerated  (c) carboplatin and gemcitabine given every 21 days for 6 cycles completed 10/11/2012  (3) status post right modified radical mastectomy 11/18/2012 showing a complete pathologic response--all 16 lymph nodes were benign  (4) adjuvant radiation therapy completed 03/06/2013  (5) no plans for  reconstructon  PLAN: Satina is doing well today. The labs were reviewed in detail and were entirely stable. We spent some time discussing the "hot spots" found on her PET scan before chemotherapy last year. She understands that this area was likely taken care of by this systemic treatment, but she would feel better if she had a scan performed for "proof." She is switching jobs and will likely [employer], so she would like this performed before the end of the year while she is still covered.  I consulted with Dr. [name] and he was agreeable. I have put in orders for a chest CT to be performed before Dec. 31th.   Otherwise, Chalsea had no complaints, questions, or concerns. She will continue follow up visits every 3 months until she is 2 years out from her mastectomy. Her next mammogram is due in January. She understands and agrees with this plan. She knows the goal of treatment in her case is cure. She has been encouraged to call with any issues that might arise before her next visit here.  Marcelino Duster, NP   04/06/2014 2:30 PM

## 2014-04-06 NOTE — Telephone Encounter (Signed)
sch pt appt-gave pt copy of sch-adv pt CS will cal lto sch appt

## 2014-04-20 ENCOUNTER — Ambulatory Visit (HOSPITAL_COMMUNITY)
Admission: RE | Admit: 2014-04-20 | Discharge: 2014-04-20 | Disposition: A | Discharge status: Home / Self Care | Payer: BC Managed Care – PPO | Source: Ambulatory Visit | Attending: Nurse Practitioner | Admitting: Nurse Practitioner

## 2014-04-20 ENCOUNTER — Telehealth: Payer: Self-pay | Admitting: *Deleted

## 2014-04-20 ENCOUNTER — Encounter (HOSPITAL_COMMUNITY): Payer: Self-pay

## 2014-04-20 DIAGNOSIS — Z9221 Personal history of antineoplastic chemotherapy: Secondary | ICD-10-CM | POA: Diagnosis not present

## 2014-04-20 DIAGNOSIS — Z923 Personal history of irradiation: Secondary | ICD-10-CM | POA: Diagnosis not present

## 2014-04-20 DIAGNOSIS — C50411 Malignant neoplasm of upper-outer quadrant of right female breast: Secondary | ICD-10-CM | POA: Insufficient documentation

## 2014-04-20 MED ORDER — IOHEXOL 300 MG/ML  SOLN
80.0000 mL | Freq: Once | INTRAMUSCULAR | Status: AC | PRN
  Administered 2014-04-20: 80 mL via INTRAVENOUS

## 2014-04-20 NOTE — Telephone Encounter (Signed)
-----   Message from Marcelino Duster, NP sent at 04/20/2014 10:07 AM EST ----- Call patient. Let her know that CT was completely negative. Wish her a happy holidays from the office.

## 2014-04-20 NOTE — Telephone Encounter (Signed)
As noted below called to inform pt of results of CT. Communicated with the pt there was no evidence of recurrent or metastatic disease. Her CT was normal. Pt verbalized understanding and was pleased with the results. Message to be forwarded to [facility].

## 2014-04-22 ENCOUNTER — Telehealth: Payer: Self-pay | Admitting: *Deleted

## 2014-04-22 ENCOUNTER — Other Ambulatory Visit: Payer: Self-pay | Admitting: *Deleted

## 2014-04-22 NOTE — Telephone Encounter (Signed)
Per call from Amy with [facility] order for DME for lymphedema garments not received post prior request.  This RN obtained new request , completed upon receiving and returned fax to [facility].  Obtained printed confirmation.

## 2014-05-02 ENCOUNTER — Telehealth: Payer: Self-pay | Admitting: Oncology

## 2014-05-02 NOTE — Telephone Encounter (Signed)
lvm for pt regarding to 2.24 appt moved to 2.25 due to [facility].....mailed pt appt sched and letter

## 2014-07-08 ENCOUNTER — Other Ambulatory Visit (HOSPITAL_BASED_OUTPATIENT_CLINIC_OR_DEPARTMENT_OTHER): Payer: BLUE CROSS/BLUE SHIELD

## 2014-07-08 DIAGNOSIS — C50411 Malignant neoplasm of upper-outer quadrant of right female breast: Secondary | ICD-10-CM

## 2014-07-08 DIAGNOSIS — C773 Secondary and unspecified malignant neoplasm of axilla and upper limb lymph nodes: Secondary | ICD-10-CM

## 2014-07-08 LAB — CBC WITH DIFFERENTIAL/PLATELET
BASO%: 0.8 % (ref 0.0–2.0)
Basophils Absolute: 0 10*3/uL (ref 0.0–0.1)
EOS%: 1.6 % (ref 0.0–7.0)
Eosinophils Absolute: 0.1 10*3/uL (ref 0.0–0.5)
HEMATOCRIT: 44.5 % (ref 34.8–46.6)
HEMOGLOBIN: 14.9 g/dL (ref 11.6–15.9)
LYMPH#: 1.3 10*3/uL (ref 0.9–3.3)
LYMPH%: 26.8 % (ref 14.0–49.7)
MCH: 33.9 pg (ref 25.1–34.0)
MCHC: 33.4 g/dL (ref 31.5–36.0)
MCV: 101.6 fL — ABNORMAL HIGH (ref 79.5–101.0)
MONO#: 0.2 10*3/uL (ref 0.1–0.9)
MONO%: 4.5 % (ref 0.0–14.0)
NEUT#: 3.2 10*3/uL (ref 1.5–6.5)
NEUT%: 66.3 % (ref 38.4–76.8)
PLATELETS: 157 10*3/uL (ref 145–400)
RBC: 4.38 10*6/uL (ref 3.70–5.45)
RDW: 12.8 % (ref 11.2–14.5)
WBC: 4.8 10*3/uL (ref 3.9–10.3)

## 2014-07-08 LAB — COMPREHENSIVE METABOLIC PANEL (CC13)
ALBUMIN: 3.9 g/dL (ref 3.5–5.0)
ALT: 23 U/L (ref 0–55)
ANION GAP: 10 meq/L (ref 3–11)
AST: 31 U/L (ref 5–34)
Alkaline Phosphatase: 105 U/L (ref 40–150)
BUN: 14.6 mg/dL (ref 7.0–26.0)
CALCIUM: 9.4 mg/dL (ref 8.4–10.4)
CHLORIDE: 105 meq/L (ref 98–109)
CO2: 26 meq/L (ref 22–29)
Creatinine: 0.8 mg/dL (ref 0.6–1.1)
EGFR: >90 mL/min/{1.73_m2} (ref >90)
Glucose: 87 mg/dl (ref 70–140)
Potassium: 4.1 mEq/L (ref 3.5–5.1)
SODIUM: 142 meq/L (ref 136–145)
TOTAL PROTEIN: 7.2 g/dL (ref 6.4–8.3)
Total Bilirubin: 0.6 mg/dL (ref 0.20–1.20)

## 2014-07-15 ENCOUNTER — Ambulatory Visit: Payer: BC Managed Care – PPO | Admitting: Oncology

## 2014-07-16 ENCOUNTER — Ambulatory Visit (HOSPITAL_BASED_OUTPATIENT_CLINIC_OR_DEPARTMENT_OTHER): Payer: BLUE CROSS/BLUE SHIELD | Admitting: Oncology

## 2014-07-16 ENCOUNTER — Telehealth: Payer: Self-pay | Admitting: Oncology

## 2014-07-16 VITALS — BP 119/79 | HR 64 | Temp 98.3°F | Resp 18 | Ht 67.0 in | Wt 189.7 lb

## 2014-07-16 DIAGNOSIS — C50411 Malignant neoplasm of upper-outer quadrant of right female breast: Secondary | ICD-10-CM

## 2014-07-16 DIAGNOSIS — Z853 Personal history of malignant neoplasm of breast: Secondary | ICD-10-CM

## 2014-07-16 DIAGNOSIS — I890 Lymphedema, not elsewhere classified: Secondary | ICD-10-CM

## 2014-07-16 NOTE — Progress Notes (Signed)
And a Patient ID: Nicole Valentine, female   DOB: 04-Jun-1965, 49 y.o.   MRN: [id number] ID: Nicole Valentine OB: 05-29-1965  MR#: [id number]  IQN#:[id number]  PCP: [name], MD GYN:   SU: Cicero Duck); Almond Lint OTHER MD: Lurline Hare, Jodi Geralds  CHIEF COMPLAINT:  Hx of Right Breast Cancer (triple negative) CURRENT TREATMENT: Observation  BREAST CANCER HISTORY: From Doctor Khan's intake nodes 11/10/2012:  "She originally palpated a right breast mass. She had a mammogram performed that showed dense breasts bilaterally. Ultrasound of the right breast showed 2.3 x 1.9 cm area of abnormality with multiple abnormal lymph nodes. MRI of the bilateral breasts showed asymmetrical enhancement throughout the right breast consistent with multicentric disease. An ultrasound-guided biopsy performed. The known area of disease measured 1.9 x 1.9 x 2.3 cm. Multiple abnormal positive right axillary lymph nodes were noted. 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 Biopsy of the right axillary lymph node was positive for malignancy with extracapsular extension. Patient was originally seen by Dr. Jamey Ripa Dr. Donnie Coffin and Dr. Michell Heinrich. She has elected to have a right mastectomy eventually and declined biopsies of any other areas within the breast."  Her subsequent history is as detailed below.  INTERVAL HISTORY: [name] returns today for followup of her triple negative right breast cancer. The interval history is generally stable. She had some arthroscopic knee surgery last summer and that knee is better. She is now having more problems with her left knee. It doesn't bother her when she walks and she walks 20-40 minutes 3 times a week. 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 He will currently looking for another  position. That might mean that they may have to leave [place] at some point.  REVIEW OF SYSTEMS: Nicole Valentine has occasional pain in the lower sternal area. This is clearly chest wall and is not reflux. It has been present for at least 7 or 8 years, antedating her chest surgery. She is not aware of any trauma to that area. She also has some back pain, which also is intermittent. It tends to be on the sharp side and relatively brief. She has had some constipation, and probably as a result some bright red blood per to him. This is being evaluated by Dr. Katrinka Blazing her primary care physician and it does sound like a colonoscopy may be in [name]'s near future. She has had a little bit of a sore throat lately, with very minimal sinus symptoms. She does have mild heartburn symptoms. Otherwise a detailed review of systems today was entirely stable  PAST MEDICAL HISTORY: Past Medical History  Diagnosis Date  . Complication of anesthesia     her father and uncle both had very difficult time waking up-was  something they told them may be hereditory. she will find out.  [name] Hypothyroidism   . 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 Radiation 01/21/13-03/06/13    Right Breast  . Breast cancer dx'd 04-10-12-rt    PAST SURGICAL HISTORY: Past Surgical History  Procedure Laterality Date  . Wisdom tooth extraction    . Portacath placement  04/22/2012    Procedure: INSERTION PORT-A-CATH;  Surgeon: Currie Paris, MD;  Location: [facility];  Service: General;  Laterality: Left;  [name] Modified mastectomy Right 11/18/2012  Procedure:  RIGHT MODIFIED MASTECTOMY;  Surgeon: [name], MD;  Location: [place];  Service: General;  Laterality: Right;  . Knee arthroscopy Right 11/26/2013  . Port-a-cath removal Left 12/10/2013    Procedure: MINOR REMOVAL PORT-A-CATH;  Surgeon: [name], MD;  Location: [place];  Service: General;  Laterality: Left;    FAMILY  HISTORY Family History  Problem Relation Age of Onset  . Lung cancer Maternal Grandfather 49    smoker  . Prostate cancer Maternal Grandfather 90  . Throat cancer Other     Great Aunt x 2  . Liver cancer Other     Maternal Great Grandmother  . Melanoma Maternal Uncle 81  . Brain cancer Cousin 11    non-malignant  the patient's parents are living, in their early 42s. The patient has 2 brothers, no sisters. The patient's mother was diagnosed with breast cancer, HER-2 positive, in 2014 in [place].  GYNECOLOGIC HISTORY:   (Updated 10/03/2013) Menarche age 55, first live birth age 41. She is GX P4. LMP January 2014. 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  The children are currently ages 51, 110, 23, and 48. Her husband, [name], is a Customer service manager. They attend the [place]: Not in place   HEALTH MAINTENANCE:  (Updated 10/03/2013) History  Substance Use Topics  . Smoking status: Never Smoker   . Smokeless tobacco: Never Used  . Alcohol Use: No     Colonoscopy: Never  PAP: November 2013/Dr. Smith  Bone density: January 2015, [facility], normal  Lipid panel:  Not on file  Allergies  Allergen Reactions  . Almond Meal Rash  . Cashew Nut Oil Rash  . Mango Flavor Rash  . Other Rash    LOTIONS    Current Outpatient Prescriptions  Medication Sig Dispense Refill  . levothyroxine (SYNTHROID, LEVOTHROID) 150 MCG tablet Take 150 mcg by mouth daily before breakfast.     No current facility-administered medications for this visit.       Filed Vitals:   07/16/14 1121  BP: 119/79  Pulse: 64  Temp: 98.3 F (36.8 C)  Resp: 18     Body mass index is 29.7 kg/(m^2).    ECOG FS:1 - Symptomatic but completely ambulatory Filed Weights   07/16/14 1121  Weight: 189 lb 11.2 oz (86.047 kg)   Sclerae unicteric, pupils equal and reactive Oropharynx clear, teeth in  good repair No cervical or supraclavicular adenopathy Lungs no rales or rhonchi Heart regular rate and rhythm Abd soft, nontender, positive bowel sounds MSK no focal spinal tenderness, no tenderness over the body of the sternum or the xiphoid process, and no evidence of costochondritis by palpation; grade 1 right upper extremity lymphedema, with compression sleeve in place Neuro: nonfocal, well oriented, appropriate affect Breasts: The right breast is status post mastectomy and radiation. There is no evidence of chest wall recurrence. The right axilla is benign. The left breast is unremarkable.  LAB RESULTS:     Lab Results  Component Value Date   WBC 4.8 07/08/2014   NEUTROABS 3.2 07/08/2014   HGB 14.9 07/08/2014   HCT 44.5 07/08/2014   MCV 101.6* 07/08/2014   PLT 157 07/08/2014      Chemistry      Component Value Date/Time   NA 142 07/08/2014 1205   NA 142 11/15/2012 1215  K 4.1 07/08/2014 1205   K 3.7 11/15/2012 1215   CL 104 11/15/2012 1215   CL 101 10/18/2012 0859   CO2 26 07/08/2014 1205   CO2 30 11/15/2012 1215   BUN 14.6 07/08/2014 1205   BUN 8 11/15/2012 1215   CREATININE 0.8 07/08/2014 1205   CREATININE 0.67 11/15/2012 1215      Component Value Date/Time   CALCIUM 9.4 07/08/2014 1205   CALCIUM 9.3 11/15/2012 1215   ALKPHOS 105 07/08/2014 1205   AST 31 07/08/2014 1205   ALT 23 07/08/2014 1205   BILITOT 0.60 07/08/2014 1205       STUDIES:  A bone density at [facility] on 06/06/2013 was normal.  A left mammogram at [facility] on 07/03/2014 was unremarkable; density category C.   ASSESSMENT: 49 y.o. BRCA negative [place] woman  (1) an status post right breast upper outer quadrant and right axillary lymph node biopsy 04/04/2012, both positive for an invasive ductal carcinoma, grade 3, triple negative, with an MIB-1 of 25%  (2) Treated neoadjuvantly with  (a) fluorouracil, cyclophosphamide, and epirubicin (at 100 mg/M2) x4 completed 06/07/2012  (b)  docetaxel (75 mg/M2) for one dose, 06/21/2012, poorly tolerated  (c) carboplatin and gemcitabine given every 21 days for 6 cycles completed 10/11/2012  (3) status post right modified radical mastectomy 11/18/2012 showing a complete pathologic response--all 16 lymph nodes were benign  (4) adjuvant radiation therapy completed 03/06/2013  (5) no plans for reconstructon  PLAN: Marki is now a little over 2 years out from her definitive surgery with no evidence of disease recurrence. This is very favorable. She understands that the first 2 years the most dangerous once, particularly for triple negative breast cancer cases like hers. At this point I am comfortable seeing her on an every 6 month basis.  She is distressed that she will not be able to have an appointment with Dr. Tamala Julian until September. I reassured her that she just had a normal physical exam here and that her Pap smear can wait as it has always been normal in the past. It does sound like she will be heading for a colonoscopy soon since Dr. Tamala Julian has documented positive guaiacs.  The right upper extremity lymphedema is currently grade 1 but I would hate to see it progress. She is doing daily the size and wearing her sleeve. I think he would be a good idea for physical therapy to take another look and see if there is anything else to do, perhaps obtaining one of the "pumps" that she might where overnight if she wishes.  I did review the sternal images on her CT of the chest from November and I see absolutely no sternal abnormality. By palpation today the sternum was entirely normal. Possibly she has a little bit of costochondritis or a small amount of arthritis at the xiphoid junction.  Torianne has a good understanding of the overall plan. She agrees with it. She will call with any problems that may develop before her next visit here. Chauncey Cruel, MD   07/16/2014 11:45 AM

## 2014-07-16 NOTE — Telephone Encounter (Signed)
per pof to sch pt appt-gave pt copy of sch-pt mamma already done last month

## 2014-07-20 ENCOUNTER — Telehealth: Payer: Self-pay | Admitting: *Deleted

## 2014-07-20 NOTE — Telephone Encounter (Signed)
Message: "left side next to pelvic bone is very sore."  Called patient [phone])  Reports pain is similar to sensation she experienced in 1991 with a cyst on her ovary.  Wanted to let Dr. [name] know.  Sharp pain two inches down into left mid pelvic bone area with ocassional back pain, then at times it's barely noticeable.  The last time it went away within four days.  I've taken ibuprofen.  No fever, chills, N/V, redness or warmth.  The knee just hurts.  The ibuprofen does not touch the knee.  I'll watch it and call back to let him know status."  Call back number [phone].

## 2014-07-22 NOTE — Telephone Encounter (Signed)
Called pt to inquire if paion persists , left message-- will do [place] if not resolved

## 2014-07-23 NOTE — Telephone Encounter (Signed)
Awaiting return call from patient.  Provider called patient to see if pain persists.

## 2014-08-11 ENCOUNTER — Ambulatory Visit: Payer: BLUE CROSS/BLUE SHIELD | Attending: Family Medicine | Admitting: Physical Therapy

## 2014-08-11 DIAGNOSIS — I972 Postmastectomy lymphedema syndrome: Secondary | ICD-10-CM

## 2014-08-11 NOTE — Therapy (Signed)
[place], [place], [address] Phone: [phone]   Fax:  [phone]  Physical Therapy Evaluation  Patient Details  Name: Nicole Valentine MRN: [id number] Date of Birth: 05/31/1965 Referring Provider:  Chauncey Cruel, MD  Encounter Date: 08/11/2014      PT End of Session - 08/11/14 1245    Visit Number 1   Number of Visits 9   Date for PT Re-Evaluation 09/29/14  pt is delaying start of treament due to out of town   PT Start Time 1015   PT Stop Time 1105   PT Time Calculation (min) 50 min   Activity Tolerance Patient tolerated treatment well   Behavior During Therapy [facility] for tasks assessed/performed      Past Medical History  Diagnosis Date  . Complication of anesthesia     her father and uncle both had very difficult time waking up-was  something they told them may be hereditory. she will find out.  [name] Hypothyroidism   . 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 Radiation 01/21/13-03/06/13    Right Breast  . Breast cancer dx'd 04-10-12-rt    Past Surgical History  Procedure Laterality Date  . Wisdom tooth extraction    . Portacath placement  04/22/2012    Procedure: INSERTION PORT-A-CATH;  Surgeon: [name], MD;  Location: [place];  Service: General;  Laterality: Left;  [name] Modified mastectomy Right 11/18/2012    Procedure:  RIGHT MODIFIED MASTECTOMY;  Surgeon: [name], MD;  Location: [place];  Service: General;  Laterality: Right;  . Knee arthroscopy Right 11/26/2013  . Port-a-cath removal Left 12/10/2013    Procedure: MINOR REMOVAL PORT-A-CATH;  Surgeon: [name], MD;  Location: [place];  Service: General;  Laterality: Left;    There were no vitals filed for this visit.  Visit Diagnosis:  Lymphedema syndrome, postmastectomy      Subjective Assessment - 08/11/14 1024    Symptoms persistent lymphedema in right arm  since last August despite consistent daily constant use of flatknit compression seeve and glove and Reid sleeve nighttime garment.   Patient Stated Goals  get right arm to look like left arm as much as possilbe.    Currently in Pain? No/denies            [facility] PT Assessment - 08/11/14 0001    Assessment   Medical Diagnosis breast cancer   Balance Screen   Has the patient fallen in the past 6 months No   Has the patient had a decrease in activity level because of a fear of falling?  No   Is the patient reluctant to leave their home because of a fear of falling?  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    Skin Integrity no open areas obseved    Quick DASH  0   Sensation  Light Touch Appears Intact   Coordination   Gross Motor Movements are Fluid and Coordinated Yes   Posture/Postural Control   Posture/Postural Control No significant limitations   AROM   Overall AROM Comments shoulder range of motion is within functional limits with tightness in extremes.    Strength   Overall Strength Within functional limits for tasks performed  generalized deconditioning   Palpation   Palpation well healed mastectomy scar with some lymphostatic fibrosis  above scar line, decreased mobility of central scar and also later drain site. Alos firm fibrosis in axilla      10% tissue extensitiblity at upper lateral  chest at radiation fibrosis sight            LYMPHEDEMA/ONCOLOGY QUESTIONNAIRE - 08/11/14 1028    Type   Cancer Type breast cancer    Surgeries   Mastectomy Date 10/20/12  drains in for 10 weeks, 3 different aspirations.    Axillary Lymph Node Dissection Date 10/20/12   Number Lymph Nodes Removed 14   Date Lymphedema/Swelling Started   Date 02/19/13  flare up when on vacation August 2015    Treatment   Past Chemotherapy Treatment Yes   Date 09/20/11   Past Radiation Treatment Yes   Date 01/20/13   What other symptoms do you have   Are you Having Heaviness or Tightness Yes   Are you having Pain No   Are you having pitting edema Yes   Body Site forearm.elboe, upper arm    Is it Hard or Difficult finding clothes that fit Yes   Do you have infections No   Right Upper Extremity Lymphedema   At Axilla  36.4 cm   15 cm Proximal to Olecranon Process 35.9 cm   10 cm Proximal to Olecranon Process 35 cm   Olecranon Process 29.6 cm   15 cm Proximal to Ulnar Styloid Process 30.3 cm   10 cm Proximal to Ulnar Styloid Process 27 cm   Just Proximal to Ulnar Styloid Process 17.9 cm   Across Hand at [employer] 20.9 cm   At [place] of 2nd Digit 6.9 cm   Left Upper Extremity Lymphedema   At Axilla  35.2 cm   15 cm Proximal to Olecranon Process 33.1 cm   10 cm Proximal to Olecranon Process 31 cm   Olecranon Process 27 cm   15 cm Proximal to Ulnar Styloid Process 27.7 cm   10 cm Proximal to Ulnar Styloid Process 24.5 cm   Just Proximal to Ulnar Styloid Process 16.5 cm   Across Hand at [employer] 20.4 cm   At [place] of 2nd Digit 6.6 cm   Other arounn chest at axilla and at widest part 96   Other diagonal from around opposite neck to  right axilla87 to lefyt axilla  80.4            Quick Dash - 08/11/14 0001    Open a tight or new jar No difficulty   Do heavy household chores (wash walls, wash floors) No difficulty   Carry a shopping bag or briefcase No difficulty   Wash  your back No difficulty   Use a knife to cut food No difficulty   Recreational activities in which you take some force or impact through your arm, shoulder, or hand (golf, hammering, tennis) No difficulty   During the past week, to what extent has your arm, shoulder or hand problem interfered with your normal social activities with family, friends, neighbors,  or groups? Not at all   During the past week, to what extent has your arm, shoulder or hand problem limited your work or other regular daily activities Not at all   Arm, shoulder, or hand pain. None   Tingling (pins and needles) in your arm, shoulder, or hand None   Difficulty Sleeping No difficulty   DASH Score 0 %                        Short Term Clinic Goals - 08/11/14 1300    CC Short Term Goal  #1   Title short term goals = long term goals             Long Term Clinic Goals - 08/11/14 1306    CC Long Term Goal  #4   Title decrease circumferece of right arm at 10 cm proximal to the olecranon to  26 cm              Plan - 08/11/14 1246    Clinical Impression Statement radiation fibrosis in right upper lateral chest with lymphostatic fibrosis in right anterior axilla, back and upper extremtity.    Pt will benefit from skilled therapeutic intervention in order to improve on the following deficits Decreased knowledge of use of DME;Decreased activity tolerance;Increased edema;Increased fascial restricitons;Decreased scar mobility   Rehab Potential Excellent   Clinical Impairments Affecting Rehab Potential 14 lymph nodes removed.  longstanding lymphedema with fibrosis, previous surgery and radiation to upper quadrant   PT Frequency 2x / week   PT Duration 4 weeks   PT Treatment/Interventions Therapeutic exercise;Scar mobilization;DME Instruction;Patient/family education;Manual techniques;Manual lymph drainage;Other (comment)  DME possible intermittent compression pump. other: kinesiotape, instrument  assisted soft tissue mobilitzation   PT Next Visit Plan scar mobilizaion, myofascial release to right upper lateral chest., manual lymph drainage focusing on chest. Assess if patient is doing self manual lymph drainage, teach self mayofascial release techniques to chest. consider kinesiotape to  scarred areas   Consulted and Agree with Plan of Care Patient         Problem List Patient Active Problem List   Diagnosis Date Noted  . Lymphedema of arm 10/03/2013  . Knee pain, right 10/03/2013  . GERD (gastroesophageal reflux disease) 10/03/2013  . History of breast cancer 10/03/2013  . Breast cancer of upper-outer quadrant of right female breast 01/28/2013  . 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 Owens Shark, PT  08/11/2014, 1:08 PM  [place], [place], [address] Phone: [phone]   Fax:  [phone]

## 2014-08-24 ENCOUNTER — Ambulatory Visit: Payer: BLUE CROSS/BLUE SHIELD | Attending: Family Medicine

## 2014-08-24 DIAGNOSIS — I972 Postmastectomy lymphedema syndrome: Secondary | ICD-10-CM | POA: Diagnosis present

## 2014-08-24 NOTE — Therapy (Signed)
[place], [place], [address] Phone: [phone]   Fax:  [phone]  Physical Therapy Treatment  Patient Details  Name: Nicole Valentine MRN: [id number] Date of Birth: 1965-12-22 Referring Provider:  Carol Ada, MD  Encounter Date: 08/24/2014      PT End of Session - 08/24/14 1155    Visit Number 2   Number of Visits 9   Date for PT Re-Evaluation 09/29/14   PT Start Time 1110   PT Stop Time 1150   PT Time Calculation (min) 40 min      Past Medical History  Diagnosis Date  . Complication of anesthesia     her father and uncle both had very difficult time waking up-was  something they told them may be hereditory. she will find out.  [name] Hypothyroidism   . 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 Radiation 01/21/13-03/06/13    Right Breast  . Breast cancer dx'd 04-10-12-rt    Past Surgical History  Procedure Laterality Date  . Wisdom tooth extraction    . Portacath placement  04/22/2012    Procedure: INSERTION PORT-A-CATH;  Surgeon: [name], MD;  Location: [place];  Service: General;  Laterality: Left;  [name] Modified mastectomy Right 11/18/2012    Procedure:  RIGHT MODIFIED MASTECTOMY;  Surgeon: [name], MD;  Location: [place];  Service: General;  Laterality: Right;  . Knee arthroscopy Right 11/26/2013  . Port-a-cath removal Left 12/10/2013    Procedure: MINOR REMOVAL PORT-A-CATH;  Surgeon: [name], MD;  Location: [place];  Service: General;  Laterality: Left;    There were no vitals filed for this visit.  Visit Diagnosis:  Lymphedema syndrome, postmastectomy      Subjective Assessment - 08/24/14 1154    Subjective Ready to get the swelling out of my arm.   Currently in Pain? No/denies                       [facility] Adult PT Treatment/Exercise - 08/24/14 0001    Manual Therapy   Manual Lymphatic  Drainage (MLD) In Supine: Short neck, Lt axilla and Rt Inguinal nodes, anterior inter-axillary and Rt axillo-inguinal anastomosis, and Rt UE from fingers/dorsal hand to lateral shoulder.   Compression Bandaging Biotone lotion applied, stockinette, Elastomull to all 5 fingers, Artiflex x1, and 1-6, 1-10 and 1-12 cm short stretch compression bandages from hand to axilla.                    Short Term Clinic Goals - 08/11/14 1300    CC Short Term Goal  #1   Title short term goals = long term goals             Long Term Clinic Goals - 08/11/14 1306    CC Long Term Goal  #4   Title decrease circumferece of right arm at 10 cm proximal to the olecranon to  26 cm              Plan - 08/24/14 1157    Clinical Impression Statement Pt tolerated first treatment of complete decongestive therapy well. She was educated on remedial exercises to perform with bandaging if any pain or tingling symptoms occur, and to remove bandages if these exercises didnt alleviate symptoms. Pt is willing to keep bandages on between appointments as long as she has no symptoms and they dont slide down  her arm at which point she was instructed to remove them and bring them to next appointment. Pt verbalized understanding all of this.    Pt will benefit from skilled therapeutic intervention in order to improve on the following deficits Decreased knowledge of use of DME;Decreased activity tolerance;Increased edema;Increased fascial restricitons;Decreased scar mobility   Rehab Potential Excellent   Clinical Impairments Affecting Rehab Potential 14 lymph nodes removed.  longstanding lymphedema with fibrosis, previous surgery and radiation to upper quadrant   PT Frequency 2x / week   PT Duration 4 weeks   PT Next Visit Plan Continue complete decongestive therapy working towards goal of decrease circumferential measurements.   Consulted and Agree with Plan of Care Patient        Problem List Patient Active  Problem List   Diagnosis Date Noted  . Lymphedema of arm 10/03/2013  . Knee pain, right 10/03/2013  . GERD (gastroesophageal reflux disease) 10/03/2013  . History of breast cancer 10/03/2013  . Breast cancer of upper-outer quadrant of right female breast 01/28/2013  . 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

## 2014-08-25 NOTE — Addendum Note (Signed)
Addended by: Kipp Laurence on: 08/25/2014 01:16 PM   Modules accepted: Orders

## 2014-08-26 ENCOUNTER — Ambulatory Visit: Payer: BLUE CROSS/BLUE SHIELD

## 2014-08-26 DIAGNOSIS — I972 Postmastectomy lymphedema syndrome: Secondary | ICD-10-CM | POA: Diagnosis not present

## 2014-08-26 NOTE — Therapy (Signed)
[place], [place], [address] Phone: [phone]   Fax:  [phone]  Physical Therapy Treatment  Patient Details  Name: Nicole Valentine MRN: [id number] Date of Birth: 10-11-1965 Referring Provider:  Carol Ada, MD  Encounter Date: 08/26/2014      PT End of Session - 08/26/14 1208    Visit Number 3   Number of Visits 9   Date for PT Re-Evaluation 09/29/14   PT Start Time 1111   PT Stop Time 1202   PT Time Calculation (min) 51 min      Past Medical History  Diagnosis Date  . Complication of anesthesia     her father and uncle both had very difficult time waking up-was  something they told them may be hereditory. she will find out.  [name] Hypothyroidism   . 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 Radiation 01/21/13-03/06/13    Right Breast  . Breast cancer dx'd 04-10-12-rt    Past Surgical History  Procedure Laterality Date  . Wisdom tooth extraction    . Portacath placement  04/22/2012    Procedure: INSERTION PORT-A-CATH;  Surgeon: [name], MD;  Location: [place];  Service: General;  Laterality: Left;  [name] Modified mastectomy Right 11/18/2012    Procedure:  RIGHT MODIFIED MASTECTOMY;  Surgeon: [name], MD;  Location: [place];  Service: General;  Laterality: Right;  . Knee arthroscopy Right 11/26/2013  . Port-a-cath removal Left 12/10/2013    Procedure: MINOR REMOVAL PORT-A-CATH;  Surgeon: [name], MD;  Location: [place];  Service: General;  Laterality: Left;    There were no vitals filed for this visit.  Visit Diagnosis:  Lymphedema syndrome, postmastectomy      Subjective Assessment - 08/26/14 1115    Subjective Bandages slipped down so took them off this morning. Wore an old sleeve I had when I showered this morning. No problems with bandages when they were on.    Currently in Pain? No/denies                LYMPHEDEMA/ONCOLOGY QUESTIONNAIRE - 08/26/14 1116    Right Upper Extremity Lymphedema   At Axilla  35.7 cm   15 cm Proximal to Olecranon Process 33.8 cm   10 cm Proximal to Olecranon Process 32.4 cm   Olecranon Process 29 cm   15 cm Proximal to Ulnar Styloid Process 28.8 cm   10 cm Proximal to Ulnar Styloid Process 26.3 cm   Just Proximal to Ulnar Styloid Process 17 cm   Across Hand at [employer] 19.6 cm   At [place] of 2nd Digit 6.6 cm                [facility] Adult PT Treatment/Exercise - 08/26/14 0001    Manual Therapy   Edema Management Kinesiotape to Rt axillo-inguinal anastomosis with 3 finger fan shape   Manual Lymphatic Drainage (MLD) In Supine: Short neck, Lt axilla and Rt Inguinal nodes, superficial and deep abdominals, anterior inter-axillary and Rt axillo-inguinal anastomosis, and Rt UE from fingers/dorsal hand to lateral shoulder, and Lt S/L for posterior inter-axillary and Rt axillo-inguinal anastomosis.    Compression Bandaging Biotone lotion applied, stockinette, Elastomull to all 5 fingers, Artiflex x1 with 1/4" gray foam piece at anterior elbow, and 1-6, 1-10 and 1-12 cm short stretch compression bandages from hand to axilla.  Short Term Clinic Goals - 08/11/14 1300    CC Short Term Goal  #1   Title short term goals = long term goals             Long Term Clinic Goals - 08/26/14 1212    CC Long Term Goal  #2   Title 50% tissue extensibility at right upper lateral chest radiation fibrosis site   Status On-going   CC Long Term Goal  #3   Title pt will report that she knows how to care for and manage her lymphedem at home independently    Status On-going   CC Long Term Goal  #4   Title decrease circumferece of right arm at 10 cm proximal to the olecranon to  26 cm    Decreased to 32.4 cm today from 35 cm.   Status On-going            Plan - 08/26/14 1208    Clinical Impression Statement Pt had good reductions  already after bandaged only one time. Added kinesiotape to treatment today, see elsewhere in note. Pt will let us know if this seemed to help her truncal edema at all.    Pt will benefit from skilled therapeutic intervention in order to improve on the following deficits Decreased knowledge of use of DME;Decreased activity tolerance;Increased edema;Increased fascial restricitons;Decreased scar mobility   Rehab Potential Excellent   Clinical Impairments Affecting Rehab Potential 14 lymph nodes removed.  longstanding lymphedema with fibrosis, previous surgery and radiation to upper quadrant   PT Frequency 2x / week   PT Duration 4 weeks   PT Treatment/Interventions Therapeutic exercise;Scar mobilization;DME Instruction;Patient/family education;Manual techniques;Manual lymph drainage;Other (comment);Compression bandaging   PT Next Visit Plan Continue complete dcongestive therapy, assess kinesiotape.    Consulted and Agree with Plan of Care Patient        Problem List Patient Active Problem List   Diagnosis Date Noted  . Lymphedema of arm 10/03/2013  . Knee pain, right 10/03/2013  . GERD (gastroesophageal reflux disease) 10/03/2013  . History of breast cancer 10/03/2013  . Breast cancer of upper-outer quadrant of right female breast 01/28/2013  . 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

## 2014-08-28 ENCOUNTER — Ambulatory Visit: Payer: BLUE CROSS/BLUE SHIELD | Admitting: Physical Therapy

## 2014-08-28 DIAGNOSIS — I972 Postmastectomy lymphedema syndrome: Secondary | ICD-10-CM

## 2014-08-28 NOTE — Therapy (Signed)
[place], [place], [address] Phone: [phone]   Fax:  [phone]  Physical Therapy Treatment  Patient Details  Name: Nicole Valentine MRN: [id number] Date of Birth: 03/25/1966 Referring Provider:  Carol Ada, MD  Encounter Date: 08/28/2014      PT End of Session - 08/28/14 1308    Visit Number 4   Number of Visits 9   Date for PT Re-Evaluation 09/29/14   PT Start Time 1151   PT Stop Time 1235   PT Time Calculation (min) 44 min   Activity Tolerance Patient tolerated treatment well   Behavior During Therapy [facility] for tasks assessed/performed      Past Medical History  Diagnosis Date  . Complication of anesthesia     her father and uncle both had very difficult time waking up-was  something they told them may be hereditory. she will find out.  [name] Hypothyroidism   . 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 Radiation 01/21/13-03/06/13    Right Breast  . Breast cancer dx'd 04-10-12-rt    Past Surgical History  Procedure Laterality Date  . Wisdom tooth extraction    . Portacath placement  04/22/2012    Procedure: INSERTION PORT-A-CATH;  Surgeon: [name], MD;  Location: [place];  Service: General;  Laterality: Left;  [name] Modified mastectomy Right 11/18/2012    Procedure:  RIGHT MODIFIED MASTECTOMY;  Surgeon: [name], MD;  Location: [place];  Service: General;  Laterality: Right;  . Knee arthroscopy Right 11/26/2013  . Port-a-cath removal Left 12/10/2013    Procedure: MINOR REMOVAL PORT-A-CATH;  Surgeon: [name], MD;  Location: [place];  Service: General;  Laterality: Left;    There were no vitals filed for this visit.  Visit Diagnosis:  Lymphedema syndrome, postmastectomy      Subjective Assessment - 08/28/14 1154    Subjective nothing new; wore an old sleeve that was smaller--the arm looks so much smaller; couldn't  tell if the kinesiotape worked   Currently in Pain? No/denies                       [facility] Adult PT Treatment/Exercise - 08/28/14 0001    Manual Therapy   Manual Lymphatic Drainage (MLD) In Supine: Short neck, Lt axilla and Rt Inguinal nodes, superficial and deep abdominals, anterior inter-axillary and Rt axillo-inguinal anastomosis, and Rt UE from fingers/dorsal hand to lateral shoulder, and Lt S/L for posterior inter-axillary and Rt axillo-inguinal anastomosis.    Compression Bandaging lotion applied, stockinette, Elastomull to all 5 fingers, Artiflex x1 with 1/4" gray foam piece at anterior elbow, and 1-6, 1-10 and 1-12 cm short stretch compression bandages from hand to axilla.                    Short Term Clinic Goals - 08/11/14 1300    CC Short Term Goal  #1   Title short term goals = long term goals             Long Term Clinic Goals - 08/26/14 1212    CC Long Term Goal  #2   Title 50% tissue extensibility at right upper lateral chest radiation fibrosis site   Status On-going   CC Long Term Goal  #3   Title pt will report that she knows how to care for and manage her lymphedem at home independently  Status On-going   CC Long Term Goal  #4   Title decrease circumferece of right arm at 10 cm proximal to the olecranon to  26 cm    Decreased to 32.4 cm today from 35 cm.   Status On-going            Plan - 08/28/14 1309    Clinical Impression Statement Pt. seems very pleased with her reductions so far.  Asked about making an appointment with fitter; I suggested waiting until next measurement mid next week, but pt. may go ahead and schedule the appointment for week after next.   Pt will benefit from skilled therapeutic intervention in order to improve on the following deficits Decreased knowledge of use of DME;Decreased activity tolerance;Increased edema;Increased fascial restricitons;Decreased scar mobility   PT Next Visit Plan Continue complete  decongestive therapy; remeasure Monday or Wednesday.   Recommended Other Services fitting for new garments perhaps week after next   Consulted and Agree with Plan of Care Patient        Problem List Patient Active Problem List   Diagnosis Date Noted  . Lymphedema of arm 10/03/2013  . Knee pain, right 10/03/2013  . GERD (gastroesophageal reflux disease) 10/03/2013  . History of breast cancer 10/03/2013  . Breast cancer of upper-outer quadrant of right female breast 01/28/2013  . 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

## 2014-08-31 ENCOUNTER — Ambulatory Visit: Payer: BLUE CROSS/BLUE SHIELD | Admitting: Physical Therapy

## 2014-08-31 DIAGNOSIS — I972 Postmastectomy lymphedema syndrome: Secondary | ICD-10-CM | POA: Diagnosis not present

## 2014-08-31 DIAGNOSIS — M25611 Stiffness of right shoulder, not elsewhere classified: Secondary | ICD-10-CM

## 2014-09-01 NOTE — Therapy (Signed)
[place], [place], [address] Phone: [phone]   Fax:  [phone]  Physical Therapy Treatment  Patient Details  Name: Nicole Valentine MRN: [id number] Date of Birth: 1965/10/24 Referring Provider:  Carol Ada, MD  Encounter Date: 08/31/2014      PT End of Session - 09/01/14 0952    Visit Number 5   Number of Visits 9   Date for PT Re-Evaluation 09/29/14   PT Start Time 1301   PT Stop Time 1350   PT Time Calculation (min) 49 min   Activity Tolerance Other (comment)  see skin integrity assessment   Behavior During Therapy [facility] for tasks assessed/performed      Past Medical History  Diagnosis Date  . Complication of anesthesia     her father and uncle both had very difficult time waking up-was  something they told them may be hereditory. she will find out.  [name] Hypothyroidism   . 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 Radiation 01/21/13-03/06/13    Right Breast  . Breast cancer dx'd 04-10-12-rt    Past Surgical History  Procedure Laterality Date  . Wisdom tooth extraction    . Portacath placement  04/22/2012    Procedure: INSERTION PORT-A-CATH;  Surgeon: [name], MD;  Location: [place];  Service: General;  Laterality: Left;  [name] Modified mastectomy Right 11/18/2012    Procedure:  RIGHT MODIFIED MASTECTOMY;  Surgeon: [name], MD;  Location: [place];  Service: General;  Laterality: Right;  . Knee arthroscopy Right 11/26/2013  . Port-a-cath removal Left 12/10/2013    Procedure: MINOR REMOVAL PORT-A-CATH;  Surgeon: [name], MD;  Location: [place];  Service: General;  Laterality: Left;    There were no vitals filed for this visit.  Visit Diagnosis:  Lymphedema syndrome, postmastectomy  Stiffness of joint, shoulder region, right      Subjective Assessment - 08/31/14 1303    Subjective Nothing new.  Kept bandages on  this time.   Currently in Pain? No/denies            [facility] PT Assessment - 09/01/14 0001    Observation/Other Assessments   Skin Integrity redness at right anterior elbow and forearm with some small bumps, which looks like irritation from bandages and is itchy           LYMPHEDEMA/ONCOLOGY QUESTIONNAIRE - 09/01/14 0952    Right Upper Extremity Lymphedema   At Axilla  35 cm   15 cm Proximal to Olecranon Process 34.1 cm   10 cm Proximal to Olecranon Process 32.6 cm   Olecranon Process 28.1 cm   15 cm Proximal to Ulnar Styloid Process 28.2 cm   10 cm Proximal to Ulnar Styloid Process 25.4 cm   Just Proximal to Ulnar Styloid Process 18 cm   Across Hand at [employer] 20 cm   At [place] of 2nd Digit 6.4 cm                [facility] Adult PT Treatment/Exercise - 09/01/14 0001    Manual Therapy   Manual Therapy Myofascial release   Edema Management circumference measurements taken   Myofascial Release soft tissue mobilization at scar on right chest; myofascial release crosshands technique as tolerated for right chest in vertical and diagonal directions   Manual Lymphatic Drainage (MLD) In Supine: Short neck, Lt axilla and Rt Inguinal nodes, superficial and deep abdominals, anterior inter-axillary  and Rt axillo-inguinal anastomosis, and Rt UE from fingers/dorsal hand to lateral shoulder, and Lt S/L for posterior inter-axillary and Rt axillo-inguinal anastomosis.    Compression Bandaging lotion applied, stockinette, Elastomull to all 5 fingers, Artiflex x1 with 1/4" gray foam piece in stockinette at anterior elbow, and 1-6, 1-10 and 1-12 cm short stretch compression bandages from hand to axilla.                    Short Term Clinic Goals - 08/11/14 1300    CC Short Term Goal  #1   Title short term goals = long term goals             Long Term Clinic Goals - 08/26/14 1212    CC Long Term Goal  #2   Title 50% tissue extensibility at right upper lateral chest  radiation fibrosis site   Status On-going   CC Long Term Goal  #3   Title pt will report that she knows how to care for and manage her lymphedem at home independently    Status On-going   CC Long Term Goal  #4   Title decrease circumferece of right arm at 10 cm proximal to the olecranon to  26 cm    Decreased to 32.4 cm today from 35 cm.   Status On-going            Plan - 09/01/14 0762    Clinical Impression Statement Measurements today show a few slight ups and downs.  Pt. will make appointment with fitter.  Worked to get more compression on wrist today.   Pt will benefit from skilled therapeutic intervention in order to improve on the following deficits Increased fascial restricitons;Increased edema;Decreased knowledge of use of DME;Decreased scar mobility   Rehab Potential Excellent   PT Frequency 3x / week   PT Duration 4 weeks   PT Treatment/Interventions Scar mobilization;Manual techniques;Manual lymph drainage;Compression bandaging   PT Next Visit Plan continue myofascial release/soft tissue mobilization; continue complete decongestive therapy   PT Home Exercise Plan advised patient to remove bandages before next session to give skin a little break since it was slightly irritated from bandages today   Consulted and Agree with Plan of Care Patient        Problem List Patient Active Problem List   Diagnosis Date Noted  . Lymphedema of arm 10/03/2013  . Knee pain, right 10/03/2013  . GERD (gastroesophageal reflux disease) 10/03/2013  . History of breast cancer 10/03/2013  . Breast cancer of upper-outer quadrant of right female breast 01/28/2013  . 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

## 2014-09-02 ENCOUNTER — Ambulatory Visit: Payer: BLUE CROSS/BLUE SHIELD

## 2014-09-02 DIAGNOSIS — I972 Postmastectomy lymphedema syndrome: Secondary | ICD-10-CM | POA: Diagnosis not present

## 2014-09-02 DIAGNOSIS — M25611 Stiffness of right shoulder, not elsewhere classified: Secondary | ICD-10-CM

## 2014-09-02 NOTE — Therapy (Signed)
[place], [place], [address] Phone: [phone]   Fax:  [phone]  Physical Therapy Treatment  Patient Details  Name: Nicole Valentine MRN: [id number] Date of Birth: January 31, 1966 Referring Provider:  Carol Ada, MD  Encounter Date: 09/02/2014      PT End of Session - 09/02/14 1154    Visit Number 6   Number of Visits 9   Date for PT Re-Evaluation 09/29/14   PT Start Time 1105   PT Stop Time 1146   PT Time Calculation (min) 41 min      Past Medical History  Diagnosis Date  . Complication of anesthesia     her father and uncle both had very difficult time waking up-was  something they told them may be hereditory. she will find out.  [name] Hypothyroidism   . 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 Radiation 01/21/13-03/06/13    Right Breast  . Breast cancer dx'd 04-10-12-rt    Past Surgical History  Procedure Laterality Date  . Wisdom tooth extraction    . Portacath placement  04/22/2012    Procedure: INSERTION PORT-A-CATH;  Surgeon: [name], MD;  Location: [place];  Service: General;  Laterality: Left;  [name] Modified mastectomy Right 11/18/2012    Procedure:  RIGHT MODIFIED MASTECTOMY;  Surgeon: [name], MD;  Location: [place];  Service: General;  Laterality: Right;  . Knee arthroscopy Right 11/26/2013  . Port-a-cath removal Left 12/10/2013    Procedure: MINOR REMOVAL PORT-A-CATH;  Surgeon: [name], MD;  Location: [place];  Service: General;  Laterality: Left;    There were no vitals filed for this visit.  Visit Diagnosis:  Lymphedema syndrome, postmastectomy  Stiffness of joint, shoulder region, right      Subjective Assessment - 09/02/14 1109    Subjective Took bandages off at 0930 and arm is red at elbow and inner wrist.    Currently in Pain? No/denies               LYMPHEDEMA/ONCOLOGY QUESTIONNAIRE -  09/01/14 0952    Right Upper Extremity Lymphedema   At Axilla  35 cm   15 cm Proximal to Olecranon Process 34.1 cm   10 cm Proximal to Olecranon Process 32.6 cm   Olecranon Process 28.1 cm   15 cm Proximal to Ulnar Styloid Process 28.2 cm   10 cm Proximal to Ulnar Styloid Process 25.4 cm   Just Proximal to Ulnar Styloid Process 18 cm   Across Hand at [employer] 20 cm   At [place] of 2nd Digit 6.4 cm                [facility] Adult PT Treatment/Exercise - 09/02/14 0001    Manual Therapy   Manual Lymphatic Drainage (MLD) In Supine: Short neck, Lt axilla and Rt Inguinal nodes, anterior inter-axillary and Rt axillo-inguinal anastomosis, and Rt UE from fingers/dorsal hand to lateral shoulder.    Compression Bandaging lotion applied, thick stockinette, Elastomull to all 5 fingers, Artiflex x1 with 1/4" gray foam piece in stockinette at anterior elbow, and 1-6, 1-10 and 1-12 cm short stretch compression bandages from hand to axilla.                    Short Term Clinic Goals - 08/11/14 1300    CC Short Term Goal  #1   Title short term goals = long term goals  [facility] Goals - 08/26/14 1212    CC Long Term Goal  #2   Title 50% tissue extensibility at right upper lateral chest radiation fibrosis site   Status On-going   CC Long Term Goal  #3   Title pt will report that she knows how to care for and manage her lymphedem at home independently    Status On-going   CC Long Term Goal  #4   Title decrease circumferece of right arm at 10 cm proximal to the olecranon to  26 cm    Decreased to 32.4 cm today from 35 cm.   Status On-going            Plan - 09/02/14 1155    Clinical Impression Statement Pt has called fitter and is awaiting hearing back from her. Had some moderate redness at Rt anterior elbow and medial wrist, used thick stockinette today to give pt increased cushion allowing skin to heal while still being wrapped, which she wanted to  continue.    Pt will benefit from skilled therapeutic intervention in order to improve on the following deficits Increased fascial restricitons;Increased edema;Decreased knowledge of use of DME;Decreased scar mobility   Rehab Potential Excellent   Clinical Impairments Affecting Rehab Potential 14 lymph nodes removed.  longstanding lymphedema with fibrosis, previous surgery and radiation to upper quadrant   PT Frequency 3x / week   PT Duration 4 weeks   PT Treatment/Interventions Scar mobilization;Manual techniques;Manual lymph drainage;Compression bandaging   PT Next Visit Plan continue myofascial release/soft tissue mobilization; continue complete decongestive therapy   Consulted and Agree with Plan of Care Patient        Problem List Patient Active Problem List   Diagnosis Date Noted  . Lymphedema of arm 10/03/2013  . Knee pain, right 10/03/2013  . GERD (gastroesophageal reflux disease) 10/03/2013  . History of breast cancer 10/03/2013  . Breast cancer of upper-outer quadrant of right female breast 01/28/2013  . 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

## 2014-09-07 ENCOUNTER — Ambulatory Visit: Payer: BLUE CROSS/BLUE SHIELD

## 2014-09-07 DIAGNOSIS — I972 Postmastectomy lymphedema syndrome: Secondary | ICD-10-CM

## 2014-09-07 DIAGNOSIS — M25611 Stiffness of right shoulder, not elsewhere classified: Secondary | ICD-10-CM

## 2014-09-07 NOTE — Therapy (Signed)
[place], [place], [address] Phone: [phone]   Fax:  [phone]  Physical Therapy Treatment  Patient Details  Name: Nicole Valentine MRN: [id number] Date of Birth: June 13, 1965 Referring Provider:  Carol Ada, MD  Encounter Date: 09/07/2014      PT End of Session - 09/07/14 1147    Visit Number 7   Number of Visits 9   Date for PT Re-Evaluation 09/29/14   PT Start Time 1103   PT Stop Time 1144   PT Time Calculation (min) 41 min      Past Medical History  Diagnosis Date  . Complication of anesthesia     her father and uncle both had very difficult time waking up-was  something they told them may be hereditory. she will find out.  [name] Hypothyroidism   . 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 Radiation 01/21/13-03/06/13    Right Breast  . Breast cancer dx'd 04-10-12-rt    Past Surgical History  Procedure Laterality Date  . Wisdom tooth extraction    . Portacath placement  04/22/2012    Procedure: INSERTION PORT-A-CATH;  Surgeon: [name], MD;  Location: [place];  Service: General;  Laterality: Left;  [name] Modified mastectomy Right 11/18/2012    Procedure:  RIGHT MODIFIED MASTECTOMY;  Surgeon: [name], MD;  Location: [place];  Service: General;  Laterality: Right;  . Knee arthroscopy Right 11/26/2013  . Port-a-cath removal Left 12/10/2013    Procedure: MINOR REMOVAL PORT-A-CATH;  Surgeon: [name], MD;  Location: [place];  Service: General;  Laterality: Left;    There were no vitals filed for this visit.  Visit Diagnosis:  Lymphedema syndrome, postmastectomy  Stiffness of joint, shoulder region, right      Subjective Assessment - 09/07/14 1104    Subjective Rewrapped arm on Saturday, but it slipped down a little.               LYMPHEDEMA/ONCOLOGY QUESTIONNAIRE - 09/07/14 1110    Right Upper Extremity  Lymphedema   At Axilla  34.7 cm   15 cm Proximal to Olecranon Process 33.5 cm   10 cm Proximal to Olecranon Process 32.5 cm   Olecranon Process 28.8 cm   15 cm Proximal to Ulnar Styloid Process 29.2 cm   10 cm Proximal to Ulnar Styloid Process 26.4 cm   Just Proximal to Ulnar Styloid Process 17.1 cm   Across Hand at [employer] 19.6 cm   At [place] of 2nd Digit 6.7 cm                  [facility] Adult PT Treatment/Exercise - 09/07/14 0001    Manual Therapy   Manual Lymphatic Drainage (MLD) In Supine: Short neck, Lt axilla and Rt Inguinal nodes, superficial and deep abdominals, anterior inter-axillary and Rt axillo-inguinal anastomosis, and Rt UE from fingers/dorsal hand to lateral shoulder.    Compression Bandaging Biotone lotion applied, thick stockinette, Elastomull to all 5 fingers, Artiflex x1 with 1/4" gray foam piece in stockinette at anterior elbow, and 1-6, 1-10 and 1-12 cm short stretch compression bandages from hand to axilla.                    Short Term Clinic Goals - 08/11/14 1300    CC Short Term Goal  #1   Title short term goals = long term goals  [facility] Goals - 08/26/14 1212    CC Long Term Goal  #2   Title 50% tissue extensibility at right upper lateral chest radiation fibrosis site   Status On-going   CC Long Term Goal  #3   Title pt will report that she knows how to care for and manage her lymphedem at home independently    Status On-going   CC Long Term Goal  #4   Title decrease circumferece of right arm at 10 cm proximal to the olecranon to  26 cm    Decreased to 32.4 cm today from 35 cm.   Status On-going            Plan - 09/07/14 1148    Clinical Impression Statement Pt has appointment with fitter and demo for pump on Wednesday this week before her appointment with [place], all will be done at this clinic. Her circumference measurements were a little increased from last weeks but overall still less than initial  visit.    Pt will benefit from skilled therapeutic intervention in order to improve on the following deficits Increased fascial restricitons;Increased edema;Decreased knowledge of use of DME;Decreased scar mobility   Rehab Potential Excellent   Clinical Impairments Affecting Rehab Potential 14 lymph nodes removed.  longstanding lymphedema with fibrosis, previous surgery and radiation to upper quadrant   PT Frequency 3x / week   PT Duration 4 weeks   PT Treatment/Interventions Scar mobilization;Manual techniques;Manual lymph drainage;Compression bandaging   PT Next Visit Plan continue myofascial release/soft tissue mobilization; continue complete decongestive therapy   Recommended Other Services Getting measured for new compression garments and having a demo of the pump this Wednesday.   Consulted and Agree with Plan of Care Patient        Problem List Patient Active Problem List   Diagnosis Date Noted  . Lymphedema of arm 10/03/2013  . Knee pain, right 10/03/2013  . GERD (gastroesophageal reflux disease) 10/03/2013  . History of breast cancer 10/03/2013  . Breast cancer of upper-outer quadrant of right female breast 01/28/2013  . 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

## 2014-09-09 ENCOUNTER — Ambulatory Visit: Payer: BLUE CROSS/BLUE SHIELD

## 2014-09-09 DIAGNOSIS — M25611 Stiffness of right shoulder, not elsewhere classified: Secondary | ICD-10-CM

## 2014-09-09 DIAGNOSIS — I972 Postmastectomy lymphedema syndrome: Secondary | ICD-10-CM

## 2014-09-09 NOTE — Therapy (Signed)
[place], [place], [address] Phone: [phone]   Fax:  [phone]  Physical Therapy Treatment  Patient Details  Name: Nicole Valentine MRN: [id number] Date of Birth: 25-Mar-1966 Referring Provider:  Carol Ada, MD  Encounter Date: 09/09/2014      PT End of Session - 09/09/14 1209    Visit Number 8   Number of Visits 9   Date for PT Re-Evaluation 09/29/14   PT Start Time 6712   PT Stop Time 1202   PT Time Calculation (min) 17 min      Past Medical History  Diagnosis Date  . Complication of anesthesia     her father and uncle both had very difficult time waking up-was  something they told them may be hereditory. she will find out.  [name] Hypothyroidism   . 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 Radiation 01/21/13-03/06/13    Right Breast  . Breast cancer dx'd 04-10-12-rt    Past Surgical History  Procedure Laterality Date  . Wisdom tooth extraction    . Portacath placement  04/22/2012    Procedure: INSERTION PORT-A-CATH;  Surgeon: [name], MD;  Location: [place];  Service: General;  Laterality: Left;  [name] Modified mastectomy Right 11/18/2012    Procedure:  RIGHT MODIFIED MASTECTOMY;  Surgeon: [name], MD;  Location: [place];  Service: General;  Laterality: Right;  . Knee arthroscopy Right 11/26/2013  . Port-a-cath removal Left 12/10/2013    Procedure: MINOR REMOVAL PORT-A-CATH;  Surgeon: [name], MD;  Location: [place];  Service: General;  Laterality: Left;    There were no vitals filed for this visit.  Visit Diagnosis:  Lymphedema syndrome, postmastectomy  Stiffness of joint, shoulder region, right      Subjective Assessment - 09/09/14 1124    Subjective Measured for new compression garments and got Flexitouch demonstration at this clinic today.    Currently in Pain? No/denies                          [facility] Adult PT Treatment/Exercise - 09/09/14 0001    Manual Therapy   Compression Bandaging Thick stockinette, Elastomull to all 5 fingers, Artiflex x1 with 1/4" gray foam piece in stockinette at anterior elbow, and 1-6, 1-10 and 1-12 cm short stretch compression bandages from hand to axilla.                    Short Term Clinic Goals - 08/11/14 1300    CC Short Term Goal  #1   Title short term goals = long term goals             Long Term Clinic Goals - 08/26/14 1212    CC Long Term Goal  #2   Title 50% tissue extensibility at right upper lateral chest radiation fibrosis site   Status On-going   CC Long Term Goal  #3   Title pt will report that she knows how to care for and manage her lymphedem at home independently    Status On-going   CC Long Term Goal  #4   Title decrease circumferece of right arm at 10 cm proximal to the olecranon to  26 cm    Decreased to 32.4 cm today from 35 cm.   Status On-going            Plan - 09/09/14 1210  Clinical Impression Statement Schedules got mixed up today with pt being measured for circumference garment and having Flexitouch demonstration both running late,  so only had time to rewrap pts arm.    Pt will benefit from skilled therapeutic intervention in order to improve on the following deficits Increased fascial restricitons;Increased edema;Decreased knowledge of use of DME;Decreased scar mobility   Rehab Potential Excellent   Clinical Impairments Affecting Rehab Potential 14 lymph nodes removed.  longstanding lymphedema with fibrosis, previous surgery and radiation to upper quadrant   PT Frequency 3x / week   PT Duration 4 weeks   PT Treatment/Interventions Scar mobilization;Manual techniques;Manual lymph drainage;Compression bandaging   PT Next Visit Plan Remeasure next visit; continue myofascial release/soft tissue mobilization; continue complete decongestive therapy    Consulted and Agree with Plan of Care Patient        Problem List Patient Active Problem List   Diagnosis Date Noted  . Lymphedema of arm 10/03/2013  . Knee pain, right 10/03/2013  . GERD (gastroesophageal reflux disease) 10/03/2013  . History of breast cancer 10/03/2013  . Breast cancer of upper-outer quadrant of right female breast 01/28/2013  . 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

## 2014-09-14 ENCOUNTER — Ambulatory Visit: Payer: BLUE CROSS/BLUE SHIELD | Admitting: Physical Therapy

## 2014-09-14 DIAGNOSIS — I972 Postmastectomy lymphedema syndrome: Secondary | ICD-10-CM | POA: Diagnosis not present

## 2014-09-14 NOTE — Therapy (Signed)
[place], [place], [address] Phone: [phone]   Fax:  [phone]  Physical Therapy Treatment  Patient Details  Name: Nicole Valentine MRN: [id number] Date of Birth: June 28, 1965 Referring Provider:  Carol Ada, MD  Encounter Date: 09/14/2014      PT End of Session - 09/14/14 1236    Visit Number 9   Number of Visits 18   Date for PT Re-Evaluation 10/05/14   PT Start Time 1109   PT Stop Time 1202   PT Time Calculation (min) 53 min   Activity Tolerance Patient tolerated treatment well   Behavior During Therapy [facility] for tasks assessed/performed      Past Medical History  Diagnosis Date  . Complication of anesthesia     her father and uncle both had very difficult time waking up-was  something they told them may be hereditory. she will find out.  [name] Hypothyroidism   . 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 Radiation 01/21/13-03/06/13    Right Breast  . Breast cancer dx'd 04-10-12-rt    Past Surgical History  Procedure Laterality Date  . Wisdom tooth extraction    . Portacath placement  04/22/2012    Procedure: INSERTION PORT-A-CATH;  Surgeon: [name], MD;  Location: [place];  Service: General;  Laterality: Left;  [name] Modified mastectomy Right 11/18/2012    Procedure:  RIGHT MODIFIED MASTECTOMY;  Surgeon: [name], MD;  Location: [place];  Service: General;  Laterality: Right;  . Knee arthroscopy Right 11/26/2013  . Port-a-cath removal Left 12/10/2013    Procedure: MINOR REMOVAL PORT-A-CATH;  Surgeon: [name], MD;  Location: [place];  Service: General;  Laterality: Left;    There were no vitals filed for this visit.  Visit Diagnosis:  Lymphedema syndrome, postmastectomy - Plan: PT plan of care cert/re-cert      Subjective Assessment - 09/14/14 1110    Subjective Not sure what I think about the pump.  Just took  bandages off this morning, except finger gauze came off Saturday.   Currently in Pain? No/denies      We did spend a few minutes today discussing pros and cons of getting a Flexitouch pump.  Patient is still trying to make up her mind about this.         LYMPHEDEMA/ONCOLOGY QUESTIONNAIRE - 09/14/14 1120    Right Upper Extremity Lymphedema   At Axilla  34.9 cm   15 cm Proximal to Olecranon Process 33.5 cm   10 cm Proximal to Olecranon Process 32.8 cm   Olecranon Process 28.4 cm   15 cm Proximal to Ulnar Styloid Process 28.6 cm   10 cm Proximal to Ulnar Styloid Process 26.5 cm   Just Proximal to Ulnar Styloid Process 17.2 cm   Across Hand at [employer] 19.7 cm   At [place] of 2nd Digit 6.8 cm                  [facility] Adult PT Treatment/Exercise - 09/14/14 0001    Manual Therapy   Edema Management circumference measurements taken   Manual Lymphatic Drainage (MLD) In Supine: Short neck, Lt axilla and Rt Inguinal nodes, superficial and deep abdominals, anterior inter-axillary and Rt axillo-inguinal anastomosis, and Rt UE from fingers/dorsal hand to lateral shoulder.    Compression Bandaging Lotion applied. Thick stockinette, Elastomull to all 5 fingers, Artiflex x1 with 1/4" gray foam piece in  stockinette at anterior elbow, and 1-6, 1-10 and 1-12 cm short stretch compression bandages from hand to axilla.                    Short Term Clinic Goals - 08/11/14 1300    CC Short Term Goal  #1   Title short term goals = long term goals             Long Term Clinic Goals - 09/14/14 1239    CC Long Term Goal  #1   Status On-going   CC Long Term Goal  #2   Status On-going   CC Long Term Goal  #3   Status On-going   CC Long Term Goal  #4   Baseline (This may be an incorrect goal, based on realistic measurements for this place on arm.  D.Tamma Brigandi, PT 09/14/14)            Plan - 09/14/14 1237    Clinical Impression Statement Patient's circumferences  measurements differ by just 0.1 cm. or so at most levels of right arm today.  Pt. unsure about whether to pursue Flexitouch.   Pt will benefit from skilled therapeutic intervention in order to improve on the following deficits Increased fascial restricitons;Increased edema;Decreased knowledge of use of DME;Decreased scar mobility   Rehab Potential Excellent   Clinical Impairments Affecting Rehab Potential 14 lymph nodes removed.  longstanding lymphedema with fibrosis, previous surgery and radiation to upper quadrant   PT Frequency 3x / week   PT Duration 3 weeks   PT Treatment/Interventions Manual techniques;Manual lymph drainage;Compression bandaging   PT Next Visit Plan continue myofascial release and complete decongestive therapy   Consulted and Agree with Plan of Care Patient        Problem List Patient Active Problem List   Diagnosis Date Noted  . Lymphedema of arm 10/03/2013  . Knee pain, right 10/03/2013  . GERD (gastroesophageal reflux disease) 10/03/2013  . History of breast cancer 10/03/2013  . Breast cancer of upper-outer quadrant of right female breast 01/28/2013  . 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

## 2014-09-16 ENCOUNTER — Ambulatory Visit: Payer: BLUE CROSS/BLUE SHIELD

## 2014-09-16 DIAGNOSIS — M25611 Stiffness of right shoulder, not elsewhere classified: Secondary | ICD-10-CM

## 2014-09-16 DIAGNOSIS — I972 Postmastectomy lymphedema syndrome: Secondary | ICD-10-CM | POA: Diagnosis not present

## 2014-09-16 NOTE — Therapy (Signed)
[place], [place], [address] Phone: [phone]   Fax:  [phone]  Physical Therapy Treatment  Patient Details  Name: Nicole Valentine MRN: [id number] Date of Birth: 1966-01-01 Referring Provider:  Carol Ada, MD  Encounter Date: 09/16/2014      PT End of Session - 09/16/14 1115    Visit Number 10   Number of Visits 18   Date for PT Re-Evaluation 10/05/14   PT Start Time 1108   PT Stop Time 1148   PT Time Calculation (min) 40 min      Past Medical History  Diagnosis Date  . Complication of anesthesia     her father and uncle both had very difficult time waking up-was  something they told them may be hereditory. she will find out.  [name] Hypothyroidism   . 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 Radiation 01/21/13-03/06/13    Right Breast  . Breast cancer dx'd 04-10-12-rt    Past Surgical History  Procedure Laterality Date  . Wisdom tooth extraction    . Portacath placement  04/22/2012    Procedure: INSERTION PORT-A-CATH;  Surgeon: [name], MD;  Location: [place];  Service: General;  Laterality: Left;  [name] Modified mastectomy Right 11/18/2012    Procedure:  RIGHT MODIFIED MASTECTOMY;  Surgeon: [name], MD;  Location: [place];  Service: General;  Laterality: Right;  . Knee arthroscopy Right 11/26/2013  . Port-a-cath removal Left 12/10/2013    Procedure: MINOR REMOVAL PORT-A-CATH;  Surgeon: [name], MD;  Location: [place];  Service: General;  Laterality: Left;    There were no vitals filed for this visit.  Visit Diagnosis:  Lymphedema syndrome, postmastectomy  Stiffness of joint, shoulder region, right      Subjective Assessment - 09/16/14 1110    Subjective Got a scratch on my eye last night so the bright light is bothering it and got antibiotic eye drops.    Currently in Pain? No/denies                          [facility] Adult PT Treatment/Exercise - 09/16/14 0001    Manual Therapy   Manual Lymphatic Drainage (MLD) In Supine: Short neck, Lt axilla and Rt Inguinal nodes, superficial and deep abdominals, anterior inter-axillary and Rt axillo-inguinal anastomosis, and Rt UE from fingers/dorsal hand to lateral shoulder.    Compression Bandaging Lotion applied. Thick stockinette, Elastomull to all 5 fingers, Artiflex x1 with 1/4" gray foam piece in stockinette at anterior elbow, and 1-6, 1-10 and 1-12 cm short stretch compression bandages from hand to axilla.                    Short Term Clinic Goals - 08/11/14 1300    CC Short Term Goal  #1   Title short term goals = long term goals             Long Term Clinic Goals - 09/14/14 1239    CC Long Term Goal  #1   Status On-going   CC Long Term Goal  #2   Status On-going   CC Long Term Goal  #3   Status On-going   CC Long Term Goal  #4   Baseline (This may be an incorrect goal, based on realistic measurements for this place on arm.  D.[place], PT 09/14/14)  Plan - 09/16/14 1149    Clinical Impression Statement Pts came in with bandages removed, had taken them off earlier this morning to shower. Upon inspection redness is present at anterior elbow with small red dots which pt reported she has had before. She brought hydrocortisone cream which was applied to areas of redness before bandaging today. Spoke with pt about trying her most recent sleeve which was too tight beofre we started this episode of care, but when checked by our fitter Hoyle Sauer recently, was told its a good fit now. So when bandages come off pt will try wearing that instead of rewrapping.    Pt will benefit from skilled therapeutic intervention in order to improve on the following deficits Increased fascial restricitons;Increased edema;Decreased knowledge of use of DME;Decreased scar mobility   Rehab Potential Excellent    Clinical Impairments Affecting Rehab Potential 14 lymph nodes removed.  longstanding lymphedema with fibrosis, previous surgery and radiation to upper quadrant   PT Frequency 3x / week   PT Duration 3 weeks   PT Treatment/Interventions Manual techniques;Manual lymph drainage;Compression bandaging   PT Next Visit Plan continue myofascial release and complete decongestive therapy   Consulted and Agree with Plan of Care Patient        Problem List Patient Active Problem List   Diagnosis Date Noted  . Lymphedema of arm 10/03/2013  . Knee pain, right 10/03/2013  . GERD (gastroesophageal reflux disease) 10/03/2013  . History of breast cancer 10/03/2013  . Breast cancer of upper-outer quadrant of right female breast 01/28/2013  . 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

## 2014-09-22 ENCOUNTER — Telehealth: Payer: Self-pay | Admitting: Oncology

## 2014-09-22 ENCOUNTER — Other Ambulatory Visit: Payer: Self-pay | Admitting: Oncology

## 2014-09-22 ENCOUNTER — Telehealth: Payer: Self-pay | Admitting: Nurse Practitioner

## 2014-09-22 ENCOUNTER — Telehealth: Payer: Self-pay | Admitting: *Deleted

## 2014-09-22 ENCOUNTER — Ambulatory Visit (HOSPITAL_BASED_OUTPATIENT_CLINIC_OR_DEPARTMENT_OTHER): Payer: BLUE CROSS/BLUE SHIELD | Admitting: Nurse Practitioner

## 2014-09-22 VITALS — BP 132/85 | HR 71 | Temp 98.4°F | Resp 16 | Wt 187.8 lb

## 2014-09-22 DIAGNOSIS — I890 Lymphedema, not elsewhere classified: Secondary | ICD-10-CM | POA: Diagnosis not present

## 2014-09-22 DIAGNOSIS — R221 Localized swelling, mass and lump, neck: Secondary | ICD-10-CM | POA: Diagnosis not present

## 2014-09-22 DIAGNOSIS — C50411 Malignant neoplasm of upper-outer quadrant of right female breast: Secondary | ICD-10-CM

## 2014-09-22 NOTE — Telephone Encounter (Signed)
Pt aware of D/T per 05/03 POF added pt to CB schedule... KJ

## 2014-09-22 NOTE — Telephone Encounter (Signed)
Left message to confirm 05/31 visit.

## 2014-09-22 NOTE — Telephone Encounter (Signed)
Pt left a voice mail stating she has a lump on collarbone on right side of neck, noticed Saturday. Very anxious and would like it checked ASAP. RN attempted to call back-LM to call us.  Will forward to Dr [name]

## 2014-09-22 NOTE — Telephone Encounter (Signed)
Pt to come in today to see Nicole Lesser, NP to look at lump. Dr [name] states he will come over to Cyndee's office when she gets here. Pt verbalized understanding

## 2014-09-25 ENCOUNTER — Telehealth: Payer: Self-pay | Admitting: *Deleted

## 2014-09-25 ENCOUNTER — Encounter: Payer: Self-pay | Admitting: Nurse Practitioner

## 2014-09-25 DIAGNOSIS — R221 Localized swelling, mass and lump, neck: Secondary | ICD-10-CM | POA: Insufficient documentation

## 2014-09-25 NOTE — Assessment & Plan Note (Signed)
Patient completed her chemotherapy in May 2014.  She underwent a right breast mastectomy in June 2014.  She completed radiation therapy in October 2014.  Currently continues with observation only.  Patient notes a recently discovered pea-sized mass to her right neck in the supra clavicular region.  She denies any tenderness to the site.  The plan is for the patient to return on 10/20/2014 for follow-up visit to reassess this area.  Patient has plans to return to the [facility] for labs only on 01/12/2015.  Patient will return on 01/19/2015 for a follow-up visit.  He can.

## 2014-09-25 NOTE — Progress Notes (Signed)
SYMPTOM MANAGEMENT CLINIC   HPI: Nicole Valentine 49 y.o. female diagnosed with breast cancer.  Patient is status post mastectomy, chemotherapy, and radiation therapy.  Currently undergoing observation only.  Patient notes a recently discovered a mass to her right neck in the supraclavicular region.  She denies any tenderness to the site.  Patient has been complaining of some mild URI/allergy-type symptoms for the past week or so.  She denies any recent fevers or chills.  HPI  ROS  Past Medical History  Diagnosis Date  . Complication of anesthesia     her father and uncle both had very difficult time waking up-was  something they told them may be hereditory. she will find out.  [name] Hypothyroidism   . 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 Radiation 01/21/13-03/06/13    Right Breast  . Breast cancer dx'd 04-10-12-rt    Past Surgical History  Procedure Laterality Date  . Wisdom tooth extraction    . Portacath placement  04/22/2012    Procedure: INSERTION PORT-A-CATH;  Surgeon: [name], MD;  Location: [place];  Service: General;  Laterality: Left;  [name] Modified mastectomy Right 11/18/2012    Procedure:  RIGHT MODIFIED MASTECTOMY;  Surgeon: [name], MD;  Location: [facility];  Service: General;  Laterality: Right;  . Knee arthroscopy Right 11/26/2013  . Port-a-cath removal Left 12/10/2013    Procedure: MINOR REMOVAL PORT-A-CATH;  Surgeon: [name], MD;  Location: [facility];  Service: General;  Laterality: Left;    has Abdominal wall anomaly; Breast cancer of upper-outer quadrant of right female breast; Lymphedema of arm; Knee pain, right; GERD (gastroesophageal reflux disease); History of breast cancer; and Mass of neck on her problem list.    is allergic to almond meal; cashew nut oil; mango flavor; and other.    Medication List       This list is accurate as of: 09/22/14 11:59 PM.  Always use your most recent  med list.               levothyroxine 150 MCG tablet  Commonly known as:  SYNTHROID, LEVOTHROID  Take 150 mcg by mouth daily before breakfast.         PHYSICAL EXAMINATION  Oncology Vitals 09/22/2014 07/16/2014 04/06/2014 01/12/2014 12/10/2013 12/10/2013 12/05/2013  Height - 170 cm 170 cm 170 cm - - 170 cm  Weight 85.186 kg 86.047 kg 86.365 kg 83.462 kg - - 83.915 kg  Weight (lbs) 187 lbs 13 oz 189 lbs 11 oz 190 lbs 6 oz 184 lbs - - 185 lbs  BMI (kg/m2) - 29.71 kg/m2 29.82 kg/m2 28.82 kg/m2 - - 28.97 kg/m2  Temp 98.4 98.3 97.8 98.3 98.3 98.6 -  Pulse 71 64 68 72 82 72 -  Resp _0 -  SpO2 - - - 99 98 97 -  BSA (m2) - 2.02 m2 2.02 m2 1.99 m2 - - 1.99 m2   BP Readings from Last 3 Encounters:  09/22/14 132/85  07/16/14 119/79  04/06/14 130/81    Physical Exam  Constitutional: She is oriented to person, place, and time and well-developed, well-nourished, and in no distress.  HENT:  Head: Normocephalic and atraumatic.  Eyes: Conjunctivae and EOM are normal. Pupils are equal, round, and reactive to light. Right eye exhibits no discharge. Left eye exhibits no discharge. No scleral icterus.  Neck: Normal range of motion. Neck supple.  Pulmonary/Chest: Effort  normal. No respiratory distress.  Musculoskeletal: Normal range of motion. She exhibits no edema or tenderness.  On exam- pea- sized firm nodule to the right supraclavicular region.  There is no erythema, surrounding edema, warmth, tenderness, or red streaks to site.        Neurological: She is alert and oriented to person, place, and time. Gait normal.  Skin: Skin is warm and dry. No rash noted. No erythema. No pallor.  Psychiatric: Affect normal.  Nursing note and vitals reviewed.   LABORATORY DATA:. No visits with results within 3 Day(s) from this visit. Latest known visit with results is:  Appointment on 07/08/2014  Component Date Value Ref Range Status  . Sodium 07/08/2014 142  136 - 145 mEq/L Final    . Potassium 07/08/2014 4.1  3.5 - 5.1 mEq/L Final  . Chloride 07/08/2014 105  98 - 109 mEq/L Final  . CO2 07/08/2014 26  22 - 29 mEq/L Final  . Glucose 07/08/2014 87  70 - 140 mg/dl Final  . BUN 07/08/2014 14.6  7.0 - 26.0 mg/dL Final  . Creatinine 07/08/2014 0.8  0.6 - 1.1 mg/dL Final  . Total Bilirubin 07/08/2014 0.60  0.20 - 1.20 mg/dL Final  . Alkaline Phosphatase 07/08/2014 105  40 - 150 U/L Final  . AST 07/08/2014 31  5 - 34 U/L Final  . ALT 07/08/2014 23  0 - 55 U/L Final  . Total Protein 07/08/2014 7.2  6.4 - 8.3 g/dL Final  . Albumin 07/08/2014 3.9  3.5 - 5.0 g/dL Final  . Calcium 07/08/2014 9.4  8.4 - 10.4 mg/dL Final  . Anion Gap 07/08/2014 10  3 - 11 mEq/L Final  . EGFR 07/08/2014 >90  >90 ml/min/1.73 m2 Final   eGFR is calculated using the CKD-EPI Creatinine Equation (2009)  . WBC 07/08/2014 4.8  3.9 - 10.3 10e3/uL Final  . NEUT# 07/08/2014 3.2  1.5 - 6.5 10e3/uL Final  . HGB 07/08/2014 14.9  11.6 - 15.9 g/dL Final  . HCT 07/08/2014 44.5  34.8 - 46.6 % Final  . Platelets 07/08/2014 157  145 - 400 10e3/uL Final  . MCV 07/08/2014 101.6* 79.5 - 101.0 fL Final  . MCH 07/08/2014 33.9  25.1 - 34.0 pg Final  . MCHC 07/08/2014 33.4  31.5 - 36.0 g/dL Final  . RBC 07/08/2014 4.38  3.70 - 5.45 10e6/uL Final  . RDW 07/08/2014 12.8  11.2 - 14.5 % Final  . lymph# 07/08/2014 1.3  0.9 - 3.3 10e3/uL Final  . MONO# 07/08/2014 0.2  0.1 - 0.9 10e3/uL Final  . Eosinophils Absolute 07/08/2014 0.1  0.0 - 0.5 10e3/uL Final  . Basophils Absolute 07/08/2014 0.0  0.0 - 0.1 10e3/uL Final  . NEUT% 07/08/2014 66.3  38.4 - 76.8 % Final  . LYMPH% 07/08/2014 26.8  14.0 - 49.7 % Final  . MONO% 07/08/2014 4.5  0.0 - 14.0 % Final  . EOS% 07/08/2014 1.6  0.0 - 7.0 % Final  . BASO% 07/08/2014 0.8  0.0 - 2.0 % Final     RADIOGRAPHIC STUDIES: No results found.  ASSESSMENT/PLAN:    Breast cancer of upper-outer quadrant of right female breast Patient completed her chemotherapy in May 2014.  She  underwent a right breast mastectomy in June 2014.  She completed radiation therapy in October 2014.  Currently continues with observation only.  Patient notes a recently discovered pea-sized mass to her right neck in the supra clavicular region.  She denies any tenderness to the site.  The plan is for the patient to return on 10/20/2014 for follow-up visit to reassess this area.  Patient has plans to return to the [facility] for labs only on 01/12/2015.  Patient will return on 01/19/2015 for a follow-up visit.  He can.   Lymphedema of arm Patient is status post right breast mastectomy in June 2014.  She does have some mild to moderate right upper extremity lymphedema.  She has been to the lymphedema clinic in the past for lymphedema massage technique instruction.  She also has a lymphedema compression sleeve to use at home.  She was encouraged to once again try the lymphedema massage and to wear her sleeve as directed.   Mass of neck Patient notes a recently discovered a mass to her right neck in the supraclavicular region.  She denies any tenderness to the site.  On exam- pea- sized firm nodule to the right supraclavicular region.  There is no erythema, surrounding edema, warmth, tenderness, or red streaks to site.  Patient has been complaining of some mild URI/allergy-type symptoms for the past week or so.  She denies any recent fevers or chills.  Small right neck mass may very well be an enlarged inflammatory lymph node secondary to allergy/URI symptoms.  Plan is for the patient to return on 10/20/2014 for reassessment of mass.   Patient was encouraged to call/return of directly to the emergency for any new or worsening symptoms whatsoever.     Patient stated understanding of all instructions; and was in agreement with this plan of care. The patient knows to call the clinic with any problems, questions or concerns.   This was a shared visit with Dr. [name] today.  Total time  spent with patient was 25 minutes;  with greater than 75 percent of that time spent in face to face counseling regarding patient's symptoms,  and coordination of care and follow up.  Disclaimer: This note was dictated with voice recognition software. Similar sounding words can inadvertently be transcribed and may not be corrected upon review.   Drue Second, NP 09/25/2014    ADDENDUM: Exam suggests a small lymph node which likely will resolve without intervention. She will see me again though in 2 weeks and we will repeat the exam at that time. If the small mass persists or has increased in size we may consider excisional biopsy.  I discussed this with Nicole Valentine. She has a good understanding of this plan and agrees with it.I personally saw this patient and performed a substantive portion of this encounter with the listed APP documented above.   Chauncey Cruel, MD Medical Oncology and Hematology [facility] [address] [place], [place] [address] Tel. [phone]    Fax. [phone]

## 2014-09-25 NOTE — Assessment & Plan Note (Signed)
Patient is status post right breast mastectomy in June 2014.  She does have some mild to moderate right upper extremity lymphedema.  She has been to the lymphedema clinic in the past for lymphedema massage technique instruction.  She also has a lymphedema compression sleeve to use at home.  She was encouraged to once again try the lymphedema massage and to wear her sleeve as directed.

## 2014-09-25 NOTE — Assessment & Plan Note (Signed)
Patient notes a recently discovered a mass to her right neck in the supraclavicular region.  She denies any tenderness to the site.  On exam- pea- sized firm nodule to the right supraclavicular region.  There is no erythema, surrounding edema, warmth, tenderness, or red streaks to site.  Patient has been complaining of some mild URI/allergy-type symptoms for the past week or so.  She denies any recent fevers or chills.  Small right neck mass may very well be an enlarged inflammatory lymph node secondary to allergy/URI symptoms.  Plan is for the patient to return on 10/20/2014 for reassessment of mass.   Patient was encouraged to call/return of directly to the emergency for any new or worsening symptoms whatsoever.

## 2014-09-25 NOTE — Telephone Encounter (Signed)
Patient states that she needs a prescription for a reidsleeve. [place]. Message sent to MD and RN.

## 2014-09-30 ENCOUNTER — Telehealth: Payer: Self-pay | Admitting: *Deleted

## 2014-09-30 NOTE — Telephone Encounter (Signed)
Call returned to Amy - spoke with Ivin Booty- form will be faxed directly to this RN .

## 2014-09-30 NOTE — Telephone Encounter (Signed)
VM message from Amy @ [facility] requesting a prescription for medical necessity re: reid sleeve  Please contact Amy @ [phone]

## 2014-10-12 ENCOUNTER — Telehealth: Payer: Self-pay | Admitting: Oncology

## 2014-10-12 NOTE — Telephone Encounter (Signed)
Patient confirmed appointment change for August 30 due to call day moved to earlier. Has MyChart and will verify on there.

## 2014-10-15 ENCOUNTER — Telehealth: Payer: Self-pay | Admitting: *Deleted

## 2014-10-15 NOTE — Telephone Encounter (Signed)
TC from pt stating that she cut her left thumb with a very sharp, clean kitchen knife on Tuesday.  This is the extremity she has lymphedema, wearing a sleeve etc. She cleaned the knife and has it bandaged with neosporin.  She states it is scabbing over, no swelling, no redness, no drainage and no pain.  Her tetanus vaccination is up to date (2012).  She just wanted to make sure she doesn't need to do anything else.  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  Pt. Is not receiving active chemo at this time.  Advised her to continue as she has been but if anything changes such as redness, swelling, drainage, pain,  Fever, red streaks etc to go to urgent care as soon as possible.  Pt verbalized understanding.

## 2014-10-15 NOTE — Telephone Encounter (Signed)
Thank you- val

## 2014-10-20 ENCOUNTER — Other Ambulatory Visit: Payer: BLUE CROSS/BLUE SHIELD

## 2014-10-20 ENCOUNTER — Ambulatory Visit (HOSPITAL_BASED_OUTPATIENT_CLINIC_OR_DEPARTMENT_OTHER): Payer: BLUE CROSS/BLUE SHIELD | Admitting: Oncology

## 2014-10-20 VITALS — BP 136/76 | HR 75 | Temp 98.2°F | Resp 18 | Ht 67.0 in | Wt 186.1 lb

## 2014-10-20 DIAGNOSIS — H538 Other visual disturbances: Secondary | ICD-10-CM

## 2014-10-20 DIAGNOSIS — C50411 Malignant neoplasm of upper-outer quadrant of right female breast: Secondary | ICD-10-CM | POA: Diagnosis not present

## 2014-10-20 DIAGNOSIS — C773 Secondary and unspecified malignant neoplasm of axilla and upper limb lymph nodes: Secondary | ICD-10-CM

## 2014-10-20 DIAGNOSIS — R599 Enlarged lymph nodes, unspecified: Secondary | ICD-10-CM | POA: Diagnosis not present

## 2014-10-20 DIAGNOSIS — Z171 Estrogen receptor negative status [ER-]: Secondary | ICD-10-CM | POA: Diagnosis not present

## 2014-10-20 DIAGNOSIS — J3489 Other specified disorders of nose and nasal sinuses: Secondary | ICD-10-CM

## 2014-10-22 ENCOUNTER — Other Ambulatory Visit: Payer: Self-pay | Admitting: Oncology

## 2014-10-22 NOTE — Progress Notes (Unsigned)
Called Shelita to let her

## 2014-10-22 NOTE — Progress Notes (Signed)
And a Patient ID: Nicole Valentine, female   DOB: August 17, 1965, 49 y.o.   MRN: [id number] ID: [name] OB: 1965/11/04  MR#: [id number]  CSN#:[id number]  PCP: [name], MD GYN:   SU: [name]); [name] OTHER MD: [name], [name]  CHIEF COMPLAINT:  Hx of Right Breast Cancer (triple negative)  CURRENT TREATMENT: Observation  BREAST CANCER HISTORY: From Doctor Khan's intake notes 11/10/2012:  "She originally palpated a right breast mass. She had a mammogram performed that showed dense breasts bilaterally. Ultrasound of the right breast showed 2.3 x 1.9 cm area of abnormality with multiple abnormal lymph nodes. MRI of the bilateral breasts showed asymmetrical enhancement throughout the right breast consistent with multicentric disease. An ultrasound-guided biopsy performed. The known area of disease measured 1.9 x 1.9 x 2.3 cm. Multiple abnormal positive right axillary lymph nodes were noted. 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 Biopsy of the right axillary lymph node was positive for malignancy with extracapsular extension. Patient was originally seen by Dr. [name] Dr. [name] and Dr. [name]. She has elected to have a right mastectomy eventually and declined biopsies of any other areas within the breast."  Her subsequent history is as detailed below.  INTERVAL HISTORY: Nicole Valentine returns today for followup of her triple negative right breast cancer. To review the recent developments: She was seen here in early May for evaluation of "a small mass" on her right supraclavicular area. This was a 2-3 mm lymph node which I had not previously palpated and she felt was new. We decided to wait a little bit to see if this resolved as she was having significant sinus issues at the time. She is here today to evaluate that further.  REVIEW OF SYSTEMS: Amadea continues to have "lots of drainage".  She wonders if these are allergies. She did pick up a cold from her husband she says but has gotten over that. She is mostly using [place] necks and they quell 4 treatments. She doesn't have a significant cough, hoarseness, or fever. He is just "sinus". In addition to that she sees "sparkle's" sometimes during the day or when she wakes up at night. This is happening more frequently, up to 4 times a day at present. Again, these were not accompanied by headaches, dizziness, nausea, vomiting, or other symptoms. She tells me she she saw her optometrist who dilated her pupils and did a rectal exam which apparently was unremarkable. He felt she might have retinal migraines. Finally the "bump" on the right supraclavicular area is still they're "and there is a second 1 I think". Aside from these issues, she has mild back pain which is not more intense or persistent than before. A detailed review of systems today was otherwise stable  PAST MEDICAL HISTORY: Past Medical History  Diagnosis Date  . Complication of anesthesia     her father and uncle both had very difficult time waking up-was  something they told them may be hereditory. she will find out.  [name] Hypothyroidism   . 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 Radiation 01/21/13-03/06/13    Right Breast  . Breast cancer dx'd 04-10-12-rt    PAST SURGICAL HISTORY: Past Surgical History  Procedure Laterality Date  . Wisdom tooth extraction    . Portacath placement  04/22/2012    Procedure: INSERTION PORT-A-CATH;  Surgeon: [name], MD;  Location: [facility];  Service: General;  Laterality: Left;  [name] Modified mastectomy Right 11/18/2012    Procedure:  RIGHT MODIFIED MASTECTOMY;  Surgeon: [name], MD;  Location: [facility];  Service: General;  Laterality: Right;  . Knee arthroscopy Right 11/26/2013  . Port-a-cath removal Left 12/10/2013    Procedure: MINOR REMOVAL PORT-A-CATH;  Surgeon: [name], MD;  Location:  [facility];  Service: General;  Laterality: Left;    FAMILY HISTORY Family History  Problem Relation Age of Onset  . Lung cancer Maternal Grandfather 2    smoker  . Prostate cancer Maternal Grandfather 90  . Throat cancer Other     Great Aunt x 2  . Liver cancer Other     Maternal Great Grandmother  . Melanoma Maternal Uncle 81  . Brain cancer Cousin 11    non-malignant  the patient's parents are living, in their early 14s. The patient has 2 brothers, no sisters. The patient's mother was diagnosed with breast cancer, HER-2 positive, in 2014 in [place].  GYNECOLOGIC HISTORY:   (Updated 10/03/2013) Menarche age 89, first live birth age 47. She is GX P4. LMP January 2014. 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  The children are currently ages 66, 55, 61, and 27. Her husband, [name], is a Customer service manager. They attend the [employer]: Not in place   HEALTH MAINTENANCE:  (Updated 10/03/2013) History  Substance Use Topics  . Smoking status: Never Smoker   . Smokeless tobacco: Never Used  . Alcohol Use: No     Colonoscopy: Never  PAP: November 2013/Dr. Smith  Bone density: January 2015, [facility], normal  Lipid panel:  Not on file  Allergies  Allergen Reactions  . Almond Meal Rash  . Cashew Nut Oil Rash  . Mango Flavor Rash  . Other Rash    LOTIONS    Current Outpatient Prescriptions  Medication Sig Dispense Refill  . levothyroxine (SYNTHROID, LEVOTHROID) 150 MCG tablet Take 150 mcg by mouth daily before breakfast.     No current facility-administered medications for this visit.      OBJECTIVE: Middle-aged white woman who appears well Filed Vitals:   10/20/14 1246  BP: 136/76  Pulse: 75  Temp: 98.2 F (36.8 C)  Resp: 18     Body mass index is 29.14 kg/(m^2).    ECOG FS:1 - Symptomatic but completely ambulatory Filed Weights    10/20/14 1246  Weight: 186 lb 1.6 oz (84.414 kg)   Sclerae unicteric, EOMs intact, pupils equal and reactive Oropharynx clear, dentition in good repair The 2-3 mm right supraclavicular presumed lymph node is still present. It does not seem changed from a few weeks ago. I do not palpate a clear second mass lateral to this. I think what she may be feeling the area is muscle insertion. There is no cervical adenopathy. Lungs no rales or rhonchi Heart regular rate and rhythm Abd soft, nontender, positive bowel sounds MSK no focal spinal tenderness, no upper extremity lymphedema Neuro: nonfocal, well oriented, appropriate affect Breasts: The right breast is status post mastectomy and radiation. There is no evidence of chest wall recurrence. The right axilla is benign. The left breast is unremarkable.   LAB RESULTS:     Lab Results  Component Value Date   WBC 4.8 07/08/2014   NEUTROABS 3.2 07/08/2014   HGB 14.9 07/08/2014   HCT 44.5 07/08/2014  MCV 101.6* 07/08/2014   PLT 157 07/08/2014      Chemistry      Component Value Date/Time   NA 142 07/08/2014 1205   NA 142 11/15/2012 1215   K 4.1 07/08/2014 1205   K 3.7 11/15/2012 1215   CL 104 11/15/2012 1215   CL 101 10/18/2012 0859   CO2 26 07/08/2014 1205   CO2 30 11/15/2012 1215   BUN 14.6 07/08/2014 1205   BUN 8 11/15/2012 1215   CREATININE 0.8 07/08/2014 1205   CREATININE 0.67 11/15/2012 1215      Component Value Date/Time   CALCIUM 9.4 07/08/2014 1205   CALCIUM 9.3 11/15/2012 1215   ALKPHOS 105 07/08/2014 1205   AST 31 07/08/2014 1205   ALT 23 07/08/2014 1205   BILITOT 0.60 07/08/2014 1205       STUDIES:  A bone density at [facility] on 06/06/2013 was normal.  A left mammogram at [facility] on 07/03/2014 was unremarkable; density category C.   ASSESSMENT: 49 y.o. BRCA negative [employer] woman  (1) an status post right breast upper outer quadrant and right axillary lymph node biopsy 04/04/2012, both positive for an  invasive ductal carcinoma, grade 3, triple negative, with an MIB-1 of 25%  (2) Treated neoadjuvantly with  (a) fluorouracil, cyclophosphamide, and epirubicin (at 100 mg/M2) x4 completed 06/07/2012  (b) docetaxel (75 mg/M2) for one dose, 06/21/2012, poorly tolerated  (c) carboplatin and gemcitabine given every 21 days for 6 cycles completed 10/11/2012  (3) status post right modified radical mastectomy 11/18/2012 showing a complete pathologic response--all 16 lymph nodes were benign  (4) adjuvant radiation therapy completed 03/06/2013  (5) no plans for reconstructon  PLAN: Leeanne's small right supraclavicular lymph node is persistent and there may be a second smaller lymph node coming up laterally to it. I think we may need to biopsy this but probably the first thing to do is to obtain a CT scan of the neck in case there are more accessible lesions or more disease than we are aware of. Based on the above results then we can refer her to her surgeon, Dr. Barry Dienes, for biopsy as appropriate.  Since she is having visual changes that I cannot readily explain (they may indeed be ocular migraines as suspected) I am going to add a head CT with contrast just to make sure there is not metastatic disease, which is of course but she is concerned about..  She is having a lot of sinus drainage, which might be the real problem, I have suggested she try Claritin or Zyrtec.  She already has an appointment with me for late August. We will leave that in place. She will call me within a day or 2 of her CT scans to get results and we will continue to move the workup along so we will have whatever results develop by the time she sees me at the next meeting. Chauncey Cruel, MD   10/22/2014 8:30 AM

## 2014-10-26 ENCOUNTER — Telehealth: Payer: Self-pay | Admitting: *Deleted

## 2014-10-26 NOTE — Telephone Encounter (Signed)
TC from patient this am regarding scheduling of needle biopsy of right supraclavicular lymph node.  She states she will be in town on 10/29/14 and is willing to have it done that day. CT scans are scheduled for 10/28/14

## 2014-10-27 ENCOUNTER — Other Ambulatory Visit: Payer: Self-pay | Admitting: Oncology

## 2014-10-27 DIAGNOSIS — C50919 Malignant neoplasm of unspecified site of unspecified female breast: Secondary | ICD-10-CM

## 2014-10-28 ENCOUNTER — Other Ambulatory Visit: Payer: Self-pay | Admitting: Oncology

## 2014-10-28 ENCOUNTER — Ambulatory Visit (HOSPITAL_COMMUNITY)
Admission: RE | Admit: 2014-10-28 | Discharge: 2014-10-28 | Disposition: A | Discharge status: Home / Self Care | Payer: BLUE CROSS/BLUE SHIELD | Source: Ambulatory Visit | Attending: Oncology | Admitting: Oncology

## 2014-10-28 ENCOUNTER — Telehealth: Payer: Self-pay | Admitting: *Deleted

## 2014-10-28 ENCOUNTER — Encounter (HOSPITAL_COMMUNITY): Payer: Self-pay

## 2014-10-28 DIAGNOSIS — C50411 Malignant neoplasm of upper-outer quadrant of right female breast: Secondary | ICD-10-CM

## 2014-10-28 DIAGNOSIS — R599 Enlarged lymph nodes, unspecified: Secondary | ICD-10-CM | POA: Diagnosis not present

## 2014-10-28 DIAGNOSIS — J90 Pleural effusion, not elsewhere classified: Secondary | ICD-10-CM | POA: Insufficient documentation

## 2014-10-28 DIAGNOSIS — H538 Other visual disturbances: Secondary | ICD-10-CM | POA: Insufficient documentation

## 2014-10-28 DIAGNOSIS — C50911 Malignant neoplasm of unspecified site of right female breast: Secondary | ICD-10-CM

## 2014-10-28 DIAGNOSIS — Z9221 Personal history of antineoplastic chemotherapy: Secondary | ICD-10-CM | POA: Insufficient documentation

## 2014-10-28 MED ORDER — IOHEXOL 300 MG/ML  SOLN
100.0000 mL | Freq: Once | INTRAMUSCULAR | Status: AC | PRN
Start: 2014-10-28 — End: 2014-10-28
  Administered 2014-10-28: 100 mL via INTRAVENOUS

## 2014-10-28 NOTE — Progress Notes (Unsigned)
I called Nicole Valentine with the results of her scans today. The brain is fine, but there is a large right pleural effusion and there is significant right-sided adenopathy.  I think the easiest thing to do at this point would be to obtain a right thoracentesis for cytology with a breast prognostic panel. That would probably tell us all we need to know. She is agreeable to this. She was planning to leave for [place] (driving) tomorrow, which she will postpone it 1 day. Her son, who is 21, would be drawing most of the driving, she says, in any case. She is returning on June 16, and I will arrange for her to see me shortly after that to discuss results.

## 2014-10-29 ENCOUNTER — Ambulatory Visit (HOSPITAL_COMMUNITY)
Admission: RE | Admit: 2014-10-29 | Discharge: 2014-10-29 | Disposition: A | Discharge status: Home / Self Care | Payer: BLUE CROSS/BLUE SHIELD | Source: Ambulatory Visit | Attending: Oncology | Admitting: Oncology

## 2014-10-29 ENCOUNTER — Ambulatory Visit (HOSPITAL_COMMUNITY)
Admission: RE | Admit: 2014-10-29 | Discharge: 2014-10-29 | Disposition: A | Discharge status: Home / Self Care | Payer: BLUE CROSS/BLUE SHIELD | Source: Ambulatory Visit | Attending: Radiology | Admitting: Radiology

## 2014-10-29 ENCOUNTER — Other Ambulatory Visit: Payer: Self-pay | Admitting: Oncology

## 2014-10-29 ENCOUNTER — Telehealth: Payer: Self-pay | Admitting: *Deleted

## 2014-10-29 DIAGNOSIS — Z853 Personal history of malignant neoplasm of breast: Secondary | ICD-10-CM | POA: Insufficient documentation

## 2014-10-29 DIAGNOSIS — R0609 Other forms of dyspnea: Secondary | ICD-10-CM | POA: Diagnosis not present

## 2014-10-29 DIAGNOSIS — C50911 Malignant neoplasm of unspecified site of right female breast: Secondary | ICD-10-CM

## 2014-10-29 DIAGNOSIS — J90 Pleural effusion, not elsewhere classified: Secondary | ICD-10-CM | POA: Insufficient documentation

## 2014-10-29 DIAGNOSIS — Z9889 Other specified postprocedural states: Secondary | ICD-10-CM

## 2014-10-29 LAB — BODY FLUID CELL COUNT WITH DIFFERENTIAL
LYMPHS FL: 27 %
Monocyte-Macrophage-Serous Fluid: 30 % — ABNORMAL LOW (ref 50–90)
Neutrophil Count, Fluid: 43 % — ABNORMAL HIGH (ref 0–25)
WBC FLUID: 1266 uL — AB (ref 0–1000)

## 2014-10-29 LAB — PROTEIN, BODY FLUID: TOTAL PROTEIN, FLUID: 4.1 g/dL

## 2014-10-29 LAB — LACTATE DEHYDROGENASE, PLEURAL OR PERITONEAL FLUID: LD, Fluid: 260 U/L — ABNORMAL HIGH (ref 3–23)

## 2014-10-29 NOTE — Procedures (Signed)
US guided diagnostic/therapeutic right thoracentesis performed yielding 1 liter hazy, yellow fluid. The fluid was sent to the lab for preordered studies. F/u CXR pending. No immediate complications.

## 2014-10-29 NOTE — Telephone Encounter (Signed)
Patient called requesting results of today's thoracentesis fluid lab tests.  Will notify Dr. [name] of this request.  Also asked advice on wearing the flexi-touch garment for lymph fluid drainage in her arm leg and torso.  Advised to call PT with this request.  No further questions or needs at this time.

## 2014-10-30 ENCOUNTER — Telehealth: Payer: Self-pay | Admitting: *Deleted

## 2014-10-30 ENCOUNTER — Other Ambulatory Visit: Payer: Self-pay | Admitting: *Deleted

## 2014-10-30 ENCOUNTER — Telehealth: Payer: Self-pay | Admitting: Oncology

## 2014-10-30 DIAGNOSIS — R221 Localized swelling, mass and lump, neck: Secondary | ICD-10-CM

## 2014-10-30 DIAGNOSIS — C782 Secondary malignant neoplasm of pleura: Secondary | ICD-10-CM

## 2014-10-30 DIAGNOSIS — C50411 Malignant neoplasm of upper-outer quadrant of right female breast: Secondary | ICD-10-CM

## 2014-10-30 DIAGNOSIS — C50911 Malignant neoplasm of unspecified site of right female breast: Secondary | ICD-10-CM

## 2014-10-30 NOTE — Telephone Encounter (Signed)
Confirm appointment for 06/17.

## 2014-10-30 NOTE — Telephone Encounter (Signed)
PT. WOULD LIKE TO SPEAK WITH DR.[name].

## 2014-10-30 NOTE — Telephone Encounter (Signed)
This RN spoke with pt per MD with results of area of concern positive for breast cancer.  Questions answering.  Orders entered per MD for PET scan for restaging for treatment decisions.

## 2014-11-01 NOTE — Progress Notes (Signed)
And a Patient ID: Nicole Valentine, female   DOB: 04-11-1966, 49 y.o.   MRN: [id number] ID: Nicole Valentine OB: [birth year]  MR#: [id number]  CSN#:[id number]  PCP: Nicole Naas, MD GYN:   SU: Nicole Valentine); Nicole Valentine OTHER MD: Nicole Valentine, Nicole Valentine  CHIEF COMPLAINT:  Hx of Right Breast Cancer (triple negative)  CURRENT TREATMENT: Observation  BREAST CANCER HISTORY: From Doctor Valentine's intake notes 11/10/2012:  "She originally palpated a right breast mass. She had a mammogram performed that showed dense breasts bilaterally. Ultrasound of the right breast showed 2.3 x 1.9 cm area of abnormality with multiple abnormal lymph nodes. MRI of the bilateral breasts showed asymmetrical enhancement throughout the right breast consistent with multicentric disease. An ultrasound-guided biopsy performed. The known area of disease measured 1.9 x 1.9 x 2.3 cm. Multiple abnormal positive right axillary lymph nodes were noted. 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 Biopsy of the right axillary lymph node was positive for malignancy with extracapsular extension. Patient was originally seen by Dr. Margot Valentine Dr. Truddie Valentine and Dr. Pablo Valentine. She has elected to have a right mastectomy eventually and declined biopsies of any other areas within the breast."  Her subsequent history is as detailed below.  INTERVAL HISTORY: Nicole Valentine returns today for followup of her triple negative right breast cancer. To review the recent developments: She was seen here in early May for evaluation of "a small mass" on her right supraclavicular area. This was a 2-3 mm lymph node which I had not previously palpated and she felt was new. We decided to wait a little bit to see if this resolved as she was having significant sinus issues at the time. She is here today to evaluate that further.  REVIEW OF SYSTEMS: Nicole Valentine continues to have "lots of drainage".  She wonders if these are allergies. She did pick up a cold from her husband she says but has gotten over that. She is mostly using Nicole Valentine and they quell 4 treatments. She doesn't have a significant cough, hoarseness, or fever. He is just "sinus". In addition to that she sees "sparkle's" sometimes during the day or when she wakes up at night. This is happening more frequently, up to 4 times a day at present. Again, these were not accompanied by headaches, dizziness, nausea, vomiting, or other symptoms. She tells me she she saw her optometrist who dilated her pupils and did a rectal exam which apparently was unremarkable. He felt she might have retinal migraines. Finally the "bump" on the right supraclavicular area is still they're "and there is a second 1 I think". Aside from these issues, she has mild back pain which is not more intense or persistent than before. A detailed review of systems today was otherwise stable  PAST MEDICAL HISTORY: Past Medical History  Diagnosis Date  . Complication of anesthesia     her father and uncle both had very difficult time waking up-was  something they told them may be hereditory. she will find out.  [name] Hypothyroidism   . 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 Radiation 01/21/13-03/06/13    Right Breast  . Breast cancer dx'd 04-10-12-rt    PAST SURGICAL HISTORY: Past Surgical History  Procedure Laterality Date  . Wisdom tooth extraction    . Portacath placement  04/22/2012    Procedure: INSERTION PORT-A-CATH;  Surgeon: Nicole Lasso, MD;  Location: [facility];  Service: General;  Laterality: Left;  [name] Modified mastectomy Right 11/18/2012    Procedure:  RIGHT MODIFIED MASTECTOMY;  Surgeon: Nicole Lasso, MD;  Location: [facility];  Service: General;  Laterality: Right;  . Knee arthroscopy Right 11/26/2013  . Port-a-cath removal Left 12/10/2013    Procedure: MINOR REMOVAL PORT-A-CATH;  Surgeon: Nicole Hector, MD;  Location:  [facility];  Service: General;  Laterality: Left;    FAMILY HISTORY Family History  Problem Relation Age of Onset  . Lung cancer Maternal Grandfather 25    smoker  . Prostate cancer Maternal Grandfather 90  . Throat cancer Other     Great Aunt x 2  . Liver cancer Other     Maternal Great Grandmother  . Melanoma Maternal Uncle 81  . Brain cancer Cousin 11    non-malignant  the patient's parents are living, in their early 71s. The patient has 2 brothers, no sisters. The patient's mother was diagnosed with breast cancer, HER-2 positive, in 2014 in [place].  GYNECOLOGIC HISTORY:   (Updated 10/03/2013) Menarche age 36, first live birth age 65. She is GX P4. LMP January 2014. 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  The children are currently ages 72, 55, 72, and 41. Her husband, Nicole Valentine, is a Customer service manager. They attend the [place]: Not in place   HEALTH MAINTENANCE:  (Updated 10/03/2013) History  Substance Use Topics  . Smoking status: Never Smoker   . Smokeless tobacco: Never Used  . Alcohol Use: No     Colonoscopy: Never  PAP: November 2013/Nic[facility]  Bone density: January 2015, [facility], normal  Lipid panel:  Not on file  Allergies  Allergen Reactions  . Almond Meal Rash  . Cashew Nut Oil Rash  . Mango Flavor Rash  . Other Rash    LOTIONS    Current Outpatient Prescriptions  Medication Sig Dispense Refill  . levothyroxine (SYNTHROID, LEVOTHROID) 150 MCG tablet Take 175 mcg by mouth daily before breakfast.     No current facility-administered medications for this visit.      OBJECTIVE: Middle-aged white woman who appears well There were no vitals filed for this visit.   There is no weight on file to calculate BMI.    ECOG FS:1 - Symptomatic but completely ambulatory There were no vitals filed for this visit. Sclerae  unicteric, EOMs intact, pupils equal and reactive Oropharynx clear, dentition in good repair The 2-3 mm right supraclavicular presumed lymph node is still present. It does not seem changed from a few weeks ago. I do not palpate a clear second mass lateral to this. I think what she may be feeling the area is muscle insertion. There is no cervical adenopathy. Lungs no rales or rhonchi Heart regular rate and rhythm Abd soft, nontender, positive bowel sounds MSK no focal spinal tenderness, no upper extremity lymphedema Neuro: nonfocal, well oriented, appropriate affect Breasts: The right breast is status post mastectomy and radiation. There is no evidence of chest wall recurrence. The right axilla is benign. The left breast is unremarkable.   LAB RESULTS:     Lab Results  Component Value Date   WBC 4.8 07/08/2014   NEUTROABS 3.2 07/08/2014   HGB 14.9 07/08/2014   HCT 44.5 07/08/2014   MCV 101.6* 07/08/2014   PLT 157 07/08/2014      Chemistry  Component Value Date/Time   NA 142 07/08/2014 1205   NA 142 11/15/2012 1215   K 4.1 07/08/2014 1205   K 3.7 11/15/2012 1215   CL 104 11/15/2012 1215   CL 101 10/18/2012 0859   CO2 26 07/08/2014 1205   CO2 30 11/15/2012 1215   BUN 14.6 07/08/2014 1205   BUN 8 11/15/2012 1215   CREATININE 0.8 07/08/2014 1205   CREATININE 0.67 11/15/2012 1215      Component Value Date/Time   CALCIUM 9.4 07/08/2014 1205   CALCIUM 9.3 11/15/2012 1215   ALKPHOS 105 07/08/2014 1205   AST 31 07/08/2014 1205   ALT 23 07/08/2014 1205   BILITOT 0.60 07/08/2014 1205       STUDIES: Dg Chest 1 View  10/29/2014   CLINICAL DATA:  Post thoracentesis chest radiograph.  Cough.  EXAM: CHEST  1 VIEW  COMPARISON:  CT 10/28/2014.  FINDINGS: No pneumothorax. Small RIGHT pleural effusion with consolidation at the RIGHT base, probably representing atelectatic lung in the setting of previously seen large effusion. No LEFT pleural effusion. RIGHT axillary dissection  clips present.  IMPRESSION: 1. No pneumothorax status post RIGHT thoracentesis. 2. Residual collapse/consolidation of the RIGHT base.   Electronically Signed   By: Dereck Ligas M.D.   On: 10/29/2014 15:38   Ct Head W Wo Contrast  10/28/2014   CLINICAL DATA:  Breast cancer.  Visual changes.  EXAM: CT HEAD WITHOUT AND WITH CONTRAST  TECHNIQUE: Contiguous axial images were obtained from the base of the skull through the vertex without and with intravenous contrast  CONTRAST:  172m OMNIPAQUE IOHEXOL 300 MG/ML  SOLN  COMPARISON:  None.  FINDINGS: Ventricle size normal. Negative for acute or chronic ischemia. Negative for mass or edema. Negative for hemorrhage  Normal enhancement following contrast infusion.  Skull is normal.  IMPRESSION: Negative   Electronically Signed   By: CFranchot GalloM.D.   On: 10/28/2014 15:33   Ct Soft Tissue Neck W Contrast  10/28/2014   CLINICAL DATA:  Breast cancer. Small palpable lymph nodes right supraclavicular region.  EXAM: CT NECK WITH CONTRAST  TECHNIQUE: Multidetector CT imaging of the neck was performed using the standard protocol following the bolus administration of intravenous contrast.  CONTRAST:  1060mOMNIPAQUE IOHEXOL 300 MG/ML  SOLN  COMPARISON:  PET-CT 04/19/2012  FINDINGS: Pharynx and larynx: Negative. Well-defined density lateral to the right molars consistent with a lozenge or tablet. Negative larynx.  Salivary glands: Negative  Thyroid: Small thyroid gland without mass lesion.  Lymph nodes: Right supraclavicular lymph nodes are enlarged with heterogeneous enhancement compatible with metastatic disease. Multiple lymph nodes are seen around the right jugular vein measuring up to 12 mm. Right supraclavicular node 15 mm.  Left supraclavicular lymph node measures 9 x 20 mm and is most consistent with metastatic disease. Left posterior superior axillary node measures 11 mm and appears to represent a pathologic node. Left level 2 node is 5 mm and is indeterminate. Small  posterior lymph nodes are present on the left and are indeterminate. Right level 2 node measures 6 mm and is indeterminate.  Vascular: Jugular vein patent bilaterally. Carotid artery patent bilaterally.  Limited intracranial: Negative  Visualized orbits: Negative  Mastoids and visualized paranasal sinuses: Negative  Skeleton: Disc degeneration and spondylosis C5-6. No bony lesion identified.  Upper chest: Large right pleural effusion. Atelectasis in the right upper lobe.  IMPRESSION: Bilateral supraclavicular adenopathy compatible metastatic disease. Small level 2 and level 5 nodes are present bilaterally which  could be due to metastatic disease as well.  Large right pleural effusion, worrisome for malignancy. PET is recommended for further evaluation.   Electronically Signed   By: Franchot Gallo M.D.   On: 10/28/2014 15:48   US Thoracentesis Asp Pleural Space W/img Guide  10/29/2014   INDICATION: Patient with history of right breast cancer; now with some dyspnea with exertion and right pleural effusion. Request is made for diagnostic and therapeutic right thoracentesis.  EXAM: ULTRASOUND GUIDED DIAGNOSTIC AND THERAPEUTIC RIGHT THORACENTESIS  COMPARISON:  None.  MEDICATIONS: None  COMPLICATIONS: None immediate  TECHNIQUE: Informed written consent was obtained from the patient after a discussion of the risks, benefits and alternatives to treatment. A timeout was performed prior to the initiation of the procedure.  Initial ultrasound scanning demonstrates a moderate to large right pleural effusion. The lower chest was prepped and draped in the usual sterile fashion. 1% lidocaine was used for local anesthesia.  An ultrasound image was saved for documentation purposes. A 6 Fr Safe-T-Centesis catheter was introduced. The thoracentesis was performed. The catheter was removed and a dressing was applied. The patient tolerated the procedure well without immediate post procedural complication. The patient was escorted to  have an upright chest radiograph.  FINDINGS: A total of approximately 1 liter of hazy, yellow fluid was removed. Requested samples were sent to the laboratory.  IMPRESSION: Successful ultrasound-guided diagnostic and therapeutic right sided thoracentesis yielding 1 liter of pleural fluid.  Read by: Rowe Robert, PA-C   Electronically Signed   By: Lucrezia Europe M.D.   On: 10/29/2014 15:39     ASSESSMENT: 49 y.o. BRCA negative [place] woman  (1) an status post right breast upper outer quadrant and right axillary lymph node biopsy 04/04/2012, both positive for an invasive ductal carcinoma, grade 3, triple negative, with an MIB-1 of 25%  (2) Treated neoadjuvantly with  (a) fluorouracil, cyclophosphamide, and epirubicin (at 100 mg/M2) x4 completed 06/07/2012  (b) docetaxel (75 mg/M2) for one dose, 06/21/2012, poorly tolerated  (c) carboplatin and gemcitabine given every 21 days for 6 cycles completed 10/11/2012  (3) status post right modified radical mastectomy 11/18/2012 showing a complete pathologic response--all 16 lymph nodes were benign  (4) adjuvant radiation therapy completed 03/06/2013  (5) no plans for reconstructon  PLAN: Nicole Valentine's small right supraclavicular lymph node is persistent and there may be a second smaller lymph node coming up laterally to it. I think we may need to biopsy this but probably the first thing to do is to obtain a CT scan of the neck in case there are more accessible lesions or more disease than we are aware of. Based on the above results then we can refer her to her surgeon, Dr. Barry Dienes, for biopsy as appropriate.  Since she is having visual changes that I cannot readily explain (they may indeed be ocular migraines as suspected) I am going to add a head CT with contrast just to make sure there is not metastatic disease, which is of course but she is concerned about..  She is having a lot of sinus drainage, which might be the real problem, I have suggested she try  Claritin or Zyrtec.  She already has an appointment with me for late August. We will leave that in place. She will call me within a day or 2 of her CT scans to get results and we will continue to move the workup along so we will have whatever results develop by the time she sees me at the  next meeting. Chauncey Cruel, MD   11/01/2014 4:21 PM

## 2014-11-02 LAB — BODY FLUID CULTURE
Culture: NO GROWTH
Gram Stain: NONE SEEN

## 2014-11-04 NOTE — Telephone Encounter (Signed)
error 

## 2014-11-05 ENCOUNTER — Other Ambulatory Visit: Payer: Self-pay | Admitting: Radiology

## 2014-11-05 ENCOUNTER — Other Ambulatory Visit: Payer: Self-pay | Admitting: Physician Assistant

## 2014-11-06 ENCOUNTER — Encounter (HOSPITAL_COMMUNITY): Payer: Self-pay

## 2014-11-06 ENCOUNTER — Ambulatory Visit (HOSPITAL_COMMUNITY)
Admission: RE | Admit: 2014-11-06 | Discharge: 2014-11-06 | Disposition: A | Discharge status: Home / Self Care | Payer: BLUE CROSS/BLUE SHIELD | Source: Ambulatory Visit | Attending: Oncology | Admitting: Oncology

## 2014-11-06 ENCOUNTER — Ambulatory Visit (HOSPITAL_BASED_OUTPATIENT_CLINIC_OR_DEPARTMENT_OTHER): Payer: BLUE CROSS/BLUE SHIELD | Admitting: Oncology

## 2014-11-06 ENCOUNTER — Telehealth: Payer: Self-pay | Admitting: Oncology

## 2014-11-06 VITALS — BP 135/82 | HR 91 | Temp 98.0°F | Resp 18 | Ht 67.0 in | Wt 180.8 lb

## 2014-11-06 DIAGNOSIS — C782 Secondary malignant neoplasm of pleura: Secondary | ICD-10-CM

## 2014-11-06 DIAGNOSIS — J910 Malignant pleural effusion: Secondary | ICD-10-CM | POA: Diagnosis not present

## 2014-11-06 DIAGNOSIS — C50411 Malignant neoplasm of upper-outer quadrant of right female breast: Secondary | ICD-10-CM

## 2014-11-06 DIAGNOSIS — R221 Localized swelling, mass and lump, neck: Secondary | ICD-10-CM

## 2014-11-06 DIAGNOSIS — C778 Secondary and unspecified malignant neoplasm of lymph nodes of multiple regions: Secondary | ICD-10-CM | POA: Diagnosis not present

## 2014-11-06 DIAGNOSIS — Z853 Personal history of malignant neoplasm of breast: Secondary | ICD-10-CM | POA: Diagnosis not present

## 2014-11-06 DIAGNOSIS — C7971 Secondary malignant neoplasm of right adrenal gland: Secondary | ICD-10-CM | POA: Diagnosis not present

## 2014-11-06 DIAGNOSIS — C50919 Malignant neoplasm of unspecified site of unspecified female breast: Secondary | ICD-10-CM

## 2014-11-06 DIAGNOSIS — M899 Disorder of bone, unspecified: Secondary | ICD-10-CM

## 2014-11-06 DIAGNOSIS — C770 Secondary and unspecified malignant neoplasm of lymph nodes of head, face and neck: Secondary | ICD-10-CM | POA: Diagnosis not present

## 2014-11-06 DIAGNOSIS — R918 Other nonspecific abnormal finding of lung field: Secondary | ICD-10-CM

## 2014-11-06 DIAGNOSIS — I7090 Unspecified atherosclerosis: Secondary | ICD-10-CM | POA: Diagnosis not present

## 2014-11-06 DIAGNOSIS — Z9011 Acquired absence of right breast and nipple: Secondary | ICD-10-CM | POA: Insufficient documentation

## 2014-11-06 DIAGNOSIS — C7951 Secondary malignant neoplasm of bone: Secondary | ICD-10-CM | POA: Insufficient documentation

## 2014-11-06 DIAGNOSIS — C7800 Secondary malignant neoplasm of unspecified lung: Secondary | ICD-10-CM | POA: Diagnosis not present

## 2014-11-06 DIAGNOSIS — R590 Localized enlarged lymph nodes: Secondary | ICD-10-CM | POA: Diagnosis present

## 2014-11-06 DIAGNOSIS — K769 Liver disease, unspecified: Secondary | ICD-10-CM

## 2014-11-06 DIAGNOSIS — E279 Disorder of adrenal gland, unspecified: Secondary | ICD-10-CM

## 2014-11-06 DIAGNOSIS — C50911 Malignant neoplasm of unspecified site of right female breast: Secondary | ICD-10-CM

## 2014-11-06 DIAGNOSIS — Z171 Estrogen receptor negative status [ER-]: Secondary | ICD-10-CM

## 2014-11-06 LAB — PROTIME-INR
INR: 1.1 (ref 0.00–1.49)
Prothrombin Time: 14.4 seconds (ref 11.6–15.2)

## 2014-11-06 LAB — GLUCOSE, CAPILLARY: Glucose-Capillary: 107 mg/dL — ABNORMAL HIGH (ref 65–99)

## 2014-11-06 LAB — CBC
HEMATOCRIT: 42.3 % (ref 36.0–46.0)
Hemoglobin: 14.4 g/dL (ref 12.0–15.0)
MCH: 34.4 pg — ABNORMAL HIGH (ref 26.0–34.0)
MCHC: 34 g/dL (ref 30.0–36.0)
MCV: 101 fL — AB (ref 78.0–100.0)
Platelets: 183 10*3/uL (ref 150–400)
RBC: 4.19 MIL/uL (ref 3.87–5.11)
RDW: 12.9 % (ref 11.5–15.5)
WBC: 4.9 10*3/uL (ref 4.0–10.5)

## 2014-11-06 LAB — APTT: APTT: 32 s (ref 24–37)

## 2014-11-06 MED ORDER — FENTANYL CITRATE (PF) 100 MCG/2ML IJ SOLN
INTRAMUSCULAR | Status: AC
  Filled 2014-11-06: qty 2

## 2014-11-06 MED ORDER — FENTANYL CITRATE (PF) 100 MCG/2ML IJ SOLN
INTRAMUSCULAR | Status: AC | PRN
  Administered 2014-11-06: 25 ug via INTRAVENOUS
  Administered 2014-11-06: 50 ug via INTRAVENOUS

## 2014-11-06 MED ORDER — LIDOCAINE-PRILOCAINE 2.5-2.5 % EX CREA: TOPICAL_CREAM | CUTANEOUS | Status: DC

## 2014-11-06 MED ORDER — LORAZEPAM 0.5 MG PO TABS
0.5000 mg | ORAL_TABLET | Freq: Every evening | ORAL | Status: DC | PRN
Start: 2014-11-06 — End: 2014-12-21

## 2014-11-06 MED ORDER — MIDAZOLAM HCL 2 MG/2ML IJ SOLN
INTRAMUSCULAR | Status: AC
  Filled 2014-11-06: qty 4

## 2014-11-06 MED ORDER — FENTANYL CITRATE (PF) 100 MCG/2ML IJ SOLN
INTRAMUSCULAR | Status: AC
  Filled 2014-11-06: qty 4

## 2014-11-06 MED ORDER — FLUDEOXYGLUCOSE F - 18 (FDG) INJECTION
8.8700 | Freq: Once | INTRAVENOUS | Status: AC | PRN
  Administered 2014-11-06: 8.87 via INTRAVENOUS

## 2014-11-06 MED ORDER — ONDANSETRON HCL 8 MG PO TABS: 8.0000 mg | ORAL_TABLET | Freq: Two times a day (BID) | ORAL | Status: DC

## 2014-11-06 MED ORDER — MIDAZOLAM HCL 2 MG/2ML IJ SOLN
INTRAMUSCULAR | Status: DC
Start: 2014-11-06 — End: 2014-11-07
  Filled 2014-11-06: qty 2

## 2014-11-06 MED ORDER — SODIUM CHLORIDE 0.9 % IV SOLN
INTRAVENOUS | Status: DC
  Administered 2014-11-06: 11:00:00 via INTRAVENOUS

## 2014-11-06 MED ORDER — MIDAZOLAM HCL 2 MG/2ML IJ SOLN
INTRAMUSCULAR | Status: AC | PRN
  Administered 2014-11-06: 0.5 mg via INTRAVENOUS
  Administered 2014-11-06: 1 mg via INTRAVENOUS

## 2014-11-06 MED ORDER — PROCHLORPERAZINE MALEATE 10 MG PO TABS: 10.0000 mg | ORAL_TABLET | Freq: Four times a day (QID) | ORAL | Status: DC | PRN

## 2014-11-06 NOTE — Progress Notes (Signed)
Spoke with patient 1830 - Advised lab at 130 and infusion at 200 on Monday 6/20.  Ativan prescription called in to [facility].  Pt voiced understanding.   Lab appt made

## 2014-11-06 NOTE — Procedures (Signed)
Interventional Radiology Procedure Note  Procedure: US guided biopsy of right cervical lymph node/supraclavicular lymph node  3 x 18 g core.  Complications: None Recommendations:  - Ok to shower tomorrow - Do not submerge for 7 days - Routine care   Signed,  Dulcy Fanny. Earleen Newport, DO

## 2014-11-06 NOTE — Progress Notes (Signed)
And a Patient ID: Nicole Valentine, female   DOB: [birth year], 49 y.o.   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 She had a mammogram performed that showed dense breasts bilaterally. Ultrasound of the right breast showed 2.3 x 1.9 cm area of abnormality with multiple abnormal lymph nodes. MRI of the bilateral breasts showed asymmetrical enhancement throughout the right breast consistent with multicentric disease. An ultrasound-guided biopsy performed. The known area of disease measured 1.9 x 1.9 x 2.3 cm. Multiple abnormal positive right axillary lymph nodes were noted. 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 Biopsy of the right axillary lymph node was positive for malignancy with extracapsular extension. Patient was originally seen by Dr. [name] Dr. [name] and Dr. [name]. She has elected to have a right mastectomy eventually and declined biopsies of any other areas within the breast."  She went on to receive neoadjuvant chemotherapy and attained a complete pathologic response, as documented below  [place] noted a very small right supraclavicular mass 09/25/2014, which she brought to our attention. She was having some allergy symptoms at the time so we decided to reevaluate this after a few weeks and on 10/22/2014 as the mass persisted we obtained a restaging neck CT scan, 10/28/2014. There was bilateral supraclavicular adenopathy, but also a large right pleural effusion was incidentally noted. We proceeded to right thoracentesis 10/29/2014, and a liter of  hazy yellow fluid was removed. Cytology from this procedure ([id number]) showed malignant cells consistent with adenocarcinoma, estrogen and progesterone receptor negative, HER-2 not amplified, with a signals ratio 1.12 and the number per cell being 2.75, and with an MIB-1 of 80%. I called Nicole Valentine with these results and set her up for a PET scan performed 11/06/2014, which shows widespread metastatic disease involving particularly the right lung, liver, and bones, but also the left lung, right adrenal, and multiple lymph node areas.  Her subsequent history is as detailed below.  INTERVAL HISTORY: Nicole Valentine returns to the breast clinic today accompanied by her husband [name] to discuss results of her newly diagnosed metastatic disease.  REVIEW OF S/YSTEMS: Considering the extent of her metastatic disease, Adalena is having very few symptoms. She still has significant post nasal drainage. This causes her a dry cough at times. There has been no purulent sputum, hemoptysis, or pleurisy. She notes mild shortness of breath when walking particularly when going up stairs. She denies pain, fever, rash, unexplained weight loss, or unexplained fatigue.[name] She was having some visual changes, which reevaluated with a head CT 10/28/2014, which was unremarkable. She tolerated the thoracentesis without any unusual complications. A detailed review of systems today was otherwise noncontributory  PAST MEDICAL HISTORY: Past Medical History  Diagnosis Date  . Complication of anesthesia     her father and uncle both had very difficult time waking up-was  something they told them may be hereditory. she will find out.  [name] Hypothyroidism   . 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 Radiation 01/21/13-03/06/13    Right Breast  . Breast cancer dx'd 04-10-12-rt    PAST SURGICAL HISTORY: Past Surgical History  Procedure Laterality Date  . Wisdom tooth extraction    . Portacath placement  04/22/2012    Procedure: INSERTION  PORT-A-CATH;  Surgeon: [name], MD;  Location: [facility];  Service: General;  Laterality: Left;  [name] Modified mastectomy Right 11/18/2012    Procedure:  RIGHT MODIFIED MASTECTOMY;  Surgeon: [name], MD;  Location: [facility];  Service: General;  Laterality: Right;  . Knee arthroscopy Right 11/26/2013  . Port-a-cath removal Left 12/10/2013    Procedure: MINOR REMOVAL PORT-A-CATH;  Surgeon: [name], MD;  Location: [facility];  Service: General;  Laterality: Left;    FAMILY HISTORY Family History  Problem Relation Age of Onset  . Lung cancer Maternal Grandfather 96    smoker  . Prostate cancer Maternal Grandfather 90  . Throat cancer Other     Great Aunt x 2  . Liver cancer Other     Maternal Great Grandmother  . Melanoma Maternal Uncle 81  . Brain cancer Cousin 11    non-malignant  the patient's parents are living, in their early 28s. The patient has 2 brothers, no sisters. The patient's mother was diagnosed with breast cancer, HER-2 positive, in 2014 in [place].  GYNECOLOGIC HISTORY:   (Updated 10/03/2013) Menarche age 41, first live birth age 7. She is GX P4. LMP January 2014. 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  The children are currently ages 59, 66, 80, and 57. Her husband, [name], is a Customer service manager. They attend the [employer]: Not in place   HEALTH MAINTENANCE:  (Updated 10/03/2013) History  Substance Use Topics  . Smoking status: Never Smoker   . Smokeless tobacco: Never Used  . Alcohol Use: No     Colonoscopy: Never  PAP: November 2013/Dr. Smith  Bone density: January 2015, [facility], normal  Lipid panel:  Not on file  Allergies  Allergen Reactions  . Almond Meal Rash  . Cashew Nut Oil Rash  . Mango Flavor Rash  . Other Rash    LOTIONS    Current Outpatient  Prescriptions  Medication Sig Dispense Refill  . Ascorbic Acid (VITAMIN C PO) Take 1 tablet by mouth daily.    . cholecalciferol (VITAMIN D) 1000 UNITS tablet Take 1,000 Units by mouth daily.    [name] levothyroxine (SYNTHROID, LEVOTHROID) 150 MCG tablet Take 175 mcg by mouth daily before breakfast.    . lidocaine-prilocaine (EMLA) cream Apply to affected area once 30 g 3  . LORazepam (ATIVAN) 0.5 MG tablet Take 1 tablet (0.5 mg total) by mouth at bedtime as needed (Nausea or vomiting). 30 tablet 0  . ondansetron (ZOFRAN) 8 MG tablet Take 1 tablet (8 mg total) by mouth 2 (two) times daily. Start the day after chemo for 2 days. Then take as needed for nausea or vomiting. 30 tablet 1  . prochlorperazine (COMPAZINE) 10 MG tablet Take 1 tablet (10 mg total) by mouth every 6 (six) hours as needed (Nausea or vomiting). 30 tablet 1  . Pseudoeph-Doxylamine-DM-APAP (NYQUIL PO) Take by mouth.     No current facility-administered medications for this visit.      OBJECTIVE: Middle-aged white woman who appears stated age 77 Vitals:   11/06/14 1509  BP: 135/82  Pulse: 91  Temp: 98 F (36.7 C)  Resp: 18     Body mass index is 28.[place]  kg/(m^2).    ECOG FS:1 - Symptomatic but completely ambulatory Filed Weights   11/06/14 1509  Weight: 180 lb 12.8 oz (82.01 kg)   Sclerae unicteric, EOMs intact Oropharynx clear, dentition in good repair Right supraclavicular adenopathy as previously noted Lungs decreased breath sounds right base Heart regular rate and rhythm Abd soft, nontender, positive bowel sounds MSK no focal spinal tenderness, no upper extremity lymphedema Neuro: nonfocal, well oriented, appropriate affect Breasts: deferred    LAB RESULTS:     Lab Results  Component Value Date   WBC 4.9 11/06/2014   NEUTROABS 3.2 07/08/2014   HGB 14.4 11/06/2014   HCT 42.3 11/06/2014   MCV 101.0* 11/06/2014   PLT 183 11/06/2014      Chemistry      Component Value Date/Time   NA 142 07/08/2014  1205   NA 142 11/15/2012 1215   K 4.1 07/08/2014 1205   K 3.7 11/15/2012 1215   CL 104 11/15/2012 1215   CL 101 10/18/2012 0859   CO2 26 07/08/2014 1205   CO2 30 11/15/2012 1215   BUN 14.6 07/08/2014 1205   BUN 8 11/15/2012 1215   CREATININE 0.8 07/08/2014 1205   CREATININE 0.67 11/15/2012 1215      Component Value Date/Time   CALCIUM 9.4 07/08/2014 1205   CALCIUM 9.3 11/15/2012 1215   ALKPHOS 105 07/08/2014 1205   AST 31 07/08/2014 1205   ALT 23 07/08/2014 1205   BILITOT 0.60 07/08/2014 1205       STUDIES: Dg Chest 1 View  10/29/2014   CLINICAL DATA:  Post thoracentesis chest radiograph.  Cough.  EXAM: CHEST  1 VIEW  COMPARISON:  CT 10/28/2014.  FINDINGS: No pneumothorax. Small RIGHT pleural effusion with consolidation at the RIGHT base, probably representing atelectatic lung in the setting of previously seen large effusion. No LEFT pleural effusion. RIGHT axillary dissection clips present.  IMPRESSION: 1. No pneumothorax status post RIGHT thoracentesis. 2. Residual collapse/consolidation of the RIGHT base.   Electronically Signed   By: Dereck Ligas M.D.   On: 10/29/2014 15:38   Ct Head W Wo Contrast  10/28/2014   CLINICAL DATA:  Breast cancer.  Visual changes.  EXAM: CT HEAD WITHOUT AND WITH CONTRAST  TECHNIQUE: Contiguous axial images were obtained from the base of the skull through the vertex without and with intravenous contrast  CONTRAST:  196m OMNIPAQUE IOHEXOL 300 MG/ML  SOLN  COMPARISON:  None.  FINDINGS: Ventricle size normal. Negative for acute or chronic ischemia. Negative for mass or edema. Negative for hemorrhage  Normal enhancement following contrast infusion.  Skull is normal.  IMPRESSION: Negative   Electronically Signed   By: CFranchot GalloM.D.   On: 10/28/2014 15:33   Ct Soft Tissue Neck W Contrast  10/28/2014   CLINICAL DATA:  Breast cancer. Small palpable lymph nodes right supraclavicular region.  EXAM: CT NECK WITH CONTRAST  TECHNIQUE: Multidetector CT imaging  of the neck was performed using the standard protocol following the bolus administration of intravenous contrast.  CONTRAST:  1044mOMNIPAQUE IOHEXOL 300 MG/ML  SOLN  COMPARISON:  PET-CT 04/19/2012  FINDINGS: Pharynx and larynx: Negative. Well-defined density lateral to the right molars consistent with a lozenge or tablet. Negative larynx.  Salivary glands: Negative  Thyroid: Small thyroid gland without mass lesion.  Lymph nodes: Right supraclavicular lymph nodes are enlarged with heterogeneous enhancement compatible with metastatic disease. Multiple lymph nodes are seen around the right jugular vein measuring up to 12 mm. Right supraclavicular node 15  mm.  Left supraclavicular lymph node measures 9 x 20 mm and is most consistent with metastatic disease. Left posterior superior axillary node measures 11 mm and appears to represent a pathologic node. Left level 2 node is 5 mm and is indeterminate. Small posterior lymph nodes are present on the left and are indeterminate. Right level 2 node measures 6 mm and is indeterminate.  Vascular: Jugular vein patent bilaterally. Carotid artery patent bilaterally.  Limited intracranial: Negative  Visualized orbits: Negative  Mastoids and visualized paranasal sinuses: Negative  Skeleton: Disc degeneration and spondylosis C5-6. No bony lesion identified.  Upper chest: Large right pleural effusion. Atelectasis in the right upper lobe.  IMPRESSION: Bilateral supraclavicular adenopathy compatible metastatic disease. Small level 2 and level 5 nodes are present bilaterally which could be due to metastatic disease as well.  Large right pleural effusion, worrisome for malignancy. PET is recommended for further evaluation.   Electronically Signed   By: Franchot Gallo M.D.   On: 10/28/2014 15:48   Nm Pet Image Restag (ps) Skull Base To Thigh  11/06/2014   CLINICAL DATA:  Subsequent treatment strategy for breast cancer. Restaging examination. History of right-sided breast cancer status  post right-sided modified radical mastectomy 11/18/2012.  EXAM: NUCLEAR MEDICINE PET SKULL BASE TO THIGH  TECHNIQUE: 8.8 mCi F-18 FDG was injected intravenously. Full-ring PET imaging was performed from the skull base to thigh after the radiotracer. CT data was obtained and used for attenuation correction and anatomic localization.  FASTING BLOOD GLUCOSE:  Value: 107 mg/dl  COMPARISON:  Chest CT 04/20/2014.  PET-CT 04/19/2012.  FINDINGS: NECK  Multiple borderline and mildly enlarged low cervical and supraclavicular lymph nodes which are markedly hypermetabolic bilaterally (right greater than left).  CHEST  Innumerable borderline enlarged and mildly enlarged hypermetabolic mediastinal and bilateral hilar lymph nodes are hypermetabolic. The right hilar nodal tissue is contiguous with parenchymal mass-like opacities involving the right lower lobe and to a lesser extent the right middle lobe. There are several air bronchograms throughout this region, however, given the general mass-like appearance of this area and the associated profound septal thickening which extends into the surrounding lung parenchyma and is somewhat nodular in appearance, this entire area is favored to be predominantly related to lymphangitic spread of tumor (in addition to some associated postobstructive changes), and this region is diffusely hypermetabolic (SUVmax = 40.8). Associated with this is a moderate right-sided pleural effusion, and there is some associated pleural hypermetabolism in the base of the right hemithorax (SUVmax = 10.0) indicative of a malignant pleural effusion. Multiple pulmonary nodules are noted in the lungs bilaterally, which demonstrate hypermetabolism, compatible with additional metastatic foci.  ABDOMEN/PELVIS  Several areas of hypermetabolism are noted in the liver, but are not readily detectable on the CT images. The largest focus of hypermetabolism is in the superior aspect of segment 8 (SUVmax = 6.3). Gallbladder,  pancreas, spleen, left adrenal gland and bilateral kidneys are unremarkable in appearance. There is a small focus of hypermetabolism in the right adrenal gland (SUVmax = 10.3). In addition, there are several borderline enlarged upper abdominal lymph nodes, most evident in the celiac axis which are hypermetabolic (SUVmax = 1.4-4.8). Mild atherosclerosis throughout the abdominal and pelvic vasculature. No significant volume of ascites. No pneumoperitoneum. No pathologic distention of small bowel or colon. Uterus and ovaries are unremarkable in appearance. Status post right modified radical mastectomy and axillary lymph node dissection.  SKELETON  Multifocal skeletal hypermetabolism, compatible with widespread metastatic disease to the bones. Some of the  larger lesions include diffuse hypermetabolism and subtle sclerosis in T9 vertebral body (SUVmax = 15.4), a subtly sclerotic hypermetabolic lesion in the inferior aspect of the sternum (SUVmax = 13.4), multiple rib lesions, including the posterior aspect of the left seventh rib, small hypermetabolic focus in the periphery of the left humeral head, and multiple sclerotic lesions in the ileum bilaterally, largest of which is on the right measuring approximately 1.9 cm (SUVmax = 19.0).  IMPRESSION: 1. Today's study demonstrates widespread metastatic disease throughout the neck, chest, abdomen and pelvis, with widespread pulmonary metastases, including a large volume of lymphangitic spread of disease in the right middle and lower lobes, a malignant right pleural effusion, multiple hepatic lesions, a right adrenal metastasis, numerous osseous lesions, and widespread lymphadenopathy, as detailed above. These results were discussed by telephone at the time of interpretation on 11/06/2014 at 3:10 pm to Dr. Lurline Del, who verbally acknowledged these results.   Electronically Signed   By: Vinnie Langton M.D.   On: 11/06/2014 15:29   US Biopsy  11/06/2014   CLINICAL  DATA:  49 year old female with a history of breast carcinoma, now with possible recurrence. She has been referred for percutaneous biopsy of cervical lymph node.  EXAM: ULTRASOUND GUIDED CORE BIOPSY OF CERVICAL LYMPH NODE  MEDICATIONS: 1.5 mg IV Versed; 75 mcg IV Fentanyl  Total Moderate Sedation Time: 14  PROCEDURE: The procedure, risks, benefits, and alternatives were explained to the patient. Questions regarding the procedure were encouraged and answered. The patient understands and consents to the procedure.  Ultrasound survey was performed with images stored and sent to PACs.  The right neck was prepped with Betadine in a sterile fashion, and a sterile drape was applied covering the operative field. A sterile gown and sterile gloves were used for the procedure. Local anesthesia was provided with 1% Lidocaine.  Ultrasound guidance was then used to infiltrate the skin and subcutaneous tissues with 1% lidocaine, and then a small stab incision was made at the base of the right neck with an 11 blade scalpel. A biopsy gun (18 gauge) was then advanced into the pathologic note at the base of the right neck, with 3 subsequent core biopsy achieved under ultrasound guidance.  The patient tolerated the procedure well and remained hemodynamically stable throughout.  No complications were encountered and no significant blood loss was encountered.  Small bandage was placed after the procedure.  Samples were placed into formalin for transportation to the lab.  Final image was stored after biopsy.  Patient tolerated the procedure well and remained hemodynamically stable throughout.  COMPLICATIONS: None.  FINDINGS: Ultrasound survey demonstrates multiple abnormal appearing lymph nodes/nodules within the right supraclavicular region and cervical region.  Images during the case demonstrate placement of biopsy gun into right cervical/supraclavicular lymph node.  Final image demonstrates no complicating features.  IMPRESSION: Status  post ultrasound-guided biopsy of right-sided cervical/ supraclavicular lymph nodes, with tissue specimen sent to pathology for complete histopathologic analysis.  Signed,  Dulcy Fanny. Earleen Newport, DO  [facility]  [facility]   Electronically Signed   By: Corrie Mckusick D.O.   On: 11/06/2014 14:23   US Thoracentesis Asp Pleural Space W/img Guide  10/29/2014   INDICATION: Patient with history of right breast cancer; now with some dyspnea with exertion and right pleural effusion. Request is made for diagnostic and therapeutic right thoracentesis.  EXAM: ULTRASOUND GUIDED DIAGNOSTIC AND THERAPEUTIC RIGHT THORACENTESIS  COMPARISON:  None.  MEDICATIONS: None  COMPLICATIONS: None immediate  TECHNIQUE:  Informed written consent was obtained from the patient after a discussion of the risks, benefits and alternatives to treatment. A timeout was performed prior to the initiation of the procedure.  Initial ultrasound scanning demonstrates a moderate to large right pleural effusion. The lower chest was prepped and draped in the usual sterile fashion. 1% lidocaine was used for local anesthesia.  An ultrasound image was saved for documentation purposes. A 6 Fr Safe-T-Centesis catheter was introduced. The thoracentesis was performed. The catheter was removed and a dressing was applied. The patient tolerated the procedure well without immediate post procedural complication. The patient was escorted to have an upright chest radiograph.  FINDINGS: A total of approximately 1 liter of hazy, yellow fluid was removed. Requested samples were sent to the laboratory.  IMPRESSION: Successful ultrasound-guided diagnostic and therapeutic right sided thoracentesis yielding 1 liter of pleural fluid.  Read by: Rowe Robert, PA-C   Electronically Signed   By: Lucrezia Europe M.D.   On: 10/29/2014 15:39    ASSESSMENT: 49 y.o. BRCA negative [place] woman with triple-negative stage IV breast cancer  (1) an  status post right breast upper outer quadrant and right axillary lymph node biopsy 04/04/2012, both positive for an invasive ductal carcinoma, grade 3, triple negative, with an MIB-1 of 25%  (2) Treated neoadjuvantly with  (a) fluorouracil, cyclophosphamide, and epirubicin (at 100 mg/M2) x4 completed 06/07/2012  (b) docetaxel (75 mg/M2) for one dose, 06/21/2012, poorly tolerated  (c) carboplatin and gemcitabine given every 21 days for 6 cycles completed 10/11/2012  (3) status post right modified radical mastectomy 11/18/2012 showing a complete pathologic response--all 16 lymph nodes were benign  (a) no plans for reconstructon  (4) adjuvant radiation therapy completed 03/06/2013  METASTATIC DISEASE June 2016 (5) 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, 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  (6) staging PET scan 11/06/2014 showed significant disease in the middle and lower lobes of the Right lung, Right effusion, multiple liver and bone lesions, as well as left lung nodules, a Right adrenal nodule, and widespread hypermetabolic adenopathy  (7) abraxane day 1 and day 8 of each 21 day cycle to start 11/09/2014  PLAN: I spent approximately 50 minutes today with [name] and her husband going over her situation. It is remarkable how few symptoms she had given how widespread her metastatic disease now appears. We discussed the fact that stage IV breast cancer is not curable with our current knowledge base. The goal of treatment is control. The strategy of treatment is to do only the minimum necessary to control the growth of the tumor so that the patient can have as normal a life as possible. There is no survival advantage in treating aggressively if treating less aggressively results in tumor control. With this strategy stage IV breast cancer in some cases can function as a "chronic illness": something that cannot be quite gotten rid of but can be controlled for an indefinite  period of time  There are always 3 questions to go over and so we reviewed those today. The first question is do we treat or not. In some cases the patient's overall situation is so discouraging that palliative/comfort care alone is appropriate. If the decision is made to treat, then the next question is whether more or less aggressive treatment is appropriate. Once that decision is made than the third question is: Which agent or combination of agents in particular should be used?  Going over the chemotherapy Kristia has had before, which means that her cancer  was able to survive it, notably she has had very little in the way of taxanes. She only had one cycle of docetaxel, which she tolerated poorly. I think a good initial approach in her case would be Abraxane, which has a good chance of working as a single agent in a very good side effect profile. We did discuss the possible toxicities, side effects and complications of this agent and we have obtained a precertification for her to start 11/09/2014. The plan will be for 3 cycles (6 doses) after which we will restage, but of course we have palpable disease in the right supraclavicular area which we can follow up as well.  She will need a brain MRI to complete her staging, and if we have brain metastases and she will be referred to radiation oncology. She will need a port, and this is also being arranged. She will benefit from zolendronate and I will add that to her day 8 treatment of cycle 1 of Abraxane. She has excellent dental care and is aware of the possible problems of hypocalcemia, bony aches and pains, and the rare case of osteonecrosis of the jaw associated with bisphosphonates.  We also discussed kids Path. I will give her more specific information regarding that when she returns to see me June 24.  Finally if the brain MRI proves negative, we will initiate consultation with [facility] as she would be I think a good candidate for one of their studies  if and when the Abraxane does not produce a response. She can also sign up for [facility] at that time.  Julieana is very stoical. She and her husband are already making appropriate plans to make the best of what time she has. She is going to see me again next week but knows to call for any problems that may develop before that visit.   Chauncey Cruel, MD   11/07/2014 9:29 AM

## 2014-11-06 NOTE — Discharge Instructions (Signed)
° ° °  Sentinel Lymph Node Biopsy Care After Refer to this sheet in the next few weeks. These instructions provide you with information on caring for yourself after your procedure. Your caregiver may also give you more specific instructions. Your treatment has been planned according to current medical practices, but problems sometimes occur. Call your caregiver if you have any problems or questions after your procedure. HOME CARE INSTRUCTIONS  Avoid vigorous exercise. Ask your caregiver when you can return to your normal activities.  You may shower 24 hours after your procedure. It is okay to get your surgical cut (incision) wet. Gently pat the incision dry after you shower.  If you are given a surgical bra, wear it for the next 48 hours. You may remove the bra to shower.  Keep all follow-up appointments as directed by your caregiver.  If you have skin adhesive strips over the incision, do not remove them. They will fall off on their own over time.  Only take over-the-counter or prescription medicines for pain, fever, or discomfort as directed by your caregiver.  You may resume your regular diet. SEEK IMMEDIATE MEDICAL CARE IF:   Your pain is not controlled with medicine.  You notice redness, swelling, or increased fluid draining from the incision.  You feel nauseous or vomit. MAKE SURE YOU:  Understand these instructions.  Will watch your condition.  Will get help right away if you are not doing well or get worse. Document Released: 12/21/2003 Document Revised: 07/31/2011 Document Reviewed: 06/27/2013 [facility] Patient Information 2015 [place], [place]. This information is not intended to replace advice given to you by your health care provider. Make sure you discuss any questions you have with your health care provider.

## 2014-11-06 NOTE — Telephone Encounter (Signed)
Gave avs & calendar for June through Aug.

## 2014-11-06 NOTE — H&P (Signed)
   HPI: 49 year old female with right breast cancer and new lymphadenopathy. She has been seen by Dr. [name] on 11/01/14. PET done today has been reviewed by Dr. Earleen Newport and she is scheduled today for US guided right cervical lymph node biopsy.  The patient has had a H&P performed within the last 30 days, all history, medications, and exam have been reviewed. The patient denies any interval changes since the H&P.  Vital Signs: BP 144/94 mmHg  Pulse 85  Temp(Src) 98.3 F (36.8 C) (Oral)  Resp 20  SpO2 97%  Physical Exam  Constitutional: She is oriented to person, place, and time. No distress.  HENT:  Head: Normocephalic and atraumatic.  Cardiovascular: Normal rate and regular rhythm.  Exam reveals no gallop and no friction rub.   No murmur heard. Pulmonary/Chest: Effort normal and breath sounds normal. No respiratory distress. She has no wheezes. She has no rales.  Abdominal: Soft. Bowel sounds are normal.  Lymphadenopathy:    She has cervical adenopathy.  Neurological: She is alert and oriented to person, place, and time.  Skin: Skin is warm and dry. She is not diaphoretic.    Mallampati Score:  MD Evaluation Airway: WNL Heart: WNL Abdomen: WNL Chest/ Lungs: WNL ASA  Classification: 3 Mallampati/Airway Score: One  Labs:  CBC:  Recent Labs  01/02/14 1551 03/30/14 1419 07/08/14 1205 11/06/14 1118  WBC 4.6 5.0 4.8 4.9  HGB 13.7 13.8 14.9 14.4  HCT 39.8 41.0 44.5 42.3  PLT 153 157 157 183    COAGS:  Recent Labs  11/06/14 1118  INR 1.10  APTT 32    BMP:  Recent Labs  01/02/14 1551 03/30/14 1420 07/08/14 1205  NA 143 141 142  K 4.0 3.5 4.1  CO2 29 29 26   GLUCOSE 92 102 87  BUN 13.1 13.4 14.6  CALCIUM 9.3 9.0 9.4  CREATININE 0.8 0.7 0.8    LIVER FUNCTION TESTS:  Recent Labs  01/02/14 1551 03/30/14 1420 07/08/14 1205  BILITOT 0.46 0.48 0.60  AST 18 21 31   ALT 11 21 23   ALKPHOS 110 124 105  PROT 6.9 6.8 7.2  ALBUMIN 3.7 3.7 3.9     Assessment/Plan:  Right breast cancer New lymphadenopathy Seen by Dr. [name] 11/01/14 PET done today has been reviewed by Dr. Earleen Newport Scheduled today for US guided right cervical lymph node biopsy with sedation The patient has been NPO, no blood thinners taken, labs and vitals have been reviewed. Risks and Benefits discussed with the patient including, but not limited to bleeding, infection, damage to adjacent structures or low yield requiring additional tests. All of the patient's questions were answered, patient is agreeable to proceed. Consent signed and in chart.   SignedHedy Jacob 11/06/2014, 12:50 PM

## 2014-11-07 DIAGNOSIS — C50919 Malignant neoplasm of unspecified site of unspecified female breast: Secondary | ICD-10-CM | POA: Insufficient documentation

## 2014-11-07 DIAGNOSIS — C7951 Secondary malignant neoplasm of bone: Secondary | ICD-10-CM

## 2014-11-09 ENCOUNTER — Other Ambulatory Visit: Payer: Self-pay | Admitting: Oncology

## 2014-11-09 ENCOUNTER — Other Ambulatory Visit: Payer: Self-pay | Admitting: *Deleted

## 2014-11-09 ENCOUNTER — Ambulatory Visit (HOSPITAL_BASED_OUTPATIENT_CLINIC_OR_DEPARTMENT_OTHER): Payer: BLUE CROSS/BLUE SHIELD

## 2014-11-09 ENCOUNTER — Other Ambulatory Visit (HOSPITAL_BASED_OUTPATIENT_CLINIC_OR_DEPARTMENT_OTHER): Payer: BLUE CROSS/BLUE SHIELD

## 2014-11-09 VITALS — BP 134/85 | HR 90 | Temp 99.6°F | Resp 18

## 2014-11-09 DIAGNOSIS — C778 Secondary and unspecified malignant neoplasm of lymph nodes of multiple regions: Secondary | ICD-10-CM | POA: Diagnosis not present

## 2014-11-09 DIAGNOSIS — C50411 Malignant neoplasm of upper-outer quadrant of right female breast: Secondary | ICD-10-CM

## 2014-11-09 DIAGNOSIS — Z5111 Encounter for antineoplastic chemotherapy: Secondary | ICD-10-CM | POA: Diagnosis not present

## 2014-11-09 LAB — COMPREHENSIVE METABOLIC PANEL (CC13)
ALBUMIN: 3.3 g/dL — AB (ref 3.5–5.0)
ALT: 21 U/L (ref 0–55)
ANION GAP: 9 meq/L (ref 3–11)
AST: 17 U/L (ref 5–34)
Alkaline Phosphatase: 149 U/L (ref 40–150)
BUN: 8 mg/dL (ref 7.0–26.0)
CALCIUM: 9.4 mg/dL (ref 8.4–10.4)
CO2: 28 meq/L (ref 22–29)
Chloride: 104 mEq/L (ref 98–109)
Creatinine: 0.7 mg/dL (ref 0.6–1.1)
Glucose: 106 mg/dl (ref 70–140)
POTASSIUM: 3.8 meq/L (ref 3.5–5.1)
SODIUM: 141 meq/L (ref 136–145)
TOTAL PROTEIN: 6.7 g/dL (ref 6.4–8.3)
Total Bilirubin: 0.7 mg/dL (ref 0.20–1.20)

## 2014-11-09 LAB — CBC WITH DIFFERENTIAL/PLATELET
BASO%: 0.4 % (ref 0.0–2.0)
Basophils Absolute: 0 10*3/uL (ref 0.0–0.1)
EOS%: 0.8 % (ref 0.0–7.0)
Eosinophils Absolute: 0 10*3/uL (ref 0.0–0.5)
HEMATOCRIT: 42.5 % (ref 34.8–46.6)
HEMOGLOBIN: 14.4 g/dL (ref 11.6–15.9)
LYMPH%: 14.4 % (ref 14.0–49.7)
MCH: 34.3 pg — ABNORMAL HIGH (ref 25.1–34.0)
MCHC: 34 g/dL (ref 31.5–36.0)
MCV: 100.9 fL (ref 79.5–101.0)
MONO#: 0.3 10*3/uL (ref 0.1–0.9)
MONO%: 5.9 % (ref 0.0–14.0)
NEUT#: 4 10*3/uL (ref 1.5–6.5)
NEUT%: 78.5 % — ABNORMAL HIGH (ref 38.4–76.8)
Platelets: 215 10*3/uL (ref 145–400)
RBC: 4.21 10*6/uL (ref 3.70–5.45)
RDW: 12.8 % (ref 11.2–14.5)
WBC: 5.1 10*3/uL (ref 3.9–10.3)
lymph#: 0.7 10*3/uL — ABNORMAL LOW (ref 0.9–3.3)

## 2014-11-09 MED ORDER — PACLITAXEL PROTEIN-BOUND CHEMO INJECTION 100 MG
100.0000 mg/m2 | Freq: Once | INTRAVENOUS | Status: AC
  Administered 2014-11-09: 200 mg via INTRAVENOUS
  Filled 2014-11-09: qty 40

## 2014-11-09 MED ORDER — SODIUM CHLORIDE 0.9 % IV SOLN
Freq: Once | INTRAVENOUS | Status: AC
  Administered 2014-11-09: 15:00:00 via INTRAVENOUS

## 2014-11-09 MED ORDER — AZITHROMYCIN 250 MG PO TABS: ORAL_TABLET | ORAL | Status: DC

## 2014-11-09 MED ORDER — SODIUM CHLORIDE 0.9 % IV SOLN
Freq: Once | INTRAVENOUS | Status: AC
  Administered 2014-11-09: 15:00:00 via INTRAVENOUS
  Filled 2014-11-09: qty 4

## 2014-11-09 NOTE — Progress Notes (Signed)
Patient reports having "drainage" with cough producing clear phlegm.  States this has been an ongoing problem since April and continues despite taking off-brand of allegra at home.  Patient's temp up slightly today at 99.6.  Pt reports having fluid on her right lung and states this was drained approximately one week ago and is questioning whether this needs to be drained off again.  Pt states she has some dyspnea with exertion and if she takes a deep breath it causes her to cough. Pt also states she is supposed to have an MRI scheduled this week but have not heard from anyone regarding that appointment time.  Pt reports she is getting a PAC placed on Thursday 6/23.  Val, Dr. Virgie Dad RN, notified of pt's concerns. Okay to proceed with treatment today as scheduled.  Val, RN came to infusion to discuss these concerns with patient and schedule appointments. Pt states all concerns were addressed at this time.

## 2014-11-09 NOTE — Patient Instructions (Addendum)
[facility] Discharge Instructions for Patients Receiving Chemotherapy  Today you received the following chemotherapy agents: Abraxane.   To help prevent nausea and vomiting after your treatment, we encourage you to take your nausea medication as directed.    If you develop nausea and vomiting that is not controlled by your nausea medication, call the clinic.   BELOW ARE SYMPTOMS THAT SHOULD BE REPORTED IMMEDIATELY:  *FEVER GREATER THAN 100.5 F  *CHILLS WITH OR WITHOUT FEVER  NAUSEA AND VOMITING THAT IS NOT CONTROLLED WITH YOUR NAUSEA MEDICATION  *UNUSUAL SHORTNESS OF BREATH  *UNUSUAL BRUISING OR BLEEDING  TENDERNESS IN MOUTH AND THROAT WITH OR WITHOUT PRESENCE OF ULCERS  *URINARY PROBLEMS  *BOWEL PROBLEMS  UNUSUAL RASH Items with * indicate a potential emergency and should be followed up as soon as possible.  Feel free to call the clinic you have any questions or concerns. The clinic phone number is [phone].  Please show the [facility] at check-in to the Emergency Department and triage nurse.    Nanoparticle Albumin-Bound Paclitaxel injection What is this medicine? NANOPARTICLE ALBUMIN-BOUND PACLITAXEL (Na no PAHR ti kuhl al BYOO muhn-bound PAK li TAX el) is a chemotherapy drug. It targets fast dividing cells, like cancer cells, and causes these cells to die. This medicine is used to treat advanced breast cancer and advanced lung cancer. This medicine may be used for other purposes; ask your health care provider or pharmacist if you have questions. COMMON BRAND NAME(S): Abraxane What should I tell my health care provider before I take this medicine? They need to know if you have any of these conditions: -kidney disease -liver disease -low blood counts, like low platelets, red blood cells, or white blood cells -recent or ongoing radiation therapy -an unusual or allergic reaction to paclitaxel, albumin, other chemotherapy, other medicines,  foods, dyes, or preservatives -pregnant or trying to get pregnant -breast-feeding How should I use this medicine? This drug is given as an infusion into a vein. It is administered in a hospital or clinic by a specially trained health care professional. Talk to your pediatrician regarding the use of this medicine in children. Special care may be needed. Overdosage: If you think you have taken too much of this medicine contact a poison control center or emergency room at once. NOTE: This medicine is only for you. Do not share this medicine with others. What if I miss a dose? It is important not to miss your dose. Call your doctor or health care professional if you are unable to keep an appointment. What may interact with this medicine? -cyclosporine -diazepam -ketoconazole -medicines to increase blood counts like filgrastim, pegfilgrastim, sargramostim -other chemotherapy drugs like cisplatin, doxorubicin, epirubicin, etoposide, teniposide, vincristine -quinidine -testosterone -vaccines -verapamil Talk to your doctor or health care professional before taking any of these medicines: -acetaminophen -aspirin -ibuprofen -ketoprofen -naproxen This list may not describe all possible interactions. Give your health care provider a list of all the medicines, herbs, non-prescription drugs, or dietary supplements you use. Also tell them if you smoke, drink alcohol, or use illegal drugs. Some items may interact with your medicine. What should I watch for while using this medicine? Your condition will be monitored carefully while you are receiving this medicine. You will need important blood work done while you are taking this medicine. This drug may make you feel generally unwell. This is not uncommon, as chemotherapy can affect healthy cells as well as cancer cells. Report any side effects. Continue your  course of treatment even though you feel ill unless your doctor tells you to stop. In some  cases, you may be given additional medicines to help with side effects. Follow all directions for their use. Call your doctor or health care professional for advice if you get a fever, chills or sore throat, or other symptoms of a cold or flu. Do not treat yourself. This drug decreases your body's ability to fight infections. Try to avoid being around people who are sick. This medicine may increase your risk to bruise or bleed. Call your doctor or health care professional if you notice any unusual bleeding. Be careful brushing and flossing your teeth or using a toothpick because you may get an infection or bleed more easily. If you have any dental work done, tell your dentist you are receiving this medicine. Avoid taking products that contain aspirin, acetaminophen, ibuprofen, naproxen, or ketoprofen unless instructed by your doctor. These medicines may hide a fever. Do not become pregnant while taking this medicine. Women should inform their doctor if they wish to become pregnant or think they might be pregnant. There is a potential for serious side effects to an unborn child. Talk to your health care professional or pharmacist for more information. Do not breast-feed an infant while taking this medicine. Men are advised not to father a child while receiving this medicine. What side effects may I notice from receiving this medicine? Side effects that you should report to your doctor or health care professional as soon as possible: -allergic reactions like skin rash, itching or hives, swelling of the face, lips, or tongue -low blood counts - This drug may decrease the number of white blood cells, red blood cells and platelets. You may be at increased risk for infections and bleeding. -signs of infection - fever or chills, cough, sore throat, pain or difficulty passing urine -signs of decreased platelets or bleeding - bruising, pinpoint red spots on the skin, black, tarry stools, nosebleeds -signs of  decreased red blood cells - unusually weak or tired, fainting spells, lightheadedness -breathing problems -changes in vision -chest pain -high or low blood pressure -mouth sores -nausea and vomiting -pain, swelling, redness or irritation at the injection site -pain, tingling, numbness in the hands or feet -slow or irregular heartbeat -swelling of the ankle, feet, hands Side effects that usually do not require medical attention (report to your doctor or health care professional if they continue or are bothersome): -aches, pains -changes in the color of fingernails -diarrhea -hair loss -loss of appetite This list may not describe all possible side effects. Call your doctor for medical advice about side effects. You may report side effects to FDA at 1-800-FDA-1088. Where should I keep my medicine? This drug is given in a hospital or clinic and will not be stored at home. NOTE: This sheet is a summary. It may not cover all possible information. If you have questions about this medicine, talk to your doctor, pharmacist, or health care provider.  2015, [employer]. (2012-07-01 16:48:50)

## 2014-11-10 ENCOUNTER — Other Ambulatory Visit: Payer: Self-pay | Admitting: Radiology

## 2014-11-10 ENCOUNTER — Telehealth: Payer: Self-pay

## 2014-11-10 NOTE — Telephone Encounter (Signed)
Carolin Guernsey, RN  P Onc Triage Nurse [facility]           Patient of Dr. [name]. First time receiving Abraxane.     Pt reports no problems with new chemo.  Experiencing a little bit more energy.  Reports lymph node above her collar bone is almost gone.  Encouraged patient to call if she has any needs or questions.  Pt voiced understanding.

## 2014-11-11 ENCOUNTER — Ambulatory Visit: Payer: BLUE CROSS/BLUE SHIELD

## 2014-11-11 ENCOUNTER — Other Ambulatory Visit: Payer: Self-pay | Admitting: Radiology

## 2014-11-12 ENCOUNTER — Other Ambulatory Visit: Payer: Self-pay | Admitting: Oncology

## 2014-11-12 ENCOUNTER — Encounter (HOSPITAL_COMMUNITY): Payer: Self-pay

## 2014-11-12 ENCOUNTER — Ambulatory Visit (HOSPITAL_COMMUNITY)
Admission: RE | Admit: 2014-11-12 | Discharge: 2014-11-12 | Disposition: A | Discharge status: Home / Self Care | Payer: BLUE CROSS/BLUE SHIELD | Source: Ambulatory Visit | Attending: Oncology | Admitting: Oncology

## 2014-11-12 DIAGNOSIS — C778 Secondary and unspecified malignant neoplasm of lymph nodes of multiple regions: Secondary | ICD-10-CM | POA: Insufficient documentation

## 2014-11-12 DIAGNOSIS — E039 Hypothyroidism, unspecified: Secondary | ICD-10-CM | POA: Diagnosis not present

## 2014-11-12 DIAGNOSIS — C50411 Malignant neoplasm of upper-outer quadrant of right female breast: Secondary | ICD-10-CM

## 2014-11-12 DIAGNOSIS — C50819 Malignant neoplasm of overlapping sites of unspecified female breast: Secondary | ICD-10-CM | POA: Diagnosis present

## 2014-11-12 DIAGNOSIS — R918 Other nonspecific abnormal finding of lung field: Secondary | ICD-10-CM | POA: Insufficient documentation

## 2014-11-12 DIAGNOSIS — J910 Malignant pleural effusion: Secondary | ICD-10-CM | POA: Insufficient documentation

## 2014-11-12 DIAGNOSIS — J9811 Atelectasis: Secondary | ICD-10-CM | POA: Diagnosis not present

## 2014-11-12 DIAGNOSIS — Z923 Personal history of irradiation: Secondary | ICD-10-CM | POA: Diagnosis not present

## 2014-11-12 DIAGNOSIS — C7971 Secondary malignant neoplasm of right adrenal gland: Secondary | ICD-10-CM | POA: Insufficient documentation

## 2014-11-12 DIAGNOSIS — C50811 Malignant neoplasm of overlapping sites of right female breast: Secondary | ICD-10-CM | POA: Diagnosis not present

## 2014-11-12 DIAGNOSIS — Z9011 Acquired absence of right breast and nipple: Secondary | ICD-10-CM | POA: Diagnosis not present

## 2014-11-12 LAB — BASIC METABOLIC PANEL
Anion gap: 11 (ref 5–15)
BUN: 11 mg/dL (ref 6–20)
CALCIUM: 8.9 mg/dL (ref 8.9–10.3)
CO2: 28 mmol/L (ref 22–32)
CREATININE: 0.59 mg/dL (ref 0.44–1.00)
Chloride: 100 mmol/L — ABNORMAL LOW (ref 101–111)
GFR calc Af Amer: >60 mL/min (ref >60)
GLUCOSE: 105 mg/dL — AB (ref 65–99)
Potassium: 3.5 mmol/L (ref 3.5–5.1)
Sodium: 139 mmol/L (ref 135–145)

## 2014-11-12 LAB — CBC
HCT: 38.7 % (ref 36.0–46.0)
HEMOGLOBIN: 13.1 g/dL (ref 12.0–15.0)
MCH: 33.9 pg (ref 26.0–34.0)
MCHC: 33.9 g/dL (ref 30.0–36.0)
MCV: 100 fL (ref 78.0–100.0)
Platelets: 185 10*3/uL (ref 150–400)
RBC: 3.87 MIL/uL (ref 3.87–5.11)
RDW: 12.8 % (ref 11.5–15.5)
WBC: 4.3 10*3/uL (ref 4.0–10.5)

## 2014-11-12 LAB — PROTIME-INR
INR: 1.1 (ref 0.00–1.49)
Prothrombin Time: 14.4 seconds (ref 11.6–15.2)

## 2014-11-12 LAB — APTT: APTT: 30 s (ref 24–37)

## 2014-11-12 MED ORDER — LIDOCAINE-EPINEPHRINE 2 %-1:100000 IJ SOLN
INTRAMUSCULAR | Status: DC
Start: 2014-11-12 — End: 2014-11-13
  Filled 2014-11-12: qty 1

## 2014-11-12 MED ORDER — HEPARIN SOD (PORK) LOCK FLUSH 100 UNIT/ML IV SOLN
INTRAVENOUS | Status: AC | PRN
  Administered 2014-11-12: 500 [IU]

## 2014-11-12 MED ORDER — SODIUM CHLORIDE 0.9 % IV SOLN
Freq: Once | INTRAVENOUS | Status: AC
  Administered 2014-11-12: 13:00:00 via INTRAVENOUS

## 2014-11-12 MED ORDER — MIDAZOLAM HCL 2 MG/2ML IJ SOLN
INTRAMUSCULAR | Status: AC
  Filled 2014-11-12: qty 6

## 2014-11-12 MED ORDER — FENTANYL CITRATE (PF) 100 MCG/2ML IJ SOLN
INTRAMUSCULAR | Status: AC
  Filled 2014-11-12: qty 4

## 2014-11-12 MED ORDER — HEPARIN SOD (PORK) LOCK FLUSH 100 UNIT/ML IV SOLN
INTRAVENOUS | Status: AC
  Filled 2014-11-12: qty 5

## 2014-11-12 MED ORDER — LIDOCAINE HCL 1 % IJ SOLN
INTRAMUSCULAR | Status: AC
  Filled 2014-11-12: qty 20

## 2014-11-12 MED ORDER — FENTANYL CITRATE (PF) 100 MCG/2ML IJ SOLN
INTRAMUSCULAR | Status: AC | PRN
  Administered 2014-11-12 (×2): 25 ug via INTRAVENOUS

## 2014-11-12 MED ORDER — CEFAZOLIN SODIUM-DEXTROSE 2-3 GM-% IV SOLR
2.0000 g | Freq: Once | INTRAVENOUS | Status: AC
  Administered 2014-11-12: 2 g via INTRAVENOUS

## 2014-11-12 MED ORDER — CEFAZOLIN SODIUM-DEXTROSE 2-3 GM-% IV SOLR
INTRAVENOUS | Status: AC
  Filled 2014-11-12: qty 50

## 2014-11-12 MED ORDER — MIDAZOLAM HCL 2 MG/2ML IJ SOLN
INTRAMUSCULAR | Status: AC | PRN
  Administered 2014-11-12 (×4): 0.5 mg via INTRAVENOUS
  Administered 2014-11-12: 1 mg via INTRAVENOUS
  Administered 2014-11-12 (×2): 0.5 mg via INTRAVENOUS

## 2014-11-12 NOTE — H&P (Signed)
Chief Complaint: Hx breast Ca; recurrence  Referring Physician(s): [name],[name]  History of Present Illness: Nicole Valentine is a 49 y.o. female   Hx Breast Ca With recurrence 11/06/2014 Bx of SCLN shows metastatic adenocarcinoma PET shows metastatic widespread Now scheduled for Port a Cath placement Suffering some cough and shortness of breath Rt Thoracentesis 10/29/14--- 1 liter: + malignant   Past Medical History  Diagnosis Date  . Complication of anesthesia     her father and uncle both had very difficult time waking up-was  something they told them may be hereditory. she will find out.  [name] Hypothyroidism   . 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 Radiation 01/21/13-03/06/13    Right Breast  . Breast cancer dx'd 04-10-12-rt    Past Surgical History  Procedure Laterality Date  . Wisdom tooth extraction    . Portacath placement  04/22/2012    Procedure: INSERTION PORT-A-CATH;  Surgeon: [name], MD;  Location: [place];  Service: General;  Laterality: Left;  [name] Modified mastectomy Right 11/18/2012    Procedure:  RIGHT MODIFIED MASTECTOMY;  Surgeon: [name], MD;  Location: [place];  Service: General;  Laterality: Right;  . Knee arthroscopy Right 11/26/2013  . Port-a-cath removal Left 12/10/2013    Procedure: MINOR REMOVAL PORT-A-CATH;  Surgeon: [name], MD;  Location: [place];  Service: General;  Laterality: Left;    Allergies: Almond meal and Other  Medications: Prior to Admission medications   Medication Sig Start Date End Date Taking? Authorizing Provider  Ascorbic Acid (VITAMIN C PO) Take 1 tablet by mouth at bedtime.    Yes Historical Provider, MD  azithromycin (ZITHROMAX) 250 MG tablet Take as directed 11/09/14  Yes Chauncey Cruel, MD  cholecalciferol (VITAMIN D) 1000 UNITS tablet Take 2,000 Units by mouth 2 (two) times daily.    Yes Historical Provider, MD  fexofenadine (ALLEGRA)  180 MG tablet Take 180 mg by mouth daily as needed for allergies or rhinitis.   Yes Historical Provider, MD  ibuprofen (ADVIL,MOTRIN) 200 MG tablet Take 400 mg by mouth every 6 (six) hours as needed for headache or moderate pain.   Yes Historical Provider, MD  levothyroxine (SYNTHROID, LEVOTHROID) 175 MCG tablet Take 175 mcg by mouth daily before breakfast.   Yes Historical Provider, MD  LORazepam (ATIVAN) 0.5 MG tablet Take 1 tablet (0.5 mg total) by mouth at bedtime as needed (Nausea or vomiting). 11/06/14  Yes Chauncey Cruel, MD  ondansetron (ZOFRAN) 8 MG tablet Take 1 tablet (8 mg total) by mouth 2 (two) times daily. Start the day after chemo for 2 days. Then take as needed for nausea or vomiting. 11/06/14  Yes Chauncey Cruel, MD  lidocaine-prilocaine (EMLA) cream Apply to affected area once 11/06/14   Chauncey Cruel, MD  prochlorperazine (COMPAZINE) 10 MG tablet Take 1 tablet (10 mg total) by mouth every 6 (six) hours as needed (Nausea or vomiting). 11/06/14   Chauncey Cruel, MD     Family History  Problem Relation Age of Onset  . Lung cancer Maternal Grandfather 46    smoker  . Prostate cancer Maternal Grandfather 90  . Throat cancer Other     Great Aunt x 2  . Liver cancer Other     Maternal Great Grandmother  . Melanoma Maternal Uncle 81  . Brain cancer Cousin 11    non-malignant    History   Social  History  . Marital Status: Married    Spouse Name: N/A  . Number of Children: N/A  . Years of Education: N/A   Social History Main Topics  . Smoking status: Never Smoker   . Smokeless tobacco: Never Used  . Alcohol Use: No  . Drug Use: No  . Sexual Activity: Yes   Other Topics Concern  . None   Social History Narrative     Review of Systems: A 12 point ROS discussed and pertinent positives are indicated in the HPI above.  All other systems are negative.  Review of Systems  Constitutional: Positive for activity change.  Respiratory: Positive for cough and  shortness of breath.   Cardiovascular: Negative for chest pain.  Gastrointestinal: Negative for abdominal pain.  Neurological: Positive for weakness.  Psychiatric/Behavioral: Negative for behavioral problems and confusion.    Vital Signs: BP 108/70 mmHg  Pulse 99  Temp(Src) 98 F (36.7 C) (Oral)  Resp 18  SpO2 93%  LMP 03/29/2012  Physical Exam  Constitutional: She is oriented to person, place, and time. She appears well-nourished.  Cardiovascular: Normal rate and regular rhythm.   No murmur heard. Pulmonary/Chest: Effort normal. She has no wheezes.  Abdominal: Soft. Bowel sounds are normal. There is no tenderness.  Musculoskeletal: Normal range of motion.  Neurological: She is alert and oriented to person, place, and time.  Skin: Skin is warm and dry.  Psychiatric: She has a normal mood and affect. Her behavior is normal. Judgment and thought content normal.  Nursing note and vitals reviewed.   Mallampati Score:  MD Evaluation Airway: WNL Heart: WNL Abdomen: WNL Chest/ Lungs: WNL ASA  Classification: 3 Mallampati/Airway Score: One  Imaging: Dg Chest 1 View  10/29/2014   CLINICAL DATA:  Post thoracentesis chest radiograph.  Cough.  EXAM: CHEST  1 VIEW  COMPARISON:  CT 10/28/2014.  FINDINGS: No pneumothorax. Small RIGHT pleural effusion with consolidation at the RIGHT base, probably representing atelectatic lung in the setting of previously seen large effusion. No LEFT pleural effusion. RIGHT axillary dissection clips present.  IMPRESSION: 1. No pneumothorax status post RIGHT thoracentesis. 2. Residual collapse/consolidation of the RIGHT base.   Electronically Signed   By: Dereck Ligas M.D.   On: 10/29/2014 15:38   Ct Head W Wo Contrast  10/28/2014   CLINICAL DATA:  Breast cancer.  Visual changes.  EXAM: CT HEAD WITHOUT AND WITH CONTRAST  TECHNIQUE: Contiguous axial images were obtained from the base of the skull through the vertex without and with intravenous contrast   CONTRAST:  126mL OMNIPAQUE IOHEXOL 300 MG/ML  SOLN  COMPARISON:  None.  FINDINGS: Ventricle size normal. Negative for acute or chronic ischemia. Negative for mass or edema. Negative for hemorrhage  Normal enhancement following contrast infusion.  Skull is normal.  IMPRESSION: Negative   Electronically Signed   By: Franchot Gallo M.D.   On: 10/28/2014 15:33   Ct Soft Tissue Neck W Contrast  10/28/2014   CLINICAL DATA:  Breast cancer. Small palpable lymph nodes right supraclavicular region.  EXAM: CT NECK WITH CONTRAST  TECHNIQUE: Multidetector CT imaging of the neck was performed using the standard protocol following the bolus administration of intravenous contrast.  CONTRAST:  174mL OMNIPAQUE IOHEXOL 300 MG/ML  SOLN  COMPARISON:  PET-CT 04/19/2012  FINDINGS: Pharynx and larynx: Negative. Well-defined density lateral to the right molars consistent with a lozenge or tablet. Negative larynx.  Salivary glands: Negative  Thyroid: Small thyroid gland without mass lesion.  Lymph nodes: Right  supraclavicular lymph nodes are enlarged with heterogeneous enhancement compatible with metastatic disease. Multiple lymph nodes are seen around the right jugular vein measuring up to 12 mm. Right supraclavicular node 15 mm.  Left supraclavicular lymph node measures 9 x 20 mm and is most consistent with metastatic disease. Left posterior superior axillary node measures 11 mm and appears to represent a pathologic node. Left level 2 node is 5 mm and is indeterminate. Small posterior lymph nodes are present on the left and are indeterminate. Right level 2 node measures 6 mm and is indeterminate.  Vascular: Jugular vein patent bilaterally. Carotid artery patent bilaterally.  Limited intracranial: Negative  Visualized orbits: Negative  Mastoids and visualized paranasal sinuses: Negative  Skeleton: Disc degeneration and spondylosis C5-6. No bony lesion identified.  Upper chest: Large right pleural effusion. Atelectasis in the right upper  lobe.  IMPRESSION: Bilateral supraclavicular adenopathy compatible metastatic disease. Small level 2 and level 5 nodes are present bilaterally which could be due to metastatic disease as well.  Large right pleural effusion, worrisome for malignancy. PET is recommended for further evaluation.   Electronically Signed   By: Franchot Gallo M.D.   On: 10/28/2014 15:48   Nm Pet Image Restag (ps) Skull Base To Thigh  11/06/2014   CLINICAL DATA:  Subsequent treatment strategy for breast cancer. Restaging examination. History of right-sided breast cancer status post right-sided modified radical mastectomy 11/18/2012.  EXAM: NUCLEAR MEDICINE PET SKULL BASE TO THIGH  TECHNIQUE: 8.8 mCi F-18 FDG was injected intravenously. Full-ring PET imaging was performed from the skull base to thigh after the radiotracer. CT data was obtained and used for attenuation correction and anatomic localization.  FASTING BLOOD GLUCOSE:  Value: 107 mg/dl  COMPARISON:  Chest CT 04/20/2014.  PET-CT 04/19/2012.  FINDINGS: NECK  Multiple borderline and mildly enlarged low cervical and supraclavicular lymph nodes which are markedly hypermetabolic bilaterally (right greater than left).  CHEST  Innumerable borderline enlarged and mildly enlarged hypermetabolic mediastinal and bilateral hilar lymph nodes are hypermetabolic. The right hilar nodal tissue is contiguous with parenchymal mass-like opacities involving the right lower lobe and to a lesser extent the right middle lobe. There are several air bronchograms throughout this region, however, given the general mass-like appearance of this area and the associated profound septal thickening which extends into the surrounding lung parenchyma and is somewhat nodular in appearance, this entire area is favored to be predominantly related to lymphangitic spread of tumor (in addition to some associated postobstructive changes), and this region is diffusely hypermetabolic (SUVmax = 09.3). Associated with this  is a moderate right-sided pleural effusion, and there is some associated pleural hypermetabolism in the base of the right hemithorax (SUVmax = 10.0) indicative of a malignant pleural effusion. Multiple pulmonary nodules are noted in the lungs bilaterally, which demonstrate hypermetabolism, compatible with additional metastatic foci.  ABDOMEN/PELVIS  Several areas of hypermetabolism are noted in the liver, but are not readily detectable on the CT images. The largest focus of hypermetabolism is in the superior aspect of segment 8 (SUVmax = 6.3). Gallbladder, pancreas, spleen, left adrenal gland and bilateral kidneys are unremarkable in appearance. There is a small focus of hypermetabolism in the right adrenal gland (SUVmax = 10.3). In addition, there are several borderline enlarged upper abdominal lymph nodes, most evident in the celiac axis which are hypermetabolic (SUVmax = 2.3-5.5). Mild atherosclerosis throughout the abdominal and pelvic vasculature. No significant volume of ascites. No pneumoperitoneum. No pathologic distention of small bowel or colon. Uterus and ovaries are  unremarkable in appearance. Status post right modified radical mastectomy and axillary lymph node dissection.  SKELETON  Multifocal skeletal hypermetabolism, compatible with widespread metastatic disease to the bones. Some of the larger lesions include diffuse hypermetabolism and subtle sclerosis in T9 vertebral body (SUVmax = 15.4), a subtly sclerotic hypermetabolic lesion in the inferior aspect of the sternum (SUVmax = 13.4), multiple rib lesions, including the posterior aspect of the left seventh rib, small hypermetabolic focus in the periphery of the left humeral head, and multiple sclerotic lesions in the ileum bilaterally, largest of which is on the right measuring approximately 1.9 cm (SUVmax = 19.0).  IMPRESSION: 1. Today's study demonstrates widespread metastatic disease throughout the neck, chest, abdomen and pelvis, with widespread  pulmonary metastases, including a large volume of lymphangitic spread of disease in the right middle and lower lobes, a malignant right pleural effusion, multiple hepatic lesions, a right adrenal metastasis, numerous osseous lesions, and widespread lymphadenopathy, as detailed above. These results were discussed by telephone at the time of interpretation on 11/06/2014 at 3:10 pm to Dr. Lurline Del, who verbally acknowledged these results.   Electronically Signed   By: Vinnie Langton M.D.   On: 11/06/2014 15:29   US Biopsy  11/06/2014   CLINICAL DATA:  49 year old female with a history of breast carcinoma, now with possible recurrence. She has been referred for percutaneous biopsy of cervical lymph node.  EXAM: ULTRASOUND GUIDED CORE BIOPSY OF CERVICAL LYMPH NODE  MEDICATIONS: 1.5 mg IV Versed; 75 mcg IV Fentanyl  Total Moderate Sedation Time: 14  PROCEDURE: The procedure, risks, benefits, and alternatives were explained to the patient. Questions regarding the procedure were encouraged and answered. The patient understands and consents to the procedure.  Ultrasound survey was performed with images stored and sent to PACs.  The right neck was prepped with Betadine in a sterile fashion, and a sterile drape was applied covering the operative field. A sterile gown and sterile gloves were used for the procedure. Local anesthesia was provided with 1% Lidocaine.  Ultrasound guidance was then used to infiltrate the skin and subcutaneous tissues with 1% lidocaine, and then a small stab incision was made at the base of the right neck with an 11 blade scalpel. A biopsy gun (18 gauge) was then advanced into the pathologic note at the base of the right neck, with 3 subsequent core biopsy achieved under ultrasound guidance.  The patient tolerated the procedure well and remained hemodynamically stable throughout.  No complications were encountered and no significant blood loss was encountered.  Small bandage was placed  after the procedure.  Samples were placed into formalin for transportation to the lab.  Final image was stored after biopsy.  Patient tolerated the procedure well and remained hemodynamically stable throughout.  COMPLICATIONS: None.  FINDINGS: Ultrasound survey demonstrates multiple abnormal appearing lymph nodes/nodules within the right supraclavicular region and cervical region.  Images during the case demonstrate placement of biopsy gun into right cervical/supraclavicular lymph node.  Final image demonstrates no complicating features.  IMPRESSION: Status post ultrasound-guided biopsy of right-sided cervical/ supraclavicular lymph nodes, with tissue specimen sent to pathology for complete histopathologic analysis.  Signed,  Dulcy Fanny. Earleen Newport, DO  [facility]  [facility]   Electronically Signed   By: Corrie Mckusick D.O.   On: 11/06/2014 14:23   US Thoracentesis Asp Pleural Space W/img Guide  10/29/2014   INDICATION: Patient with history of right breast cancer; now with some dyspnea with exertion and right pleural effusion.  Request is made for diagnostic and therapeutic right thoracentesis.  EXAM: ULTRASOUND GUIDED DIAGNOSTIC AND THERAPEUTIC RIGHT THORACENTESIS  COMPARISON:  None.  MEDICATIONS: None  COMPLICATIONS: None immediate  TECHNIQUE: Informed written consent was obtained from the patient after a discussion of the risks, benefits and alternatives to treatment. A timeout was performed prior to the initiation of the procedure.  Initial ultrasound scanning demonstrates a moderate to large right pleural effusion. The lower chest was prepped and draped in the usual sterile fashion. 1% lidocaine was used for local anesthesia.  An ultrasound image was saved for documentation purposes. A 6 Fr Safe-T-Centesis catheter was introduced. The thoracentesis was performed. The catheter was removed and a dressing was applied. The patient tolerated the procedure well without  immediate post procedural complication. The patient was escorted to have an upright chest radiograph.  FINDINGS: A total of approximately 1 liter of hazy, yellow fluid was removed. Requested samples were sent to the laboratory.  IMPRESSION: Successful ultrasound-guided diagnostic and therapeutic right sided thoracentesis yielding 1 liter of pleural fluid.  Read by: Rowe Robert, PA-C   Electronically Signed   By: Lucrezia Europe M.D.   On: 10/29/2014 15:39    Labs:  CBC:  Recent Labs  07/08/14 1205 11/06/14 1118 11/09/14 1328 11/12/14 1310  WBC 4.8 4.9 5.1 4.3  HGB 14.9 14.4 14.4 13.1  HCT 44.5 42.3 42.5 38.7  PLT 157 183 215 185    COAGS:  Recent Labs  11/06/14 1118 11/12/14 1310  INR 1.10 1.10  APTT 32 30    BMP:  Recent Labs  01/02/14 1551 03/30/14 1420 07/08/14 1205 11/09/14 1328  NA 143 141 142 141  K 4.0 3.5 4.1 3.8  CO2 29 29 26 28   GLUCOSE 92 102 87 106  BUN 13.1 13.4 14.6 8.0  CALCIUM 9.3 9.0 9.4 9.4  CREATININE 0.8 0.7 0.8 0.7    LIVER FUNCTION TESTS:  Recent Labs  01/02/14 1551 03/30/14 1420 07/08/14 1205 11/09/14 1328  BILITOT 0.46 0.48 0.60 0.70  AST 18 21 31 17   ALT 11 21 23 21   ALKPHOS 110 124 105 149  PROT 6.9 6.8 7.2 6.7  ALBUMIN 3.7 3.7 3.9 3.3*    TUMOR MARKERS: No results for input(s): AFPTM, CEA, CA199, CHROMGRNA in the last 8760 hours.  Assessment and Plan:  Hx breast cancer---now recurrence For PAC placement today Risks and Benefits discussed with the patient including, but not limited to bleeding, infection, pneumothorax, or fibrin sheath development and need for additional procedures. All of the patient's questions were answered, patient is agreeable to proceed. Consent signed and in chart.   Thank you for this interesting consult.  I greatly enjoyed meeting Nicole Valentine and look forward to participating in their care.  Signed: Amesha Bailey A 11/12/2014, 1:56 PM   I spent a total of  20 Minutes   in face to face  in clinical consultation, greater than 50% of which was counseling/coordinating care for [facility]

## 2014-11-12 NOTE — Procedures (Signed)
Placement of left Portacath.  Tip at SVC/RA junction.  Minimal blood loss.  No immediate complication.

## 2014-11-12 NOTE — Discharge Instructions (Signed)
Implanted Port Insertion, Care After °Refer to this sheet in the next few weeks. These instructions provide you with information on caring for yourself after your procedure. Your health care provider may also give you more specific instructions. Your treatment has been planned according to current medical practices, but problems sometimes occur. Call your health care provider if you have any problems or questions after your procedure. °WHAT TO EXPECT AFTER THE PROCEDURE °After your procedure, it is typical to have the following:  °· Discomfort at the port insertion site. Ice packs to the area will help. °· Bruising on the skin over the port. This will subside in 3-4 days. °HOME CARE INSTRUCTIONS °· After your port is placed, you will get a manufacturer's information card. The card has information about your port. Keep this card with you at all times.   °· Know what kind of port you have. There are many types of ports available.   °· Wear a medical alert bracelet in case of an emergency. This can help alert health care workers that you have a port.   °· The port can stay in for as long as your health care provider believes it is necessary.   °· A home health care nurse may give medicines and take care of the port.   °· You or a family member can get special training and directions for giving medicine and taking care of the port at home.   °SEEK MEDICAL CARE IF:  °· Your port does not flush or you are unable to get a blood return.   °· You have a fever or chills. °SEEK IMMEDIATE MEDICAL CARE IF: °· You have new fluid or pus coming from your incision.   °· You notice a bad smell coming from your incision site.   °· You have swelling, pain, or more redness at the incision or port site.   °· You have chest pain or shortness of breath. °Document Released: 02/26/2013 Document Revised: 05/13/2013 Document Reviewed: 02/26/2013 °Exit[employer]® Patient Information ©2015 [employer]. This information is not intended to replace  advice given to you by your health care provider. Make sure you discuss any questions you have with your health care provider. °Conscious Sedation, Adult, Care After °Refer to this sheet in the next few weeks. These instructions provide you with information on caring for yourself after your procedure. Your health care provider may also give you more specific instructions. Your treatment has been planned according to current medical practices, but problems sometimes occur. Call your health care provider if you have any problems or questions after your procedure. °WHAT TO EXPECT AFTER THE PROCEDURE  °After your procedure: °· You may feel sleepy, clumsy, and have poor balance for several hours. °· Vomiting may occur if you eat too soon after the procedure. °HOME CARE INSTRUCTIONS °· Do not participate in any activities where you could become injured for at least 24 hours. Do not: °¨ Drive. °¨ Swim. °¨ Ride a bicycle. °¨ Operate heavy machinery. °¨ Cook. °¨ Use power tools. °¨ Climb ladders. °¨ Work from a high place. °· Do not make important decisions or sign legal documents until you are improved. °· If you vomit, drink water, juice, or soup when you can drink without vomiting. Make sure you have little or no nausea before eating solid foods. °· Only take over-the-counter or prescription medicines for pain, discomfort, or fever as directed by your health care provider. °· Make sure you and your family fully understand everything about the medicines given to you, including what side effects   may occur. °· You should not drink alcohol, take sleeping pills, or take medicines that cause drowsiness for at least 24 hours. °· If you smoke, do not smoke without supervision. °· If you are feeling better, you may resume normal activities 24 hours after you were sedated. °· Keep all appointments with your health care provider. °SEEK MEDICAL CARE IF: °· Your skin is pale or bluish in color. °· You continue to feel nauseous or  vomit. °· Your pain is getting worse and is not helped by medicine. °· You have bleeding or swelling. °· You are still sleepy or feeling clumsy after 24 hours. °SEEK IMMEDIATE MEDICAL CARE IF: °· You develop a rash. °· You have difficulty breathing. °· You develop any type of allergic problem. °· You have a fever. °MAKE SURE YOU: °· Understand these instructions. °· Will watch your condition. °· Will get help right away if you are not doing well or get worse. °Document Released: 02/26/2013 Document Reviewed: 02/26/2013 °ExitCare® Patient Information ©2015 [employer]. This information is not intended to replace advice given to you by your health care provider. Make sure you discuss any questions you have with your health care provider. ° ° °

## 2014-11-13 ENCOUNTER — Ambulatory Visit (HOSPITAL_BASED_OUTPATIENT_CLINIC_OR_DEPARTMENT_OTHER): Payer: BLUE CROSS/BLUE SHIELD | Admitting: Oncology

## 2014-11-13 ENCOUNTER — Ambulatory Visit (HOSPITAL_BASED_OUTPATIENT_CLINIC_OR_DEPARTMENT_OTHER): Payer: BLUE CROSS/BLUE SHIELD

## 2014-11-13 VITALS — BP 130/74 | HR 89 | Temp 98.1°F | Resp 21 | Ht 67.0 in | Wt 179.4 lb

## 2014-11-13 DIAGNOSIS — C7801 Secondary malignant neoplasm of right lung: Secondary | ICD-10-CM | POA: Diagnosis not present

## 2014-11-13 DIAGNOSIS — C50919 Malignant neoplasm of unspecified site of unspecified female breast: Secondary | ICD-10-CM

## 2014-11-13 DIAGNOSIS — C7951 Secondary malignant neoplasm of bone: Secondary | ICD-10-CM | POA: Diagnosis not present

## 2014-11-13 DIAGNOSIS — C50411 Malignant neoplasm of upper-outer quadrant of right female breast: Secondary | ICD-10-CM

## 2014-11-13 DIAGNOSIS — C778 Secondary and unspecified malignant neoplasm of lymph nodes of multiple regions: Secondary | ICD-10-CM

## 2014-11-13 DIAGNOSIS — C7971 Secondary malignant neoplasm of right adrenal gland: Secondary | ICD-10-CM

## 2014-11-13 DIAGNOSIS — J910 Malignant pleural effusion: Secondary | ICD-10-CM

## 2014-11-13 DIAGNOSIS — C787 Secondary malignant neoplasm of liver and intrahepatic bile duct: Secondary | ICD-10-CM | POA: Diagnosis not present

## 2014-11-13 LAB — CBC WITH DIFFERENTIAL/PLATELET
BASO%: 0.3 % (ref 0.0–2.0)
Basophils Absolute: 0 10*3/uL (ref 0.0–0.1)
EOS%: 1.4 % (ref 0.0–7.0)
Eosinophils Absolute: 0.1 10*3/uL (ref 0.0–0.5)
HCT: 39.9 % (ref 34.8–46.6)
HGB: 13.7 g/dL (ref 11.6–15.9)
LYMPH#: 0.7 10*3/uL — AB (ref 0.9–3.3)
LYMPH%: 18.4 % (ref 14.0–49.7)
MCH: 34.8 pg — ABNORMAL HIGH (ref 25.1–34.0)
MCHC: 34.3 g/dL (ref 31.5–36.0)
MCV: 101.3 fL — ABNORMAL HIGH (ref 79.5–101.0)
MONO#: 0.1 10*3/uL (ref 0.1–0.9)
MONO%: 2.7 % (ref 0.0–14.0)
NEUT#: 2.8 10*3/uL (ref 1.5–6.5)
NEUT%: 77.2 % — ABNORMAL HIGH (ref 38.4–76.8)
NRBC: 0 % (ref 0–0)
Platelets: 179 10*3/uL (ref 145–400)
RBC: 3.94 10*6/uL (ref 3.70–5.45)
RDW: 13 % (ref 11.2–14.5)
WBC: 3.7 10*3/uL — ABNORMAL LOW (ref 3.9–10.3)

## 2014-11-13 NOTE — Addendum Note (Signed)
Addended by: Prentiss Bells on: 11/13/2014 01:32 PM   Modules accepted: Medications

## 2014-11-13 NOTE — Progress Notes (Signed)
And a Patient ID: Jackeline Gutknecht, female   DOB: 01-Apr-1966, 49 y.o.   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 She had a mammogram performed that showed dense breasts bilaterally. Ultrasound of the right breast showed 2.3 x 1.9 cm area of abnormality with multiple abnormal lymph nodes. MRI of the bilateral breasts showed asymmetrical enhancement throughout the right breast consistent with multicentric disease. An ultrasound-guided biopsy performed. The known area of disease measured 1.9 x 1.9 x 2.3 cm. Multiple abnormal positive right axillary lymph nodes were noted. 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 Biopsy of the right axillary lymph node was positive for malignancy with extracapsular extension. Patient was originally seen by Dr. [name] Dr. [name] and Dr. [name]. She has elected to have a right mastectomy eventually and declined biopsies of any other areas within the breast."  She went on to receive neoadjuvant chemotherapy and attained a complete pathologic response, as documented below  [place] noted a very small right supraclavicular mass 09/25/2014, which she brought to our attention. She was having some allergy symptoms at the time so we decided to reevaluate this after a few weeks and on 10/22/2014 as the mass persisted we obtained a restaging neck CT scan, 10/28/2014. There was bilateral supraclavicular adenopathy, but also a large right pleural effusion was incidentally noted. We proceeded to right thoracentesis 10/29/2014, and a liter of  hazy yellow fluid was removed. Cytology from this procedure ([id number]) showed malignant cells consistent with adenocarcinoma, estrogen and progesterone receptor negative, HER-2 not amplified, with a signals ratio 1.12 and the number per cell being 2.75, and with an MIB-1 of 80%. I called Daphene with these results and set her up for a PET scan performed 11/06/2014, which shows widespread metastatic disease involving particularly the right lung, liver, and bones, but also the left lung, right adrenal, and multiple lymph node areas.  Her subsequent history is as detailed below.  INTERVAL HISTORY: Dyonna returns to the breast clinic today accompanied by her husband [name]. Since her last visit here she had a left IJ port placed. She also received her first dose of Abraxane--today is day 5 cycle 1 of 8 planned cycles.  REVIEW OF S/YSTEMS: She is still sore from the port placement and of course notes the strange line in her left left neck now. She is taking Tylenol for that discomfort, very sparingly. As far as the Abraxane is concerned she did very well days 1 and 2. She had no problems with nausea. She only took the Zofran. It did not cause headaches. On day 3 she felt very tired, slept a couple of hours extra, and took it easy. Day 4, yesterday, she was getting back to normal. She has noted that a lymph node on her right neck which used to be quite prominent is already decreased. A detailed review of systems today is otherwise stable.  PAST MEDICAL HISTORY: Past Medical History  Diagnosis Date  . Complication of anesthesia     her father and uncle both had very difficult time waking up-was  something they told them may be hereditory.  she will find out.  [name] Hypothyroidism   . 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 Radiation 01/21/13-03/06/13    Right Breast  . Breast cancer dx'd 04-10-12-rt    PAST SURGICAL HISTORY: Past Surgical History  Procedure Laterality Date  . Wisdom tooth extraction    .  Portacath placement  04/22/2012    Procedure: INSERTION PORT-A-CATH;  Surgeon: [name], MD;  Location: [place];  Service: General;  Laterality: Left;  [name] Modified mastectomy Right 11/18/2012    Procedure:  RIGHT MODIFIED MASTECTOMY;  Surgeon: [name], MD;  Location: [place];  Service: General;  Laterality: Right;  . Knee arthroscopy Right 11/26/2013  . Port-a-cath removal Left 12/10/2013    Procedure: MINOR REMOVAL PORT-A-CATH;  Surgeon: [name], MD;  Location: [place];  Service: General;  Laterality: Left;    FAMILY HISTORY Family History  Problem Relation Age of Onset  . Lung cancer Maternal Grandfather 42    smoker  . Prostate cancer Maternal Grandfather 90  . Throat cancer Other     Great Aunt x 2  . Liver cancer Other     Maternal Great Grandmother  . Melanoma Maternal Uncle 81  . Brain cancer Cousin 11    non-malignant  the patient's parents are living, in their early 55s. The patient has 2 brothers, no sisters. The patient's mother was diagnosed with breast cancer, HER-2 positive, in 2014 in [place].  GYNECOLOGIC HISTORY:   (Updated 10/03/2013) Menarche age 33, first live birth age 27. She is GX P4. LMP January 2014. 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  The children are currently ages 44, 18, 59, and 7. Her husband, [name], is a Customer service manager. They attend the [employer] DIRECTIVES: The patient's husband is her HCPOA   HEALTH MAINTENANCE:  (Updated 10/03/2013) History  Substance Use Topics  . Smoking status: Never Smoker   . Smokeless tobacco: Never Used  . Alcohol Use: No     Colonoscopy: Never  PAP: November 2013/Dr. Smith  Bone density: January 2015, [facility], normal  Lipid panel:  Not on file  Allergies  Allergen Reactions  . Almond Meal Rash  . Other Rash    LOTIONS     Current Outpatient Prescriptions  Medication Sig Dispense Refill  . Ascorbic Acid (VITAMIN C PO) Take 1 tablet by mouth at bedtime.     [name] azithromycin (ZITHROMAX) 250 MG tablet Take as directed 6 each 0  . cholecalciferol (VITAMIN D) 1000 UNITS tablet Take 2,000 Units by mouth 2 (two) times daily.     . fexofenadine (ALLEGRA) 180 MG tablet Take 180 mg by mouth daily as needed for allergies or rhinitis.    [name] ibuprofen (ADVIL,MOTRIN) 200 MG tablet Take 400 mg by mouth every 6 (six) hours as needed for headache or moderate pain.    [name] levothyroxine (SYNTHROID, LEVOTHROID) 175 MCG tablet Take 175 mcg by mouth daily before breakfast.    . lidocaine-prilocaine (EMLA) cream Apply to affected area once 30 g 3  . LORazepam (ATIVAN) 0.5 MG tablet Take 1 tablet (0.5 mg total) by mouth at bedtime as needed (Nausea or vomiting). 30 tablet 0  . ondansetron (ZOFRAN) 8 MG tablet Take 1 tablet (8 mg total) by mouth 2 (two) times daily. Start the day after chemo for 2 days.  Then take as needed for nausea or vomiting. 30 tablet 1  . prochlorperazine (COMPAZINE) 10 MG tablet Take 1 tablet (10 mg total) by mouth every 6 (six) hours as needed (Nausea or vomiting). 30 tablet 1   No current facility-administered medications for this visit.      OBJECTIVE: Middle-aged white woman who appears stated age 65 Vitals:   11/13/14 0946  BP: 130/74  Pulse: 89  Temp: 98.1 F (36.7 C)  Resp: 21     Body mass index is 28.09 kg/(m^2).    ECOG FS:1 - Symptomatic but completely ambulatory Filed Weights   11/13/14 0946  Weight: 179 lb 6.4 oz (81.375 kg)   Sclerae unicteric, EOMs intact Oropharynx clear, dentition in good repair Right supraclavicular adenopathy as previously noted Lungs decreased breath sounds right base Heart regular rate and rhythm Abd soft, nontender, positive bowel sounds MSK no focal spinal tenderness, no upper extremity lymphedema Neuro: nonfocal, well oriented, appropriate  affect Breasts: deferred    LAB RESULTS:     Lab Results  Component Value Date   WBC 3.7* 11/13/2014   NEUTROABS 2.8 11/13/2014   HGB 13.7 11/13/2014   HCT 39.9 11/13/2014   MCV 101.3* 11/13/2014   PLT 179 11/13/2014      Chemistry      Component Value Date/Time   NA 139 11/12/2014 1310   NA 141 11/09/2014 1328   K 3.5 11/12/2014 1310   K 3.8 11/09/2014 1328   CL 100* 11/12/2014 1310   CL 101 10/18/2012 0859   CO2 28 11/12/2014 1310   CO2 28 11/09/2014 1328   BUN 11 11/12/2014 1310   BUN 8.0 11/09/2014 1328   CREATININE 0.59 11/12/2014 1310   CREATININE 0.7 11/09/2014 1328      Component Value Date/Time   CALCIUM 8.9 11/12/2014 1310   CALCIUM 9.4 11/09/2014 1328   ALKPHOS 149 11/09/2014 1328   AST 17 11/09/2014 1328   ALT 21 11/09/2014 1328   BILITOT 0.70 11/09/2014 1328       STUDIES: Dg Chest 1 View  10/29/2014   CLINICAL DATA:  Post thoracentesis chest radiograph.  Cough.  EXAM: CHEST  1 VIEW  COMPARISON:  CT 10/28/2014.  FINDINGS: No pneumothorax. Small RIGHT pleural effusion with consolidation at the RIGHT base, probably representing atelectatic lung in the setting of previously seen large effusion. No LEFT pleural effusion. RIGHT axillary dissection clips present.  IMPRESSION: 1. No pneumothorax status post RIGHT thoracentesis. 2. Residual collapse/consolidation of the RIGHT base.   Electronically Signed   By: Dereck Ligas M.D.   On: 10/29/2014 15:38   Ct Head W Wo Contrast  10/28/2014   CLINICAL DATA:  Breast cancer.  Visual changes.  EXAM: CT HEAD WITHOUT AND WITH CONTRAST  TECHNIQUE: Contiguous axial images were obtained from the base of the skull through the vertex without and with intravenous contrast  CONTRAST:  129m OMNIPAQUE IOHEXOL 300 MG/ML  SOLN  COMPARISON:  None.  FINDINGS: Ventricle size normal. Negative for acute or chronic ischemia. Negative for mass or edema. Negative for hemorrhage  Normal enhancement following contrast infusion.  Skull is  normal.  IMPRESSION: Negative   Electronically Signed   By: CFranchot GalloM.D.   On: 10/28/2014 15:33   Ct Soft Tissue Neck W Contrast  10/28/2014   CLINICAL DATA:  Breast cancer. Small palpable lymph nodes right supraclavicular region.  EXAM: CT NECK WITH CONTRAST  TECHNIQUE: Multidetector CT imaging of the neck was performed using the standard protocol  following the bolus administration of intravenous contrast.  CONTRAST:  136m OMNIPAQUE IOHEXOL 300 MG/ML  SOLN  COMPARISON:  PET-CT 04/19/2012  FINDINGS: Pharynx and larynx: Negative. Well-defined density lateral to the right molars consistent with a lozenge or tablet. Negative larynx.  Salivary glands: Negative  Thyroid: Small thyroid gland without mass lesion.  Lymph nodes: Right supraclavicular lymph nodes are enlarged with heterogeneous enhancement compatible with metastatic disease. Multiple lymph nodes are seen around the right jugular vein measuring up to 12 mm. Right supraclavicular node 15 mm.  Left supraclavicular lymph node measures 9 x 20 mm and is most consistent with metastatic disease. Left posterior superior axillary node measures 11 mm and appears to represent a pathologic node. Left level 2 node is 5 mm and is indeterminate. Small posterior lymph nodes are present on the left and are indeterminate. Right level 2 node measures 6 mm and is indeterminate.  Vascular: Jugular vein patent bilaterally. Carotid artery patent bilaterally.  Limited intracranial: Negative  Visualized orbits: Negative  Mastoids and visualized paranasal sinuses: Negative  Skeleton: Disc degeneration and spondylosis C5-6. No bony lesion identified.  Upper chest: Large right pleural effusion. Atelectasis in the right upper lobe.  IMPRESSION: Bilateral supraclavicular adenopathy compatible metastatic disease. Small level 2 and level 5 nodes are present bilaterally which could be due to metastatic disease as well.  Large right pleural effusion, worrisome for malignancy. PET is  recommended for further evaluation.   Electronically Signed   By: CFranchot GalloM.D.   On: 10/28/2014 15:48   Nm Pet Image Restag (ps) Skull Base To Thigh  11/06/2014   CLINICAL DATA:  Subsequent treatment strategy for breast cancer. Restaging examination. History of right-sided breast cancer status post right-sided modified radical mastectomy 11/18/2012.  EXAM: NUCLEAR MEDICINE PET SKULL BASE TO THIGH  TECHNIQUE: 8.8 mCi F-18 FDG was injected intravenously. Full-ring PET imaging was performed from the skull base to thigh after the radiotracer. CT data was obtained and used for attenuation correction and anatomic localization.  FASTING BLOOD GLUCOSE:  Value: 107 mg/dl  COMPARISON:  Chest CT 04/20/2014.  PET-CT 04/19/2012.  FINDINGS: NECK  Multiple borderline and mildly enlarged low cervical and supraclavicular lymph nodes which are markedly hypermetabolic bilaterally (right greater than left).  CHEST  Innumerable borderline enlarged and mildly enlarged hypermetabolic mediastinal and bilateral hilar lymph nodes are hypermetabolic. The right hilar nodal tissue is contiguous with parenchymal mass-like opacities involving the right lower lobe and to a lesser extent the right middle lobe. There are several air bronchograms throughout this region, however, given the general mass-like appearance of this area and the associated profound septal thickening which extends into the surrounding lung parenchyma and is somewhat nodular in appearance, this entire area is favored to be predominantly related to lymphangitic spread of tumor (in addition to some associated postobstructive changes), and this region is diffusely hypermetabolic (SUVmax = 189.2. Associated with this is a moderate right-sided pleural effusion, and there is some associated pleural hypermetabolism in the base of the right hemithorax (SUVmax = 10.0) indicative of a malignant pleural effusion. Multiple pulmonary nodules are noted in the lungs bilaterally,  which demonstrate hypermetabolism, compatible with additional metastatic foci.  ABDOMEN/PELVIS  Several areas of hypermetabolism are noted in the liver, but are not readily detectable on the CT images. The largest focus of hypermetabolism is in the superior aspect of segment 8 (SUVmax = 6.3). Gallbladder, pancreas, spleen, left adrenal gland and bilateral kidneys are unremarkable in appearance. There is a small focus of  hypermetabolism in the right adrenal gland (SUVmax = 10.3). In addition, there are several borderline enlarged upper abdominal lymph nodes, most evident in the celiac axis which are hypermetabolic (SUVmax = 6.9-6.2). Mild atherosclerosis throughout the abdominal and pelvic vasculature. No significant volume of ascites. No pneumoperitoneum. No pathologic distention of small bowel or colon. Uterus and ovaries are unremarkable in appearance. Status post right modified radical mastectomy and axillary lymph node dissection.  SKELETON  Multifocal skeletal hypermetabolism, compatible with widespread metastatic disease to the bones. Some of the larger lesions include diffuse hypermetabolism and subtle sclerosis in T9 vertebral body (SUVmax = 15.4), a subtly sclerotic hypermetabolic lesion in the inferior aspect of the sternum (SUVmax = 13.4), multiple rib lesions, including the posterior aspect of the left seventh rib, small hypermetabolic focus in the periphery of the left humeral head, and multiple sclerotic lesions in the ileum bilaterally, largest of which is on the right measuring approximately 1.9 cm (SUVmax = 19.0).  IMPRESSION: 1. Today's study demonstrates widespread metastatic disease throughout the neck, chest, abdomen and pelvis, with widespread pulmonary metastases, including a large volume of lymphangitic spread of disease in the right middle and lower lobes, a malignant right pleural effusion, multiple hepatic lesions, a right adrenal metastasis, numerous osseous lesions, and widespread  lymphadenopathy, as detailed above. These results were discussed by telephone at the time of interpretation on 11/06/2014 at 3:10 pm to Dr. Lurline Del, who verbally acknowledged these results.   Electronically Signed   By: Vinnie Langton M.D.   On: 11/06/2014 15:29   US Biopsy  11/06/2014   CLINICAL DATA:  49 year old female with a history of breast carcinoma, now with possible recurrence. She has been referred for percutaneous biopsy of cervical lymph node.  EXAM: ULTRASOUND GUIDED CORE BIOPSY OF CERVICAL LYMPH NODE  MEDICATIONS: 1.5 mg IV Versed; 75 mcg IV Fentanyl  Total Moderate Sedation Time: 14  PROCEDURE: The procedure, risks, benefits, and alternatives were explained to the patient. Questions regarding the procedure were encouraged and answered. The patient understands and consents to the procedure.  Ultrasound survey was performed with images stored and sent to PACs.  The right neck was prepped with Betadine in a sterile fashion, and a sterile drape was applied covering the operative field. A sterile gown and sterile gloves were used for the procedure. Local anesthesia was provided with 1% Lidocaine.  Ultrasound guidance was then used to infiltrate the skin and subcutaneous tissues with 1% lidocaine, and then a small stab incision was made at the base of the right neck with an 11 blade scalpel. A biopsy gun (18 gauge) was then advanced into the pathologic note at the base of the right neck, with 3 subsequent core biopsy achieved under ultrasound guidance.  The patient tolerated the procedure well and remained hemodynamically stable throughout.  No complications were encountered and no significant blood loss was encountered.  Small bandage was placed after the procedure.  Samples were placed into formalin for transportation to the lab.  Final image was stored after biopsy.  Patient tolerated the procedure well and remained hemodynamically stable throughout.  COMPLICATIONS: None.  FINDINGS:  Ultrasound survey demonstrates multiple abnormal appearing lymph nodes/nodules within the right supraclavicular region and cervical region.  Images during the case demonstrate placement of biopsy gun into right cervical/supraclavicular lymph node.  Final image demonstrates no complicating features.  IMPRESSION: Status post ultrasound-guided biopsy of right-sided cervical/ supraclavicular lymph nodes, with tissue specimen sent to pathology for complete histopathologic analysis.  Signed,  York Cerise  Pasty Arch, DO  [facility]  [facility]   Electronically Signed   By: Corrie Mckusick D.O.   On: 11/06/2014 14:23   Ir Fluoro Guide Cv Line Left  11/12/2014   CLINICAL DATA:  49 year old with metastatic breast cancer.  EXAM: FLUOROSCOPIC AND ULTRASOUND GUIDED PLACEMENT OF A SUBCUTANEOUS PORT.  Physician: Stephan Minister. Henn, MD  FLUOROSCOPY TIME:  2 minutes and 18 seconds.  18 mGy  MEDICATIONS AND MEDICAL HISTORY: Versed 4.0 mg, Fentanyl 50 mcg. A radiology nurse monitored the patient for moderate sedation.  As antibiotic prophylaxis, Ancef 2 gm was ordered pre-procedure and administered intravenously within one hour of incision.  ANESTHESIA/SEDATION: Moderate sedation time: 47 minutes  PROCEDURE: The risks of the procedure were explained to the patient. Informed consent was obtained. Patient was placed supine on the interventional table. Ultrasound confirmed a patent left internal jugular vein. The left chest and neck were cleaned with a skin antiseptic and a sterile drape was placed. Maximal barrier sterile technique was utilized including caps, mask, sterile gowns, sterile gloves, sterile drape, hand hygiene and skin antiseptic. The left neck was anesthetized with 1% lidocaine. Small incision was made in the left neck with a blade. Micropuncture set was placed in the left IJ with ultrasound guidance. The left chest was anesthetized with 1% lidocaine with epinephrine. #15 blade was  used to make an incision and a subcutaneous port pocket was formed. Medley was selected. Subcutaneous tunnel was formed with a stiff tunneling device. The port catheter was brought through the subcutaneous tunnel. The micropuncture set was exchanged for a peel-away sheath. The catheter was placed through the peel-away sheath and the tip was positioned at the superior cavoatrial junction. Catheter was attached to the port and placed within the subcutaneous pocket. Catheter placement was confirmed with fluoroscopy. The port was accessed and flushed with heparinized saline. The port pocket was closed using two layers of absorbable sutures and Dermabond. The vein skin site was closed using a single layer of absorbable suture and Dermabond. Sterile dressings were applied. Patient tolerated the procedure well without an immediate complication. Ultrasound and fluoroscopic images were taken and saved for this procedure.  Estimated blood loss: Minimal  COMPLICATIONS: None  IMPRESSION: Placement of a subcutaneous port device. Catheter tip at the superior cavoatrial junction and ready to be used.   Electronically Signed   By: Markus Daft M.D.   On: 11/12/2014 16:00   Ir US Guide Vasc Access Left  11/12/2014   CLINICAL DATA:  49 year old with metastatic breast cancer.  EXAM: FLUOROSCOPIC AND ULTRASOUND GUIDED PLACEMENT OF A SUBCUTANEOUS PORT.  Physician: Stephan Minister. Henn, MD  FLUOROSCOPY TIME:  2 minutes and 18 seconds.  18 mGy  MEDICATIONS AND MEDICAL HISTORY: Versed 4.0 mg, Fentanyl 50 mcg. A radiology nurse monitored the patient for moderate sedation.  As antibiotic prophylaxis, Ancef 2 gm was ordered pre-procedure and administered intravenously within one hour of incision.  ANESTHESIA/SEDATION: Moderate sedation time: 47 minutes  PROCEDURE: The risks of the procedure were explained to the patient. Informed consent was obtained. Patient was placed supine on the interventional table. Ultrasound confirmed a patent  left internal jugular vein. The left chest and neck were cleaned with a skin antiseptic and a sterile drape was placed. Maximal barrier sterile technique was utilized including caps, mask, sterile gowns, sterile gloves, sterile drape, hand hygiene and skin antiseptic. The left neck was anesthetized with 1% lidocaine. Small incision was made in the left  neck with a blade. Micropuncture set was placed in the left IJ with ultrasound guidance. The left chest was anesthetized with 1% lidocaine with epinephrine. #15 blade was used to make an incision and a subcutaneous port pocket was formed. Anton[place] was selected. Subcutaneous tunnel was formed with a stiff tunneling device. The port catheter was brought through the subcutaneous tunnel. The micropuncture set was exchanged for a peel-away sheath. The catheter was placed through the peel-away sheath and the tip was positioned at the superior cavoatrial junction. Catheter was attached to the port and placed within the subcutaneous pocket. Catheter placement was confirmed with fluoroscopy. The port was accessed and flushed with heparinized saline. The port pocket was closed using two layers of absorbable sutures and Dermabond. The vein skin site was closed using a single layer of absorbable suture and Dermabond. Sterile dressings were applied. Patient tolerated the procedure well without an immediate complication. Ultrasound and fluoroscopic images were taken and saved for this procedure.  Estimated blood loss: Minimal  COMPLICATIONS: None  IMPRESSION: Placement of a subcutaneous port device. Catheter tip at the superior cavoatrial junction and ready to be used.   Electronically Signed   By: Markus Daft M.D.   On: 11/12/2014 16:00   US Thoracentesis Asp Pleural Space W/img Guide  10/29/2014   INDICATION: Patient with history of right breast cancer; now with some dyspnea with exertion and right pleural effusion. Request is made for diagnostic and therapeutic right  thoracentesis.  EXAM: ULTRASOUND GUIDED DIAGNOSTIC AND THERAPEUTIC RIGHT THORACENTESIS  COMPARISON:  None.  MEDICATIONS: None  COMPLICATIONS: None immediate  TECHNIQUE: Informed written consent was obtained from the patient after a discussion of the risks, benefits and alternatives to treatment. A timeout was performed prior to the initiation of the procedure.  Initial ultrasound scanning demonstrates a moderate to large right pleural effusion. The lower chest was prepped and draped in the usual sterile fashion. 1% lidocaine was used for local anesthesia.  An ultrasound image was saved for documentation purposes. A 6 Fr Safe-T-Centesis catheter was introduced. The thoracentesis was performed. The catheter was removed and a dressing was applied. The patient tolerated the procedure well without immediate post procedural complication. The patient was escorted to have an upright chest radiograph.  FINDINGS: A total of approximately 1 liter of hazy, yellow fluid was removed. Requested samples were sent to the laboratory.  IMPRESSION: Successful ultrasound-guided diagnostic and therapeutic right sided thoracentesis yielding 1 liter of pleural fluid.  Read by: Rowe Robert, PA-C   Electronically Signed   By: Lucrezia Europe M.D.   On: 10/29/2014 15:39    ASSESSMENT: 49 y.o. BRCA negative [place] woman with triple-negative stage IV breast cancer  (1) an status post right breast upper outer quadrant and right axillary lymph node biopsy 04/04/2012, both positive for an invasive ductal carcinoma, grade 3, triple negative, with an MIB-1 of 25%  (2) Treated neoadjuvantly with  (a) fluorouracil, cyclophosphamide, and epirubicin (at 100 mg/M2) x4 completed 06/07/2012  (b) docetaxel (75 mg/M2) for one dose, 06/21/2012, poorly tolerated  (c) carboplatin and gemcitabine given every 21 days for 6 cycles completed 10/11/2012  (3) status post right modified radical mastectomy 11/18/2012 showing a complete pathologic  response--all 16 lymph nodes were benign  (a) no plans for reconstructon  (4) adjuvant radiation therapy completed 03/06/2013  METASTATIC DISEASE June 2016 (5) 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 I am not making any changes. We discussed Neulasta versus onpro and she would like to give the onpro to try so I am entering those orders now. This will be important to keep her treatments on time. Her white cells are already dropping.  They have started at discussions with their children, who are however mostly out at [facility] this week. I gave him information on the [facility] program and they will be contacting them.  Lashanda'ssister-in-law Chalmers Guest is very interested in clinical trials. She works at [employer] with opposed Dr. in pharmacology. She can be reached at [phone]. They would like me to discuss [place] situation with her. She also suggested [name] be evaluated at [facility] and I will see if Dr. Philipp Ovens can worker in sometime mid July.    Chauncey Cruel, MD   11/13/2014 10:10 AM

## 2014-11-16 ENCOUNTER — Ambulatory Visit (HOSPITAL_BASED_OUTPATIENT_CLINIC_OR_DEPARTMENT_OTHER): Payer: BLUE CROSS/BLUE SHIELD

## 2014-11-16 ENCOUNTER — Ambulatory Visit: Payer: BLUE CROSS/BLUE SHIELD

## 2014-11-16 ENCOUNTER — Ambulatory Visit (HOSPITAL_COMMUNITY)
Admission: RE | Admit: 2014-11-16 | Discharge: 2014-11-16 | Disposition: A | Discharge status: Home / Self Care | Payer: BLUE CROSS/BLUE SHIELD | Source: Ambulatory Visit | Attending: Oncology | Admitting: Oncology

## 2014-11-16 ENCOUNTER — Ambulatory Visit
Admission: RE | Admit: 2014-11-16 | Discharge: 2014-11-16 | Disposition: A | Discharge status: Home / Self Care | Payer: BLUE CROSS/BLUE SHIELD | Source: Ambulatory Visit | Attending: Radiation Oncology | Admitting: Radiation Oncology

## 2014-11-16 ENCOUNTER — Other Ambulatory Visit: Payer: Self-pay | Admitting: Radiation Therapy

## 2014-11-16 ENCOUNTER — Other Ambulatory Visit: Payer: Self-pay | Admitting: Oncology

## 2014-11-16 ENCOUNTER — Other Ambulatory Visit (HOSPITAL_BASED_OUTPATIENT_CLINIC_OR_DEPARTMENT_OTHER): Payer: BLUE CROSS/BLUE SHIELD

## 2014-11-16 VITALS — BP 130/60 | HR 94 | Temp 98.6°F | Resp 20

## 2014-11-16 DIAGNOSIS — Z5111 Encounter for antineoplastic chemotherapy: Secondary | ICD-10-CM | POA: Diagnosis not present

## 2014-11-16 DIAGNOSIS — C7951 Secondary malignant neoplasm of bone: Secondary | ICD-10-CM

## 2014-11-16 DIAGNOSIS — C50911 Malignant neoplasm of unspecified site of right female breast: Secondary | ICD-10-CM | POA: Diagnosis present

## 2014-11-16 DIAGNOSIS — Z08 Encounter for follow-up examination after completed treatment for malignant neoplasm: Secondary | ICD-10-CM | POA: Diagnosis present

## 2014-11-16 DIAGNOSIS — Z5189 Encounter for other specified aftercare: Secondary | ICD-10-CM

## 2014-11-16 DIAGNOSIS — C7931 Secondary malignant neoplasm of brain: Secondary | ICD-10-CM | POA: Insufficient documentation

## 2014-11-16 DIAGNOSIS — C50411 Malignant neoplasm of upper-outer quadrant of right female breast: Secondary | ICD-10-CM | POA: Diagnosis not present

## 2014-11-16 DIAGNOSIS — Z510 Encounter for antineoplastic radiation therapy: Secondary | ICD-10-CM | POA: Insufficient documentation

## 2014-11-16 DIAGNOSIS — Z95828 Presence of other vascular implants and grafts: Secondary | ICD-10-CM

## 2014-11-16 DIAGNOSIS — C50919 Malignant neoplasm of unspecified site of unspecified female breast: Secondary | ICD-10-CM

## 2014-11-16 LAB — CBC WITH DIFFERENTIAL/PLATELET
BASO%: 0.9 % (ref 0.0–2.0)
Basophils Absolute: 0 10*3/uL (ref 0.0–0.1)
EOS%: 1.6 % (ref 0.0–7.0)
Eosinophils Absolute: 0.1 10*3/uL (ref 0.0–0.5)
HCT: 38.9 % (ref 34.8–46.6)
HEMOGLOBIN: 13.1 g/dL (ref 11.6–15.9)
LYMPH%: 20.3 % (ref 14.0–49.7)
MCH: 34 pg (ref 25.1–34.0)
MCHC: 33.6 g/dL (ref 31.5–36.0)
MCV: 101.4 fL — ABNORMAL HIGH (ref 79.5–101.0)
MONO#: 0.2 10*3/uL (ref 0.1–0.9)
MONO%: 6.2 % (ref 0.0–14.0)
NEUT#: 2.5 10*3/uL (ref 1.5–6.5)
NEUT%: 71 % (ref 38.4–76.8)
Platelets: 242 10*3/uL (ref 145–400)
RBC: 3.84 10*6/uL (ref 3.70–5.45)
RDW: 13 % (ref 11.2–14.5)
WBC: 3.5 10*3/uL — ABNORMAL LOW (ref 3.9–10.3)
lymph#: 0.7 10*3/uL — ABNORMAL LOW (ref 0.9–3.3)

## 2014-11-16 LAB — COMPREHENSIVE METABOLIC PANEL (CC13)
ALK PHOS: 102 U/L (ref 40–150)
ALT: 17 U/L (ref 0–55)
AST: 18 U/L (ref 5–34)
Albumin: 3.2 g/dL — ABNORMAL LOW (ref 3.5–5.0)
Anion Gap: 7 mEq/L (ref 3–11)
BILIRUBIN TOTAL: 0.48 mg/dL (ref 0.20–1.20)
BUN: 8.7 mg/dL (ref 7.0–26.0)
CHLORIDE: 103 meq/L (ref 98–109)
CO2: 31 mEq/L — ABNORMAL HIGH (ref 22–29)
Calcium: 8.9 mg/dL (ref 8.4–10.4)
Creatinine: 0.7 mg/dL (ref 0.6–1.1)
GLUCOSE: 95 mg/dL (ref 70–140)
Potassium: 4 mEq/L (ref 3.5–5.1)
Sodium: 141 mEq/L (ref 136–145)
TOTAL PROTEIN: 6.3 g/dL — AB (ref 6.4–8.3)

## 2014-11-16 MED ORDER — SODIUM CHLORIDE 0.9 % IV SOLN
Freq: Once | INTRAVENOUS | Status: AC
  Administered 2014-11-16: 14:00:00 via INTRAVENOUS
  Filled 2014-11-16: qty 4

## 2014-11-16 MED ORDER — PACLITAXEL PROTEIN-BOUND CHEMO INJECTION 100 MG
100.0000 mg/m2 | Freq: Once | INTRAVENOUS | Status: AC
  Administered 2014-11-16: 200 mg via INTRAVENOUS
  Filled 2014-11-16: qty 40

## 2014-11-16 MED ORDER — PEGFILGRASTIM 6 MG/0.6ML ~~LOC~~ PSKT
6.0000 mg | PREFILLED_SYRINGE | Freq: Once | SUBCUTANEOUS | Status: AC
  Administered 2014-11-16: 6 mg via SUBCUTANEOUS
  Filled 2014-11-16: qty 0.6

## 2014-11-16 MED ORDER — SODIUM CHLORIDE 0.9 % IJ SOLN
10.0000 mL | INTRAMUSCULAR | Status: DC | PRN
  Administered 2014-11-16: 10 mL
  Filled 2014-11-16: qty 10

## 2014-11-16 MED ORDER — SODIUM CHLORIDE 0.9 % IV SOLN
Freq: Once | INTRAVENOUS | Status: AC
  Administered 2014-11-16: 13:00:00 via INTRAVENOUS

## 2014-11-16 MED ORDER — SODIUM CHLORIDE 0.9 % IJ SOLN
10.0000 mL | INTRAMUSCULAR | Status: DC | PRN
  Administered 2014-11-16: 10 mL via INTRAVENOUS
  Filled 2014-11-16: qty 10

## 2014-11-16 MED ORDER — ZOLEDRONIC ACID 4 MG/100ML IV SOLN
4.0000 mg | Freq: Once | INTRAVENOUS | Status: AC
  Administered 2014-11-16: 4 mg via INTRAVENOUS
  Filled 2014-11-16: qty 100

## 2014-11-16 MED ORDER — GADOBENATE DIMEGLUMINE 529 MG/ML IV SOLN
20.0000 mL | Freq: Once | INTRAVENOUS | Status: AC | PRN
  Administered 2014-11-16: 17 mL via INTRAVENOUS

## 2014-11-16 MED ORDER — HEPARIN SOD (PORK) LOCK FLUSH 100 UNIT/ML IV SOLN
500.0000 [IU] | Freq: Once | INTRAVENOUS | Status: AC | PRN
  Administered 2014-11-16: 500 [IU]
  Filled 2014-11-16: qty 5

## 2014-11-16 NOTE — Progress Notes (Unsigned)
I was called by Dr. 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 I informed the patient of this development and gave her a copy of the MRI. I also discussed the case with Dr. Eppie Gibson. Dr. Isidore Moos has agreed to meet with the patient today since the patient is a ready here to receive her intravenous treatment. Nicole Valentine's case will be presented at the brain cancer conference June 29 and then again after she has the finer cuts MRI to show other possible lesions and to rule out lymphangitic spread of disease.

## 2014-11-16 NOTE — Patient Instructions (Signed)

## 2014-11-16 NOTE — Progress Notes (Addendum)
Radiation Oncology         [phone] ________________________________  Outpatient Re-Consultation  Name: Nicole Valentine MRN: [id number]  Date: 11/16/2014  DOB: Oct 04, 1965  FB:PZWCH,ENIDPOE Weyman Croon, MD  Carol Ada, MD   REFERRING PHYSICIAN: GUS [name] MD  DIAGNOSIS: The encounter diagnosis was Brain metastases.  HISTORY OF PRESENT ILLNESS::Nicole Valentine is a 49 y.o. female who  Is well known by Dr [name] due to prior history of locally advanced breast cancer, triple negative.  Unfortunately, a recent PET revealed interim development of lung, liver, adrenal and bone metastases.  MRI to stage her brain reveals at least 2 sub centimeter cerebellar metastases. She is asymptomatic from her brain disease.  She has a beach vacation in <1 week.   No HA, dizziness, coordination issues, seizures, or neuro deficits.  PREVIOUS RADIATION THERAPY: Yes 61 Gray to the right chest wall and supraclavicular fossa completed 03/06/2013  PAST MEDICAL HISTORY:  has a past medical history of Complication of anesthesia; Hypothyroidism; Allergy; Radiation (01/21/13-03/06/13); and Breast cancer (dx'd 04-10-12-rt).    PAST SURGICAL HISTORY: Past Surgical History  Procedure Laterality Date  . Wisdom tooth extraction    . Portacath placement  04/22/2012    Procedure: INSERTION PORT-A-CATH;  Surgeon: [name], MD;  Location: [place];  Service: General;  Laterality: Left;  [name] Modified mastectomy Right 11/18/2012    Procedure:  RIGHT MODIFIED MASTECTOMY;  Surgeon: [name], MD;  Location: [place];  Service: General;  Laterality: Right;  . Knee arthroscopy Right 11/26/2013  . Port-a-cath removal Left 12/10/2013    Procedure: MINOR REMOVAL PORT-A-CATH;  Surgeon: [name], MD;  Location: [place];  Service: General;  Laterality: Left;    FAMILY HISTORY: family history includes Brain cancer (age of onset: 10) in her cousin; Liver  cancer in her other; Lung cancer (age of onset: 55) in her maternal grandfather; Melanoma (age of onset: 18) in her maternal uncle; Prostate cancer (age of onset: 6) in her maternal grandfather; Throat cancer in her other.  SOCIAL HISTORY:  reports that she has never smoked. She has never used smokeless tobacco. She reports that she does not drink alcohol or use illicit drugs.  ALLERGIES: Almond meal and Other  MEDICATIONS:  Current Outpatient Prescriptions  Medication Sig Dispense Refill  . Ascorbic Acid (VITAMIN C PO) Take 1 tablet by mouth at bedtime.     [name] azithromycin (ZITHROMAX) 250 MG tablet Take as directed 6 each 0  . cholecalciferol (VITAMIN D) 1000 UNITS tablet Take 2,000 Units by mouth 2 (two) times daily.     . fexofenadine (ALLEGRA) 180 MG tablet Take 180 mg by mouth daily as needed for allergies or rhinitis.    [name] ibuprofen (ADVIL,MOTRIN) 200 MG tablet Take 400 mg by mouth every 6 (six) hours as needed for headache or moderate pain.    [name] levothyroxine (SYNTHROID, LEVOTHROID) 175 MCG tablet Take 175 mcg by mouth daily before breakfast.    . lidocaine-prilocaine (EMLA) cream Apply to affected area once 30 g 3  . LORazepam (ATIVAN) 0.5 MG tablet Take 1 tablet (0.5 mg total) by mouth at bedtime as needed (Nausea or vomiting). 30 tablet 0  . ondansetron (ZOFRAN) 8 MG tablet Take 1 tablet (8 mg total) by mouth 2 (two) times daily. Start the day after chemo for 2 days. Then take as needed for nausea or vomiting. 30 tablet 1  . prochlorperazine (COMPAZINE) 10 MG tablet Take 1 tablet (10 mg  total) by mouth every 6 (six) hours as needed (Nausea or vomiting). 30 tablet 1   No current facility-administered medications for this encounter.   Facility-Administered Medications Ordered in Other Encounters  Medication Dose Route Frequency Provider Last Rate Last Dose  . sodium chloride 0.9 % injection 10 mL  10 mL Intracatheter PRN Chauncey Cruel, MD   10 mL at 11/16/14 1501    REVIEW OF  SYSTEMS:  As above  PHYSICAL EXAM:   Vitals with Age-Percentiles 08/12/5571  Length   Systolic 220  Diastolic 60  MAP   Pulse 94  Respiration 20  Weight   BMI   VISIT REPORT    General: Alert and oriented, in no acute distress HEENT: Head is normocephalic. Extraocular movements are intact. Oropharynx is clear. Neck: + left PAC. Heart: Regular in rate and rhythm with no murmurs, rubs, or gallops. Chest: Clear to auscultation bilaterally, with no rhonchi, wheezes, or rales. Abdomen: Soft, nontender, nondistended, with no rigidity or guarding. Extremities: No cyanosis or edema. Skin: No concerning lesions. Musculoskeletal: symmetric strength and muscle tone throughout. Neurologic: Cranial nerves II through XII are grossly intact. No obvious focalities. Speech is fluent. Coordination is intact. Psychiatric: Judgment and insight are intact. Affect is appropriate.    LABORATORY DATA:  Lab Results  Component Value Date   WBC 3.5* 11/16/2014   HGB 13.1 11/16/2014   HCT 38.9 11/16/2014   MCV 101.4* 11/16/2014   PLT 242 11/16/2014   CMP     Component Value Date/Time   NA 141 11/16/2014 1224   NA 139 11/12/2014 1310   K 4.0 11/16/2014 1224   K 3.5 11/12/2014 1310   CL 100* 11/12/2014 1310   CL 101 10/18/2012 0859   CO2 31* 11/16/2014 1224   CO2 28 11/12/2014 1310   GLUCOSE 95 11/16/2014 1224   GLUCOSE 105* 11/12/2014 1310   GLUCOSE 83 10/18/2012 0859   BUN 8.7 11/16/2014 1224   BUN 11 11/12/2014 1310   CREATININE 0.7 11/16/2014 1224   CREATININE 0.59 11/12/2014 1310   CALCIUM 8.9 11/16/2014 1224   CALCIUM 8.9 11/12/2014 1310   PROT 6.3* 11/16/2014 1224   ALBUMIN 3.2* 11/16/2014 1224   AST 18 11/16/2014 1224   ALT 17 11/16/2014 1224   ALKPHOS 102 11/16/2014 1224   BILITOT 0.48 11/16/2014 1224   GFRNONAA >60 11/12/2014 1310   GFRAA >60 11/12/2014 1310         RADIOGRAPHY: Dg Chest 1 View  10/29/2014   CLINICAL DATA:  Post thoracentesis chest radiograph.  Cough.   EXAM: CHEST  1 VIEW  COMPARISON:  CT 10/28/2014.  FINDINGS: No pneumothorax. Small RIGHT pleural effusion with consolidation at the RIGHT base, probably representing atelectatic lung in the setting of previously seen large effusion. No LEFT pleural effusion. RIGHT axillary dissection clips present.  IMPRESSION: 1. No pneumothorax status post RIGHT thoracentesis. 2. Residual collapse/consolidation of the RIGHT base.   Electronically Signed   By: Dereck Ligas M.D.   On: 10/29/2014 15:38   Ct Head W Wo Contrast  10/28/2014   CLINICAL DATA:  Breast cancer.  Visual changes.  EXAM: CT HEAD WITHOUT AND WITH CONTRAST  TECHNIQUE: Contiguous axial images were obtained from the base of the skull through the vertex without and with intravenous contrast  CONTRAST:  135mL OMNIPAQUE IOHEXOL 300 MG/ML  SOLN  COMPARISON:  None.  FINDINGS: Ventricle size normal. Negative for acute or chronic ischemia. Negative for mass or edema. Negative for hemorrhage  Normal enhancement  following contrast infusion.  Skull is normal.  IMPRESSION: Negative   Electronically Signed   By: Franchot Gallo M.D.   On: 10/28/2014 15:33   Ct Soft Tissue Neck W Contrast  10/28/2014   CLINICAL DATA:  Breast cancer. Small palpable lymph nodes right supraclavicular region.  EXAM: CT NECK WITH CONTRAST  TECHNIQUE: Multidetector CT imaging of the neck was performed using the standard protocol following the bolus administration of intravenous contrast.  CONTRAST:  128mL OMNIPAQUE IOHEXOL 300 MG/ML  SOLN  COMPARISON:  PET-CT 04/19/2012  FINDINGS: Pharynx and larynx: Negative. Well-defined density lateral to the right molars consistent with a lozenge or tablet. Negative larynx.  Salivary glands: Negative  Thyroid: Small thyroid gland without mass lesion.  Lymph nodes: Right supraclavicular lymph nodes are enlarged with heterogeneous enhancement compatible with metastatic disease. Multiple lymph nodes are seen around the right jugular vein measuring up to 12 mm.  Right supraclavicular node 15 mm.  Left supraclavicular lymph node measures 9 x 20 mm and is most consistent with metastatic disease. Left posterior superior axillary node measures 11 mm and appears to represent a pathologic node. Left level 2 node is 5 mm and is indeterminate. Small posterior lymph nodes are present on the left and are indeterminate. Right level 2 node measures 6 mm and is indeterminate.  Vascular: Jugular vein patent bilaterally. Carotid artery patent bilaterally.  Limited intracranial: Negative  Visualized orbits: Negative  Mastoids and visualized paranasal sinuses: Negative  Skeleton: Disc degeneration and spondylosis C5-6. No bony lesion identified.  Upper chest: Large right pleural effusion. Atelectasis in the right upper lobe.  IMPRESSION: Bilateral supraclavicular adenopathy compatible metastatic disease. Small level 2 and level 5 nodes are present bilaterally which could be due to metastatic disease as well.  Large right pleural effusion, worrisome for malignancy. PET is recommended for further evaluation.   Electronically Signed   By: Franchot Gallo M.D.   On: 10/28/2014 15:48   Mr Jeri Cos NL Contrast  11/16/2014   CLINICAL DATA:  Breast cancer. Staging. No reported neurologic symptoms.  EXAM: MRI HEAD WITHOUT AND WITH CONTRAST  TECHNIQUE: Multiplanar, multiecho pulse sequences of the brain and surrounding structures were obtained without and with intravenous contrast.  CONTRAST:  MultiHance 17 mL.  COMPARISON:  CT head 10/28/2014.  FINDINGS: No acute stroke, acute hemorrhage, hydrocephalus, or extra-axial fluid. Normal cerebral volume. No significant white matter disease.  No definite osseous metastatic disease. Upper cervical region unremarkable. Trapped fluid in the RIGHT petrous apex, non worrisome.  Post infusion, two metastatic lesions are identified. The larger lesion is in the RIGHT cerebellar tonsil, 4 mm diameter, adjacent to the foramen of Luschka (image 13 series 11). A  smaller lesion is seen in the RIGHT lateral cerebellum, 3 mm diameter, (image 19 series 11), adjacent to a superiorly draining cerebellar developmental venous anomaly. No mass effect or significant surrounding edema. No other definite intracranial metastatic lesions are seen. Incidental additional second DVA RIGHT medial frontal cortex, seen on image 41 series 11.  Major dural venous sinuses are patent. Negative orbits. No sinus air-fluid level. 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 No significant edema or mass effect from either.  Findings discussed with ordering provider.   Electronically Signed   By: Staci Righter M.D.   On: 11/16/2014 09:08   Nm Pet Image Restag (ps) Skull Base To Thigh  11/06/2014  CLINICAL DATA:  Subsequent treatment strategy for breast cancer. Restaging examination. History of right-sided breast cancer status post right-sided modified radical mastectomy 11/18/2012.  EXAM: NUCLEAR MEDICINE PET SKULL BASE TO THIGH  TECHNIQUE: 8.8 mCi F-18 FDG was injected intravenously. Full-ring PET imaging was performed from the skull base to thigh after the radiotracer. CT data was obtained and used for attenuation correction and anatomic localization.  FASTING BLOOD GLUCOSE:  Value: 107 mg/dl  COMPARISON:  Chest CT 04/20/2014.  PET-CT 04/19/2012.  FINDINGS: NECK  Multiple borderline and mildly enlarged low cervical and supraclavicular lymph nodes which are markedly hypermetabolic bilaterally (right greater than left).  CHEST  Innumerable borderline enlarged and mildly enlarged hypermetabolic mediastinal and bilateral hilar lymph nodes are hypermetabolic. The right hilar nodal tissue is contiguous with parenchymal mass-like opacities involving the right lower lobe and to a lesser extent the right middle lobe. There are several air bronchograms throughout this  region, however, given the general mass-like appearance of this area and the associated profound septal thickening which extends into the surrounding lung parenchyma and is somewhat nodular in appearance, this entire area is favored to be predominantly related to lymphangitic spread of tumor (in addition to some associated postobstructive changes), and this region is diffusely hypermetabolic (SUVmax = 32.2). Associated with this is a moderate right-sided pleural effusion, and there is some associated pleural hypermetabolism in the base of the right hemithorax (SUVmax = 10.0) indicative of a malignant pleural effusion. Multiple pulmonary nodules are noted in the lungs bilaterally, which demonstrate hypermetabolism, compatible with additional metastatic foci.  ABDOMEN/PELVIS  Several areas of hypermetabolism are noted in the liver, but are not readily detectable on the CT images. The largest focus of hypermetabolism is in the superior aspect of segment 8 (SUVmax = 6.3). Gallbladder, pancreas, spleen, left adrenal gland and bilateral kidneys are unremarkable in appearance. There is a small focus of hypermetabolism in the right adrenal gland (SUVmax = 10.3). In addition, there are several borderline enlarged upper abdominal lymph nodes, most evident in the celiac axis which are hypermetabolic (SUVmax = 0.2-5.4). Mild atherosclerosis throughout the abdominal and pelvic vasculature. No significant volume of ascites. No pneumoperitoneum. No pathologic distention of small bowel or colon. Uterus and ovaries are unremarkable in appearance. Status post right modified radical mastectomy and axillary lymph node dissection.  SKELETON  Multifocal skeletal hypermetabolism, compatible with widespread metastatic disease to the bones. Some of the larger lesions include diffuse hypermetabolism and subtle sclerosis in T9 vertebral body (SUVmax = 15.4), a subtly sclerotic hypermetabolic lesion in the inferior aspect of the sternum  (SUVmax = 13.4), multiple rib lesions, including the posterior aspect of the left seventh rib, small hypermetabolic focus in the periphery of the left humeral head, and multiple sclerotic lesions in the ileum bilaterally, largest of which is on the right measuring approximately 1.9 cm (SUVmax = 19.0).  IMPRESSION: 1. Today's study demonstrates widespread metastatic disease throughout the neck, chest, abdomen and pelvis, with widespread pulmonary metastases, including a large volume of lymphangitic spread of disease in the right middle and lower lobes, a malignant right pleural effusion, multiple hepatic lesions, a right adrenal metastasis, numerous osseous lesions, and widespread lymphadenopathy, as detailed above. These results were discussed by telephone at the time of interpretation on 11/06/2014 at 3:10 pm to Dr. Lurline Del, who verbally acknowledged these results.   Electronically Signed   By: Vinnie Langton M.D.   On: 11/06/2014 15:29   US Biopsy  11/06/2014   CLINICAL DATA:  49 year old female  with a history of breast carcinoma, now with possible recurrence. She has been referred for percutaneous biopsy of cervical lymph node.  EXAM: ULTRASOUND GUIDED CORE BIOPSY OF CERVICAL LYMPH NODE  MEDICATIONS: 1.5 mg IV Versed; 75 mcg IV Fentanyl  Total Moderate Sedation Time: 14  PROCEDURE: The procedure, risks, benefits, and alternatives were explained to the patient. Questions regarding the procedure were encouraged and answered. The patient understands and consents to the procedure.  Ultrasound survey was performed with images stored and sent to PACs.  The right neck was prepped with Betadine in a sterile fashion, and a sterile drape was applied covering the operative field. A sterile gown and sterile gloves were used for the procedure. Local anesthesia was provided with 1% Lidocaine.  Ultrasound guidance was then used to infiltrate the skin and subcutaneous tissues with 1% lidocaine, and then a small stab  incision was made at the base of the right neck with an 11 blade scalpel. A biopsy gun (18 gauge) was then advanced into the pathologic note at the base of the right neck, with 3 subsequent core biopsy achieved under ultrasound guidance.  The patient tolerated the procedure well and remained hemodynamically stable throughout.  No complications were encountered and no significant blood loss was encountered.  Small bandage was placed after the procedure.  Samples were placed into formalin for transportation to the lab.  Final image was stored after biopsy.  Patient tolerated the procedure well and remained hemodynamically stable throughout.  COMPLICATIONS: None.  FINDINGS: Ultrasound survey demonstrates multiple abnormal appearing lymph nodes/nodules within the right supraclavicular region and cervical region.  Images during the case demonstrate placement of biopsy gun into right cervical/supraclavicular lymph node.  Final image demonstrates no complicating features.  IMPRESSION: Status post ultrasound-guided biopsy of right-sided cervical/ supraclavicular lymph nodes, with tissue specimen sent to pathology for complete histopathologic analysis.  Signed,  Dulcy Fanny. Earleen Newport, DO  [facility]  [facility]   Electronically Signed   By: Corrie Mckusick D.O.   On: 11/06/2014 14:23   Ir Fluoro Guide Cv Line Left  11/12/2014   CLINICAL DATA:  48 year old with metastatic breast cancer.  EXAM: FLUOROSCOPIC AND ULTRASOUND GUIDED PLACEMENT OF A SUBCUTANEOUS PORT.  Physician: Stephan Minister. Henn, MD  FLUOROSCOPY TIME:  2 minutes and 18 seconds.  18 mGy  MEDICATIONS AND MEDICAL HISTORY: Versed 4.0 mg, Fentanyl 50 mcg. A radiology nurse monitored the patient for moderate sedation.  As antibiotic prophylaxis, Ancef 2 gm was ordered pre-procedure and administered intravenously within one hour of incision.  ANESTHESIA/SEDATION: Moderate sedation time: 47 minutes  PROCEDURE: The risks of the  procedure were explained to the patient. Informed consent was obtained. Patient was placed supine on the interventional table. Ultrasound confirmed a patent left internal jugular vein. The left chest and neck were cleaned with a skin antiseptic and a sterile drape was placed. Maximal barrier sterile technique was utilized including caps, mask, sterile gowns, sterile gloves, sterile drape, hand hygiene and skin antiseptic. The left neck was anesthetized with 1% lidocaine. Small incision was made in the left neck with a blade. Micropuncture set was placed in the left IJ with ultrasound guidance. The left chest was anesthetized with 1% lidocaine with epinephrine. #15 blade was used to make an incision and a subcutaneous port pocket was formed. [place] was selected. Subcutaneous tunnel was formed with a stiff tunneling device. The port catheter was brought through the subcutaneous tunnel. The micropuncture set was exchanged for  a peel-away sheath. The catheter was placed through the peel-away sheath and the tip was positioned at the superior cavoatrial junction. Catheter was attached to the port and placed within the subcutaneous pocket. Catheter placement was confirmed with fluoroscopy. The port was accessed and flushed with heparinized saline. The port pocket was closed using two layers of absorbable sutures and Dermabond. The vein skin site was closed using a single layer of absorbable suture and Dermabond. Sterile dressings were applied. Patient tolerated the procedure well without an immediate complication. Ultrasound and fluoroscopic images were taken and saved for this procedure.  Estimated blood loss: Minimal  COMPLICATIONS: None  IMPRESSION: Placement of a subcutaneous port device. Catheter tip at the superior cavoatrial junction and ready to be used.   Electronically Signed   By: Markus Daft M.D.   On: 11/12/2014 16:00   Ir US Guide Vasc Access Left  11/12/2014   CLINICAL DATA:  49 year old with  metastatic breast cancer.  EXAM: FLUOROSCOPIC AND ULTRASOUND GUIDED PLACEMENT OF A SUBCUTANEOUS PORT.  Physician: Stephan Minister. Henn, MD  FLUOROSCOPY TIME:  2 minutes and 18 seconds.  18 mGy  MEDICATIONS AND MEDICAL HISTORY: Versed 4.0 mg, Fentanyl 50 mcg. A radiology nurse monitored the patient for moderate sedation.  As antibiotic prophylaxis, Ancef 2 gm was ordered pre-procedure and administered intravenously within one hour of incision.  ANESTHESIA/SEDATION: Moderate sedation time: 47 minutes  PROCEDURE: The risks of the procedure were explained to the patient. Informed consent was obtained. Patient was placed supine on the interventional table. Ultrasound confirmed a patent left internal jugular vein. The left chest and neck were cleaned with a skin antiseptic and a sterile drape was placed. Maximal barrier sterile technique was utilized including caps, mask, sterile gowns, sterile gloves, sterile drape, hand hygiene and skin antiseptic. The left neck was anesthetized with 1% lidocaine. Small incision was made in the left neck with a blade. Micropuncture set was placed in the left IJ with ultrasound guidance. The left chest was anesthetized with 1% lidocaine with epinephrine. #15 blade was used to make an incision and a subcutaneous port pocket was formed. [place] was selected. Subcutaneous tunnel was formed with a stiff tunneling device. The port catheter was brought through the subcutaneous tunnel. The micropuncture set was exchanged for a peel-away sheath. The catheter was placed through the peel-away sheath and the tip was positioned at the superior cavoatrial junction. Catheter was attached to the port and placed within the subcutaneous pocket. Catheter placement was confirmed with fluoroscopy. The port was accessed and flushed with heparinized saline. The port pocket was closed using two layers of absorbable sutures and Dermabond. The vein skin site was closed using a single layer of absorbable  suture and Dermabond. Sterile dressings were applied. Patient tolerated the procedure well without an immediate complication. Ultrasound and fluoroscopic images were taken and saved for this procedure.  Estimated blood loss: Minimal  COMPLICATIONS: None  IMPRESSION: Placement of a subcutaneous port device. Catheter tip at the superior cavoatrial junction and ready to be used.   Electronically Signed   By: Markus Daft M.D.   On: 11/12/2014 16:00   US Thoracentesis Asp Pleural Space W/img Guide  10/29/2014   INDICATION: Patient with history of right breast cancer; now with some dyspnea with exertion and right pleural effusion. Request is made for diagnostic and therapeutic right thoracentesis.  EXAM: ULTRASOUND GUIDED DIAGNOSTIC AND THERAPEUTIC RIGHT THORACENTESIS  COMPARISON:  None.  MEDICATIONS: None  COMPLICATIONS: None immediate  TECHNIQUE:  Informed written consent was obtained from the patient after a discussion of the risks, benefits and alternatives to treatment. A timeout was performed prior to the initiation of the procedure.  Initial ultrasound scanning demonstrates a moderate to large right pleural effusion. The lower chest was prepped and draped in the usual sterile fashion. 1% lidocaine was used for local anesthesia.  An ultrasound image was saved for documentation purposes. A 6 Fr Safe-T-Centesis catheter was introduced. The thoracentesis was performed. The catheter was removed and a dressing was applied. The patient tolerated the procedure well without immediate post procedural complication. The patient was escorted to have an upright chest radiograph.  FINDINGS: A total of approximately 1 liter of hazy, yellow fluid was removed. Requested samples were sent to the laboratory.  IMPRESSION: Successful ultrasound-guided diagnostic and therapeutic right sided thoracentesis yielding 1 liter of pleural fluid.  Read by: Rowe Robert, PA-C   Electronically Signed   By: Lucrezia Europe M.D.   On: 10/29/2014 15:39       IMPRESSION/PLAN: This is a very pleasant 49 year old woman with metastatic disease to the brain.  I had a lengthy discussion with the patient and he husband after reviewing their MRI results with them.  We spoke about whole brain radiotherapy versus stereotactic radiosurgery to the brain. We spoke about the differing risks benefits and side effects of both of these treatments. During part of our discussion, we spoke about the hair loss, fatigue and cognitive effects that can result from whole brain radiotherapy.  Additionally, we spoke about radionecrosis that can result from stereotactic radiosurgery.   Will order 3T MRI after her vacation to assess brain more reliably and verify if SRS is a good option (this is her preference))  If SRS is recommended, she will have a simulation soon after the MRI and meet with neurosurgery; a neurosurgeon will participate in their case.    Contact card given to patient today with my # and contact info for Mont Dutton, RT, [facility] coordinator.     __________________________________________   Eppie Gibson, MD

## 2014-11-16 NOTE — Patient Instructions (Signed)
[facility] Discharge Instructions for Patients Receiving Chemotherapy  Today you received the following chemotherapy agents ABRAXANE  To help prevent nausea and vomiting after your treatment, we encourage you to take your nausea medication if needed.   If you develop nausea and vomiting that is not controlled by your nausea medication, call the clinic.   BELOW ARE SYMPTOMS THAT SHOULD BE REPORTED IMMEDIATELY:  *FEVER GREATER THAN 100.5 F  *CHILLS WITH OR WITHOUT FEVER  NAUSEA AND VOMITING THAT IS NOT CONTROLLED WITH YOUR NAUSEA MEDICATION  *UNUSUAL SHORTNESS OF BREATH  *UNUSUAL BRUISING OR BLEEDING  TENDERNESS IN MOUTH AND THROAT WITH OR WITHOUT PRESENCE OF ULCERS  *URINARY PROBLEMS  *BOWEL PROBLEMS  UNUSUAL RASH Items with * indicate a potential emergency and should be followed up as soon as possible.  Feel free to call the clinic you have any questions or concerns. The clinic phone number is [phone].  Please show the [facility] at check-in to the Emergency Department and triage nurse.    Zoledronic Acid injection (Hypercalcemia, Oncology) What is this medicine? ZOLEDRONIC ACID (ZOE le dron ik AS id) lowers the amount of calcium loss from bone. It is used to treat too much calcium in your blood from cancer. It is also used to prevent complications of cancer that has spread to the bone. This medicine may be used for other purposes; ask your health care provider or pharmacist if you have questions. COMMON BRAND NAME(S): Zometa What should I tell my health care provider before I take this medicine? They need to know if you have any of these conditions: -aspirin-sensitive asthma -cancer, especially if you are receiving medicines used to treat cancer -dental disease or wear dentures -infection -kidney disease -receiving corticosteroids like dexamethasone or prednisone -an unusual or allergic reaction to zoledronic acid, other medicines, foods,  dyes, or preservatives -pregnant or trying to get pregnant -breast-feeding How should I use this medicine? This medicine is for infusion into a vein. It is given by a health care professional in a hospital or clinic setting. Talk to your pediatrician regarding the use of this medicine in children. Special care may be needed. Overdosage: If you think you have taken too much of this medicine contact a poison control center or emergency room at once. NOTE: This medicine is only for you. Do not share this medicine with others. What if I miss a dose? It is important not to miss your dose. Call your doctor or health care professional if you are unable to keep an appointment. What may interact with this medicine? -certain antibiotics given by injection -NSAIDs, medicines for pain and inflammation, like ibuprofen or naproxen -some diuretics like bumetanide, furosemide -teriparatide -thalidomide This list may not describe all possible interactions. Give your health care provider a list of all the medicines, herbs, non-prescription drugs, or dietary supplements you use. Also tell them if you smoke, drink alcohol, or use illegal drugs. Some items may interact with your medicine. What should I watch for while using this medicine? Visit your doctor or health care professional for regular checkups. It may be some time before you see the benefit from this medicine. Do not stop taking your medicine unless your doctor tells you to. Your doctor may order blood tests or other tests to see how you are doing. Women should inform their doctor if they wish to become pregnant or think they might be pregnant. There is a potential for serious side effects to an unborn child. Talk  to your health care professional or pharmacist for more information. You should make sure that you get enough calcium and vitamin D while you are taking this medicine. Discuss the foods you eat and the vitamins you take with your health care  professional. Some people who take this medicine have severe bone, joint, and/or muscle pain. This medicine may also increase your risk for jaw problems or a broken thigh bone. Tell your doctor right away if you have severe pain in your jaw, bones, joints, or muscles. Tell your doctor if you have any pain that does not go away or that gets worse. Tell your dentist and dental surgeon that you are taking this medicine. You should not have major dental surgery while on this medicine. See your dentist to have a dental exam and fix any dental problems before starting this medicine. Take good care of your teeth while on this medicine. Make sure you see your dentist for regular follow-up appointments. What side effects may I notice from receiving this medicine? Side effects that you should report to your doctor or health care professional as soon as possible: -allergic reactions like skin rash, itching or hives, swelling of the face, lips, or tongue -anxiety, confusion, or depression -breathing problems -changes in vision -eye pain -feeling faint or lightheaded, falls -jaw pain, especially after dental work -mouth sores -muscle cramps, stiffness, or weakness -trouble passing urine or change in the amount of urine Side effects that usually do not require medical attention (report to your doctor or health care professional if they continue or are bothersome): -bone, joint, or muscle pain -constipation -diarrhea -fever -hair loss -irritation at site where injected -loss of appetite -nausea, vomiting -stomach upset -trouble sleeping -trouble swallowing -weak or tired This list may not describe all possible side effects. Call your doctor for medical advice about side effects. You may report side effects to FDA at 1-800-FDA-1088. Where should I keep my medicine? This drug is given in a hospital or clinic and will not be stored at home. NOTE: This sheet is a summary. It may not cover all possible  information. If you have questions about this medicine, talk to your doctor, pharmacist, or health care provider.  2015, [employer]vier/Gold Standard. (2012-10-17 13:03:13)  

## 2014-11-17 ENCOUNTER — Other Ambulatory Visit: Payer: Self-pay

## 2014-11-17 ENCOUNTER — Emergency Department (HOSPITAL_COMMUNITY): Payer: BLUE CROSS/BLUE SHIELD

## 2014-11-17 ENCOUNTER — Inpatient Hospital Stay (HOSPITAL_COMMUNITY)
Admission: EM | Admit: 2014-11-17 | Discharge: 2014-11-19 | DRG: 597 | Disposition: A | Discharge status: Home / Self Care | Payer: BLUE CROSS/BLUE SHIELD | Attending: Internal Medicine | Admitting: Internal Medicine

## 2014-11-17 ENCOUNTER — Encounter (HOSPITAL_COMMUNITY): Payer: Self-pay | Admitting: *Deleted

## 2014-11-17 DIAGNOSIS — C50419 Malignant neoplasm of upper-outer quadrant of unspecified female breast: Secondary | ICD-10-CM | POA: Diagnosis present

## 2014-11-17 DIAGNOSIS — Z9011 Acquired absence of right breast and nipple: Secondary | ICD-10-CM | POA: Diagnosis present

## 2014-11-17 DIAGNOSIS — R591 Generalized enlarged lymph nodes: Secondary | ICD-10-CM | POA: Diagnosis present

## 2014-11-17 DIAGNOSIS — Z801 Family history of malignant neoplasm of trachea, bronchus and lung: Secondary | ICD-10-CM

## 2014-11-17 DIAGNOSIS — C50911 Malignant neoplasm of unspecified site of right female breast: Principal | ICD-10-CM | POA: Diagnosis present

## 2014-11-17 DIAGNOSIS — D72829 Elevated white blood cell count, unspecified: Secondary | ICD-10-CM | POA: Diagnosis present

## 2014-11-17 DIAGNOSIS — J910 Malignant pleural effusion: Secondary | ICD-10-CM | POA: Diagnosis present

## 2014-11-17 DIAGNOSIS — C7951 Secondary malignant neoplasm of bone: Secondary | ICD-10-CM | POA: Diagnosis present

## 2014-11-17 DIAGNOSIS — C7971 Secondary malignant neoplasm of right adrenal gland: Secondary | ICD-10-CM | POA: Diagnosis present

## 2014-11-17 DIAGNOSIS — E876 Hypokalemia: Secondary | ICD-10-CM | POA: Diagnosis present

## 2014-11-17 DIAGNOSIS — J9601 Acute respiratory failure with hypoxia: Secondary | ICD-10-CM | POA: Diagnosis present

## 2014-11-17 DIAGNOSIS — C801 Malignant (primary) neoplasm, unspecified: Secondary | ICD-10-CM

## 2014-11-17 DIAGNOSIS — E039 Hypothyroidism, unspecified: Secondary | ICD-10-CM | POA: Diagnosis present

## 2014-11-17 DIAGNOSIS — C787 Secondary malignant neoplasm of liver and intrahepatic bile duct: Secondary | ICD-10-CM | POA: Diagnosis present

## 2014-11-17 DIAGNOSIS — C7800 Secondary malignant neoplasm of unspecified lung: Secondary | ICD-10-CM | POA: Diagnosis present

## 2014-11-17 DIAGNOSIS — C7931 Secondary malignant neoplasm of brain: Secondary | ICD-10-CM | POA: Diagnosis present

## 2014-11-17 DIAGNOSIS — Z800 Family history of malignant neoplasm of digestive organs: Secondary | ICD-10-CM

## 2014-11-17 DIAGNOSIS — R6510 Systemic inflammatory response syndrome (SIRS) of non-infectious origin without acute organ dysfunction: Secondary | ICD-10-CM | POA: Diagnosis present

## 2014-11-17 DIAGNOSIS — J90 Pleural effusion, not elsewhere classified: Secondary | ICD-10-CM

## 2014-11-17 DIAGNOSIS — C50411 Malignant neoplasm of upper-outer quadrant of right female breast: Secondary | ICD-10-CM | POA: Diagnosis present

## 2014-11-17 DIAGNOSIS — Z79899 Other long term (current) drug therapy: Secondary | ICD-10-CM

## 2014-11-17 DIAGNOSIS — C50919 Malignant neoplasm of unspecified site of unspecified female breast: Secondary | ICD-10-CM | POA: Diagnosis present

## 2014-11-17 DIAGNOSIS — R509 Fever, unspecified: Secondary | ICD-10-CM | POA: Diagnosis not present

## 2014-11-17 DIAGNOSIS — Z808 Family history of malignant neoplasm of other organs or systems: Secondary | ICD-10-CM

## 2014-11-17 DIAGNOSIS — R0602 Shortness of breath: Secondary | ICD-10-CM

## 2014-11-17 DIAGNOSIS — Z791 Long term (current) use of non-steroidal anti-inflammatories (NSAID): Secondary | ICD-10-CM

## 2014-11-17 LAB — CBC WITH DIFFERENTIAL/PLATELET
Basophils Absolute: 0 K/uL (ref 0.0–0.1)
Basophils Relative: 0 % (ref 0–1)
Eosinophils Absolute: 0 K/uL (ref 0.0–0.7)
Eosinophils Relative: 0 % (ref 0–5)
HCT: 38.3 % (ref 36.0–46.0)
Hemoglobin: 12.8 g/dL (ref 12.0–15.0)
Lymphocytes Relative: 8 % — ABNORMAL LOW (ref 12–46)
Lymphs Abs: 0.4 K/uL — ABNORMAL LOW (ref 0.7–4.0)
MCH: 34.2 pg — ABNORMAL HIGH (ref 26.0–34.0)
MCHC: 33.4 g/dL (ref 30.0–36.0)
MCV: 102.4 fL — ABNORMAL HIGH (ref 78.0–100.0)
Monocytes Absolute: 0.1 K/uL (ref 0.1–1.0)
Monocytes Relative: 2 % — ABNORMAL LOW (ref 3–12)
Neutro Abs: 4.8 K/uL (ref 1.7–7.7)
Neutrophils Relative %: 90 % — ABNORMAL HIGH (ref 43–77)
Platelets: 208 K/uL (ref 150–400)
RBC: 3.74 MIL/uL — ABNORMAL LOW (ref 3.87–5.11)
RDW: 13 % (ref 11.5–15.5)
WBC: 5.3 K/uL (ref 4.0–10.5)

## 2014-11-17 LAB — COMPREHENSIVE METABOLIC PANEL
ALT: 20 U/L (ref 14–54)
AST: 22 U/L (ref 15–41)
Albumin: 3.6 g/dL (ref 3.5–5.0)
Alkaline Phosphatase: 94 U/L (ref 38–126)
Anion gap: 9 (ref 5–15)
BUN: 15 mg/dL (ref 6–20)
CO2: 26 mmol/L (ref 22–32)
Calcium: 8.2 mg/dL — ABNORMAL LOW (ref 8.9–10.3)
Chloride: 101 mmol/L (ref 101–111)
Creatinine, Ser: 0.58 mg/dL (ref 0.44–1.00)
GFR calc Af Amer: >60 mL/min (ref >60)
GFR calc non Af Amer: >60 mL/min (ref >60)
Glucose, Bld: 109 mg/dL — ABNORMAL HIGH (ref 65–99)
Potassium: 3.5 mmol/L (ref 3.5–5.1)
Sodium: 136 mmol/L (ref 135–145)
Total Bilirubin: 0.6 mg/dL (ref 0.3–1.2)
Total Protein: 6.6 g/dL (ref 6.5–8.1)

## 2014-11-17 LAB — I-STAT CG4 LACTIC ACID, ED: Lactic Acid, Venous: 0.85 mmol/L (ref 0.5–2.0)

## 2014-11-17 MED ORDER — VANCOMYCIN HCL IN DEXTROSE 1-5 GM/200ML-% IV SOLN
1000.0000 mg | Freq: Three times a day (TID) | INTRAVENOUS | Status: DC
  Administered 2014-11-18 – 2014-11-19 (×4): 1000 mg via INTRAVENOUS
  Filled 2014-11-17 (×6): qty 200

## 2014-11-17 MED ORDER — VANCOMYCIN HCL IN DEXTROSE 1-5 GM/200ML-% IV SOLN
1000.0000 mg | Freq: Once | INTRAVENOUS | Status: AC
  Administered 2014-11-17: 1000 mg via INTRAVENOUS
  Filled 2014-11-17: qty 200

## 2014-11-17 MED ORDER — ACETAMINOPHEN 325 MG PO TABS
650.0000 mg | ORAL_TABLET | Freq: Once | ORAL | Status: AC
  Administered 2014-11-17: 650 mg via ORAL
  Filled 2014-11-17: qty 2

## 2014-11-17 MED ORDER — PIPERACILLIN-TAZOBACTAM 3.375 G IVPB
3.3750 g | Freq: Three times a day (TID) | INTRAVENOUS | Status: DC
  Administered 2014-11-18 – 2014-11-19 (×4): 3.375 g via INTRAVENOUS
  Filled 2014-11-17 (×6): qty 50

## 2014-11-17 MED ORDER — PIPERACILLIN-TAZOBACTAM 3.375 G IVPB 30 MIN
3.3750 g | Freq: Once | INTRAVENOUS | Status: AC
  Administered 2014-11-17: 3.375 g via INTRAVENOUS
  Filled 2014-11-17: qty 50

## 2014-11-17 NOTE — Progress Notes (Signed)
ANTIBIOTIC CONSULT NOTE - INITIAL  Pharmacy Consult for Vancomycin, Zosyn Indication: sepsis  Allergies  Allergen Reactions  . Almond Meal Rash  . Other Rash    LOTIONS    Patient Measurements:   Adjusted Body Weight:   Vital Signs: Temp: 101.3 F (38.5 C) (06/28 2113) Temp Source: Oral (06/28 2113) BP: 132/72 mmHg (06/28 2113) Pulse Rate: 122 (06/28 2113) Intake/Output from previous day:   Intake/Output from this shift:    Labs:  Recent Labs  11/16/14 1224 11/16/14 1224  WBC 3.5*  --   HGB 13.1  --   PLT 242  --   CREATININE  --  0.7   Estimated Creatinine Clearance: 94.4 mL/min (by C-G formula based on Cr of 0.7). No results for input(s): VANCOTROUGH, VANCOPEAK, VANCORANDOM, GENTTROUGH, GENTPEAK, GENTRANDOM, TOBRATROUGH, TOBRAPEAK, TOBRARND, AMIKACINPEAK, AMIKACINTROU, AMIKACIN in the last 72 hours.   Microbiology: Recent Results (from the past 720 hour(s))  Body fluid culture     Status: None   Collection Time: 10/29/14  3:09 PM  Result Value Ref Range Status   Specimen Description PLEURAL RIGHT  Final   Special Requests NONE  Final   Gram Stain   Final    NO WBC SEEN NO ORGANISMS SEEN Performed at [facility]    Culture   Final    NO GROWTH 3 DAYS Performed at [facility]    Report Status 11/02/2014 FINAL  Final    Medical History: Past Medical History  Diagnosis Date  . Complication of anesthesia     her father and uncle both had very difficult time waking up-was  something they told them may be hereditory. she will find out.  [name] Hypothyroidism   . 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 Radiation 01/21/13-03/06/13    Right Breast  . Breast cancer dx'd 04-10-12-rt     Assessment: Nicole Valentine with PMH significant for metastatic breast cancer (currently receiving chemotherapy, Abraxane C1D8 given 6/27) presents with fever.  Code sepsis called.  Pharmacy consulted to dose vancomycin and Zosyn.  Blood cultures ordered.  Vancomycin 1g and  Zosyn 3.375g ordered x 1 in ED.  SCr 0.7 yesterday for CrCl>100 ml/min.  WBC 3.5 yesterday.  Labs pending today.  Goal of Therapy:  Vancomycin trough level 15-20 mcg/ml  Doses adjusted per renal function Eradication of infection  Plan:  1.  Vancomycin 1g IV q8h. 2.  Zosyn 3.375g IV q8h (4 hour infusion time). 3.  F/u SCr, trough level, culture results, clinical course.  Nicole Valentine 11/17/2014,9:57 PM

## 2014-11-17 NOTE — ED Notes (Signed)
Pt states that she just had Chemo on Monday and she began to have chills and running a fever around 8pm; pt states it was shortly after removing her Neulasta

## 2014-11-17 NOTE — ED Notes (Signed)
Bed: WA20 Expected date:  Expected time:  Means of arrival:  Comments: Hold for triage 1 

## 2014-11-17 NOTE — ED Provider Notes (Signed)
CSN: [id number]     Arrival date & time 11/17/14  2102 History   First MD Initiated Contact with Patient 11/17/14 2121     Chief Complaint  Patient presents with  . Fever   HPI   49 y.o. BRCA negative [place] woman with triple-negative stage IV breast cancer, with mets presents today with fever of 101.3. Pt reports she received Abraxane on 11/09/2014 and again yesterday. She reports she has been feeling well until today around 7pm. She reports it began shortly after removing her neulasta. Patient has no additional complaints in addition to the fever. Patient denies headache, vision changes, photophobia, neck stiffness, upper respiratory complaints, shortness of breath, chest pain, abdominal pain, changes in color clarity characteristics for urine., No skin rashes, signs of infection. Patient notes that she has a chronic cough, this is unchanged.   Past Medical History  Diagnosis Date  . Complication of anesthesia     her father and uncle both had very difficult time waking up-was  something they told them may be hereditory. she will find out.  [name] Hypothyroidism   . 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 Radiation 01/21/13-03/06/13    Right Breast  . Breast cancer dx'd 04-10-12-rt   Past Surgical History  Procedure Laterality Date  . Wisdom tooth extraction    . Portacath placement  04/22/2012    Procedure: INSERTION PORT-A-CATH;  Surgeon: [name], MD;  Location: [facility];  Service: General;  Laterality: Left;  [name] Modified mastectomy Right 11/18/2012    Procedure:  RIGHT MODIFIED MASTECTOMY;  Surgeon: [name], MD;  Location: [facility];  Service: General;  Laterality: Right;  . Knee arthroscopy Right 11/26/2013  . Port-a-cath removal Left 12/10/2013    Procedure: MINOR REMOVAL PORT-A-CATH;  Surgeon: [name], MD;  Location: [facility];  Service: General;  Laterality: Left;   Family History  Problem Relation Age of Onset   . Lung cancer Maternal Grandfather 73    smoker  . Prostate cancer Maternal Grandfather 90  . Throat cancer Other     Great Aunt x 2  . Liver cancer Other     Maternal Great Grandmother  . Melanoma Maternal Uncle 81  . Brain cancer Cousin 11    non-malignant   History  Substance Use Topics  . Smoking status: Never Smoker   . Smokeless tobacco: Never Used  . Alcohol Use: No   OB History    No data available     Review of Systems  All other systems reviewed and are negative.  Allergies  Almond meal and Other  Home Medications   Prior to Admission medications   Medication Sig Start Date End Date Taking? Authorizing Provider  Ascorbic Acid (VITAMIN C PO) Take 1 tablet by mouth at bedtime.    Yes Historical Provider, MD  cholecalciferol (VITAMIN D) 1000 UNITS tablet Take 2,000 Units by mouth 2 (two) times daily.    Yes Historical Provider, MD  fexofenadine (ALLEGRA) 180 MG tablet Take 180 mg by mouth daily as needed for allergies or rhinitis.   Yes Historical Provider, MD  ibuprofen (ADVIL,MOTRIN) 200 MG tablet Take 400 mg by mouth every 6 (six) hours as needed for headache or moderate pain.   Yes Historical Provider, MD  levothyroxine (SYNTHROID, LEVOTHROID) 175 MCG tablet Take 175 mcg by mouth daily before breakfast.   Yes Historical Provider, MD  lidocaine-prilocaine (EMLA) cream Apply to affected area once  11/06/14  Yes Chauncey Cruel, MD  LORazepam (ATIVAN) 0.5 MG tablet Take 1 tablet (0.5 mg total) by mouth at bedtime as needed (Nausea or vomiting). 11/06/14  Yes Chauncey Cruel, MD  ondansetron (ZOFRAN) 8 MG tablet Take 1 tablet (8 mg total) by mouth 2 (two) times daily. Start the day after chemo for 2 days. Then take as needed for nausea or vomiting. 11/06/14  Yes Chauncey Cruel, MD  pegfilgrastim (NEULASTA) 6 MG/0.6ML injection Inject 6 mg into the skin once. Patch delivery system after chemo treatments   Yes Historical Provider, MD  prochlorperazine (COMPAZINE) 10  MG tablet Take 1 tablet (10 mg total) by mouth every 6 (six) hours as needed (Nausea or vomiting). 11/06/14  Yes Chauncey Cruel, MD  azithromycin (ZITHROMAX) 250 MG tablet Take as directed Patient not taking: Reported on 11/17/2014 11/09/14   Chauncey Cruel, MD   BP 97/50 mmHg  Pulse 117  Temp(Src) 101.2 F (38.4 C) (Oral)  Resp 22  Ht '5\' 7"'  (1.702 m)  Wt 175 lb (79.379 kg)  BMI 27.40 kg/m2  SpO2 93%  LMP 03/29/2012   Physical Exam  Constitutional: She is oriented to person, place, and time. She appears well-developed and well-nourished.  HENT:  Head: Normocephalic and atraumatic.  Eyes: Pupils are equal, round, and reactive to light.  Neck: Normal range of motion. Neck supple. No JVD present. No tracheal deviation present. No thyromegaly present.  Cardiovascular: Normal rate, regular rhythm, normal heart sounds and intact distal pulses.  Exam reveals no gallop and no friction rub.   No murmur heard. Pulmonary/Chest: Effort normal and breath sounds normal. No stridor. No respiratory distress. She has no wheezes. She has no rales. She exhibits no tenderness.  Port chest wall, no signs of surrounding infection  Abdominal: Soft. Bowel sounds are normal. She exhibits no distension and no mass. There is no tenderness. There is no rebound and no guarding.  Musculoskeletal: Normal range of motion.  Lymphadenopathy:    She has no cervical adenopathy.  Neurological: She is alert and oriented to person, place, and time. Coordination normal.  Skin: Skin is warm and dry.  Psychiatric: She has a normal mood and affect. Her behavior is normal. Judgment and thought content normal.  Nursing note and vitals reviewed.   ED Course  Procedures (including critical care time) Labs Review Labs Reviewed  COMPREHENSIVE METABOLIC PANEL - Abnormal; Notable for the following:    Glucose, Bld 109 (*)    Calcium 8.2 (*)    All other components within normal limits  CBC WITH DIFFERENTIAL/PLATELET -  Abnormal; Notable for the following:    RBC 3.74 (*)    MCV 102.4 (*)    MCH 34.2 (*)    Neutrophils Relative % 90 (*)    Lymphocytes Relative 8 (*)    Lymphs Abs 0.4 (*)    Monocytes Relative 2 (*)    All other components within normal limits  CULTURE, BLOOD (ROUTINE X 2)  CULTURE, BLOOD (ROUTINE X 2)  URINE CULTURE  URINALYSIS, ROUTINE W REFLEX MICROSCOPIC (NOT AT [facility])  I-STAT CG4 LACTIC ACID, ED  I-STAT CG4 LACTIC ACID, ED    Imaging Review Mr Kizzie Fantasia Contrast  11/16/2014   CLINICAL DATA:  Breast cancer. Staging. No reported neurologic symptoms.  EXAM: MRI HEAD WITHOUT AND WITH CONTRAST  TECHNIQUE: Multiplanar, multiecho pulse sequences of the brain and surrounding structures were obtained without and with intravenous contrast.  CONTRAST:  MultiHance 17 mL.  COMPARISON:  CT head 10/28/2014.  FINDINGS: No acute stroke, acute hemorrhage, hydrocephalus, or extra-axial fluid. Normal cerebral volume. No significant white matter disease.  No definite osseous metastatic disease. Upper cervical region unremarkable. Trapped fluid in the RIGHT petrous apex, non worrisome.  Post infusion, two metastatic lesions are identified. The larger lesion is in the RIGHT cerebellar tonsil, 4 mm diameter, adjacent to the foramen of Luschka (image 13 series 11). A smaller lesion is seen in the RIGHT lateral cerebellum, 3 mm diameter, (image 19 series 11), adjacent to a superiorly draining cerebellar developmental venous anomaly. No mass effect or significant surrounding edema. No other definite intracranial metastatic lesions are seen. Incidental additional second DVA RIGHT medial frontal cortex, seen on image 41 series 11.  Major dural venous sinuses are patent. Negative orbits. No sinus air-fluid level. 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 No significant edema or mass  effect from either.  Findings discussed with ordering provider.   Electronically Signed   By: Staci Righter M.D.   On: 11/16/2014 09:08   Dg Chest Portable 1 View  11/17/2014   CLINICAL DATA:  Fever. Cough and shortness of breath for 2 weeks. History of metastatic breast cancer.  EXAM: PORTABLE CHEST - 1 VIEW  COMPARISON:  PET-CT 11/06/2014, chest radiographs 10/29/2014  FINDINGS: Tip of the accessed left chest port in the mid SVC. Moderate-large right pleural effusion occupying the lower 1/2 of right hemithorax. No pneumothorax. There is adjacent opacity at the right lung base, likely combination of atelectasis and known metastatic disease. No consolidation in the aerated right upper or left lung. The heart size and mediastinal contours are normal for technique.  IMPRESSION: Moderate to large right pleural effusion occupying the lower 1/2 of right hemithorax. Upper right lung and left lung are clear.   Electronically Signed   By: Jeb Levering M.D.   On: 11/17/2014 22:43     EKG Interpretation None      MDM   Final diagnoses:  Fever, unspecified fever cause  Cancer    Labs: I-STAT lactic acid, blood culture, CMP, CBC- no significant or contributing findings  Imaging: DG chest- monitor to large right pleural effusion  Consults: Hospitalist  Therapeutics: Tylenol, normal saline, vancomycin, Zosyn   Plan: Patient presents with a fever of unknown origin. Currently receiving chemotherapy. Last dose yesterday. No focal findings on exam that would contribute to fever. Patient remained stable throughout her stay in the ED, hospitalist was conmsult who agreed to admit the patient.       Okey Regal, PA-C 11/18/14 0059  Lacretia Leigh, MD 11/19/14 2037

## 2014-11-17 NOTE — ED Notes (Signed)
Patient is aware urine sample is needed. RN Bryson Corona getting fluids started.

## 2014-11-18 ENCOUNTER — Inpatient Hospital Stay (HOSPITAL_COMMUNITY): Payer: BLUE CROSS/BLUE SHIELD

## 2014-11-18 ENCOUNTER — Inpatient Hospital Stay (HOSPITAL_COMMUNITY)
Admit: 2014-11-18 | Discharge: 2014-11-18 | Disposition: A | Discharge status: Home / Self Care | Payer: BLUE CROSS/BLUE SHIELD | Attending: Oncology | Admitting: Oncology

## 2014-11-18 ENCOUNTER — Ambulatory Visit: Payer: BLUE CROSS/BLUE SHIELD

## 2014-11-18 DIAGNOSIS — C7800 Secondary malignant neoplasm of unspecified lung: Secondary | ICD-10-CM | POA: Diagnosis present

## 2014-11-18 DIAGNOSIS — Z800 Family history of malignant neoplasm of digestive organs: Secondary | ICD-10-CM | POA: Diagnosis not present

## 2014-11-18 DIAGNOSIS — C50911 Malignant neoplasm of unspecified site of right female breast: Secondary | ICD-10-CM | POA: Diagnosis present

## 2014-11-18 DIAGNOSIS — R6510 Systemic inflammatory response syndrome (SIRS) of non-infectious origin without acute organ dysfunction: Secondary | ICD-10-CM | POA: Diagnosis present

## 2014-11-18 DIAGNOSIS — C7951 Secondary malignant neoplasm of bone: Secondary | ICD-10-CM | POA: Diagnosis present

## 2014-11-18 DIAGNOSIS — Z808 Family history of malignant neoplasm of other organs or systems: Secondary | ICD-10-CM | POA: Diagnosis not present

## 2014-11-18 DIAGNOSIS — J9601 Acute respiratory failure with hypoxia: Secondary | ICD-10-CM | POA: Diagnosis present

## 2014-11-18 DIAGNOSIS — E039 Hypothyroidism, unspecified: Secondary | ICD-10-CM | POA: Diagnosis present

## 2014-11-18 DIAGNOSIS — J910 Malignant pleural effusion: Secondary | ICD-10-CM | POA: Diagnosis present

## 2014-11-18 DIAGNOSIS — C7931 Secondary malignant neoplasm of brain: Secondary | ICD-10-CM | POA: Diagnosis present

## 2014-11-18 DIAGNOSIS — Z791 Long term (current) use of non-steroidal anti-inflammatories (NSAID): Secondary | ICD-10-CM | POA: Diagnosis not present

## 2014-11-18 DIAGNOSIS — C50419 Malignant neoplasm of upper-outer quadrant of unspecified female breast: Secondary | ICD-10-CM | POA: Diagnosis present

## 2014-11-18 DIAGNOSIS — C787 Secondary malignant neoplasm of liver and intrahepatic bile duct: Secondary | ICD-10-CM | POA: Diagnosis present

## 2014-11-18 DIAGNOSIS — A419 Sepsis, unspecified organism: Secondary | ICD-10-CM | POA: Diagnosis not present

## 2014-11-18 DIAGNOSIS — C50919 Malignant neoplasm of unspecified site of unspecified female breast: Secondary | ICD-10-CM | POA: Diagnosis not present

## 2014-11-18 DIAGNOSIS — E876 Hypokalemia: Secondary | ICD-10-CM | POA: Diagnosis present

## 2014-11-18 DIAGNOSIS — Z9011 Acquired absence of right breast and nipple: Secondary | ICD-10-CM | POA: Diagnosis present

## 2014-11-18 DIAGNOSIS — C7971 Secondary malignant neoplasm of right adrenal gland: Secondary | ICD-10-CM | POA: Diagnosis present

## 2014-11-18 DIAGNOSIS — Z79899 Other long term (current) drug therapy: Secondary | ICD-10-CM | POA: Diagnosis not present

## 2014-11-18 DIAGNOSIS — Z801 Family history of malignant neoplasm of trachea, bronchus and lung: Secondary | ICD-10-CM | POA: Diagnosis not present

## 2014-11-18 DIAGNOSIS — D72829 Elevated white blood cell count, unspecified: Secondary | ICD-10-CM | POA: Diagnosis present

## 2014-11-18 DIAGNOSIS — C50411 Malignant neoplasm of upper-outer quadrant of right female breast: Secondary | ICD-10-CM

## 2014-11-18 DIAGNOSIS — R591 Generalized enlarged lymph nodes: Secondary | ICD-10-CM | POA: Diagnosis present

## 2014-11-18 DIAGNOSIS — R509 Fever, unspecified: Secondary | ICD-10-CM | POA: Diagnosis present

## 2014-11-18 LAB — URINALYSIS, ROUTINE W REFLEX MICROSCOPIC
Bilirubin Urine: NEGATIVE
Glucose, UA: NEGATIVE mg/dL
Hgb urine dipstick: NEGATIVE
Ketones, ur: NEGATIVE mg/dL
Nitrite: NEGATIVE
Protein, ur: NEGATIVE mg/dL
Specific Gravity, Urine: 1.015 (ref 1.005–1.030)
Urobilinogen, UA: 0.2 mg/dL (ref 0.0–1.0)
pH: 6 (ref 5.0–8.0)

## 2014-11-18 LAB — CBC
HCT: 36.4 % (ref 36.0–46.0)
Hemoglobin: 12 g/dL (ref 12.0–15.0)
MCH: 34 pg (ref 26.0–34.0)
MCHC: 33 g/dL (ref 30.0–36.0)
MCV: 103.1 fL — AB (ref 78.0–100.0)
PLATELETS: 190 10*3/uL (ref 150–400)
RBC: 3.53 MIL/uL — ABNORMAL LOW (ref 3.87–5.11)
RDW: 13.5 % (ref 11.5–15.5)
WBC: 6.6 10*3/uL (ref 4.0–10.5)

## 2014-11-18 LAB — BASIC METABOLIC PANEL
Anion gap: 8 (ref 5–15)
BUN: 13 mg/dL (ref 6–20)
CALCIUM: 7.7 mg/dL — AB (ref 8.9–10.3)
CO2: 28 mmol/L (ref 22–32)
Chloride: 103 mmol/L (ref 101–111)
Creatinine, Ser: 0.58 mg/dL (ref 0.44–1.00)
GFR calc Af Amer: >60 mL/min (ref >60)
Glucose, Bld: 105 mg/dL — ABNORMAL HIGH (ref 65–99)
POTASSIUM: 3.6 mmol/L (ref 3.5–5.1)
SODIUM: 139 mmol/L (ref 135–145)

## 2014-11-18 LAB — GLUCOSE, SEROUS FLUID: Glucose, Fluid: 93 mg/dL

## 2014-11-18 LAB — URINE MICROSCOPIC-ADD ON

## 2014-11-18 LAB — PH, BODY FLUID: pH, Fluid: 8

## 2014-11-18 LAB — PROTEIN, BODY FLUID: Total protein, fluid: 4.5 g/dL

## 2014-11-18 LAB — I-STAT CG4 LACTIC ACID, ED: Lactic Acid, Venous: 1.06 mmol/L (ref 0.5–2.0)

## 2014-11-18 MED ORDER — ONDANSETRON HCL 8 MG PO TABS
8.0000 mg | ORAL_TABLET | Freq: Two times a day (BID) | ORAL | Status: DC
  Administered 2014-11-18: 8 mg via ORAL
  Filled 2014-11-18: qty 1
  Filled 2014-11-18: qty 2
  Filled 2014-11-18 (×3): qty 1

## 2014-11-18 MED ORDER — SODIUM CHLORIDE 0.9 % IJ SOLN
10.0000 mL | Freq: Two times a day (BID) | INTRAMUSCULAR | Status: DC
  Administered 2014-11-18 (×2): 10 mL

## 2014-11-18 MED ORDER — SODIUM CHLORIDE 0.9 % IV SOLN
INTRAVENOUS | Status: DC
  Administered 2014-11-18 – 2014-11-19 (×2): via INTRAVENOUS

## 2014-11-18 MED ORDER — LIDOCAINE HCL 1 % IJ SOLN
INTRAMUSCULAR | Status: AC
  Filled 2014-11-18: qty 20

## 2014-11-18 MED ORDER — HEPARIN SODIUM (PORCINE) 5000 UNIT/ML IJ SOLN
5000.0000 [IU] | Freq: Three times a day (TID) | INTRAMUSCULAR | Status: DC
  Administered 2014-11-18 – 2014-11-19 (×4): 5000 [IU] via SUBCUTANEOUS
  Filled 2014-11-18 (×8): qty 1

## 2014-11-18 MED ORDER — IBUPROFEN 200 MG PO TABS: 400.0000 mg | ORAL_TABLET | Freq: Four times a day (QID) | ORAL | Status: DC | PRN

## 2014-11-18 MED ORDER — CETYLPYRIDINIUM CHLORIDE 0.05 % MT LIQD
7.0000 mL | Freq: Two times a day (BID) | OROMUCOSAL | Status: DC
  Administered 2014-11-18: 7 mL via OROMUCOSAL

## 2014-11-18 MED ORDER — SODIUM CHLORIDE 0.9 % IJ SOLN
10.0000 mL | INTRAMUSCULAR | Status: DC | PRN
  Administered 2014-11-19: 10 mL
  Filled 2014-11-18: qty 40

## 2014-11-18 MED ORDER — LEVOTHYROXINE SODIUM 175 MCG PO TABS
175.0000 ug | ORAL_TABLET | Freq: Every day | ORAL | Status: DC
  Administered 2014-11-18 – 2014-11-19 (×2): 175 ug via ORAL
  Filled 2014-11-18 (×3): qty 1

## 2014-11-18 MED ORDER — ACETAMINOPHEN 325 MG PO TABS: 650.0000 mg | ORAL_TABLET | Freq: Four times a day (QID) | ORAL | Status: DC | PRN

## 2014-11-18 MED ORDER — PROCHLORPERAZINE MALEATE 10 MG PO TABS: 10.0000 mg | ORAL_TABLET | Freq: Four times a day (QID) | ORAL | Status: DC | PRN

## 2014-11-18 MED ORDER — LORAZEPAM 0.5 MG PO TABS: 0.5000 mg | ORAL_TABLET | Freq: Every evening | ORAL | Status: DC | PRN

## 2014-11-18 NOTE — H&P (Signed)
[facility] History and Physical  Gearlene Godsil SEG:[id number] DOB: Mar 23, 1966 DOA: 11/17/2014  Referring physician: EDP PCP: [name], MD   Chief Complaint: Fever   HPI: Nicole Valentine is a 49 y.o. female with h/o BRCA, malignant pleural effusion, mets to brain.  Had chemo on Monday of this week.  Developed onset of fever and chills around 8pm this evening.  Apparently called physician and was told to come in to ED.  Does have some SOB but no more than she has been.  Has large R sided pleural effusion that is recurrent, malignant, and last drained 2 weeks ago.  Fever up to 101.3 at home.  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  Review of Systems: Systems reviewed.  As above, otherwise negative  Past Medical History  Diagnosis Date  . Complication of anesthesia     her father and uncle both had very difficult time waking up-was  something they told them may be hereditory. she will find out.  [name] Hypothyroidism   . 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 Radiation 01/21/13-03/06/13    Right Breast  . Breast cancer dx'd 04-10-12-rt   Past Surgical History  Procedure Laterality Date  . Wisdom tooth extraction    . Portacath placement  04/22/2012    Procedure: INSERTION PORT-A-CATH;  Surgeon: [name], MD;  Location: [place];  Service: General;  Laterality: Left;  [name] Modified mastectomy Right 11/18/2012    Procedure:  RIGHT MODIFIED MASTECTOMY;  Surgeon: [name], MD;  Location: [place];  Service: General;  Laterality: Right;  . Knee arthroscopy Right 11/26/2013  . Port-a-cath removal Left 12/10/2013    Procedure: MINOR REMOVAL PORT-A-CATH;  Surgeon: [name], MD;  Location: [place];  Service: General;  Laterality: Left;   Social History:  reports that she has never smoked. She has never used smokeless tobacco. She reports that she does not drink alcohol or use illicit  drugs.  Allergies  Allergen Reactions  . Almond Meal Rash  . Other Rash    LOTIONS    Family History  Problem Relation Age of Onset  . Lung cancer Maternal Grandfather 63    smoker  . Prostate cancer Maternal Grandfather 90  . Throat cancer Other     Great Aunt x 2  . Liver cancer Other     Maternal Great Grandmother  . Melanoma Maternal Uncle 81  . Brain cancer Cousin 11    non-malignant     Prior to Admission medications   Medication Sig Start Date End Date Taking? Authorizing Provider  Ascorbic Acid (VITAMIN C PO) Take 1 tablet by mouth at bedtime.    Yes Historical Provider, MD  cholecalciferol (VITAMIN D) 1000 UNITS tablet Take 2,000 Units by mouth 2 (two) times daily.    Yes Historical Provider, MD  fexofenadine (ALLEGRA) 180 MG tablet Take 180 mg by mouth daily as needed for allergies or rhinitis.   Yes Historical Provider, MD  ibuprofen (ADVIL,MOTRIN) 200 MG tablet Take 400 mg by mouth every 6 (six) hours as needed for headache or moderate pain.   Yes Historical Provider, MD  levothyroxine (SYNTHROID, LEVOTHROID) 175 MCG tablet Take 175 mcg by mouth daily before breakfast.   Yes Historical Provider, MD  lidocaine-prilocaine (EMLA) cream Apply to affected area once 11/06/14  Yes Chauncey Cruel, MD  LORazepam (ATIVAN) 0.5 MG tablet Take 1 tablet (0.5 mg total) by mouth at bedtime  as needed (Nausea or vomiting). 11/06/14  Yes Chauncey Cruel, MD  ondansetron (ZOFRAN) 8 MG tablet Take 1 tablet (8 mg total) by mouth 2 (two) times daily. Start the day after chemo for 2 days. Then take as needed for nausea or vomiting. 11/06/14  Yes Chauncey Cruel, MD  pegfilgrastim (NEULASTA) 6 MG/0.6ML injection Inject 6 mg into the skin once. Patch delivery system after chemo treatments   Yes Historical Provider, MD  prochlorperazine (COMPAZINE) 10 MG tablet Take 1 tablet (10 mg total) by mouth every 6 (six) hours as needed (Nausea or vomiting). 11/06/14  Yes Chauncey Cruel, MD    Physical Exam: Filed Vitals:   11/17/14 2356  BP: 97/50  Pulse: 117  Temp: 101.2 F (38.4 C)  Resp: 22    BP 97/50 mmHg  Pulse 117  Temp(Src) 101.2 F (38.4 C) (Oral)  Resp 22  Ht 5' 7" (1.702 m)  Wt 79.379 kg (175 lb)  BMI 27.40 kg/m2  SpO2 93%  LMP 03/29/2012  General Appearance:    Alert, oriented, no distress, appears stated age  Head:    Normocephalic, atraumatic  Eyes:    PERRL, EOMI, sclera non-icteric        Nose:   Nares without drainage or epistaxis. Mucosa, turbinates normal  Throat:   Moist mucous membranes. Oropharynx without erythema or exudate.  Neck:   Supple. No carotid bruits.  No thyromegaly.  No lymphadenopathy.   Back:     No CVA tenderness, no spinal tenderness  Lungs:     Diminished on right lower fields  Chest wall:    No tenderness to palpitation  Heart:    Regular rate and rhythm without murmurs, gallops, rubs  Abdomen:     Soft, non-tender, nondistended, normal bowel sounds, no organomegaly  Genitalia:    deferred  Rectal:    deferred  Extremities:   No clubbing, cyanosis or edema.  Pulses:   2+ and symmetric all extremities  Skin:   Skin color, texture, turgor normal, no rashes or lesions  Lymph nodes:   Cervical, supraclavicular, and axillary nodes normal  Neurologic:   CNII-XII intact. Normal strength, sensation and reflexes      throughout    Labs on Admission:  Basic Metabolic Panel:  Recent Labs Lab 11/12/14 1310 11/16/14 1224 11/17/14 2205  NA 139 141 136  K 3.5 4.0 3.5  CL 100*  --  101  CO2 28 31* 26  GLUCOSE 105* 95 109*  BUN 11 8.7 15  CREATININE 0.59 0.7 0.58  CALCIUM 8.9 8.9 8.2*   Liver Function Tests:  Recent Labs Lab 11/16/14 1224 11/17/14 2205  AST 18 22  ALT 17 20  ALKPHOS 102 94  BILITOT 0.48 0.6  PROT 6.3* 6.6  ALBUMIN 3.2* 3.6   No results for input(s): LIPASE, AMYLASE in the last 168 hours. No results for input(s): AMMONIA in the last 168 hours. CBC:  Recent Labs Lab 11/12/14 1310  11/13/14 0929 11/16/14 1224 11/17/14 2205  WBC 4.3 3.7* 3.5* 5.3  NEUTROABS  --  2.8 2.5 4.8  HGB 13.1 13.7 13.1 12.8  HCT 38.7 39.9 38.9 38.3  MCV 100.0 101.3* 101.4* 102.4*  PLT 185 179 242 208   Cardiac Enzymes: No results for input(s): CKTOTAL, CKMB, CKMBINDEX, TROPONINI in the last 168 hours.  BNP (last 3 results) No results for input(s): PROBNP in the last 8760 hours. CBG: No results for input(s): GLUCAP in the last 168 hours.  Radiological Exams  on Admission: Mr Kizzie Fantasia Contrast  11/16/2014   CLINICAL DATA:  Breast cancer. Staging. No reported neurologic symptoms.  EXAM: MRI HEAD WITHOUT AND WITH CONTRAST  TECHNIQUE: Multiplanar, multiecho pulse sequences of the brain and surrounding structures were obtained without and with intravenous contrast.  CONTRAST:  MultiHance 17 mL.  COMPARISON:  CT head 10/28/2014.  FINDINGS: No acute stroke, acute hemorrhage, hydrocephalus, or extra-axial fluid. Normal cerebral volume. No significant white matter disease.  No definite osseous metastatic disease. Upper cervical region unremarkable. Trapped fluid in the RIGHT petrous apex, non worrisome.  Post infusion, two metastatic lesions are identified. The larger lesion is in the RIGHT cerebellar tonsil, 4 mm diameter, adjacent to the foramen of Luschka (image 13 series 11). A smaller lesion is seen in the RIGHT lateral cerebellum, 3 mm diameter, (image 19 series 11), adjacent to a superiorly draining cerebellar developmental venous anomaly. No mass effect or significant surrounding edema. No other definite intracranial metastatic lesions are seen. Incidental additional second DVA RIGHT medial frontal cortex, seen on image 41 series 11.  Major dural venous sinuses are patent. Negative orbits. No sinus air-fluid level. 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 No  significant edema or mass effect from either.  Findings discussed with ordering provider.   Electronically Signed   By: Staci Righter M.D.   On: 11/16/2014 09:08   Dg Chest Portable 1 View  11/17/2014   CLINICAL DATA:  Fever. Cough and shortness of breath for 2 weeks. History of metastatic breast cancer.  EXAM: PORTABLE CHEST - 1 VIEW  COMPARISON:  PET-CT 11/06/2014, chest radiographs 10/29/2014  FINDINGS: Tip of the accessed left chest port in the mid SVC. Moderate-large right pleural effusion occupying the lower 1/2 of right hemithorax. No pneumothorax. There is adjacent opacity at the right lung base, likely combination of atelectasis and known metastatic disease. No consolidation in the aerated right upper or left lung. The heart size and mediastinal contours are normal for technique.  IMPRESSION: Moderate to large right pleural effusion occupying the lower 1/2 of right hemithorax. Upper right lung and left lung are clear.   Electronically Signed   By: Jeb Levering M.D.   On: 11/17/2014 22:43    EKG: Independently reviewed.  Assessment/Plan Active Problems:   Breast cancer of upper-outer quadrant of right female breast   Breast cancer metastasized to multiple sites   Acute respiratory failure with hypoxia   Malignant pleural effusion   SIRS (systemic inflammatory response syndrome)   1. SIRS in chemo patient - worrisome given that patient just had chemo on Monday.  High risk that her WBC may drop in coming days. 1. Empiric zosyn and vanc ordered until oncology can evaluate.  Patient not neutropenic at this point but very well may become neutropenic during hospital course. 2. Cultures pending 3. No obvious source 4. Tylenol for fever 2. Malignant pleural effusion - causing acute respiratory failure with hypoxia 1. Will go ahead and have IR drain this and send sample for culture in case this is source of #1 above 3. Acute respiratory failure with hypoxia - putting patient on O2 and  draining effusion as above 4. Metastatic BRCA - [facility] in to [facility] for oncology to see patient.   Code Status: Full Code  Family Communication: Husband at bedside Disposition Plan: Admit to inpatient   Time spent: 70 min  GARDNER,  JARED M. [facility] Pager [phone]  If 7AM-7PM, please contact the day team taking care of the patient [email or web address] Password [email or web address] 11/18/2014, 1:08 AM

## 2014-11-18 NOTE — Care Management Note (Signed)
Case Management Note  Patient Details  Name: Patsy Zaragoza MRN: [id number] Date of Birth: 24-Aug-1965  Subjective/Objective:   49 y/o f admitted w/Respiratory failure.SIRS. From home.                 Action/Plan:d/c plan home. No anticipated d/c needs.   Expected Discharge Date:                  Expected Discharge Plan:  Home/Self Care  In-House Referral:     Discharge planning Services  CM Consult  Post Acute Care Choice:    Choice offered to:     DME Arranged:    DME Agency:     HH Arranged:    HH Agency:     Status of Service:  In process, will continue to follow  Medicare Important Message Given:    Date Medicare IM Given:    Medicare IM give by:    Date Additional Medicare IM Given:    Additional Medicare Important Message give by:     If discussed at [name] of Stay Meetings, dates discussed:    Additional Comments:  Dessa Phi, RN 11/18/2014, 2:56 PM

## 2014-11-18 NOTE — Procedures (Signed)
Interventional Radiology Procedure Note  Procedure: US guided right thoracentesis.  1100 cc of thin yellow fluid.  Sample sent with pre-ordered labs..  Complications: None Recommendations:   - Routine care   Signed,  Dulcy Fanny. Earleen Newport, DO

## 2014-11-18 NOTE — Progress Notes (Signed)
[facility] PROGRESS NOTE  Nicole Valentine JIR:[id number] DOB: 11-19-1965 DOA: 11/17/2014 PCP: [name], MD  Brief Summary  The patient is a 49 year old female with history of right breast cancer metastatic to the brain, liver, bones, left long, right adrenal gland with malignant pleural effusion, hypermetabolic diffuse lymphadenopathy who presented with fever.  She started abraxane chemotherapy on 11/09/2014 with zolendronate on 11/16/2014.  She was also undergoing evaluation to start brain radiation. She developed fever and chills and was advised by her primary physician to come to the emergency department where she was found to have a temperature of 101.3 Fahrenheit.  Chest x-ray demonstrated moderate to large right pleural effusion occupying the lower half of the right hemithorax, lungs otherwise clear. Urinalysis had only 3-6 white blood cells and small leukocytes, was otherwise negative. Blood cultures were obtained and she was started on vancomycin and Zosyn.  Assessment/Plan  SIRS and acute hypoxic respiratory failure in a cancer patient currently on chemotherapy, temperature trending down with abx.  Recently had port placed but site appears c/d/i.   -  White blood cell count stable -  Continue vancomycin and Zosyn -  F/u repeat CXR -  Consider CT angio chest to exclude PE given fever, tachycardia, hypoxia if repeat CXR does not demonstrate  -  Blood cultures no growth to date -  Lactic acid within normal limits -  Blood pressure improving with IV fluids and antibiotics  Acute hypoxic respiratory failure likely secondary to malignant pleural effusion -  Thoracentesis by interventional radiology:  Removed 1.1 L of thin yellow fluid -  Follow-up cell count, protein and glucose -  Wean oxygen as tolerated  Metastatic breast cancer -  We will notify Drs. [name] and Isidore Moos of admission  Diet:  regular Access:  port IVF:  yes Proph:  heparin  Code Status:  full Family Communication: patient, her husband, and her daughter Disposition Plan: pending results of culture data, including from her thoracentesis today.  Would like to get her home as soon as safely possible   Consultants:  IR for thoracentesis  Oncology, Dr. Carmelia Bake, Dr. Isidore Moos  Procedures:  CXR  Antibiotics:  vanc 6/28 >  Zosyn 6/28 >  HPI/Subjective:  Has some soreness in the right chest since her thoracentesis and some dry cough.  Feels a rattle in her chest.  Denies pain, swelling, or redness around her port.  Has some chronic clear rhinorrhea.  Denies dysuria, vomiting, diarrhea.    Objective: Filed Vitals:   11/18/14 0232 11/18/14 0537 11/18/14 0906 11/18/14 1043  BP: 85/53 97/59  112/60  Pulse: 97 88  82  Temp: 98.1 F (36.7 C) 98.2 F (36.8 C) 99.4 F (37.4 C) 99.5 F (37.5 C)  TempSrc: Oral Oral  Oral  Resp: 22 22  20   Height: 5\' 7"  (1.702 m)     Weight: 80.1 kg (176 lb 9.4 oz)     SpO2: 94% 100%  96%    Intake/Output Summary (Last 24 hours) at 11/18/14 1258 Last data filed at 11/18/14 1244  Gross per 24 hour  Intake 411.25 ml  Output   1300 ml  Net -888.75 ml   Filed Weights   11/17/14 2241 11/18/14 0232  Weight: 79.379 kg (175 lb) 80.1 kg (176 lb 9.4 oz)    Exam:   General:  Adult female, flat affect, No acute distress  HEENT:  NCAT, MMM  Cardiovascular:  RRR, nl S1, S2 no mrg, 2+ pulses, warm extremities  Respiratory:  Diminished with some course rales at the right base, otherwise CTAB, no increased WOB  Abdomen:   NABS, soft, NT/ND  MSK:   Normal tone and bulk, no LEE  Neuro:  Grossly intact  Skin:  Bandage over right flank/back is c/d/i  Data Reviewed: Basic Metabolic Panel:  Recent Labs Lab 11/12/14 1310 11/16/14 1224 11/17/14 2205 11/18/14 0550  NA 139 141 136 139  K 3.5 4.0 3.5 3.6  CL 100*  --  101 103  CO2 28 31* 26 28  GLUCOSE 105* 95 109* 105*  BUN 11 8.7 15 13   CREATININE 0.59 0.7 0.58 0.58   CALCIUM 8.9 8.9 8.2* 7.7*   Liver Function Tests:  Recent Labs Lab 11/16/14 1224 11/17/14 2205  AST 18 22  ALT 17 20  ALKPHOS 102 94  BILITOT 0.48 0.6  PROT 6.3* 6.6  ALBUMIN 3.2* 3.6   No results for input(s): LIPASE, AMYLASE in the last 168 hours. No results for input(s): AMMONIA in the last 168 hours. CBC:  Recent Labs Lab 11/12/14 1310 11/13/14 0929 11/16/14 1224 11/17/14 2205 11/18/14 0550  WBC 4.3 3.7* 3.5* 5.3 6.6  NEUTROABS  --  2.8 2.5 4.8  --   HGB 13.1 13.7 13.1 12.8 12.0  HCT 38.7 39.9 38.9 38.3 36.4  MCV 100.0 101.3* 101.4* 102.4* 103.1*  PLT 185 179 242 208 190   Cardiac Enzymes: No results for input(s): CKTOTAL, CKMB, CKMBINDEX, TROPONINI in the last 168 hours. BNP (last 3 results) No results for input(s): BNP in the last 8760 hours.  ProBNP (last 3 results) No results for input(s): PROBNP in the last 8760 hours.  CBG: No results for input(s): GLUCAP in the last 168 hours.  No results found for this or any previous visit (from the past 240 hour(s)).   Studies: Dg Chest Portable 1 View  11/17/2014   CLINICAL DATA:  Fever. Cough and shortness of breath for 2 weeks. History of metastatic breast cancer.  EXAM: PORTABLE CHEST - 1 VIEW  COMPARISON:  PET-CT 11/06/2014, chest radiographs 10/29/2014  FINDINGS: Tip of the accessed left chest port in the mid SVC. Moderate-large right pleural effusion occupying the lower 1/2 of right hemithorax. No pneumothorax. There is adjacent opacity at the right lung base, likely combination of atelectasis and known metastatic disease. No consolidation in the aerated right upper or left lung. The heart size and mediastinal contours are normal for technique.  IMPRESSION: Moderate to large right pleural effusion occupying the lower 1/2 of right hemithorax. Upper right lung and left lung are clear.   Electronically Signed   By: Jeb Levering M.D.   On: 11/17/2014 22:43    Scheduled Meds: . antiseptic oral rinse  7 mL  Mouth Rinse BID  . heparin  5,000 Units Subcutaneous 3 times per day  . levothyroxine  175 mcg Oral QAC breakfast  . lidocaine      . ondansetron  8 mg Oral BID  . piperacillin-tazobactam (ZOSYN)  IV  3.375 g Intravenous Q8H  . sodium chloride  10-40 mL Intracatheter Q12H  . vancomycin  1,000 mg Intravenous Q8H   Continuous Infusions: . sodium chloride 75 mL/hr at 11/18/14 0131    Active Problems:   Breast cancer of upper-outer quadrant of right female breast   Breast cancer metastasized to multiple sites   Acute respiratory failure with hypoxia   Malignant pleural effusion   SIRS (systemic inflammatory response syndrome)    Time spent: 30 min    Liboria Putnam  [facility] Pager [phone]. If 7PM-7AM, please contact night-coverage at [email or web address], password [email or web address] 11/18/2014, 12:58 PM  LOS: 0 days

## 2014-11-19 DIAGNOSIS — J9601 Acute respiratory failure with hypoxia: Secondary | ICD-10-CM

## 2014-11-19 DIAGNOSIS — C50911 Malignant neoplasm of unspecified site of right female breast: Principal | ICD-10-CM

## 2014-11-19 DIAGNOSIS — J910 Malignant pleural effusion: Secondary | ICD-10-CM

## 2014-11-19 DIAGNOSIS — C799 Secondary malignant neoplasm of unspecified site: Secondary | ICD-10-CM

## 2014-11-19 DIAGNOSIS — A419 Sepsis, unspecified organism: Secondary | ICD-10-CM

## 2014-11-19 LAB — BODY FLUID CELL COUNT WITH DIFFERENTIAL
Lymphs, Fluid: 14 %
Monocyte-Macrophage-Serous Fluid: 59 % (ref 50–90)
Neutrophil Count, Fluid: 27 % — ABNORMAL HIGH (ref 0–25)
Total Nucleated Cell Count, Fluid: 1472 cu mm — ABNORMAL HIGH (ref 0–1000)

## 2014-11-19 LAB — CBC
HCT: 35.1 % — ABNORMAL LOW (ref 36.0–46.0)
HEMOGLOBIN: 11.6 g/dL — AB (ref 12.0–15.0)
MCH: 34.3 pg — AB (ref 26.0–34.0)
MCHC: 33 g/dL (ref 30.0–36.0)
MCV: 103.8 fL — ABNORMAL HIGH (ref 78.0–100.0)
PLATELETS: 155 10*3/uL (ref 150–400)
RBC: 3.38 MIL/uL — AB (ref 3.87–5.11)
RDW: 13.6 % (ref 11.5–15.5)
WBC: 6 10*3/uL (ref 4.0–10.5)

## 2014-11-19 LAB — BASIC METABOLIC PANEL
Anion gap: 7 (ref 5–15)
BUN: 7 mg/dL (ref 6–20)
CO2: 27 mmol/L (ref 22–32)
CREATININE: 0.59 mg/dL (ref 0.44–1.00)
Calcium: 7.4 mg/dL — ABNORMAL LOW (ref 8.9–10.3)
Chloride: 104 mmol/L (ref 101–111)
GFR calc non Af Amer: >60 mL/min (ref >60)
Glucose, Bld: 104 mg/dL — ABNORMAL HIGH (ref 65–99)
Potassium: 3.2 mmol/L — ABNORMAL LOW (ref 3.5–5.1)
SODIUM: 138 mmol/L (ref 135–145)

## 2014-11-19 LAB — URINE CULTURE: Culture: NO GROWTH

## 2014-11-19 LAB — GRAM STAIN

## 2014-11-19 MED ORDER — POTASSIUM CHLORIDE CRYS ER 20 MEQ PO TBCR
40.0000 meq | EXTENDED_RELEASE_TABLET | Freq: Once | ORAL | Status: AC
  Administered 2014-11-19: 40 meq via ORAL
  Filled 2014-11-19: qty 2

## 2014-11-19 MED ORDER — LEVOFLOXACIN 750 MG PO TABS: 750.0000 mg | ORAL_TABLET | Freq: Every day | ORAL | Status: DC

## 2014-11-19 MED ORDER — HEPARIN SOD (PORK) LOCK FLUSH 100 UNIT/ML IV SOLN
500.0000 [IU] | INTRAVENOUS | Status: DC | PRN
  Administered 2014-11-19: 500 [IU]

## 2014-11-19 MED ORDER — SACCHAROMYCES BOULARDII 250 MG PO CAPS: 250.0000 mg | ORAL_CAPSULE | Freq: Two times a day (BID) | ORAL | Status: DC

## 2014-11-19 MED ORDER — HEPARIN SOD (PORK) LOCK FLUSH 100 UNIT/ML IV SOLN: 500.0000 [IU] | INTRAVENOUS | Status: DC

## 2014-11-19 NOTE — Progress Notes (Signed)
Discharge instructions given to pt, verbalized understanding. Left the unit in stable condition. 

## 2014-11-19 NOTE — Discharge Summary (Addendum)
Physician Discharge Summary  Nicole Valentine ZTI:[id number] DOB: 22-Feb-1966 DOA: 11/17/2014  PCP: [name], MD  Admit date: 11/17/2014 Discharge date: 11/19/2014  Recommendations for Outpatient Follow-up:  1. F/u with Dr. [name] in 1-2 weeks for routine follow up.  Please repeat CBC and BMP to f/u anemia and hypokalemia. 2. Outpatient mapping MRI in preparation for brain radiation 3. If she requires repeat thoracentesis in the next few weeks, consider pleur-x catheter  Discharge Diagnoses:  Principal Problem:   SIRS (systemic inflammatory response syndrome) Active Problems:   Breast cancer of upper-outer quadrant of right female breast   Breast cancer metastasized to multiple sites   Acute respiratory failure with hypoxia   Malignant pleural effusion   Discharge Condition: stable, improved  Diet recommendation: regular  Wt Readings from Last 3 Encounters:  11/18/14 80.1 kg (176 lb 9.4 oz)  11/13/14 81.375 kg (179 lb 6.4 oz)  11/06/14 82.01 kg (180 lb 12.8 oz)    History of present illness:   The patient is a 49 year old female with history of right breast cancer metastatic to the brain, liver, bones, left long, right adrenal gland with malignant pleural effusion, hypermetabolic diffuse lymphadenopathy who presented with fever. She started abraxane chemotherapy on 11/09/2014 with zolendronate on 11/16/2014. She was also undergoing evaluation to start brain radiation. She developed fever and chills and was advised by her primary physician to come to the emergency department where she was found to have a temperature of 101.3 Fahrenheit. Chest x-ray demonstrated moderate to large right pleural effusion occupying the lower half of the right hemithorax, lungs otherwise clear. Urinalysis had only 3-6 white blood cells and small leukocytes, was otherwise negative. Blood cultures were obtained and she was started on vancomycin and Zosyn.  Hospital Course:   SIRS and acute  hypoxic respiratory failure in a cancer patient currently on chemotherapy, temperature trended down rapidly with abx. Recently had port placed but site appears c/d/i. Her white blood cell count remained stable.  She underwent thoracentesis and so far her gram stain and culture are negative.  F/u CXR demonstrated no evidence of underlying infiltrate.  Urine culture was negative.  Would normally stop antibiotics given no bacterial source, however, her fast resolution of her high fever on antibiotics is concerning for bacterial infection.  Also, she is leaving for a beach vacation.  She was given a prescription for levofloxacin and florastor and I have obtained accurate cell phone numbers for her and her husband to call if any of her cultures become positive.    Acute hypoxic respiratory failure likely secondary to malignant pleural effusion, resolved after thoracentesis by interventional radiology with removal 1.1 L of thin yellow fluid.  PH was 8, protein and glucose stable.  She did have an elevated WBC count probably due to her malignant effusion.  She was able to maintain oxygen saturations on room air with rest and exertion > 89% at the time of discharge.  Metastatic breast cancer.  F/u with Dr. [name] post vacation.  Scheduled for thin slice MRI for mapping for XRT per Dr. Isidore Moos.    Consultants:  IR for thoracentesis  Oncology, Dr. Carmelia Bake, Dr. Isidore Moos  Procedures:  CXR  Thoracentesis 6/29   Antibiotics:  vanc 6/28 >  Zosyn 6/28 >  Discharge Exam: Filed Vitals:   11/19/14 0520  BP: 102/54  Pulse: 80  Temp: 98.4 F (36.9 C)  Resp: 20   Filed Vitals:   11/18/14 1043 11/18/14 1411 11/18/14 2212 11/19/14 0520  BP:  112/60 103/55 95/48 102/54  Pulse: 82 92 94 80  Temp: 99.5 F (37.5 C) 98.9 F (37.2 C) 98.9 F (37.2 C) 98.4 F (36.9 C)  TempSrc: Oral Oral Oral Oral  Resp: 20 20 20 20   Height:      Weight:      SpO2: 96% 93% 95% 97%     General: Adult  female, flat affect, No acute distress  HEENT: NCAT, MMM  Cardiovascular: RRR, nl S1, S2 no mrg, 2+ pulses, warm extremities  Respiratory: Diminished at the deep right base, otherwise CTAB, no increased WOB  Abdomen: NABS, soft, NT/ND  MSK: Normal tone and bulk, no LEE. Mild TTP over the lateral right lower back without bruising, rash, mass, or fluctuance  Neuro: Grossly intact  Skin: Bandage over right flank/back is c/d/i  Discharge Instructions      Discharge Instructions    Call MD for:  difficulty breathing, headache or visual disturbances    Complete by:  As directed      Call MD for:  extreme fatigue    Complete by:  As directed      Call MD for:  hives    Complete by:  As directed      Call MD for:  persistant dizziness or light-headedness    Complete by:  As directed      Call MD for:  persistant nausea and vomiting    Complete by:  As directed      Call MD for:  redness, tenderness, or signs of infection (pain, swelling, redness, odor or green/yellow discharge around incision site)    Complete by:  As directed      Call MD for:  severe uncontrolled pain    Complete by:  As directed      Call MD for:  temperature >100.4    Complete by:  As directed      Diet general    Complete by:  As directed      Discharge instructions    Complete by:  As directed   You were hospitalized with fever and shortness of breath.  You may have a viral infection or cold, however, your fever got better quickly with antibiotics.  Please take levofloxacin to complete a total of 7 days of antibiotics (including time in the hospital).  Your next dose is due tomorrow morning.  Take florastor to reduce your risk of C. Diff diarrhea.  If you have fevers, chills, difficulty breathing, severe pain, nausea, vomiting, diarrhea, dehydration, seek immediate medical attention or call 911.  If any of your tests becomes positive in the next few days, I will call you with further instructions.      Increase activity slowly    Complete by:  As directed             Medication List    TAKE these medications        cholecalciferol 1000 UNITS tablet  Commonly known as:  VITAMIN D  Take 2,000 Units by mouth 2 (two) times daily.     fexofenadine 180 MG tablet  Commonly known as:  ALLEGRA  Take 180 mg by mouth daily as needed for allergies or rhinitis.     ibuprofen 200 MG tablet  Commonly known as:  ADVIL,MOTRIN  Take 400 mg by mouth every 6 (six) hours as needed for headache or moderate pain.     levofloxacin 750 MG tablet  Commonly known as:  LEVAQUIN  Take 1 tablet (750 mg total)  by mouth daily.     levothyroxine 175 MCG tablet  Commonly known as:  SYNTHROID, LEVOTHROID  Take 175 mcg by mouth daily before breakfast.     lidocaine-prilocaine cream  Commonly known as:  EMLA  Apply to affected area once     LORazepam 0.5 MG tablet  Commonly known as:  ATIVAN  Take 1 tablet (0.5 mg total) by mouth at bedtime as needed (Nausea or vomiting).     NEULASTA 6 MG/0.6ML injection  Generic drug:  pegfilgrastim  Inject 6 mg into the skin once. Patch delivery system after chemo treatments     ondansetron 8 MG tablet  Commonly known as:  ZOFRAN  Take 1 tablet (8 mg total) by mouth 2 (two) times daily. Start the day after chemo for 2 days. Then take as needed for nausea or vomiting.     prochlorperazine 10 MG tablet  Commonly known as:  COMPAZINE  Take 1 tablet (10 mg total) by mouth every 6 (six) hours as needed (Nausea or vomiting).     saccharomyces boulardii 250 MG capsule  Commonly known as:  FLORASTOR  Take 1 capsule (250 mg total) by mouth 2 (two) times daily.     VITAMIN C PO  Take 1 tablet by mouth at bedtime.       Follow-up Information    Schedule an appointment as soon as possible for a visit with [name], MD.   Specialty:  Family Medicine   Why:  As needed   Contact information:   [place]  [id number] [phone]       Follow up with Chauncey Cruel, MD. Schedule an appointment as soon as possible for a visit in 2 weeks.   Specialty:  Oncology   Contact information:   [place] [address] [phone]        The results of significant diagnostics from this hospitalization (including imaging, microbiology, ancillary and laboratory) are listed below for reference.    Significant Diagnostic Studies: Dg Chest 1 View  10/29/2014   CLINICAL DATA:  Post thoracentesis chest radiograph.  Cough.  EXAM: CHEST  1 VIEW  COMPARISON:  CT 10/28/2014.  FINDINGS: No pneumothorax. Small RIGHT pleural effusion with consolidation at the RIGHT base, probably representing atelectatic lung in the setting of previously seen large effusion. No LEFT pleural effusion. RIGHT axillary dissection clips present.  IMPRESSION: 1. No pneumothorax status post RIGHT thoracentesis. 2. Residual collapse/consolidation of the RIGHT base.   Electronically Signed   By: Dereck Ligas M.D.   On: 10/29/2014 15:38   Dg Chest 2 View  11/18/2014   CLINICAL DATA:  Right breast cancer. Recent right thoracentesis. Fever and shortness of breath.  EXAM: CHEST  2 VIEW  COMPARISON:  11/17/2014  FINDINGS: Improved aeration at the right lung base compatible with recent thoracentesis. Right basilar densities are compatible with atelectasis and residual pleural fluid. Negative for pneumothorax. There is a left jugular Port-A-Cath. The catheter tip is in the lower SVC region. Left lung is clear. Heart size is normal.  IMPRESSION: Improved aeration in the right lung compatible with recent thoracentesis. Right basilar densities are compatible with atelectasis or residual pleural fluid. Negative for pneumothorax.  Port-A-Cath as described.   Electronically Signed   By: Markus Daft M.D.   On: 11/18/2014 14:48   Ct Head W Wo Contrast  10/28/2014   CLINICAL DATA:  Breast cancer.  Visual changes.  EXAM: CT HEAD WITHOUT AND  WITH CONTRAST  TECHNIQUE: Contiguous axial images were obtained from the base of the skull through the vertex without and with intravenous contrast  CONTRAST:  112mL OMNIPAQUE IOHEXOL 300 MG/ML  SOLN  COMPARISON:  None.  FINDINGS: Ventricle size normal. Negative for acute or chronic ischemia. Negative for mass or edema. Negative for hemorrhage  Normal enhancement following contrast infusion.  Skull is normal.  IMPRESSION: Negative   Electronically Signed   By: Franchot Gallo M.D.   On: 10/28/2014 15:33   Ct Soft Tissue Neck W Contrast  10/28/2014   CLINICAL DATA:  Breast cancer. Small palpable lymph nodes right supraclavicular region.  EXAM: CT NECK WITH CONTRAST  TECHNIQUE: Multidetector CT imaging of the neck was performed using the standard protocol following the bolus administration of intravenous contrast.  CONTRAST:  165mL OMNIPAQUE IOHEXOL 300 MG/ML  SOLN  COMPARISON:  PET-CT 04/19/2012  FINDINGS: Pharynx and larynx: Negative. Well-defined density lateral to the right molars consistent with a lozenge or tablet. Negative larynx.  Salivary glands: Negative  Thyroid: Small thyroid gland without mass lesion.  Lymph nodes: Right supraclavicular lymph nodes are enlarged with heterogeneous enhancement compatible with metastatic disease. Multiple lymph nodes are seen around the right jugular vein measuring up to 12 mm. Right supraclavicular node 15 mm.  Left supraclavicular lymph node measures 9 x 20 mm and is most consistent with metastatic disease. Left posterior superior axillary node measures 11 mm and appears to represent a pathologic node. Left level 2 node is 5 mm and is indeterminate. Small posterior lymph nodes are present on the left and are indeterminate. Right level 2 node measures 6 mm and is indeterminate.  Vascular: Jugular vein patent bilaterally. Carotid artery patent bilaterally.  Limited intracranial: Negative  Visualized orbits: Negative  Mastoids and visualized paranasal sinuses: Negative   Skeleton: Disc degeneration and spondylosis C5-6. No bony lesion identified.  Upper chest: Large right pleural effusion. Atelectasis in the right upper lobe.  IMPRESSION: Bilateral supraclavicular adenopathy compatible metastatic disease. Small level 2 and level 5 nodes are present bilaterally which could be due to metastatic disease as well.  Large right pleural effusion, worrisome for malignancy. PET is recommended for further evaluation.   Electronically Signed   By: Franchot Gallo M.D.   On: 10/28/2014 15:48   Mr Jeri Cos IH Contrast  11/16/2014   CLINICAL DATA:  Breast cancer. Staging. No reported neurologic symptoms.  EXAM: MRI HEAD WITHOUT AND WITH CONTRAST  TECHNIQUE: Multiplanar, multiecho pulse sequences of the brain and surrounding structures were obtained without and with intravenous contrast.  CONTRAST:  MultiHance 17 mL.  COMPARISON:  CT head 10/28/2014.  FINDINGS: No acute stroke, acute hemorrhage, hydrocephalus, or extra-axial fluid. Normal cerebral volume. No significant white matter disease.  No definite osseous metastatic disease. Upper cervical region unremarkable. Trapped fluid in the RIGHT petrous apex, non worrisome.  Post infusion, two metastatic lesions are identified. The larger lesion is in the RIGHT cerebellar tonsil, 4 mm diameter, adjacent to the foramen of Luschka (image 13 series 11). A smaller lesion is seen in the RIGHT lateral cerebellum, 3 mm diameter, (image 19 series 11), adjacent to a superiorly draining cerebellar developmental venous anomaly. No mass effect or significant surrounding edema. No other definite intracranial metastatic lesions are seen. Incidental additional second DVA RIGHT medial frontal cortex, seen on image 41 series 11.  Major dural venous sinuses are patent. Negative orbits. No sinus air-fluid level. Calvarium and scalp soft tissues grossly unremarkable  IMPRESSION: Metastatic disease to the  RIGHT cerebellum including a 4 mm tonsillar and 3 mm cerebellar  hemisphere metastasis. No significant edema or mass effect from either.  Findings discussed with ordering provider.   Electronically Signed   By: Staci Righter M.D.   On: 11/16/2014 09:08   Nm Pet Image Restag (ps) Skull Base To Thigh  11/06/2014   CLINICAL DATA:  Subsequent treatment strategy for breast cancer. Restaging examination. History of right-sided breast cancer status post right-sided modified radical mastectomy 11/18/2012.  EXAM: NUCLEAR MEDICINE PET SKULL BASE TO THIGH  TECHNIQUE: 8.8 mCi F-18 FDG was injected intravenously. Full-ring PET imaging was performed from the skull base to thigh after the radiotracer. CT data was obtained and used for attenuation correction and anatomic localization.  FASTING BLOOD GLUCOSE:  Value: 107 mg/dl  COMPARISON:  Chest CT 04/20/2014.  PET-CT 04/19/2012.  FINDINGS: NECK  Multiple borderline and mildly enlarged low cervical and supraclavicular lymph nodes which are markedly hypermetabolic bilaterally (right greater than left).  CHEST  Innumerable borderline enlarged and mildly enlarged hypermetabolic mediastinal and bilateral hilar lymph nodes are hypermetabolic. The right hilar nodal tissue is contiguous with parenchymal mass-like opacities involving the right lower lobe and to a lesser extent the right middle lobe. There are several air bronchograms throughout this region, however, given the general mass-like appearance of this area and the associated profound septal thickening which extends into the surrounding lung parenchyma and is somewhat nodular in appearance, this entire area is favored to be predominantly related to lymphangitic spread of tumor (in addition to some associated postobstructive changes), and this region is diffusely hypermetabolic (SUVmax = 65.7). Associated with this is a moderate right-sided pleural effusion, and there is some associated pleural hypermetabolism in the base of the right hemithorax (SUVmax = 10.0) indicative of a malignant  pleural effusion. Multiple pulmonary nodules are noted in the lungs bilaterally, which demonstrate hypermetabolism, compatible with additional metastatic foci.  ABDOMEN/PELVIS  Several areas of hypermetabolism are noted in the liver, but are not readily detectable on the CT images. The largest focus of hypermetabolism is in the superior aspect of segment 8 (SUVmax = 6.3). Gallbladder, pancreas, spleen, left adrenal gland and bilateral kidneys are unremarkable in appearance. There is a small focus of hypermetabolism in the right adrenal gland (SUVmax = 10.3). In addition, there are several borderline enlarged upper abdominal lymph nodes, most evident in the celiac axis which are hypermetabolic (SUVmax = 8.4-6.9). Mild atherosclerosis throughout the abdominal and pelvic vasculature. No significant volume of ascites. No pneumoperitoneum. No pathologic distention of small bowel or colon. Uterus and ovaries are unremarkable in appearance. Status post right modified radical mastectomy and axillary lymph node dissection.  SKELETON  Multifocal skeletal hypermetabolism, compatible with widespread metastatic disease to the bones. Some of the larger lesions include diffuse hypermetabolism and subtle sclerosis in T9 vertebral body (SUVmax = 15.4), a subtly sclerotic hypermetabolic lesion in the inferior aspect of the sternum (SUVmax = 13.4), multiple rib lesions, including the posterior aspect of the left seventh rib, small hypermetabolic focus in the periphery of the left humeral head, and multiple sclerotic lesions in the ileum bilaterally, largest of which is on the right measuring approximately 1.9 cm (SUVmax = 19.0).  IMPRESSION: 1. Today's study demonstrates widespread metastatic disease throughout the neck, chest, abdomen and pelvis, with widespread pulmonary metastases, including a large volume of lymphangitic spread of disease in the right middle and lower lobes, a malignant right pleural effusion, multiple hepatic  lesions, a right adrenal metastasis, numerous osseous lesions, and  widespread lymphadenopathy, as detailed above. These results were discussed by telephone at the time of interpretation on 11/06/2014 at 3:10 pm to Dr. Lurline Del, who verbally acknowledged these results.   Electronically Signed   By: Vinnie Langton M.D.   On: 11/06/2014 15:29   US Biopsy  11/06/2014   CLINICAL DATA:  49 year old female with a history of breast carcinoma, now with possible recurrence. She has been referred for percutaneous biopsy of cervical lymph node.  EXAM: ULTRASOUND GUIDED CORE BIOPSY OF CERVICAL LYMPH NODE  MEDICATIONS: 1.5 mg IV Versed; 75 mcg IV Fentanyl  Total Moderate Sedation Time: 14  PROCEDURE: The procedure, risks, benefits, and alternatives were explained to the patient. Questions regarding the procedure were encouraged and answered. The patient understands and consents to the procedure.  Ultrasound survey was performed with images stored and sent to PACs.  The right neck was prepped with Betadine in a sterile fashion, and a sterile drape was applied covering the operative field. A sterile gown and sterile gloves were used for the procedure. Local anesthesia was provided with 1% Lidocaine.  Ultrasound guidance was then used to infiltrate the skin and subcutaneous tissues with 1% lidocaine, and then a small stab incision was made at the base of the right neck with an 11 blade scalpel. A biopsy gun (18 gauge) was then advanced into the pathologic note at the base of the right neck, with 3 subsequent core biopsy achieved under ultrasound guidance.  The patient tolerated the procedure well and remained hemodynamically stable throughout.  No complications were encountered and no significant blood loss was encountered.  Small bandage was placed after the procedure.  Samples were placed into formalin for transportation to the lab.  Final image was stored after biopsy.  Patient tolerated the procedure well and remained  hemodynamically stable throughout.  COMPLICATIONS: None.  FINDINGS: Ultrasound survey demonstrates multiple abnormal appearing lymph nodes/nodules within the right supraclavicular region and cervical region.  Images during the case demonstrate placement of biopsy gun into right cervical/supraclavicular lymph node.  Final image demonstrates no complicating features.  IMPRESSION: Status post ultrasound-guided biopsy of right-sided cervical/ supraclavicular lymph nodes, with tissue specimen sent to pathology for complete histopathologic analysis.  Signed,  Dulcy Fanny. Earleen Newport, DO  [facility]  [facility]   Electronically Signed   By: Corrie Mckusick D.O.   On: 11/06/2014 14:23   Ir Fluoro Guide Cv Line Left  11/12/2014   CLINICAL DATA:  49 year old with metastatic breast cancer.  EXAM: FLUOROSCOPIC AND ULTRASOUND GUIDED PLACEMENT OF A SUBCUTANEOUS PORT.  Physician: Stephan Minister. Henn, MD  FLUOROSCOPY TIME:  2 minutes and 18 seconds.  18 mGy  MEDICATIONS AND MEDICAL HISTORY: Versed 4.0 mg, Fentanyl 50 mcg. A radiology nurse monitored the patient for moderate sedation.  As antibiotic prophylaxis, Ancef 2 gm was ordered pre-procedure and administered intravenously within one hour of incision.  ANESTHESIA/SEDATION: Moderate sedation time: 47 minutes  PROCEDURE: The risks of the procedure were explained to the patient. Informed consent was obtained. Patient was placed supine on the interventional table. Ultrasound confirmed a patent left internal jugular vein. The left chest and neck were cleaned with a skin antiseptic and a sterile drape was placed. Maximal barrier sterile technique was utilized including caps, mask, sterile gowns, sterile gloves, sterile drape, hand hygiene and skin antiseptic. The left neck was anesthetized with 1% lidocaine. Small incision was made in the left neck with a blade. Micropuncture set was placed in the left IJ  with ultrasound guidance. The left chest  was anesthetized with 1% lidocaine with epinephrine. #15 blade was used to make an incision and a subcutaneous port pocket was formed. [place] was selected. Subcutaneous tunnel was formed with a stiff tunneling device. The port catheter was brought through the subcutaneous tunnel. The micropuncture set was exchanged for a peel-away sheath. The catheter was placed through the peel-away sheath and the tip was positioned at the superior cavoatrial junction. Catheter was attached to the port and placed within the subcutaneous pocket. Catheter placement was confirmed with fluoroscopy. The port was accessed and flushed with heparinized saline. The port pocket was closed using two layers of absorbable sutures and Dermabond. The vein skin site was closed using a single layer of absorbable suture and Dermabond. Sterile dressings were applied. Patient tolerated the procedure well without an immediate complication. Ultrasound and fluoroscopic images were taken and saved for this procedure.  Estimated blood loss: Minimal  COMPLICATIONS: None  IMPRESSION: Placement of a subcutaneous port device. Catheter tip at the superior cavoatrial junction and ready to be used.   Electronically Signed   By: Markus Daft M.D.   On: 11/12/2014 16:00   Ir US Guide Vasc Access Left  11/12/2014   CLINICAL DATA:  49 year old with metastatic breast cancer.  EXAM: FLUOROSCOPIC AND ULTRASOUND GUIDED PLACEMENT OF A SUBCUTANEOUS PORT.  Physician: Stephan Minister. Henn, MD  FLUOROSCOPY TIME:  2 minutes and 18 seconds.  18 mGy  MEDICATIONS AND MEDICAL HISTORY: Versed 4.0 mg, Fentanyl 50 mcg. A radiology nurse monitored the patient for moderate sedation.  As antibiotic prophylaxis, Ancef 2 gm was ordered pre-procedure and administered intravenously within one hour of incision.  ANESTHESIA/SEDATION: Moderate sedation time: 47 minutes  PROCEDURE: The risks of the procedure were explained to the patient. Informed consent was obtained. Patient was placed  supine on the interventional table. Ultrasound confirmed a patent left internal jugular vein. The left chest and neck were cleaned with a skin antiseptic and a sterile drape was placed. Maximal barrier sterile technique was utilized including caps, mask, sterile gowns, sterile gloves, sterile drape, hand hygiene and skin antiseptic. The left neck was anesthetized with 1% lidocaine. Small incision was made in the left neck with a blade. Micropuncture set was placed in the left IJ with ultrasound guidance. The left chest was anesthetized with 1% lidocaine with epinephrine. #15 blade was used to make an incision and a subcutaneous port pocket was formed. [place] was selected. Subcutaneous tunnel was formed with a stiff tunneling device. The port catheter was brought through the subcutaneous tunnel. The micropuncture set was exchanged for a peel-away sheath. The catheter was placed through the peel-away sheath and the tip was positioned at the superior cavoatrial junction. Catheter was attached to the port and placed within the subcutaneous pocket. Catheter placement was confirmed with fluoroscopy. The port was accessed and flushed with heparinized saline. The port pocket was closed using two layers of absorbable sutures and Dermabond. The vein skin site was closed using a single layer of absorbable suture and Dermabond. Sterile dressings were applied. Patient tolerated the procedure well without an immediate complication. Ultrasound and fluoroscopic images were taken and saved for this procedure.  Estimated blood loss: Minimal  COMPLICATIONS: None  IMPRESSION: Placement of a subcutaneous port device. Catheter tip at the superior cavoatrial junction and ready to be used.   Electronically Signed   By: Markus Daft M.D.   On: 11/12/2014 16:00   Dg Chest  Portable 1 View  11/17/2014   CLINICAL DATA:  Fever. Cough and shortness of breath for 2 weeks. History of metastatic breast cancer.  EXAM: PORTABLE CHEST - 1  VIEW  COMPARISON:  PET-CT 11/06/2014, chest radiographs 10/29/2014  FINDINGS: Tip of the accessed left chest port in the mid SVC. Moderate-large right pleural effusion occupying the lower 1/2 of right hemithorax. No pneumothorax. There is adjacent opacity at the right lung base, likely combination of atelectasis and known metastatic disease. No consolidation in the aerated right upper or left lung. The heart size and mediastinal contours are normal for technique.  IMPRESSION: Moderate to large right pleural effusion occupying the lower 1/2 of right hemithorax. Upper right lung and left lung are clear.   Electronically Signed   By: Jeb Levering M.D.   On: 11/17/2014 22:43   Ir Thoracentesis Asp Pleural Space W/img Guide  11/18/2014   INDICATION: 49 year old female with a history of breast carcinoma, right-sided. She has been referred for a ultrasound-guided paracentesis.  EXAM: IR THORACENTESIS ASP PLEURAL SPACE W/IMG GUIDE  COMPARISON:  11/12/2014, 11/06/2014, 10/29/2014  MEDICATIONS: The patient is currently admitted to the hospital and receiving intravenous antibiotics. The antibiotics were administered within an appropriate time frame prior to the initiation of the procedure.  ANESTHESIA/SEDATION: None  CONTRAST:  None  COMPLICATIONS: None  PROCEDURE: Informed written consent was obtained from the patient after a discussion of the risks, benefits and alternatives to treatment. The patient was seated on a gantry in the fluoroscopy suite, and ultrasound survey of the posterior right thorax was performed with images stored and sent to PACs.  The patient was then prepped and draped in the usual sterile fashion using Betadine prep.  Once the patient was prepped and draped sterilely, the skin and subcutaneous tissues were generously infiltrated with 1% lidocaine for local anesthesia the level of the pleura.  A Yueh needle was advanced into the pleural space with confirmation by aspiration of pleural fluid. The  UE catheter was advanced into the space in needle was removed.  Approximately 1100 cc of thin yellow fluid were aspirated. A sample was sent to the lab for pre ordered labs.  Catheter was removed at the completion, and the patient tolerated the procedure well.  No complications were encountered and no significant blood loss was encountered.  IMPRESSION: Status post ultrasound-guided right-sided thoracentesis, with sample sent the lab for analysis. Approximately 1100 cc of thin yellow fluid was aspirated.  Signed,  Dulcy Fanny. Earleen Newport, DO  [facility]  [facility]   Electronically Signed   By: Corrie Mckusick D.O.   On: 11/18/2014 13:29   US Thoracentesis Asp Pleural Space W/img Guide  10/29/2014   INDICATION: Patient with history of right breast cancer; now with some dyspnea with exertion and right pleural effusion. Request is made for diagnostic and therapeutic right thoracentesis.  EXAM: ULTRASOUND GUIDED DIAGNOSTIC AND THERAPEUTIC RIGHT THORACENTESIS  COMPARISON:  None.  MEDICATIONS: None  COMPLICATIONS: None immediate  TECHNIQUE: Informed written consent was obtained from the patient after a discussion of the risks, benefits and alternatives to treatment. A timeout was performed prior to the initiation of the procedure.  Initial ultrasound scanning demonstrates a moderate to large right pleural effusion. The lower chest was prepped and draped in the usual sterile fashion. 1% lidocaine was used for local anesthesia.  An ultrasound image was saved for documentation purposes. A 6 Fr Safe-T-Centesis catheter was introduced. The thoracentesis was performed. The catheter was removed  and a dressing was applied. The patient tolerated the procedure well without immediate post procedural complication. The patient was escorted to have an upright chest radiograph.  FINDINGS: A total of approximately 1 liter of hazy, yellow fluid was removed. Requested samples were sent to the  laboratory.  IMPRESSION: Successful ultrasound-guided diagnostic and therapeutic right sided thoracentesis yielding 1 liter of pleural fluid.  Read by: Rowe Robert, PA-C   Electronically Signed   By: Lucrezia Europe M.D.   On: 10/29/2014 15:39    Microbiology: Recent Results (from the past 240 hour(s))  Blood Culture (routine x 2)     Status: None (Preliminary result)   Collection Time: 11/17/14 10:05 PM  Result Value Ref Range Status   Specimen Description BLOOD CENTRAL LINE  Final   Special Requests BOTTLES DRAWN AEROBIC AND ANAEROBIC 5CC  Final   Culture   Final    NO GROWTH < 24 HOURS Performed at [facility]    Report Status PENDING  Incomplete  Blood Culture (routine x 2)     Status: None (Preliminary result)   Collection Time: 11/17/14 10:14 PM  Result Value Ref Range Status   Specimen Description BLOOD LEFT HAND  Final   Special Requests BOTTLES DRAWN AEROBIC AND ANAEROBIC 2CC  Final   Culture   Final    NO GROWTH < 24 HOURS Performed at [facility]    Report Status PENDING  Incomplete  Urine culture     Status: None   Collection Time: 11/18/14  2:26 AM  Result Value Ref Range Status   Specimen Description URINE, RANDOM  Final   Special Requests NONE  Final   Culture   Final    NO GROWTH 1 DAY Performed at [facility]    Report Status 11/19/2014 FINAL  Final  Culture, body fluid-bottle     Status: None (Preliminary result)   Collection Time: 11/18/14  1:03 PM  Result Value Ref Range Status   Specimen Description FLUID RIGHT PLEURAL  Final   Special Requests BOTTLES DRAWN AEROBIC AND ANAEROBIC 10CC  Final   Culture PENDING  Incomplete   Report Status PENDING  Incomplete     Labs: Basic Metabolic Panel:  Recent Labs Lab 11/12/14 1310 11/16/14 1224 11/17/14 2205 11/18/14 0550 11/19/14 0510  NA 139 141 136 139 138  K 3.5 4.0 3.5 3.6 3.2*  CL 100*  --  101 103 104  CO2 28 31* 26 28 27   GLUCOSE 105* 95 109* 105* 104*  BUN 11 8.7 15 13 7    CREATININE 0.59 0.7 0.58 0.58 0.59  CALCIUM 8.9 8.9 8.2* 7.7* 7.4*   Liver Function Tests:  Recent Labs Lab 11/16/14 1224 11/17/14 2205  AST 18 22  ALT 17 20  ALKPHOS 102 94  BILITOT 0.48 0.6  PROT 6.3* 6.6  ALBUMIN 3.2* 3.6   No results for input(s): LIPASE, AMYLASE in the last 168 hours. No results for input(s): AMMONIA in the last 168 hours. CBC:  Recent Labs Lab 11/13/14 0929 11/16/14 1224 11/17/14 2205 11/18/14 0550 11/19/14 0510  WBC 3.7* 3.5* 5.3 6.6 6.0  NEUTROABS 2.8 2.5 4.8  --   --   HGB 13.7 13.1 12.8 12.0 11.6*  HCT 39.9 38.9 38.3 36.4 35.1*  MCV 101.3* 101.4* 102.4* 103.1* 103.8*  PLT 179 242 208 190 155   Cardiac Enzymes: No results for input(s): CKTOTAL, CKMB, CKMBINDEX, TROPONINI in the last 168 hours. BNP: BNP (last 3 results) No results for input(s):  BNP in the last 8760 hours.  ProBNP (last 3 results) No results for input(s): PROBNP in the last 8760 hours.  CBG: No results for input(s): GLUCAP in the last 168 hours.  Time coordinating discharge: 35 minutes  Signed:  Joei Frangos  [facility] 11/19/2014, 1:09 PM

## 2014-11-20 ENCOUNTER — Ambulatory Visit: Payer: BLUE CROSS/BLUE SHIELD | Admitting: Physical Therapy

## 2014-11-20 ENCOUNTER — Ambulatory Visit: Payer: BLUE CROSS/BLUE SHIELD | Attending: Oncology | Admitting: Physical Therapy

## 2014-11-20 DIAGNOSIS — I972 Postmastectomy lymphedema syndrome: Secondary | ICD-10-CM | POA: Insufficient documentation

## 2014-11-20 NOTE — Therapy (Signed)
[place], [place], [address] Phone: [phone]   Fax:  [phone]  Physical Therapy Treatment  Patient Details  Name: Nicole Valentine MRN: [id number] Date of Birth: 1966-02-22 Referring Provider:  Chauncey Cruel, MD  Encounter Date: 11/20/2014      PT End of Session - 11/20/14 1324    Visit Number 11   PT Start Time 0815   PT Stop Time 0830   PT Time Calculation (min) 15 min   Activity Tolerance Patient tolerated treatment well   Behavior During Therapy [facility] for tasks assessed/performed      Past Medical History  Diagnosis Date  . Complication of anesthesia     her father and uncle both had very difficult time waking up-was  something they told them may be hereditory. she will find out.  [name] Hypothyroidism   . 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 Radiation 01/21/13-03/06/13    Right Breast  . Breast cancer dx'd 04-10-12-rt    Past Surgical History  Procedure Laterality Date  . Wisdom tooth extraction    . Portacath placement  04/22/2012    Procedure: INSERTION PORT-A-CATH;  Surgeon: [name], MD;  Location: [facility];  Service: General;  Laterality: Left;  [name] Modified mastectomy Right 11/18/2012    Procedure:  RIGHT MODIFIED MASTECTOMY;  Surgeon: [name], MD;  Location: [place];  Service: General;  Laterality: Right;  . Knee arthroscopy Right 11/26/2013  . Port-a-cath removal Left 12/10/2013    Procedure: MINOR REMOVAL PORT-A-CATH;  Surgeon: [name], MD;  Location: [place];  Service: General;  Laterality: Left;    There were no vitals filed for this visit.  Visit Diagnosis:  Lymphedema syndrome, postmastectomy      Subjective Assessment - 11/20/14 1317    Subjective It has been a tough week--hospitalized for fever; they couldn't find the underlying cause.  Not sure if we will go on vacation this week.  Has used  Flexitouch pump off and on, but hasn't been able to use it daily.                                                        Pertinent History Hasn't use Flexitouch daily, partly on my advice due to new medical developments, and partly due to medical complications.   Currently in Pain? --  No complaints                         [facility] Adult PT Treatment/Exercise - 11/20/14 0001    Manual Therapy   Edema Management Measurements taken of both UEs to assessment of effectiveness of use of Flexitouch pump--see other part of note for these.      Circumference measurements taken today to assess effectiveness of use of Flexitouch pump: At chest (level of axilla):  91.7 cm. At waist (level of umbilicus):  82.4 cm. Right UE:   At wrist:  16.2 cm.    Left UE at these levels: 16 cm.   At palm:  19.4 cm.        19.4 cm.   10 cm. Proximal to wrist:  23.1 cm.      22.6 cm.   20 cm. Proximal  to wrist:  28.9 cm.      27 cm.   30 cm. Proximal to wrist:  30 cm.      27.6 cm.   40 cm. Proximal to wrist:  32 cm.      31.2 cm.            Short Term Clinic Goals - 08/11/14 1300    CC Short Term Goal  #1   Title short term goals = long term goals             Long Term Clinic Goals - 11/20/14 1327    CC Long Term Goal  #1   Status Not Met   CC Long Term Goal  #2   Status Unable to assess   CC Long Term Goal  #3   Status Achieved   CC Long Term Goal  #4   Status Deferred            Plan - 11/20/14 1324    Clinical Impression Statement Patient has recently learned her breast cancer has metastasized, so has started chemo again; also had to be hospitalized for a fever this week.  She now looks good, though has lost weight since she was seen here last.  She has used her Flexitouch pump intermittently, as she was able to with other things going on.  She has shown reductions in circumference measurements, although some of this sure is due to weight loss, since both right and left  arms reduced a bit.   PT Treatment/Interventions Manual techniques   PT Next Visit Plan None; discharge.   Consulted and Agree with Plan of Care Patient        Problem List Patient Active Problem List   Diagnosis Date Noted  . Acute respiratory failure with hypoxia 11/18/2014  . 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 SIRS (systemic inflammatory response syndrome) 11/18/2014  . Brain metastases 11/16/2014  . Breast cancer metastasized to bone 11/07/2014  . Breast cancer metastasized to multiple sites 11/07/2014  . Mass of neck 09/25/2014  . Lymphedema of arm 10/03/2013  . Knee pain, right 10/03/2013  . GERD (gastroesophageal reflux disease) 10/03/2013  . History of breast cancer 10/03/2013  . Breast cancer of upper-outer quadrant of right female breast 01/28/2013  . 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   Remaining deficits: Lymphedema will continue to require management.   Education / Equipment: See above. Plan: Patient agrees to discharge.  Patient goals were partially met. Patient is being discharged due to being pleased with the current functional level.  ?????    Serafina Royals, PT 11/20/2014 1:34 PM

## 2014-11-22 LAB — CULTURE, BLOOD (ROUTINE X 2)
Culture: NO GROWTH
Culture: NO GROWTH

## 2014-11-23 LAB — CULTURE, BODY FLUID-BOTTLE: CULTURE: NO GROWTH

## 2014-11-23 LAB — CULTURE, BODY FLUID W GRAM STAIN -BOTTLE

## 2014-11-25 ENCOUNTER — Encounter (HOSPITAL_COMMUNITY): Payer: Self-pay

## 2014-11-30 ENCOUNTER — Other Ambulatory Visit: Payer: Self-pay | Admitting: Oncology

## 2014-11-30 ENCOUNTER — Telehealth: Payer: Self-pay | Admitting: Oncology

## 2014-11-30 ENCOUNTER — Ambulatory Visit (HOSPITAL_BASED_OUTPATIENT_CLINIC_OR_DEPARTMENT_OTHER): Payer: BLUE CROSS/BLUE SHIELD

## 2014-11-30 ENCOUNTER — Ambulatory Visit (HOSPITAL_BASED_OUTPATIENT_CLINIC_OR_DEPARTMENT_OTHER): Payer: BLUE CROSS/BLUE SHIELD | Admitting: Oncology

## 2014-11-30 ENCOUNTER — Other Ambulatory Visit (HOSPITAL_BASED_OUTPATIENT_CLINIC_OR_DEPARTMENT_OTHER): Payer: BLUE CROSS/BLUE SHIELD

## 2014-11-30 ENCOUNTER — Ambulatory Visit: Payer: BLUE CROSS/BLUE SHIELD

## 2014-11-30 VITALS — BP 133/67 | HR 79 | Temp 97.8°F | Resp 16 | Ht 67.0 in | Wt 176.9 lb

## 2014-11-30 DIAGNOSIS — Z171 Estrogen receptor negative status [ER-]: Secondary | ICD-10-CM

## 2014-11-30 DIAGNOSIS — Z5111 Encounter for antineoplastic chemotherapy: Secondary | ICD-10-CM

## 2014-11-30 DIAGNOSIS — C787 Secondary malignant neoplasm of liver and intrahepatic bile duct: Secondary | ICD-10-CM

## 2014-11-30 DIAGNOSIS — C7802 Secondary malignant neoplasm of left lung: Secondary | ICD-10-CM

## 2014-11-30 DIAGNOSIS — C7951 Secondary malignant neoplasm of bone: Secondary | ICD-10-CM

## 2014-11-30 DIAGNOSIS — C50411 Malignant neoplasm of upper-outer quadrant of right female breast: Secondary | ICD-10-CM | POA: Diagnosis not present

## 2014-11-30 DIAGNOSIS — C7801 Secondary malignant neoplasm of right lung: Secondary | ICD-10-CM | POA: Diagnosis not present

## 2014-11-30 DIAGNOSIS — Z95828 Presence of other vascular implants and grafts: Secondary | ICD-10-CM

## 2014-11-30 DIAGNOSIS — C778 Secondary and unspecified malignant neoplasm of lymph nodes of multiple regions: Secondary | ICD-10-CM

## 2014-11-30 DIAGNOSIS — C7971 Secondary malignant neoplasm of right adrenal gland: Secondary | ICD-10-CM

## 2014-11-30 DIAGNOSIS — C50919 Malignant neoplasm of unspecified site of unspecified female breast: Secondary | ICD-10-CM

## 2014-11-30 LAB — CBC WITH DIFFERENTIAL/PLATELET
BASO%: 0.5 % (ref 0.0–2.0)
Basophils Absolute: 0 10*3/uL (ref 0.0–0.1)
EOS%: 0.6 % (ref 0.0–7.0)
Eosinophils Absolute: 0 10*3/uL (ref 0.0–0.5)
HCT: 36.4 % (ref 34.8–46.6)
HGB: 12.2 g/dL (ref 11.6–15.9)
LYMPH%: 11.7 % — AB (ref 14.0–49.7)
MCH: 34.2 pg — ABNORMAL HIGH (ref 25.1–34.0)
MCHC: 33.6 g/dL (ref 31.5–36.0)
MCV: 101.7 fL — ABNORMAL HIGH (ref 79.5–101.0)
MONO#: 0.3 10*3/uL (ref 0.1–0.9)
MONO%: 4.9 % (ref 0.0–14.0)
NEUT%: 82.3 % — ABNORMAL HIGH (ref 38.4–76.8)
NEUTROS ABS: 4.5 10*3/uL (ref 1.5–6.5)
Platelets: 204 10*3/uL (ref 145–400)
RBC: 3.58 10*6/uL — ABNORMAL LOW (ref 3.70–5.45)
RDW: 13.9 % (ref 11.2–14.5)
WBC: 5.5 10*3/uL (ref 3.9–10.3)
lymph#: 0.6 10*3/uL — ABNORMAL LOW (ref 0.9–3.3)

## 2014-11-30 LAB — COMPREHENSIVE METABOLIC PANEL (CC13)
ALBUMIN: 3.1 g/dL — AB (ref 3.5–5.0)
ALT: 8 U/L (ref 0–55)
ANION GAP: 6 meq/L (ref 3–11)
AST: 15 U/L (ref 5–34)
Alkaline Phosphatase: 90 U/L (ref 40–150)
BILIRUBIN TOTAL: 0.52 mg/dL (ref 0.20–1.20)
BUN: 9.7 mg/dL (ref 7.0–26.0)
CHLORIDE: 105 meq/L (ref 98–109)
CO2: 30 mEq/L — ABNORMAL HIGH (ref 22–29)
Calcium: 8.9 mg/dL (ref 8.4–10.4)
Creatinine: 0.6 mg/dL (ref 0.6–1.1)
EGFR: >90 mL/min/{1.73_m2} (ref >90)
Glucose: 90 mg/dl (ref 70–140)
Potassium: 3.8 mEq/L (ref 3.5–5.1)
Sodium: 142 mEq/L (ref 136–145)
Total Protein: 6.1 g/dL — ABNORMAL LOW (ref 6.4–8.3)

## 2014-11-30 MED ORDER — PACLITAXEL PROTEIN-BOUND CHEMO INJECTION 100 MG
100.0000 mg/m2 | Freq: Once | INTRAVENOUS | Status: AC
  Administered 2014-11-30: 200 mg via INTRAVENOUS
  Filled 2014-11-30: qty 40

## 2014-11-30 MED ORDER — SODIUM CHLORIDE 0.9 % IJ SOLN
10.0000 mL | INTRAMUSCULAR | Status: DC | PRN
  Administered 2014-11-30: 10 mL
  Filled 2014-11-30: qty 10

## 2014-11-30 MED ORDER — SODIUM CHLORIDE 0.9 % IV SOLN
Freq: Once | INTRAVENOUS | Status: AC
  Administered 2014-11-30: 14:00:00 via INTRAVENOUS
  Filled 2014-11-30: qty 4

## 2014-11-30 MED ORDER — SODIUM CHLORIDE 0.9 % IJ SOLN
10.0000 mL | INTRAMUSCULAR | Status: DC | PRN
  Administered 2014-11-30: 10 mL via INTRAVENOUS
  Filled 2014-11-30: qty 10

## 2014-11-30 MED ORDER — HEPARIN SOD (PORK) LOCK FLUSH 100 UNIT/ML IV SOLN
500.0000 [IU] | Freq: Once | INTRAVENOUS | Status: AC | PRN
  Administered 2014-11-30: 500 [IU]
  Filled 2014-11-30: qty 5

## 2014-11-30 MED ORDER — SODIUM CHLORIDE 0.9 % IV SOLN
Freq: Once | INTRAVENOUS | Status: AC
  Administered 2014-11-30: 14:00:00 via INTRAVENOUS

## 2014-11-30 NOTE — Progress Notes (Signed)
And a Patient ID: Nicole Valentine, female   DOB: 1966/02/22, 49 y.o.   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 She had a mammogram performed that showed dense breasts bilaterally. Ultrasound of the right breast showed 2.3 x 1.9 cm area of abnormality with multiple abnormal lymph nodes. MRI of the bilateral breasts showed asymmetrical enhancement throughout the right breast consistent with multicentric disease. An ultrasound-guided biopsy performed. The known area of disease measured 1.9 x 1.9 x 2.3 cm. Multiple abnormal positive right axillary lymph nodes were noted. 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 Biopsy of the right axillary lymph node was positive for malignancy with extracapsular extension. Patient was originally seen by Dr. Margot Valentine Dr. Truddie Valentine and Dr. Pablo Valentine. She has elected to have a right mastectomy eventually and declined biopsies of any other areas within the breast."  She went on to receive neoadjuvant chemotherapy and attained a complete pathologic response, as documented below  [name] noted a very small right supraclavicular mass 09/25/2014, which she brought to our attention. She was having some allergy symptoms at the time so we decided to reevaluate this after a few weeks and on 10/22/2014 as the mass persisted we obtained a restaging neck CT scan, 10/28/2014. There was bilateral supraclavicular adenopathy, but also a large right pleural effusion was incidentally noted. We proceeded to right thoracentesis 10/29/2014, and a liter of  hazy yellow fluid was removed. Cytology from this procedure ([id number]) showed malignant cells consistent with adenocarcinoma, estrogen and progesterone receptor negative, HER-2 not amplified, with a signals ratio 1.12 and the number per cell being 2.75, and with an MIB-1 of 80%. I called Nicole Valentine with these results and set her up for a PET scan performed 11/06/2014, which shows widespread metastatic disease involving particularly the right lung, liver, and bones, but also the left lung, right adrenal, and multiple lymph node areas.  Her subsequent history is as detailed below.  INTERVAL HISTORY: Nicole Valentine returns today for follow-up of her metastatic breast cancer accompanied by her husband Nicole Valentine. Today is day 1 cycle 2 of 8 planned cycles of Abraxane, given days 1 and 8 of each 21 day cycle, with onpro support.  REVIEW OF S/YSTEMS: The main problem she is having is fatigue. She is not exercising. The just got an exercise bike so she may start using that. She used to take walks but is not doing that. She does not have any taste alteration, but has no appetite. She makes herself eat. She hydrate yourself well. She had no nausea from the earlier treatments. She's had no peripheral neuropathy. She is having no unusual headaches, or balance issues. She does think she needs reading glasses. A detailed review of systems today was otherwise stable  PAST MEDICAL HISTORY: Past Medical History  Diagnosis Date  . Complication of anesthesia     her father and uncle both had very difficult time waking up-was  something they told them may be hereditory. she will find out.  [name] Hypothyroidism   . 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 Radiation 01/21/13-03/06/13    Right  Breast  . Breast cancer dx'd 04-10-12-rt    PAST SURGICAL HISTORY: Past Surgical History  Procedure Laterality Date  . Wisdom tooth extraction    . Portacath placement  04/22/2012    Procedure: INSERTION PORT-A-CATH;  Surgeon: Nicole Lasso, MD;  Location: [facility];  Service: General;  Laterality: Left;  [name] Modified mastectomy Right 11/18/2012    Procedure:  RIGHT MODIFIED MASTECTOMY;  Surgeon: Nicole Lasso, MD;  Location: [facility];  Service: General;  Laterality: Right;  . Knee arthroscopy Right 11/26/2013  . Port-a-cath removal Left 12/10/2013    Procedure: MINOR REMOVAL PORT-A-CATH;  Surgeon: Nicole Hector, MD;  Location: [facility];  Service: General;  Laterality: Left;    FAMILY HISTORY Family History  Problem Relation Age of Onset  . Lung cancer Maternal Grandfather 30    smoker  . Prostate cancer Maternal Grandfather 90  . Throat cancer Other     Great Aunt x 2  . Liver cancer Other     Maternal Great Grandmother  . Melanoma Maternal Uncle 81  . Brain cancer Cousin 11    non-malignant  the patient's parents are living, in their early 77s. The patient has 2 brothers, no sisters. The patient's mother was diagnosed with breast cancer, HER-2 positive, in 2014 in [place].  GYNECOLOGIC HISTORY:   (Updated 10/03/2013) Menarche age 48, first live birth age 89. She is GX P4. LMP January 2014. 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  The children are currently ages 13, 70, 93, and 7. Her husband, Nicole Valentine, is a Customer service manager. They attend the [employer] DIRECTIVES: The patient's husband is her HCPOA   HEALTH MAINTENANCE:  (Updated 10/03/2013) History  Substance Use Topics  . Smoking status: Never Smoker   . Smokeless tobacco: Never Used  . Alcohol Use: No     Colonoscopy: Never  PAP: November 2013/Dr. Smith  Bone density: January 2015, Nicole Valentine, normal  Lipid panel:  Not on file  Allergies  Allergen Reactions  . Almond Meal Rash  . Other Rash    LOTIONS    Current Outpatient Prescriptions  Medication Sig Dispense Refill  . Ascorbic Acid  (VITAMIN C PO) Take 1 tablet by mouth at bedtime.     . cholecalciferol (VITAMIN D) 1000 UNITS tablet Take 2,000 Units by mouth 2 (two) times daily.     . fexofenadine (ALLEGRA) 180 MG tablet Take 180 mg by mouth daily as needed for allergies or rhinitis.    [name] ibuprofen (ADVIL,MOTRIN) 200 MG tablet Take 400 mg by mouth every 6 (six) hours as needed for headache or moderate pain.    [name] levofloxacin (LEVAQUIN) 750 MG tablet Take 1 tablet (750 mg total) by mouth daily. 4 tablet 0  . levothyroxine (SYNTHROID, LEVOTHROID) 175 MCG tablet Take 175 mcg by mouth daily before breakfast.    . lidocaine-prilocaine (EMLA) cream Apply to affected area once 30 g 3  . LORazepam (ATIVAN) 0.5 MG tablet Take 1 tablet (0.5 mg total) by mouth at bedtime as needed (Nausea or vomiting). 30 tablet 0  . ondansetron (ZOFRAN) 8 MG tablet Take 1 tablet (8 mg total) by mouth 2 (two) times daily. Start the day after chemo for 2 days. Then take as needed for nausea or vomiting. 30 tablet 1  . pegfilgrastim (NEULASTA) 6 MG/0.6ML injection Inject 6  mg into the skin once. Patch delivery system after chemo treatments    . prochlorperazine (COMPAZINE) 10 MG tablet Take 1 tablet (10 mg total) by mouth every 6 (six) hours as needed (Nausea or vomiting). 30 tablet 1  . saccharomyces boulardii (FLORASTOR) 250 MG capsule Take 1 capsule (250 mg total) by mouth 2 (two) times daily. 60 capsule 2   No current facility-administered medications for this visit.      OBJECTIVE: Middle-aged white woman in no acute distress Filed Vitals:   11/30/14 1143  BP: 133/67  Pulse: 79  Temp: 97.8 F (36.6 C)  Resp: 16     Body mass index is 27.7 kg/(m^2).    ECOG FS:1 - Symptomatic but completely ambulatory Filed Weights   11/30/14 1143  Weight: 176 lb 14.4 oz (80.241 kg)   Sclerae unicteric, pupils round and equal Oropharynx clear and moist-- no thrush or other lesions The previously palpable right supraclavicular adenopathy has  resolved Lungs no rales or rhonchi Heart regular rate and rhythm Abd soft, nontender, positive bowel sounds MSK no focal spinal tenderness, no upper extremity lymphedema Neuro: nonfocal, well oriented, appropriate affect Breasts: Deferred      LAB RESULTS:     Lab Results  Component Value Date   WBC 5.5 11/30/2014   NEUTROABS 4.5 11/30/2014   HGB 12.2 11/30/2014   HCT 36.4 11/30/2014   MCV 101.7* 11/30/2014   PLT 204 11/30/2014      Chemistry      Component Value Date/Time   NA 138 11/19/2014 0510   NA 141 11/16/2014 1224   K 3.2* 11/19/2014 0510   K 4.0 11/16/2014 1224   CL 104 11/19/2014 0510   CL 101 10/18/2012 0859   CO2 27 11/19/2014 0510   CO2 31* 11/16/2014 1224   BUN 7 11/19/2014 0510   BUN 8.7 11/16/2014 1224   CREATININE 0.59 11/19/2014 0510   CREATININE 0.7 11/16/2014 1224      Component Value Date/Time   CALCIUM 7.4* 11/19/2014 0510   CALCIUM 8.9 11/16/2014 1224   ALKPHOS 94 11/17/2014 2205   ALKPHOS 102 11/16/2014 1224   AST 22 11/17/2014 2205   AST 18 11/16/2014 1224   ALT 20 11/17/2014 2205   ALT 17 11/16/2014 1224   BILITOT 0.6 11/17/2014 2205   BILITOT 0.48 11/16/2014 1224       STUDIES: Dg Chest 2 View  11/18/2014   CLINICAL DATA:  Right breast cancer. Recent right thoracentesis. Fever and shortness of breath.  EXAM: CHEST  2 VIEW  COMPARISON:  11/17/2014  FINDINGS: Improved aeration at the right lung base compatible with recent thoracentesis. Right basilar densities are compatible with atelectasis and residual pleural fluid. Negative for pneumothorax. There is a left jugular Port-A-Cath. The catheter tip is in the lower SVC region. Left lung is clear. Heart size is normal.  IMPRESSION: Improved aeration in the right lung compatible with recent thoracentesis. Right basilar densities are compatible with atelectasis or residual pleural fluid. Negative for pneumothorax.  Port-A-Cath as described.   Electronically Signed   By: Markus Daft M.D.    On: 11/18/2014 14:48   Mr Jeri Cos HG Contrast  11/16/2014   CLINICAL DATA:  Breast cancer. Staging. No reported neurologic symptoms.  EXAM: MRI HEAD WITHOUT AND WITH CONTRAST  TECHNIQUE: Multiplanar, multiecho pulse sequences of the brain and surrounding structures were obtained without and with intravenous contrast.  CONTRAST:  MultiHance 17 mL.  COMPARISON:  CT head 10/28/2014.  FINDINGS: No acute  stroke, acute hemorrhage, hydrocephalus, or extra-axial fluid. Normal cerebral volume. No significant white matter disease.  No definite osseous metastatic disease. Upper cervical region unremarkable. Trapped fluid in the RIGHT petrous apex, non worrisome.  Post infusion, two metastatic lesions are identified. The larger lesion is in the RIGHT cerebellar tonsil, 4 mm diameter, adjacent to the foramen of Luschka (image 13 series 11). A smaller lesion is seen in the RIGHT lateral cerebellum, 3 mm diameter, (image 19 series 11), adjacent to a superiorly draining cerebellar developmental venous anomaly. No mass effect or significant surrounding edema. No other definite intracranial metastatic lesions are seen. Incidental additional second DVA RIGHT medial frontal cortex, seen on image 41 series 11.  Major dural venous sinuses are patent. Negative orbits. No sinus air-fluid level. 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 No significant edema or mass effect from either.  Findings discussed with ordering provider.   Electronically Signed   By: Staci Righter M.D.   On: 11/16/2014 09:08   Nm Pet Image Restag (ps) Skull Base To Thigh  11/06/2014   CLINICAL DATA:  Subsequent treatment strategy for breast cancer. Restaging examination. History of right-sided breast cancer status post right-sided modified radical mastectomy 11/18/2012.  EXAM: NUCLEAR MEDICINE PET SKULL BASE TO THIGH   TECHNIQUE: 8.8 mCi F-18 FDG was injected intravenously. Full-ring PET imaging was performed from the skull base to thigh after the radiotracer. CT data was obtained and used for attenuation correction and anatomic localization.  FASTING BLOOD GLUCOSE:  Value: 107 mg/dl  COMPARISON:  Chest CT 04/20/2014.  PET-CT 04/19/2012.  FINDINGS: NECK  Multiple borderline and mildly enlarged low cervical and supraclavicular lymph nodes which are markedly hypermetabolic bilaterally (right greater than left).  CHEST  Innumerable borderline enlarged and mildly enlarged hypermetabolic mediastinal and bilateral hilar lymph nodes are hypermetabolic. The right hilar nodal tissue is contiguous with parenchymal mass-like opacities involving the right lower lobe and to a lesser extent the right middle lobe. There are several air bronchograms throughout this region, however, given the general mass-like appearance of this area and the associated profound septal thickening which extends into the surrounding lung parenchyma and is somewhat nodular in appearance, this entire area is favored to be predominantly related to lymphangitic spread of tumor (in addition to some associated postobstructive changes), and this region is diffusely hypermetabolic (SUVmax = 62.5). Associated with this is a moderate right-sided pleural effusion, and there is some associated pleural hypermetabolism in the base of the right hemithorax (SUVmax = 10.0) indicative of a malignant pleural effusion. Multiple pulmonary nodules are noted in the lungs bilaterally, which demonstrate hypermetabolism, compatible with additional metastatic foci.  ABDOMEN/PELVIS  Several areas of hypermetabolism are noted in the liver, but are not readily detectable on the CT images. The largest focus of hypermetabolism is in the superior aspect of segment 8 (SUVmax = 6.3). Gallbladder, pancreas, spleen, left adrenal gland and bilateral kidneys are unremarkable in appearance. There is a  small focus of hypermetabolism in the right adrenal gland (SUVmax = 10.3). In addition, there are several borderline enlarged upper abdominal lymph nodes, most evident in the celiac axis which are hypermetabolic (SUVmax = 6.3-8.9). Mild atherosclerosis throughout the abdominal and pelvic vasculature. No significant volume of ascites. No pneumoperitoneum. No pathologic distention of small bowel or colon. Uterus and ovaries are unremarkable in appearance. Status post right modified radical mastectomy and axillary lymph node dissection.  SKELETON  Multifocal skeletal hypermetabolism, compatible with widespread metastatic disease to the bones. Some of the larger lesions include diffuse hypermetabolism and subtle sclerosis in T9 vertebral body (SUVmax = 15.4), a subtly sclerotic hypermetabolic lesion in the inferior aspect of the sternum (SUVmax = 13.4), multiple rib lesions, including the posterior aspect of the left seventh rib, small hypermetabolic focus in the periphery of the left humeral head, and multiple sclerotic lesions in the ileum bilaterally, largest of which is on the right measuring approximately 1.9 cm (SUVmax = 19.0).  IMPRESSION: 1. Today's study demonstrates widespread metastatic disease throughout the neck, chest, abdomen and pelvis, with widespread pulmonary metastases, including a large volume of lymphangitic spread of disease in the right middle and lower lobes, a malignant right pleural effusion, multiple hepatic lesions, a right adrenal metastasis, numerous osseous lesions, and widespread lymphadenopathy, as detailed above. These results were discussed by telephone at the time of interpretation on 11/06/2014 at 3:10 pm to Dr. Lurline Del, who verbally acknowledged these results.   Electronically Signed   By: Vinnie Langton M.D.   On: 11/06/2014 15:29   US Biopsy  11/06/2014   CLINICAL DATA:  49 year old female with a history of breast carcinoma, now with possible recurrence. She has been  referred for percutaneous biopsy of cervical lymph node.  EXAM: ULTRASOUND GUIDED CORE BIOPSY OF CERVICAL LYMPH NODE  MEDICATIONS: 1.5 mg IV Versed; 75 mcg IV Fentanyl  Total Moderate Sedation Time: 14  PROCEDURE: The procedure, risks, benefits, and alternatives were explained to the patient. Questions regarding the procedure were encouraged and answered. The patient understands and consents to the procedure.  Ultrasound survey was performed with images stored and sent to PACs.  The right neck was prepped with Betadine in a sterile fashion, and a sterile drape was applied covering the operative field. A sterile gown and sterile gloves were used for the procedure. Local anesthesia was provided with 1% Lidocaine.  Ultrasound guidance was then used to infiltrate the skin and subcutaneous tissues with 1% lidocaine, and then a small stab incision was made at the base of the right neck with an 11 blade scalpel. A biopsy gun (18 gauge) was then advanced into the pathologic note at the base of the right neck, with 3 subsequent core biopsy achieved under ultrasound guidance.  The patient tolerated the procedure well and remained hemodynamically stable throughout.  No complications were encountered and no significant blood loss was encountered.  Small bandage was placed after the procedure.  Samples were placed into formalin for transportation to the lab.  Final image was stored after biopsy.  Patient tolerated the procedure well and remained hemodynamically stable throughout.  COMPLICATIONS: None.  FINDINGS: Ultrasound survey demonstrates multiple abnormal appearing lymph nodes/nodules within the right supraclavicular region and cervical region.  Images during the case demonstrate placement of biopsy gun into right cervical/supraclavicular lymph node.  Final image demonstrates no complicating features.  IMPRESSION: Status post ultrasound-guided biopsy of right-sided cervical/ supraclavicular lymph nodes, with tissue specimen  sent to pathology for complete histopathologic analysis.  Signed,  Dulcy Fanny. Earleen Newport, DO  [facility]  [facility]   Electronically Signed   By: Corrie Mckusick D.O.   On: 11/06/2014 14:23   Ir Fluoro Guide Cv Line Left  11/12/2014   CLINICAL DATA:  49 year old with metastatic breast cancer.  EXAM: FLUOROSCOPIC AND ULTRASOUND GUIDED PLACEMENT OF A SUBCUTANEOUS PORT.  Physician: Stephan Minister. Henn, MD  FLUOROSCOPY TIME:  2 minutes and 18 seconds.  18 mGy  MEDICATIONS AND MEDICAL HISTORY: Versed 4.0 mg, Fentanyl 50 mcg. A radiology nurse monitored the patient for moderate sedation.  As antibiotic prophylaxis, Ancef 2 gm was ordered pre-procedure and administered intravenously within one hour of incision.  ANESTHESIA/SEDATION: Moderate sedation time: 47 minutes  PROCEDURE: The risks of the procedure were explained to the patient. Informed consent was obtained. Patient was placed supine on the interventional table. Ultrasound confirmed a patent left internal jugular vein. The left chest and neck were cleaned with a skin antiseptic and a sterile drape was placed. Maximal barrier sterile technique was utilized including caps, mask, sterile gowns, sterile gloves, sterile drape, hand hygiene and skin antiseptic. The left neck was anesthetized with 1% lidocaine. Small incision was made in the left neck with a blade. Micropuncture set was placed in the left IJ with ultrasound guidance. The left chest was anesthetized with 1% lidocaine with epinephrine. #15 blade was used to make an incision and a subcutaneous port pocket was formed. [place] was selected. Subcutaneous tunnel was formed with a stiff tunneling device. The port catheter was brought through the subcutaneous tunnel. The micropuncture set was exchanged for a peel-away sheath. The catheter was placed through the peel-away sheath and the tip was positioned at the superior cavoatrial junction. Catheter was attached to  the port and placed within the subcutaneous pocket. Catheter placement was confirmed with fluoroscopy. The port was accessed and flushed with heparinized saline. The port pocket was closed using two layers of absorbable sutures and Dermabond. The vein skin site was closed using a single layer of absorbable suture and Dermabond. Sterile dressings were applied. Patient tolerated the procedure well without an immediate complication. Ultrasound and fluoroscopic images were taken and saved for this procedure.  Estimated blood loss: Minimal  COMPLICATIONS: None  IMPRESSION: Placement of a subcutaneous port device. Catheter tip at the superior cavoatrial junction and ready to be used.   Electronically Signed   By: Markus Daft M.D.   On: 11/12/2014 16:00   Ir US Guide Vasc Access Left  11/12/2014   CLINICAL DATA:  49 year old with metastatic breast cancer.  EXAM: FLUOROSCOPIC AND ULTRASOUND GUIDED PLACEMENT OF A SUBCUTANEOUS PORT.  Physician: Stephan Minister. Henn, MD  FLUOROSCOPY TIME:  2 minutes and 18 seconds.  18 mGy  MEDICATIONS AND MEDICAL HISTORY: Versed 4.0 mg, Fentanyl 50 mcg. A radiology nurse monitored the patient for moderate sedation.  As antibiotic prophylaxis, Ancef 2 gm was ordered pre-procedure and administered intravenously within one hour of incision.  ANESTHESIA/SEDATION: Moderate sedation time: 47 minutes  PROCEDURE: The risks of the procedure were explained to the patient. Informed consent was obtained. Patient was placed supine on the interventional table. Ultrasound confirmed a patent left internal jugular vein. The left chest and neck were cleaned with a skin antiseptic and a sterile drape was placed. Maximal barrier sterile technique was utilized including caps, mask, sterile gowns, sterile gloves, sterile drape, hand hygiene and skin antiseptic. The left neck was anesthetized with 1% lidocaine. Small incision was made in the left neck with a blade. Micropuncture set was placed in the left IJ with  ultrasound guidance. The left chest was anesthetized with 1% lidocaine with epinephrine. #15 blade was used to make an incision and a subcutaneous port pocket was formed. [place] was selected. Subcutaneous tunnel was formed with a stiff tunneling device. The port catheter was brought through the subcutaneous tunnel. The micropuncture set was exchanged for a peel-away sheath. The catheter  was placed through the peel-away sheath and the tip was positioned at the superior cavoatrial junction. Catheter was attached to the port and placed within the subcutaneous pocket. Catheter placement was confirmed with fluoroscopy. The port was accessed and flushed with heparinized saline. The port pocket was closed using two layers of absorbable sutures and Dermabond. The vein skin site was closed using a single layer of absorbable suture and Dermabond. Sterile dressings were applied. Patient tolerated the procedure well without an immediate complication. Ultrasound and fluoroscopic images were taken and saved for this procedure.  Estimated blood loss: Minimal  COMPLICATIONS: None  IMPRESSION: Placement of a subcutaneous port device. Catheter tip at the superior cavoatrial junction and ready to be used.   Electronically Signed   By: Markus Daft M.D.   On: 11/12/2014 16:00   Dg Chest Portable 1 View  11/17/2014   CLINICAL DATA:  Fever. Cough and shortness of breath for 2 weeks. History of metastatic breast cancer.  EXAM: PORTABLE CHEST - 1 VIEW  COMPARISON:  PET-CT 11/06/2014, chest radiographs 10/29/2014  FINDINGS: Tip of the accessed left chest port in the mid SVC. Moderate-large right pleural effusion occupying the lower 1/2 of right hemithorax. No pneumothorax. There is adjacent opacity at the right lung base, likely combination of atelectasis and known metastatic disease. No consolidation in the aerated right upper or left lung. The heart size and mediastinal contours are normal for technique.  IMPRESSION:  Moderate to large right pleural effusion occupying the lower 1/2 of right hemithorax. Upper right lung and left lung are clear.   Electronically Signed   By: Jeb Levering M.D.   On: 11/17/2014 22:43   Ir Thoracentesis Asp Pleural Space W/img Guide  11/18/2014   INDICATION: 49 year old female with a history of breast carcinoma, right-sided. She has been referred for a ultrasound-guided paracentesis.  EXAM: IR THORACENTESIS ASP PLEURAL SPACE W/IMG GUIDE  COMPARISON:  11/12/2014, 11/06/2014, 10/29/2014  MEDICATIONS: The patient is currently admitted to the hospital and receiving intravenous antibiotics. The antibiotics were administered within an appropriate time frame prior to the initiation of the procedure.  ANESTHESIA/SEDATION: None  CONTRAST:  None  COMPLICATIONS: None  PROCEDURE: Informed written consent was obtained from the patient after a discussion of the risks, benefits and alternatives to treatment. The patient was seated on a gantry in the fluoroscopy suite, and ultrasound survey of the posterior right thorax was performed with images stored and sent to PACs.  The patient was then prepped and draped in the usual sterile fashion using Betadine prep.  Once the patient was prepped and draped sterilely, the skin and subcutaneous tissues were generously infiltrated with 1% lidocaine for local anesthesia the level of the pleura.  A Yueh needle was advanced into the pleural space with confirmation by aspiration of pleural fluid. The UE catheter was advanced into the space in needle was removed.  Approximately 1100 cc of thin yellow fluid were aspirated. A sample was sent to the lab for pre ordered labs.  Catheter was removed at the completion, and the patient tolerated the procedure well.  No complications were encountered and no significant blood loss was encountered.  IMPRESSION: Status post ultrasound-guided right-sided thoracentesis, with sample sent the lab for analysis. Approximately 1100 cc of thin  yellow fluid was aspirated.  Signed,  Dulcy Fanny. Earleen Newport, DO  [facility]  [facility]   Electronically Signed   By: Corrie Mckusick D.O.   On: 11/18/2014 13:29    ASSESSMENT:  49 y.o. BRCA negative  woman with triple-negative stage IV breast cancer  (1) an status post right breast upper outer quadrant and right axillary lymph node biopsy 04/04/2012, both positive for an invasive ductal carcinoma, grade 3, triple negative, with an MIB-1 of 25%  (2) Treated neoadjuvantly with  (a) fluorouracil, cyclophosphamide, and epirubicin (at 100 mg/M2) x4 completed 06/07/2012  (b) docetaxel (75 mg/M2) for one dose, 06/21/2012, poorly tolerated  (c) carboplatin and gemcitabine given every 21 days for 6 cycles completed 10/11/2012  (3) status post right modified radical mastectomy 11/18/2012 showing a complete pathologic response--all 16 lymph nodes were benign  (a) no plans for reconstructon  (4) adjuvant radiation therapy completed 03/06/2013  METASTATIC DISEASE June 2016 (5) 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, 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  (6) staging PET scan 11/06/2014 showed significant disease in the middle and lower lobes of the Right lung, Right effusion, multiple liver and bone lesions, as well as left lung nodules, a Right adrenal nodule, and widespread hypermetabolic adenopathy  (a) brain MRI 11/16/2014 showed 2 cerebellar lesions; SRS pending  (7) abraxane day 1 and day 8 of each 21 day cycle started 11/09/2014  (8) zolendronate started 11/16/2014, to be repeated every 12 weeks  (9) Foundation 1 study sent  11/06/2014: patient's sister in law Garvin Fila following [phone])  PLAN: Janiya tolerated her first cycle of Abraxane well and is ready to start cycle 2.  I have strongly urged her to remain active, perhaps 10-20 minutes twice a day of whatever exercise she prefers. She is doing a  good job of keeping herself well hydrated and nourished. It may be even easier if she took many small meals during the day instead of planning for 2 or 3 larger meals.  We again discussed clinical trials, and I think a good time to consider this would be as she reevaluates results of her Abraxane which will be mid-August. Certainly most trials would want her to have had stable brain disease for at least a month and of course she will have many more options if she doesn't have multiple types of chemotherapy before going for trials. I think a visit at [facility] would be a good idea perhaps in a month or so to start discussing options.   Otherwise she is already scheduled to see [place] on treatment days and I have entered an order for a PET scan to be done a couple of days before she sees me before the start of cycle 4   Winton Offord C, MD   11/30/2014 11:58 AM

## 2014-11-30 NOTE — Telephone Encounter (Signed)
Sent msg to [place] to fax records to [facility] referral

## 2014-11-30 NOTE — Patient Instructions (Signed)
[facility] Discharge Instructions for Patients Receiving ChemothTo help prevent nausea and vomiting after your treatment, we encourage you to take your nausea medication.  Today you received the following chemotherapy agents: Abraxane. If you develop nausea and vomiting that is not controlled by your nausea medication, call the clinic.   BELOW ARE SYMPTOMS THAT SHOULD BE REPORTED IMMEDIATELY:  *FEVER GREATER THAN 100.5 F  *CHILLS WITH OR WITHOUT FEVER  NAUSEA AND VOMITING THAT IS NOT CONTROLLED WITH YOUR NAUSEA MEDICATION  *UNUSUAL SHORTNESS OF BREATH  *UNUSUAL BRUISING OR BLEEDING  TENDERNESS IN MOUTH AND THROAT WITH OR WITHOUT PRESENCE OF ULCERS  *URINARY PROBLEMS  *BOWEL PROBLEMS  UNUSUAL RASH Items with * indicate a potential emergency and should be followed up as soon as possible.  Feel free to call the clinic you have any questions or concerns. The clinic phone number is [phone].  Please show the [place] at check-in to the Emergency Department and triage nurse.

## 2014-11-30 NOTE — Patient Instructions (Signed)

## 2014-12-01 ENCOUNTER — Telehealth: Payer: Self-pay | Admitting: Oncology

## 2014-12-01 NOTE — Telephone Encounter (Signed)
Faxed medical records to Attn:Tonja @ Dr. [name] office [phone].  August 10 @ 10:40 w/Dr. [name]. Per Joen Laura will contact patient with appt.

## 2014-12-03 ENCOUNTER — Ambulatory Visit
Admission: RE | Admit: 2014-12-03 | Discharge: 2014-12-03 | Disposition: A | Discharge status: Home / Self Care | Payer: BLUE CROSS/BLUE SHIELD | Source: Ambulatory Visit | Attending: Radiation Oncology | Admitting: Radiation Oncology

## 2014-12-03 ENCOUNTER — Telehealth: Payer: Self-pay | Admitting: *Deleted

## 2014-12-03 ENCOUNTER — Ambulatory Visit (HOSPITAL_BASED_OUTPATIENT_CLINIC_OR_DEPARTMENT_OTHER): Payer: BLUE CROSS/BLUE SHIELD

## 2014-12-03 VITALS — BP 116/67 | HR 84 | Temp 98.0°F

## 2014-12-03 DIAGNOSIS — C7802 Secondary malignant neoplasm of left lung: Secondary | ICD-10-CM | POA: Diagnosis not present

## 2014-12-03 DIAGNOSIS — C7931 Secondary malignant neoplasm of brain: Secondary | ICD-10-CM

## 2014-12-03 DIAGNOSIS — C778 Secondary and unspecified malignant neoplasm of lymph nodes of multiple regions: Secondary | ICD-10-CM

## 2014-12-03 DIAGNOSIS — C7801 Secondary malignant neoplasm of right lung: Secondary | ICD-10-CM | POA: Diagnosis not present

## 2014-12-03 DIAGNOSIS — C787 Secondary malignant neoplasm of liver and intrahepatic bile duct: Secondary | ICD-10-CM

## 2014-12-03 DIAGNOSIS — Z95828 Presence of other vascular implants and grafts: Secondary | ICD-10-CM

## 2014-12-03 DIAGNOSIS — C50411 Malignant neoplasm of upper-outer quadrant of right female breast: Secondary | ICD-10-CM | POA: Diagnosis not present

## 2014-12-03 DIAGNOSIS — Z452 Encounter for adjustment and management of vascular access device: Secondary | ICD-10-CM

## 2014-12-03 DIAGNOSIS — C7971 Secondary malignant neoplasm of right adrenal gland: Secondary | ICD-10-CM

## 2014-12-03 DIAGNOSIS — C50911 Malignant neoplasm of unspecified site of right female breast: Secondary | ICD-10-CM | POA: Diagnosis not present

## 2014-12-03 MED ORDER — GADOBENATE DIMEGLUMINE 529 MG/ML IV SOLN
15.0000 mL | Freq: Once | INTRAVENOUS | Status: AC | PRN
  Administered 2014-12-03: 15 mL via INTRAVENOUS

## 2014-12-03 MED ORDER — SODIUM CHLORIDE 0.9 % IJ SOLN
10.0000 mL | Freq: Once | INTRAMUSCULAR | Status: AC
  Administered 2014-12-03: 10 mL via INTRAVENOUS

## 2014-12-03 MED ORDER — HEPARIN SOD (PORK) LOCK FLUSH 100 UNIT/ML IV SOLN
500.0000 [IU] | Freq: Once | INTRAVENOUS | Status: AC
  Administered 2014-12-03: 500 [IU] via INTRAVENOUS

## 2014-12-03 MED ORDER — SODIUM CHLORIDE 0.9 % IJ SOLN
10.0000 mL | INTRAMUSCULAR | Status: DC | PRN
  Administered 2014-12-03: 10 mL via INTRAVENOUS
  Filled 2014-12-03: qty 10

## 2014-12-03 NOTE — Progress Notes (Signed)
Patient's PAC was accessed by RN during appointment with [facility] today. Per Radiation Oncology note Patient was left accessed for appointment with [place] today. Patient returned to [facility] stating, "They couldn't remove the needle from my port at [facility]. So they told me to come here to have in removed." Appointment made for Flush Room at that time. Patient's PAC noted to be accessed with a PowerLoc needle and covered with an Opsite dressing. No redness or swelling noted to area. PAC flushed without any complaints of pain or discomfort voiced by Patient. Blood return noted with flush. PAC de-accessed without any difficulty. Patient denies any pain or discomfort. Site covered with a bandaid, and Patient discharged from the Flush Room without any complaints.

## 2014-12-03 NOTE — Patient Instructions (Signed)

## 2014-12-03 NOTE — Progress Notes (Signed)
Accessed Port-a-Cath with a 20 gauge 1 inch Y-set as requested by [place] prior to patient having a 3T MRI. Brisk blood return.  No voiced concerns.

## 2014-12-03 NOTE — Telephone Encounter (Signed)
Called patient,she is 5 minutes away from cancer center, driving on [address], patient to register and we will page her and meet her at the elevator,patient gave verbal understanding, she is to be at [facility] for MRI at 1040 am 10:09 AM

## 2014-12-04 ENCOUNTER — Ambulatory Visit: Payer: BLUE CROSS/BLUE SHIELD | Admitting: Radiation Oncology

## 2014-12-04 ENCOUNTER — Ambulatory Visit
Admission: RE | Admit: 2014-12-04 | Discharge: 2014-12-04 | Disposition: A | Discharge status: Home / Self Care | Payer: BLUE CROSS/BLUE SHIELD | Source: Ambulatory Visit | Attending: Radiation Oncology | Admitting: Radiation Oncology

## 2014-12-04 ENCOUNTER — Encounter: Payer: Self-pay | Admitting: Radiation Oncology

## 2014-12-04 VITALS — BP 120/70 | HR 86 | Temp 98.1°F | Wt 175.5 lb

## 2014-12-04 DIAGNOSIS — C7931 Secondary malignant neoplasm of brain: Secondary | ICD-10-CM

## 2014-12-04 DIAGNOSIS — C50911 Malignant neoplasm of unspecified site of right female breast: Secondary | ICD-10-CM | POA: Diagnosis not present

## 2014-12-04 MED ORDER — HEPARIN SOD (PORK) LOCK FLUSH 100 UNIT/ML IV SOLN: 500.0000 [IU] | Freq: Once | INTRAVENOUS | Status: DC

## 2014-12-04 MED ORDER — SODIUM CHLORIDE 0.9 % IJ SOLN
10.0000 mL | Freq: Once | INTRAMUSCULAR | Status: AC
  Administered 2014-12-04: 10 mL via INTRAVENOUS

## 2014-12-04 MED ORDER — HEPARIN SOD (PORK) LOCK FLUSH 100 UNIT/ML IV SOLN
500.0000 [IU] | Freq: Once | INTRAVENOUS | Status: AC
  Administered 2014-12-04: 500 [IU] via INTRAVENOUS

## 2014-12-04 MED ORDER — SODIUM CHLORIDE 0.9 % IJ SOLN: 10.0000 mL | Freq: Once | INTRAMUSCULAR | Status: DC

## 2014-12-04 NOTE — Progress Notes (Signed)
  Radiation Oncology         [phone] ________________________________  Name: Nicole Valentine MRN: [id number]  Date: 12/04/2014  DOB: 28-Dec-1965  SIMULATION AND TREATMENT PLANNING NOTE; SPECIAL TREATMENT PROCEDURE NOTE:   DIAGNOSIS:    ICD-9-CM ICD-10-CM   1. Brain metastases 198.3 C79.31 heparin lock flush 100 unit/mL     sodium chloride 0.9 % injection 10 mL     DISCONTINUED: sodium chloride 0.9 % injection 10 mL     DISCONTINUED: heparin lock flush 100 unit/mL     DISCONTINUED: sodium chloride 0.9 % injection 10 mL    NARRATIVE:  The patient was brought to the [facility].  Identity was confirmed.  All relevant records and images related to the planned course of therapy were reviewed.  The patient freely provided informed written consent to proceed with treatment after reviewing the details related to the planned course of therapy. The consent form was witnessed and verified by the simulation staff. Intravenous access was established for contrast administration. Then, the patient was set-up in a stable reproducible supine position for radiation therapy.  A relocatable thermoplastic stereotactic head frame was fabricated for precise immobilization.  CT images were obtained.  Surface markings were placed.  The CT images were loaded into the planning software and fused with the patient's targeting MRI scan.  Then the target and avoidance structures were contoured.  Treatment planning then occurred.  The radiation prescription was entered and confirmed.  I have requested 3D planning  I have requested a DVH of the following structures: Brain stem, brain, left eye, right eye, lenses, optic chiasm, target volumes, uninvolved brain, and normal tissue.    PLAN:  The patient will receive 20 Gy in 1 fraction to the 4 cerebellar metastases. Stereotactic radiosurgery will be used.  SPECIAL TREATMENT PROCEDURE NOTE:   This constitutes a special treatment procedure due to the ablative  dose that will be delivered and the technical nature of treatment.  This highly technical modality of treatment ensures that the ablative dose is centered on the patient's tumor while sparing normal tissues from excessive dose and risk of detrimental effects.   -----------------------------------  Eppie Gibson, MD

## 2014-12-04 NOTE — Progress Notes (Signed)
Radiation Oncology         [phone] ________________________________  Name: Nicole Valentine MRN: [id number]  Date: 12/04/2014  DOB: 1965-10-14  Follow-Up Visit Note  outpatient  CC: [name], MD  Consuela Mimes, MD  Diagnosis and Prior Radiotherapy:    ICD-9-CM ICD-10-CM   1. Brain metastases 198.3 C79.31    Radiation treatment dates:   01/21/2013-03/06/2013 Site/dose:        Right chest wall / 50.4 Gray @ 1.8 Pear[place] Cables per fraction x 28 fractions   Right supraclavicular fossa / 45 Gy @1 .8 Gy per fraction x 25 fractions   Right scar boost / 10 Gray at [employer] per fraction x 5 fractions  Narrative:  The patient returns today for routine follow-up. Pt presents for discussion of her 3 Tesla MRI results. This demonstrates 4 punctate cerebellar brain lesions consistent with metastasis. There are no supratentorial brain metastases. The pt is taking abraxane day 1 and day 8 of each 21 day cycle and started on 11/09/2014. The pt is also taking zolendronate and started on 11/16/2014 to repeat every 12 weeks. The pt has a port-a-cath in place.  The pt notes vision changes. Describing them as "light floaters." She saw an opthalmologist who described it as "retinal migraines." They occur infrequently and are random. She has ceased driving due to them.  ALLERGIES:  is allergic to almond meal and other.  Meds: Current Outpatient Prescriptions  Medication Sig Dispense Refill  . Ascorbic Acid (VITAMIN C PO) Take 1 tablet by mouth at bedtime.     . cholecalciferol (VITAMIN D) 1000 UNITS tablet Take 2,000 Units by mouth 2 (two) times daily.     [name] ibuprofen (ADVIL,MOTRIN) 200 MG tablet Take 400 mg by mouth every 6 (six) hours as needed for headache or moderate pain.    [name] levothyroxine (SYNTHROID, LEVOTHROID) 175 MCG tablet Take 175 mcg by mouth daily before breakfast.    . lidocaine-prilocaine (EMLA) cream Apply to affected area once 30 g 3  . loratadine (CLARITIN) 10 MG tablet Take 10 mg  by mouth daily.    [name] LORazepam (ATIVAN) 0.5 MG tablet Take 1 tablet (0.5 mg total) by mouth at bedtime as needed (Nausea or vomiting). 30 tablet 0  . pegfilgrastim (NEULASTA) 6 MG/0.6ML injection Inject 6 mg into the skin once. Patch delivery system after chemo treatments    . saccharomyces boulardii (FLORASTOR) 250 MG capsule Take 1 capsule (250 mg total) by mouth 2 (two) times daily. 60 capsule 2  . ondansetron (ZOFRAN) 8 MG tablet Take 1 tablet (8 mg total) by mouth 2 (two) times daily. Start the day after chemo for 2 days. Then take as needed for nausea or vomiting. (Patient not taking: Reported on 12/04/2014) 30 tablet 1  . prochlorperazine (COMPAZINE) 10 MG tablet Take 1 tablet (10 mg total) by mouth every 6 (six) hours as needed (Nausea or vomiting). (Patient not taking: Reported on 12/04/2014) 30 tablet 1   No current facility-administered medications for this encounter.    Physical Findings: The patient is in no acute distress. Patient is alert and oriented.  weight is 175 lb 8 oz (79.606 kg). Her temperature is 98.1 F (36.7 C). Her blood pressure is 120/70 and her pulse is 86.   EOMI. No nystagmus. Finger to nose testing and rapidly alternating movements intact. KPS = 90  100 - Normal; no complaints; no evidence of disease. 90   - Able to carry on normal activity; minor signs or symptoms of  disease. 80   - Normal activity with effort; some signs or symptoms of disease. 76   - Cares for self; unable to carry on normal activity or to do active work. 60   - Requires occasional assistance, but is able to care for most of his personal needs. 50   - Requires considerable assistance and frequent medical care. 72   - Disabled; requires special care and assistance. 60   - Severely disabled; hospital admission is indicated although death not imminent. 41   - Very sick; hospital admission necessary; active supportive treatment necessary. 10   - Moribund; fatal processes progressing  rapidly. 0     - Dead  Karnofsky DA, Abelmann [place], Craver LS and [facility] [phone]) The use of the nitrogen mustards in the palliative treatment of carcinoma: with particular reference to bronchogenic carcinoma Cancer 1 [id number]   Lab Findings: Lab Results  Component Value Date   WBC 5.5 11/30/2014   HGB 12.2 11/30/2014   HCT 36.4 11/30/2014   MCV 101.7* 11/30/2014   PLT 204 11/30/2014    Radiographic Findings: Dg Chest 2 View  11/18/2014   CLINICAL DATA:  Right breast cancer. Recent right thoracentesis. Fever and shortness of breath.  EXAM: CHEST  2 VIEW  COMPARISON:  11/17/2014  FINDINGS: Improved aeration at the right lung base compatible with recent thoracentesis. Right basilar densities are compatible with atelectasis and residual pleural fluid. Negative for pneumothorax. There is a left jugular Port-A-Cath. The catheter tip is in the lower SVC region. Left lung is clear. Heart size is normal.  IMPRESSION: Improved aeration in the right lung compatible with recent thoracentesis. Right basilar densities are compatible with atelectasis or residual pleural fluid. Negative for pneumothorax.  Port-A-Cath as described.   Electronically Signed   By: Markus Daft M.D.   On: 11/18/2014 14:48   Mr Jeri Cos PE Contrast  12/03/2014   CLINICAL DATA:  49 year old female with breast 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 Targeting for stereotactic radio surgery requested. Subsequent encounter.  EXAM: MRI HEAD WITHOUT AND WITH CONTRAST  TECHNIQUE: Multiplanar, multiecho pulse sequences of the brain and surrounding structures were obtained without and with intravenous contrast.  CONTRAST:  44mL MULTIHANCE GADOBENATE DIMEGLUMINE 529 MG/ML IV SOLN  COMPARISON:  11/16/2014 brain MRI, and earlier  FINDINGS: Both the small 3-4 mm medial right cerebellar tonsil metastasis an the more central right cerebellar hemisphere metastasis have mildly decreased in size and conspicuity since 11/16/2014. These are annotated on series  10.  I also suspect a subtle punctate right cerebellar hemisphere metastasis near the previously described lesion (series 10, image 52). Wrap artifact along the anterior aspect of the left cerebellum (e.g. Image 53).  2-3 mm small midline superior cerebellar metastasis on image 60.  No brainstem or supratentorial brain metastasis is identified no dural thickening identified. Incidental small anterior right frontal lobe developmental venous anomaly (series 10, image 89).  No cerebellar edema or mass effect. No restricted diffusion or evidence of acute infarction. No ventriculomegaly. Normal basilar cisterns. No acute intracranial hemorrhage identified. Normal for age gray and white matter signal. Negative pituitary and cervicomedullary junction. Grossly negative visualized cervical spine. Orbits soft tissues appear normal. Negative scalp soft tissues. Negative paranasal sinuses. Major intracranial vascular flow voids are within normal limits.  Cystic structure or fluid at the right petrous apex (series 6, image 8) has mildly heterogeneous intrinsic T1 signal and is not definitely enhancing. No associated diffusion abnormality. Burtis Junes this is benign and postinflammatory.  Decreased  T1 bone marrow signal throughout the visible skeleton, nonspecific. No destructive osseous lesion identified.  IMPRESSION: 1. Four small (3-4 mm max) cerebellar metastases. The 2 identified in June appear slightly regressed. No associated cerebellar edema or mass effect. 2. Decreased T1 bone marrow signal is nonspecific, without strong evidence of osseous metastatic disease at this time.   Electronically Signed   By: Genevie Ann M.D.   On: 12/03/2014 12:52   Mr Jeri Cos JY Contrast  11/16/2014   CLINICAL DATA:  Breast cancer. Staging. No reported neurologic symptoms.  EXAM: MRI HEAD WITHOUT AND WITH CONTRAST  TECHNIQUE: Multiplanar, multiecho pulse sequences of the brain and surrounding structures were obtained without and with intravenous  contrast.  CONTRAST:  MultiHance 17 mL.  COMPARISON:  CT head 10/28/2014.  FINDINGS: No acute stroke, acute hemorrhage, hydrocephalus, or extra-axial fluid. Normal cerebral volume. No significant white matter disease.  No definite osseous metastatic disease. Upper cervical region unremarkable. Trapped fluid in the RIGHT petrous apex, non worrisome.  Post infusion, two metastatic lesions are identified. The larger lesion is in the RIGHT cerebellar tonsil, 4 mm diameter, adjacent to the foramen of Luschka (image 13 series 11). A smaller lesion is seen in the RIGHT lateral cerebellum, 3 mm diameter, (image 19 series 11), adjacent to a superiorly draining cerebellar developmental venous anomaly. No mass effect or significant surrounding edema. No other definite intracranial metastatic lesions are seen. Incidental additional second DVA RIGHT medial frontal cortex, seen on image 41 series 11.  Major dural venous sinuses are patent. Negative orbits. No sinus air-fluid level. 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 No significant edema or mass effect from either.  Findings discussed with ordering provider.   Electronically Signed   By: Staci Righter M.D.   On: 11/16/2014 09:08   Nm Pet Image Restag (ps) Skull Base To Thigh  11/06/2014   CLINICAL DATA:  Subsequent treatment strategy for breast cancer. Restaging examination. History of right-sided breast cancer status post right-sided modified radical mastectomy 11/18/2012.  EXAM: NUCLEAR MEDICINE PET SKULL BASE TO THIGH  TECHNIQUE: 8.8 mCi F-18 FDG was injected intravenously. Full-ring PET imaging was performed from the skull base to thigh after the radiotracer. CT data was obtained and used for attenuation correction and anatomic localization.  FASTING BLOOD GLUCOSE:  Value: 107 mg/dl  COMPARISON:  Chest CT 04/20/2014.  PET-CT  04/19/2012.  FINDINGS: NECK  Multiple borderline and mildly enlarged low cervical and supraclavicular lymph nodes which are markedly hypermetabolic bilaterally (right greater than left).  CHEST  Innumerable borderline enlarged and mildly enlarged hypermetabolic mediastinal and bilateral hilar lymph nodes are hypermetabolic. The right hilar nodal tissue is contiguous with parenchymal mass-like opacities involving the right lower lobe and to a lesser extent the right middle lobe. There are several air bronchograms throughout this region, however, given the general mass-like appearance of this area and the associated profound septal thickening which extends into the surrounding lung parenchyma and is somewhat nodular in appearance, this entire area is favored to be predominantly related to lymphangitic spread of tumor (in addition to some associated postobstructive changes), and this region is diffusely hypermetabolic (SUVmax = 78.2). Associated with this is a moderate right-sided pleural effusion, and there is some associated pleural hypermetabolism in the base of the right hemithorax (SUVmax = 10.0) indicative of a malignant pleural effusion. Multiple pulmonary nodules are noted in the lungs bilaterally,  which demonstrate hypermetabolism, compatible with additional metastatic foci.  ABDOMEN/PELVIS  Several areas of hypermetabolism are noted in the liver, but are not readily detectable on the CT images. The largest focus of hypermetabolism is in the superior aspect of segment 8 (SUVmax = 6.3). Gallbladder, pancreas, spleen, left adrenal gland and bilateral kidneys are unremarkable in appearance. There is a small focus of hypermetabolism in the right adrenal gland (SUVmax = 10.3). In addition, there are several borderline enlarged upper abdominal lymph nodes, most evident in the celiac axis which are hypermetabolic (SUVmax = 7.6-7.2). Mild atherosclerosis throughout the abdominal and pelvic vasculature. No significant  volume of ascites. No pneumoperitoneum. No pathologic distention of small bowel or colon. Uterus and ovaries are unremarkable in appearance. Status post right modified radical mastectomy and axillary lymph node dissection.  SKELETON  Multifocal skeletal hypermetabolism, compatible with widespread metastatic disease to the bones. Some of the larger lesions include diffuse hypermetabolism and subtle sclerosis in T9 vertebral body (SUVmax = 15.4), a subtly sclerotic hypermetabolic lesion in the inferior aspect of the sternum (SUVmax = 13.4), multiple rib lesions, including the posterior aspect of the left seventh rib, small hypermetabolic focus in the periphery of the left humeral head, and multiple sclerotic lesions in the ileum bilaterally, largest of which is on the right measuring approximately 1.9 cm (SUVmax = 19.0).  IMPRESSION: 1. Today's study demonstrates widespread metastatic disease throughout the neck, chest, abdomen and pelvis, with widespread pulmonary metastases, including a large volume of lymphangitic spread of disease in the right middle and lower lobes, a malignant right pleural effusion, multiple hepatic lesions, a right adrenal metastasis, numerous osseous lesions, and widespread lymphadenopathy, as detailed above. These results were discussed by telephone at the time of interpretation on 11/06/2014 at 3:10 pm to Dr. Lurline Del, who verbally acknowledged these results.   Electronically Signed   By: Vinnie Langton M.D.   On: 11/06/2014 15:29   US Biopsy  11/06/2014   CLINICAL DATA:  49 year old female with a history of breast carcinoma, now with possible recurrence. She has been referred for percutaneous biopsy of cervical lymph node.  EXAM: ULTRASOUND GUIDED CORE BIOPSY OF CERVICAL LYMPH NODE  MEDICATIONS: 1.5 mg IV Versed; 75 mcg IV Fentanyl  Total Moderate Sedation Time: 14  PROCEDURE: The procedure, risks, benefits, and alternatives were explained to the patient. Questions regarding the  procedure were encouraged and answered. The patient understands and consents to the procedure.  Ultrasound survey was performed with images stored and sent to PACs.  The right neck was prepped with Betadine in a sterile fashion, and a sterile drape was applied covering the operative field. A sterile gown and sterile gloves were used for the procedure. Local anesthesia was provided with 1% Lidocaine.  Ultrasound guidance was then used to infiltrate the skin and subcutaneous tissues with 1% lidocaine, and then a small stab incision was made at the base of the right neck with an 11 blade scalpel. A biopsy gun (18 gauge) was then advanced into the pathologic note at the base of the right neck, with 3 subsequent core biopsy achieved under ultrasound guidance.  The patient tolerated the procedure well and remained hemodynamically stable throughout.  No complications were encountered and no significant blood loss was encountered.  Small bandage was placed after the procedure.  Samples were placed into formalin for transportation to the lab.  Final image was stored after biopsy.  Patient tolerated the procedure well and remained hemodynamically stable throughout.  COMPLICATIONS: None.  FINDINGS:  Ultrasound survey demonstrates multiple abnormal appearing lymph nodes/nodules within the right supraclavicular region and cervical region.  Images during the case demonstrate placement of biopsy gun into right cervical/supraclavicular lymph node.  Final image demonstrates no complicating features.  IMPRESSION: Status post ultrasound-guided biopsy of right-sided cervical/ supraclavicular lymph nodes, with tissue specimen sent to pathology for complete histopathologic analysis.  Signed,  Dulcy Fanny. Earleen Newport, DO  [facility]  [facility]   Electronically Signed   By: Corrie Mckusick D.O.   On: 11/06/2014 14:23   Ir Fluoro Guide Cv Line Left  11/12/2014   CLINICAL DATA:  49 year old with  metastatic breast cancer.  EXAM: FLUOROSCOPIC AND ULTRASOUND GUIDED PLACEMENT OF A SUBCUTANEOUS PORT.  Physician: Stephan Minister. Henn, MD  FLUOROSCOPY TIME:  2 minutes and 18 seconds.  18 mGy  MEDICATIONS AND MEDICAL HISTORY: Versed 4.0 mg, Fentanyl 50 mcg. A radiology nurse monitored the patient for moderate sedation.  As antibiotic prophylaxis, Ancef 2 gm was ordered pre-procedure and administered intravenously within one hour of incision.  ANESTHESIA/SEDATION: Moderate sedation time: 47 minutes  PROCEDURE: The risks of the procedure were explained to the patient. Informed consent was obtained. Patient was placed supine on the interventional table. Ultrasound confirmed a patent left internal jugular vein. The left chest and neck were cleaned with a skin antiseptic and a sterile drape was placed. Maximal barrier sterile technique was utilized including caps, mask, sterile gowns, sterile gloves, sterile drape, hand hygiene and skin antiseptic. The left neck was anesthetized with 1% lidocaine. Small incision was made in the left neck with a blade. Micropuncture set was placed in the left IJ with ultrasound guidance. The left chest was anesthetized with 1% lidocaine with epinephrine. #15 blade was used to make an incision and a subcutaneous port pocket was formed. [place] was selected. Subcutaneous tunnel was formed with a stiff tunneling device. The port catheter was brought through the subcutaneous tunnel. The micropuncture set was exchanged for a peel-away sheath. The catheter was placed through the peel-away sheath and the tip was positioned at the superior cavoatrial junction. Catheter was attached to the port and placed within the subcutaneous pocket. Catheter placement was confirmed with fluoroscopy. The port was accessed and flushed with heparinized saline. The port pocket was closed using two layers of absorbable sutures and Dermabond. The vein skin site was closed using a single layer of absorbable  suture and Dermabond. Sterile dressings were applied. Patient tolerated the procedure well without an immediate complication. Ultrasound and fluoroscopic images were taken and saved for this procedure.  Estimated blood loss: Minimal  COMPLICATIONS: None  IMPRESSION: Placement of a subcutaneous port device. Catheter tip at the superior cavoatrial junction and ready to be used.   Electronically Signed   By: Markus Daft M.D.   On: 11/12/2014 16:00   Ir US Guide Vasc Access Left  11/12/2014   CLINICAL DATA:  49 year old with metastatic breast cancer.  EXAM: FLUOROSCOPIC AND ULTRASOUND GUIDED PLACEMENT OF A SUBCUTANEOUS PORT.  Physician: Stephan Minister. Henn, MD  FLUOROSCOPY TIME:  2 minutes and 18 seconds.  18 mGy  MEDICATIONS AND MEDICAL HISTORY: Versed 4.0 mg, Fentanyl 50 mcg. A radiology nurse monitored the patient for moderate sedation.  As antibiotic prophylaxis, Ancef 2 gm was ordered pre-procedure and administered intravenously within one hour of incision.  ANESTHESIA/SEDATION: Moderate sedation time: 47 minutes  PROCEDURE: The risks of the procedure were explained to the patient. Informed consent was obtained. Patient was placed supine  on the interventional table. Ultrasound confirmed a patent left internal jugular vein. The left chest and neck were cleaned with a skin antiseptic and a sterile drape was placed. Maximal barrier sterile technique was utilized including caps, mask, sterile gowns, sterile gloves, sterile drape, hand hygiene and skin antiseptic. The left neck was anesthetized with 1% lidocaine. Small incision was made in the left neck with a blade. Micropuncture set was placed in the left IJ with ultrasound guidance. The left chest was anesthetized with 1% lidocaine with epinephrine. #15 blade was used to make an incision and a subcutaneous port pocket was formed. [place] was selected. Subcutaneous tunnel was formed with a stiff tunneling device. The port catheter was brought through the  subcutaneous tunnel. The micropuncture set was exchanged for a peel-away sheath. The catheter was placed through the peel-away sheath and the tip was positioned at the superior cavoatrial junction. Catheter was attached to the port and placed within the subcutaneous pocket. Catheter placement was confirmed with fluoroscopy. The port was accessed and flushed with heparinized saline. The port pocket was closed using two layers of absorbable sutures and Dermabond. The vein skin site was closed using a single layer of absorbable suture and Dermabond. Sterile dressings were applied. Patient tolerated the procedure well without an immediate complication. Ultrasound and fluoroscopic images were taken and saved for this procedure.  Estimated blood loss: Minimal  COMPLICATIONS: None  IMPRESSION: Placement of a subcutaneous port device. Catheter tip at the superior cavoatrial junction and ready to be used.   Electronically Signed   By: Markus Daft M.D.   On: 11/12/2014 16:00   Dg Chest Portable 1 View  11/17/2014   CLINICAL DATA:  Fever. Cough and shortness of breath for 2 weeks. History of metastatic breast cancer.  EXAM: PORTABLE CHEST - 1 VIEW  COMPARISON:  PET-CT 11/06/2014, chest radiographs 10/29/2014  FINDINGS: Tip of the accessed left chest port in the mid SVC. Moderate-large right pleural effusion occupying the lower 1/2 of right hemithorax. No pneumothorax. There is adjacent opacity at the right lung base, likely combination of atelectasis and known metastatic disease. No consolidation in the aerated right upper or left lung. The heart size and mediastinal contours are normal for technique.  IMPRESSION: Moderate to large right pleural effusion occupying the lower 1/2 of right hemithorax. Upper right lung and left lung are clear.   Electronically Signed   By: Jeb Levering M.D.   On: 11/17/2014 22:43   Ir Thoracentesis Asp Pleural Space W/img Guide  11/18/2014   INDICATION: 49 year old female with a history  of breast carcinoma, right-sided. She has been referred for a ultrasound-guided paracentesis.  EXAM: IR THORACENTESIS ASP PLEURAL SPACE W/IMG GUIDE  COMPARISON:  11/12/2014, 11/06/2014, 10/29/2014  MEDICATIONS: The patient is currently admitted to the hospital and receiving intravenous antibiotics. The antibiotics were administered within an appropriate time frame prior to the initiation of the procedure.  ANESTHESIA/SEDATION: None  CONTRAST:  None  COMPLICATIONS: None  PROCEDURE: Informed written consent was obtained from the patient after a discussion of the risks, benefits and alternatives to treatment. The patient was seated on a gantry in the fluoroscopy suite, and ultrasound survey of the posterior right thorax was performed with images stored and sent to PACs.  The patient was then prepped and draped in the usual sterile fashion using Betadine prep.  Once the patient was prepped and draped sterilely, the skin and subcutaneous tissues were generously infiltrated with 1% lidocaine for local anesthesia the  level of the pleura.  A Yueh needle was advanced into the pleural space with confirmation by aspiration of pleural fluid. The UE catheter was advanced into the space in needle was removed.  Approximately 1100 cc of thin yellow fluid were aspirated. A sample was sent to the lab for pre ordered labs.  Catheter was removed at the completion, and the patient tolerated the procedure well.  No complications were encountered and no significant blood loss was encountered.  IMPRESSION: Status post ultrasound-guided right-sided thoracentesis, with sample sent the lab for analysis. Approximately 1100 cc of thin yellow fluid was aspirated.  Signed,  Dulcy Fanny. Earleen Newport, DO  [facility]  [facility]   Electronically Signed   By: Corrie Mckusick D.O.   On: 11/18/2014 13:29    Impression/Plan: Patient presents with four small cerebellar metastases. We disucssed the option of whole  brain radiotherapy vs. external beam radiotherapy to the posterior fossa vs. stereotactic radiotherapy. We talked about the pros and cons of all options. We discussed that chemotherapy is standardly held during external beam radiotherapy but not SRS. Ultimately we came to an agreement to move forward with SRS. She will proceed with sim today and anticipate treatment next week. Consent form signed today. She is enthusiastic to proceed.    This document serves as a record of services personally performed by Eppie Gibson, MD. It was created on her behalf by Darcus Austin, a trained medical scribe. The creation of this record is based on the scribe's personal observations and the provider's statements to them. This document has been checked and approved by the attending provider.     _____________________________________   Eppie Gibson, MD

## 2014-12-04 NOTE — Addendum Note (Signed)
Encounter addended by: Benn Moulder, RN on: 12/04/2014 10:12 AM<BR>     Documentation filed: Lines[employer]Drains[employer]Air[employer] Properties Editor, Notes Section

## 2014-12-04 NOTE — Progress Notes (Signed)
Port-a-cath accessed with a 20 gauge 1 inch needle in  Left chest.  Noted reddened area on the inner left chest region adjacent to her port site where opsite was applied on yesterday prior to an MRI completed at [facility].  She denis ay itching or pain in this area.  Prior to securing the site for access today, Skin protective was applied, dried, then the entire site was covered with Opsite flexigrid.  Denied any discomfort.  BUN 9.7 and Creatinine 0.6 as of 11/30/14.  No allergies to IVP Dye.  No diabetes.  VSS

## 2014-12-04 NOTE — Addendum Note (Signed)
Encounter addended by: Benn Moulder, RN on: 12/04/2014 10:16 AM<BR>     Documentation filed: Orders, Dx Association, Demographics Visit

## 2014-12-04 NOTE — Addendum Note (Signed)
Encounter addended by: Benn Moulder, RN on: 12/04/2014 11:20 AM<BR>     Documentation filed: Notes Section, Lines/Drains/Airways Education officer, museum

## 2014-12-04 NOTE — Progress Notes (Signed)
Deaccessed Port left chest,. Brisk blood return.  Flushed Saline by therapist post Simulation and flushed with Heparin per protocol by this RN.  Site without any irritation.  Covered with bandaid.  No voiced concerns by Mrs. Benard.

## 2014-12-07 ENCOUNTER — Ambulatory Visit: Payer: BLUE CROSS/BLUE SHIELD

## 2014-12-07 ENCOUNTER — Encounter: Payer: Self-pay | Admitting: Nurse Practitioner

## 2014-12-07 ENCOUNTER — Other Ambulatory Visit (HOSPITAL_BASED_OUTPATIENT_CLINIC_OR_DEPARTMENT_OTHER): Payer: BLUE CROSS/BLUE SHIELD

## 2014-12-07 ENCOUNTER — Ambulatory Visit (HOSPITAL_BASED_OUTPATIENT_CLINIC_OR_DEPARTMENT_OTHER): Payer: BLUE CROSS/BLUE SHIELD | Admitting: Nurse Practitioner

## 2014-12-07 ENCOUNTER — Ambulatory Visit (HOSPITAL_BASED_OUTPATIENT_CLINIC_OR_DEPARTMENT_OTHER): Payer: BLUE CROSS/BLUE SHIELD

## 2014-12-07 VITALS — BP 126/75 | HR 87 | Temp 98.2°F | Resp 18 | Ht 67.0 in | Wt 176.2 lb

## 2014-12-07 DIAGNOSIS — C7802 Secondary malignant neoplasm of left lung: Secondary | ICD-10-CM

## 2014-12-07 DIAGNOSIS — C50411 Malignant neoplasm of upper-outer quadrant of right female breast: Secondary | ICD-10-CM

## 2014-12-07 DIAGNOSIS — C7801 Secondary malignant neoplasm of right lung: Secondary | ICD-10-CM

## 2014-12-07 DIAGNOSIS — C7971 Secondary malignant neoplasm of right adrenal gland: Secondary | ICD-10-CM

## 2014-12-07 DIAGNOSIS — C7951 Secondary malignant neoplasm of bone: Secondary | ICD-10-CM

## 2014-12-07 DIAGNOSIS — C787 Secondary malignant neoplasm of liver and intrahepatic bile duct: Secondary | ICD-10-CM

## 2014-12-07 DIAGNOSIS — C778 Secondary and unspecified malignant neoplasm of lymph nodes of multiple regions: Secondary | ICD-10-CM

## 2014-12-07 DIAGNOSIS — C50919 Malignant neoplasm of unspecified site of unspecified female breast: Secondary | ICD-10-CM

## 2014-12-07 DIAGNOSIS — Z5111 Encounter for antineoplastic chemotherapy: Secondary | ICD-10-CM

## 2014-12-07 DIAGNOSIS — Z171 Estrogen receptor negative status [ER-]: Secondary | ICD-10-CM

## 2014-12-07 DIAGNOSIS — Z95828 Presence of other vascular implants and grafts: Secondary | ICD-10-CM

## 2014-12-07 LAB — CBC WITH DIFFERENTIAL/PLATELET
BASO%: 0.9 % (ref 0.0–2.0)
Basophils Absolute: 0 10*3/uL (ref 0.0–0.1)
EOS%: 1.5 % (ref 0.0–7.0)
Eosinophils Absolute: 0 10*3/uL (ref 0.0–0.5)
HEMATOCRIT: 35.2 % (ref 34.8–46.6)
HEMOGLOBIN: 11.8 g/dL (ref 11.6–15.9)
LYMPH#: 0.6 10*3/uL — AB (ref 0.9–3.3)
LYMPH%: 20.5 % (ref 14.0–49.7)
MCH: 34.4 pg — ABNORMAL HIGH (ref 25.1–34.0)
MCHC: 33.6 g/dL (ref 31.5–36.0)
MCV: 102.3 fL — AB (ref 79.5–101.0)
MONO#: 0.2 10*3/uL (ref 0.1–0.9)
MONO%: 5.6 % (ref 0.0–14.0)
NEUT%: 71.5 % (ref 38.4–76.8)
NEUTROS ABS: 2.1 10*3/uL (ref 1.5–6.5)
PLATELETS: 214 10*3/uL (ref 145–400)
RBC: 3.44 10*6/uL — ABNORMAL LOW (ref 3.70–5.45)
RDW: 14 % (ref 11.2–14.5)
WBC: 2.9 10*3/uL — ABNORMAL LOW (ref 3.9–10.3)

## 2014-12-07 LAB — COMPREHENSIVE METABOLIC PANEL (CC13)
ALT: 14 U/L (ref 0–55)
AST: 17 U/L (ref 5–34)
Albumin: 3.4 g/dL — ABNORMAL LOW (ref 3.5–5.0)
Alkaline Phosphatase: 79 U/L (ref 40–150)
Anion Gap: 6 mEq/L (ref 3–11)
BUN: 11.2 mg/dL (ref 7.0–26.0)
CHLORIDE: 107 meq/L (ref 98–109)
CO2: 28 meq/L (ref 22–29)
Calcium: 9.1 mg/dL (ref 8.4–10.4)
Creatinine: 0.7 mg/dL (ref 0.6–1.1)
EGFR: >90 mL/min/{1.73_m2} (ref >90)
Glucose: 99 mg/dl (ref 70–140)
Potassium: 3.8 mEq/L (ref 3.5–5.1)
Sodium: 141 mEq/L (ref 136–145)
Total Bilirubin: 0.59 mg/dL (ref 0.20–1.20)
Total Protein: 6 g/dL — ABNORMAL LOW (ref 6.4–8.3)

## 2014-12-07 MED ORDER — SODIUM CHLORIDE 0.9 % IJ SOLN
10.0000 mL | INTRAMUSCULAR | Status: DC | PRN
  Administered 2014-12-07: 10 mL
  Filled 2014-12-07: qty 10

## 2014-12-07 MED ORDER — PACLITAXEL PROTEIN-BOUND CHEMO INJECTION 100 MG
100.0000 mg/m2 | Freq: Once | INTRAVENOUS | Status: AC
  Administered 2014-12-07: 200 mg via INTRAVENOUS
  Filled 2014-12-07: qty 40

## 2014-12-07 MED ORDER — HEPARIN SOD (PORK) LOCK FLUSH 100 UNIT/ML IV SOLN
500.0000 [IU] | Freq: Once | INTRAVENOUS | Status: AC | PRN
  Administered 2014-12-07: 500 [IU]
  Filled 2014-12-07: qty 5

## 2014-12-07 MED ORDER — SODIUM CHLORIDE 0.9 % IJ SOLN
10.0000 mL | INTRAMUSCULAR | Status: DC | PRN
  Administered 2014-12-07: 10 mL via INTRAVENOUS
  Filled 2014-12-07: qty 10

## 2014-12-07 MED ORDER — HEPARIN SOD (PORK) LOCK FLUSH 100 UNIT/ML IV SOLN
500.0000 [IU] | Freq: Once | INTRAVENOUS | Status: DC
Start: 2014-12-07 — End: 2014-12-07
  Filled 2014-12-07: qty 5

## 2014-12-07 MED ORDER — PEGFILGRASTIM 6 MG/0.6ML ~~LOC~~ PSKT
6.0000 mg | PREFILLED_SYRINGE | Freq: Once | SUBCUTANEOUS | Status: AC
  Administered 2014-12-07: 6 mg via SUBCUTANEOUS
  Filled 2014-12-07: qty 0.6

## 2014-12-07 MED ORDER — DEXAMETHASONE SODIUM PHOSPHATE 100 MG/10ML IJ SOLN
Freq: Once | INTRAMUSCULAR | Status: AC
  Administered 2014-12-07: 13:00:00 via INTRAVENOUS
  Filled 2014-12-07: qty 4

## 2014-12-07 MED ORDER — SODIUM CHLORIDE 0.9 % IV SOLN
Freq: Once | INTRAVENOUS | Status: AC
  Administered 2014-12-07: 13:00:00 via INTRAVENOUS

## 2014-12-07 NOTE — Patient Instructions (Signed)
[facility] Discharge Instructions for Patients Receiving Chemotherapy  Today you received the following chemotherapy agents: Abraxane   To help prevent nausea and vomiting after your treatment, we encourage you to take your nausea medication as directed.    If you develop nausea and vomiting that is not controlled by your nausea medication, call the clinic.   BELOW ARE SYMPTOMS THAT SHOULD BE REPORTED IMMEDIATELY:  *FEVER GREATER THAN 100.5 F  *CHILLS WITH OR WITHOUT FEVER  NAUSEA AND VOMITING THAT IS NOT CONTROLLED WITH YOUR NAUSEA MEDICATION  *UNUSUAL SHORTNESS OF BREATH  *UNUSUAL BRUISING OR BLEEDING  TENDERNESS IN MOUTH AND THROAT WITH OR WITHOUT PRESENCE OF ULCERS  *URINARY PROBLEMS  *BOWEL PROBLEMS  UNUSUAL RASH Items with * indicate a potential emergency and should be followed up as soon as possible.  Feel free to call the clinic you have any questions or concerns. The clinic phone number is [phone].  Please show the CHEMO ALERT CARD at check-in to the Emergency Department and triage nurse.   

## 2014-12-07 NOTE — Patient Instructions (Signed)

## 2014-12-07 NOTE — Progress Notes (Signed)
And a Patient ID: Nicole Valentine, female   DOB: 20-Oct-1965, 49 y.o.   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 She had a mammogram performed that showed dense breasts bilaterally. Ultrasound of the right breast showed 2.3 x 1.9 cm area of abnormality with multiple abnormal lymph nodes. MRI of the bilateral breasts showed asymmetrical enhancement throughout the right breast consistent with multicentric disease. An ultrasound-guided biopsy performed. The known area of disease measured 1.9 x 1.9 x 2.3 cm. Multiple abnormal positive right axillary lymph nodes were noted. 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 Biopsy of the right axillary lymph node was positive for malignancy with extracapsular extension. Patient was originally seen by Dr. Margot Valentine Dr. Truddie Valentine and Dr. Pablo Valentine. She has elected to have a right mastectomy eventually and declined biopsies of any other areas within the breast."  She went on to receive neoadjuvant chemotherapy and attained a complete pathologic response, as documented below  Nicole Valentine noted a very small right supraclavicular mass 09/25/2014, which she brought to our attention. She was having some allergy symptoms at the time so we decided to reevaluate this after a few weeks and on 10/22/2014 as the mass persisted we obtained a restaging neck CT scan, 10/28/2014. There was bilateral supraclavicular adenopathy, but also a large right pleural effusion was incidentally noted. We proceeded to right thoracentesis 10/29/2014, and a liter of  hazy yellow fluid was removed. Cytology from this procedure ([id number]) showed malignant cells consistent with adenocarcinoma, estrogen and progesterone receptor negative, HER-2 not amplified, with a signals ratio 1.12 and the number per cell being 2.75, and with an MIB-1 of 80%. I called Nicole Valentine with these results and set her up for a PET scan performed 11/06/2014, which shows widespread metastatic disease involving particularly the right lung, liver, and bones, but also the left lung, right adrenal, and multiple lymph node areas.  Her subsequent history is as detailed below.  INTERVAL HISTORY: Nicole Valentine returns today for follow-up of her metastatic breast cancer. Today is day 8 cycle 2 of 8 planned cycles of Abraxane, given days 1 and 8 of each 21 day cycle, with onpro support.  REVIEW OF S/YSTEMS: Nicole Valentine continues to manage treatment well. She denies fevers, chills, nausea, vomiting, or changes in bladder habits. She takes florastor daily and is moving her bowels well. Her appetite is down some. She forgot to wear her compression sleeve last night and had a bit of swelling to her right arm, so she is wearing it today. She denies mouth sores, rashes, or neuropathy symptoms. Her lower back is a little sore because she has been moving furniture. She denies headaches, dizziness, or vision changes. Her fatigue has improved some. A detailed review of systems is otherwise stable.  PAST MEDICAL HISTORY: Past Medical History  Diagnosis Date  . Complication of anesthesia     her father and uncle both had very difficult time waking up-was  something they told them may be hereditory. she will find out.  [name] Hypothyroidism   . 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 Radiation 01/21/13-03/06/13  Right Breast  . Breast cancer dx'd 04-10-12-rt    PAST SURGICAL HISTORY: Past Surgical History  Procedure Laterality Date  . Wisdom tooth extraction    . Portacath placement  04/22/2012    Procedure: INSERTION  PORT-A-CATH;  Surgeon: Nicole Lasso, MD;  Location: [facility];  Service: General;  Laterality: Left;  [name] Modified mastectomy Right 11/18/2012    Procedure:  RIGHT MODIFIED MASTECTOMY;  Surgeon: Nicole Lasso, MD;  Location: [facility];  Service: General;  Laterality: Right;  . Knee arthroscopy Right 11/26/2013  . Port-a-cath removal Left 12/10/2013    Procedure: MINOR REMOVAL PORT-A-CATH;  Surgeon: [name], MD;  Location: [facility];  Service: General;  Laterality: Left;    FAMILY HISTORY Family History  Problem Relation Age of Onset  . Lung cancer Maternal Grandfather 108    smoker  . Prostate cancer Maternal Grandfather 90  . Throat cancer Other     Great Aunt x 2  . Liver cancer Other     Maternal Great Grandmother  . Melanoma Maternal Uncle 81  . Brain cancer Cousin 11    non-malignant  the patient's parents are living, in their early 58s. The patient has 2 brothers, no sisters. The patient's mother was diagnosed with breast cancer, HER-2 positive, in 2014 in [place].  GYNECOLOGIC HISTORY:   (Updated 10/03/2013) Menarche age 23, first live birth age 67. She is GX P4. LMP January 2014. 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  The children are currently ages 65, 53, 17, and 23. Her husband, Nicole Valentine, is a Customer service manager. They attend the [employer] DIRECTIVES: The patient's husband is her HCPOA   HEALTH MAINTENANCE:  (Updated 10/03/2013) History  Substance Use Topics  . Smoking status: Never Smoker   . Smokeless tobacco: Never Used  . Alcohol Use: No     Colonoscopy: Never  PAP: November 2013/Dr. Smith  Bone density: January 2015, [facility], normal  Lipid panel:  Not on file  Allergies  Allergen Reactions  . Almond Meal Rash  . Other Rash    LOTIONS    Current Outpatient Prescriptions  Medication Sig  Dispense Refill  . Ascorbic Acid (VITAMIN C PO) Take 1 tablet by mouth at bedtime.     . cholecalciferol (VITAMIN D) 1000 UNITS tablet Take 2,000 Units by mouth 2 (two) times daily.     [name] levothyroxine (SYNTHROID, LEVOTHROID) 175 MCG tablet Take 175 mcg by mouth daily before breakfast.    . lidocaine-prilocaine (EMLA) cream Apply to affected area once 30 g 3  . loratadine (CLARITIN) 10 MG tablet Take 10 mg by mouth daily.    . pegfilgrastim (NEULASTA) 6 MG/0.6ML injection Inject 6 mg into the skin once. Patch delivery system after chemo treatments    . saccharomyces boulardii (FLORASTOR) 250 MG capsule Take 1 capsule (250 mg total) by mouth 2 (two) times daily. (Patient taking differently: Take 250 mg by mouth 2 (two) times daily. Pt takes 2 capsules 250 mg each  2 times daily) 60 capsule 2  . ibuprofen (ADVIL,MOTRIN) 200 MG tablet Take 400 mg by mouth every 6 (six) hours as needed for headache or moderate pain.    [name] LORazepam (ATIVAN) 0.5 MG tablet Take 1 tablet (0.5 mg total) by mouth at bedtime as needed (Nausea or vomiting). (Patient not taking: Reported on 12/07/2014)  30 tablet 0  . ondansetron (ZOFRAN) 8 MG tablet Take 1 tablet (8 mg total) by mouth 2 (two) times daily. Start the day after chemo for 2 days. Then take as needed for nausea or vomiting. (Patient not taking: Reported on 12/04/2014) 30 tablet 1  . prochlorperazine (COMPAZINE) 10 MG tablet Take 1 tablet (10 mg total) by mouth every 6 (six) hours as needed (Nausea or vomiting). (Patient not taking: Reported on 12/04/2014) 30 tablet 1   No current facility-administered medications for this visit.      OBJECTIVE: Middle-aged white woman in no acute distress Filed Vitals:   12/07/14 1148  BP: 126/75  Pulse: 87  Temp: 98.2 F (36.8 C)  Resp: 18     Body mass index is 27.59 kg/(m^2).    ECOG FS:1 - Symptomatic but completely ambulatory Filed Weights   12/07/14 1148  Weight: 176 lb 3.2 oz (79.924 kg)   Skin: warm, dry  HEENT:  sclerae anicteric, conjunctivae pink, oropharynx clear. No thrush or mucositis.  Lymph Nodes: No cervical or supraclavicular lymphadenopathy  Lungs: clear to auscultation bilaterally, no rales, wheezes, or rhonci  Heart: regular rate and rhythm  Abdomen: round, soft, non tender, positive bowel sounds  Musculoskeletal: No focal spinal tenderness, right upper arm grade 1 lymphedema in sleeve Neuro: non focal, well oriented, positive affect  Breast: deferred   LAB RESULTS:  Lab Results  Component Value Date   WBC 2.9* 12/07/2014   NEUTROABS 2.1 12/07/2014   HGB 11.8 12/07/2014   HCT 35.2 12/07/2014   MCV 102.3* 12/07/2014   PLT 214 12/07/2014      Chemistry      Component Value Date/Time   NA 141 12/07/2014 1128   NA 138 11/19/2014 0510   K 3.8 12/07/2014 1128   K 3.2* 11/19/2014 0510   CL 104 11/19/2014 0510   CL 101 10/18/2012 0859   CO2 28 12/07/2014 1128   CO2 27 11/19/2014 0510   BUN 11.2 12/07/2014 1128   BUN 7 11/19/2014 0510   CREATININE 0.7 12/07/2014 1128   CREATININE 0.59 11/19/2014 0510      Component Value Date/Time   CALCIUM 9.1 12/07/2014 1128   CALCIUM 7.4* 11/19/2014 0510   ALKPHOS 79 12/07/2014 1128   ALKPHOS 94 11/17/2014 2205   AST 17 12/07/2014 1128   AST 22 11/17/2014 2205   ALT 14 12/07/2014 1128   ALT 20 11/17/2014 2205   BILITOT 0.59 12/07/2014 1128   BILITOT 0.6 11/17/2014 2205       STUDIES: Dg Chest 2 View  11/18/2014   CLINICAL DATA:  Right breast cancer. Recent right thoracentesis. Fever and shortness of breath.  EXAM: CHEST  2 VIEW  COMPARISON:  11/17/2014  FINDINGS: Improved aeration at the right lung base compatible with recent thoracentesis. Right basilar densities are compatible with atelectasis and residual pleural fluid. Negative for pneumothorax. There is a left jugular Port-A-Cath. The catheter tip is in the lower SVC region. Left lung is clear. Heart size is normal.  IMPRESSION: Improved aeration in the right lung compatible  with recent thoracentesis. Right basilar densities are compatible with atelectasis or residual pleural fluid. Negative for pneumothorax.  Port-A-Cath as described.   Electronically Signed   By: Markus Daft M.D.   On: 11/18/2014 14:48   Mr Jeri Cos QZ Contrast  12/03/2014   CLINICAL DATA:  49 year old female with breast 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 Targeting for stereotactic radio surgery requested. Subsequent encounter.  EXAM: MRI HEAD WITHOUT  AND WITH CONTRAST  TECHNIQUE: Multiplanar, multiecho pulse sequences of the brain and surrounding structures were obtained without and with intravenous contrast.  CONTRAST:  32m MULTIHANCE GADOBENATE DIMEGLUMINE 529 MG/ML IV SOLN  COMPARISON:  11/16/2014 brain MRI, and earlier  FINDINGS: Both the small 3-4 mm medial right cerebellar tonsil metastasis an the more central right cerebellar hemisphere metastasis have mildly decreased in size and conspicuity since 11/16/2014. These are annotated on series 10.  I also suspect a subtle punctate right cerebellar hemisphere metastasis near the previously described lesion (series 10, image 52). Wrap artifact along the anterior aspect of the left cerebellum (e.g. Image 53).  2-3 mm small midline superior cerebellar metastasis on image 60.  No brainstem or supratentorial brain metastasis is identified no dural thickening identified. Incidental small anterior right frontal lobe developmental venous anomaly (series 10, image 89).  No cerebellar edema or mass effect. No restricted diffusion or evidence of acute infarction. No ventriculomegaly. Normal basilar cisterns. No acute intracranial hemorrhage identified. Normal for age gray and white matter signal. Negative pituitary and cervicomedullary junction. Grossly negative visualized cervical spine. Orbits soft tissues appear normal. Negative scalp soft tissues. Negative paranasal sinuses. Major intracranial vascular flow voids are within normal limits.  Cystic structure or fluid at the  right petrous apex (series 6, image 8) has mildly heterogeneous intrinsic T1 signal and is not definitely enhancing. No associated diffusion abnormality. FBurtis Junesthis is benign and postinflammatory.  Decreased T1 bone marrow signal throughout the visible skeleton, nonspecific. No destructive osseous lesion identified.  IMPRESSION: 1. Four small (3-4 mm max) cerebellar metastases. The 2 identified in June appear slightly regressed. No associated cerebellar edema or mass effect. 2. Decreased T1 bone marrow signal is nonspecific, without strong evidence of osseous metastatic disease at this time.   Electronically Signed   By: HGenevie AnnM.D.   On: 12/03/2014 12:52   Mr BJeri CosWUXContrast  11/16/2014   CLINICAL DATA:  Breast cancer. Staging. No reported neurologic symptoms.  EXAM: MRI HEAD WITHOUT AND WITH CONTRAST  TECHNIQUE: Multiplanar, multiecho pulse sequences of the brain and surrounding structures were obtained without and with intravenous contrast.  CONTRAST:  MultiHance 17 mL.  COMPARISON:  CT head 10/28/2014.  FINDINGS: No acute stroke, acute hemorrhage, hydrocephalus, or extra-axial fluid. Normal cerebral volume. No significant white matter disease.  No definite osseous metastatic disease. Upper cervical region unremarkable. Trapped fluid in the RIGHT petrous apex, non worrisome.  Post infusion, two metastatic lesions are identified. The larger lesion is in the RIGHT cerebellar tonsil, 4 mm diameter, adjacent to the foramen of Luschka (image 13 series 11). A smaller lesion is seen in the RIGHT lateral cerebellum, 3 mm diameter, (image 19 series 11), adjacent to a superiorly draining cerebellar developmental venous anomaly. No mass effect or significant surrounding edema. No other definite intracranial metastatic lesions are seen. Incidental additional second DVA RIGHT medial frontal cortex, seen on image 41 series 11.  Major dural venous sinuses are patent. Negative orbits. No sinus air-fluid level. 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 No significant edema or mass effect from either.  Findings discussed with ordering provider.   Electronically Signed   By: JStaci RighterM.D.   On: 11/16/2014 09:08   Ir Fluoro Guide Cv Line Left  11/12/2014   CLINICAL DATA:  49year old with metastatic breast cancer.  EXAM: FLUOROSCOPIC AND ULTRASOUND GUIDED  PLACEMENT OF A SUBCUTANEOUS PORT.  Physician: Stephan Minister. Henn, MD  FLUOROSCOPY TIME:  2 minutes and 18 seconds.  18 mGy  MEDICATIONS AND MEDICAL HISTORY: Versed 4.0 mg, Fentanyl 50 mcg. A radiology nurse monitored the patient for moderate sedation.  As antibiotic prophylaxis, Ancef 2 gm was ordered pre-procedure and administered intravenously within one hour of incision.  ANESTHESIA/SEDATION: Moderate sedation time: 47 minutes  PROCEDURE: The risks of the procedure were explained to the patient. Informed consent was obtained. Patient was placed supine on the interventional table. Ultrasound confirmed a patent left internal jugular vein. The left chest and neck were cleaned with a skin antiseptic and a sterile drape was placed. Maximal barrier sterile technique was utilized including caps, mask, sterile gowns, sterile gloves, sterile drape, hand hygiene and skin antiseptic. The left neck was anesthetized with 1% lidocaine. Small incision was made in the left neck with a blade. Micropuncture set was placed in the left IJ with ultrasound guidance. The left chest was anesthetized with 1% lidocaine with epinephrine. #15 blade was used to make an incision and a subcutaneous port pocket was formed. [place] was selected. Subcutaneous tunnel was formed with a stiff tunneling device. The port catheter was brought through the subcutaneous tunnel. The micropuncture set was exchanged for a peel-away sheath. The catheter was placed through the  peel-away sheath and the tip was positioned at the superior cavoatrial junction. Catheter was attached to the port and placed within the subcutaneous pocket. Catheter placement was confirmed with fluoroscopy. The port was accessed and flushed with heparinized saline. The port pocket was closed using two layers of absorbable sutures and Dermabond. The vein skin site was closed using a single layer of absorbable suture and Dermabond. Sterile dressings were applied. Patient tolerated the procedure well without an immediate complication. Ultrasound and fluoroscopic images were taken and saved for this procedure.  Estimated blood loss: Minimal  COMPLICATIONS: None  IMPRESSION: Placement of a subcutaneous port device. Catheter tip at the superior cavoatrial junction and ready to be used.   Electronically Signed   By: Markus Daft M.D.   On: 11/12/2014 16:00   Ir US Guide Vasc Access Left  11/12/2014   CLINICAL DATA:  49 year old with metastatic breast cancer.  EXAM: FLUOROSCOPIC AND ULTRASOUND GUIDED PLACEMENT OF A SUBCUTANEOUS PORT.  Physician: Stephan Minister. Henn, MD  FLUOROSCOPY TIME:  2 minutes and 18 seconds.  18 mGy  MEDICATIONS AND MEDICAL HISTORY: Versed 4.0 mg, Fentanyl 50 mcg. A radiology nurse monitored the patient for moderate sedation.  As antibiotic prophylaxis, Ancef 2 gm was ordered pre-procedure and administered intravenously within one hour of incision.  ANESTHESIA/SEDATION: Moderate sedation time: 47 minutes  PROCEDURE: The risks of the procedure were explained to the patient. Informed consent was obtained. Patient was placed supine on the interventional table. Ultrasound confirmed a patent left internal jugular vein. The left chest and neck were cleaned with a skin antiseptic and a sterile drape was placed. Maximal barrier sterile technique was utilized including caps, mask, sterile gowns, sterile gloves, sterile drape, hand hygiene and skin antiseptic. The left neck was anesthetized with 1% lidocaine. Small  incision was made in the left neck with a blade. Micropuncture set was placed in the left IJ with ultrasound guidance. The left chest was anesthetized with 1% lidocaine with epinephrine. #15 blade was used to make an incision and a subcutaneous port pocket was formed. [place] was selected. Subcutaneous tunnel was formed with a stiff tunneling  device. The port catheter was brought through the subcutaneous tunnel. The micropuncture set was exchanged for a peel-away sheath. The catheter was placed through the peel-away sheath and the tip was positioned at the superior cavoatrial junction. Catheter was attached to the port and placed within the subcutaneous pocket. Catheter placement was confirmed with fluoroscopy. The port was accessed and flushed with heparinized saline. The port pocket was closed using two layers of absorbable sutures and Dermabond. The vein skin site was closed using a single layer of absorbable suture and Dermabond. Sterile dressings were applied. Patient tolerated the procedure well without an immediate complication. Ultrasound and fluoroscopic images were taken and saved for this procedure.  Estimated blood loss: Minimal  COMPLICATIONS: None  IMPRESSION: Placement of a subcutaneous port device. Catheter tip at the superior cavoatrial junction and ready to be used.   Electronically Signed   By: Markus Daft M.D.   On: 11/12/2014 16:00   Dg Chest Portable 1 View  11/17/2014   CLINICAL DATA:  Fever. Cough and shortness of breath for 2 weeks. History of metastatic breast cancer.  EXAM: PORTABLE CHEST - 1 VIEW  COMPARISON:  PET-CT 11/06/2014, chest radiographs 10/29/2014  FINDINGS: Tip of the accessed left chest port in the mid SVC. Moderate-large right pleural effusion occupying the lower 1/2 of right hemithorax. No pneumothorax. There is adjacent opacity at the right lung base, likely combination of atelectasis and known metastatic disease. No consolidation in the aerated right upper or  left lung. The heart size and mediastinal contours are normal for technique.  IMPRESSION: Moderate to large right pleural effusion occupying the lower 1/2 of right hemithorax. Upper right lung and left lung are clear.   Electronically Signed   By: Jeb Levering M.D.   On: 11/17/2014 22:43   Ir Thoracentesis Asp Pleural Space W/img Guide  11/18/2014   INDICATION: 49 year old female with a history of breast carcinoma, right-sided. She has been referred for a ultrasound-guided paracentesis.  EXAM: IR THORACENTESIS ASP PLEURAL SPACE W/IMG GUIDE  COMPARISON:  11/12/2014, 11/06/2014, 10/29/2014  MEDICATIONS: The patient is currently admitted to the hospital and receiving intravenous antibiotics. The antibiotics were administered within an appropriate time frame prior to the initiation of the procedure.  ANESTHESIA/SEDATION: None  CONTRAST:  None  COMPLICATIONS: None  PROCEDURE: Informed written consent was obtained from the patient after a discussion of the risks, benefits and alternatives to treatment. The patient was seated on a gantry in the fluoroscopy suite, and ultrasound survey of the posterior right thorax was performed with images stored and sent to PACs.  The patient was then prepped and draped in the usual sterile fashion using Betadine prep.  Once the patient was prepped and draped sterilely, the skin and subcutaneous tissues were generously infiltrated with 1% lidocaine for local anesthesia the level of the pleura.  A Yueh needle was advanced into the pleural space with confirmation by aspiration of pleural fluid. The UE catheter was advanced into the space in needle was removed.  Approximately 1100 cc of thin yellow fluid were aspirated. A sample was sent to the lab for pre ordered labs.  Catheter was removed at the completion, and the patient tolerated the procedure well.  No complications were encountered and no significant blood loss was encountered.  IMPRESSION: Status post ultrasound-guided  right-sided thoracentesis, with sample sent the lab for analysis. Approximately 1100 cc of thin yellow fluid was aspirated.  Signed,  Dulcy Fanny. Earleen Newport, DO  [facility]  [facility]  Radiology   Electronically Signed   By: Corrie Mckusick D.O.   On: 11/18/2014 13:29    ASSESSMENT: 49 y.o. BRCA negative [place] woman with triple-negative stage IV breast cancer  (1) an status post right breast upper outer quadrant and right axillary lymph node biopsy 04/04/2012, both positive for an invasive ductal carcinoma, grade 3, triple negative, with an MIB-1 of 25%  (2) Treated neoadjuvantly with  (a) fluorouracil, cyclophosphamide, and epirubicin (at 100 mg/M2) x4 completed 06/07/2012  (b) docetaxel (75 mg/M2) for one dose, 06/21/2012, poorly tolerated  (c) carboplatin and gemcitabine given every 21 days for 6 cycles completed 10/11/2012  (3) status post right modified radical mastectomy 11/18/2012 showing a complete pathologic response--all 16 lymph nodes were benign  (a) no plans for reconstructon  (4) adjuvant radiation therapy completed 03/06/2013  METASTATIC DISEASE June 2016 (5) 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, 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  (6) staging PET scan 11/06/2014 showed significant disease in the middle and lower lobes of the Right lung, Right effusion, multiple liver and bone lesions, as well as left lung nodules, a Right adrenal nodule, and widespread hypermetabolic adenopathy  (a) brain MRI 11/16/2014 showed 2 cerebellar lesions; SRS pending  (7) abraxane day 1 and day 8 of each 21 day cycle started 11/09/2014  (8) zolendronate started 11/16/2014, to be repeated every 12 weeks  (9) Foundation 1 study sent  11/06/2014: patient's sister in law Garvin Fila following [phone])  PLAN: Yailine looks and feels well today. The labs were reviewed in detail and were entirely stable. She will proceed with  day 8, cycle 2 of abraxane as planned today. She will be due for neulasta with this this dose, and plans to continue on with the onpro injector so I have canceled the injection appointment scheduled for tomorrow.   Sidnie will return in 2 weeks prior to the start of cycle 3. She understands and agrees with this plan. She knows the goal of treatment in her case is control. She has been encouraged to call with any issues that might arise before her next visit here.   Laurie Panda, NP   12/07/2014 12:19 PM

## 2014-12-08 ENCOUNTER — Ambulatory Visit: Payer: BLUE CROSS/BLUE SHIELD

## 2014-12-08 DIAGNOSIS — C50911 Malignant neoplasm of unspecified site of right female breast: Secondary | ICD-10-CM | POA: Diagnosis not present

## 2014-12-08 NOTE — Addendum Note (Signed)
Encounter addended by: Benn Moulder, RN on: 12/08/2014  7:44 PM<BR>     Documentation filed: Arn Medal VN

## 2014-12-09 ENCOUNTER — Telehealth: Payer: Self-pay | Admitting: Oncology

## 2014-12-09 DIAGNOSIS — C50911 Malignant neoplasm of unspecified site of right female breast: Secondary | ICD-10-CM | POA: Diagnosis not present

## 2014-12-09 NOTE — Telephone Encounter (Signed)
Faxed records to [facility]

## 2014-12-10 ENCOUNTER — Telehealth: Payer: Self-pay | Admitting: *Deleted

## 2014-12-10 NOTE — Telephone Encounter (Signed)
LEFT VOICE MAIL WITH DARLENA CLARK TO CALL PT. CONCERNING INSURANCE ISSUE. RETURNED [place] PHONE NUMBER.

## 2014-12-11 ENCOUNTER — Encounter: Payer: Self-pay | Admitting: Radiation Oncology

## 2014-12-11 ENCOUNTER — Ambulatory Visit
Admission: RE | Admit: 2014-12-11 | Discharge: 2014-12-11 | Disposition: A | Discharge status: Home / Self Care | Payer: BLUE CROSS/BLUE SHIELD | Source: Ambulatory Visit | Attending: Radiation Oncology | Admitting: Radiation Oncology

## 2014-12-11 ENCOUNTER — Other Ambulatory Visit: Payer: Self-pay | Admitting: Radiation Oncology

## 2014-12-11 ENCOUNTER — Encounter: Payer: Self-pay | Admitting: Oncology

## 2014-12-11 DIAGNOSIS — C50911 Malignant neoplasm of unspecified site of right female breast: Secondary | ICD-10-CM | POA: Diagnosis not present

## 2014-12-11 DIAGNOSIS — C7931 Secondary malignant neoplasm of brain: Secondary | ICD-10-CM

## 2014-12-11 MED ORDER — DEXAMETHASONE 4 MG PO TABS: ORAL_TABLET | ORAL | Status: DC

## 2014-12-11 NOTE — Progress Notes (Signed)
2nd set vitals taken, no discomfort, no nausea, head ache or dizzybness expressed by patient, encouraged her to cal for any unusual symptoms ,no driving, if fever >421.0 or vomiting call on MD, escorted patient ambulatory steady gait to front lobby ,patient waiting for her ride gave verbal understanding  Has 1 month f/u appt card 01/15/15 4pm 2:33 PM

## 2014-12-11 NOTE — Progress Notes (Signed)
  Radiation Oncology         [phone]) [phone] ________________________________  Stereotactic Treatment Procedure Note  Name: Nicole Valentine MRN: [id number]  Date: 12/11/2014  DOB: Jul 09, 1965  SPECIAL TREATMENT PROCEDURE   ICD-9-CM ICD-10-CM   1. Brain metastases 198.3 C79.31      3D TREATMENT PLANNING AND DOSIMETRY:  The patient's radiation plan was reviewed and approved by neurosurgery and radiation oncology prior to treatment.  It showed 3-dimensional radiation distributions overlaid onto the planning CT/MRI image set.  The [facility] for the target structures as well as the organs at risk were reviewed. The documentation of the 3D plan and dosimetry are filed in the radiation oncology EMR.  NARRATIVE:  Nicole Valentine was brought to the TrueBeam stereotactic radiation treatment machine and placed supine on the CT couch. The head frame was applied, and the patient was set up for stereotactic radiosurgery.  Neurosurgery was present for the set-up and delivery  SIMULATION VERIFICATION:  In the couch zero-angle position, the patient underwent Exactrac imaging using the Brainlab system with orthogonal KV images.  These were carefully aligned and repeated to confirm treatment position for each of the isocenters.  The Exactrac snap film verification was repeated at each couch angle.  SPECIAL TREATMENT PROCEDURE: Nicole Valentine received stereotactic radiosurgery to the following targets:  Right Cerebellar Tonsil 38mm target was treated using 4 Circular Arcs to a prescription dose of 20 Gy.  ExacTrac Snap verification was performed for each couch angle.  Right Cerebellar Midline 15mm target was treated using 3 Circular Arcs to a prescription dose of 20 Gy.  ExacTrac Snap verification was performed for each couch angle.  Right Cerebellar Hemis 69mm target was treated using 4 Circular Arcs to a prescription dose of 20 Gy.  ExacTrac Snap verification was performed for each couch angle.  Right Inf Cerebellar  Hemis 53mm target was treated using 4 Circular Arcs to a prescription dose of 20 Gy.  ExacTrac Snap verification was performed for each couch angle.   This constitutes a special treatment procedure due to the ablative dose delivered and the technical nature of treatment.  This highly technical modality of treatment ensures that the ablative dose is centered on the patient's tumor while sparing normal tissues from excessive dose and risk of detrimental effects.   STEREOTACTIC TREATMENT MANAGEMENT:  Following delivery, the patient was transported to nursing in stable condition and monitored for possible acute effects.  Vital signs were recorded BP 139/84 mmHg  Pulse 100  Temp(Src) 98.2 F (36.8 C) (Oral)  Resp 20  LMP 03/29/2012. The patient tolerated treatment without significant acute effects, and was discharged to home in stable condition.    PLAN: Follow-up in one month.  ________________________________   Eppie Gibson, MD

## 2014-12-11 NOTE — Progress Notes (Signed)
Patient ambulatory to dressing room 1 in nursing,  S/p SRS brain tx, vitals taken, no c/o head aches,dizzyness, nausea or pain, call bell within reach, tv on, offered warm blanket,patient has her own water,continue to monitor 1:44 PM

## 2014-12-11 NOTE — Progress Notes (Signed)
Patient called this evening reporting N/V and post-nasal drip (r/t allergies) but no HA or other sx after SRS today.   Escalate diet slowly. Advised to try her current anti-emetics first.  Called in Rx for Decadron if needed.  Advised her to go to ER if symptoms worsen and she feels dehydrated. -----------------------------------  Eppie Gibson, MD

## 2014-12-11 NOTE — Progress Notes (Signed)
Returned pt's call.  Left msg to return my call.  °

## 2014-12-14 ENCOUNTER — Encounter: Payer: Self-pay | Admitting: *Deleted

## 2014-12-14 NOTE — Progress Notes (Signed)
Called pt to touch base and follow up with her regarding triage calls on Friday.  Pt reports they spoke with Dr. Isidore Moos regarding headaches and nausea she was having Friday after treatment.  Pt is feeling better and will call for further concerns.

## 2014-12-15 ENCOUNTER — Other Ambulatory Visit: Payer: Self-pay | Admitting: *Deleted

## 2014-12-18 NOTE — Addendum Note (Signed)
Encounter addended by: Jenene Slicker, RN on: 12/18/2014 11:18 AM<BR>     Documentation filed: Charges VN

## 2014-12-18 NOTE — Addendum Note (Signed)
Encounter addended by: Doreen Beam, RN on: 12/18/2014  9:05 AM<BR>     Documentation filed: Charges VN

## 2014-12-20 NOTE — Progress Notes (Signed)
And a Patient ID: Nicole Valentine, female   DOB: Oct 06, 1965, 49 y.o.   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 She had a mammogram performed that showed dense breasts bilaterally. Ultrasound of the right breast showed 2.3 x 1.9 cm area of abnormality with multiple abnormal lymph nodes. MRI of the bilateral breasts showed asymmetrical enhancement throughout the right breast consistent with multicentric disease. An ultrasound-guided biopsy performed. The known area of disease measured 1.9 x 1.9 x 2.3 cm. Multiple abnormal positive right axillary lymph nodes were noted. 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 Biopsy of the right axillary lymph node was positive for malignancy with extracapsular extension. Patient was originally seen by Dr. [name] Dr. [name] and Dr. [name]. She has elected to have a right mastectomy eventually and declined biopsies of any other areas within the breast."  She went on to receive neoadjuvant chemotherapy and attained a complete pathologic response, as documented below  [place] noted a very small right supraclavicular mass 09/25/2014, which she brought to our attention. She was having some allergy symptoms at the time so we decided to reevaluate this after a few weeks and on 10/22/2014 as the mass persisted we obtained a restaging neck CT scan, 10/28/2014. There was bilateral supraclavicular adenopathy, but also a large right pleural effusion was incidentally noted. We proceeded to right thoracentesis 10/29/2014,  and a liter of hazy yellow fluid was removed. Cytology from this procedure ([id number]) showed malignant cells consistent with adenocarcinoma, estrogen and progesterone receptor negative, HER-2 not amplified, with a signals ratio 1.12 and the number per cell being 2.75, and with an MIB-1 of 80%. I called Tamee with these results and set her up for a PET scan performed 11/06/2014, which shows widespread metastatic disease involving particularly the right lung, liver, and bones, but also the left lung, right adrenal, and multiple lymph node areas.  Her subsequent history is as detailed below.  INTERVAL HISTORY: Nicole Valentine returns today for follow-up of her metastatic breast cancer. Today is day 1 cycle 3 of 8 planned cycles of Abraxane, given days 1 and 8 of each 21 day cycle, with onpro support. Since the last visit here she saw Dr. Hiram Comber at [facility] and established herself there. Dr. Hiram Comber thought continuing the current treatment until disease progression was reasonable so long as the patient had a tolerance. When there is disease progression she will return there for consideration of alternatives   REVIEW OF S/YSTEMS: Nicole Valentine feels she is doing very well. Her shortness of breath is improved although she still sleeps on a recliner instead of flat on her back. She has good energy, right her stationary bike daily and takes a walk indoors daily. Should the only side effect she has had from the chemotherapy so far is a hair loss. Specifically she denies any peripheral neuropathy. The children will be seeing a counselor I within the next 2 weeks but they are doing well. Detailed review of systems today was otherwise stable  PAST MEDICAL HISTORY: Past Medical History  Diagnosis Date  . Complication of anesthesia  her father and uncle both had very difficult time waking up-was  something they told them may be hereditory. she will find out.  [name] Hypothyroidism   . 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 Radiation  01/21/13-03/06/13    Right Breast  . Breast cancer dx'd 04-10-12-rt    PAST SURGICAL HISTORY: Past Surgical History  Procedure Laterality Date  . Wisdom tooth extraction    . Portacath placement  04/22/2012    Procedure: INSERTION PORT-A-CATH;  Surgeon: [name], MD;  Location: [place];  Service: General;  Laterality: Left;  [name] Modified mastectomy Right 11/18/2012    Procedure:  RIGHT MODIFIED MASTECTOMY;  Surgeon: [name], MD;  Location: [place];  Service: General;  Laterality: Right;  . Knee arthroscopy Right 11/26/2013  . Port-a-cath removal Left 12/10/2013    Procedure: MINOR REMOVAL PORT-A-CATH;  Surgeon: [name], MD;  Location: [place];  Service: General;  Laterality: Left;    FAMILY HISTORY Family History  Problem Relation Age of Onset  . Lung cancer Maternal Grandfather 7    smoker  . Prostate cancer Maternal Grandfather 90  . Throat cancer Other     Great Aunt x 2  . Liver cancer Other     Maternal Great Grandmother  . Melanoma Maternal Uncle 81  . Brain cancer Cousin 11    non-malignant  the patient's parents are living, in their early 38s. The patient has 2 brothers, no sisters. The patient's mother was diagnosed with breast cancer, HER-2 positive, in 2014 in [place].  GYNECOLOGIC HISTORY:   (Updated 10/03/2013) Menarche age 9, first live birth age 73. She is GX P4. LMP January 2014. 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  The children are currently ages 34, 33, 1, and 68. Her husband, [name], is a Customer service manager.He just started a job for [employer].  They attend the [employer] DIRECTIVES: The patient's husband is her HCPOA   HEALTH MAINTENANCE:  (Updated 10/03/2013) History  Substance Use Topics  . Smoking status: Never Smoker   . Smokeless tobacco: Never  Used  . Alcohol Use: No     Colonoscopy: Never  PAP: November 2013/Dr. Smith  Bone density: January 2015, [facility], normal  Lipid panel:  Not on file  Allergies  Allergen Reactions  . Almond Meal Rash  . Other Rash    LOTIONS    Current Outpatient Prescriptions  Medication Sig Dispense Refill  . cholecalciferol (VITAMIN D) 1000 UNITS tablet Take 2,000 Units by mouth 2 (two) times daily.     [name] ibuprofen (ADVIL,MOTRIN) 200 MG tablet Take 400 mg by mouth every 6 (six) hours as needed for headache or moderate pain.    [name] levothyroxine (SYNTHROID, LEVOTHROID) 175 MCG tablet Take 175 mcg by mouth daily before breakfast.    . ondansetron (ZOFRAN) 8 MG tablet Take 1 tablet (8 mg total) by mouth 2 (two) times daily. Start the day after chemo for 2 days. Then take as needed for nausea or vomiting. (Patient not taking: Reported on 12/04/2014) 30 tablet 1  . pegfilgrastim (NEULASTA) 6 MG/0.6ML injection Inject 6 mg into the skin once. Patch delivery system after chemo treatments    . prochlorperazine (COMPAZINE) 10 MG tablet Take 1 tablet (10 mg total) by mouth every 6 (six) hours as needed (Nausea  or vomiting). (Patient not taking: Reported on 12/04/2014) 30 tablet 1  . saccharomyces boulardii (FLORASTOR) 250 MG capsule Take 1 capsule (250 mg total) by mouth 2 (two) times daily. (Patient taking differently: Take 250 mg by mouth 2 (two) times daily. Pt takes 2 capsules 250 mg each  2 times daily) 60 capsule 2   No current facility-administered medications for this visit.      OBJECTIVE: Middle-aged white woman who appears well Filed Vitals:   12/21/14 0849  BP: 123/64  Pulse: 62  Temp: 98.3 F (36.8 C)  Resp: 18     Body mass index is 27.18 kg/(m^2).    ECOG FS:0 - Asymptomatic Filed Weights   12/21/14 0849  Weight: 173 lb 9.6 oz (78.744 kg)   Sclerae unicteric, pupils round and equal Oropharynx clear and moist-- no thrush or other lesions No cervical or supraclavicular adenopathy Lungs  no rales or rhonchi, minimal wheezes left base Heart regular rate and rhythm Abd soft, nontender, positive bowel sounds MSK no focal spinal tenderness, no upper extremity lymphedema Neuro: nonfocal, well oriented, appropriate affect Breasts: Deferred    LAB RESULTS:  Lab Results  Component Value Date   WBC 2.9* 12/07/2014   NEUTROABS 2.1 12/07/2014   HGB 11.8 12/07/2014   HCT 35.2 12/07/2014   MCV 102.3* 12/07/2014   PLT 214 12/07/2014      Chemistry      Component Value Date/Time   NA 141 12/21/2014 0825   NA 138 11/19/2014 0510   K 3.8 12/21/2014 0825   K 3.2* 11/19/2014 0510   CL 104 11/19/2014 0510   CL 101 10/18/2012 0859   CO2 26 12/21/2014 0825   CO2 27 11/19/2014 0510   BUN 10.1 12/21/2014 0825   BUN 7 11/19/2014 0510   CREATININE 0.7 12/21/2014 0825   CREATININE 0.59 11/19/2014 0510      Component Value Date/Time   CALCIUM 9.4 12/21/2014 0825   CALCIUM 7.4* 11/19/2014 0510   ALKPHOS 95 12/21/2014 0825   ALKPHOS 94 11/17/2014 2205   AST 19 12/21/2014 0825   AST 22 11/17/2014 2205   ALT 11 12/21/2014 0825   ALT 20 11/17/2014 2205   BILITOT 0.65 12/21/2014 0825   BILITOT 0.6 11/17/2014 2205       STUDIES: Mr Jeri Cos Wo Contrast  18-Dec-2014   CLINICAL DATA:  49 year old female with breast 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 Targeting for stereotactic radio surgery requested. Subsequent encounter.  EXAM: MRI HEAD WITHOUT AND WITH CONTRAST  TECHNIQUE: Multiplanar, multiecho pulse sequences of the brain and surrounding structures were obtained without and with intravenous contrast.  CONTRAST:  77m MULTIHANCE GADOBENATE DIMEGLUMINE 529 MG/ML IV SOLN  COMPARISON:  11/16/2014 brain MRI, and earlier  FINDINGS: Both the small 3-4 mm medial right cerebellar tonsil metastasis an the more central right cerebellar hemisphere metastasis have mildly decreased in size and conspicuity since 11/16/2014. These are annotated on series 10.  I also suspect a subtle punctate right  cerebellar hemisphere metastasis near the previously described lesion (series 10, image 52). Wrap artifact along the anterior aspect of the left cerebellum (e.g. Image 53).  2-3 mm small midline superior cerebellar metastasis on image 60.  No brainstem or supratentorial brain metastasis is identified no dural thickening identified. Incidental small anterior right frontal lobe developmental venous anomaly (series 10, image 89).  No cerebellar edema or mass effect. No restricted diffusion or evidence of acute infarction. No ventriculomegaly. Normal basilar cisterns. No acute intracranial hemorrhage identified. Normal  for age gray and white matter signal. Negative pituitary and cervicomedullary junction. Grossly negative visualized cervical spine. Orbits soft tissues appear normal. Negative scalp soft tissues. Negative paranasal sinuses. Major intracranial vascular flow voids are within normal limits.  Cystic structure or fluid at the right petrous apex (series 6, image 8) has mildly heterogeneous intrinsic T1 signal and is not definitely enhancing. No associated diffusion abnormality. Burtis Junes this is benign and postinflammatory.  Decreased T1 bone marrow signal throughout the visible skeleton, nonspecific. No destructive osseous lesion identified.  IMPRESSION: 1. Four small (3-4 mm max) cerebellar metastases. The 2 identified in June appear slightly regressed. No associated cerebellar edema or mass effect. 2. Decreased T1 bone marrow signal is nonspecific, without strong evidence of osseous metastatic disease at this time.   Electronically Signed   By: Genevie Ann M.D.   On: 12/03/2014 12:52    ASSESSMENT: 49 y.o. BRCA negative [place] woman with triple-negative stage IV breast cancer  (1) an status post right breast upper outer quadrant and right axillary lymph node biopsy 04/04/2012, both positive for an invasive ductal carcinoma, grade 3, triple negative, with an MIB-1 of 25%  (2) Treated neoadjuvantly  with  (a) fluorouracil, cyclophosphamide, and epirubicin (at 100 mg/M2) x4 completed 06/07/2012  (b) docetaxel (75 mg/M2) for one dose, 06/21/2012, poorly tolerated  (c) carboplatin and gemcitabine given every 21 days for 6 cycles completed 10/11/2012  (3) status post right modified radical mastectomy 11/18/2012 showing a complete pathologic response--all 16 lymph nodes were benign  (a) no plans for reconstructon  (4) adjuvant radiation therapy completed 03/06/2013  METASTATIC DISEASE June 2016 (5) 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, 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  (6) staging PET scan 11/06/2014 showed significant disease in the middle and lower lobes of the Right lung, Right effusion, multiple liver and bone lesions, as well as left lung nodules, a Right adrenal nodule, and widespread hypermetabolic adenopathy  (a) brain MRI 11/16/2014 showed 2 cerebellar lesions; SRS pending  (7) abraxane day 1 and day 8 of each 21 day cycle started 11/09/2014  (8) zolendronate started 11/16/2014, to be repeated every 12 weeks  (9) Foundation 1 study sent  11/06/2014: patient's sister in law Garvin Fila following [phone])  (a) 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[place], NTRK1, MYC, ARID1A and LYN, none w approved therapies  (b) pazopanib, ponatinib or crizotinib suggested as possible off-protocol options  PLAN: Nicole Valentine is tolerating her Abraxane well. She will start her third cycle today. She is a ready scheduled for restaging PET scan August 16. I have made her an appointment for the following day she will be seen at [facility] on the 17th. Instead she will return here on the 22nd hopefully to start cycle 4 but that of course will depend on the PET scan results and whether the Nicole Valentine suggests a change  She also has an appointment with Dr. Isidore Moos later this month. The brain MRI is scheduled to be repeated in October  At this point I am pleased to Nicole Valentine is tolerating her  treatment so well and that we have some clinical evidence of response. I have changed the timing of her August 8 visit because of family coming into town that same day. She will let us know if any problems develop at any point in her treatment course [name],[name], MD   12/21/2014 9:26 AM

## 2014-12-21 ENCOUNTER — Ambulatory Visit (HOSPITAL_BASED_OUTPATIENT_CLINIC_OR_DEPARTMENT_OTHER): Payer: BLUE CROSS/BLUE SHIELD

## 2014-12-21 ENCOUNTER — Ambulatory Visit (HOSPITAL_BASED_OUTPATIENT_CLINIC_OR_DEPARTMENT_OTHER): Payer: BLUE CROSS/BLUE SHIELD | Admitting: Oncology

## 2014-12-21 ENCOUNTER — Ambulatory Visit: Payer: BLUE CROSS/BLUE SHIELD

## 2014-12-21 ENCOUNTER — Other Ambulatory Visit (HOSPITAL_BASED_OUTPATIENT_CLINIC_OR_DEPARTMENT_OTHER): Payer: BLUE CROSS/BLUE SHIELD

## 2014-12-21 VITALS — BP 123/64 | HR 62 | Temp 98.3°F | Resp 18 | Ht 67.0 in | Wt 173.6 lb

## 2014-12-21 DIAGNOSIS — C778 Secondary and unspecified malignant neoplasm of lymph nodes of multiple regions: Secondary | ICD-10-CM

## 2014-12-21 DIAGNOSIS — C50411 Malignant neoplasm of upper-outer quadrant of right female breast: Secondary | ICD-10-CM | POA: Diagnosis not present

## 2014-12-21 DIAGNOSIS — C50911 Malignant neoplasm of unspecified site of right female breast: Secondary | ICD-10-CM | POA: Diagnosis present

## 2014-12-21 DIAGNOSIS — Z510 Encounter for antineoplastic radiation therapy: Secondary | ICD-10-CM | POA: Diagnosis not present

## 2014-12-21 DIAGNOSIS — C7802 Secondary malignant neoplasm of left lung: Secondary | ICD-10-CM

## 2014-12-21 DIAGNOSIS — C50919 Malignant neoplasm of unspecified site of unspecified female breast: Secondary | ICD-10-CM

## 2014-12-21 DIAGNOSIS — C7951 Secondary malignant neoplasm of bone: Secondary | ICD-10-CM

## 2014-12-21 DIAGNOSIS — C787 Secondary malignant neoplasm of liver and intrahepatic bile duct: Secondary | ICD-10-CM

## 2014-12-21 DIAGNOSIS — C7801 Secondary malignant neoplasm of right lung: Secondary | ICD-10-CM

## 2014-12-21 DIAGNOSIS — C7971 Secondary malignant neoplasm of right adrenal gland: Secondary | ICD-10-CM

## 2014-12-21 DIAGNOSIS — C7931 Secondary malignant neoplasm of brain: Secondary | ICD-10-CM | POA: Diagnosis present

## 2014-12-21 DIAGNOSIS — Z95828 Presence of other vascular implants and grafts: Secondary | ICD-10-CM

## 2014-12-21 DIAGNOSIS — Z5111 Encounter for antineoplastic chemotherapy: Secondary | ICD-10-CM | POA: Diagnosis not present

## 2014-12-21 LAB — COMPREHENSIVE METABOLIC PANEL (CC13)
ALT: 11 U/L (ref 0–55)
AST: 19 U/L (ref 5–34)
Albumin: 3.8 g/dL (ref 3.5–5.0)
Alkaline Phosphatase: 95 U/L (ref 40–150)
Anion Gap: 9 mEq/L (ref 3–11)
BUN: 10.1 mg/dL (ref 7.0–26.0)
CALCIUM: 9.4 mg/dL (ref 8.4–10.4)
CHLORIDE: 106 meq/L (ref 98–109)
CO2: 26 meq/L (ref 22–29)
Creatinine: 0.7 mg/dL (ref 0.6–1.1)
EGFR: >90 mL/min/{1.73_m2} (ref >90)
Glucose: 119 mg/dl (ref 70–140)
Potassium: 3.8 mEq/L (ref 3.5–5.1)
Sodium: 141 mEq/L (ref 136–145)
Total Bilirubin: 0.65 mg/dL (ref 0.20–1.20)
Total Protein: 6.6 g/dL (ref 6.4–8.3)

## 2014-12-21 MED ORDER — SODIUM CHLORIDE 0.9 % IV SOLN
Freq: Once | INTRAVENOUS | Status: AC
  Administered 2014-12-21: 10:00:00 via INTRAVENOUS

## 2014-12-21 MED ORDER — SODIUM CHLORIDE 0.9 % IV SOLN
Freq: Once | INTRAVENOUS | Status: AC
  Administered 2014-12-21: 11:00:00 via INTRAVENOUS
  Filled 2014-12-21: qty 4

## 2014-12-21 MED ORDER — SODIUM CHLORIDE 0.9 % IJ SOLN
10.0000 mL | INTRAMUSCULAR | Status: DC | PRN
Start: 2014-12-21 — End: 2014-12-21
  Administered 2014-12-21: 10 mL via INTRAVENOUS
  Filled 2014-12-21: qty 10

## 2014-12-21 MED ORDER — PACLITAXEL PROTEIN-BOUND CHEMO INJECTION 100 MG
100.0000 mg/m2 | Freq: Once | INTRAVENOUS | Status: AC
  Administered 2014-12-21: 200 mg via INTRAVENOUS
  Filled 2014-12-21: qty 40

## 2014-12-21 MED ORDER — HEPARIN SOD (PORK) LOCK FLUSH 100 UNIT/ML IV SOLN
500.0000 [IU] | Freq: Once | INTRAVENOUS | Status: AC | PRN
  Administered 2014-12-21: 500 [IU]
  Filled 2014-12-21: qty 5

## 2014-12-21 MED ORDER — SODIUM CHLORIDE 0.9 % IJ SOLN
10.0000 mL | INTRAMUSCULAR | Status: DC | PRN
Start: 2014-12-21 — End: 2014-12-21
  Administered 2014-12-21: 10 mL
  Filled 2014-12-21: qty 10

## 2014-12-21 NOTE — Patient Instructions (Signed)

## 2014-12-21 NOTE — Patient Instructions (Signed)
[facility] Discharge Instructions for Patients Receiving Chemotherapy  Today you received the following chemotherapy agents: Abraxane  To help prevent nausea and vomiting after your treatment, we encourage you to take your nausea medication: Compazine 10 mg every 6 hours as needed, Zofran 8 mg every 12 hours for 2 days after chemo.   If you develop nausea and vomiting that is not controlled by your nausea medication, call the clinic.   BELOW ARE SYMPTOMS THAT SHOULD BE REPORTED IMMEDIATELY:  *FEVER GREATER THAN 100.5 F  *CHILLS WITH OR WITHOUT FEVER  NAUSEA AND VOMITING THAT IS NOT CONTROLLED WITH YOUR NAUSEA MEDICATION  *UNUSUAL SHORTNESS OF BREATH  *UNUSUAL BRUISING OR BLEEDING  TENDERNESS IN MOUTH AND THROAT WITH OR WITHOUT PRESENCE OF ULCERS  *URINARY PROBLEMS  *BOWEL PROBLEMS  UNUSUAL RASH Items with * indicate a potential emergency and should be followed up as soon as possible.  Feel free to call the clinic you have any questions or concerns. The clinic phone number is [phone].  Please show the [facility] at check-in to the Emergency Department and triage nurse.

## 2014-12-28 ENCOUNTER — Other Ambulatory Visit: Payer: Self-pay | Admitting: Hematology and Oncology

## 2014-12-28 ENCOUNTER — Ambulatory Visit: Payer: BLUE CROSS/BLUE SHIELD

## 2014-12-28 ENCOUNTER — Encounter: Payer: Self-pay | Admitting: Nurse Practitioner

## 2014-12-28 ENCOUNTER — Other Ambulatory Visit (HOSPITAL_BASED_OUTPATIENT_CLINIC_OR_DEPARTMENT_OTHER): Payer: BLUE CROSS/BLUE SHIELD

## 2014-12-28 ENCOUNTER — Ambulatory Visit (HOSPITAL_BASED_OUTPATIENT_CLINIC_OR_DEPARTMENT_OTHER): Payer: BLUE CROSS/BLUE SHIELD

## 2014-12-28 ENCOUNTER — Ambulatory Visit (HOSPITAL_BASED_OUTPATIENT_CLINIC_OR_DEPARTMENT_OTHER): Payer: BLUE CROSS/BLUE SHIELD | Admitting: Nurse Practitioner

## 2014-12-28 ENCOUNTER — Other Ambulatory Visit: Payer: Self-pay | Admitting: Nurse Practitioner

## 2014-12-28 ENCOUNTER — Ambulatory Visit: Payer: BLUE CROSS/BLUE SHIELD | Admitting: Nurse Practitioner

## 2014-12-28 ENCOUNTER — Other Ambulatory Visit: Payer: BLUE CROSS/BLUE SHIELD

## 2014-12-28 ENCOUNTER — Other Ambulatory Visit: Payer: Self-pay | Admitting: *Deleted

## 2014-12-28 ENCOUNTER — Encounter: Payer: Self-pay | Admitting: *Deleted

## 2014-12-28 VITALS — BP 125/74 | HR 88 | Temp 98.4°F | Resp 18 | Ht 67.0 in | Wt 170.6 lb

## 2014-12-28 DIAGNOSIS — C778 Secondary and unspecified malignant neoplasm of lymph nodes of multiple regions: Secondary | ICD-10-CM

## 2014-12-28 DIAGNOSIS — C787 Secondary malignant neoplasm of liver and intrahepatic bile duct: Secondary | ICD-10-CM

## 2014-12-28 DIAGNOSIS — C7802 Secondary malignant neoplasm of left lung: Secondary | ICD-10-CM

## 2014-12-28 DIAGNOSIS — C50411 Malignant neoplasm of upper-outer quadrant of right female breast: Secondary | ICD-10-CM

## 2014-12-28 DIAGNOSIS — C7801 Secondary malignant neoplasm of right lung: Secondary | ICD-10-CM

## 2014-12-28 DIAGNOSIS — C50919 Malignant neoplasm of unspecified site of unspecified female breast: Secondary | ICD-10-CM

## 2014-12-28 DIAGNOSIS — R8299 Other abnormal findings in urine: Secondary | ICD-10-CM

## 2014-12-28 DIAGNOSIS — C7971 Secondary malignant neoplasm of right adrenal gland: Secondary | ICD-10-CM

## 2014-12-28 DIAGNOSIS — C7931 Secondary malignant neoplasm of brain: Secondary | ICD-10-CM

## 2014-12-28 DIAGNOSIS — N390 Urinary tract infection, site not specified: Secondary | ICD-10-CM

## 2014-12-28 DIAGNOSIS — Z5111 Encounter for antineoplastic chemotherapy: Secondary | ICD-10-CM | POA: Diagnosis not present

## 2014-12-28 DIAGNOSIS — C7951 Secondary malignant neoplasm of bone: Secondary | ICD-10-CM | POA: Diagnosis not present

## 2014-12-28 DIAGNOSIS — R82998 Other abnormal findings in urine: Secondary | ICD-10-CM

## 2014-12-28 DIAGNOSIS — D709 Neutropenia, unspecified: Secondary | ICD-10-CM

## 2014-12-28 LAB — COMPREHENSIVE METABOLIC PANEL (CC13)
ALK PHOS: 69 U/L (ref 40–150)
ALT: 15 U/L (ref 0–55)
AST: 16 U/L (ref 5–34)
Albumin: 3.5 g/dL (ref 3.5–5.0)
Anion Gap: 5 mEq/L (ref 3–11)
BILIRUBIN TOTAL: 0.89 mg/dL (ref 0.20–1.20)
BUN: 9.3 mg/dL (ref 7.0–26.0)
CO2: 29 meq/L (ref 22–29)
Calcium: 8.7 mg/dL (ref 8.4–10.4)
Chloride: 108 mEq/L (ref 98–109)
Creatinine: 0.6 mg/dL (ref 0.6–1.1)
EGFR: >90 mL/min/{1.73_m2} (ref >90)
Glucose: 94 mg/dl (ref 70–140)
Potassium: 3.8 mEq/L (ref 3.5–5.1)
Sodium: 142 mEq/L (ref 136–145)
TOTAL PROTEIN: 6.1 g/dL — AB (ref 6.4–8.3)

## 2014-12-28 LAB — CBC WITH DIFFERENTIAL/PLATELET
BASO%: 1.9 % (ref 0.0–2.0)
Basophils Absolute: 0 10*3/uL (ref 0.0–0.1)
EOS%: 2.6 % (ref 0.0–7.0)
Eosinophils Absolute: 0 10*3/uL (ref 0.0–0.5)
HCT: 33.8 % — ABNORMAL LOW (ref 34.8–46.6)
HGB: 11.4 g/dL — ABNORMAL LOW (ref 11.6–15.9)
LYMPH%: 31 % (ref 14.0–49.7)
MCH: 34.8 pg — ABNORMAL HIGH (ref 25.1–34.0)
MCHC: 33.7 g/dL (ref 31.5–36.0)
MCV: 103.2 fL — ABNORMAL HIGH (ref 79.5–101.0)
MONO#: 0.1 10*3/uL (ref 0.1–0.9)
MONO%: 6.2 % (ref 0.0–14.0)
NEUT#: 1.1 10*3/uL — ABNORMAL LOW (ref 1.5–6.5)
NEUT%: 58.3 % (ref 38.4–76.8)
Platelets: 175 10*3/uL (ref 145–400)
RBC: 3.28 10*6/uL — ABNORMAL LOW (ref 3.70–5.45)
RDW: 15.5 % — AB (ref 11.2–14.5)
WBC: 1.8 10*3/uL — AB (ref 3.9–10.3)
lymph#: 0.6 10*3/uL — ABNORMAL LOW (ref 0.9–3.3)

## 2014-12-28 LAB — URINALYSIS, MICROSCOPIC - CHCC
Bilirubin (Urine): NEGATIVE
Blood: NEGATIVE
GLUCOSE UR CHCC: NEGATIVE mg/dL
Ketones: NEGATIVE mg/dL
Nitrite: NEGATIVE
PROTEIN: NEGATIVE mg/dL
RBC / HPF: NEGATIVE (ref 0–2)
SPECIFIC GRAVITY, URINE: 1.005 (ref 1.003–1.035)
Urobilinogen, UR: 0.2 mg/dL (ref 0.2–1)
pH: 7.5 (ref 4.6–8.0)

## 2014-12-28 MED ORDER — SODIUM CHLORIDE 0.9 % IJ SOLN
10.0000 mL | INTRAMUSCULAR | Status: DC | PRN
  Administered 2014-12-28 (×2): 10 mL
  Filled 2014-12-28: qty 10

## 2014-12-28 MED ORDER — PACLITAXEL PROTEIN-BOUND CHEMO INJECTION 100 MG
80.0000 mg/m2 | Freq: Once | INTRAVENOUS | Status: AC
  Administered 2014-12-28: 150 mg via INTRAVENOUS
  Filled 2014-12-28: qty 30

## 2014-12-28 MED ORDER — CIPROFLOXACIN HCL 500 MG PO TABS: 500.0000 mg | ORAL_TABLET | Freq: Two times a day (BID) | ORAL | Status: DC

## 2014-12-28 MED ORDER — PEGFILGRASTIM 6 MG/0.6ML ~~LOC~~ PSKT
6.0000 mg | PREFILLED_SYRINGE | Freq: Once | SUBCUTANEOUS | Status: AC
  Administered 2014-12-28: 6 mg via SUBCUTANEOUS
  Filled 2014-12-28: qty 0.6

## 2014-12-28 MED ORDER — HEPARIN SOD (PORK) LOCK FLUSH 100 UNIT/ML IV SOLN
500.0000 [IU] | Freq: Once | INTRAVENOUS | Status: AC | PRN
  Administered 2014-12-28: 500 [IU]
  Filled 2014-12-28: qty 5

## 2014-12-28 MED ORDER — SODIUM CHLORIDE 0.9 % IV SOLN
Freq: Once | INTRAVENOUS | Status: AC
  Administered 2014-12-28: 10:00:00 via INTRAVENOUS

## 2014-12-28 MED ORDER — SODIUM CHLORIDE 0.9 % IV SOLN
Freq: Once | INTRAVENOUS | Status: AC
  Administered 2014-12-28: 10:00:00 via INTRAVENOUS
  Filled 2014-12-28: qty 4

## 2014-12-28 MED ORDER — SODIUM CHLORIDE 0.9 % IJ SOLN
3.0000 mL | INTRAMUSCULAR | Status: DC | PRN
  Filled 2014-12-28: qty 10

## 2014-12-28 NOTE — Progress Notes (Signed)
And a Patient ID: Nicole Valentine, female   DOB: September 04, 1965, 49 y.o.   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 She had a mammogram performed that showed dense breasts bilaterally. Ultrasound of the right breast showed 2.3 x 1.9 cm area of abnormality with multiple abnormal lymph nodes. MRI of the bilateral breasts showed asymmetrical enhancement throughout the right breast consistent with multicentric disease. An ultrasound-guided biopsy performed. The known area of disease measured 1.9 x 1.9 x 2.3 cm. Multiple abnormal positive right axillary lymph nodes were noted. 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 Biopsy of the right axillary lymph node was positive for malignancy with extracapsular extension. Patient was originally seen by Dr. [name] Dr. [name] and Dr. [name]. She has elected to have a right mastectomy eventually and declined biopsies of any other areas within the breast."  She went on to receive neoadjuvant chemotherapy and attained a complete pathologic response, as documented below  [place] noted a very small right supraclavicular mass 09/25/2014, which she brought to our attention. She was having some allergy symptoms at the time so we decided to reevaluate this after a few weeks and on 10/22/2014 as the mass persisted we obtained a restaging neck CT scan, 10/28/2014. There was bilateral supraclavicular adenopathy, but also a large right pleural effusion was incidentally noted. We proceeded to right thoracentesis 10/29/2014,  and a liter of hazy yellow fluid was removed. Cytology from this procedure ([id number]) showed malignant cells consistent with adenocarcinoma, estrogen and progesterone receptor negative, HER-2 not amplified, with a signals ratio 1.12 and the number per cell being 2.75, and with an MIB-1 of 80%. I called Debra with these results and set her up for a PET scan performed 11/06/2014, which shows widespread metastatic disease involving particularly the right lung, liver, and bones, but also the left lung, right adrenal, and multiple lymph node areas.  Her subsequent history is as detailed below.  INTERVAL HISTORY: [name] returns today for follow-up of her metastatic breast cancer. Today is day 8 cycle 3 of 8 planned cycles of Abraxane, given days 1 and 8 of each 21 day cycle, with onpro support.   REVIEW OF S/YSTEMS: Miliyah denies fevers, chills, nausea or vomiting. She is chronically constipated. She has noticed for the past 5 days her urine has been darker, but still transparent. She continues to lose weight weekly. She has fatigue Wednesday through Friday on treatment week. She has shortness of breath with reclining. She denies chest pain, cough, or palpitations. She exercises using a stationary bike and walking indoors. She has no mouth sores, rashes, or neuropathy symptoms. A detailed review of systems is otherwise stable.  PAST MEDICAL HISTORY: Past Medical History  Diagnosis Date  . Complication of anesthesia     her father and uncle both had very difficult time waking up-was  something they told them may be hereditory. she will find out.  [name] Hypothyroidism   . 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 Radiation 01/21/13-03/06/13    Right Breast  . Breast cancer dx'd 04-10-12-rt    PAST  SURGICAL HISTORY: Past Surgical History  Procedure Laterality Date  . Wisdom tooth extraction    . Portacath placement  04/22/2012    Procedure: INSERTION PORT-A-CATH;  Surgeon: [name], MD;   Location: [facility];  Service: General;  Laterality: Left;  [name] Modified mastectomy Right 11/18/2012    Procedure:  RIGHT MODIFIED MASTECTOMY;  Surgeon: [name], MD;  Location: [facility];  Service: General;  Laterality: Right;  . Knee arthroscopy Right 11/26/2013  . Port-a-cath removal Left 12/10/2013    Procedure: MINOR REMOVAL PORT-A-CATH;  Surgeon: [name], MD;  Location: [facility];  Service: General;  Laterality: Left;    FAMILY HISTORY Family History  Problem Relation Age of Onset  . Lung cancer Maternal Grandfather 49    smoker  . Prostate cancer Maternal Grandfather 90  . Throat cancer Other     Great Aunt x 2  . Liver cancer Other     Maternal Great Grandmother  . Melanoma Maternal Uncle 81  . Brain cancer Cousin 11    non-malignant  the patient's parents are living, in their early 26s. The patient has 2 brothers, no sisters. The patient's mother was diagnosed with breast cancer, HER-2 positive, in 2014 in [place].  GYNECOLOGIC HISTORY:   (Updated 10/03/2013) Menarche age 80, first live birth age 52. She is GX P4. LMP January 2014. 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  The children are currently ages 100, 84, 68, and 7. Her husband, [name], is a Customer service manager.He just started a job for [employer].  They attend the [employer] DIRECTIVES: The patient's husband is her HCPOA   HEALTH MAINTENANCE:  (Updated 10/03/2013) History  Substance Use Topics  . Smoking status: Never Smoker   . Smokeless tobacco: Never Used  . Alcohol Use: No     Colonoscopy: Never  PAP: November 2013/Dr. Smith  Bone density: January 2015, [facility], normal  Lipid panel:  Not on file  Allergies  Allergen Reactions  . Almond Meal Rash  . Other Rash    LOTIONS    Current Outpatient Prescriptions   Medication Sig Dispense Refill  . cholecalciferol (VITAMIN D) 1000 UNITS tablet Take 2,000 Units by mouth 2 (two) times daily.     [name] levothyroxine (SYNTHROID, LEVOTHROID) 175 MCG tablet Take 175 mcg by mouth daily before breakfast.    . pegfilgrastim (NEULASTA) 6 MG/0.6ML injection Inject 6 mg into the skin once. Patch delivery system after chemo treatments    . ibuprofen (ADVIL,MOTRIN) 200 MG tablet Take 400 mg by mouth every 6 (six) hours as needed for headache or moderate pain.    [name] ondansetron (ZOFRAN) 8 MG tablet Take 1 tablet (8 mg total) by mouth 2 (two) times daily. Start the day after chemo for 2 days. Then take as needed for nausea or vomiting. (Patient not taking: Reported on 12/04/2014) 30 tablet 1  . prochlorperazine (COMPAZINE) 10 MG tablet Take 1 tablet (10 mg total) by mouth every 6 (six) hours as needed (Nausea or vomiting). (Patient not taking: Reported on 12/04/2014) 30 tablet 1  . saccharomyces boulardii (FLORASTOR) 250 MG capsule Take 1 capsule (250 mg total) by mouth 2 (two) times daily. (Patient not taking: Reported on 12/28/2014) 60 capsule 2   No current facility-administered medications for this visit.   Facility-Administered Medications Ordered in Other  Visits  Medication Dose Route Frequency Provider Last Rate Last Dose  . heparin lock flush 100 unit/mL  500 Units Intracatheter Once PRN Chauncey Cruel, MD      . PACLitaxel-protein bound (ABRAXANE) chemo infusion 150 mg  80 mg/m2 (Treatment Plan Actual) Intravenous Once Nicholas Lose, MD      . pegfilgrastim (NEULASTA ONPRO KIT) injection 6 mg  6 mg Subcutaneous Once Chauncey Cruel, MD      . sodium chloride 0.9 % injection 10 mL  10 mL Intracatheter PRN Chauncey Cruel, MD   10 mL at 12/28/14 0848  . sodium chloride 0.9 % injection 3 mL  3 mL Intravenous PRN Chauncey Cruel, MD          OBJECTIVE: Middle-aged white woman who appears well Filed Vitals:   12/28/14 0848  BP: 125/74  Pulse: 88  Temp: 98.4 F  (36.9 C)  Resp: 18     Body mass index is 26.71 kg/(m^2).    ECOG FS:0 - Asymptomatic Filed Weights   12/28/14 0848  Weight: 170 lb 9.6 oz (77.384 kg)   Skin: warm, dry  HEENT: sclerae anicteric, conjunctivae pink, oropharynx clear. No thrush or mucositis.  Lymph Nodes: No cervical or supraclavicular lymphadenopathy  Lungs: clear to auscultation bilaterally, no rales, wheezes, or rhonci  Heart: regular rate and rhythm  Abdomen: round, soft, non tender, positive bowel sounds  Musculoskeletal: No focal spinal tenderness, no peripheral edema  Neuro: non focal, well oriented, positive affect  Breasts: deferred  LAB RESULTS:  Lab Results  Component Value Date   WBC 1.8* 12/28/2014   NEUTROABS 1.1* 12/28/2014   HGB 11.4* 12/28/2014   HCT 33.8* 12/28/2014   MCV 103.2* 12/28/2014   PLT 175 12/28/2014      Chemistry      Component Value Date/Time   NA 142 12/28/2014 0817   NA 138 11/19/2014 0510   K 3.8 12/28/2014 0817   K 3.2* 11/19/2014 0510   CL 104 11/19/2014 0510   CL 101 10/18/2012 0859   CO2 29 12/28/2014 0817   CO2 27 11/19/2014 0510   BUN 9.3 12/28/2014 0817   BUN 7 11/19/2014 0510   CREATININE 0.6 12/28/2014 0817   CREATININE 0.59 11/19/2014 0510      Component Value Date/Time   CALCIUM 8.7 12/28/2014 0817   CALCIUM 7.4* 11/19/2014 0510   ALKPHOS 69 12/28/2014 0817   ALKPHOS 94 11/17/2014 2205   AST 16 12/28/2014 0817   AST 22 11/17/2014 2205   ALT 15 12/28/2014 0817   ALT 20 11/17/2014 2205   BILITOT 0.89 12/28/2014 0817   BILITOT 0.6 11/17/2014 2205       STUDIES: Mr Jeri Cos Wo Contrast  01-Jan-2015   CLINICAL DATA:  49 year old female with breast 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 Targeting for stereotactic radio surgery requested. Subsequent encounter.  EXAM: MRI HEAD WITHOUT AND WITH CONTRAST  TECHNIQUE: Multiplanar, multiecho pulse sequences of the brain and surrounding structures were obtained without and with intravenous contrast.  CONTRAST:  35m  MULTIHANCE GADOBENATE DIMEGLUMINE 529 MG/ML IV SOLN  COMPARISON:  11/16/2014 brain MRI, and earlier  FINDINGS: Both the small 3-4 mm medial right cerebellar tonsil metastasis an the more central right cerebellar hemisphere metastasis have mildly decreased in size and conspicuity since 11/16/2014. These are annotated on series 10.  I also suspect a subtle punctate right cerebellar hemisphere metastasis near the previously described lesion (series 10, image 52). Wrap artifact along the anterior aspect of  the left cerebellum (e.g. Image 53).  2-3 mm small midline superior cerebellar metastasis on image 60.  No brainstem or supratentorial brain metastasis is identified no dural thickening identified. Incidental small anterior right frontal lobe developmental venous anomaly (series 10, image 89).  No cerebellar edema or mass effect. No restricted diffusion or evidence of acute infarction. No ventriculomegaly. Normal basilar cisterns. No acute intracranial hemorrhage identified. Normal for age gray and white matter signal. Negative pituitary and cervicomedullary junction. Grossly negative visualized cervical spine. Orbits soft tissues appear normal. Negative scalp soft tissues. Negative paranasal sinuses. Major intracranial vascular flow voids are within normal limits.  Cystic structure or fluid at the right petrous apex (series 6, image 8) has mildly heterogeneous intrinsic T1 signal and is not definitely enhancing. No associated diffusion abnormality. Burtis Junes this is benign and postinflammatory.  Decreased T1 bone marrow signal throughout the visible skeleton, nonspecific. No destructive osseous lesion identified.  IMPRESSION: 1. Four small (3-4 mm max) cerebellar metastases. The 2 identified in June appear slightly regressed. No associated cerebellar edema or mass effect. 2. Decreased T1 bone marrow signal is nonspecific, without strong evidence of osseous metastatic disease at this time.   Electronically Signed   By: Genevie Ann M.D.   On: 12/03/2014 12:52    ASSESSMENT: 49 y.o. BRCA negative [place] woman with triple-negative stage IV breast cancer  (1) an status post right breast upper outer quadrant and right axillary lymph node biopsy 04/04/2012, both positive for an invasive ductal carcinoma, grade 3, triple negative, with an MIB-1 of 25%  (2) Treated neoadjuvantly with  (a) fluorouracil, cyclophosphamide, and epirubicin (at 100 mg/M2) x4 completed 06/07/2012  (b) docetaxel (75 mg/M2) for one dose, 06/21/2012, poorly tolerated  (c) carboplatin and gemcitabine given every 21 days for 6 cycles completed 10/11/2012  (3) status post right modified radical mastectomy 11/18/2012 showing a complete pathologic response--all 16 lymph nodes were benign  (a) no plans for reconstructon  (4) adjuvant radiation therapy completed 03/06/2013  METASTATIC DISEASE June 2016 (5) 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, 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  (6) staging PET scan 11/06/2014 showed significant disease in the middle and lower lobes of the Right lung, Right effusion, multiple liver and bone lesions, as well as left lung nodules, a Right adrenal nodule, and widespread hypermetabolic adenopathy  (a) brain MRI 11/16/2014 showed 2 cerebellar lesions; SRS pending  (7) abraxane day 1 and day 8 of each 21 day cycle started 11/09/2014  (8) zolendronate started 11/16/2014, to be repeated every 12 weeks  (9) Foundation 1 study sent  11/06/2014: patient's sister in law Garvin Fila following [phone])  (a) 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[place], NTRK1, MYC, ARID1A and LYN, none w approved therapies  (b) pazopanib, ponatinib or crizotinib suggested as possible off-protocol options  PLAN: Cher looks and feels well today. The labs were reviewed in detail and her [place] is down to 1.1. I consulted with Dr. Lindi Adie and he suggested decreasing the dose to 34m/m2 this one time, and she will receive  neulasta as planned tomorrow. She will resume the 1024mm2 dose with her next cycle.   Her kidney function is normal, so I am having her collect a urine sample to see if there is any microscopic result that would cause her urine to be dark. She will continue to hydrate well.  JeJenavias a PET scan planned for next week. [facility] Ma[facility] 8/17. She understands and  agrees with this plan. She knows the goal of treatment in her case is control. She has been encouraged to call with any issues that might arise before her next visit here.   Laurie Panda, NP   12/28/2014 10:31 AM

## 2014-12-28 NOTE — Progress Notes (Signed)
OK to treat with ANC 1.1, per Gentry Fitz, NP. Pt's dose will be reduced.

## 2014-12-28 NOTE — Patient Instructions (Addendum)
[facility] Discharge Instructions for Patients Receiving Chemotherapy  Today you received the following chemotherapy agents: Abraxane   To help prevent nausea and vomiting after your treatment, we encourage you to take your nausea medication as directed.    If you develop nausea and vomiting that is not controlled by your nausea medication, call the clinic.   BELOW ARE SYMPTOMS THAT SHOULD BE REPORTED IMMEDIATELY:  *FEVER GREATER THAN 100.5 F  *CHILLS WITH OR WITHOUT FEVER  NAUSEA AND VOMITING THAT IS NOT CONTROLLED WITH YOUR NAUSEA MEDICATION  *UNUSUAL SHORTNESS OF BREATH  *UNUSUAL BRUISING OR BLEEDING  TENDERNESS IN MOUTH AND THROAT WITH OR WITHOUT PRESENCE OF ULCERS  *URINARY PROBLEMS  *BOWEL PROBLEMS  UNUSUAL RASH Items with * indicate a potential emergency and should be followed up as soon as possible.  Feel free to call the clinic you have any questions or concerns. The clinic phone number is [phone].  Please show the [place] at check-in to the Emergency Department and triage nurse.   Pegfilgrastim injection What is this medicine? PEGFILGRASTIM (peg fil GRA stim) is a long-acting granulocyte colony-stimulating factor that stimulates the growth of neutrophils, a type of white blood cell important in the body's fight against infection. It is used to reduce the incidence of fever and infection in patients with certain types of cancer who are receiving chemotherapy that affects the bone marrow. This medicine may be used for other purposes; ask your health care provider or pharmacist if you have questions. COMMON BRAND NAME(S): Neulasta What should I tell my health care provider before I take this medicine? They need to know if you have any of these conditions: -latex allergy -ongoing radiation therapy -sickle cell disease -skin reactions to acrylic adhesives (On-Body Injector only) -an unusual or allergic reaction to pegfilgrastim,  filgrastim, other medicines, foods, dyes, or preservatives -pregnant or trying to get pregnant -breast-feeding How should I use this medicine? This medicine is for injection under the skin. If you get this medicine at home, you will be taught how to prepare and give the pre-filled syringe or how to use the On-body Injector. Refer to the patient Instructions for Use for detailed instructions. Use exactly as directed. Take your medicine at regular intervals. Do not take your medicine more often than directed. It is important that you put your used needles and syringes in a special sharps container. Do not put them in a trash can. If you do not have a sharps container, call your pharmacist or healthcare provider to get one. Talk to your pediatrician regarding the use of this medicine in children. Special care may be needed. Overdosage: If you think you have taken too much of this medicine contact a poison control center or emergency room at once. NOTE: This medicine is only for you. Do not share this medicine with others. What if I miss a dose? It is important not to miss your dose. Call your doctor or health care professional if you miss your dose. If you miss a dose due to an On-body Injector failure or leakage, a new dose should be administered as soon as possible using a single prefilled syringe for manual use. What may interact with this medicine? Interactions have not been studied. Give your health care provider a list of all the medicines, herbs, non-prescription drugs, or dietary supplements you use. Also tell them if you smoke, drink alcohol, or use illegal drugs. Some items may interact with your medicine. This list may not  describe all possible interactions. Give your health care provider a list of all the medicines, herbs, non-prescription drugs, or dietary supplements you use. Also tell them if you smoke, drink alcohol, or use illegal drugs. Some items may interact with your medicine. What  should I watch for while using this medicine? You may need blood work done while you are taking this medicine. If you are going to need a MRI, CT scan, or other procedure, tell your doctor that you are using this medicine (On-Body Injector only). What side effects may I notice from receiving this medicine? Side effects that you should report to your doctor or health care professional as soon as possible: -allergic reactions like skin rash, itching or hives, swelling of the face, lips, or tongue -dizziness -fever -pain, redness, or irritation at site where injected -pinpoint red spots on the skin -shortness of breath or breathing problems -stomach or side pain, or pain at the shoulder -swelling -tiredness -trouble passing urine Side effects that usually do not require medical attention (report to your doctor or health care professional if they continue or are bothersome): -bone pain -muscle pain This list may not describe all possible side effects. Call your doctor for medical advice about side effects. You may report side effects to FDA at 1-800-FDA-1088. Where should I keep my medicine? Keep out of the reach of children. Store pre-filled syringes in a refrigerator between 2 and 8 degrees C (36 and 46 degrees F). Do not freeze. Keep in carton to protect from light. Throw away this medicine if it is left out of the refrigerator for more than 48 hours. Throw away any unused medicine after the expiration date. NOTE: This sheet is a summary. It may not cover all possible information. If you have questions about this medicine, talk to your doctor, pharmacist, or health care provider.  2015, Elsevier/Gold Standard. (2013-08-07 16:14:05)

## 2014-12-28 NOTE — Progress Notes (Signed)
Pt presented new insurance card which was scanned in chart. Pt made pymt of $50 for copay via [employer]. Receipt given.

## 2014-12-29 ENCOUNTER — Ambulatory Visit: Payer: BLUE CROSS/BLUE SHIELD

## 2014-12-30 NOTE — Progress Notes (Signed)
  Radiation Oncology         [phone] ________________________________  Name: Nicole Valentine MRN: [id number]  Date: 12/11/2014  DOB: Jan 27, 1966  End of Treatment Note   C79.31 Brain metastases / Breast cancer  Indication for treatment:  palliative       Radiation treatment dates:   12/11/2014  Site/dose/Beams:   Right Cerebellar Tonsil 77mm target was treated using 4 Circular Arcs to a prescription dose of 20 Gy.  ExacTrac Snap verification was performed for each couch angle.  Right Cerebellar Midline 26mm target was treated using 3 Circular Arcs to a prescription dose of 20 Gy.  ExacTrac Snap verification was performed for each couch angle.  Right Cerebellar Hemis 91mm target was treated using 4 Circular Arcs to a prescription dose of 20 Gy.  ExacTrac Snap verification was performed for each couch angle.  Right Inf Cerebellar Hemis 58mm target was treated using 4 Circular Arcs to a prescription dose of 20 Gy.  ExacTrac Snap verification was performed for each couch angle.  Energy:   All fields treated with 6X FFF photons  Narrative: The patient tolerated radiation treatment relatively well.      Plan: The patient has completed radiation treatment. The patient will return to radiation oncology clinic for routine followup in one month. I advised them to call or return sooner if they have any questions or concerns related to their recovery or treatment.  -----------------------------------  Eppie Gibson, MD

## 2015-01-04 ENCOUNTER — Other Ambulatory Visit: Payer: Self-pay | Admitting: Oncology

## 2015-01-04 ENCOUNTER — Other Ambulatory Visit: Payer: Self-pay | Admitting: *Deleted

## 2015-01-04 ENCOUNTER — Telehealth: Payer: Self-pay | Admitting: *Deleted

## 2015-01-04 NOTE — Progress Notes (Unsigned)
And a Patient ID: Nicole Valentine, female   DOB: 1966/05/10, 49 y.o.   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 She had a mammogram performed that showed dense breasts bilaterally. Ultrasound of the right breast showed 2.3 x 1.9 cm area of abnormality with multiple abnormal lymph nodes. MRI of the bilateral breasts showed asymmetrical enhancement throughout the right breast consistent with multicentric disease. An ultrasound-guided biopsy performed. The known area of disease measured 1.9 x 1.9 x 2.3 cm. Multiple abnormal positive right axillary lymph nodes were noted. 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 Biopsy of the right axillary lymph node was positive for malignancy with extracapsular extension. Patient was originally seen by Dr. [name] Dr. [name] and Dr. Pablo Valentine. She has elected to have a right mastectomy eventually and declined biopsies of any other areas within the breast."  She went on to receive neoadjuvant chemotherapy and attained a complete pathologic response, as documented below  [place] noted a very small right supraclavicular mass 09/25/2014, which she brought to our attention. She was having some allergy symptoms at the time so we decided to reevaluate this after a few weeks and on 10/22/2014 as the mass persisted we obtained a restaging neck CT scan, 10/28/2014. There was bilateral supraclavicular adenopathy, but also a large right pleural effusion was incidentally noted. We proceeded to right thoracentesis 10/29/2014,  and a liter of hazy yellow fluid was removed. Cytology from this procedure ([id number]) showed malignant cells consistent with adenocarcinoma, estrogen and progesterone receptor negative, HER-2 not amplified, with a signals ratio 1.12 and the number per cell being 2.75, and with an MIB-1 of 80%. I called [facility] with these results and set her up for a PET scan performed 11/06/2014, which shows widespread metastatic disease involving particularly the right lung, liver, and bones, but also the left lung, right adrenal, and multiple lymph node areas.  Her subsequent history is as detailed below.  INTERVAL HISTORY: [name] returns today for follow-up of her metastatic breast cancer. Today is day 8 cycle 3 of 8 planned cycles of Abraxane, given days 1 and 8 of each 21 day cycle, with onpro support.   REVIEW OF S/YSTEMS: Amare denies fevers, chills, nausea or vomiting. She is chronically constipated. She has noticed for the past 5 days her urine has been darker, but still transparent. She continues to lose weight weekly. She has fatigue Wednesday through Friday on treatment week. She has shortness of breath with reclining. She denies chest pain, cough, or palpitations. She exercises using a stationary bike and walking indoors. She has no mouth sores, rashes, or neuropathy symptoms. A detailed review of systems is otherwise stable.  PAST MEDICAL HISTORY: Past Medical History  Diagnosis Date  . Complication of anesthesia     her father and uncle both had very difficult time waking up-was  something they told them may be hereditory. she will find out.  Nicole Valentine Hypothyroidism   . 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 Radiation 01/21/13-03/06/13    Right Breast  . Breast cancer dx'd 04-10-12-rt    PAST  SURGICAL HISTORY: Past Surgical History  Procedure Laterality Date  . Wisdom tooth extraction    . Portacath placement  04/22/2012    Procedure: INSERTION PORT-A-CATH;  Surgeon: Nicole Lasso, MD;   Location: [facility];  Service: General;  Laterality: Left;  Nicole Valentine Modified mastectomy Right 11/18/2012    Procedure:  RIGHT MODIFIED MASTECTOMY;  Surgeon: Nicole Lasso, MD;  Location: [facility];  Service: General;  Laterality: Right;  . Knee arthroscopy Right 11/26/2013  . Port-a-cath removal Left 12/10/2013    Procedure: MINOR REMOVAL PORT-A-CATH;  Surgeon: Nicole Hector, MD;  Location: [facility];  Service: General;  Laterality: Left;    FAMILY HISTORY Family History  Problem Relation Age of Onset  . Lung cancer Maternal Grandfather 24    smoker  . Prostate cancer Maternal Grandfather 90  . Throat cancer Other     Great Aunt x 2  . Liver cancer Other     Maternal Great Grandmother  . Melanoma Maternal Uncle 81  . Brain cancer Cousin 11    non-malignant  the patient's parents are living, in their early 64s. The patient has 2 brothers, no sisters. The patient's mother was diagnosed with breast cancer, HER-2 positive, in 2014 in [place].  GYNECOLOGIC HISTORY:   (Updated 10/03/2013) Menarche age 76, first live birth age 31. She is GX P4. LMP January 2014. 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  The children are currently ages 8, 72, 71, and 56. Her husband, Nicole Valentine, is a Customer service manager.He just started a job for [employer].  They attend the [employer] DIRECTIVES: The patient's husband is her HCPOA   HEALTH MAINTENANCE:  (Updated 10/03/2013) Social History  Substance Use Topics  . Smoking status: Never Smoker   . Smokeless tobacco: Never Used  . Alcohol Use: No     Colonoscopy: Never  PAP: November 2013/Dr. Smith  Bone density: January 2015, [facility], normal  Lipid panel:  Not on file  Allergies  Allergen Reactions  . Almond Meal Rash  . Other Rash    LOTIONS    Current Outpatient Prescriptions   Medication Sig Dispense Refill  . cholecalciferol (VITAMIN D) 1000 UNITS tablet Take 2,000 Units by mouth 2 (two) times daily.     . ciprofloxacin (CIPRO) 500 MG tablet Take 1 tablet (500 mg total) by mouth 2 (two) times daily. 10 tablet 0  . ibuprofen (ADVIL,MOTRIN) 200 MG tablet Take 400 mg by mouth every 6 (six) hours as needed for headache or moderate pain.    Nicole Valentine levothyroxine (SYNTHROID, LEVOTHROID) 175 MCG tablet Take 175 mcg by mouth daily before breakfast.    . ondansetron (ZOFRAN) 8 MG tablet Take 1 tablet (8 mg total) by mouth 2 (two) times daily. Start the day after chemo for 2 days. Then take as needed for nausea or vomiting. (Patient not taking: Reported on 12/04/2014) 30 tablet 1  . pegfilgrastim (NEULASTA) 6 MG/0.6ML injection Inject 6 mg into the skin once. Patch delivery system after chemo treatments    . prochlorperazine (COMPAZINE) 10 MG tablet Take 1 tablet (10 mg total) by mouth every 6 (six) hours as needed (Nausea or vomiting). (Patient not taking: Reported on 12/04/2014) 30 tablet 1  . saccharomyces boulardii (FLORASTOR) 250 MG capsule Take 1 capsule (250 mg total) by mouth 2 (two) times daily. (Patient not  taking: Reported on 12/28/2014) 60 capsule 2   No current facility-administered medications for this visit.   Facility-Administered Medications Ordered in Other Visits  Medication Dose Route Frequency Provider Last Rate Last Dose  . sodium chloride 0.9 % injection 10 mL  10 mL Intracatheter PRN Chauncey Cruel, MD   10 mL at 12/28/14 1141  . sodium chloride 0.9 % injection 3 mL  3 mL Intravenous PRN Chauncey Cruel, MD          OBJECTIVE: Middle-aged white woman who appears well There were no vitals filed for this visit.   There is no weight on file to calculate BMI.    ECOG FS:0 - Asymptomatic There were no vitals filed for this visit. Skin: warm, dry  HEENT: sclerae anicteric, conjunctivae pink, oropharynx clear. No thrush or mucositis.  Lymph Nodes: No cervical  or supraclavicular lymphadenopathy  Lungs: clear to auscultation bilaterally, no rales, wheezes, or rhonci  Heart: regular rate and rhythm  Abdomen: round, soft, non tender, positive bowel sounds  Musculoskeletal: No focal spinal tenderness, no peripheral edema  Neuro: non focal, well oriented, positive affect  Breasts: deferred  LAB RESULTS:  Lab Results  Component Value Date   WBC 1.8* 12/28/2014   NEUTROABS 1.1* 12/28/2014   HGB 11.4* 12/28/2014   HCT 33.8* 12/28/2014   MCV 103.2* 12/28/2014   PLT 175 12/28/2014      Chemistry      Component Value Date/Time   NA 142 12/28/2014 0817   NA 138 11/19/2014 0510   K 3.8 12/28/2014 0817   K 3.2* 11/19/2014 0510   CL 104 11/19/2014 0510   CL 101 10/18/2012 0859   CO2 29 12/28/2014 0817   CO2 27 11/19/2014 0510   BUN 9.3 12/28/2014 0817   BUN 7 11/19/2014 0510   CREATININE 0.6 12/28/2014 0817   CREATININE 0.59 11/19/2014 0510      Component Value Date/Time   CALCIUM 8.7 12/28/2014 0817   CALCIUM 7.4* 11/19/2014 0510   ALKPHOS 69 12/28/2014 0817   ALKPHOS 94 11/17/2014 2205   AST 16 12/28/2014 0817   AST 22 11/17/2014 2205   ALT 15 12/28/2014 0817   ALT 20 11/17/2014 2205   BILITOT 0.89 12/28/2014 0817   BILITOT 0.6 11/17/2014 2205       STUDIES: No results found.  ASSESSMENT: 49 y.o. BRCA negative [place] woman with triple-negative stage IV breast cancer  (1) an status post right breast upper outer quadrant and right axillary lymph node biopsy 04/04/2012, both positive for an invasive ductal carcinoma, grade 3, triple negative, with an MIB-1 of 25%  (2) Treated neoadjuvantly with  (a) fluorouracil, cyclophosphamide, and epirubicin (at 100 mg/M2) x4 completed 06/07/2012  (b) docetaxel (75 mg/M2) for one dose, 06/21/2012, poorly tolerated  (c) carboplatin and gemcitabine given every 21 days for 6 cycles completed 10/11/2012  (3) status post right modified radical mastectomy 11/18/2012 showing a complete  pathologic response--all 16 lymph nodes were benign  (a) no plans for reconstructon  (4) adjuvant radiation therapy completed 03/06/2013  METASTATIC DISEASE June 2016 (5) 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, 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  (6) staging PET scan 11/06/2014 showed significant disease in the middle and lower lobes of the Right lung, Right effusion, multiple liver and bone lesions, as well as left lung nodules, a Right adrenal nodule, and widespread hypermetabolic adenopathy  (a) brain MRI 11/16/2014 showed 2 cerebellar lesions; SRS pending  (7) abraxane day 1 and day 8  of each 21 day cycle started 11/09/2014  (8) zolendronate started 11/16/2014, to be repeated every 12 weeks  (9) Foundation 1 study sent  11/06/2014: patient's sister in law Garvin Fila following [phone])  (a) 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[place], NTRK1, MYC, ARID1A and LYN, none w approved therapies  (b) pazopanib, ponatinib or crizotinib suggested as possible off-protocol options  PLAN: Danyah looks and feels well today. The labs were reviewed in detail and her [place] is down to 1.1. I consulted with Dr. Lindi Adie and he suggested decreasing the dose to 68m/m2 this one time, and she will receive neulasta as planned tomorrow. She will resume the 1073mm2 dose with her next cycle.   Her kidney function is normal, so I am having her collect a urine sample to see if there is any microscopic result that would cause her urine to be dark. She will continue to hydrate well.  JeS[place]enas a PET scan planned for next week. [facility]im[facility] 8/17. She understands and agrees with this plan. She knows the goal of treatment in her case is control. She has been encouraged to call with any issues that might arise before her next visit here.   MAChauncey CruelMD   01/04/2015 10:27 AM

## 2015-01-04 NOTE — Telephone Encounter (Signed)
Per MD to MD peer review authorization obtained for PET scan-  Authorization number - [id number] - authorization expires 02/02/2015.  Called and informed [facility].

## 2015-01-05 ENCOUNTER — Encounter (HOSPITAL_COMMUNITY): Payer: Self-pay

## 2015-01-05 ENCOUNTER — Ambulatory Visit (HOSPITAL_COMMUNITY)
Admission: RE | Admit: 2015-01-05 | Discharge: 2015-01-05 | Disposition: A | Discharge status: Home / Self Care | Payer: BLUE CROSS/BLUE SHIELD | Source: Ambulatory Visit | Attending: Oncology | Admitting: Oncology

## 2015-01-05 ENCOUNTER — Other Ambulatory Visit: Payer: Self-pay | Admitting: Oncology

## 2015-01-05 DIAGNOSIS — C50919 Malignant neoplasm of unspecified site of unspecified female breast: Secondary | ICD-10-CM

## 2015-01-05 DIAGNOSIS — Z79899 Other long term (current) drug therapy: Secondary | ICD-10-CM | POA: Diagnosis not present

## 2015-01-05 DIAGNOSIS — C50411 Malignant neoplasm of upper-outer quadrant of right female breast: Secondary | ICD-10-CM | POA: Insufficient documentation

## 2015-01-05 DIAGNOSIS — T80219A Unspecified infection due to central venous catheter, initial encounter: Secondary | ICD-10-CM | POA: Diagnosis not present

## 2015-01-05 DIAGNOSIS — Z923 Personal history of irradiation: Secondary | ICD-10-CM | POA: Diagnosis not present

## 2015-01-05 DIAGNOSIS — E039 Hypothyroidism, unspecified: Secondary | ICD-10-CM | POA: Insufficient documentation

## 2015-01-05 DIAGNOSIS — Z452 Encounter for adjustment and management of vascular access device: Secondary | ICD-10-CM | POA: Insufficient documentation

## 2015-01-05 DIAGNOSIS — J90 Pleural effusion, not elsewhere classified: Secondary | ICD-10-CM | POA: Insufficient documentation

## 2015-01-05 DIAGNOSIS — C787 Secondary malignant neoplasm of liver and intrahepatic bile duct: Secondary | ICD-10-CM | POA: Insufficient documentation

## 2015-01-05 DIAGNOSIS — C7951 Secondary malignant neoplasm of bone: Secondary | ICD-10-CM

## 2015-01-05 DIAGNOSIS — Z9011 Acquired absence of right breast and nipple: Secondary | ICD-10-CM | POA: Diagnosis not present

## 2015-01-05 LAB — GLUCOSE, CAPILLARY: GLUCOSE-CAPILLARY: 94 mg/dL (ref 65–99)

## 2015-01-05 MED ORDER — FLUDEOXYGLUCOSE F - 18 (FDG) INJECTION
8.4700 | Freq: Once | INTRAVENOUS | Status: DC | PRN
  Administered 2015-01-05: 8.47 via INTRAVENOUS
  Filled 2015-01-05: qty 8.47

## 2015-01-06 ENCOUNTER — Telehealth: Payer: Self-pay

## 2015-01-06 ENCOUNTER — Ambulatory Visit (HOSPITAL_BASED_OUTPATIENT_CLINIC_OR_DEPARTMENT_OTHER): Payer: BLUE CROSS/BLUE SHIELD | Admitting: Oncology

## 2015-01-06 ENCOUNTER — Other Ambulatory Visit: Payer: Self-pay | Admitting: *Deleted

## 2015-01-06 VITALS — BP 139/70 | HR 89 | Temp 98.4°F | Resp 18 | Ht 67.0 in | Wt 171.7 lb

## 2015-01-06 DIAGNOSIS — C50411 Malignant neoplasm of upper-outer quadrant of right female breast: Secondary | ICD-10-CM | POA: Diagnosis not present

## 2015-01-06 DIAGNOSIS — C7951 Secondary malignant neoplasm of bone: Secondary | ICD-10-CM | POA: Diagnosis not present

## 2015-01-06 DIAGNOSIS — C7931 Secondary malignant neoplasm of brain: Secondary | ICD-10-CM | POA: Diagnosis not present

## 2015-01-06 DIAGNOSIS — C7971 Secondary malignant neoplasm of right adrenal gland: Secondary | ICD-10-CM

## 2015-01-06 DIAGNOSIS — Z171 Estrogen receptor negative status [ER-]: Secondary | ICD-10-CM

## 2015-01-06 DIAGNOSIS — L539 Erythematous condition, unspecified: Secondary | ICD-10-CM

## 2015-01-06 DIAGNOSIS — C7802 Secondary malignant neoplasm of left lung: Secondary | ICD-10-CM

## 2015-01-06 DIAGNOSIS — C7801 Secondary malignant neoplasm of right lung: Secondary | ICD-10-CM | POA: Diagnosis not present

## 2015-01-06 DIAGNOSIS — C50919 Malignant neoplasm of unspecified site of unspecified female breast: Secondary | ICD-10-CM

## 2015-01-06 DIAGNOSIS — C778 Secondary and unspecified malignant neoplasm of lymph nodes of multiple regions: Secondary | ICD-10-CM

## 2015-01-06 DIAGNOSIS — C787 Secondary malignant neoplasm of liver and intrahepatic bile duct: Secondary | ICD-10-CM

## 2015-01-06 MED ORDER — SULFAMETHOXAZOLE-TRIMETHOPRIM 800-160 MG PO TABS: 1.0000 | ORAL_TABLET | Freq: Two times a day (BID) | ORAL | Status: DC

## 2015-01-06 MED ORDER — DOXYCYCLINE HYCLATE 100 MG PO TABS: 100.0000 mg | ORAL_TABLET | Freq: Two times a day (BID) | ORAL | Status: DC

## 2015-01-06 MED ORDER — AMOXICILLIN-POT CLAVULANATE 875-125 MG PO TABS: 1.0000 | ORAL_TABLET | Freq: Two times a day (BID) | ORAL | Status: DC

## 2015-01-06 NOTE — Telephone Encounter (Signed)
Vm from pt stating her portside incision is red and swollen, pt states she sees Dr. [name] today at 430. VM forwarded to Dr. Starleen Arms nurse.

## 2015-01-06 NOTE — Telephone Encounter (Signed)
Returned pt call re: port site looking red and swollen.  Advised pt to keep her appt with Dr. [name] and he can look at it when she gets here.  Pt voiced understanding.

## 2015-01-06 NOTE — Addendum Note (Signed)
Addended by: Prentiss Bells on: 01/06/2015 05:48 PM   Modules accepted: Orders, Medications

## 2015-01-06 NOTE — Progress Notes (Signed)
And a Patient ID: Nicole Valentine, female   DOB: [birth year], 49 y.o.   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 She had a mammogram performed that showed dense breasts bilaterally. Ultrasound of the right breast showed 2.3 x 1.9 cm area of abnormality with multiple abnormal lymph nodes. MRI of the bilateral breasts showed asymmetrical enhancement throughout the right breast consistent with multicentric disease. An ultrasound-guided biopsy performed. The known area of disease measured 1.9 x 1.9 x 2.3 cm. Multiple abnormal positive right axillary lymph nodes were noted. 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 Biopsy of the right axillary lymph node was positive for malignancy with extracapsular extension. Patient was originally seen by Dr. [name] Dr. [name] and Dr. [name]. She has elected to have a right mastectomy eventually and declined biopsies of any other areas within the breast."  She went on to receive neoadjuvant chemotherapy and attained a complete pathologic response, as documented below  Nicole Valentine noted a very small right supraclavicular mass 09/25/2014, which she brought to our attention. She was having some allergy symptoms at the time so we decided to reevaluate this after a few weeks and on 10/22/2014 as the mass persisted we obtained a restaging neck CT scan, 10/28/2014. There was bilateral supraclavicular adenopathy, but also a large right pleural effusion was incidentally noted. We proceeded to right  thoracentesis 10/29/2014, and a liter of hazy yellow fluid was removed. Cytology from this procedure ([id number]) showed malignant cells consistent with adenocarcinoma, estrogen and progesterone receptor negative, HER-2 not amplified, with a signals ratio 1.12 and the number per cell being 2.75, and with an MIB-1 of 80%. I called [facility] with these results and set her up for a PET scan performed 11/06/2014, which shows widespread metastatic disease involving particularly the right lung, liver, and bones, but also the left lung, right adrenal, and multiple lymph node areas.  Her subsequent history is as detailed below.  INTERVAL HISTORY: Nicole Valentine returns today for follow-up of her metastatic breast cancer. Today is day 17 cycle 3 of 8 planned cycles of Abraxane, given days 1 and 8 of each 21 day cycle, with onpro support. Since her last visit here she had a PET scan 01/05/2015, which shows near complete resolution on of the previously bright areas of uptake, with of course some residual disease still.  REVIEW OF S/YSTEMS: Nicole Valentine continues to tolerate the Abraxane well, with no nausea or vomiting, and with no peripheral neuropathy symptoms. She does feel fatigue usually for 2-4 days after treatment. Her children are going to be going to [facility], so she will have a little bit more time to herself. The only area of concern is some erythema which has developed over her port area. This has become more noticeable today and then she has a new red area in the left neck(where the port drains); there is no erythema in between the 2 sites. She has also noted some small lesions in the upper posterior soft palate. Aside from that a detailed review of systems today was negative.  PAST MEDICAL HISTORY: Past Medical  History  Diagnosis Date  . Complication of anesthesia     her father and uncle both had very difficult time waking up-was  something they told them may be hereditory. she will find out.  [name]  Hypothyroidism   . 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 Radiation 01/21/13-03/06/13    Right Breast  . Breast cancer dx'd 04-10-12-rt    PAST SURGICAL HISTORY: Past Surgical History  Procedure Laterality Date  . Wisdom tooth extraction    . Portacath placement  04/22/2012    Procedure: INSERTION PORT-A-CATH;  Surgeon: [name], MD;  Location: [place];  Service: General;  Laterality: Left;  [name] Modified mastectomy Right 11/18/2012    Procedure:  RIGHT MODIFIED MASTECTOMY;  Surgeon: [name], MD;  Location: [place];  Service: General;  Laterality: Right;  . Knee arthroscopy Right 11/26/2013  . Port-a-cath removal Left 12/10/2013    Procedure: MINOR REMOVAL PORT-A-CATH;  Surgeon: [name], MD;  Location: [place];  Service: General;  Laterality: Left;    FAMILY HISTORY Family History  Problem Relation Age of Onset  . Lung cancer Maternal Grandfather 80    smoker  . Prostate cancer Maternal Grandfather 90  . Throat cancer Other     Great Aunt x 2  . Liver cancer Other     Maternal Great Grandmother  . Melanoma Maternal Uncle 81  . Brain cancer Cousin 11    non-malignant  the patient's parents are living, in their early 61s. The patient has 2 brothers, no sisters. The patient's mother was diagnosed with breast cancer, HER-2 positive, in 2014 in [place].  GYNECOLOGIC HISTORY:   (Updated 10/03/2013) Menarche age 48, first live birth age 36. She is GX P4. LMP January 2014. 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  The children are currently ages 54, 53, 41, and 65. Her husband, Nicole Valentine, is a Customer service manager.He just started a job for [employer].  They attend the [employer] DIRECTIVES: The patient's husband is her HCPOA   HEALTH MAINTENANCE:  (Updated 10/03/2013) Social History  Substance  Use Topics  . Smoking status: Never Smoker   . Smokeless tobacco: Never Used  . Alcohol Use: No     Colonoscopy: Never  PAP: November 2013/Dr. Smith  Bone density: January 2015, [facility], normal  Lipid panel:  Not on file  Allergies  Allergen Reactions  . Almond Meal Rash  . Other Rash    LOTIONS    Current Outpatient Prescriptions  Medication Sig Dispense Refill  . cholecalciferol (VITAMIN D) 1000 UNITS tablet Take 2,000 Units by mouth 2 (two) times daily.     . ciprofloxacin (CIPRO) 500 MG tablet Take 1 tablet (500 mg total) by mouth 2 (two) times daily. 10 tablet 0  . ibuprofen (ADVIL,MOTRIN) 200 MG tablet Take 400 mg by mouth every 6 (six) hours as needed for headache or moderate pain.    [name] levothyroxine (SYNTHROID, LEVOTHROID) 175 MCG tablet Take 175 mcg by mouth daily before breakfast.    . ondansetron (ZOFRAN) 8 MG tablet Take 1 tablet (8 mg total) by mouth 2 (two) times daily. Start the day after chemo for 2 days. Then take as needed for nausea or vomiting. (Patient not taking: Reported on 12/04/2014) 30 tablet 1  . pegfilgrastim (NEULASTA) 6 MG/0.6ML injection Inject  6 mg into the skin once. Patch delivery system after chemo treatments    . prochlorperazine (COMPAZINE) 10 MG tablet Take 1 tablet (10 mg total) by mouth every 6 (six) hours as needed (Nausea or vomiting). (Patient not taking: Reported on 12/04/2014) 30 tablet 1  . saccharomyces boulardii (FLORASTOR) 250 MG capsule Take 1 capsule (250 mg total) by mouth 2 (two) times daily. (Patient not taking: Reported on 12/28/2014) 60 capsule 2   No current facility-administered medications for this visit.   Facility-Administered Medications Ordered in Other Visits  Medication Dose Route Frequency Provider Last Rate Last Dose  . fludeoxyglucose F - 18 (FDG) injection 8.47 milli Curie  8.47 milli Curie Intravenous Once PRN Medication Radiologist, MD   8.47 milli Curie at 01/05/15 [phone]  . sodium chloride 0.9 % injection 10 mL  10 mL  Intracatheter PRN Chauncey Cruel, MD   10 mL at 12/28/14 1141  . sodium chloride 0.9 % injection 3 mL  3 mL Intravenous PRN Chauncey Cruel, MD          OBJECTIVE: Middle-aged white woman in no acute distress Filed Vitals:   01/06/15 1636  BP: 139/70  Pulse: 89  Temp: 98.4 F (36.9 C)  Resp: 18     Body mass index is 26.89 kg/(m^2).    ECOG FS:1 - Symptomatic but completely ambulatory Filed Weights   01/06/15 1636  Weight: 171 lb 11.2 oz (77.883 kg)   Sclerae unicteric, pupils round and equal Oropharynx shows no thrush; there are 3 small bumpy erythematous lesions in the soft palate posteriorly No palpable cervical or supraclavicular adenopathy; however see photo below of area of erythema in the left neck Lungs no rales or rhonchi Heart regular rate and rhythm Abd soft, nontender, positive bowel sounds MSK no focal spinal tenderness, no upper extremity lymphedema Neuro: nonfocal, well oriented, appropriate affect Breasts: Deferred    Photo 01/06/2015 at 1728 PM     LAB RESULTS: Labs obtained at [facility] earlier today show an absolute neutrophil count of 9.95 out of a total white cell count of 14.9. Hemoglobin is 11.9 and platelets and 33,000.. Creatinine is 0.6 and liver function tests are unremarkable  Lab Results  Component Value Date   WBC 1.8* 12/28/2014   NEUTROABS 1.1* 12/28/2014   HGB 11.4* 12/28/2014   HCT 33.8* 12/28/2014   MCV 103.2* 12/28/2014   PLT 175 12/28/2014      Chemistry      Component Value Date/Time   NA 142 12/28/2014 0817   NA 138 11/19/2014 0510   K 3.8 12/28/2014 0817   K 3.2* 11/19/2014 0510   CL 104 11/19/2014 0510   CL 101 10/18/2012 0859   CO2 29 12/28/2014 0817   CO2 27 11/19/2014 0510   BUN 9.3 12/28/2014 0817   BUN 7 11/19/2014 0510   CREATININE 0.6 12/28/2014 0817   CREATININE 0.59 11/19/2014 0510      Component Value Date/Time   CALCIUM 8.7 12/28/2014 0817   CALCIUM 7.4* 11/19/2014 0510   ALKPHOS 69 12/28/2014 0817     ALKPHOS 94 11/17/2014 2205   AST 16 12/28/2014 0817   AST 22 11/17/2014 2205   ALT 15 12/28/2014 0817   ALT 20 11/17/2014 2205   BILITOT 0.89 12/28/2014 0817   BILITOT 0.6 11/17/2014 2205       STUDIES: Nm Pet Image Restag (ps) Skull Base To Thigh  01/05/2015   CLINICAL DATA:  Subsequent treatment strategy for breast cancer.  EXAM: NUCLEAR  MEDICINE PET SKULL BASE TO THIGH  TECHNIQUE: 8.47 mCi F-18 FDG was injected intravenously. Full-ring PET imaging was performed from the skull base to thigh after the radiotracer. CT data was obtained and used for attenuation correction and anatomic localization.  FASTING BLOOD GLUCOSE:  Value: 94 mg/dl  COMPARISON:  11/18/2014  FINDINGS: NECK  Resolution lower cervical hypermetabolic lymphadenopathy. A few tiny scattered lymph nodes are noted.  CHEST  A left-sided Port-A-Cath is in place. Status post right mastectomy and right lymph node dissection. No residual right axillary lymphadenopathy. No hypermetabolism. No left-sided breast mass or left-sided adenopathy.  Resolution of hypermetabolic mediastinal and hilar lymphadenopathy. There is some residual fullness in the right hilum but no hypermetabolism.  There is a persistent right-sided pleural effusion. Focus of hypermetabolism in the anterior aspect of the right upper lobe is most likely radiation change. No persistent hypermetabolic pulmonary metastatic disease. Persistent moderate interstitial thickening in the right lower lobe. No new lung lesions.  ABDOMEN/PELVIS  Near complete resolution of hepatic metastatic disease. There is a faintly metabolically active lesion in segment 4 a. SUV max is 4.5. The adrenal metastatic lesions have resolved. No enlarged or hypermetabolic abdominal/pelvic lymphadenopathy.  SKELETON  Diffuse osseous uptake is likely due to rebound from chemotherapy or marrow stimulating drugs.  IMPRESSION: 1. Excellent response to therapy is demonstrated with resolution of neck and chest  lymphadenopathy. The pulmonary metastatic disease is markedly improved. Near complete resolution of liver metastasis and adrenal gland metastasis. 2. Hypermetabolic area in the right upper lobe may be related to radiation change. 3. One residual mildly hypermetabolic liver metastasis. 4. Diffuse osseous uptake is likely due to rebound or marrow stimulating drugs. 5. Persistent right pleural effusion.   Electronically Signed   By: Marijo Sanes M.D.   On: 01/05/2015 10:14    ASSESSMENT: 49 y.o. BRCA negative [place] woman with triple-negative stage IV breast cancer  (1) an status post right breast upper outer quadrant and right axillary lymph node biopsy 04/04/2012, both positive for an invasive ductal carcinoma, grade 3, triple negative, with an MIB-1 of 25%  (2) Treated neoadjuvantly with  (a) fluorouracil, cyclophosphamide, and epirubicin (at 100 mg/M2) x4 completed 06/07/2012  (b) docetaxel (75 mg/M2) for one dose, 06/21/2012, poorly tolerated  (c) carboplatin and gemcitabine given every 21 days for 6 cycles completed 10/11/2012  (3) status post right modified radical mastectomy 11/18/2012 showing a complete pathologic response--all 16 lymph nodes were benign  (a) no plans for reconstructon  (4) adjuvant radiation therapy completed 03/06/2013  METASTATIC DISEASE June 2016 (5) 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, 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  (6) staging PET scan 11/06/2014 showed significant disease in the middle and lower lobes of the Right lung, Right effusion, multiple liver and bone lesions, as well as left lung nodules, a Right adrenal nodule, and widespread hypermetabolic adenopathy  (a) brain MRI 11/16/2014 showed multiple cerebellar lesions  (7) abraxane day 1 and day 8 of each 21 day cycle started 11/09/2014  (a) PET scan 01/05/2015 shows an excellent initial response after 3 cycles  (8) zolendronate started 11/16/2014, to be repeated every  12 weeks  (9) Foundation 1 study sent  11/06/2014: patient's sister in law Garvin Fila following [phone])  (a) 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Dresser, NTRK1, MYC, ARID1A and LYN, none w approved therapies  (b) pazopanib, ponatinib or crizotinib suggested as possible off-protocol options  (10) Brain metastases:  (a) s/p SRS therapy 12/11/2014 to 4 right cerebellar lesions  PLAN: I am  delighted with the response Nicole Valentine is having to Abraxane. This is even more remarkable because she is tolerating it so well.  As far as that is concerned the plan is going to be to continue Abraxane for 3 more cycles before restaging. At that time we will obtain a chest CT and liver MRI.  She likely will have a staging MRI of the brain in October. Our on restaging studies will follow that 1.  Dalayah understands that in cases where a complete response is obtained we continue to treat beyond a complete response along his the chemotherapy is well-tolerated 2 improve the depth of response. We are not expecting a cure as she knows.  On the other hand at some point we will likely stop the current treatment in at that time she may be a good candidate for one of the trials that try to extend remissions in triple negative tumors by using androgens or similar agents  Today I am concerned about the erythema notable both at her port site and at the neck, where the port leads. I think this is most consistent with a tunnel infection. I am worried about the possibility of MRSA. I am obtaining 2 blood cultures and a urine culture and since she recently completed a course of Cipro (for urinary tract symptoms) and this erythema developed on that agent, I am starting her on Augmentin and Septra DS both taken twice a day for 10 days. I'm going to ask her to "drop in" just to take a look on August 19. Very likely we will postpone her next chemotherapy, do August 22, to start August 29.  Nicole Valentine has a good understanding of this plan.  She knows to call for any problems that may develop before her next visit here.  Chauncey Cruel, MD   01/06/2015 4:37 PM

## 2015-01-06 NOTE — Progress Notes (Signed)
Urine culture obtained.  Blood cultures obtained from [facility] and [facility].

## 2015-01-07 ENCOUNTER — Other Ambulatory Visit: Payer: Self-pay | Admitting: Pharmacist

## 2015-01-07 ENCOUNTER — Telehealth: Payer: Self-pay | Admitting: *Deleted

## 2015-01-07 ENCOUNTER — Ambulatory Visit (HOSPITAL_BASED_OUTPATIENT_CLINIC_OR_DEPARTMENT_OTHER): Payer: BLUE CROSS/BLUE SHIELD | Admitting: Nurse Practitioner

## 2015-01-07 ENCOUNTER — Ambulatory Visit (HOSPITAL_BASED_OUTPATIENT_CLINIC_OR_DEPARTMENT_OTHER): Payer: BLUE CROSS/BLUE SHIELD

## 2015-01-07 ENCOUNTER — Other Ambulatory Visit: Payer: Self-pay | Admitting: Oncology

## 2015-01-07 VITALS — BP 138/78 | HR 86 | Temp 98.5°F

## 2015-01-07 DIAGNOSIS — L538 Other specified erythematous conditions: Secondary | ICD-10-CM

## 2015-01-07 DIAGNOSIS — C50411 Malignant neoplasm of upper-outer quadrant of right female breast: Secondary | ICD-10-CM

## 2015-01-07 DIAGNOSIS — C50919 Malignant neoplasm of unspecified site of unspecified female breast: Secondary | ICD-10-CM

## 2015-01-07 DIAGNOSIS — L0291 Cutaneous abscess, unspecified: Secondary | ICD-10-CM

## 2015-01-07 DIAGNOSIS — R7881 Bacteremia: Secondary | ICD-10-CM

## 2015-01-07 DIAGNOSIS — T7840XA Allergy, unspecified, initial encounter: Secondary | ICD-10-CM

## 2015-01-07 DIAGNOSIS — C7801 Secondary malignant neoplasm of right lung: Secondary | ICD-10-CM | POA: Diagnosis not present

## 2015-01-07 DIAGNOSIS — C7931 Secondary malignant neoplasm of brain: Secondary | ICD-10-CM

## 2015-01-07 DIAGNOSIS — C778 Secondary and unspecified malignant neoplasm of lymph nodes of multiple regions: Secondary | ICD-10-CM

## 2015-01-07 DIAGNOSIS — C787 Secondary malignant neoplasm of liver and intrahepatic bile duct: Secondary | ICD-10-CM

## 2015-01-07 DIAGNOSIS — L0390 Cellulitis, unspecified: Principal | ICD-10-CM

## 2015-01-07 DIAGNOSIS — C7802 Secondary malignant neoplasm of left lung: Secondary | ICD-10-CM | POA: Diagnosis not present

## 2015-01-07 DIAGNOSIS — C7971 Secondary malignant neoplasm of right adrenal gland: Secondary | ICD-10-CM

## 2015-01-07 DIAGNOSIS — T80212S Local infection due to central venous catheter, sequela: Secondary | ICD-10-CM

## 2015-01-07 MED ORDER — VANCOMYCIN HCL 10 G IV SOLR
1500.0000 mg | Freq: Once | INTRAVENOUS | Status: AC
  Administered 2015-01-07: 1500 mg via INTRAVENOUS
  Filled 2015-01-07: qty 1500

## 2015-01-07 MED ORDER — FAMOTIDINE IN NACL 20-0.9 MG/50ML-% IV SOLN
20.0000 mg | Freq: Once | INTRAVENOUS | Status: AC
  Administered 2015-01-07: 20 mg via INTRAVENOUS

## 2015-01-07 MED ORDER — SODIUM CHLORIDE 0.9 % IV SOLN
Freq: Once | INTRAVENOUS | Status: AC
  Administered 2015-01-07: 16:00:00 via INTRAVENOUS

## 2015-01-07 MED ORDER — VANCOMYCIN HCL 10 G IV SOLR
1250.0000 mg | Freq: Once | INTRAVENOUS | Status: DC
  Filled 2015-01-07: qty 1250

## 2015-01-07 MED ORDER — HEPARIN SOD (PORK) LOCK FLUSH 100 UNIT/ML IV SOLN
500.0000 [IU] | Freq: Once | INTRAVENOUS | Status: AC
  Administered 2015-01-07: 500 [IU]
  Filled 2015-01-07: qty 5

## 2015-01-07 MED ORDER — DIPHENHYDRAMINE HCL 50 MG/ML IJ SOLN
50.0000 mg | Freq: Once | INTRAMUSCULAR | Status: AC
  Administered 2015-01-07: 50 mg via INTRAVENOUS

## 2015-01-07 MED ORDER — METHYLPREDNISOLONE SODIUM SUCC 125 MG IJ SOLR
125.0000 mg | Freq: Once | INTRAMUSCULAR | Status: AC
  Administered 2015-01-07: 125 mg via INTRAVENOUS

## 2015-01-07 MED ORDER — SODIUM CHLORIDE 0.9 % IJ SOLN
10.0000 mL | Freq: Once | INTRAMUSCULAR | Status: AC
  Administered 2015-01-07: 10 mL
  Filled 2015-01-07: qty 10

## 2015-01-07 NOTE — Telephone Encounter (Signed)
Pt called to TRIAGE with update on port status post taking 3 doses of antibiotic.  Port site redness has increased in diameter as well as a new " like swollen area with pus " at site of insertion on neck "  Maritssa denies any fever- " just didn't want to wait until tomorrow since there are changes "  Per MD pt is come in now for IV Vancomycin.  Presently he has communicated with [facility] for port removal as well as requesting 10 days of IV Vancomycin.  Pt will come in now for IV therapy.

## 2015-01-07 NOTE — Patient Instructions (Signed)
Vancomycin injection What is this medicine? VANCOMYCIN (van koe MYE sin) is a glycopeptide antibiotic. It is used to treat certain kinds of bacterial infections. It will not work for colds, flu, or other viral infections. This medicine may be used for other purposes; ask your health care provider or pharmacist if you have questions. COMMON BRAND NAME(S): Vancocin What should I tell my health care provider before I take this medicine? They need to know if you have any of these conditions: -dehydration -hearing loss -kidney disease -other chronic illness -an unusual or allergic reaction to vancomycin, other medicines, foods, dyes, or preservatives -pregnant or trying to get pregnant -breast-feeding How should I use this medicine? This medicine is infused into a vein. It is usually given by a health care provider in a hospital or clinic. If you receive this medicine at home, you will receive special instructions. Take your medicine at regular intervals. Do not take your medicine more often than directed. Take all of your medicine as directed even if you think you are better. Do not skip doses or stop your medicine early. It is important that you put your used needles and syringes in a special sharps container. Do not put them in a trash can. If you do not have a sharps container, call your pharmacist or healthcare provider to get one. Talk to your pediatrician regarding the use of this medicine in children. While this drug may be prescribed for even very young infants for selected conditions, precautions do apply. Overdosage: If you think you have taken too much of this medicine contact a poison control center or emergency room at once. NOTE: This medicine is only for you. Do not share this medicine with others. What if I miss a dose? If you miss a dose, take it as soon as you can. If it is almost time for your next dose, take only that dose. Do not take double or extra doses. What may interact  with this medicine? -amphotericin B -anesthetics -bacitracin -birth control pills -cisplatin -colistin -diuretics -other aminoglycoside antibiotics -polymyxin B This list may not describe all possible interactions. Give your health care provider a list of all the medicines, herbs, non-prescription drugs, or dietary supplements you use. Also tell them if you smoke, drink alcohol, or use illegal drugs. Some items may interact with your medicine. What should I watch for while using this medicine? Tell your doctor or health care professional if your symptoms do not improve or if you get new symptoms. Your condition and lab work will be monitored while you are taking this medicine. Do not treat diarrhea with over the counter products. Contact your doctor if you have diarrhea that lasts more than 2 days or if it is severe and watery. What side effects may I notice from receiving this medicine? Side effects that you should report to your doctor or health care professional as soon as possible: -allergic reactions like skin rash, itching or hives, swelling of the face, lips, or tongue -breathing difficulty, wheezing -change in amount, color of urine -change in hearing -chest pain -dizziness -fever, chills -flushing of the face and neck (reddening) -low blood pressure -redness, blistering, peeling or loosening of the skin, including inside the mouth -unusual bleeding or bruising -unusually weak or tired Side effects that usually do not require medical attention (report to your doctor or health care professional if they continue or are bothersome): -nausea, vomiting -pain, swelling where injected -stomach cramps This list may not describe all possible side effects.   Call your doctor for medical advice about side effects. You may report side effects to FDA at 1-800-FDA-1088. Where should I keep my medicine? Keep out of the reach of children. You will be instructed on how to store this medicine,  if needed. Throw away any unused medicine after the expiration date on the label. NOTE: This sheet is a summary. It may not cover all possible information. If you have questions about this medicine, talk to your doctor, pharmacist, or health care provider.  2015, Elsevier/Gold Standard. (2012-12-13 14:46:02)  

## 2015-01-07 NOTE — Telephone Encounter (Signed)
Per [facility]- port placed by IR- not their service- best managed by IR for removal and PICC line placement.  This RN spoke with IR and obtained time for procedure at 10am on 01/08/2015.  This RN left a message with Short Stay for a return call for possible coordination for IV Vancomycin post procedure.

## 2015-01-07 NOTE — Telephone Encounter (Signed)
This RN spoke with [facility] in [place] regarding need to provide home health nursing for IV Vancomycin BID.  Mary requested orders to be faxed to her - concern at present is staffing for nursing availability.  Orders obtained for above.  Per MD - pt will need port removed tomorrow- MD contacted [facility] and spoke with covering MD- Dr Lucia Gaskins.  Per [facility] pharmacy pt will need a central line for Vancomycin.  Order placed for single lumen PICC to be placed by IR.  This RN contacted [facility] for appointment time for port removal and spoke with Candy- she states appointment is in process and will be done as an office procedure.  She will relay need for call back to this RN with appointment time due to need for PICC line placement and IV vancomycin appointments.

## 2015-01-07 NOTE — Progress Notes (Signed)
Pt here for IV Vancomycin.  Per Karna Christmas, RN  -  OK to use portacath for Vancomycin as per Dr. [name]. Pt tolerated Vancomycin infusion without difficulty.   1730 -  Noted pt developed redness on face and neck.  Vancomycin stopped.  Normal saline running at 200cc/hr.  Pt denied itching, denied pain, remained A&O x 3.  Pt had received approximately 375cc of Vancomycin. Jenny Reichmann, NP symptom management notified.  Orders received to give Benadryl 50 mg, Pepcid 20 mg, and Solumedrol 125 mg IV.  Meds given as ordered.  See MAR for administration time. Normal saline rate decreased to 100cc/hr.  Pt continued to be monitored closely. 1800 -  Dr. Alen Blew came to infusion room to assess pt.  Vancomycin not resumed as per md.

## 2015-01-08 ENCOUNTER — Other Ambulatory Visit: Payer: Self-pay | Admitting: Radiology

## 2015-01-08 ENCOUNTER — Other Ambulatory Visit: Payer: Self-pay | Admitting: Oncology

## 2015-01-08 ENCOUNTER — Encounter (HOSPITAL_COMMUNITY): Payer: Self-pay

## 2015-01-08 ENCOUNTER — Telehealth: Payer: Self-pay | Admitting: *Deleted

## 2015-01-08 ENCOUNTER — Ambulatory Visit (HOSPITAL_COMMUNITY)
Admission: RE | Admit: 2015-01-08 | Discharge: 2015-01-08 | Disposition: A | Discharge status: Home / Self Care | Payer: BLUE CROSS/BLUE SHIELD | Source: Ambulatory Visit | Attending: Oncology | Admitting: Oncology

## 2015-01-08 ENCOUNTER — Ambulatory Visit: Payer: BLUE CROSS/BLUE SHIELD

## 2015-01-08 DIAGNOSIS — C50411 Malignant neoplasm of upper-outer quadrant of right female breast: Secondary | ICD-10-CM

## 2015-01-08 DIAGNOSIS — C50919 Malignant neoplasm of unspecified site of unspecified female breast: Secondary | ICD-10-CM

## 2015-01-08 DIAGNOSIS — T80212S Local infection due to central venous catheter, sequela: Secondary | ICD-10-CM

## 2015-01-08 LAB — BASIC METABOLIC PANEL
ANION GAP: 7 (ref 5–15)
BUN: 13 mg/dL (ref 6–20)
CHLORIDE: 107 mmol/L (ref 101–111)
CO2: 23 mmol/L (ref 22–32)
CREATININE: 0.69 mg/dL (ref 0.44–1.00)
Calcium: 9 mg/dL (ref 8.9–10.3)
GFR calc non Af Amer: >60 mL/min (ref >60)
GLUCOSE: 119 mg/dL — AB (ref 65–99)
Potassium: 3.9 mmol/L (ref 3.5–5.1)
Sodium: 137 mmol/L (ref 135–145)

## 2015-01-08 LAB — URINALYSIS, ROUTINE W REFLEX MICROSCOPIC
BILIRUBIN URINE: NEGATIVE
GLUCOSE, UA: NEGATIVE
HGB URINE DIPSTICK: NEGATIVE
Ketones, ur: NEGATIVE
LEUKOCYTES UA: NEGATIVE
Nitrite: NEGATIVE
PH: 6.5 (ref 5.0–8.0)
PROTEIN: NEGATIVE
Specific Gravity, Urine: 1.009 (ref 1.001–1.035)

## 2015-01-08 LAB — CBC
HCT: 38.8 % (ref 36.0–46.0)
HEMOGLOBIN: 12.8 g/dL (ref 12.0–15.0)
MCH: 34.7 pg — AB (ref 26.0–34.0)
MCHC: 33 g/dL (ref 30.0–36.0)
MCV: 105.1 fL — AB (ref 78.0–100.0)
Platelets: 165 10*3/uL (ref 150–400)
RBC: 3.69 MIL/uL — AB (ref 3.87–5.11)
RDW: 16.1 % — ABNORMAL HIGH (ref 11.5–15.5)
WBC: 15.3 10*3/uL — ABNORMAL HIGH (ref 4.0–10.5)

## 2015-01-08 LAB — URINE CULTURE

## 2015-01-08 LAB — APTT: aPTT: 30 seconds (ref 24–37)

## 2015-01-08 LAB — PROTIME-INR
INR: 1.1 (ref 0.00–1.49)
Prothrombin Time: 14.4 seconds (ref 11.6–15.2)

## 2015-01-08 MED ORDER — FENTANYL CITRATE (PF) 100 MCG/2ML IJ SOLN
INTRAMUSCULAR | Status: AC
  Filled 2015-01-08: qty 2

## 2015-01-08 MED ORDER — LINEZOLID 600 MG PO TABS: 600.0000 mg | ORAL_TABLET | Freq: Two times a day (BID) | ORAL | Status: DC

## 2015-01-08 MED ORDER — SODIUM CHLORIDE 0.9 % IV SOLN
Freq: Once | INTRAVENOUS | Status: AC
  Administered 2015-01-08: 10:00:00 via INTRAVENOUS

## 2015-01-08 MED ORDER — MIDAZOLAM HCL 2 MG/2ML IJ SOLN
INTRAMUSCULAR | Status: AC | PRN
  Administered 2015-01-08 (×3): 0.5 mg via INTRAVENOUS
  Administered 2015-01-08: 1 mg via INTRAVENOUS

## 2015-01-08 MED ORDER — LIDOCAINE HCL 1 % IJ SOLN
INTRAMUSCULAR | Status: AC
  Filled 2015-01-08: qty 20

## 2015-01-08 MED ORDER — CEFAZOLIN SODIUM-DEXTROSE 2-3 GM-% IV SOLR
2.0000 g | Freq: Once | INTRAVENOUS | Status: AC
  Administered 2015-01-08: 2 g via INTRAVENOUS

## 2015-01-08 MED ORDER — CEFAZOLIN SODIUM-DEXTROSE 2-3 GM-% IV SOLR
INTRAVENOUS | Status: AC
  Filled 2015-01-08: qty 50

## 2015-01-08 MED ORDER — FENTANYL CITRATE (PF) 100 MCG/2ML IJ SOLN
INTRAMUSCULAR | Status: AC | PRN
Start: 2015-01-08 — End: 2015-01-08
  Administered 2015-01-08: 50 ug via INTRAVENOUS
  Administered 2015-01-08 (×2): 25 ug via INTRAVENOUS

## 2015-01-08 MED ORDER — MIDAZOLAM HCL 2 MG/2ML IJ SOLN
INTRAMUSCULAR | Status: AC
  Filled 2015-01-08: qty 4

## 2015-01-08 NOTE — Telephone Encounter (Signed)
This  RN per MD and pharmacy contacted pt's prescription benefits for prior authorization for Zyvox due to noted reaction to vancomycin.  Contact number is for [employer] at [phone].  Per review obtained prior authorization effective 01/07/2015-02/07/2015 with number [id number].  Above called to Aaron Edelman at [place] due to need for pt to start today.  Of note- post Aaron Edelman running [employer] he was able to apply a coupon that brings the cost of the medication from $84 to $1.  Prescription will be available for pick up with in the hour.  This RN contacted IR and short stay - PICC line cancelled and IV Vancomycin cancelled.  Pt will be informed to pick up antibiotic from [place].  This RN contacted pt's husband and discussed the above with confirmation that medication will be picked up and started today.

## 2015-01-08 NOTE — Progress Notes (Signed)
Per Valerie/ Dr. [name] Procedure for port removal only. No Picc line. To start po antibiotic today pick up at [facility] Inter. Radiology has been notified. Verified with [name] in IR procedure for port removal only.

## 2015-01-08 NOTE — Discharge Instructions (Signed)
Incision Care °An incision is when a surgeon cuts into your body tissues. After surgery, the incision needs to be cared for properly to prevent infection.  °HOME CARE INSTRUCTIONS  °· Take all medicine as directed by your caregiver. Only take over-the-counter or prescription medicines for pain, discomfort, or fever as directed by your caregiver. °· Do not remove your bandage (dressing) or get your incision wet until your surgeon gives you permission. In the event that your dressing becomes wet, dirty, or starts to smell, change the dressing and call your surgeon for instructions as soon as possible. °· Take showers. Do not take tub baths, swim, or do anything that may soak the wound until it is healed. °· Resume your normal diet and activities as directed or allowed. °· Avoid lifting any weight until you are instructed otherwise. °· Use anti-itch antihistamine medicine as directed by your caregiver. The wound may itch when it is healing. Do not pick or scratch at the wound. °· Follow up with your caregiver for stitch (suture) or staple removal as directed. °· Drink enough fluids to keep your urine clear or pale yellow. °SEEK MEDICAL CARE IF:  °· You have redness, swelling, or increasing pain in the wound that is not controlled with medicine. °· You have drainage, blood, or pus coming from the wound that lasts longer than 1 day. °· You develop muscle aches, chills, or a general ill feeling. °· You notice a bad smell coming from the wound or dressing. °· Your wound edges separate after the sutures, staples, or skin adhesive strips have been removed. °· You develop persistent nausea or vomiting. °SEEK IMMEDIATE MEDICAL CARE IF:  °· You have a fever. °· You develop a rash. °· You develop dizzy episodes or faint while standing. °· You have difficulty breathing. °· You develop any reaction or side effects to medicine given. °MAKE SURE YOU:  °· Understand these instructions. °· Will watch your condition. °· Will get help  right away if you are not doing well or get worse. °Document Released: 11/25/2004 Document Revised: 07/31/2011 Document Reviewed: 07/02/2013 °ExitCare® Patient Information ©2015 [employer]. This information is not intended to replace advice given to you by your health care provider. Make sure you discuss any questions you have with your health care provider. °Conscious Sedation °Sedation is the use of medicines to promote relaxation and relieve discomfort and anxiety. Conscious sedation is a type of sedation. Under conscious sedation you are less alert than normal but are still able to respond to instructions or stimulation. Conscious sedation is used during short medical and dental procedures. It is milder than deep sedation or general anesthesia and allows you to return to your regular activities sooner.  °LET YOUR HEALTH CARE PROVIDER KNOW ABOUT:  °· Any allergies you have. °· All medicines you are taking, including vitamins, herbs, eye drops, creams, and over-the-counter medicines. °· Use of steroids (by mouth or creams). °· Previous problems you or members of your family have had with the use of anesthetics. °· Any blood disorders you have. °· Previous surgeries you have had. °· Medical conditions you have. °· Possibility of pregnancy, if this applies. °· Use of cigarettes, alcohol, or illegal drugs. °RISKS AND COMPLICATIONS °Generally, this is a safe procedure. However, as with any procedure, problems can occur. Possible problems include: °· Oversedation. °· Trouble breathing on your own. You may need to have a breathing tube until you are awake and breathing on your own. °· Allergic reaction to any of   the medicines used for the procedure. °BEFORE THE PROCEDURE °· You may have blood tests done. These tests can help show how well your kidneys and liver are working. They can also show how well your blood clots. °· A physical exam will be done.   °· Only take medicines as directed by your health care provider.  You may need to stop taking medicines (such as blood thinners, aspirin, or nonsteroidal anti-inflammatory drugs) before the procedure.   °· Do not eat or drink at least 6 hours before the procedure or as directed by your health care provider. °· Arrange for a responsible adult, family member, or friend to take you home after the procedure. He or she should stay with you for at least 24 hours after the procedure, until the medicine has worn off. °PROCEDURE  °· An intravenous (IV) catheter will be inserted into one of your veins. Medicine will be able to flow directly into your body through this catheter. You may be given medicine through this tube to help prevent pain and help you relax. °· The medical or dental procedure will be done. °AFTER THE PROCEDURE °· You will stay in a recovery area until the medicine has worn off. Your blood pressure and pulse will be checked.   °·  Depending on the procedure you had, you may be allowed to go home when you can tolerate liquids and your pain is under control. °Document Released: 01/31/2001 Document Revised: 05/13/2013 Document Reviewed: 01/13/2013 °ExitCare® Patient Information ©2015 [employer]. This information is not intended to replace advice given to you by your health care provider. Make sure you discuss any questions you have with your health care provider. ° °

## 2015-01-08 NOTE — Telephone Encounter (Signed)
See other note

## 2015-01-08 NOTE — H&P (Signed)
Chief Complaint: Patient was seen in consultation today for PAC removal at the request of Valentine,[name]  Referring Physician(s): Valentine,[name]  History of Present Illness: Nicole Valentine is a 49 y.o. female   [place] a cath placed 11/12/14 in IR Pt with Hx breast cancer Bony and brain mets Noted just 8/17 reddened area at site of [facility] Became larger and noted neck site also reddened and tender Now scheduled for removal per Dr [name]   Past Medical History  Diagnosis Date  . Complication of anesthesia     her father and uncle both had very difficult time waking up-was  something they told them may be hereditory. she will find out.  [name] Hypothyroidism   . 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 Radiation 01/21/13-03/06/13    Right Breast  . Breast cancer dx'd 04-10-12-rt    Past Surgical History  Procedure Laterality Date  . Wisdom tooth extraction    . Portacath placement  04/22/2012    Procedure: INSERTION PORT-A-CATH;  Surgeon: [name], MD;  Location: [facility];  Service: General;  Laterality: Left;  [name] Modified mastectomy Right 11/18/2012    Procedure:  RIGHT MODIFIED MASTECTOMY;  Surgeon: [name], MD;  Location: [facility];  Service: General;  Laterality: Right;  . Knee arthroscopy Right 11/26/2013  . Port-a-cath removal Left 12/10/2013    Procedure: MINOR REMOVAL PORT-A-CATH;  Surgeon: [name], MD;  Location: [facility];  Service: General;  Laterality: Left;    Allergies: Almond meal; Almond oil; and Other  Medications: Prior to Admission medications   Medication Sig Start Date End Date Taking? Authorizing Provider  amoxicillin-clavulanate (AUGMENTIN) 875-125 MG per tablet Take 1 tablet by mouth 2 (two) times daily. Patient taking differently: Take 1 tablet by mouth 2 (two) times daily. Started 8/17 evening, end date 8/24 01/06/15  Yes Chauncey Cruel, MD  ibuprofen (ADVIL,MOTRIN) 200 MG  tablet Take 400 mg by mouth every 6 (six) hours as needed for headache or moderate pain.   Yes Historical Provider, MD  Ibuprofen-Diphenhydramine HCl 200-25 MG CAPS Take 1 capsule by mouth at bedtime as needed (sleep).   Yes Historical Provider, MD  levothyroxine (SYNTHROID, LEVOTHROID) 175 MCG tablet Take 175 mcg by mouth daily before breakfast.   Yes Historical Provider, MD  Loratadine-Pseudoephedrine (CLARITIN-D 12 HOUR PO) Take 1 tablet by mouth 2 (two) times daily as needed (allergies).   Yes Historical Provider, MD  LORazepam (ATIVAN) 0.5 MG tablet Take 0.5 mg by mouth at bedtime as needed for sleep.  11/06/14  Yes Historical Provider, MD  saccharomyces boulardii (FLORASTOR) 250 MG capsule Take 1 capsule (250 mg total) by mouth 2 (two) times daily. Patient taking differently: Take 750-1,000 mg by mouth as directed. Patient alternates 3 caps one day the next she takes 4 11/19/14  Yes [name], MD  cholecalciferol (VITAMIN D) 1000 UNITS tablet Take 2,000 Units by mouth 2 (two) times daily.     Historical Provider, MD  lidocaine-prilocaine (EMLA) cream  11/06/14   Historical Provider, MD  linezolid (ZYVOX) 600 MG tablet Take 1 tablet (600 mg total) by mouth 2 (two) times daily. Patient not taking: Reported on 01/08/2015 01/08/15   Chauncey Cruel, MD  ondansetron (ZOFRAN) 8 MG tablet Take 1 tablet (8 mg total) by mouth 2 (two) times daily. Start the day after chemo for 2 days. Then take as needed for nausea or vomiting. 11/06/14  Chauncey Cruel, MD  prochlorperazine (COMPAZINE) 10 MG tablet Take 1 tablet (10 mg total) by mouth every 6 (six) hours as needed (Nausea or vomiting). 11/06/14   Chauncey Cruel, MD  sulfamethoxazole-trimethoprim (BACTRIM DS,SEPTRA DS) 800-160 MG per tablet Take 1 tablet by mouth 2 (two) times daily. Patient not taking: Reported on 01/08/2015 01/06/15   Chauncey Cruel, MD     Family History  Problem Relation Age of Onset  . Lung cancer Maternal Grandfather 84      smoker  . Prostate cancer Maternal Grandfather 90  . Throat cancer Other     Great Aunt x 2  . Liver cancer Other     Maternal Great Grandmother  . Melanoma Maternal Uncle 81  . Brain cancer Cousin 11    non-malignant    Social History   Social History  . Marital Status: Married    Spouse Name: N/A  . Number of Children: N/A  . Years of Education: N/A   Social History Main Topics  . Smoking status: Never Smoker   . Smokeless tobacco: Never Used  . Alcohol Use: No  . Drug Use: No  . Sexual Activity: Yes   Other Topics Concern  . None   Social History Narrative     Review of Systems: A 12 point ROS discussed and pertinent positives are indicated in the HPI above.  All other systems are negative.  Review of Systems  Constitutional: Positive for chills. Negative for fever, activity change and appetite change.  Respiratory: Negative for shortness of breath.   Neurological: Negative for weakness.  Psychiatric/Behavioral: Negative for behavioral problems and confusion.    Vital Signs: BP 112/73 mmHg  Pulse 84  Temp(Src) 98.4 F (36.9 C) (Oral)  Resp 16  Ht 5\' 7"  (1.702 m)  Wt 169 lb (76.658 kg)  BMI 26.46 kg/m2  SpO2 96%  LMP 03/29/2012  Physical Exam  Constitutional: She is oriented to person, place, and time. She appears well-nourished.  Cardiovascular: Normal rate, regular rhythm and normal heart sounds.   No murmur heard. Pulmonary/Chest: Effort normal and breath sounds normal. She has no wheezes.  Abdominal: Soft. Bowel sounds are normal. There is no tenderness.  Musculoskeletal: Normal range of motion.  Neurological: She is alert and oriented to person, place, and time.  Skin: Skin is dry. There is erythema.  PAC site reddened approx 4x4 cm area Tender  Also red and swelling at IJ site  Psychiatric: She has a normal mood and affect. Her behavior is normal. Judgment and thought content normal.  Nursing note and vitals reviewed.   Mallampati  Score:  MD Evaluation Airway: WNL Heart: WNL Abdomen: WNL Chest/ Lungs: WNL ASA  Classification: 3 Mallampati/Airway Score: One  Imaging: Nm Pet Image Restag (ps) Skull Base To Thigh  01/05/2015   CLINICAL DATA:  Subsequent treatment strategy for breast cancer.  EXAM: NUCLEAR MEDICINE PET SKULL BASE TO THIGH  TECHNIQUE: 8.47 mCi F-18 FDG was injected intravenously. Full-ring PET imaging was performed from the skull base to thigh after the radiotracer. CT data was obtained and used for attenuation correction and anatomic localization.  FASTING BLOOD GLUCOSE:  Value: 94 mg/dl  COMPARISON:  11/18/2014  FINDINGS: NECK  Resolution lower cervical hypermetabolic lymphadenopathy. A few tiny scattered lymph nodes are noted.  CHEST  A left-sided Port-A-Cath is in place. Status post right mastectomy and right lymph node dissection. No residual right axillary lymphadenopathy. No hypermetabolism. No left-sided breast mass or left-sided adenopathy.  Resolution  of hypermetabolic mediastinal and hilar lymphadenopathy. There is some residual fullness in the right hilum but no hypermetabolism.  There is a persistent right-sided pleural effusion. Focus of hypermetabolism in the anterior aspect of the right upper lobe is most likely radiation change. No persistent hypermetabolic pulmonary metastatic disease. Persistent moderate interstitial thickening in the right lower lobe. No new lung lesions.  ABDOMEN/PELVIS  Near complete resolution of hepatic metastatic disease. There is a faintly metabolically active lesion in segment 4 a. SUV max is 4.5. The adrenal metastatic lesions have resolved. No enlarged or hypermetabolic abdominal/pelvic lymphadenopathy.  SKELETON  Diffuse osseous uptake is likely due to rebound from chemotherapy or marrow stimulating drugs.  IMPRESSION: 1. Excellent response to therapy is demonstrated with resolution of neck and chest lymphadenopathy. The pulmonary metastatic disease is markedly improved.  Near complete resolution of liver metastasis and adrenal gland metastasis. 2. Hypermetabolic area in the right upper lobe may be related to radiation change. 3. One residual mildly hypermetabolic liver metastasis. 4. Diffuse osseous uptake is likely due to rebound or marrow stimulating drugs. 5. Persistent right pleural effusion.   Electronically Signed   By: Marijo Sanes M.D.   On: 01/05/2015 10:14    Labs:  CBC:  Recent Labs  11/30/14 1125 12/07/14 1128 12/28/14 0817 01/08/15 1020  WBC 5.5 2.9* 1.8* 15.3*  HGB 12.2 11.8 11.4* 12.8  HCT 36.4 35.2 33.8* 38.8  PLT 204 214 175 165    COAGS:  Recent Labs  11/06/14 1118 11/12/14 1310 01/08/15 1020  INR 1.10 1.10 1.10  APTT 32 30 30    BMP:  Recent Labs  11/17/14 2205 11/18/14 0550 11/19/14 0510  12/07/14 1128 12/21/14 0825 12/28/14 0817 01/08/15 1020  NA 136 139 138  < > 141 141 142 137  K 3.5 3.6 3.2*  < > 3.8 3.8 3.8 3.9  CL 101 103 104  --   --   --   --  107  CO2 26 28 27   < > 28 26 29 23   GLUCOSE 109* 105* 104*  < > 99 119 94 119*  BUN 15 13 7   < > 11.2 10.1 9.3 13  CALCIUM 8.2* 7.7* 7.4*  < > 9.1 9.4 8.7 9.0  CREATININE 0.58 0.58 0.59  < > 0.7 0.7 0.6 0.69  GFRNONAA >60 >60 >60  --   --   --   --  >60  GFRAA >60 >60 >60  --   --   --   --  >60  < > = values in this interval not displayed.  LIVER FUNCTION TESTS:  Recent Labs  11/30/14 1126 12/07/14 1128 12/21/14 0825 12/28/14 0817  BILITOT 0.52 0.59 0.65 0.89  AST 15 17 19 16   ALT 8 14 11 15   ALKPHOS 90 79 95 69  PROT 6.1* 6.0* 6.6 6.1*  ALBUMIN 3.1* 3.4* 3.8 3.5    TUMOR MARKERS: No results for input(s): AFPTM, CEA, CA199, CHROMGRNA in the last 8760 hours.  Assessment and Plan:  PAC placed 11/12/14 Noted infection x 2 days Now for removal per Dr [name] Pt aware of procedure benefits and risks including but not limited to: Infection; bleeding; vessel damage and damage to surrounding structures She is agreeable to proceed Consent  signed andin chart  Thank you for this interesting consult.  I greatly enjoyed meeting Meloni Hinz and look forward to participating in their care.  A copy of this report was sent to the requesting provider on this date.  Signed: [place] A 01/08/2015, 11:29 AM   I spent a total of  30 Minutes   in face to face in clinical consultation, greater than 50% of which was counseling/coordinating care for [facility] removal

## 2015-01-08 NOTE — Procedures (Signed)
  LIJV PAC removal Catheter tip cultured No comp/EBL

## 2015-01-10 ENCOUNTER — Encounter: Payer: Self-pay | Admitting: Nurse Practitioner

## 2015-01-10 DIAGNOSIS — T7840XA Allergy, unspecified, initial encounter: Secondary | ICD-10-CM | POA: Insufficient documentation

## 2015-01-10 NOTE — Assessment & Plan Note (Signed)
Last received cycle 3, day 8 of her Abraxane chemo on 12/28/14.    Pt returns tomorrow 01/08/15 for planned removal of portacath and placement of a PICC line.

## 2015-01-10 NOTE — Assessment & Plan Note (Signed)
Pt has developed an obvious portacath infection; and presented to the Cancer center today for a vancomycin infusion.Pt's left upper chest portacath site and left neck area with eythema and edema. Pt had received approx 2/3 of the infusion- when she developed "Red Man Syndrome" with generalized erythema to generalized body.  C/o slight itching; but no obvious hives or resp issues. Vitals remained stable throughout.   Vancomycin infusion held; and pt received benadryl, pepcid, and solumedrol.  Symptoms did eventually resolve- but decision was made per Dr. Alen Blew (on call) to hold any further vancomycin today.   Pt is scheduled to return tomorrow for removal of the portacath and placement of PICC line.  She also plans to receive additional vancomycin at that time.   Will follow up with Dr. [name] in the morning to confirm that pt receive premeds prior to the vancomycin; and may also need to consider infusing the vancomycin much slower as well.

## 2015-01-10 NOTE — Progress Notes (Signed)
SYMPTOM MANAGEMENT CLINIC   HPI: Nicole Valentine 49 y.o. female diagnosed with breast cancer.  Currently undergoing Abraxane chemotherapy.   Pt has developed an obvious portacath infection; and presented to the Cancer center today for a vancomycin infusion.Pt's left upper chest portacath site and left neck area with eythema and edema. Pt had received approx 2/3 of the infusion- when she developed "Red Man Syndrome" with generalized erythema to generalized body.  C/o slight itching; but no obvious hives or resp issues. Vitals remained stable throughout.   Vancomycin infusion held; and pt received benadryl, pepcid, and solumedrol.  Symptoms did eventually resolve- but decision was made per Dr. Alen Blew (on call) to hold any further vancomycin today.   Pt is scheduled to return tomorrow for removal of the portacath and placement of PICC line.  She also plans to receive additional vancomycin at that time.   Will follow up with Dr. [name] in the morning to confirm that pt receive premeds prior to the vancomycin; and may also need to consider infusing the vancomycin much slower as well.   HPI  ROS  Past Medical History  Diagnosis Date  . Complication of anesthesia     her father and uncle both had very difficult time waking up-was  something they told them may be hereditory. she will find out.  [name] Hypothyroidism   . 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 Radiation 01/21/13-03/06/13    Right Breast  . Breast cancer dx'd 04-10-12-rt    Past Surgical History  Procedure Laterality Date  . Wisdom tooth extraction    . Portacath placement  04/22/2012    Procedure: INSERTION PORT-A-CATH;  Surgeon: [name], MD;  Location: [facility];  Service: General;  Laterality: Left;  [name] Modified mastectomy Right 11/18/2012    Procedure:  RIGHT MODIFIED MASTECTOMY;  Surgeon: [name], MD;  Location: [place];  Service: General;  Laterality: Right;  . Knee  arthroscopy Right 11/26/2013  . Port-a-cath removal Left 12/10/2013    Procedure: MINOR REMOVAL PORT-A-CATH;  Surgeon: [name], MD;  Location: [place];  Service: General;  Laterality: Left;    has Abdominal wall anomaly; Breast cancer of upper-outer quadrant of right female breast; Lymphedema of arm; Knee pain, right; GERD (gastroesophageal reflux disease); History of breast cancer; Mass of neck; Breast cancer metastasized to bone; Breast cancer metastasized to multiple sites; Brain metastases; Acute respiratory failure with hypoxia; Malignant pleural effusion; SIRS (systemic inflammatory response syndrome); and Hypersensitivity reaction on her problem list.    is allergic to almond meal; almond oil; other; and tegaderm ag mesh.    Medication List       This list is accurate as of: 01/07/15 11:59 PM.  Always use your most recent med list.               amoxicillin-clavulanate 875-125 MG per tablet  Commonly known as:  AUGMENTIN  Take 1 tablet by mouth 2 (two) times daily.     cholecalciferol 1000 UNITS tablet  Commonly known as:  VITAMIN D  Take 2,000 Units by mouth 2 (two) times daily.     ibuprofen 200 MG tablet  Commonly known as:  ADVIL,MOTRIN  Take 400 mg by mouth every 6 (six) hours as needed for headache or moderate pain.     levothyroxine 175 MCG tablet  Commonly known as:  SYNTHROID, LEVOTHROID  Take 175 mcg by mouth daily before breakfast.  lidocaine-prilocaine cream  Commonly known as:  EMLA     loratadine 10 MG tablet  Commonly known as:  CLARITIN  Take 10 mg by mouth.     LORazepam 0.5 MG tablet  Commonly known as:  ATIVAN  Take 0.5 mg by mouth at bedtime as needed for sleep.     ondansetron 8 MG tablet  Commonly known as:  ZOFRAN  Take 1 tablet (8 mg total) by mouth 2 (two) times daily. Start the day after chemo for 2 days. Then take as needed for nausea or vomiting.     prochlorperazine 10 MG tablet  Commonly known as:   COMPAZINE  Take 1 tablet (10 mg total) by mouth every 6 (six) hours as needed (Nausea or vomiting).     saccharomyces boulardii 250 MG capsule  Commonly known as:  FLORASTOR  Take 1 capsule (250 mg total) by mouth 2 (two) times daily.     sulfamethoxazole-trimethoprim 800-160 MG per tablet  Commonly known as:  BACTRIM DS,SEPTRA DS  Take 1 tablet by mouth 2 (two) times daily.         PHYSICAL EXAMINATION  Oncology Vitals 01/08/2015 01/08/2015 01/08/2015 01/08/2015 01/08/2015 01/08/2015 01/08/2015  Height - - - - - - -  Weight - - - - - - -  Weight (lbs) - - - - - - -  BMI (kg/m2) - - - - - - -  Temp - - - 98.3 - - -  Pulse 80 78 83 76 71 75 71  Resp 18 18 18 18 15 16 12   SpO2 97 98 96 96 98 98 97  BSA (m2) - - - - - - -   BP Readings from Last 3 Encounters:  01/08/15 125/72  01/07/15 138/78  01/06/15 139/70    Physical Exam  Constitutional: She is oriented to person, place, and time and well-developed, well-nourished, and in no distress.  HENT:  Head: Normocephalic and atraumatic.  Eyes: Conjunctivae and EOM are normal. Pupils are equal, round, and reactive to light.  Neck: Normal range of motion. Neck supple.  Pulmonary/Chest: Effort normal. No stridor. No respiratory distress.  Musculoskeletal: Normal range of motion.  Neurological: She is alert and oriented to person, place, and time.  Skin: Skin is warm and dry. No rash noted. There is erythema. No pallor.  Pt has developed an obvious portacath infection; and presented to the Cancer center today for a vancomycin infusion.Pt's left upper chest portacath site and left neck area with eythema and edema.     Psychiatric: Affect normal.  Nursing note and vitals reviewed.   LABORATORY DATA:. Orders Only on 01/06/2015  Component Date Value Ref Range Status  . Urine Culture, Routine 01/06/2015 Culture, Urine   Final   Comment: Final - ===== COLONY COUNT: ===== 2,000 COLONIES/ML Insignificant Growth   . Color, Urine  01/06/2015 YELLOW  YELLOW Final   ** Please note change in unit of measure and reference range(s). **   . APPearance 01/06/2015 CLEAR  CLEAR Final  . Specific Gravity, Urine 01/06/2015 1.009  1.001 - 1.035 Final  . pH 01/06/2015 6.5  5.0 - 8.0 Final  . Glucose, UA 01/06/2015 NEGATIVE  NEGATIVE Final  . Bilirubin Urine 01/06/2015 NEGATIVE  NEGATIVE Final  . Ketones, ur 01/06/2015 NEGATIVE  NEGATIVE Final  . Hgb urine dipstick 01/06/2015 NEGATIVE  NEGATIVE Final  . Protein, ur 01/06/2015 NEGATIVE  NEGATIVE Final  . Nitrite 01/06/2015 NEGATIVE  NEGATIVE Final  . Leukocytes, UA 01/06/2015 NEGATIVE  NEGATIVE Final  Hospital Outpatient Visit on 01/05/2015  Component Date Value Ref Range Status  . Glucose-Capillary 01/05/2015 94  65 - 99 mg/dL Final     RADIOGRAPHIC STUDIES: Ir Removal [employer] W/o Fl Mod Sed  01/08/2015   CLINICAL DATA:  Infected left jugular Port-A-Cath. Examination of the left chest demonstrates erythema without evidence of pus. Examination of the port site demonstrates no evidence of fluctuance.  EXAM: REMOVAL RIGHT IJ VEIN PORT-A-CATH  PROCEDURE: The left chest was prepped and draped in a sterile fashion. Lidocaine was utilized for local anesthesia. An incision was made over the previously healed surgical incision. There is no evidence of pus or fluid within the port pocket. Hopefully, there is only cellulitis in the overlying skin. Therefore, the plan is to close the incision by primary intention. Utilizing blunt dissection, the port catheter and reservoir were removed from the underlying subcutaneous tissue in their entirety. Securing sutures were also removed. The pocket was irrigated with a copious amount of sterile normal saline. The pocket was closed with interrupted 3-0 Vicryl stitches. The subcutaneous tissue was closed with 3-0 Vicryl interrupted subcutaneous stitches. A 4-0 Vicryl running subcuticular stitch was utilized to approximate the skin. Dermabond was  applied.  IMPRESSION: Successful left IJ vein Port-A-Cath explant. There is a possibility of pocket infection. The patient was instructed to contact us immediately if erythema or swelling increases at the port site. If this occurs, the site may need to be opened and packed with iodoform gauze.   Electronically Signed   By: Marybelle Killings M.D.   On: 01/08/2015 14:33    ASSESSMENT/PLAN:    Breast cancer of upper-outer quadrant of right female breast Last received cycle 3, day 8 of her Abraxane chemo on 12/28/14.    Pt returns tomorrow 01/08/15 for planned removal of portacath and placement of a PICC line.   Hypersensitivity reaction Pt has developed an obvious portacath infection; and presented to the Cancer center today for a vancomycin infusion.Pt's left upper chest portacath site and left neck area with eythema and edema. Pt had received approx 2/3 of the infusion- when she developed "Red Man Syndrome" with generalized erythema to generalized body.  C/o slight itching; but no obvious hives or resp issues. Vitals remained stable throughout.   Vancomycin infusion held; and pt received benadryl, pepcid, and solumedrol.  Symptoms did eventually resolve- but decision was made per Dr. Alen Blew (on call) to hold any further vancomycin today.   Pt is scheduled to return tomorrow for removal of the portacath and placement of PICC line.  She also plans to receive additional vancomycin at that time.   Will follow up with Dr. [name] in the morning to confirm that pt receive premeds prior to the vancomycin; and may also need to consider infusing the vancomycin much slower as well.   Patient stated understanding of all instructions; and was in agreement with this plan of care. The patient knows to call the clinic with any problems, questions or concerns.   Review/collaboration with Dr. Alen Blew (on call) regarding all aspects of patient's visit today.   Total time spent with patient was 40 minutes;  with greater  than 75 percent of that time spent in face to face counseling regarding patient's symptoms,  and coordination of care and follow up.  Disclaimer: This note was dictated with voice recognition software. Similar sounding words can inadvertently be transcribed and may not be corrected upon review.   Drue Second, NP 01/10/2015

## 2015-01-11 ENCOUNTER — Ambulatory Visit (HOSPITAL_BASED_OUTPATIENT_CLINIC_OR_DEPARTMENT_OTHER): Payer: BLUE CROSS/BLUE SHIELD | Admitting: Nurse Practitioner

## 2015-01-11 ENCOUNTER — Other Ambulatory Visit (HOSPITAL_BASED_OUTPATIENT_CLINIC_OR_DEPARTMENT_OTHER): Payer: BLUE CROSS/BLUE SHIELD

## 2015-01-11 ENCOUNTER — Other Ambulatory Visit: Payer: Self-pay | Admitting: Oncology

## 2015-01-11 ENCOUNTER — Ambulatory Visit: Payer: BLUE CROSS/BLUE SHIELD

## 2015-01-11 ENCOUNTER — Encounter: Payer: Self-pay | Admitting: Nurse Practitioner

## 2015-01-11 VITALS — BP 118/75 | HR 94 | Temp 98.3°F | Resp 18 | Ht 67.0 in | Wt 169.4 lb

## 2015-01-11 DIAGNOSIS — C7971 Secondary malignant neoplasm of right adrenal gland: Secondary | ICD-10-CM

## 2015-01-11 DIAGNOSIS — C787 Secondary malignant neoplasm of liver and intrahepatic bile duct: Secondary | ICD-10-CM | POA: Diagnosis not present

## 2015-01-11 DIAGNOSIS — C7801 Secondary malignant neoplasm of right lung: Secondary | ICD-10-CM

## 2015-01-11 DIAGNOSIS — C50411 Malignant neoplasm of upper-outer quadrant of right female breast: Secondary | ICD-10-CM

## 2015-01-11 DIAGNOSIS — C778 Secondary and unspecified malignant neoplasm of lymph nodes of multiple regions: Secondary | ICD-10-CM

## 2015-01-11 DIAGNOSIS — B373 Candidiasis of vulva and vagina: Secondary | ICD-10-CM

## 2015-01-11 DIAGNOSIS — T80212D Local infection due to central venous catheter, subsequent encounter: Secondary | ICD-10-CM

## 2015-01-11 DIAGNOSIS — C7931 Secondary malignant neoplasm of brain: Secondary | ICD-10-CM

## 2015-01-11 DIAGNOSIS — C7802 Secondary malignant neoplasm of left lung: Secondary | ICD-10-CM

## 2015-01-11 DIAGNOSIS — B3731 Acute candidiasis of vulva and vagina: Secondary | ICD-10-CM

## 2015-01-11 DIAGNOSIS — Z171 Estrogen receptor negative status [ER-]: Secondary | ICD-10-CM

## 2015-01-11 LAB — CBC WITH DIFFERENTIAL/PLATELET
BASO%: 0.5 % (ref 0.0–2.0)
BASOS ABS: 0 10*3/uL (ref 0.0–0.1)
EOS ABS: 0 10*3/uL (ref 0.0–0.5)
EOS%: 0.5 % (ref 0.0–7.0)
HEMATOCRIT: 41.9 % (ref 34.8–46.6)
HGB: 13.9 g/dL (ref 11.6–15.9)
LYMPH#: 0.9 10*3/uL (ref 0.9–3.3)
LYMPH%: 9.2 % — AB (ref 14.0–49.7)
MCH: 35 pg — AB (ref 25.1–34.0)
MCHC: 33.2 g/dL (ref 31.5–36.0)
MCV: 105.4 fL — AB (ref 79.5–101.0)
MONO#: 0.2 10*3/uL (ref 0.1–0.9)
MONO%: 2.5 % (ref 0.0–14.0)
NEUT#: 8.1 10*3/uL — ABNORMAL HIGH (ref 1.5–6.5)
NEUT%: 87.3 % — AB (ref 38.4–76.8)
PLATELETS: 186 10*3/uL (ref 145–400)
RBC: 3.97 10*6/uL (ref 3.70–5.45)
RDW: 16.7 % — ABNORMAL HIGH (ref 11.2–14.5)
WBC: 9.3 10*3/uL (ref 3.9–10.3)

## 2015-01-11 LAB — COMPREHENSIVE METABOLIC PANEL (CC13)
ALT: 12 U/L (ref 0–55)
ANION GAP: 10 meq/L (ref 3–11)
AST: 11 U/L (ref 5–34)
Albumin: 3.9 g/dL (ref 3.5–5.0)
Alkaline Phosphatase: 99 U/L (ref 40–150)
BUN: 10.3 mg/dL (ref 7.0–26.0)
CALCIUM: 9.2 mg/dL (ref 8.4–10.4)
CHLORIDE: 104 meq/L (ref 98–109)
CO2: 26 mEq/L (ref 22–29)
CREATININE: 0.7 mg/dL (ref 0.6–1.1)
Glucose: 90 mg/dl (ref 70–140)
POTASSIUM: 3.5 meq/L (ref 3.5–5.1)
Sodium: 141 mEq/L (ref 136–145)
Total Bilirubin: 0.85 mg/dL (ref 0.20–1.20)
Total Protein: 6.7 g/dL (ref 6.4–8.3)

## 2015-01-11 LAB — CATH TIP CULTURE: Culture: NO GROWTH

## 2015-01-11 MED ORDER — FLUCONAZOLE 150 MG PO TABS: 150.0000 mg | ORAL_TABLET | Freq: Every day | ORAL | Status: DC

## 2015-01-11 MED ORDER — NYSTATIN 100000 UNIT/GM EX CREA: 1.0000 "application " | TOPICAL_CREAM | Freq: Two times a day (BID) | CUTANEOUS | Status: DC

## 2015-01-11 NOTE — Progress Notes (Signed)
And a Patient ID: Nicole Valentine, female   DOB: Apr 21, 1966, 49 y.o.   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 She had a mammogram performed that showed dense breasts bilaterally. Ultrasound of the right breast showed 2.3 x 1.9 cm area of abnormality with multiple abnormal lymph nodes. MRI of the bilateral breasts showed asymmetrical enhancement throughout the right breast consistent with multicentric disease. An ultrasound-guided biopsy performed. The known area of disease measured 1.9 x 1.9 x 2.3 cm. Multiple abnormal positive right axillary lymph nodes were noted. 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 Biopsy of the right axillary lymph node was positive for malignancy with extracapsular extension. Patient was originally seen by Dr. Margot Valentine Dr. Truddie Valentine and Dr. Pablo Valentine. She has elected to have a right mastectomy eventually and declined biopsies of any other areas within the breast."  She went on to receive neoadjuvant chemotherapy and attained a complete pathologic response, as documented below  Nicole Valentine noted a very small right supraclavicular mass 09/25/2014, which she brought to our attention. She was having some allergy symptoms at the time so we decided to reevaluate this after a few weeks and on 10/22/2014 as the mass persisted we obtained a restaging neck CT scan, 10/28/2014. There was bilateral supraclavicular adenopathy, but also a large right pleural effusion was incidentally noted. We proceeded to right  thoracentesis 10/29/2014, and a liter of hazy yellow fluid was removed. Cytology from this procedure (Nicole Valentine [id number]) showed malignant cells consistent with adenocarcinoma, estrogen and progesterone receptor negative, HER-2 not amplified, with a signals ratio 1.12 and the number per cell being 2.75, and with an MIB-1 of 80%. I called Nicole Valentine with these results and set her up for a PET scan performed 11/06/2014, which shows widespread metastatic disease involving particularly the right lung, liver, and bones, but also the left lung, right adrenal, and multiple lymph node areas.  Her subsequent history is as detailed below.  INTERVAL HISTORY: Nicole Valentine returns today for follow-up of her metastatic breast cancer. The interval history is remarkable for a left chest port site infection. 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 She simply had the port removed and was started on zyvox PO. The zyvox however seems to be causing a vaginal yeast infection. She denies odor, and confirms vulvar itching and burning with minimal discharge.   REVIEW OF S/YSTEMS: Nicole Valentine denies fevers, chills, nausea, vomiting, or changes in bowel or bladder habits. She previously tolerated the abraxane well, with fatigue as her only side effect. She denies mouth sores or neuropathy symptom. She has no shortness of breath, chest pain, cough, or palpitations. A detailed review of systems is otherwise stable.  PAST MEDICAL HISTORY: Past Medical History  Diagnosis Date  . Complication of anesthesia     her father and uncle both had very difficult time waking up-was  something they told them may be hereditory. she will find out.  [name]  Hypothyroidism   . 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 Radiation 01/21/13-03/06/13    Right Breast  . Breast cancer dx'd 04-10-12-rt     PAST SURGICAL HISTORY: Past Surgical History  Procedure Laterality Date  . Wisdom tooth extraction    . Portacath placement  04/22/2012    Procedure: INSERTION PORT-A-CATH;  Surgeon: Nicole Lasso, MD;  Location: [facility];  Service: General;  Laterality: Left;  [name] Modified mastectomy Right 11/18/2012    Procedure:  RIGHT MODIFIED MASTECTOMY;  Surgeon: Nicole Lasso, MD;  Location: [facility];  Service: General;  Laterality: Right;  . Knee arthroscopy Right 11/26/2013  . Port-a-cath removal Left 12/10/2013    Procedure: MINOR REMOVAL PORT-A-CATH;  Surgeon: Nicole Hector, MD;  Location: [facility];  Service: General;  Laterality: Left;    FAMILY HISTORY Family History  Problem Relation Age of Onset  . Lung cancer Maternal Grandfather 51    smoker  . Prostate cancer Maternal Grandfather 90  . Throat cancer Other     Great Aunt x 2  . Liver cancer Other     Maternal Great Grandmother  . Melanoma Maternal Uncle 81  . Brain cancer Cousin 11    non-malignant  the patient's parents are living, in their early 18s. The patient has 2 brothers, no sisters. The patient's mother was diagnosed with breast cancer, HER-2 positive, in 2014 in [place].  GYNECOLOGIC HISTORY:   (Updated 10/03/2013) Menarche age 13, first live birth age 89. She is GX P4. LMP January 2014. 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  The children are currently ages 19, 36, 44, and 3. Her husband, Nicole Valentine, is a Customer service manager.He just started a job for [employer].  They attend the [employer] DIRECTIVES: The patient's husband is her HCPOA   HEALTH MAINTENANCE:  (Updated 10/03/2013) Social History  Substance Use Topics  . Smoking status: Never Smoker   . Smokeless tobacco: Never Used  . Alcohol Use: No     Colonoscopy: Never  PAP:  November 2013/Nicole Valentine  Bone density: January 2015, Nicole Valentine, normal  Lipid panel:  Not on file  Allergies  Allergen Reactions  . Almond Meal Rash  . Almond Oil Rash  . Other Rash    LOTIONS  . Tegaderm Ag Mesh [Silver] Rash  . Vancomycin Rash    Red Man Syndrome    Current Outpatient Prescriptions  Medication Sig Dispense Refill  . cholecalciferol (VITAMIN D) 1000 UNITS tablet Take 2,000 Units by mouth 2 (two) times daily.     [name] levothyroxine (SYNTHROID, LEVOTHROID) 175 MCG tablet Take 175 mcg by mouth daily before breakfast.    . linezolid (ZYVOX) 600 MG tablet Take 1 tablet (600 mg total) by mouth 2 (two) times daily. 20 tablet 0  . saccharomyces boulardii (FLORASTOR) 250 MG capsule Take 1 capsule (250 mg total) by mouth 2 (two) times daily. (Patient taking differently: Take 750-1,000 mg by mouth as directed. Patient alternates 3 caps one day the next she takes 4) 60 capsule 2  . fluconazole (DIFLUCAN) 150 MG tablet Take 1 tablet (150 mg total) by mouth daily. Take 1 tablet today. May repeat dose once in 3 days. 2 tablet 0  . ibuprofen (ADVIL,MOTRIN) 200 MG tablet Take 400 mg by mouth every 6 (six) hours as needed for  headache or moderate pain.    . Ibuprofen-Diphenhydramine HCl 200-25 MG CAPS Take 1 capsule by mouth at bedtime as needed (sleep).    . Loratadine-Pseudoephedrine (CLARITIN-D 12 HOUR PO) Take 1 tablet by mouth 2 (two) times daily as needed (allergies).    . LORazepam (ATIVAN) 0.5 MG tablet Take 0.5 mg by mouth at bedtime as needed for sleep.   0  . nystatin cream (MYCOSTATIN) Apply 1 application topically 2 (two) times daily. 30 g 0  . ondansetron (ZOFRAN) 8 MG tablet Take 1 tablet (8 mg total) by mouth 2 (two) times daily. Start the day after chemo for 2 days. Then take as needed for nausea or vomiting. (Patient not taking: Reported on 01/11/2015) 30 tablet 1  . prochlorperazine (COMPAZINE) 10 MG tablet Take 1 tablet (10 mg total) by mouth every 6 (six) hours as needed  (Nausea or vomiting). (Patient not taking: Reported on 01/11/2015) 30 tablet 1   No current facility-administered medications for this visit.   Facility-Administered Medications Ordered in Other Visits  Medication Dose Route Frequency Provider Last Rate Last Dose  . sodium chloride 0.9 % injection 10 mL  10 mL Intracatheter PRN Chauncey Cruel, MD   10 mL at 12/28/14 1141  . sodium chloride 0.9 % injection 3 mL  3 mL Intravenous PRN Chauncey Cruel, MD      . vancomycin (VANCOCIN) 1,250 mg in sodium chloride 0.9 % 500 mL IVPB  1,250 mg Intravenous Once Chauncey Cruel, MD          OBJECTIVE: Middle-aged white woman in no acute distress Filed Vitals:   01/11/15 1353  BP: 118/75  Pulse: 94  Temp: 98.3 F (36.8 C)  Resp: 18     Body mass index is 26.53 kg/(m^2).    ECOG FS:1 - Symptomatic but completely ambulatory Filed Weights   01/11/15 1353  Weight: 169 lb 6.4 oz (76.839 kg)    Skin: former left chest port site covered in bandage. Erythematous areas to the left and right bandage likely from tape used after port removal. Otherwise erythema no longer present on left neck or most of left chest.  HEENT: sclerae anicteric, conjunctivae pink, oropharynx clear. No thrush or mucositis.  Lymph Nodes: No cervical or supraclavicular lymphadenopathy  Lungs: clear to auscultation bilaterally, no rales, wheezes, or rhonci  Heart: regular rate and rhythm  Abdomen: round, soft, non tender, positive bowel sounds  Musculoskeletal: No focal spinal tenderness, no peripheral edema  Neuro: non focal, well oriented, positive affect  Breasts: deferred   Photo 01/06/2015 at 1728 PM     LAB RESULTS:   Lab Results  Component Value Date   WBC 9.3 01/11/2015   NEUTROABS 8.1* 01/11/2015   HGB 13.9 01/11/2015   HCT 41.9 01/11/2015   MCV 105.4* 01/11/2015   PLT 186 01/11/2015      Chemistry      Component Value Date/Time   NA 141 01/11/2015 1340   NA 137 01/08/2015 1020   K 3.5  01/11/2015 1340   K 3.9 01/08/2015 1020   CL 107 01/08/2015 1020   CL 101 10/18/2012 0859   CO2 26 01/11/2015 1340   CO2 23 01/08/2015 1020   BUN 10.3 01/11/2015 1340   BUN 13 01/08/2015 1020   CREATININE 0.7 01/11/2015 1340   CREATININE 0.69 01/08/2015 1020      Component Value Date/Time   CALCIUM 9.2 01/11/2015 1340   CALCIUM 9.0 01/08/2015 1020   ALKPHOS 99 01/11/2015 1340  ALKPHOS 94 11/17/2014 2205   AST 11 01/11/2015 1340   AST 22 11/17/2014 2205   ALT 12 01/11/2015 1340   ALT 20 11/17/2014 2205   BILITOT 0.85 01/11/2015 1340   BILITOT 0.6 11/17/2014 2205       STUDIES: Nm Pet Image Restag (ps) Skull Base To Thigh  01/05/2015   CLINICAL DATA:  Subsequent treatment strategy for breast cancer.  EXAM: NUCLEAR MEDICINE PET SKULL BASE TO THIGH  TECHNIQUE: 8.47 mCi F-18 FDG was injected intravenously. Full-ring PET imaging was performed from the skull base to thigh after the radiotracer. CT data was obtained and used for attenuation correction and anatomic localization.  FASTING BLOOD GLUCOSE:  Value: 94 mg/dl  COMPARISON:  11/18/2014  FINDINGS: NECK  Resolution lower cervical hypermetabolic lymphadenopathy. A few tiny scattered lymph nodes are noted.  CHEST  A left-sided Port-A-Cath is in place. Status post right mastectomy and right lymph node dissection. No residual right axillary lymphadenopathy. No hypermetabolism. No left-sided breast mass or left-sided adenopathy.  Resolution of hypermetabolic mediastinal and hilar lymphadenopathy. There is some residual fullness in the right hilum but no hypermetabolism.  There is a persistent right-sided pleural effusion. Focus of hypermetabolism in the anterior aspect of the right upper lobe is most likely radiation change. No persistent hypermetabolic pulmonary metastatic disease. Persistent moderate interstitial thickening in the right lower lobe. No new lung lesions.  ABDOMEN/PELVIS  Near complete resolution of hepatic metastatic disease.  There is a faintly metabolically active lesion in segment 4 a. SUV max is 4.5. The adrenal metastatic lesions have resolved. No enlarged or hypermetabolic abdominal/pelvic lymphadenopathy.  SKELETON  Diffuse osseous uptake is likely due to rebound from chemotherapy or marrow stimulating drugs.  IMPRESSION: 1. Excellent response to therapy is demonstrated with resolution of neck and chest lymphadenopathy. The pulmonary metastatic disease is markedly improved. Near complete resolution of liver metastasis and adrenal gland metastasis. 2. Hypermetabolic area in the right upper lobe may be related to radiation change. 3. One residual mildly hypermetabolic liver metastasis. 4. Diffuse osseous uptake is likely due to rebound or marrow stimulating drugs. 5. Persistent right pleural effusion.   Electronically Signed   By: Marijo Sanes M.D.   On: 01/05/2015 10:14   Ir Removal [employer] Access W/ Port W/o Fl Mod Sed  01/08/2015   CLINICAL DATA:  Infected left jugular Port-A-Cath. Examination of the left chest demonstrates erythema without evidence of pus. Examination of the port site demonstrates no evidence of fluctuance.  EXAM: REMOVAL RIGHT IJ VEIN PORT-A-CATH  PROCEDURE: The left chest was prepped and draped in a sterile fashion. Lidocaine was utilized for local anesthesia. An incision was made over the previously healed surgical incision. There is no evidence of pus or fluid within the port pocket. Hopefully, there is only cellulitis in the overlying skin. Therefore, the plan is to close the incision by primary intention. Utilizing blunt dissection, the port catheter and reservoir were removed from the underlying subcutaneous tissue in their entirety. Securing sutures were also removed. The pocket was irrigated with a copious amount of sterile normal saline. The pocket was closed with interrupted 3-0 Vicryl stitches. The subcutaneous tissue was closed with 3-0 Vicryl interrupted subcutaneous stitches. A 4-0 Vicryl running  subcuticular stitch was utilized to approximate the skin. Dermabond was applied.  IMPRESSION: Successful left IJ vein Port-A-Cath explant. There is a possibility of pocket infection. The patient was instructed to contact us immediately if erythema or swelling increases at the port site. If this occurs, the  site may need to be opened and packed with iodoform gauze.   Electronically Signed   By: Marybelle Killings M.D.   On: 01/08/2015 14:33    ASSESSMENT: 49 y.o. BRCA negative [place] woman with triple-negative stage IV breast cancer  (1) an status post right breast upper outer quadrant and right axillary lymph node biopsy 04/04/2012, both positive for an invasive ductal carcinoma, grade 3, triple negative, with an MIB-1 of 25%  (2) Treated neoadjuvantly with  (a) fluorouracil, cyclophosphamide, and epirubicin (at 100 mg/M2) x4 completed 06/07/2012  (b) docetaxel (75 mg/M2) for one dose, 06/21/2012, poorly tolerated  (c) carboplatin and gemcitabine given every 21 days for 6 cycles completed 10/11/2012  (3) status post right modified radical mastectomy 11/18/2012 showing a complete pathologic response--all 16 lymph nodes were benign  (a) no plans for reconstructon  (4) adjuvant radiation therapy completed 03/06/2013  METASTATIC DISEASE June 2016 (5) 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, 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  (6) staging PET scan 11/06/2014 showed significant disease in the middle and lower lobes of the Right lung, Right effusion, multiple liver and bone lesions, as well as left lung nodules, a Right adrenal nodule, and widespread hypermetabolic adenopathy  (a) brain MRI 11/16/2014 showed multiple cerebellar lesions  (7) abraxane day 1 and day 8 of each 21 day cycle started 11/09/2014  (a) PET scan 01/05/2015 shows an excellent initial response after 3 cycles  (8) zolendronate started 11/16/2014, to be repeated every 12 weeks  (9) Foundation 1 study  sent  11/06/2014: patient's sister in law Garvin Fila following [phone])  (a) 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[place], NTRK1, MYC, ARID1A and LYN, none w approved therapies  (b) pazopanib, ponatinib or crizotinib suggested as possible off-protocol options  (10) Brain metastases:  (a) s/p SRS therapy 12/11/2014 to 4 right cerebellar lesions  PLAN: Quintasia is doing well today. Her former port site continues to heal. She will continue zyvox as prescribed. It is, however, causing vaginal yeast symptoms, both internally and externally. I sent a prescription for fluconazole 120m into her pharmacy today, and she may repeat the dose x1 in 3 days if necessary. For her vulvar symptoms she will use nystatin cream BID, until resolution.   Dr. MJana Hakimwas consulted, and he suggests holding treatment this week, while she is being treated for her port infeciton. JKaleiahwill return in 1 week to start cycle 4. She understands and agrees with this plan. She knows the goal of treatment in her case is control. She has been encouraged to call with any issues that might arise before her next visit here.   HLaurie Panda NP   01/11/2015 2:26 PM

## 2015-01-12 ENCOUNTER — Telehealth: Payer: Self-pay | Admitting: Nurse Practitioner

## 2015-01-12 ENCOUNTER — Encounter: Payer: Self-pay | Admitting: Oncology

## 2015-01-12 ENCOUNTER — Other Ambulatory Visit: Payer: BLUE CROSS/BLUE SHIELD

## 2015-01-12 LAB — CULTURE, BLOOD (SINGLE)

## 2015-01-12 NOTE — Telephone Encounter (Signed)
Per 08/22 POF added chemo to pt's schedule.... KJ

## 2015-01-13 ENCOUNTER — Other Ambulatory Visit: Payer: Self-pay | Admitting: Oncology

## 2015-01-13 ENCOUNTER — Ambulatory Visit (HOSPITAL_COMMUNITY)
Admission: RE | Admit: 2015-01-13 | Discharge: 2015-01-13 | Disposition: A | Discharge status: Home / Self Care | Payer: BLUE CROSS/BLUE SHIELD | Source: Ambulatory Visit | Attending: Oncology | Admitting: Oncology

## 2015-01-13 ENCOUNTER — Telehealth: Payer: Self-pay | Admitting: *Deleted

## 2015-01-13 ENCOUNTER — Other Ambulatory Visit: Payer: Self-pay | Admitting: *Deleted

## 2015-01-13 ENCOUNTER — Encounter (HOSPITAL_COMMUNITY): Payer: Self-pay

## 2015-01-13 DIAGNOSIS — C50411 Malignant neoplasm of upper-outer quadrant of right female breast: Secondary | ICD-10-CM

## 2015-01-13 DIAGNOSIS — C50919 Malignant neoplasm of unspecified site of unspecified female breast: Secondary | ICD-10-CM

## 2015-01-13 DIAGNOSIS — Z853 Personal history of malignant neoplasm of breast: Secondary | ICD-10-CM | POA: Insufficient documentation

## 2015-01-13 DIAGNOSIS — C7951 Secondary malignant neoplasm of bone: Secondary | ICD-10-CM

## 2015-01-13 DIAGNOSIS — J9811 Atelectasis: Secondary | ICD-10-CM | POA: Insufficient documentation

## 2015-01-13 DIAGNOSIS — M549 Dorsalgia, unspecified: Secondary | ICD-10-CM | POA: Diagnosis not present

## 2015-01-13 DIAGNOSIS — J910 Malignant pleural effusion: Secondary | ICD-10-CM

## 2015-01-13 DIAGNOSIS — R079 Chest pain, unspecified: Secondary | ICD-10-CM | POA: Insufficient documentation

## 2015-01-13 MED ORDER — IOHEXOL 350 MG/ML SOLN
100.0000 mL | Freq: Once | INTRAVENOUS | Status: AC | PRN
  Administered 2015-01-13: 100 mL via INTRAVENOUS

## 2015-01-13 NOTE — Progress Notes (Unsigned)
Nicole Valentine saw Dr. [name] at [facility] 01/07/2015. He felt when she progresses on her current therapy they can consider the phase 3 chemotherapy note trial with Pembrolizumab monotherapy, or [id number] (phase 1B/2) with a rib U Lynn, or CD X-011 if her triple negative breast cancer expresses G PN MB. He would suggest the trials in that order, of course with a per my so that they should still be open at the time of progression

## 2015-01-13 NOTE — Telephone Encounter (Signed)
Patient called and left message about increasing back pain.  The same issue that she had emailed about yesterday.  Discussed with Marlon Pel RN and she will call patient.

## 2015-01-13 NOTE — Telephone Encounter (Signed)
This RN spoke with pt per her call regarding onset of Left side pain "which is like in May when I had the fluid in my lung on the right "  Lazaria denies any fevers, changes or discomfort in urine ( pt had UTI approximately 1 week ago ).  She states " the left side is even tender to touch "  No rashes or discoloration on area of discomfort.  Pain has been present since Monday but increasing in discomfort.  Plan per above is pt will obtain CXR today and then see this RN for review and recommendations.

## 2015-01-15 ENCOUNTER — Ambulatory Visit: Payer: Self-pay | Admitting: Radiation Oncology

## 2015-01-15 ENCOUNTER — Ambulatory Visit
Admission: RE | Admit: 2015-01-15 | Discharge: 2015-01-15 | Disposition: A | Discharge status: Home / Self Care | Payer: BLUE CROSS/BLUE SHIELD | Source: Ambulatory Visit | Attending: Radiation Oncology | Admitting: Radiation Oncology

## 2015-01-15 ENCOUNTER — Encounter: Payer: Self-pay | Admitting: Radiation Oncology

## 2015-01-15 DIAGNOSIS — C7951 Secondary malignant neoplasm of bone: Secondary | ICD-10-CM | POA: Insufficient documentation

## 2015-01-15 NOTE — Progress Notes (Addendum)
Radiation Oncology         [phone] ________________________________  Outpatient Re-Consultation  Name: Nicole Valentine MRN: [id number]  Date: 01/15/2015  DOB: [birth year]  VO:[id number] Weyman Croon, MD  Consuela Mimes, MD   REFERRING PHYSICIAN: Consuela Mimes, MD  DIAGNOSIS:    ICD-9-CM ICD-10-CM   1. Bone metastases 198.5 C79.51    Metastatic Breast cancer  Indication for treatment:  palliative       Most recent Radiation treatment dates:   12/11/2014  Site/dose/Beams:   Right Cerebellar Tonsil 12mm target was treated using 4 Circular Arcs to a prescription dose of 20 Gy.  ExacTrac Snap verification was performed for each couch angle.  Right Cerebellar Midline 50mm target was treated using 3 Circular Arcs to a prescription dose of 20 Gy.  ExacTrac Snap verification was performed for each couch angle.  Right Cerebellar Hemis 51mm target was treated using 4 Circular Arcs to a prescription dose of 20 Gy.  ExacTrac Snap verification was performed for each couch angle.  Right Inf Cerebellar Hemis 84mm target was treated using 4 Circular Arcs to a prescription dose of 20 Gy.  ExacTrac Snap verification was performed for each couch angle.  HISTORY OF PRESENT ILLNESS::Nicole Valentine is a 49 y.o. female who presented with 1 month f/u after SRS treatment to her brain. She's doing well after this procedure with the exception of left mid-back pain that started on Monday. Radiation down back and towards hips bilaterally. She denies any other bodily pain. She had a CT scan on 8/24 which showed "presumed metastatic disease in the superior T9 vertebral body." She started taking decadron on Wednesday, 2 tablets BID, and said the pain improved. She denies dizziness, headaches, balance issues, vision changes, memory issues, and extremity weakness. She reports having nausea the first day after SRS but none since. She reports having slight fatigue. She had chemotherapy on 12/28/14 and will have it again on  Monday. She reported a sore throat and sores on the corner of her mouth to Dr. Jana Valentine who then prescribed 2 tablets of diflucan which relived her symptoms. Her port-a-cath was removed on 12/11/14 due to an infection and she will have another put in it's place in the future.  Initial PET scan from June 2016 demonstrated diffuse hypermetabolic activity in the T9 vertebral body with a SUV max of 15.4. At that time, she did demonstrate sclerosis of that area. Her PET scan on 8/16, showed diffuse osseous intake that is consistent with marrow stimulating drugs. Her visceral disease showed excellent response to treatment. CT of chest shows sclerotic T9 vertebral lesion this month.  PREVIOUS RADIATION THERAPY: Yes.  01/21/2013-03/06/2013: (Right chest wall / 50.4 Gray @ 1.8 Gray per fraction x 28 fractions. Right supraclavicular fossa / 45 Gy @1 .8 Gy per fraction x 25 fractions. Right scar boost / 10 Gray at 2 [place] per fraction x 5 fractions)  12/11/2014: Right Cerebellar Tonsil 65mm target was treated using 4 Circular Arcs to a prescription dose of 20 Gy.  ExacTrac Snap verification was performed for each couch angle. Right Cerebellar Midline 35mm target was treated using 3 Circular Arcs to a prescription dose of 20 Gy.  ExacTrac Snap verification was performed for each couch angle. Right Cerebellar Hemis 63mm target was treated using 4 Circular Arcs to a prescription dose of 20 Gy.  ExacTrac Snap verification was performed for each couch angle. Right Inf Cerebellar Hemis 33mm target was treated using 4 Circular Arcs to a prescription dose of 20 Gy.  ExacTrac Snap verification was performed for each couch angle.  PAST MEDICAL HISTORY:  has a past medical history of Complication of anesthesia; Hypothyroidism; Allergy; Radiation (01/21/13-03/06/13); and Breast cancer (dx'd 04-10-12-rt).    PAST SURGICAL HISTORY: Past Surgical History  Procedure Laterality Date  . Wisdom tooth extraction    . Portacath placement   04/22/2012    Procedure: INSERTION PORT-A-CATH;  Surgeon: [name], MD;  Location: [place];  Service: General;  Laterality: Left;  [name] Modified mastectomy Right 11/18/2012    Procedure:  RIGHT MODIFIED MASTECTOMY;  Surgeon: [name], MD;  Location: [place];  Service: General;  Laterality: Right;  . Knee arthroscopy Right 11/26/2013  . Port-a-cath removal Left 12/10/2013    Procedure: MINOR REMOVAL PORT-A-CATH;  Surgeon: [name], MD;  Location: [place];  Service: General;  Laterality: Left;    FAMILY HISTORY: family history includes Brain cancer (age of onset: 3) in her cousin; Liver cancer in her other; Lung cancer (age of onset: 102) in her maternal grandfather; Melanoma (age of onset: 37) in her maternal uncle; Prostate cancer (age of onset: 26) in her maternal grandfather; Throat cancer in her other.  SOCIAL HISTORY:  reports that she has never smoked. She has never used smokeless tobacco. She reports that she does not drink alcohol or use illicit drugs.  ALLERGIES: Almond meal; Almond oil; Other; Tegaderm ag mesh; and Vancomycin  MEDICATIONS:  Current Outpatient Prescriptions  Medication Sig Dispense Refill  . dexamethasone (DECADRON) 4 MG tablet Take 4 mg by mouth 2 (two) times daily with a meal. Takes 2 tablets bid    . levothyroxine (SYNTHROID, LEVOTHROID) 175 MCG tablet Take 175 mcg by mouth daily before breakfast.    . linezolid (ZYVOX) 600 MG tablet Take 1 tablet (600 mg total) by mouth 2 (two) times daily. 20 tablet 0  . loratadine (CLARITIN) 10 MG tablet Take 10 mg by mouth daily.    [name] nystatin cream (MYCOSTATIN) Apply 1 application topically 2 (two) times daily. 30 g 0  . saccharomyces boulardii (FLORASTOR) 250 MG capsule Take 1 capsule (250 mg total) by mouth 2 (two) times daily. (Patient taking differently: Take 750-1,000 mg by mouth as directed. Patient alternates 3 caps one day the next she takes 4) 60  capsule 2  . cholecalciferol (VITAMIN D) 1000 UNITS tablet Take 2,000 Units by mouth 2 (two) times daily.     . fluconazole (DIFLUCAN) 150 MG tablet Take 1 tablet (150 mg total) by mouth daily. Take 1 tablet today. May repeat dose once in 3 days. (Patient not taking: Reported on 01/15/2015) 2 tablet 0  . ibuprofen (ADVIL,MOTRIN) 200 MG tablet Take 400 mg by mouth every 6 (six) hours as needed for headache or moderate pain.    . Ibuprofen-Diphenhydramine HCl 200-25 MG CAPS Take 1 capsule by mouth at bedtime as needed (sleep).    . Loratadine-Pseudoephedrine (CLARITIN-D 12 HOUR PO) Take 1 tablet by mouth 2 (two) times daily as needed (allergies).    . LORazepam (ATIVAN) 0.5 MG tablet Take 0.5 mg by mouth at bedtime as needed for sleep.   0  . ondansetron (ZOFRAN) 8 MG tablet Take 1 tablet (8 mg total) by mouth 2 (two) times daily. Start the day after chemo for 2 days. Then take as needed for nausea or vomiting. (Patient not taking: Reported on 01/11/2015) 30 tablet 1  . prochlorperazine (COMPAZINE) 10 MG tablet Take 1 tablet (10 mg total) by  mouth every 6 (six) hours as needed (Nausea or vomiting). (Patient not taking: Reported on 01/11/2015) 30 tablet 1   No current facility-administered medications for this encounter.   Facility-Administered Medications Ordered in Other Encounters  Medication Dose Route Frequency Provider Last Rate Last Dose  . sodium chloride 0.9 % injection 10 mL  10 mL Intracatheter PRN Chauncey Cruel, MD   10 mL at 12/28/14 1141  . sodium chloride 0.9 % injection 3 mL  3 mL Intravenous PRN Chauncey Cruel, MD      . vancomycin (VANCOCIN) 1,250 mg in sodium chloride 0.9 % 500 mL IVPB  1,250 mg Intravenous Once Chauncey Cruel, MD        REVIEW OF SYSTEMS:  Notable for that above.   PHYSICAL EXAM:  height is 5\' 7"  (1.702 m) and weight is 166 lb 11.2 oz (75.615 kg). Her oral temperature is 97.9 F (36.6 C). Her blood pressure is 126/75 and her pulse is 62. Her respiration  is 12 and oxygen saturation is 100%.   General: Alert and oriented, in no acute distress HEENT: Head is normocephalic. Extraocular movements are intact. Oropharynx is clear. No thrush Skin: No concerning lesions. Infection appears to have resolved at [facility] site. Musculoskeletal: symmetric strength and muscle tone throughout. Neurologic: Cranial nerves II through XII are grossly intact. No obvious focalities. Speech is fluent.   Psychiatric: Judgment and insight are intact. Affect is appropriate. Back: Some reproducible tenderness in the left paraspinal region in the low thoracic back.  ECOG = 1  0 - Asymptomatic (Fully active, able to carry on all predisease activities without restriction)  1 - Symptomatic but completely ambulatory (Restricted in physically strenuous activity but ambulatory and able to carry out work of a light or sedentary nature. For example, light housework, office work)  2 - Symptomatic, <50% in bed during the day (Ambulatory and capable of all self care but unable to carry out any work activities. Up and about more than 50% of waking hours)  3 - Symptomatic, >50% in bed, but not bedbound (Capable of only limited self-care, confined to bed or chair 50% or more of waking hours)  4 - Bedbound (Completely disabled. Cannot carry on any self-care. Totally confined to bed or chair)  5 - Death   Eustace Pen MM, Creech RH, Tormey DC, et al. [phone]). "Toxicity and response criteria of the [facility]". [place] Oncol. 5 (6): 649-55   LABORATORY DATA:  Lab Results  Component Value Date   WBC 9.3 01/11/2015   HGB 13.9 01/11/2015   HCT 41.9 01/11/2015   MCV 105.4* 01/11/2015   PLT 186 01/11/2015   CMP     Component Value Date/Time   NA 141 01/11/2015 1340   NA 137 01/08/2015 1020   K 3.5 01/11/2015 1340   K 3.9 01/08/2015 1020   CL 107 01/08/2015 1020   CL 101 10/18/2012 0859   CO2 26 01/11/2015 1340   CO2 23 01/08/2015 1020   GLUCOSE 90  01/11/2015 1340   GLUCOSE 119* 01/08/2015 1020   GLUCOSE 83 10/18/2012 0859   BUN 10.3 01/11/2015 1340   BUN 13 01/08/2015 1020   CREATININE 0.7 01/11/2015 1340   CREATININE 0.69 01/08/2015 1020   CALCIUM 9.2 01/11/2015 1340   CALCIUM 9.0 01/08/2015 1020   PROT 6.7 01/11/2015 1340   PROT 6.6 11/17/2014 2205   ALBUMIN 3.9 01/11/2015 1340   ALBUMIN 3.6 11/17/2014 2205   AST 11 01/11/2015 1340  AST 22 11/17/2014 2205   ALT 12 01/11/2015 1340   ALT 20 11/17/2014 2205   ALKPHOS 99 01/11/2015 1340   ALKPHOS 94 11/17/2014 2205   BILITOT 0.85 01/11/2015 1340   BILITOT 0.6 11/17/2014 2205   GFRNONAA >60 01/08/2015 1020   GFRAA >60 01/08/2015 1020         RADIOGRAPHY: Dg Chest 2 View  01/13/2015   CLINICAL DATA:  Diffuse chest, side, and back pain, history metastatic RIGHT breast cancer  EXAM: CHEST  2 VIEW  COMPARISON:  11/18/2014  FINDINGS: Normal heart size, mediastinal contours, and pulmonary vascularity.  Interval removal of LEFT jugular Port-A-Cath.  Improved aeration in the lower RIGHT chest since previous exam with decreased pleural effusion and basilar atelectasis.  Emphysematous and bronchitic changes consistent with COPD.  Linear subsegmental atelectasis RIGHT base, minimally LEFT costophrenic angle.  Remaining lungs clear.  No pneumothorax or acute osseous findings.  Post RIGHT mastectomy and axillary node dissection.  IMPRESSION: Chronic bronchitic changes.  Mild residual subsegmental atelectasis RIGHT base with significantly improved RIGHT basilar aeration and resolution of RIGHT pleural effusion since previous exam.   Electronically Signed   By: Lavonia Dana M.D.   On: 01/13/2015 12:06   Ct Angio Chest Pe W/cm &/or Wo Cm  01/13/2015   CLINICAL DATA:  Bilateral back pain, beginning Monday, worsening today. History of right breast cancer and prior right mastectomy and chemotherapy. Evaluate for pulmonary embolus.  EXAM: CT ANGIOGRAPHY CHEST WITH CONTRAST  TECHNIQUE: Multidetector CT  imaging of the chest was performed using the standard protocol during bolus administration of intravenous contrast. Multiplanar CT image reconstructions and MIPs were obtained to evaluate the vascular anatomy.  CONTRAST:  161mL OMNIPAQUE IOHEXOL 350 MG/ML SOLN  COMPARISON:  PET CT 01/05/2015  FINDINGS: No filling defects in the pulmonary arteries to suggest pulmonary emboli. Heart is normal size. Aorta is normal caliber. No mediastinal, hilar, or axillary adenopathy.  Presumed recent removal of left Port-A-Cath. There is gas within the port pocket within the left upper chest wall. Prior right mastectomy.  There is a small right pleural effusion, slightly decreased since PET CT. Airspace disease anteriorly in the right upper lobe likely reflects radiation change. Mild fullness of the scratch head mild soft tissue fullness of the right hilum is stable since prior PET CT. No visible adenopathy. Minimal dependent ground-glass opacities in both lower lobes, likely atelectasis.  Imaging into the upper abdomen shows no acute findings. Previously described hepatic disease by PET CT cannot be appreciated on today's study.  Area of sclerosis noted in the superior endplate of T9, presumably bony metastatic disease. This is stable since recent PET CT.  Review of the MIP images confirms the above findings.  IMPRESSION: No evidence of pulmonary embolus.  Small right pleural effusion, decreased since prior PET CT. Stable right hilar soft tissue fullness. Stable airspace disease anteriorly in the right upper lobe, likely postradiation changes.  Presumed metastatic disease in the superior T9 vertebral body.   Electronically Signed   By: Rolm Baptise M.D.   On: 01/13/2015 14:07   Nm Pet Image Restag (ps) Skull Base To Thigh  01/05/2015   CLINICAL DATA:  Subsequent treatment strategy for breast cancer.  EXAM: NUCLEAR MEDICINE PET SKULL BASE TO THIGH  TECHNIQUE: 8.47 mCi F-18 FDG was injected intravenously. Full-ring PET imaging was  performed from the skull base to thigh after the radiotracer. CT data was obtained and used for attenuation correction and anatomic localization.  FASTING BLOOD GLUCOSE:  Value: 94 mg/dl  COMPARISON:  11/18/2014  FINDINGS: NECK  Resolution lower cervical hypermetabolic lymphadenopathy. A few tiny scattered lymph nodes are noted.  CHEST  A left-sided Port-A-Cath is in place. Status post right mastectomy and right lymph node dissection. No residual right axillary lymphadenopathy. No hypermetabolism. No left-sided breast mass or left-sided adenopathy.  Resolution of hypermetabolic mediastinal and hilar lymphadenopathy. There is some residual fullness in the right hilum but no hypermetabolism.  There is a persistent right-sided pleural effusion. Focus of hypermetabolism in the anterior aspect of the right upper lobe is most likely radiation change. No persistent hypermetabolic pulmonary metastatic disease. Persistent moderate interstitial thickening in the right lower lobe. No new lung lesions.  ABDOMEN/PELVIS  Near complete resolution of hepatic metastatic disease. There is a faintly metabolically active lesion in segment 4 a. SUV max is 4.5. The adrenal metastatic lesions have resolved. No enlarged or hypermetabolic abdominal/pelvic lymphadenopathy.  SKELETON  Diffuse osseous uptake is likely due to rebound from chemotherapy or marrow stimulating drugs.  IMPRESSION: 1. Excellent response to therapy is demonstrated with resolution of neck and chest lymphadenopathy. The pulmonary metastatic disease is markedly improved. Near complete resolution of liver metastasis and adrenal gland metastasis. 2. Hypermetabolic area in the right upper lobe may be related to radiation change. 3. One residual mildly hypermetabolic liver metastasis. 4. Diffuse osseous uptake is likely due to rebound or marrow stimulating drugs. 5. Persistent right pleural effusion.   Electronically Signed   By: Marijo Sanes M.D.   On: 01/05/2015 10:14    Ir Removal [employer] Access W/ Port W/o Fl Mod Sed  01/08/2015   CLINICAL DATA:  Infected left jugular Port-A-Cath. Examination of the left chest demonstrates erythema without evidence of pus. Examination of the port site demonstrates no evidence of fluctuance.  EXAM: REMOVAL RIGHT IJ VEIN PORT-A-CATH  PROCEDURE: The left chest was prepped and draped in a sterile fashion. Lidocaine was utilized for local anesthesia. An incision was made over the previously healed surgical incision. There is no evidence of pus or fluid within the port pocket. Hopefully, there is only cellulitis in the overlying skin. Therefore, the plan is to close the incision by primary intention. Utilizing blunt dissection, the port catheter and reservoir were removed from the underlying subcutaneous tissue in their entirety. Securing sutures were also removed. The pocket was irrigated with a copious amount of sterile normal saline. The pocket was closed with interrupted 3-0 Vicryl stitches. The subcutaneous tissue was closed with 3-0 Vicryl interrupted subcutaneous stitches. A 4-0 Vicryl running subcuticular stitch was utilized to approximate the skin. Dermabond was applied.  IMPRESSION: Successful left IJ vein Port-A-Cath explant. There is a possibility of pocket infection. The patient was instructed to contact us immediately if erythema or swelling increases at the port site. If this occurs, the site may need to be opened and packed with iodoform gauze.   Electronically Signed   By: Marybelle Killings M.D.   On: 01/08/2015 14:33      IMPRESSION/PLAN: The pt is scheduled for chemotherapy on 01/18/15 at 1PM with labs at [facility]. We anticipate 2 weeks of radiotherapy to T9 vertebral body with palliative intent. We went over the process of treatment and side effects that may arise. We will call the pt for her next available treatment planning session. The pt signed a consent form and this was placed in the pt's medical chart.   For her brain disease: I  will ask [facility] to schedule an MRI  Of her  brain in mid/late October and she will f/u with me afterwards.  I spent 25 minutes  face to face with the patient and more than 50% of that time was spent in counseling and/or coordination of care.   This document serves as a record of services personally performed by Eppie Gibson, MD. It was created on her behalf by Darcus Austin, a trained medical scribe. The creation of this record is based on the scribe's personal observations and the provider's statements to them. This document has been checked and approved by the attending provider.     __________________________________________   Eppie Gibson, MD

## 2015-01-15 NOTE — Progress Notes (Signed)
Nicole Valentine here for follow up.  She reports having back pain that started on Monday.  She had a CT scan on 8/24 which showed "presumed metastatic disease in the superior T9 vertebral body."  She started taking decadron on Wednesday, 2 tablets BID, and said the pain improved.  She now is having sharp pains in her left mid back.  She denies dizziness, headaches, balance issues, vision changes and memory issues.  She reports having nausea the first day after SRS but none since.  She reports having slight fatigue.  She had chemotherapy on 12/28/14 and will have it again on Monday.  BP 126/75 mmHg  Pulse 62  Temp(Src) 97.9 F (36.6 C) (Oral)  Resp 12  Ht 5\' 7"  (1.702 m)  Wt 166 lb 11.2 oz (75.615 kg)  BMI 26.10 kg/m2  SpO2 100%  LMP 03/29/2012   Wt Readings from Last 3 Encounters:  01/15/15 166 lb 11.2 oz (75.615 kg)  01/11/15 169 lb 6.4 oz (76.839 kg)  01/08/15 169 lb (76.658 kg)

## 2015-01-16 ENCOUNTER — Other Ambulatory Visit: Payer: Self-pay | Admitting: Oncology

## 2015-01-18 ENCOUNTER — Ambulatory Visit (HOSPITAL_BASED_OUTPATIENT_CLINIC_OR_DEPARTMENT_OTHER): Payer: BLUE CROSS/BLUE SHIELD

## 2015-01-18 ENCOUNTER — Ambulatory Visit (HOSPITAL_BASED_OUTPATIENT_CLINIC_OR_DEPARTMENT_OTHER): Payer: BLUE CROSS/BLUE SHIELD | Admitting: Nurse Practitioner

## 2015-01-18 ENCOUNTER — Other Ambulatory Visit: Payer: Self-pay | Admitting: Oncology

## 2015-01-18 ENCOUNTER — Other Ambulatory Visit (HOSPITAL_BASED_OUTPATIENT_CLINIC_OR_DEPARTMENT_OTHER): Payer: BLUE CROSS/BLUE SHIELD

## 2015-01-18 ENCOUNTER — Encounter: Payer: Self-pay | Admitting: Nurse Practitioner

## 2015-01-18 VITALS — BP 132/82 | HR 66 | Temp 98.0°F | Resp 18 | Ht 67.0 in | Wt 166.8 lb

## 2015-01-18 DIAGNOSIS — C7801 Secondary malignant neoplasm of right lung: Secondary | ICD-10-CM

## 2015-01-18 DIAGNOSIS — C50919 Malignant neoplasm of unspecified site of unspecified female breast: Secondary | ICD-10-CM

## 2015-01-18 DIAGNOSIS — C7951 Secondary malignant neoplasm of bone: Principal | ICD-10-CM

## 2015-01-18 DIAGNOSIS — Z5111 Encounter for antineoplastic chemotherapy: Secondary | ICD-10-CM | POA: Diagnosis not present

## 2015-01-18 DIAGNOSIS — C7971 Secondary malignant neoplasm of right adrenal gland: Secondary | ICD-10-CM

## 2015-01-18 DIAGNOSIS — C7931 Secondary malignant neoplasm of brain: Secondary | ICD-10-CM

## 2015-01-18 DIAGNOSIS — C7802 Secondary malignant neoplasm of left lung: Secondary | ICD-10-CM

## 2015-01-18 DIAGNOSIS — C50411 Malignant neoplasm of upper-outer quadrant of right female breast: Secondary | ICD-10-CM

## 2015-01-18 DIAGNOSIS — J90 Pleural effusion, not elsewhere classified: Secondary | ICD-10-CM

## 2015-01-18 DIAGNOSIS — C778 Secondary and unspecified malignant neoplasm of lymph nodes of multiple regions: Secondary | ICD-10-CM

## 2015-01-18 DIAGNOSIS — C787 Secondary malignant neoplasm of liver and intrahepatic bile duct: Secondary | ICD-10-CM

## 2015-01-18 DIAGNOSIS — Z171 Estrogen receptor negative status [ER-]: Secondary | ICD-10-CM

## 2015-01-18 LAB — COMPREHENSIVE METABOLIC PANEL (CC13)
ALBUMIN: 3.7 g/dL (ref 3.5–5.0)
ALK PHOS: 75 U/L (ref 40–150)
ALT: 13 U/L (ref 0–55)
AST: 12 U/L (ref 5–34)
Anion Gap: 8 mEq/L (ref 3–11)
BILIRUBIN TOTAL: 0.61 mg/dL (ref 0.20–1.20)
BUN: 20.3 mg/dL (ref 7.0–26.0)
CO2: 27 mEq/L (ref 22–29)
CREATININE: 0.7 mg/dL (ref 0.6–1.1)
Calcium: 9 mg/dL (ref 8.4–10.4)
Chloride: 105 mEq/L (ref 98–109)
EGFR: >90 mL/min/{1.73_m2} (ref >90)
GLUCOSE: 96 mg/dL (ref 70–140)
Potassium: 3.6 mEq/L (ref 3.5–5.1)
SODIUM: 139 meq/L (ref 136–145)
TOTAL PROTEIN: 6.6 g/dL (ref 6.4–8.3)

## 2015-01-18 LAB — CBC WITH DIFFERENTIAL/PLATELET
BASO%: 0.2 % (ref 0.0–2.0)
BASOS ABS: 0 10*3/uL (ref 0.0–0.1)
EOS%: 0.1 % (ref 0.0–7.0)
Eosinophils Absolute: 0 10*3/uL (ref 0.0–0.5)
HCT: 36.7 % (ref 34.8–46.6)
HGB: 12.5 g/dL (ref 11.6–15.9)
LYMPH%: 17.6 % (ref 14.0–49.7)
MCH: 35.7 pg — ABNORMAL HIGH (ref 25.1–34.0)
MCHC: 34.2 g/dL (ref 31.5–36.0)
MCV: 104.3 fL — ABNORMAL HIGH (ref 79.5–101.0)
MONO#: 0.6 10*3/uL (ref 0.1–0.9)
MONO%: 8.1 % (ref 0.0–14.0)
NEUT#: 5.1 10*3/uL (ref 1.5–6.5)
NEUT%: 74 % (ref 38.4–76.8)
PLATELETS: 215 10*3/uL (ref 145–400)
RBC: 3.52 10*6/uL — ABNORMAL LOW (ref 3.70–5.45)
RDW: 15.4 % — ABNORMAL HIGH (ref 11.2–14.5)
WBC: 6.9 10*3/uL (ref 3.9–10.3)
lymph#: 1.2 10*3/uL (ref 0.9–3.3)

## 2015-01-18 MED ORDER — SODIUM CHLORIDE 0.9 % IV SOLN
Freq: Once | INTRAVENOUS | Status: AC
  Administered 2015-01-18: 13:00:00 via INTRAVENOUS

## 2015-01-18 MED ORDER — PACLITAXEL PROTEIN-BOUND CHEMO INJECTION 100 MG
100.0000 mg/m2 | Freq: Once | INTRAVENOUS | Status: AC
  Administered 2015-01-18: 200 mg via INTRAVENOUS
  Filled 2015-01-18: qty 40

## 2015-01-18 MED ORDER — SODIUM CHLORIDE 0.9 % IV SOLN
Freq: Once | INTRAVENOUS | Status: AC
  Administered 2015-01-18: 14:00:00 via INTRAVENOUS
  Filled 2015-01-18: qty 4

## 2015-01-18 NOTE — Patient Instructions (Signed)
Cancer Center Discharge Instructions for Patients Receiving Chemotherapy  Today you received the following chemotherapy agents: Abraxane   To help prevent nausea and vomiting after your treatment, we encourage you to take your nausea medication as directed.    If you develop nausea and vomiting that is not controlled by your nausea medication, call the clinic.   BELOW ARE SYMPTOMS THAT SHOULD BE REPORTED IMMEDIATELY:  *FEVER GREATER THAN 100.5 F  *CHILLS WITH OR WITHOUT FEVER  NAUSEA AND VOMITING THAT IS NOT CONTROLLED WITH YOUR NAUSEA MEDICATION  *UNUSUAL SHORTNESS OF BREATH  *UNUSUAL BRUISING OR BLEEDING  TENDERNESS IN MOUTH AND THROAT WITH OR WITHOUT PRESENCE OF ULCERS  *URINARY PROBLEMS  *BOWEL PROBLEMS  UNUSUAL RASH Items with * indicate a potential emergency and should be followed up as soon as possible.  Feel free to call the clinic you have any questions or concerns. The clinic phone number is [phone].  Please show the CHEMO ALERT CARD at check-in to the Emergency Department and triage nurse.   

## 2015-01-18 NOTE — Progress Notes (Signed)
And a Patient ID: Nicole Valentine, female   DOB: [birth year], 49 y.o.   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 She had a mammogram performed that showed dense breasts bilaterally. Ultrasound of the right breast showed 2.3 x 1.9 cm area of abnormality with multiple abnormal lymph nodes. MRI of the bilateral breasts showed asymmetrical enhancement throughout the right breast consistent with multicentric disease. An ultrasound-guided biopsy performed. The known area of disease measured 1.9 x 1.9 x 2.3 cm. Multiple abnormal positive right axillary lymph nodes were noted. 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 Biopsy of the right axillary lymph node was positive for malignancy with extracapsular extension. Patient was originally seen by Dr. Margot Valentine Dr. Truddie Valentine and Dr. Pablo Valentine. She has elected to have a right mastectomy eventually and declined biopsies of any other areas within the breast."  She went on to receive neoadjuvant chemotherapy and attained a complete pathologic response, as documented below  Nicole Valentine noted a very small right supraclavicular mass 09/25/2014, which she brought to our attention. She was having some allergy symptoms at the time so we decided to reevaluate this after a few weeks and on 10/22/2014 as the mass persisted we obtained a restaging neck CT scan, 10/28/2014. There was bilateral supraclavicular adenopathy, but also a large right pleural effusion was incidentally noted. We proceeded to right  thoracentesis 10/29/2014, and a liter of hazy yellow fluid was removed. Cytology from this procedure (Nicole [facility]) showed malignant cells consistent with adenocarcinoma, estrogen and progesterone receptor negative, HER-2 not amplified, with a signals ratio 1.12 and the number per cell being 2.75, and with an MIB-1 of 80%. I called Nicole Valentine with these results and set her up for a PET scan performed 11/06/2014, which shows widespread metastatic disease involving particularly the right lung, liver, and bones, but also the left lung, right adrenal, and multiple lymph node areas.  Her subsequent history is as detailed below.  INTERVAL HISTORY: Nicole Valentine returns today for follow-up of her metastatic breast cancer. Today she is due for day 1, cycle 4 of abraxane. Last week's dose was held as she had recently had her port removed and was being treated with zyvox for a potential port infection. She has just 1 dose of this drug left to take. She had a left peripheral IV placed today for chemo later.  REVIEW OF S/YSTEMS: Nicole Valentine denies fevers, chills, nausea, vomiting, or changes in bowel or bladder habits. She denies mouth sores or neuropathy symptoms. The yeast she experienced last week has been resolved with diflucan. Late last week she experienced left flank pain and tenderness. This has since quieted. She was evaluated with a CT angio of the chest after concerns that she may have a pleural embolism. She has no shortness of breath, chest pain, cough, or palpitations. A detailed review of systems is otherwise stable.  PAST MEDICAL HISTORY: Past Medical History  Diagnosis Date  . Complication of anesthesia     her father and uncle both had very difficult time waking up-was  something they told them may be hereditory. she will  find out.  [name] Hypothyroidism   . 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 Radiation 01/21/13-03/06/13    Right Breast  . Breast cancer dx'd 04-10-12-rt    PAST SURGICAL HISTORY: Past  Surgical History  Procedure Laterality Date  . Wisdom tooth extraction    . Portacath placement  04/22/2012    Procedure: INSERTION PORT-A-CATH;  Surgeon: Nicole Lasso, MD;  Location: [facility];  Service: General;  Laterality: Left;  [name] Modified mastectomy Right 11/18/2012    Procedure:  RIGHT MODIFIED MASTECTOMY;  Surgeon: Nicole Lasso, MD;  Location: [facility];  Service: General;  Laterality: Right;  . Knee arthroscopy Right 11/26/2013  . Port-a-cath removal Left 12/10/2013    Procedure: MINOR REMOVAL PORT-A-CATH;  Surgeon: Nicole Hector, MD;  Location: [facility];  Service: General;  Laterality: Left;    FAMILY HISTORY Family History  Problem Relation Age of Onset  . Lung cancer Maternal Grandfather 58    smoker  . Prostate cancer Maternal Grandfather 90  . Throat cancer Other     Great Aunt x 2  . Liver cancer Other     Maternal Great Grandmother  . Melanoma Maternal Uncle 81  . Brain cancer Cousin 11    non-malignant  the patient's parents are living, in their early 75s. The patient has 2 brothers, no sisters. The patient's mother was diagnosed with breast cancer, HER-2 positive, in 2014 in [place].  GYNECOLOGIC HISTORY:   (Updated 10/03/2013) Menarche age 16, first live birth age 34. She is GX P4. LMP January 2014. 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  The children are currently ages 30, 106, 103, and 69. Her husband, Nicole Valentine, is a Customer service manager.He just started a job for [employer].  They attend the [facility] DIRECTIVES: The patient's husband is her HCPOA   HEALTH MAINTENANCE:  (Updated 10/03/2013) Social History  Substance Use Topics  . Smoking status: Never Smoker   . Smokeless tobacco: Never Used  . Alcohol Use: No     Colonoscopy: Never  PAP: November 2013/[facility]  Bone  density: January 2015, [facility], normal  Lipid panel:  Not on file  Allergies  Allergen Reactions  . Almond Meal Rash  . Almond Oil Rash  . Other Rash    LOTIONS  . Tegaderm Ag Mesh [Silver] Rash  . Vancomycin Rash    Red Man Syndrome    Current Outpatient Prescriptions  Medication Sig Dispense Refill  . cholecalciferol (VITAMIN D) 1000 UNITS tablet Take 2,000 Units by mouth 2 (two) times daily.     [name] dexamethasone (DECADRON) 4 MG tablet Take 4 mg by mouth 2 (two) times daily with a meal. Takes 2 tablets bid    . levothyroxine (SYNTHROID, LEVOTHROID) 175 MCG tablet Take 175 mcg by mouth daily before breakfast.    . linezolid (ZYVOX) 600 MG tablet Take 1 tablet (600 mg total) by mouth 2 (two) times daily. 20 tablet 0  . LORazepam (ATIVAN) 0.5 MG tablet Take 0.5 mg by mouth at bedtime as needed for sleep.   0  . saccharomyces boulardii (FLORASTOR) 250 MG capsule Take 1 capsule (250 mg total) by mouth 2 (two) times daily. (Patient taking differently: Take 750-1,000 mg by mouth as directed. Patient alternates 3 caps one day the next she takes 4) 60 capsule  2  . fluconazole (DIFLUCAN) 150 MG tablet Take 1 tablet (150 mg total) by mouth daily. Take 1 tablet today. May repeat dose once in 3 days. (Patient not taking: Reported on 01/18/2015) 2 tablet 0  . ibuprofen (ADVIL,MOTRIN) 200 MG tablet Take 400 mg by mouth every 6 (six) hours as needed for headache or moderate pain.    . Ibuprofen-Diphenhydramine HCl 200-25 MG CAPS Take 1 capsule by mouth at bedtime as needed (sleep).    . loratadine (CLARITIN) 10 MG tablet Take 10 mg by mouth daily.    . Loratadine-Pseudoephedrine (CLARITIN-D 12 HOUR PO) Take 1 tablet by mouth 2 (two) times daily as needed (allergies).    . nystatin cream (MYCOSTATIN) Apply 1 application topically 2 (two) times daily. (Patient not taking: Reported on 01/18/2015) 30 g 0  . ondansetron (ZOFRAN) 8 MG tablet Take 1 tablet (8 mg total) by mouth 2 (two) times daily. Start the day  after chemo for 2 days. Then take as needed for nausea or vomiting. (Patient not taking: Reported on 01/11/2015) 30 tablet 1  . prochlorperazine (COMPAZINE) 10 MG tablet Take 1 tablet (10 mg total) by mouth every 6 (six) hours as needed (Nausea or vomiting). (Patient not taking: Reported on 01/11/2015) 30 tablet 1   No current facility-administered medications for this visit.   Facility-Administered Medications Ordered in Other Visits  Medication Dose Route Frequency Provider Last Rate Last Dose  . sodium chloride 0.9 % injection 10 mL  10 mL Intracatheter PRN Chauncey Cruel, MD   10 mL at 12/28/14 1141  . sodium chloride 0.9 % injection 3 mL  3 mL Intravenous PRN Chauncey Cruel, MD      . vancomycin (VANCOCIN) 1,250 mg in sodium chloride 0.9 % 500 mL IVPB  1,250 mg Intravenous Once Chauncey Cruel, MD          OBJECTIVE: Middle-aged white woman in no acute distress Filed Vitals:   01/18/15 1200  BP: 132/82  Pulse: 66  Temp: 98 F (36.7 C)  Resp: 18     Body mass index is 26.12 kg/(m^2).    ECOG FS:1 - Symptomatic but completely ambulatory Filed Weights   01/18/15 1200  Weight: 166 lb 12.8 oz (75.66 kg)   Skin: resolution to red inflamed areas on left chest. Normal skin with no rash. Sclerae unicteric, pupils round and equal Oropharynx clear and moist-- no thrush or other lesions No cervical or supraclavicular adenopathy Lungs no rales or rhonchi Heart regular rate and rhythm Abd soft, nontender, positive bowel sounds MSK no focal spinal tenderness, no upper extremity lymphedema Neuro: nonfocal, well oriented, appropriate affect Breasts: deferred  LAB RESULTS:   Lab Results  Component Value Date   WBC 6.9 01/18/2015   NEUTROABS 5.1 01/18/2015   HGB 12.5 01/18/2015   HCT 36.7 01/18/2015   MCV 104.3* 01/18/2015   PLT 215 01/18/2015      Chemistry      Component Value Date/Time   NA 141 01/11/2015 1340   NA 137 01/08/2015 1020   K 3.5 01/11/2015 1340   K 3.9  01/08/2015 1020   CL 107 01/08/2015 1020   CL 101 10/18/2012 0859   CO2 26 01/11/2015 1340   CO2 23 01/08/2015 1020   BUN 10.3 01/11/2015 1340   BUN 13 01/08/2015 1020   CREATININE 0.7 01/11/2015 1340   CREATININE 0.69 01/08/2015 1020      Component Value Date/Time   CALCIUM 9.2 01/11/2015 1340   CALCIUM  9.0 01/08/2015 1020   ALKPHOS 99 01/11/2015 1340   ALKPHOS 94 11/17/2014 2205   AST 11 01/11/2015 1340   AST 22 11/17/2014 2205   ALT 12 01/11/2015 1340   ALT 20 11/17/2014 2205   BILITOT 0.85 01/11/2015 1340   BILITOT 0.6 11/17/2014 2205       STUDIES: Dg Chest 2 View  01/13/2015   CLINICAL DATA:  Diffuse chest, side, and back pain, history metastatic RIGHT breast cancer  EXAM: CHEST  2 VIEW  COMPARISON:  11/18/2014  FINDINGS: Normal heart size, mediastinal contours, and pulmonary vascularity.  Interval removal of LEFT jugular Port-A-Cath.  Improved aeration in the lower RIGHT chest since previous exam with decreased pleural effusion and basilar atelectasis.  Emphysematous and bronchitic changes consistent with COPD.  Linear subsegmental atelectasis RIGHT base, minimally LEFT costophrenic angle.  Remaining lungs clear.  No pneumothorax or acute osseous findings.  Post RIGHT mastectomy and axillary node dissection.  IMPRESSION: Chronic bronchitic changes.  Mild residual subsegmental atelectasis RIGHT base with significantly improved RIGHT basilar aeration and resolution of RIGHT pleural effusion since previous exam.   Electronically Signed   By: Lavonia Dana M.D.   On: 01/13/2015 12:06   Ct Angio Chest Pe W/cm &/or Wo Cm  01/13/2015   CLINICAL DATA:  Bilateral back pain, beginning Monday, worsening today. History of right breast cancer and prior right mastectomy and chemotherapy. Evaluate for pulmonary embolus.  EXAM: CT ANGIOGRAPHY CHEST WITH CONTRAST  TECHNIQUE: Multidetector CT imaging of the chest was performed using the standard protocol during bolus administration of intravenous  contrast. Multiplanar CT image reconstructions and MIPs were obtained to evaluate the vascular anatomy.  CONTRAST:  162m OMNIPAQUE IOHEXOL 350 MG/ML SOLN  COMPARISON:  PET CT 01/05/2015  FINDINGS: No filling defects in the pulmonary arteries to suggest pulmonary emboli. Heart is normal size. Aorta is normal caliber. No mediastinal, hilar, or axillary adenopathy.  Presumed recent removal of left Port-A-Cath. There is gas within the port pocket within the left upper chest wall. Prior right mastectomy.  There is a small right pleural effusion, slightly decreased since PET CT. Airspace disease anteriorly in the right upper lobe likely reflects radiation change. Mild fullness of the scratch head mild soft tissue fullness of the right hilum is stable since prior PET CT. No visible adenopathy. Minimal dependent ground-glass opacities in both lower lobes, likely atelectasis.  Imaging into the upper abdomen shows no acute findings. Previously described hepatic disease by PET CT cannot be appreciated on today's study.  Area of sclerosis noted in the superior endplate of T9, presumably bony metastatic disease. This is stable since recent PET CT.  Review of the MIP images confirms the above findings.  IMPRESSION: No evidence of pulmonary embolus.  Small right pleural effusion, decreased since prior PET CT. Stable right hilar soft tissue fullness. Stable airspace disease anteriorly in the right upper lobe, likely postradiation changes.  Presumed metastatic disease in the superior T9 vertebral body.   Electronically Signed   By: KRolm BaptiseM.D.   On: 01/13/2015 14:07   Nm Pet Image Restag (ps) Skull Base To Thigh  01/05/2015   CLINICAL DATA:  Subsequent treatment strategy for breast cancer.  EXAM: NUCLEAR MEDICINE PET SKULL BASE TO THIGH  TECHNIQUE: 8.47 mCi F-18 FDG was injected intravenously. Full-ring PET imaging was performed from the skull base to thigh after the radiotracer. CT data was obtained and used for  attenuation correction and anatomic localization.  FASTING BLOOD GLUCOSE:  Value: 94  mg/dl  COMPARISON:  11/18/2014  FINDINGS: NECK  Resolution lower cervical hypermetabolic lymphadenopathy. A few tiny scattered lymph nodes are noted.  CHEST  A left-sided Port-A-Cath is in place. Status post right mastectomy and right lymph node dissection. No residual right axillary lymphadenopathy. No hypermetabolism. No left-sided breast mass or left-sided adenopathy.  Resolution of hypermetabolic mediastinal and hilar lymphadenopathy. There is some residual fullness in the right hilum but no hypermetabolism.  There is a persistent right-sided pleural effusion. Focus of hypermetabolism in the anterior aspect of the right upper lobe is most likely radiation change. No persistent hypermetabolic pulmonary metastatic disease. Persistent moderate interstitial thickening in the right lower lobe. No new lung lesions.  ABDOMEN/PELVIS  Near complete resolution of hepatic metastatic disease. There is a faintly metabolically active lesion in segment 4 a. SUV max is 4.5. The adrenal metastatic lesions have resolved. No enlarged or hypermetabolic abdominal/pelvic lymphadenopathy.  SKELETON  Diffuse osseous uptake is likely due to rebound from chemotherapy or marrow stimulating drugs.  IMPRESSION: 1. Excellent response to therapy is demonstrated with resolution of neck and chest lymphadenopathy. The pulmonary metastatic disease is markedly improved. Near complete resolution of liver metastasis and adrenal gland metastasis. 2. Hypermetabolic area in the right upper lobe may be related to radiation change. 3. One residual mildly hypermetabolic liver metastasis. 4. Diffuse osseous uptake is likely due to rebound or marrow stimulating drugs. 5. Persistent right pleural effusion.   Electronically Signed   By: Marijo Sanes M.D.   On: 01/05/2015 10:14   Ir Removal [employer] Access W/ Port W/o Fl Mod Sed  01/08/2015   CLINICAL DATA:  Infected left  jugular Port-A-Cath. Examination of the left chest demonstrates erythema without evidence of pus. Examination of the port site demonstrates no evidence of fluctuance.  EXAM: REMOVAL RIGHT IJ VEIN PORT-A-CATH  PROCEDURE: The left chest was prepped and draped in a sterile fashion. Lidocaine was utilized for local anesthesia. An incision was made over the previously healed surgical incision. There is no evidence of pus or fluid within the port pocket. Hopefully, there is only cellulitis in the overlying skin. Therefore, the plan is to close the incision by primary intention. Utilizing blunt dissection, the port catheter and reservoir were removed from the underlying subcutaneous tissue in their entirety. Securing sutures were also removed. The pocket was irrigated with a copious amount of sterile normal saline. The pocket was closed with interrupted 3-0 Vicryl stitches. The subcutaneous tissue was closed with 3-0 Vicryl interrupted subcutaneous stitches. A 4-0 Vicryl running subcuticular stitch was utilized to approximate the skin. Dermabond was applied.  IMPRESSION: Successful left IJ vein Port-A-Cath explant. There is a possibility of pocket infection. The patient was instructed to contact us immediately if erythema or swelling increases at the port site. If this occurs, the site may need to be opened and packed with iodoform gauze.   Electronically Signed   By: Marybelle Killings M.D.   On: 01/08/2015 14:33    ASSESSMENT: 49 y.o. BRCA negative [place] woman with triple-negative stage IV breast cancer  (1) an status post right breast upper outer quadrant and right axillary lymph node biopsy 04/04/2012, both positive for an invasive ductal carcinoma, grade 3, triple negative, with an MIB-1 of 25%  (2) Treated neoadjuvantly with  (a) fluorouracil, cyclophosphamide, and epirubicin (at 100 mg/M2) x4 completed 06/07/2012  (b) docetaxel (75 mg/M2) for one dose, 06/21/2012, poorly tolerated  (c) carboplatin and  gemcitabine given every 21 days for 6 cycles completed 10/11/2012  (3)  status post right modified radical mastectomy 11/18/2012 showing a complete pathologic response--all 16 lymph nodes were benign  (a) no plans for reconstructon  (4) adjuvant radiation therapy completed 03/06/2013  METASTATIC DISEASE June 2016 (5) 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, 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  (6) staging PET scan 11/06/2014 showed significant disease in the middle and lower lobes of the Right lung, Right effusion, multiple liver and bone lesions, as well as left lung nodules, a Right adrenal nodule, and widespread hypermetabolic adenopathy  (a) brain MRI 11/16/2014 showed multiple cerebellar lesions  (7) abraxane day 1 and day 8 of each 21 day cycle started 11/09/2014  (a) PET scan 01/05/2015 shows an excellent initial response after 3 cycles  (8) zolendronate started 11/16/2014, to be repeated every 12 weeks  (9) Foundation 1 study sent  11/06/2014: patient's sister in law Garvin Fila following [phone])  (a) 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[place], NTRK1, MYC, ARID1A and LYN, none w approved therapies  (b) pazopanib, ponatinib or crizotinib suggested as possible off-protocol options  (10) Brain metastases:  (a) s/p SRS therapy 12/11/2014 to 4 right cerebellar lesions  PLAN: Nicole [facility] and I reviewed the results of her most recent CT angio of the chest and chest xray. The exams showed stable disease, no evidence of pleural embolus, and significant improvement in her right pleural effusion. She is breathing well and has minimal pain to her left side at this point. She will proceed with day 1, cycle 4 of abraxane as planned today.  She has recently met with Dr. Isidore Moos who plans to administer a total of 10 radiation treatments to the disease present at the T9 level. This should not interfere with chemotherapy.  Makylie will return in 1 week for day 8 of treatment. She  understands and agrees with this plan. She knows the goal of treatment in her case is control. She has been encouraged to call with any issues that might arise before her next visit here.   Laurie Panda, NP   01/18/2015 12:27 PM

## 2015-01-19 ENCOUNTER — Ambulatory Visit: Payer: BLUE CROSS/BLUE SHIELD | Admitting: Oncology

## 2015-01-20 ENCOUNTER — Encounter: Payer: Self-pay | Admitting: Oncology

## 2015-01-20 ENCOUNTER — Ambulatory Visit
Admission: RE | Admit: 2015-01-20 | Discharge: 2015-01-20 | Disposition: A | Discharge status: Home / Self Care | Payer: BLUE CROSS/BLUE SHIELD | Source: Ambulatory Visit | Attending: Radiation Oncology | Admitting: Radiation Oncology

## 2015-01-20 DIAGNOSIS — C7951 Secondary malignant neoplasm of bone: Secondary | ICD-10-CM

## 2015-01-20 DIAGNOSIS — C50911 Malignant neoplasm of unspecified site of right female breast: Secondary | ICD-10-CM | POA: Diagnosis not present

## 2015-01-20 NOTE — Progress Notes (Signed)
Per [facility] zyvox tablet has been approved 01/08/15-02/07/15. I will send to medical records.

## 2015-01-20 NOTE — Progress Notes (Signed)
   Radiation Oncology         [phone] ________________________________  Name: Nicole Valentine MRN: [id number]  Date: 01/20/2015  DOB: [birth year]  SIMULATION AND TREATMENT PLANNING NOTE  Outpatient  DIAGNOSIS:     ICD-9-CM ICD-10-CM   1. Bone metastases 198.5 C79.51     NARRATIVE:  The patient was brought to the [facility].  Identity was confirmed.  All relevant records and images related to the planned course of therapy were reviewed.  The patient freely provided informed written consent to proceed with treatment after reviewing the details related to the planned course of therapy. The consent form was witnessed and verified by the simulation staff.    Then, the patient was set-up in a stable reproducible  supine position for radiation therapy.  CT images were obtained.  Surface markings were placed.  The CT images were loaded into the planning software.    TREATMENT PLANNING NOTE: Treatment planning then occurred.  The radiation prescription was entered and confirmed.    A total of 2 medically necessary complex treatment devices were fabricated and supervised by me - 2 fields with MLCS to block esophagus, cord, lungs, heart. I have requested : 3D Simulation  I have requested a DVH of the following structures: esophagus, cord, lungs heart, target volumes.    The patient will receive 30 Gy in 10 fractions from T8 to T10  Special Treatment Procedure Note: The patient will be receiving chemotherapy concurrently. Chemotherapy heightens the risk of side effects. I have considered this during the patient's treatment planning process and will monitor the patient accordingly for side effects on a weekly basis. Concurrent chemotherapy increases the complexity of this patient's treatment and therefore this constitutes a special treatment procedure.  Special Treatment Procedure Note: The patient received prior radiotherapy close to her current fields. There could be some  overlap of radiation dose.  Prior regional radiotherapy increases the risk of side effects from treatment. I have considered this in the treatment planning process and have aimed to minimize tissue overlap.  This increases the complexity of this patient's treatment and therefore this constitutes a special treatment procedure.   -----------------------------------  Eppie Gibson, MD

## 2015-01-26 ENCOUNTER — Ambulatory Visit: Payer: BLUE CROSS/BLUE SHIELD | Admitting: Nurse Practitioner

## 2015-01-26 ENCOUNTER — Other Ambulatory Visit (HOSPITAL_BASED_OUTPATIENT_CLINIC_OR_DEPARTMENT_OTHER): Payer: BLUE CROSS/BLUE SHIELD

## 2015-01-26 ENCOUNTER — Ambulatory Visit (HOSPITAL_BASED_OUTPATIENT_CLINIC_OR_DEPARTMENT_OTHER): Payer: BLUE CROSS/BLUE SHIELD

## 2015-01-26 ENCOUNTER — Ambulatory Visit (HOSPITAL_BASED_OUTPATIENT_CLINIC_OR_DEPARTMENT_OTHER): Payer: BLUE CROSS/BLUE SHIELD | Admitting: Nurse Practitioner

## 2015-01-26 ENCOUNTER — Encounter: Payer: Self-pay | Admitting: Nurse Practitioner

## 2015-01-26 ENCOUNTER — Other Ambulatory Visit: Payer: BLUE CROSS/BLUE SHIELD

## 2015-01-26 VITALS — BP 121/68 | HR 66 | Temp 98.4°F | Resp 18 | Ht 67.0 in | Wt 168.1 lb

## 2015-01-26 DIAGNOSIS — C50411 Malignant neoplasm of upper-outer quadrant of right female breast: Secondary | ICD-10-CM

## 2015-01-26 DIAGNOSIS — C7931 Secondary malignant neoplasm of brain: Secondary | ICD-10-CM

## 2015-01-26 DIAGNOSIS — Z5111 Encounter for antineoplastic chemotherapy: Secondary | ICD-10-CM

## 2015-01-26 DIAGNOSIS — C7971 Secondary malignant neoplasm of right adrenal gland: Secondary | ICD-10-CM

## 2015-01-26 DIAGNOSIS — C778 Secondary and unspecified malignant neoplasm of lymph nodes of multiple regions: Secondary | ICD-10-CM

## 2015-01-26 DIAGNOSIS — C7801 Secondary malignant neoplasm of right lung: Secondary | ICD-10-CM | POA: Diagnosis not present

## 2015-01-26 DIAGNOSIS — C787 Secondary malignant neoplasm of liver and intrahepatic bile duct: Secondary | ICD-10-CM

## 2015-01-26 DIAGNOSIS — Z171 Estrogen receptor negative status [ER-]: Secondary | ICD-10-CM

## 2015-01-26 DIAGNOSIS — C7802 Secondary malignant neoplasm of left lung: Secondary | ICD-10-CM | POA: Diagnosis not present

## 2015-01-26 DIAGNOSIS — C50919 Malignant neoplasm of unspecified site of unspecified female breast: Secondary | ICD-10-CM

## 2015-01-26 LAB — COMPREHENSIVE METABOLIC PANEL (CC13)
ALT: 13 U/L (ref 0–55)
AST: 13 U/L (ref 5–34)
Albumin: 3.6 g/dL (ref 3.5–5.0)
Alkaline Phosphatase: 60 U/L (ref 40–150)
Anion Gap: 10 mEq/L (ref 3–11)
BILIRUBIN TOTAL: 0.54 mg/dL (ref 0.20–1.20)
BUN: 13.3 mg/dL (ref 7.0–26.0)
CO2: 27 meq/L (ref 22–29)
Calcium: 9.1 mg/dL (ref 8.4–10.4)
Chloride: 104 mEq/L (ref 98–109)
Creatinine: 0.7 mg/dL (ref 0.6–1.1)
GLUCOSE: 94 mg/dL (ref 70–140)
POTASSIUM: 3.4 meq/L — AB (ref 3.5–5.1)
SODIUM: 142 meq/L (ref 136–145)
TOTAL PROTEIN: 6.3 g/dL — AB (ref 6.4–8.3)

## 2015-01-26 LAB — CBC WITH DIFFERENTIAL/PLATELET
BASO%: 0.7 % (ref 0.0–2.0)
Basophils Absolute: 0 10*3/uL (ref 0.0–0.1)
EOS ABS: 0.2 10*3/uL (ref 0.0–0.5)
EOS%: 2.2 % (ref 0.0–7.0)
HCT: 37.1 % (ref 34.8–46.6)
HGB: 12.5 g/dL (ref 11.6–15.9)
LYMPH%: 20.9 % (ref 14.0–49.7)
MCH: 35.5 pg — ABNORMAL HIGH (ref 25.1–34.0)
MCHC: 33.7 g/dL (ref 31.5–36.0)
MCV: 105.2 fL — AB (ref 79.5–101.0)
MONO#: 0.4 10*3/uL (ref 0.1–0.9)
MONO%: 5.2 % (ref 0.0–14.0)
NEUT%: 71 % (ref 38.4–76.8)
NEUTROS ABS: 4.9 10*3/uL (ref 1.5–6.5)
Platelets: 202 10*3/uL (ref 145–400)
RBC: 3.52 10*6/uL — AB (ref 3.70–5.45)
RDW: 16.2 % — AB (ref 11.2–14.5)
WBC: 6.8 10*3/uL (ref 3.9–10.3)
lymph#: 1.4 10*3/uL (ref 0.9–3.3)

## 2015-01-26 MED ORDER — PEGFILGRASTIM 6 MG/0.6ML ~~LOC~~ PSKT
6.0000 mg | PREFILLED_SYRINGE | Freq: Once | SUBCUTANEOUS | Status: AC
  Administered 2015-01-26: 6 mg via SUBCUTANEOUS
  Filled 2015-01-26: qty 0.6

## 2015-01-26 MED ORDER — PACLITAXEL PROTEIN-BOUND CHEMO INJECTION 100 MG
100.0000 mg/m2 | Freq: Once | INTRAVENOUS | Status: AC
  Administered 2015-01-26: 200 mg via INTRAVENOUS
  Filled 2015-01-26: qty 40

## 2015-01-26 MED ORDER — DEXAMETHASONE SODIUM PHOSPHATE 100 MG/10ML IJ SOLN
Freq: Once | INTRAMUSCULAR | Status: AC
  Administered 2015-01-26: 15:00:00 via INTRAVENOUS
  Filled 2015-01-26: qty 4

## 2015-01-26 MED ORDER — SODIUM CHLORIDE 0.9 % IV SOLN
Freq: Once | INTRAVENOUS | Status: AC
  Administered 2015-01-26: 15:00:00 via INTRAVENOUS

## 2015-01-26 NOTE — Patient Instructions (Signed)
Cancer Center Discharge Instructions for Patients Receiving Chemotherapy  Today you received the following chemotherapy agents: Abraxane   To help prevent nausea and vomiting after your treatment, we encourage you to take your nausea medication as directed.    If you develop nausea and vomiting that is not controlled by your nausea medication, call the clinic.   BELOW ARE SYMPTOMS THAT SHOULD BE REPORTED IMMEDIATELY:  *FEVER GREATER THAN 100.5 F  *CHILLS WITH OR WITHOUT FEVER  NAUSEA AND VOMITING THAT IS NOT CONTROLLED WITH YOUR NAUSEA MEDICATION  *UNUSUAL SHORTNESS OF BREATH  *UNUSUAL BRUISING OR BLEEDING  TENDERNESS IN MOUTH AND THROAT WITH OR WITHOUT PRESENCE OF ULCERS  *URINARY PROBLEMS  *BOWEL PROBLEMS  UNUSUAL RASH Items with * indicate a potential emergency and should be followed up as soon as possible.  Feel free to call the clinic you have any questions or concerns. The clinic phone number is [phone].  Please show the CHEMO ALERT CARD at check-in to the Emergency Department and triage nurse.   

## 2015-01-26 NOTE — Progress Notes (Signed)
Per Nira Conn NP okay to treat with CBC results and no need to wait on CMET to begin treatment

## 2015-01-26 NOTE — Progress Notes (Signed)
And a Patient ID: Nicole Valentine, female   DOB: October 30, 1965, 49 y.o.   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 She had a mammogram performed that showed dense breasts bilaterally. Ultrasound of the right breast showed 2.3 x 1.9 cm area of abnormality with multiple abnormal lymph nodes. MRI of the bilateral breasts showed asymmetrical enhancement throughout the right breast consistent with multicentric disease. An ultrasound-guided biopsy performed. The known area of disease measured 1.9 x 1.9 x 2.3 cm. Multiple abnormal positive right axillary lymph nodes were noted. 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 Biopsy of the right axillary lymph node was positive for malignancy with extracapsular extension. Patient was originally seen by Dr. [name] Dr. [name] and Dr. [name]. She has elected to have a right mastectomy eventually and declined biopsies of any other areas within the breast."  She went on to receive neoadjuvant chemotherapy and attained a complete pathologic response, as documented below  [place] noted a very small right supraclavicular mass 09/25/2014, which she brought to our attention. She was having some allergy symptoms at the time so we decided to reevaluate this after a few weeks and on 10/22/2014 as the mass persisted we obtained a restaging neck CT scan, 10/28/2014. There was bilateral supraclavicular adenopathy, but also a large right pleural effusion was incidentally noted. We proceeded to right  thoracentesis 10/29/2014, and a liter of hazy yellow fluid was removed. Cytology from this procedure ([id number]) showed malignant cells consistent with adenocarcinoma, estrogen and progesterone receptor negative, HER-2 not amplified, with a signals ratio 1.12 and the number per cell being 2.75, and with an MIB-1 of 80%. I called [facility] with these results and set her up for a PET scan performed 11/06/2014, which shows widespread metastatic disease involving particularly the right lung, liver, and bones, but also the left lung, right adrenal, and multiple lymph node areas.  Her subsequent history is as detailed below.  INTERVAL HISTORY: Nicole Valentine returns today for follow-up of her metastatic breast cancer. Today she is due for day 8, cycle 4 of abraxane with neulasta given on day 9 for granulocyte support .   REVIEW OF S/YSTEMS: Nicole Valentine denies fevers, chills, nausea or vomiting. She had some constipation this past week that is slowly resolving. She denies mouth sores, rash, or neuropathy symptoms. She is eating well and has gained some weight. She has no shortness of breath, chest pain, cough, or palpitations. A detailed review of systems is otherwise stable.  PAST MEDICAL HISTORY: Past Medical History  Diagnosis Date  . Complication of anesthesia     her father and uncle both had very difficult time waking up-was  something they told them may be hereditory. she will find out.  [name] Hypothyroidism   . 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 Radiation 01/21/13-03/06/13    Right Breast  . Breast cancer dx'd 04-10-12-rt    PAST SURGICAL HISTORY: Past Surgical History  Procedure Laterality Date  . Wisdom tooth extraction    . Portacath placement  04/22/2012    Procedure: INSERTION PORT-A-CATH;  Surgeon: [name], MD;  Location: [facility];  Service: General;  Laterality: Left;  [name] Modified mastectomy Right 11/18/2012    Procedure:  RIGHT MODIFIED MASTECTOMY;  Surgeon: [name], MD;  Location: [facility];  Service: General;  Laterality: Right;  . Knee arthroscopy Right 11/26/2013  . Port-a-cath removal Left 12/10/2013    Procedure: MINOR REMOVAL PORT-A-CATH;  Surgeon: [name], MD;  Location: [facility];  Service: General;  Laterality: Left;    FAMILY HISTORY Family History  Problem Relation Age of Onset  . Lung cancer Maternal Grandfather 20    smoker  . Prostate cancer Maternal Grandfather 90  . Throat cancer Other     Great Aunt x 2  . Liver cancer Other     Maternal Great Grandmother  . Melanoma Maternal Uncle 81  . Brain cancer Cousin 11    non-malignant  the patient's parents are living, in their early 16s. The patient has 2 brothers, no sisters. The patient's mother was diagnosed with breast cancer, HER-2 positive, in 2014 in [place].  GYNECOLOGIC HISTORY:   (Updated 10/03/2013) Menarche age 80, first live birth age 49. She is GX P4. LMP January 2014. 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  The children are currently ages 85, 82, 22, and 51. Her husband, Nicole Valentine, is a Customer service manager.He just started a job for [employer].  They attend the [employer] DIRECTIVES: The patient's husband is her HCPOA   HEALTH MAINTENANCE:  (Updated 10/03/2013) Social History  Substance Use Topics  . Smoking status: Never Smoker   . Smokeless tobacco: Never Used  . Alcohol Use: No     Colonoscopy: Never  PAP: November 2013/Dr. Smith  Bone density: January 2015, [facility], normal  Lipid panel:  Not on file  Allergies  Allergen Reactions  . Almond Meal Rash  . Almond Oil Rash  . Other Rash    LOTIONS  . Tegaderm Ag Mesh [Silver] Rash  . Vancomycin Rash    Red Man Syndrome    Current Outpatient Prescriptions  Medication Sig Dispense Refill  . cholecalciferol (VITAMIN D) 1000 UNITS tablet  Take 2,000 Units by mouth 2 (two) times daily.     [name] dexamethasone (DECADRON) 4 MG tablet Take 4 mg by mouth 2 (two) times daily with a meal. Takes 1 tablet daily    . levothyroxine (SYNTHROID, LEVOTHROID) 175 MCG tablet Take 175 mcg by mouth daily before breakfast.    . loratadine (CLARITIN) 10 MG tablet Take 10 mg by mouth daily.    [name] LORazepam (ATIVAN) 0.5 MG tablet Take 0.5 mg by mouth at bedtime as needed for sleep.   0  . saccharomyces boulardii (FLORASTOR) 250 MG capsule Take 1 capsule (250 mg total) by mouth 2 (two) times daily. (Patient taking differently: Take 750-1,000 mg by mouth as directed. Patient alternates 3 caps one day the next she takes 4) 60 capsule 2  . fluconazole (DIFLUCAN) 150 MG tablet Take 1 tablet (150 mg total) by mouth daily. Take 1 tablet today. May repeat dose once in 3 days. (Patient not taking: Reported on 01/18/2015) 2 tablet 0  . ibuprofen (ADVIL,MOTRIN) 200 MG tablet Take 400 mg by mouth every 6 (six) hours as needed for headache or moderate pain.    . Ibuprofen-Diphenhydramine HCl 200-25 MG CAPS Take 1  capsule by mouth at bedtime as needed (sleep).    . Loratadine-Pseudoephedrine (CLARITIN-D 12 HOUR PO) Take 1 tablet by mouth 2 (two) times daily as needed (allergies).    . nystatin cream (MYCOSTATIN) Apply 1 application topically 2 (two) times daily. (Patient not taking: Reported on 01/18/2015) 30 g 0  . ondansetron (ZOFRAN) 8 MG tablet Take 1 tablet (8 mg total) by mouth 2 (two) times daily. Start the day after chemo for 2 days. Then take as needed for nausea or vomiting. (Patient not taking: Reported on 01/11/2015) 30 tablet 1  . prochlorperazine (COMPAZINE) 10 MG tablet Take 1 tablet (10 mg total) by mouth every 6 (six) hours as needed (Nausea or vomiting). (Patient not taking: Reported on 01/11/2015) 30 tablet 1   No current facility-administered medications for this visit.   Facility-Administered Medications Ordered in Other Visits  Medication Dose Route  Frequency Provider Last Rate Last Dose  . sodium chloride 0.9 % injection 10 mL  10 mL Intracatheter PRN Chauncey Cruel, MD   10 mL at 12/28/14 1141  . sodium chloride 0.9 % injection 3 mL  3 mL Intravenous PRN Chauncey Cruel, MD      . vancomycin (VANCOCIN) 1,250 mg in sodium chloride 0.9 % 500 mL IVPB  1,250 mg Intravenous Once Chauncey Cruel, MD          OBJECTIVE: Middle-aged white woman in no acute distress Filed Vitals:   01/26/15 1313  BP: 121/68  Pulse: 66  Temp: 98.4 F (36.9 C)  Resp: 18     Body mass index is 26.32 kg/(m^2).    ECOG FS:1 - Symptomatic but completely ambulatory Filed Weights   01/26/15 1313  Weight: 168 lb 1.6 oz (76.25 kg)   Skin: warm, dry  HEENT: sclerae anicteric, conjunctivae pink, oropharynx clear. No thrush or mucositis.  Lymph Nodes: No cervical or supraclavicular lymphadenopathy  Lungs: clear to auscultation bilaterally, no rales, wheezes, or rhonci  Heart: regular rate and rhythm  Abdomen: round, soft, non tender, positive bowel sounds  Musculoskeletal: No focal spinal tenderness, no peripheral edema  Neuro: non focal, well oriented, positive affect  Breasts: deferred  LAB RESULTS:   Lab Results  Component Value Date   WBC 6.9 01/18/2015   NEUTROABS 5.1 01/18/2015   HGB 12.5 01/18/2015   HCT 36.7 01/18/2015   MCV 104.3* 01/18/2015   PLT 215 01/18/2015      Chemistry      Component Value Date/Time   NA 139 01/18/2015 1140   NA 137 01/08/2015 1020   K 3.6 01/18/2015 1140   K 3.9 01/08/2015 1020   CL 107 01/08/2015 1020   CL 101 10/18/2012 0859   CO2 27 01/18/2015 1140   CO2 23 01/08/2015 1020   BUN 20.3 01/18/2015 1140   BUN 13 01/08/2015 1020   CREATININE 0.7 01/18/2015 1140   CREATININE 0.69 01/08/2015 1020      Component Value Date/Time   CALCIUM 9.0 01/18/2015 1140   CALCIUM 9.0 01/08/2015 1020   ALKPHOS 75 01/18/2015 1140   ALKPHOS 94 11/17/2014 2205   AST 12 01/18/2015 1140   AST 22 11/17/2014 2205    ALT 13 01/18/2015 1140   ALT 20 11/17/2014 2205   BILITOT 0.61 01/18/2015 1140   BILITOT 0.6 11/17/2014 2205       STUDIES: Dg Chest 2 View  01/13/2015   CLINICAL DATA:  Diffuse chest, side, and back pain, history metastatic RIGHT breast cancer  EXAM: CHEST  2 VIEW  COMPARISON:  11/18/2014  FINDINGS: Normal heart size, mediastinal contours, and pulmonary vascularity.  Interval removal of LEFT jugular Port-A-Cath.  Improved aeration in the lower RIGHT chest since previous exam with decreased pleural effusion and basilar atelectasis.  Emphysematous and bronchitic changes consistent with COPD.  Linear subsegmental atelectasis RIGHT base, minimally LEFT costophrenic angle.  Remaining lungs clear.  No pneumothorax or acute osseous findings.  Post RIGHT mastectomy and axillary node dissection.  IMPRESSION: Chronic bronchitic changes.  Mild residual subsegmental atelectasis RIGHT base with significantly improved RIGHT basilar aeration and resolution of RIGHT pleural effusion since previous exam.   Electronically Signed   By: Lavonia Dana M.D.   On: 01/13/2015 12:06   Ct Angio Chest Pe W/cm &/or Wo Cm  01/13/2015   CLINICAL DATA:  Bilateral back pain, beginning Monday, worsening today. History of right breast cancer and prior right mastectomy and chemotherapy. Evaluate for pulmonary embolus.  EXAM: CT ANGIOGRAPHY CHEST WITH CONTRAST  TECHNIQUE: Multidetector CT imaging of the chest was performed using the standard protocol during bolus administration of intravenous contrast. Multiplanar CT image reconstructions and MIPs were obtained to evaluate the vascular anatomy.  CONTRAST:  144m OMNIPAQUE IOHEXOL 350 MG/ML SOLN  COMPARISON:  PET CT 01/05/2015  FINDINGS: No filling defects in the pulmonary arteries to suggest pulmonary emboli. Heart is normal size. Aorta is normal caliber. No mediastinal, hilar, or axillary adenopathy.  Presumed recent removal of left Port-A-Cath. There is gas within the port pocket within  the left upper chest wall. Prior right mastectomy.  There is a small right pleural effusion, slightly decreased since PET CT. Airspace disease anteriorly in the right upper lobe likely reflects radiation change. Mild fullness of the scratch head mild soft tissue fullness of the right hilum is stable since prior PET CT. No visible adenopathy. Minimal dependent ground-glass opacities in both lower lobes, likely atelectasis.  Imaging into the upper abdomen shows no acute findings. Previously described hepatic disease by PET CT cannot be appreciated on today's study.  Area of sclerosis noted in the superior endplate of T9, presumably bony metastatic disease. This is stable since recent PET CT.  Review of the MIP images confirms the above findings.  IMPRESSION: No evidence of pulmonary embolus.  Small right pleural effusion, decreased since prior PET CT. Stable right hilar soft tissue fullness. Stable airspace disease anteriorly in the right upper lobe, likely postradiation changes.  Presumed metastatic disease in the superior T9 vertebral body.   Electronically Signed   By: KRolm BaptiseM.D.   On: 01/13/2015 14:07   Nm Pet Image Restag (ps) Skull Base To Thigh  01/05/2015   CLINICAL DATA:  Subsequent treatment strategy for breast cancer.  EXAM: NUCLEAR MEDICINE PET SKULL BASE TO THIGH  TECHNIQUE: 8.47 mCi F-18 FDG was injected intravenously. Full-ring PET imaging was performed from the skull base to thigh after the radiotracer. CT data was obtained and used for attenuation correction and anatomic localization.  FASTING BLOOD GLUCOSE:  Value: 94 mg/dl  COMPARISON:  11/18/2014  FINDINGS: NECK  Resolution lower cervical hypermetabolic lymphadenopathy. A few tiny scattered lymph nodes are noted.  CHEST  A left-sided Port-A-Cath is in place. Status post right mastectomy and right lymph node dissection. No residual right axillary lymphadenopathy. No hypermetabolism. No left-sided breast mass or left-sided adenopathy.   Resolution of hypermetabolic mediastinal and hilar lymphadenopathy. There is some residual fullness in the right hilum but no hypermetabolism.  There is a persistent right-sided pleural effusion. Focus of  hypermetabolism in the anterior aspect of the right upper lobe is most likely radiation change. No persistent hypermetabolic pulmonary metastatic disease. Persistent moderate interstitial thickening in the right lower lobe. No new lung lesions.  ABDOMEN/PELVIS  Near complete resolution of hepatic metastatic disease. There is a faintly metabolically active lesion in segment 4 a. SUV max is 4.5. The adrenal metastatic lesions have resolved. No enlarged or hypermetabolic abdominal/pelvic lymphadenopathy.  SKELETON  Diffuse osseous uptake is likely due to rebound from chemotherapy or marrow stimulating drugs.  IMPRESSION: 1. Excellent response to therapy is demonstrated with resolution of neck and chest lymphadenopathy. The pulmonary metastatic disease is markedly improved. Near complete resolution of liver metastasis and adrenal gland metastasis. 2. Hypermetabolic area in the right upper lobe may be related to radiation change. 3. One residual mildly hypermetabolic liver metastasis. 4. Diffuse osseous uptake is likely due to rebound or marrow stimulating drugs. 5. Persistent right pleural effusion.   Electronically Signed   By: Marijo Sanes M.D.   On: 01/05/2015 10:14   Ir Removal [employer] Access W/ Port W/o Fl Mod Sed  01/08/2015   CLINICAL DATA:  Infected left jugular Port-A-Cath. Examination of the left chest demonstrates erythema without evidence of pus. Examination of the port site demonstrates no evidence of fluctuance.  EXAM: REMOVAL RIGHT IJ VEIN PORT-A-CATH  PROCEDURE: The left chest was prepped and draped in a sterile fashion. Lidocaine was utilized for local anesthesia. An incision was made over the previously healed surgical incision. There is no evidence of pus or fluid within the port pocket. Hopefully,  there is only cellulitis in the overlying skin. Therefore, the plan is to close the incision by primary intention. Utilizing blunt dissection, the port catheter and reservoir were removed from the underlying subcutaneous tissue in their entirety. Securing sutures were also removed. The pocket was irrigated with a copious amount of sterile normal saline. The pocket was closed with interrupted 3-0 Vicryl stitches. The subcutaneous tissue was closed with 3-0 Vicryl interrupted subcutaneous stitches. A 4-0 Vicryl running subcuticular stitch was utilized to approximate the skin. Dermabond was applied.  IMPRESSION: Successful left IJ vein Port-A-Cath explant. There is a possibility of pocket infection. The patient was instructed to contact us immediately if erythema or swelling increases at the port site. If this occurs, the site may need to be opened and packed with iodoform gauze.   Electronically Signed   By: Marybelle Killings M.D.   On: 01/08/2015 14:33    ASSESSMENT: 49 y.o. BRCA negative [place] woman with triple-negative stage IV breast cancer  (1) an status post right breast upper outer quadrant and right axillary lymph node biopsy 04/04/2012, both positive for an invasive ductal carcinoma, grade 3, triple negative, with an MIB-1 of 25%  (2) Treated neoadjuvantly with  (a) fluorouracil, cyclophosphamide, and epirubicin (at 100 mg/M2) x4 completed 06/07/2012  (b) docetaxel (75 mg/M2) for one dose, 06/21/2012, poorly tolerated  (c) carboplatin and gemcitabine given every 21 days for 6 cycles completed 10/11/2012  (3) status post right modified radical mastectomy 11/18/2012 showing a complete pathologic response--all 16 lymph nodes were benign  (a) no plans for reconstructon  (4) adjuvant radiation therapy completed 03/06/2013  METASTATIC DISEASE June 2016 (5) 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, 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  (6) staging PET scan 11/06/2014 showed  significant disease in the middle and lower lobes of the Right lung, Right effusion, multiple liver and bone lesions, as well as left lung nodules, a Right adrenal nodule,  and widespread hypermetabolic adenopathy  (a) brain MRI 11/16/2014 showed multiple cerebellar lesions  (7) abraxane day 1 and day 8 of each 21 day cycle started 11/09/2014  (a) PET scan 01/05/2015 shows an excellent initial response after 3 cycles  (8) zolendronate started 11/16/2014, to be repeated every 12 weeks  (9) Foundation 1 study sent  11/06/2014: patient's sister in law Nicole Valentine following [phone])  (a) 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Cole, NTRK1, MYC, ARID1A and LYN, none w approved therapies  (b) pazopanib, ponatinib or crizotinib suggested as possible off-protocol options  (10) Brain metastases:  (a) s/p SRS therapy 12/11/2014 to 4 right cerebellar lesions  PLAN: Mykela is feeling well today. She will have labs and an IV started in the treatment room at the same time, so I do not have any results to review at this time. Pending a stable CBC, she will proceed with day 6, cycle 4 of abraxane as planned.  She will have a new port placed on Monday of next week. She will start radiation this upcoming Thursday.  Tiffanny will return in  2 weeks for cycle 5 of treatment. She understands and agrees with this plan. She knows the goal of treatment in her case is control. She has been encouraged to call with any issues that might arise before her next visit here.   Laurie Panda, NP   01/26/2015 1:40 PM

## 2015-01-27 DIAGNOSIS — C50911 Malignant neoplasm of unspecified site of right female breast: Secondary | ICD-10-CM | POA: Diagnosis not present

## 2015-01-28 ENCOUNTER — Ambulatory Visit
Admission: RE | Admit: 2015-01-28 | Discharge: 2015-01-28 | Disposition: A | Discharge status: Home / Self Care | Payer: BLUE CROSS/BLUE SHIELD | Source: Ambulatory Visit | Attending: Radiation Oncology | Admitting: Radiation Oncology

## 2015-01-28 ENCOUNTER — Telehealth: Payer: Self-pay | Admitting: *Deleted

## 2015-01-28 DIAGNOSIS — C50911 Malignant neoplasm of unspecified site of right female breast: Secondary | ICD-10-CM | POA: Diagnosis not present

## 2015-01-28 NOTE — Telephone Encounter (Signed)
NOTIFIED PT. THAT HER PORT A CATH PLACEMENT IS SCHEDULED AT [facility] ON 02/01/15 AT 12:30PM. SHE VOICES UNDERSTANDING.

## 2015-01-29 ENCOUNTER — Ambulatory Visit
Admission: RE | Admit: 2015-01-29 | Discharge: 2015-01-29 | Disposition: A | Discharge status: Home / Self Care | Payer: BLUE CROSS/BLUE SHIELD | Source: Ambulatory Visit | Attending: Radiation Oncology | Admitting: Radiation Oncology

## 2015-01-29 ENCOUNTER — Other Ambulatory Visit: Payer: Self-pay | Admitting: Radiology

## 2015-01-29 DIAGNOSIS — C50911 Malignant neoplasm of unspecified site of right female breast: Secondary | ICD-10-CM | POA: Diagnosis not present

## 2015-02-01 ENCOUNTER — Other Ambulatory Visit: Payer: Self-pay | Admitting: Nurse Practitioner

## 2015-02-01 ENCOUNTER — Ambulatory Visit
Admission: RE | Admit: 2015-02-01 | Discharge: 2015-02-01 | Disposition: A | Discharge status: Home / Self Care | Payer: BLUE CROSS/BLUE SHIELD | Source: Ambulatory Visit | Attending: Radiation Oncology | Admitting: Radiation Oncology

## 2015-02-01 ENCOUNTER — Encounter: Payer: Self-pay | Admitting: Radiation Oncology

## 2015-02-01 ENCOUNTER — Encounter (HOSPITAL_COMMUNITY): Payer: Self-pay

## 2015-02-01 ENCOUNTER — Ambulatory Visit (HOSPITAL_COMMUNITY)
Admission: RE | Admit: 2015-02-01 | Discharge: 2015-02-01 | Disposition: A | Discharge status: Home / Self Care | Payer: BLUE CROSS/BLUE SHIELD | Source: Ambulatory Visit | Attending: Nurse Practitioner | Admitting: Nurse Practitioner

## 2015-02-01 ENCOUNTER — Ambulatory Visit (HOSPITAL_COMMUNITY)
Admission: RE | Admit: 2015-02-01 | Discharge: 2015-02-01 | Disposition: A | Discharge status: Home / Self Care | Payer: BLUE CROSS/BLUE SHIELD | Source: Ambulatory Visit | Attending: Oncology | Admitting: Oncology

## 2015-02-01 VITALS — BP 120/71 | HR 75 | Temp 98.6°F | Resp 16 | Ht 67.0 in | Wt 168.0 lb

## 2015-02-01 DIAGNOSIS — C7951 Secondary malignant neoplasm of bone: Secondary | ICD-10-CM

## 2015-02-01 DIAGNOSIS — C50911 Malignant neoplasm of unspecified site of right female breast: Secondary | ICD-10-CM | POA: Diagnosis not present

## 2015-02-01 DIAGNOSIS — C50919 Malignant neoplasm of unspecified site of unspecified female breast: Secondary | ICD-10-CM | POA: Insufficient documentation

## 2015-02-01 LAB — CBC WITH DIFFERENTIAL/PLATELET
BASOS ABS: 0 10*3/uL (ref 0.0–0.1)
Basophils Relative: 0 % (ref 0–1)
EOS ABS: 0 10*3/uL (ref 0.0–0.7)
Eosinophils Relative: 0 % (ref 0–5)
HCT: 38.4 % (ref 36.0–46.0)
HEMOGLOBIN: 12.7 g/dL (ref 12.0–15.0)
LYMPHS PCT: 5 % — AB (ref 12–46)
Lymphs Abs: 0.7 10*3/uL (ref 0.7–4.0)
MCH: 35.1 pg — AB (ref 26.0–34.0)
MCHC: 33.1 g/dL (ref 30.0–36.0)
MCV: 106.1 fL — ABNORMAL HIGH (ref 78.0–100.0)
MONOS PCT: 4 % (ref 3–12)
Monocytes Absolute: 0.6 10*3/uL (ref 0.1–1.0)
NRBC: 1 /100{WBCs} — AB
Neutro Abs: 13.3 10*3/uL — ABNORMAL HIGH (ref 1.7–7.7)
Neutrophils Relative %: 91 % — ABNORMAL HIGH (ref 43–77)
Platelets: 151 10*3/uL (ref 150–400)
RBC: 3.62 MIL/uL — AB (ref 3.87–5.11)
RDW: 16.2 % — ABNORMAL HIGH (ref 11.5–15.5)
WBC MORPHOLOGY: INCREASED
WBC: 14.6 10*3/uL — ABNORMAL HIGH (ref 4.0–10.5)

## 2015-02-01 LAB — PROTIME-INR
INR: 1.11 (ref 0.00–1.49)
PROTHROMBIN TIME: 14.5 s (ref 11.6–15.2)

## 2015-02-01 MED ORDER — RADIAPLEXRX EX GEL
Freq: Once | CUTANEOUS | Status: AC
  Administered 2015-02-01: 12:00:00 via TOPICAL

## 2015-02-01 MED ORDER — CEFAZOLIN SODIUM-DEXTROSE 2-3 GM-% IV SOLR
2.0000 g | INTRAVENOUS | Status: AC
  Administered 2015-02-01: 2 g via INTRAVENOUS

## 2015-02-01 MED ORDER — MIDAZOLAM HCL 2 MG/2ML IJ SOLN
INTRAMUSCULAR | Status: AC
  Filled 2015-02-01: qty 6

## 2015-02-01 MED ORDER — SODIUM CHLORIDE 0.9 % IV SOLN
INTRAVENOUS | Status: DC
  Administered 2015-02-01: 13:00:00 via INTRAVENOUS

## 2015-02-01 MED ORDER — HEPARIN SOD (PORK) LOCK FLUSH 100 UNIT/ML IV SOLN
INTRAVENOUS | Status: DC | PRN
  Administered 2015-02-01: 500 [IU]

## 2015-02-01 MED ORDER — DEXAMETHASONE 4 MG PO TABS: 2.0000 mg | ORAL_TABLET | Freq: Every day | ORAL | Status: DC

## 2015-02-01 MED ORDER — MIDAZOLAM HCL 2 MG/2ML IJ SOLN
INTRAMUSCULAR | Status: DC | PRN
  Administered 2015-02-01 (×3): 1 mg via INTRAVENOUS

## 2015-02-01 MED ORDER — HEPARIN SOD (PORK) LOCK FLUSH 100 UNIT/ML IV SOLN
INTRAVENOUS | Status: AC
  Filled 2015-02-01: qty 5

## 2015-02-01 MED ORDER — FENTANYL CITRATE (PF) 100 MCG/2ML IJ SOLN
INTRAMUSCULAR | Status: DC | PRN
Start: 2015-02-01 — End: 2015-02-02
  Administered 2015-02-01: 50 ug via INTRAVENOUS

## 2015-02-01 MED ORDER — LIDOCAINE-EPINEPHRINE 2 %-1:100000 IJ SOLN
INTRAMUSCULAR | Status: AC
  Filled 2015-02-01: qty 1

## 2015-02-01 MED ORDER — MIDAZOLAM HCL 2 MG/2ML IJ SOLN
INTRAMUSCULAR | Status: AC | PRN
  Administered 2015-02-01: 1 mg via INTRAVENOUS

## 2015-02-01 MED ORDER — FENTANYL CITRATE (PF) 100 MCG/2ML IJ SOLN
INTRAMUSCULAR | Status: AC
  Filled 2015-02-01: qty 4

## 2015-02-01 MED ORDER — FENTANYL CITRATE (PF) 100 MCG/2ML IJ SOLN
INTRAMUSCULAR | Status: AC | PRN
Start: 2015-02-01 — End: 2015-02-01
  Administered 2015-02-01: 25 ug via INTRAVENOUS

## 2015-02-01 MED ORDER — CEFAZOLIN SODIUM-DEXTROSE 2-3 GM-% IV SOLR
INTRAVENOUS | Status: AC
  Filled 2015-02-01: qty 50

## 2015-02-01 NOTE — Progress Notes (Signed)
   Weekly Management Note:  Outpatient    ICD-9-CM ICD-10-CM   1. Bone metastases 198.5 C79.51 hyaluronate sodium (RADIAPLEXRX) gel    Current Dose:  9 Gy  Projected Dose: 30 Gy   Narrative:  The patient presents for routine under treatment assessment.  CBCT/MVCT images/Port film x-rays were reviewed.  The chart was checked. Some bilateral back discomfort migrating upwards, has self tapered decadron to 4mg  on some days only. No severe pain. No urinary sx  Physical Findings:  height is 5\' 7"  (1.702 m) and weight is 168 lb (76.204 kg). Her oral temperature is 98.6 F (37 C). Her blood pressure is 120/71 and her pulse is 75. Her respiration is 16 and oxygen saturation is 98%.   Wt Readings from Last 3 Encounters:  02/01/15 168 lb (76.204 kg)  01/26/15 168 lb 1.6 oz (76.25 kg)  01/18/15 166 lb 12.8 oz (75.66 kg)   NAD  Impression:  The patient is tolerating radiotherapy.  Plan:  Continue radiotherapy as planned. Take Decadron 2mg  daily.  ________________________________   Eppie Gibson, M.D.

## 2015-02-01 NOTE — Discharge Instructions (Signed)
Implanted Port Insertion, Care After °Refer to this sheet in the next few weeks. These instructions provide you with information on caring for yourself after your procedure. Your health care provider may also give you more specific instructions. Your treatment has been planned according to current medical practices, but problems sometimes occur. Call your health care provider if you have any problems or questions after your procedure. °WHAT TO EXPECT AFTER THE PROCEDURE °After your procedure, it is typical to have the following:  °· Discomfort at the port insertion site. Ice packs to the area will help. °· Bruising on the skin over the port. This will subside in 3-4 days. °HOME CARE INSTRUCTIONS °· After your port is placed, you will get a manufacturer's information card. The card has information about your port. Keep this card with you at all times.   °· Know what kind of port you have. There are many types of ports available.   °· Wear a medical alert bracelet in case of an emergency. This can help alert health care workers that you have a port.   °· The port can stay in for as long as your health care provider believes it is necessary.   °· A home health care nurse may give medicines and take care of the port.   °· You or a family member can get special training and directions for giving medicine and taking care of the port at home.   °SEEK MEDICAL CARE IF:  °· Your port does not flush or you are unable to get a blood return.   °· You have a fever or chills. °SEEK IMMEDIATE MEDICAL CARE IF: °· You have new fluid or pus coming from your incision.   °· You notice a bad smell coming from your incision site.   °· You have swelling, pain, or more redness at the incision or port site.   °· You have chest pain or shortness of breath. °Document Released: 02/26/2013 Document Revised: 05/13/2013 Document Reviewed: 02/26/2013 °[employer]® Patient Information ©2015 [employer]. This information is not intended to replace  advice given to you by your health care provider. Make sure you discuss any questions you have with your health care provider. °Implanted Port Home Guide °An implanted port is a type of central line that is placed under the skin. Central lines are used to provide IV access when treatment or nutrition needs to be given through a person's veins. Implanted ports are used for long-term IV access. An implanted port may be placed because:  °· You need IV medicine that would be irritating to the small veins in your hands or arms.   °· You need long-term IV medicines, such as antibiotics.   °· You need IV nutrition for a long period.   °· You need frequent blood draws for lab tests.   °· You need dialysis.   °Implanted ports are usually placed in the chest area, but they can also be placed in the upper arm, the abdomen, or the leg. An implanted port has two main parts:  °· Reservoir. The reservoir is round and will appear as a small, raised area under your skin. The reservoir is the part where a needle is inserted to give medicines or draw blood.   °· Catheter. The catheter is a thin, flexible tube that extends from the reservoir. The catheter is placed into a large vein. Medicine that is inserted into the reservoir goes into the catheter and then into the vein.   °HOW WILL I CARE FOR MY INCISION SITE? °Do not get the incision site wet. Bathe or   shower as directed by your health care provider.  °HOW IS MY PORT ACCESSED? °Special steps must be taken to access the port:  °· Before the port is accessed, a numbing cream can be placed on the skin. This helps numb the skin over the port site.   °· Your health care provider uses a sterile technique to access the port. °· Your health care provider must put on a mask and sterile gloves. °· The skin over your port is cleaned carefully with an antiseptic and allowed to dry. °· The port is gently pinched between sterile gloves, and a needle is inserted into the port. °· Only  "non-coring" port needles should be used to access the port. Once the port is accessed, a blood return should be checked. This helps ensure that the port is in the vein and is not clogged.   °· If your port needs to remain accessed for a constant infusion, a clear (transparent) bandage will be placed over the needle site. The bandage and needle will need to be changed every week, or as directed by your health care provider.   °· Keep the bandage covering the needle clean and dry. Do not get it wet. Follow your health care provider's instructions on how to take a shower or bath while the port is accessed.   °· If your port does not need to stay accessed, no bandage is needed over the port.   °WHAT IS FLUSHING? °Flushing helps keep the port from getting clogged. Follow your health care provider's instructions on how and when to flush the port. Ports are usually flushed with saline solution or a medicine called heparin. The need for flushing will depend on how the port is used.  °· If the port is used for intermittent medicines or blood draws, the port will need to be flushed:   °· After medicines have been given.   °· After blood has been drawn.   °· As part of routine maintenance.   °· If a constant infusion is running, the port may not need to be flushed.   °HOW LONG WILL MY PORT STAY IMPLANTED? °The port can stay in for as long as your health care provider thinks it is needed. When it is time for the port to come out, surgery will be done to remove it. The procedure is similar to the one performed when the port was put in.  °WHEN SHOULD I SEEK IMMEDIATE MEDICAL CARE? °When you have an implanted port, you should seek immediate medical care if:  °· You notice a bad smell coming from the incision site.   °· You have swelling, redness, or drainage at the incision site.   °· You have more swelling or pain at the port site or the surrounding area.   °· You have a fever that is not controlled with medicine. °Document  Released: 05/08/2005 Document Revised: 02/26/2013 Document Reviewed: 01/13/2013 °Exit[employer]® Patient Information ©2015 [employer]. This information is not intended to replace advice given to you by your health care provider. Make sure you discuss any questions you have with your health care provider.Conscious Sedation, Adult, Care After °Refer to this sheet in the next few weeks. These instructions provide you with information on caring for yourself after your procedure. Your health care provider may also give you more specific instructions. Your treatment has been planned according to current medical practices, but problems sometimes occur. Call your health care provider if you have any problems or questions after your procedure. °WHAT TO EXPECT AFTER THE PROCEDURE  °After your procedure: °·   You may feel sleepy, clumsy, and have poor balance for several hours. °· Vomiting may occur if you eat too soon after the procedure. °HOME CARE INSTRUCTIONS °· Do not participate in any activities where you could become injured for at least 24 hours. Do not: °¨ Drive. °¨ Swim. °¨ Ride a bicycle. °¨ Operate heavy machinery. °¨ Cook. °¨ Use power tools. °¨ Climb ladders. °¨ Work from a high place. °· Do not make important decisions or sign legal documents until you are improved. °· If you vomit, drink water, juice, or soup when you can drink without vomiting. Make sure you have little or no nausea before eating solid foods. °· Only take over-the-counter or prescription medicines for pain, discomfort, or fever as directed by your health care provider. °· Make sure you and your family fully understand everything about the medicines given to you, including what side effects may occur. °· You should not drink alcohol, take sleeping pills, or take medicines that cause drowsiness for at least 24 hours. °· If you smoke, do not smoke without supervision. °· If you are feeling better, you may resume normal activities 24 hours after you  were sedated. °· Keep all appointments with your health care provider. °SEEK MEDICAL CARE IF: °· Your skin is pale or bluish in color. °· You continue to feel nauseous or vomit. °· Your pain is getting worse and is not helped by medicine. °· You have bleeding or swelling. °· You are still sleepy or feeling clumsy after 24 hours. °SEEK IMMEDIATE MEDICAL CARE IF: °· You develop a rash. °· You have difficulty breathing. °· You develop any type of allergic problem. °· You have a fever. °MAKE SURE YOU: °· Understand these instructions. °· Will watch your condition. °· Will get help right away if you are not doing well or get worse. °Document Released: 02/26/2013 Document Reviewed: 02/26/2013 °[employer]® Patient Information ©2015 [employer]. This information is not intended to replace advice given to you by your health care provider. Make sure you discuss any questions you have with your health care provider. ° °

## 2015-02-01 NOTE — Procedures (Signed)
Successful LT IJ POWER PORT REMOVAL NO COMP STABLE TIP SVC/RA NO COMP READY FOR USE

## 2015-02-01 NOTE — Progress Notes (Signed)
Nicole Valentine has completed 3 fractions to her spine.  She denies pain today but reports having soreness by her kidneys last night that has moved up her back this morning.  She is taking decadron 4 mg occasionally and took 1 today.  She denies trouble swallowing.  Reviewed post sim ed with side effects of fatigue, skin irritation and esophagitis.  She has been given radiaplex and was instructed to use it after treatment and at bedtime..  BP 120/71 mmHg  Pulse 75  Temp(Src) 98.6 F (37 C) (Oral)  Resp 16  Ht 5\' 7"  (1.702 m)  Wt 168 lb (76.204 kg)  BMI 26.31 kg/m2  SpO2 98%  LMP 03/29/2012   Wt Readings from Last 3 Encounters:  02/01/15 168 lb (76.204 kg)  01/26/15 168 lb 1.6 oz (76.25 kg)  01/18/15 166 lb 12.8 oz (75.66 kg)

## 2015-02-01 NOTE — Progress Notes (Signed)
Patient ID: Nicole Valentine, female   DOB: 1966/04/29, 49 y.o.   MRN: [id number]    Referring Physician(s): Laurie Panda  Chief Complaint:  Breast cancer  Subjective:  Pt familiar to IR service from prior port a cath placement on 11/12/14 and subsequent removal on 01/08/15 secondary to presumed infection. She has known metastatic breast cancer (initially diagnosed on right in 2013). Cath tip cx's were negative as well as blood and urine cx's. She was treated with both oral and IV antibiotic therapy pre and post port removal. She presents again today for new port a cath placement for chemotherapy. She is also receiving radiation therapy to the thoracic spine . She denies recent fevers. Chills, HA,CP, worsening dyspnea, abd pain,N/V or abnormal bleeding. She does c/o occ back pain and cough.   Allergies: Almond meal; Almond oil; Other; Tegaderm ag mesh; and Vancomycin  Medications: Prior to Admission medications   Medication Sig Start Date End Date Taking? Authorizing Provider  cholecalciferol (VITAMIN D) 1000 UNITS tablet Take 2,000 Units by mouth 2 (two) times daily.    Yes Historical Provider, MD  dexamethasone (DECADRON) 4 MG tablet Take 0.5 tablets (2 mg total) by mouth daily. 02/01/15  Yes Eppie Gibson, MD  fluconazole (DIFLUCAN) 150 MG tablet Take 1 tablet (150 mg total) by mouth daily. Take 1 tablet today. May repeat dose once in 3 days. 01/11/15  Yes Laurie Panda, NP  ibuprofen (ADVIL,MOTRIN) 200 MG tablet Take 400 mg by mouth every 6 (six) hours as needed for headache or moderate pain.   Yes Historical Provider, MD  levothyroxine (SYNTHROID, LEVOTHROID) 175 MCG tablet Take 175 mcg by mouth daily before breakfast.   Yes Historical Provider, MD  Loratadine-Pseudoephedrine (CLARITIN-D 12 HOUR PO) Take 1 tablet by mouth 2 (two) times daily as needed (allergies).   Yes Historical Provider, MD  LORazepam (ATIVAN) 0.5 MG tablet Take 0.5 mg by mouth at bedtime as needed for sleep.   11/06/14  Yes Historical Provider, MD  saccharomyces boulardii (FLORASTOR) 250 MG capsule Take 1 capsule (250 mg total) by mouth 2 (two) times daily. 11/19/14  Yes [name], MD  nystatin cream (MYCOSTATIN) Apply 1 application topically 2 (two) times daily. Patient not taking: Reported on 01/18/2015 01/11/15   Laurie Panda, NP  ondansetron (ZOFRAN) 8 MG tablet Take 1 tablet (8 mg total) by mouth 2 (two) times daily. Start the day after chemo for 2 days. Then take as needed for nausea or vomiting. Patient not taking: Reported on 01/11/2015 11/06/14   Chauncey Cruel, MD  [place]    Historical Provider, MD  prochlorperazine (COMPAZINE) 10 MG tablet Take 1 tablet (10 mg total) by mouth every 6 (six) hours as needed (Nausea or vomiting). Patient not taking: Reported on 01/11/2015 11/06/14   Chauncey Cruel, MD     Vital Signs: BP 124/72 mmHg  Pulse 73  Temp(Src) 98.1 F (36.7 C) (Oral)  Resp 16  SpO2 97%  LMP 03/29/2012  Physical Exam  Constitutional: She is oriented to person, place, and time. She appears well-developed and well-nourished.  Cardiovascular: Normal rate and regular rhythm.   Pulmonary/Chest: Effort normal and breath sounds normal.  Left upper chest region at site of previous port without significant erythema/edema  or drainage; minimal ecchymosis noted,NT  Abdominal: Soft. Bowel sounds are normal. There is no tenderness.  Musculoskeletal: Normal range of motion.  Neurological: She is alert and oriented to person, place, and time.    Imaging: No  results found.  Labs:  CBC:  Recent Labs  01/08/15 1020 01/11/15 1339 01/18/15 1140 01/26/15 1254  WBC 15.3* 9.3 6.9 6.8  HGB 12.8 13.9 12.5 12.5  HCT 38.8 41.9 36.7 37.1  PLT 165 186 215 202    COAGS:  Recent Labs  11/06/14 1118 11/12/14 1310 01/08/15 1020  INR 1.10 1.10 1.10  APTT 32 30 30    BMP:  Recent Labs  11/17/14 2205 11/18/14 0550 11/19/14 0510   01/08/15 1020 01/11/15 1340 01/18/15 1140 01/26/15 1254  NA 136 139 138  < > 137 141 139 142  K 3.5 3.6 3.2*  < > 3.9 3.5 3.6 3.4*  CL 101 103 104  --  107  --   --   --   CO2 26 28 27   < > 23 26 27 27   GLUCOSE 109* 105* 104*  < > 119* 90 96 94  BUN 15 13 7   < > 13 10.3 20.3 13.3  CALCIUM 8.2* 7.7* 7.4*  < > 9.0 9.2 9.0 9.1  CREATININE 0.58 0.58 0.59  < > 0.69 0.7 0.7 0.7  GFRNONAA >60 >60 >60  --  >60  --   --   --   GFRAA >60 >60 >60  --  >60  --   --   --   < > = values in this interval not displayed.  LIVER FUNCTION TESTS:  Recent Labs  12/28/14 0817 01/11/15 1340 01/18/15 1140 01/26/15 1254  BILITOT 0.89 0.85 0.61 0.54  AST 16 11 12 13   ALT 15 12 13 13   ALKPHOS 69 99 75 60  PROT 6.1* 6.7 6.6 6.3*  ALBUMIN 3.5 3.9 3.7 3.6    Assessment and Plan: Pt with metastatic breast cancer and recent removal of ?infected left chest wall PAC on 01/08/15, s/p antibiotic therapy. Plan is for placement of new port a cath today for chemotherapy. Risks and benefits discussed with the patient/husband including, but not limited to bleeding, infection, pneumothorax, or fibrin sheath development and need for additional procedures. All of the patient's questions were answered, patient is agreeable to proceed. Consent signed and in chart.     Signed: D. Rowe Robert 02/01/2015, 1:16 PM   I spent a total of 15 minutes at the the patient's bedside AND on the patient's hospital floor or unit, greater than 50% of which was counseling/coordinating care for port a cath placement

## 2015-02-02 ENCOUNTER — Ambulatory Visit
Admission: RE | Admit: 2015-02-02 | Discharge: 2015-02-02 | Disposition: A | Discharge status: Home / Self Care | Payer: BLUE CROSS/BLUE SHIELD | Source: Ambulatory Visit | Attending: Radiation Oncology | Admitting: Radiation Oncology

## 2015-02-02 DIAGNOSIS — C50911 Malignant neoplasm of unspecified site of right female breast: Secondary | ICD-10-CM | POA: Diagnosis not present

## 2015-02-03 ENCOUNTER — Ambulatory Visit
Admission: RE | Admit: 2015-02-03 | Discharge: 2015-02-03 | Disposition: A | Discharge status: Home / Self Care | Payer: BLUE CROSS/BLUE SHIELD | Source: Ambulatory Visit | Attending: Radiation Oncology | Admitting: Radiation Oncology

## 2015-02-03 DIAGNOSIS — C50911 Malignant neoplasm of unspecified site of right female breast: Secondary | ICD-10-CM | POA: Diagnosis not present

## 2015-02-04 ENCOUNTER — Ambulatory Visit
Admission: RE | Admit: 2015-02-04 | Discharge: 2015-02-04 | Disposition: A | Discharge status: Home / Self Care | Payer: BLUE CROSS/BLUE SHIELD | Source: Ambulatory Visit | Attending: Radiation Oncology | Admitting: Radiation Oncology

## 2015-02-04 DIAGNOSIS — C50911 Malignant neoplasm of unspecified site of right female breast: Secondary | ICD-10-CM | POA: Diagnosis not present

## 2015-02-05 ENCOUNTER — Ambulatory Visit: Payer: BLUE CROSS/BLUE SHIELD

## 2015-02-08 ENCOUNTER — Ambulatory Visit: Payer: BLUE CROSS/BLUE SHIELD

## 2015-02-08 ENCOUNTER — Ambulatory Visit
Admission: RE | Admit: 2015-02-08 | Discharge: 2015-02-08 | Disposition: A | Discharge status: Home / Self Care | Payer: BLUE CROSS/BLUE SHIELD | Source: Ambulatory Visit | Attending: Radiation Oncology | Admitting: Radiation Oncology

## 2015-02-08 ENCOUNTER — Other Ambulatory Visit: Payer: Self-pay | Admitting: Nurse Practitioner

## 2015-02-08 ENCOUNTER — Other Ambulatory Visit (HOSPITAL_BASED_OUTPATIENT_CLINIC_OR_DEPARTMENT_OTHER): Payer: BLUE CROSS/BLUE SHIELD

## 2015-02-08 ENCOUNTER — Telehealth: Payer: Self-pay | Admitting: Nurse Practitioner

## 2015-02-08 ENCOUNTER — Other Ambulatory Visit: Payer: Self-pay | Admitting: Oncology

## 2015-02-08 ENCOUNTER — Encounter: Payer: Self-pay | Admitting: Radiation Oncology

## 2015-02-08 ENCOUNTER — Ambulatory Visit (HOSPITAL_BASED_OUTPATIENT_CLINIC_OR_DEPARTMENT_OTHER): Payer: BLUE CROSS/BLUE SHIELD | Admitting: Nurse Practitioner

## 2015-02-08 ENCOUNTER — Encounter: Payer: Self-pay | Admitting: Nurse Practitioner

## 2015-02-08 VITALS — BP 118/71 | HR 97 | Temp 98.6°F | Ht 67.0 in | Wt 172.4 lb

## 2015-02-08 VITALS — BP 116/66 | HR 90 | Temp 98.1°F | Resp 16 | Ht 67.0 in | Wt 167.9 lb

## 2015-02-08 DIAGNOSIS — R42 Dizziness and giddiness: Secondary | ICD-10-CM | POA: Diagnosis not present

## 2015-02-08 DIAGNOSIS — C50911 Malignant neoplasm of unspecified site of right female breast: Secondary | ICD-10-CM | POA: Diagnosis not present

## 2015-02-08 DIAGNOSIS — C7951 Secondary malignant neoplasm of bone: Secondary | ICD-10-CM

## 2015-02-08 DIAGNOSIS — C50919 Malignant neoplasm of unspecified site of unspecified female breast: Secondary | ICD-10-CM | POA: Insufficient documentation

## 2015-02-08 DIAGNOSIS — C50411 Malignant neoplasm of upper-outer quadrant of right female breast: Secondary | ICD-10-CM | POA: Diagnosis not present

## 2015-02-08 DIAGNOSIS — E860 Dehydration: Secondary | ICD-10-CM

## 2015-02-08 DIAGNOSIS — Z452 Encounter for adjustment and management of vascular access device: Secondary | ICD-10-CM

## 2015-02-08 DIAGNOSIS — I959 Hypotension, unspecified: Secondary | ICD-10-CM

## 2015-02-08 LAB — CBC WITH DIFFERENTIAL/PLATELET
BASO%: 0.1 % (ref 0.0–2.0)
Basophils Absolute: 0 10*3/uL (ref 0.0–0.1)
EOS%: 0.5 % (ref 0.0–7.0)
Eosinophils Absolute: 0 10*3/uL (ref 0.0–0.5)
HCT: 39.9 % (ref 34.8–46.6)
HGB: 13.5 g/dL (ref 11.6–15.9)
LYMPH#: 0.4 10*3/uL — AB (ref 0.9–3.3)
LYMPH%: 4.2 % — AB (ref 14.0–49.7)
MCH: 35.8 pg — ABNORMAL HIGH (ref 25.1–34.0)
MCHC: 33.8 g/dL (ref 31.5–36.0)
MCV: 106 fL — ABNORMAL HIGH (ref 79.5–101.0)
MONO#: 0.3 10*3/uL (ref 0.1–0.9)
MONO%: 3.2 % (ref 0.0–14.0)
NEUT%: 92 % — AB (ref 38.4–76.8)
NEUTROS ABS: 9 10*3/uL — AB (ref 1.5–6.5)
PLATELETS: 148 10*3/uL (ref 145–400)
RBC: 3.76 10*6/uL (ref 3.70–5.45)
RDW: 17 % — ABNORMAL HIGH (ref 11.2–14.5)
WBC: 9.8 10*3/uL (ref 3.9–10.3)

## 2015-02-08 LAB — COMPREHENSIVE METABOLIC PANEL (CC13)
ALBUMIN: 3.2 g/dL — AB (ref 3.5–5.0)
ALK PHOS: 96 U/L (ref 40–150)
ALT: 16 U/L (ref 0–55)
AST: 10 U/L (ref 5–34)
Anion Gap: 6 mEq/L (ref 3–11)
BILIRUBIN TOTAL: 0.66 mg/dL (ref 0.20–1.20)
BUN: 13.1 mg/dL (ref 7.0–26.0)
CALCIUM: 8.4 mg/dL (ref 8.4–10.4)
CO2: 30 mEq/L — ABNORMAL HIGH (ref 22–29)
Chloride: 104 mEq/L (ref 98–109)
Creatinine: 0.7 mg/dL (ref 0.6–1.1)
Glucose: 95 mg/dl (ref 70–140)
POTASSIUM: 3.5 meq/L (ref 3.5–5.1)
Sodium: 140 mEq/L (ref 136–145)
Total Protein: 5.7 g/dL — ABNORMAL LOW (ref 6.4–8.3)

## 2015-02-08 MED ORDER — DEXAMETHASONE 4 MG PO TABS: ORAL_TABLET | ORAL | Status: DC

## 2015-02-08 MED ORDER — SODIUM CHLORIDE 0.9 % IJ SOLN
10.0000 mL | INTRAMUSCULAR | Status: DC | PRN
  Filled 2015-02-08: qty 10

## 2015-02-08 MED ORDER — HEPARIN SOD (PORK) LOCK FLUSH 100 UNIT/ML IV SOLN
500.0000 [IU] | INTRAVENOUS | Status: DC | PRN
  Filled 2015-02-08: qty 5

## 2015-02-08 MED ORDER — SODIUM CHLORIDE 0.9 % IV SOLN: Freq: Once | INTRAVENOUS | Status: DC

## 2015-02-08 MED ORDER — HEPARIN SOD (PORK) LOCK FLUSH 100 UNIT/ML IV SOLN
250.0000 [IU] | Freq: Once | INTRAVENOUS | Status: DC | PRN
  Filled 2015-02-08: qty 5

## 2015-02-08 MED ORDER — SODIUM CHLORIDE 0.9 % IV SOLN
Freq: Once | INTRAVENOUS | Status: AC
  Administered 2015-02-08: 12:00:00 via INTRAVENOUS

## 2015-02-08 MED ORDER — ALTEPLASE 2 MG IJ SOLR
2.0000 mg | Freq: Once | INTRAMUSCULAR | Status: DC | PRN
  Filled 2015-02-08: qty 2

## 2015-02-08 MED ORDER — SODIUM CHLORIDE 0.9 % IJ SOLN
3.0000 mL | Freq: Once | INTRAMUSCULAR | Status: DC | PRN
  Filled 2015-02-08: qty 10

## 2015-02-08 MED ORDER — LIDOCAINE VISCOUS 2 % MT SOLN: OROMUCOSAL | Status: DC

## 2015-02-08 MED ORDER — HEPARIN SOD (PORK) LOCK FLUSH 100 UNIT/ML IV SOLN
250.0000 [IU] | INTRAVENOUS | Status: DC | PRN
  Filled 2015-02-08: qty 5

## 2015-02-08 MED ORDER — ANTICOAGULANT SODIUM CITRATE 4% (200MG/5ML) IV SOLN
5.0000 mL | Status: DC | PRN
  Filled 2015-02-08: qty 5

## 2015-02-08 MED ORDER — HEPARIN SOD (PORK) LOCK FLUSH 100 UNIT/ML IV SOLN
500.0000 [IU] | Freq: Once | INTRAVENOUS | Status: DC
  Filled 2015-02-08: qty 5

## 2015-02-08 MED ORDER — SUCRALFATE 1 G PO TABS
ORAL_TABLET | ORAL | Status: DC
Start: 2015-02-08 — End: 2015-03-26

## 2015-02-08 NOTE — Progress Notes (Signed)
And a Patient ID: Nicole Valentine, female   DOB: Apr 27, 1966, 49 y.o.   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 She had a mammogram performed that showed dense breasts bilaterally. Ultrasound of the right breast showed 2.3 x 1.9 cm area of abnormality with multiple abnormal lymph nodes. MRI of the bilateral breasts showed asymmetrical enhancement throughout the right breast consistent with multicentric disease. An ultrasound-guided biopsy performed. The known area of disease measured 1.9 x 1.9 x 2.3 cm. Multiple abnormal positive right axillary lymph nodes were noted. 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 Biopsy of the right axillary lymph node was positive for malignancy with extracapsular extension. Patient was originally seen by Dr. [name] Dr. [name] and Dr. [name]. She has elected to have a right mastectomy eventually and declined biopsies of any other areas within the breast."  She went on to receive neoadjuvant chemotherapy and attained a complete pathologic response, as documented below  [place]den noted a very small right supraclavicular mass 09/25/2014, which she brought to our attention. She was having some allergy symptoms at the time so we decided to reevaluate this after a few weeks and on 10/22/2014 as the mass persisted we obtained a restaging neck CT scan, 10/28/2014. There was bilateral supraclavicular adenopathy, but also a large right pleural effusion was incidentally noted. We proceeded to right  thoracentesis 10/29/2014, and a liter of hazy yellow fluid was removed. Cytology from this procedure ([id number]) showed malignant cells consistent with adenocarcinoma, estrogen and progesterone receptor negative, HER-2 not amplified, with a signals ratio 1.12 and the number per cell being 2.75, and with an MIB-1 of 80%. I called Lanae with these results and set her up for a PET scan performed 11/06/2014, which shows widespread metastatic disease involving particularly the right lung, liver, and bones, but also the left lung, right adrenal, and multiple lymph node areas.  Her subsequent history is as detailed below.  INTERVAL HISTORY: Nicole Valentine returns today for follow-up of her metastatic breast cancer. Today she is due for day 1, cycle 5 of abraxane with neulasta given on day 9 for granulocyte support. Her blood pressure was normal when she was first brought back to clinic. It suddenly dropped to 78/38 after her port was accessed. The patient complained of dizziness and blurred vision. She was reclined on the exam table and fluids were administered.  REVIEW OF S/YSTEMS: Alainah denies fevers, chills, nausea or vomiting. She had some constipation this past week that is slowly resolving. She denies mouth sores, rash, or neuropathy symptoms. She is eating well and has gained some weight. She has no shortness of breath, chest pain, cough, or palpitations. A detailed review of systems is otherwise stable.  PAST MEDICAL HISTORY: Past Medical History  Diagnosis Date  . Complication of anesthesia     her father and uncle both had very difficult time waking up-was  something they told them may be hereditory. she will find out.  [name] Hypothyroidism   . 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 Radiation 01/21/13-03/06/13    Right  Breast  . Breast cancer dx'd 04-10-12-rt    PAST SURGICAL HISTORY: Past Surgical History  Procedure Laterality Date  . Wisdom tooth extraction    . Portacath placement  04/22/2012     Procedure: INSERTION PORT-A-CATH;  Surgeon: [name], MD;  Location: [facility];  Service: General;  Laterality: Left;  [name] Modified mastectomy Right 11/18/2012    Procedure:  RIGHT MODIFIED MASTECTOMY;  Surgeon: [name], MD;  Location: [facility];  Service: General;  Laterality: Right;  . Knee arthroscopy Right 11/26/2013  . Port-a-cath removal Left 12/10/2013    Procedure: MINOR REMOVAL PORT-A-CATH;  Surgeon: [name], MD;  Location: [facility];  Service: General;  Laterality: Left;    FAMILY HISTORY Family History  Problem Relation Age of Onset  . Lung cancer Maternal Grandfather 50    smoker  . Prostate cancer Maternal Grandfather 90  . Throat cancer Other     Great Aunt x 2  . Liver cancer Other     Maternal Great Grandmother  . Melanoma Maternal Uncle 81  . Brain cancer Cousin 11    non-malignant  the patient's parents are living, in their early 77s. The patient has 2 brothers, no sisters. The patient's mother was diagnosed with breast cancer, HER-2 positive, in 2014 in [place].  GYNECOLOGIC HISTORY:   (Updated 10/03/2013) Menarche age 48, first live birth age 5. She is GX P4. LMP January 2014. 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  The children are currently ages 67, 52, 48, and 31. Her husband, [name], is a Customer service manager.He just started a job for [employer].  They attend the [employer] DIRECTIVES: The patient's husband is her HCPOA   HEALTH MAINTENANCE:  (Updated 10/03/2013) Social History  Substance Use Topics  . Smoking status: Never Smoker   . Smokeless tobacco: Never Used  . Alcohol Use: No     Colonoscopy: Never  PAP: November 2013/Dr. Smith  Bone density: January 2015, [facility], normal  Lipid panel:  Not on file  Allergies  Allergen Reactions  . Almond Meal  Rash  . Almond Oil Rash  . Other Rash    LOTIONS-unknown type  . Tegaderm Ag Mesh [Silver] Rash  . Vancomycin Rash    Red Man Syndrome    Current Outpatient Prescriptions  Medication Sig Dispense Refill  . cholecalciferol (VITAMIN D) 1000 UNITS tablet Take 2,000 Units by mouth 2 (two) times daily.     [name] dexamethasone (DECADRON) 4 MG tablet Take 0.5 tablets (2 mg total) by mouth daily.    [name] ibuprofen (ADVIL,MOTRIN) 200 MG tablet Take 400 mg by mouth every 6 (six) hours as needed for headache or moderate pain.    [name] levothyroxine (SYNTHROID, LEVOTHROID) 175 MCG tablet Take 175 mcg by mouth daily before breakfast.    . Loratadine-Pseudoephedrine (CLARITIN-D 12 HOUR PO) Take 1 tablet by mouth 2 (two) times daily as needed (allergies).    . LORazepam (ATIVAN) 0.5 MG tablet Take 0.5 mg by mouth at bedtime as needed for sleep.   0  . nystatin cream (MYCOSTATIN) Apply 1 application topically 2 (two) times daily. (Patient not taking: Reported on 01/18/2015) 30 g 0  . ondansetron (ZOFRAN) 8 MG tablet Take 1 tablet (8 mg total) by mouth 2 (two) times daily. Start the day after chemo for 2 days.  Then take as needed for nausea or vomiting. (Patient not taking: Reported on 01/11/2015) 30 tablet 1  . PRESCRIPTION MEDICATION CHEMO [facility]    . prochlorperazine (COMPAZINE) 10 MG tablet Take 1 tablet (10 mg total) by mouth every 6 (six) hours as needed (Nausea or vomiting). (Patient not taking: Reported on 01/11/2015) 30 tablet 1  . saccharomyces boulardii (FLORASTOR) 250 MG capsule Take 1 capsule (250 mg total) by mouth 2 (two) times daily. 60 capsule 2   Current Facility-Administered Medications  Medication Dose Route Frequency Provider Last Rate Last Dose  . 0.9 %  sodium chloride infusion   Intravenous Once [employer], NP      . alteplase (CATHFLO ACTIVASE) injection 2 mg  2 mg Intracatheter Once PRN Laurie Panda, NP      . heparin lock flush 100 unit/mL  500 Units Intravenous Once [employer], NP      . heparin lock flush 100 unit/mL  250 Units Intracatheter Once PRN Laurie Panda, NP      . sodium chloride 0.9 % injection 10 mL  10 mL Intravenous PRN Laurie Panda, NP      . sodium chloride 0.9 % injection 10 mL  10 mL Intracatheter PRN Heather F Boelter, NP      . sodium chloride 0.9 % injection 3 mL  3 mL Intravenous Once PRN Laurie Panda, NP       Facility-Administered Medications Ordered in Other Visits  Medication Dose Route Frequency Provider Last Rate Last Dose  . alteplase (CATHFLO ACTIVASE) injection 2 mg  2 mg Intracatheter Once PRN Laurie Panda, NP      . anticoagulant sodium citrate solution 5 mL  5 mL Intracatheter PRN Heather F Boelter, NP      . heparin lock flush 100 unit/mL  250 Units Intracatheter PRN Laurie Panda, NP      . heparin lock flush 100 unit/mL  500 Units Intracatheter PRN Laurie Panda, NP      . sodium chloride 0.9 % injection 10 mL  10 mL Intracatheter PRN Chauncey Cruel, MD   10 mL at 12/28/14 1141  . sodium chloride 0.9 % injection 10 mL  10 mL Intracatheter PRN Laurie Panda, NP      . sodium chloride 0.9 % injection 10 mL  10 mL Intracatheter PRN Laurie Panda, NP      . sodium chloride 0.9 % injection 10 mL  10 mL Intracatheter PRN Laurie Panda, NP      . sodium chloride 0.9 % injection 10 mL  10 mL Intracatheter PRN Laurie Panda, NP      . sodium chloride 0.9 % injection 3 mL  3 mL Intravenous PRN Chauncey Cruel, MD      . vancomycin (VANCOCIN) 1,250 mg in sodium chloride 0.9 % 500 mL IVPB  1,250 mg Intravenous Once Chauncey Cruel, MD          OBJECTIVE: Middle-aged white woman in no acute distress Filed Vitals:   02/08/15 1318  BP: 116/66  Pulse: 90  Temp:   Resp: 16     Body mass index is 26.29 kg/(m^2).    ECOG FS:1 - Symptomatic but completely ambulatory Filed Weights   02/08/15 1036  Weight: 167 lb 14.4 oz (76.159 kg)   Skin: cold, clammy HEENT: sclerae anicteric,  conjunctivae pink, oropharynx clear. No thrush or mucositis.  Lymph Nodes: No cervical or supraclavicular lymphadenopathy  Lungs: clear to auscultation bilaterally, no rales, wheezes, or rhonci  Heart: regular rate and rhythm  Abdomen: round, soft, non tender, positive bowel sounds  Musculoskeletal: No focal spinal tenderness, no peripheral edema  Neuro: non focal, well oriented, positive affect  Breast: deferred  LAB RESULTS:   Lab Results  Component Value Date   WBC 9.8 02/08/2015   NEUTROABS 9.0* 02/08/2015   HGB 13.5 02/08/2015   HCT 39.9 02/08/2015   MCV 106.0* 02/08/2015   PLT 148 02/08/2015      Chemistry      Component Value Date/Time   NA 140 02/08/2015 1024   NA 137 01/08/2015 1020   K 3.5 02/08/2015 1024   K 3.9 01/08/2015 1020   CL 107 01/08/2015 1020   CL 101 10/18/2012 0859   CO2 30* 02/08/2015 1024   CO2 23 01/08/2015 1020   BUN 13.1 02/08/2015 1024   BUN 13 01/08/2015 1020   CREATININE 0.7 02/08/2015 1024   CREATININE 0.69 01/08/2015 1020      Component Value Date/Time   CALCIUM 8.4 02/08/2015 1024   CALCIUM 9.0 01/08/2015 1020   ALKPHOS 96 02/08/2015 1024   ALKPHOS 94 11/17/2014 2205   AST 10 02/08/2015 1024   AST 22 11/17/2014 2205   ALT 16 02/08/2015 1024   ALT 20 11/17/2014 2205   BILITOT 0.66 02/08/2015 1024   BILITOT 0.6 11/17/2014 2205       STUDIES: Dg Chest 2 View  01/13/2015   CLINICAL DATA:  Diffuse chest, side, and back pain, history metastatic RIGHT breast cancer  EXAM: CHEST  2 VIEW  COMPARISON:  11/18/2014  FINDINGS: Normal heart size, mediastinal contours, and pulmonary vascularity.  Interval removal of LEFT jugular Port-A-Cath.  Improved aeration in the lower RIGHT chest since previous exam with decreased pleural effusion and basilar atelectasis.  Emphysematous and bronchitic changes consistent with COPD.  Linear subsegmental atelectasis RIGHT base, minimally LEFT costophrenic angle.  Remaining lungs clear.  No pneumothorax or  acute osseous findings.  Post RIGHT mastectomy and axillary node dissection.  IMPRESSION: Chronic bronchitic changes.  Mild residual subsegmental atelectasis RIGHT base with significantly improved RIGHT basilar aeration and resolution of RIGHT pleural effusion since previous exam.   Electronically Signed   By: Lavonia Dana M.D.   On: 01/13/2015 12:06   Ct Angio Chest Pe W/cm &/or Wo Cm  01/13/2015   CLINICAL DATA:  Bilateral back pain, beginning Monday, worsening today. History of right breast cancer and prior right mastectomy and chemotherapy. Evaluate for pulmonary embolus.  EXAM: CT ANGIOGRAPHY CHEST WITH CONTRAST  TECHNIQUE: Multidetector CT imaging of the chest was performed using the standard protocol during bolus administration of intravenous contrast. Multiplanar CT image reconstructions and MIPs were obtained to evaluate the vascular anatomy.  CONTRAST:  118mL OMNIPAQUE IOHEXOL 350 MG/ML SOLN  COMPARISON:  PET CT 01/05/2015  FINDINGS: No filling defects in the pulmonary arteries to suggest pulmonary emboli. Heart is normal size. Aorta is normal caliber. No mediastinal, hilar, or axillary adenopathy.  Presumed recent removal of left Port-A-Cath. There is gas within the port pocket within the left upper chest wall. Prior right mastectomy.  There is a small right pleural effusion, slightly decreased since PET CT. Airspace disease anteriorly in the right upper lobe likely reflects radiation change. Mild fullness of the scratch head mild soft tissue fullness of the right hilum is stable since prior PET CT. No visible adenopathy. Minimal dependent ground-glass opacities in both lower lobes, likely atelectasis.  Imaging into  the upper abdomen shows no acute findings. Previously described hepatic disease by PET CT cannot be appreciated on today's study.  Area of sclerosis noted in the superior endplate of T9, presumably bony metastatic disease. This is stable since recent PET CT.  Review of the MIP images  confirms the above findings.  IMPRESSION: No evidence of pulmonary embolus.  Small right pleural effusion, decreased since prior PET CT. Stable right hilar soft tissue fullness. Stable airspace disease anteriorly in the right upper lobe, likely postradiation changes.  Presumed metastatic disease in the superior T9 vertebral body.   Electronically Signed   By: Rolm Baptise M.D.   On: 01/13/2015 14:07   Ir Fluoro Guide Cv Line Right  02/01/2015   CLINICAL DATA:  Metastatic breast cancer  EXAM: LEFT INTERNAL JUGULAR SINGLE LUMEN POWER PORT CATHETER INSERTION  Date:  9/12/20169/04/2015 4:08 pm  Radiologist:  M. Daryll Brod, MD  Guidance:  Ultrasound and fluoroscopic  FLUOROSCOPY TIME:  1 minutes 43 seconds, 10 mGy  MEDICATIONS AND MEDICAL HISTORY: 2 g Ancefadministered within 1 hour of the procedure.4 mg Versed, 75 mcg fentanyl  ANESTHESIA/SEDATION: 30 minutes  CONTRAST:  None.  COMPLICATIONS: None immediate  PROCEDURE: Informed consent was obtained from the patient following explanation of the procedure, risks, benefits and alternatives. The patient understands, agrees and consents for the procedure. All questions were addressed. A time out was performed.  Maximal barrier sterile technique utilized including caps, mask, sterile gowns, sterile gloves, large sterile drape, hand hygiene, and 2% chlorhexidine scrub.  Under sterile conditions and local anesthesia, right internal jugular micropuncture venous access was performed. Access was performed with ultrasound. Images were obtained for documentation. A guide wire was inserted followed by a transitional dilator. This allowed insertion of a guide wire and catheter into the IVC. Measurements were obtained from the SVC / RA junction back to the right IJ venotomy site. In the right infraclavicular chest, a subcutaneous pocket was created over the second anterior rib. This was done under sterile conditions and local anesthesia. 1% lidocaine with epinephrine was utilized  for this. A 2.5 cm incision was made in the skin. Blunt dissection was performed to create a subcutaneous pocket over the right pectoralis major muscle. The pocket was flushed with saline vigorously. There was adequate hemostasis. The port catheter was assembled and checked for leakage. The port catheter was secured in the pocket with two retention sutures. The tubing was tunneled subcutaneously to the right venotomy site and inserted into the SVC/RA junction through a valved peel-away sheath. Position was confirmed with fluoroscopy. Images were obtained for documentation. The patient tolerated the procedure well. No immediate complications. Incisions were closed in a two layer fashion with 4 - 0 Vicryl suture. Dermabond was applied to the skin. The port catheter was accessed, blood was aspirated followed by saline and heparin flushes. Needle was removed. A dry sterile dressing was applied.  IMPRESSION: Ultrasound and fluoroscopically guided right internal jugular single lumen power port catheter insertion. Tip in the SVC/RA junction. Catheter ready for use.   Electronically Signed   By: Jerilynn Mages.  Shick M.D.   On: 02/01/2015 16:28   Ir US Guide Vasc Access Right  02/01/2015   CLINICAL DATA:  Metastatic breast cancer  EXAM: LEFT INTERNAL JUGULAR SINGLE LUMEN POWER PORT CATHETER INSERTION  Date:  9/12/20169/04/2015 4:08 pm  Radiologist:  M. Daryll Brod, MD  Guidance:  Ultrasound and fluoroscopic  FLUOROSCOPY TIME:  1 minutes 43 seconds, 10 mGy  MEDICATIONS AND MEDICAL HISTORY: 2  g Ancefadministered within 1 hour of the procedure.4 mg Versed, 75 mcg fentanyl  ANESTHESIA/SEDATION: 30 minutes  CONTRAST:  None.  COMPLICATIONS: None immediate  PROCEDURE: Informed consent was obtained from the patient following explanation of the procedure, risks, benefits and alternatives. The patient understands, agrees and consents for the procedure. All questions were addressed. A time out was performed.  Maximal barrier sterile technique  utilized including caps, mask, sterile gowns, sterile gloves, large sterile drape, hand hygiene, and 2% chlorhexidine scrub.  Under sterile conditions and local anesthesia, right internal jugular micropuncture venous access was performed. Access was performed with ultrasound. Images were obtained for documentation. A guide wire was inserted followed by a transitional dilator. This allowed insertion of a guide wire and catheter into the IVC. Measurements were obtained from the SVC / RA junction back to the right IJ venotomy site. In the right infraclavicular chest, a subcutaneous pocket was created over the second anterior rib. This was done under sterile conditions and local anesthesia. 1% lidocaine with epinephrine was utilized for this. A 2.5 cm incision was made in the skin. Blunt dissection was performed to create a subcutaneous pocket over the right pectoralis major muscle. The pocket was flushed with saline vigorously. There was adequate hemostasis. The port catheter was assembled and checked for leakage. The port catheter was secured in the pocket with two retention sutures. The tubing was tunneled subcutaneously to the right venotomy site and inserted into the SVC/RA junction through a valved peel-away sheath. Position was confirmed with fluoroscopy. Images were obtained for documentation. The patient tolerated the procedure well. No immediate complications. Incisions were closed in a two layer fashion with 4 - 0 Vicryl suture. Dermabond was applied to the skin. The port catheter was accessed, blood was aspirated followed by saline and heparin flushes. Needle was removed. A dry sterile dressing was applied.  IMPRESSION: Ultrasound and fluoroscopically guided right internal jugular single lumen power port catheter insertion. Tip in the SVC/RA junction. Catheter ready for use.   Electronically Signed   By: Jerilynn Mages.  Shick M.D.   On: 02/01/2015 16:28    ASSESSMENT: 49 y.o. BRCA negative [place] woman with  triple-negative stage IV breast cancer  (1) an status post right breast upper outer quadrant and right axillary lymph node biopsy 04/04/2012, both positive for an invasive ductal carcinoma, grade 3, triple negative, with an MIB-1 of 25%  (2) Treated neoadjuvantly with  (a) fluorouracil, cyclophosphamide, and epirubicin (at 100 mg/M2) x4 completed 06/07/2012  (b) docetaxel (75 mg/M2) for one dose, 06/21/2012, poorly tolerated  (c) carboplatin and gemcitabine given every 21 days for 6 cycles completed 10/11/2012  (3) status post right modified radical mastectomy 11/18/2012 showing a complete pathologic response--all 16 lymph nodes were benign  (a) no plans for reconstructon  (4) adjuvant radiation therapy completed 03/06/2013  METASTATIC DISEASE June 2016 (5) 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, 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  (6) staging PET scan 11/06/2014 showed significant disease in the middle and lower lobes of the Right lung, Right effusion, multiple liver and bone lesions, as well as left lung nodules, a Right adrenal nodule, and widespread hypermetabolic adenopathy  (a) brain MRI 11/16/2014 showed multiple cerebellar lesions  (7) abraxane day 1 and day 8 of each 21 day cycle started 11/09/2014  (a) PET scan 01/05/2015 shows an excellent initial response after 3 cycles  (8) zolendronate started 11/16/2014, to be repeated every 12 weeks  (9) Foundation 1 study sent  11/06/2014: patient's sister in law  Garvin Fila following [phone])  (a) 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FGFR1, NTRK1, MYC, ARID1A and LYN, none w approved therapies  (b) pazopanib, ponatinib or crizotinib suggested as possible off-protocol options  (10) Brain metastases:  (a) s/p SRS therapy 12/11/2014 to 4 right cerebellar lesions  PLAN: I believe Fartun was possibly dehydrated before coming in today, and her blood pressure was exacerbated by a vagal response after having her port  accessed. After 1L of NS, her blood pressure is back to baseline and she is feeling somewhat improved. The labs were reviewed in detail and were stable.   Munira will return tomorrow for the start of cycle 5 of abraxane. She understands and agrees with this plan. She knows the goal of treatment in her case is control. She has been encouraged to call with any issues that might arise before her next visit here.   Laurie Panda, NP   02/08/2015 4:14 PM

## 2015-02-08 NOTE — Progress Notes (Signed)
   Weekly Management Note:  Outpatient    ICD-9-CM ICD-10-CM   1. Bone metastases 198.5 C79.51 sucralfate (CARAFATE) 1 G tablet     lidocaine (XYLOCAINE) 2 % solution     dexamethasone (DECADRON) 4 MG tablet    Current Dose:  21 Gy  Projected Dose: 30 Gy   Narrative:  The patient presents for routine under treatment assessment.  CBCT/MVCT images/Port film x-rays were reviewed.  The chart was checked. Back pain resolved; slight lump in esophagus felt w/ swallowing  Physical Findings:  Wt Readings from Last 3 Encounters:  02/08/15 172 lb 6.4 oz (78.2 kg)  02/08/15 167 lb 14.4 oz (76.159 kg)  02/01/15 168 lb (76.204 kg)    height is 5\' 7"  (1.702 m) and weight is 172 lb 6.4 oz (78.2 kg). Her oral temperature is 98.6 F (37 C). Her blood pressure is 118/71 and her pulse is 97. Her oxygen saturation is 98%.  no skin irritation over back  CBC    Component Value Date/Time   WBC 9.8 02/08/2015 1024   WBC 14.6* 02/01/2015 1255   RBC 3.76 02/08/2015 1024   RBC 3.62* 02/01/2015 1255   HGB 13.5 02/08/2015 1024   HGB 12.7 02/01/2015 1255   HCT 39.9 02/08/2015 1024   HCT 38.4 02/01/2015 1255   PLT 148 02/08/2015 1024   PLT 151 02/01/2015 1255   MCV 106.0* 02/08/2015 1024   MCV 106.1* 02/01/2015 1255   MCH 35.8* 02/08/2015 1024   MCH 35.1* 02/01/2015 1255   MCHC 33.8 02/08/2015 1024   MCHC 33.1 02/01/2015 1255   RDW 17.0* 02/08/2015 1024   RDW 16.2* 02/01/2015 1255   LYMPHSABS 0.4* 02/08/2015 1024   LYMPHSABS 0.7 02/01/2015 1255   MONOABS 0.3 02/08/2015 1024   MONOABS 0.6 02/01/2015 1255   EOSABS 0.0 02/08/2015 1024   EOSABS 0.0 02/01/2015 1255   BASOSABS 0.0 02/08/2015 1024   BASOSABS 0.0 02/01/2015 1255     CMP     Component Value Date/Time   NA 140 02/08/2015 1024   NA 137 01/08/2015 1020   K 3.5 02/08/2015 1024   K 3.9 01/08/2015 1020   CL 107 01/08/2015 1020   CL 101 10/18/2012 0859   CO2 30* 02/08/2015 1024   CO2 23 01/08/2015 1020   GLUCOSE 95 02/08/2015 1024     GLUCOSE 119* 01/08/2015 1020   GLUCOSE 83 10/18/2012 0859   BUN 13.1 02/08/2015 1024   BUN 13 01/08/2015 1020   CREATININE 0.7 02/08/2015 1024   CREATININE 0.69 01/08/2015 1020   CALCIUM 8.4 02/08/2015 1024   CALCIUM 9.0 01/08/2015 1020   PROT 5.7* 02/08/2015 1024   PROT 6.6 11/17/2014 2205   ALBUMIN 3.2* 02/08/2015 1024   ALBUMIN 3.6 11/17/2014 2205   AST 10 02/08/2015 1024   AST 22 11/17/2014 2205   ALT 16 02/08/2015 1024   ALT 20 11/17/2014 2205   ALKPHOS 96 02/08/2015 1024   ALKPHOS 94 11/17/2014 2205   BILITOT 0.66 02/08/2015 1024   BILITOT 0.6 11/17/2014 2205   GFRNONAA >60 01/08/2015 1020   GFRAA >60 01/08/2015 1020     Impression:  The patient is tolerating radiotherapy.   Plan:  Continue radiotherapy as planned. Rx sucralfate, lidocaine for esophagitis to use PRN.  Dexamethasone taper given.  -----------------------------------  Eppie Gibson, MD

## 2015-02-08 NOTE — Telephone Encounter (Signed)
Sharyn Lull was able to add chemo and avs printed for patient

## 2015-02-08 NOTE — Progress Notes (Signed)
Pt's port was accessed this am. Port had good blood return. Shortly after port was accessed I noticed pt's arm and hand were sweaty then pt complained of feeling lightheaded and vision was blurry. Pt's vitals were taken and she was 73/38. IV fluids were started, pt's vitals were rechecked after the 1st 15 minutes of IV fluids 119/65.Checked frequently on pt's status. No longer is her vision altered and seeing everything fine. Rechecked BP a second time at 12:49p, was 120/70. Continued to observe pt, pt is no longer lightheaded. Pt has now received 1L bag of NS. Final BP with observation was 113/66 with SATS 100% at 1:18p

## 2015-02-08 NOTE — Progress Notes (Signed)
Nicole Valentine has completed 7 fractions to her spine.  She denies pain in her back.  She reports having tightness in her chest and feels like a lump in her throat while she is eating.  She denies having bowel issues and nausea.  She reports fatigue and felt light headed yesterday. She reports feeling lightheaded this morning when her port was accessed.  She was given fluids and felt better.  Her chemo has been moved to tomorrow.  She does not have any skin irritation.  BP 118/71 mmHg  Pulse 97  Temp(Src) 98.6 F (37 C) (Oral)  Ht 5\' 7"  (1.702 m)  Wt 172 lb 6.4 oz (78.2 kg)  BMI 27.00 kg/m2  SpO2 98%  LMP 03/29/2012

## 2015-02-09 ENCOUNTER — Ambulatory Visit
Admission: RE | Admit: 2015-02-09 | Discharge: 2015-02-09 | Disposition: A | Discharge status: Home / Self Care | Payer: BLUE CROSS/BLUE SHIELD | Source: Ambulatory Visit | Attending: Radiation Oncology | Admitting: Radiation Oncology

## 2015-02-09 ENCOUNTER — Ambulatory Visit (HOSPITAL_BASED_OUTPATIENT_CLINIC_OR_DEPARTMENT_OTHER): Payer: BLUE CROSS/BLUE SHIELD

## 2015-02-09 VITALS — BP 127/69 | HR 98 | Temp 98.3°F | Resp 18

## 2015-02-09 DIAGNOSIS — C778 Secondary and unspecified malignant neoplasm of lymph nodes of multiple regions: Secondary | ICD-10-CM

## 2015-02-09 DIAGNOSIS — Z5111 Encounter for antineoplastic chemotherapy: Secondary | ICD-10-CM | POA: Diagnosis not present

## 2015-02-09 DIAGNOSIS — C787 Secondary malignant neoplasm of liver and intrahepatic bile duct: Secondary | ICD-10-CM

## 2015-02-09 DIAGNOSIS — C7801 Secondary malignant neoplasm of right lung: Secondary | ICD-10-CM | POA: Diagnosis not present

## 2015-02-09 DIAGNOSIS — C7931 Secondary malignant neoplasm of brain: Secondary | ICD-10-CM

## 2015-02-09 DIAGNOSIS — C7951 Secondary malignant neoplasm of bone: Secondary | ICD-10-CM | POA: Diagnosis not present

## 2015-02-09 DIAGNOSIS — C50919 Malignant neoplasm of unspecified site of unspecified female breast: Secondary | ICD-10-CM

## 2015-02-09 DIAGNOSIS — C7802 Secondary malignant neoplasm of left lung: Secondary | ICD-10-CM

## 2015-02-09 DIAGNOSIS — C7971 Secondary malignant neoplasm of right adrenal gland: Secondary | ICD-10-CM

## 2015-02-09 DIAGNOSIS — C50911 Malignant neoplasm of unspecified site of right female breast: Secondary | ICD-10-CM | POA: Diagnosis not present

## 2015-02-09 DIAGNOSIS — C50411 Malignant neoplasm of upper-outer quadrant of right female breast: Secondary | ICD-10-CM

## 2015-02-09 MED ORDER — SODIUM CHLORIDE 0.9 % IV SOLN: Freq: Once | INTRAVENOUS | Status: DC

## 2015-02-09 MED ORDER — ZOLEDRONIC ACID 4 MG/100ML IV SOLN
4.0000 mg | Freq: Once | INTRAVENOUS | Status: AC
  Administered 2015-02-09: 4 mg via INTRAVENOUS
  Filled 2015-02-09: qty 100

## 2015-02-09 MED ORDER — HEPARIN SOD (PORK) LOCK FLUSH 100 UNIT/ML IV SOLN
500.0000 [IU] | Freq: Once | INTRAVENOUS | Status: AC | PRN
  Administered 2015-02-09: 500 [IU]
  Filled 2015-02-09: qty 5

## 2015-02-09 MED ORDER — SODIUM CHLORIDE 0.9 % IV SOLN
Freq: Once | INTRAVENOUS | Status: AC
  Administered 2015-02-09: 15:00:00 via INTRAVENOUS
  Filled 2015-02-09: qty 4

## 2015-02-09 MED ORDER — SODIUM CHLORIDE 0.9 % IJ SOLN
10.0000 mL | INTRAMUSCULAR | Status: DC | PRN
Start: 2015-02-09 — End: 2015-02-09
  Administered 2015-02-09: 10 mL
  Filled 2015-02-09: qty 10

## 2015-02-09 MED ORDER — SODIUM CHLORIDE 0.9 % IV SOLN
Freq: Once | INTRAVENOUS | Status: AC
  Administered 2015-02-09: 15:00:00 via INTRAVENOUS

## 2015-02-09 MED ORDER — PACLITAXEL PROTEIN-BOUND CHEMO INJECTION 100 MG
100.0000 mg/m2 | Freq: Once | INTRAVENOUS | Status: AC
  Administered 2015-02-09: 200 mg via INTRAVENOUS
  Filled 2015-02-09: qty 40

## 2015-02-10 ENCOUNTER — Ambulatory Visit
Admission: RE | Admit: 2015-02-10 | Discharge: 2015-02-10 | Disposition: A | Discharge status: Home / Self Care | Payer: BLUE CROSS/BLUE SHIELD | Source: Ambulatory Visit | Attending: Radiation Oncology | Admitting: Radiation Oncology

## 2015-02-10 ENCOUNTER — Ambulatory Visit: Payer: BLUE CROSS/BLUE SHIELD

## 2015-02-10 DIAGNOSIS — C50911 Malignant neoplasm of unspecified site of right female breast: Secondary | ICD-10-CM | POA: Diagnosis not present

## 2015-02-11 ENCOUNTER — Ambulatory Visit
Admission: RE | Admit: 2015-02-11 | Discharge: 2015-02-11 | Disposition: A | Discharge status: Home / Self Care | Payer: BLUE CROSS/BLUE SHIELD | Source: Ambulatory Visit | Attending: Radiation Oncology | Admitting: Radiation Oncology

## 2015-02-11 ENCOUNTER — Encounter: Payer: Self-pay | Admitting: Radiation Therapy

## 2015-02-11 ENCOUNTER — Other Ambulatory Visit: Payer: Self-pay | Admitting: Radiation Therapy

## 2015-02-11 ENCOUNTER — Encounter: Payer: Self-pay | Admitting: Radiation Oncology

## 2015-02-11 ENCOUNTER — Ambulatory Visit: Payer: BLUE CROSS/BLUE SHIELD

## 2015-02-11 DIAGNOSIS — C7931 Secondary malignant neoplasm of brain: Secondary | ICD-10-CM

## 2015-02-11 DIAGNOSIS — C50911 Malignant neoplasm of unspecified site of right female breast: Secondary | ICD-10-CM | POA: Diagnosis not present

## 2015-02-11 NOTE — Progress Notes (Signed)
Per Dr. Isidore Moos, pt. Does not wish to continue with her current neurosurgeon should the need arise in the future for further treatment.    Her next brain MRI is scheduled for 10/25 and a follow-up to see Dr. Isidore Moos on 10/28.     Manuela Schwartz,  Please schedule an MRI of her brain in mid/late October and she will f/u with me afterwards.   Of note, she doesn't want to continue with current neurosurgeon if any NSU needs arise in future.  Thanks!  SS    Mont Dutton R.T. (R) (T) [facility] [phone] Office [phone] Pager [phone] Fax [email or web address]

## 2015-02-11 NOTE — Progress Notes (Signed)
1.  Do you need a wheel chair?    no  2. On oxygen? no  3. Have you ever had any surgery in the body part being scanned? no  4. Have you ever had any surgery on your brain or heart?  no                                                  5. Have you ever had surgery on your eyes or ears?   no                                        6. Do you have a pacemaker or defibrillator?   no  7. Do you have a Neurostimulator?   no  8. Claustrophobic?  no  9. Any risk for metal in eyes?  no  10. Injury by bullet, buckshot, or shrapnel?  no  11. Stent?  no                                                                                                                    12. Hx of Cancer?     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 Kidney or Liver disease?  yes  14. Hx of Lupus, Rheumatoid Arthritis or Scleroderma?  no  15. IV Antibiotics or long term use of NSAIDS?  no  16. HX of Hypertension?  no  17. Diabetes?  no  18. Allergy to contrast?  no  19. Recent labs. To be drawn on 10/17

## 2015-02-15 ENCOUNTER — Other Ambulatory Visit: Payer: Self-pay

## 2015-02-15 ENCOUNTER — Ambulatory Visit: Payer: BLUE CROSS/BLUE SHIELD

## 2015-02-15 ENCOUNTER — Other Ambulatory Visit (HOSPITAL_BASED_OUTPATIENT_CLINIC_OR_DEPARTMENT_OTHER): Payer: BLUE CROSS/BLUE SHIELD

## 2015-02-15 ENCOUNTER — Ambulatory Visit (HOSPITAL_BASED_OUTPATIENT_CLINIC_OR_DEPARTMENT_OTHER): Payer: BLUE CROSS/BLUE SHIELD

## 2015-02-15 ENCOUNTER — Other Ambulatory Visit: Payer: BLUE CROSS/BLUE SHIELD

## 2015-02-15 ENCOUNTER — Ambulatory Visit (HOSPITAL_BASED_OUTPATIENT_CLINIC_OR_DEPARTMENT_OTHER): Payer: BLUE CROSS/BLUE SHIELD | Admitting: Nurse Practitioner

## 2015-02-15 ENCOUNTER — Encounter: Payer: Self-pay | Admitting: Nurse Practitioner

## 2015-02-15 VITALS — BP 126/72 | HR 18 | Temp 98.2°F | Resp 18 | Ht 67.0 in | Wt 168.7 lb

## 2015-02-15 DIAGNOSIS — C50411 Malignant neoplasm of upper-outer quadrant of right female breast: Secondary | ICD-10-CM

## 2015-02-15 DIAGNOSIS — C50919 Malignant neoplasm of unspecified site of unspecified female breast: Secondary | ICD-10-CM

## 2015-02-15 DIAGNOSIS — C778 Secondary and unspecified malignant neoplasm of lymph nodes of multiple regions: Secondary | ICD-10-CM

## 2015-02-15 DIAGNOSIS — C7971 Secondary malignant neoplasm of right adrenal gland: Secondary | ICD-10-CM

## 2015-02-15 DIAGNOSIS — Z5111 Encounter for antineoplastic chemotherapy: Secondary | ICD-10-CM

## 2015-02-15 DIAGNOSIS — C7802 Secondary malignant neoplasm of left lung: Secondary | ICD-10-CM | POA: Diagnosis not present

## 2015-02-15 DIAGNOSIS — C799 Secondary malignant neoplasm of unspecified site: Secondary | ICD-10-CM

## 2015-02-15 DIAGNOSIS — C7931 Secondary malignant neoplasm of brain: Secondary | ICD-10-CM

## 2015-02-15 DIAGNOSIS — C787 Secondary malignant neoplasm of liver and intrahepatic bile duct: Secondary | ICD-10-CM

## 2015-02-15 DIAGNOSIS — C7801 Secondary malignant neoplasm of right lung: Secondary | ICD-10-CM

## 2015-02-15 DIAGNOSIS — C7951 Secondary malignant neoplasm of bone: Secondary | ICD-10-CM

## 2015-02-15 DIAGNOSIS — C50911 Malignant neoplasm of unspecified site of right female breast: Secondary | ICD-10-CM

## 2015-02-15 LAB — COMPREHENSIVE METABOLIC PANEL (CC13)
ALBUMIN: 3.2 g/dL — AB (ref 3.5–5.0)
ALK PHOS: 68 U/L (ref 40–150)
ALT: 15 U/L (ref 0–55)
ANION GAP: 7 meq/L (ref 3–11)
AST: 13 U/L (ref 5–34)
BILIRUBIN TOTAL: 0.93 mg/dL (ref 0.20–1.20)
BUN: 14.3 mg/dL (ref 7.0–26.0)
CALCIUM: 8.8 mg/dL (ref 8.4–10.4)
CO2: 29 mEq/L (ref 22–29)
CREATININE: 0.6 mg/dL (ref 0.6–1.1)
Chloride: 106 mEq/L (ref 98–109)
EGFR: >90 mL/min/{1.73_m2} (ref >90)
Glucose: 115 mg/dl (ref 70–140)
Potassium: 3.2 mEq/L — ABNORMAL LOW (ref 3.5–5.1)
Sodium: 142 mEq/L (ref 136–145)
Total Protein: 5.7 g/dL — ABNORMAL LOW (ref 6.4–8.3)

## 2015-02-15 LAB — CBC WITH DIFFERENTIAL/PLATELET
BASO%: 0.2 % (ref 0.0–2.0)
BASOS ABS: 0 10*3/uL (ref 0.0–0.1)
EOS%: 0.7 % (ref 0.0–7.0)
Eosinophils Absolute: 0 10*3/uL (ref 0.0–0.5)
HEMATOCRIT: 36.6 % (ref 34.8–46.6)
HEMOGLOBIN: 12.1 g/dL (ref 11.6–15.9)
LYMPH#: 0.4 10*3/uL — AB (ref 0.9–3.3)
LYMPH%: 9 % — ABNORMAL LOW (ref 14.0–49.7)
MCH: 35.2 pg — AB (ref 25.1–34.0)
MCHC: 33.1 g/dL (ref 31.5–36.0)
MCV: 106.4 fL — ABNORMAL HIGH (ref 79.5–101.0)
MONO#: 0.2 10*3/uL (ref 0.1–0.9)
MONO%: 5 % (ref 0.0–14.0)
NEUT#: 3.8 10*3/uL (ref 1.5–6.5)
NEUT%: 85.1 % — AB (ref 38.4–76.8)
PLATELETS: 168 10*3/uL (ref 145–400)
RBC: 3.44 10*6/uL — ABNORMAL LOW (ref 3.70–5.45)
RDW: 15.3 % — AB (ref 11.2–14.5)
WBC: 4.4 10*3/uL (ref 3.9–10.3)

## 2015-02-15 MED ORDER — SODIUM CHLORIDE 0.9 % IJ SOLN
10.0000 mL | INTRAMUSCULAR | Status: DC | PRN
  Administered 2015-02-15: 10 mL via INTRAVENOUS
  Filled 2015-02-15: qty 10

## 2015-02-15 MED ORDER — SODIUM CHLORIDE 0.9 % IV SOLN
Freq: Once | INTRAVENOUS | Status: AC
  Administered 2015-02-15: 12:00:00 via INTRAVENOUS

## 2015-02-15 MED ORDER — PEGFILGRASTIM 6 MG/0.6ML ~~LOC~~ PSKT
6.0000 mg | PREFILLED_SYRINGE | Freq: Once | SUBCUTANEOUS | Status: AC
  Administered 2015-02-15: 6 mg via SUBCUTANEOUS
  Filled 2015-02-15: qty 0.6

## 2015-02-15 MED ORDER — POTASSIUM CHLORIDE ER 10 MEQ PO TBCR: 10.0000 meq | EXTENDED_RELEASE_TABLET | Freq: Every day | ORAL | Status: DC

## 2015-02-15 MED ORDER — SODIUM CHLORIDE 0.9 % IJ SOLN
10.0000 mL | INTRAMUSCULAR | Status: DC | PRN
  Administered 2015-02-15: 10 mL
  Filled 2015-02-15: qty 10

## 2015-02-15 MED ORDER — PACLITAXEL PROTEIN-BOUND CHEMO INJECTION 100 MG
100.0000 mg/m2 | Freq: Once | INTRAVENOUS | Status: AC
  Administered 2015-02-15: 200 mg via INTRAVENOUS
  Filled 2015-02-15: qty 40

## 2015-02-15 MED ORDER — LORAZEPAM 0.5 MG PO TABS: 0.5000 mg | ORAL_TABLET | Freq: Every evening | ORAL | Status: DC | PRN

## 2015-02-15 MED ORDER — SODIUM CHLORIDE 0.9 % IV SOLN
Freq: Once | INTRAVENOUS | Status: AC
  Administered 2015-02-15: 13:00:00 via INTRAVENOUS
  Filled 2015-02-15: qty 4

## 2015-02-15 MED ORDER — HEPARIN SOD (PORK) LOCK FLUSH 100 UNIT/ML IV SOLN
500.0000 [IU] | Freq: Once | INTRAVENOUS | Status: AC | PRN
  Administered 2015-02-15: 500 [IU]
  Filled 2015-02-15: qty 5

## 2015-02-15 NOTE — Progress Notes (Signed)
K+ 3.2.  Per Dr. [name], pt will have oral supplement called into pharmacy.  Pt instructed and verbalized understanding with no further questions or concerns.

## 2015-02-15 NOTE — Patient Instructions (Signed)

## 2015-02-15 NOTE — Progress Notes (Signed)
And a Patient ID: Nicole Valentine, female   DOB: 1966/03/15, 49 y.o.   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 She had a mammogram performed that showed dense breasts bilaterally. Ultrasound of the right breast showed 2.3 x 1.9 cm area of abnormality with multiple abnormal lymph nodes. MRI of the bilateral breasts showed asymmetrical enhancement throughout the right breast consistent with multicentric disease. An ultrasound-guided biopsy performed. The known area of disease measured 1.9 x 1.9 x 2.3 cm. Multiple abnormal positive right axillary lymph nodes were noted. 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 Biopsy of the right axillary lymph node was positive for malignancy with extracapsular extension. Patient was originally seen by Dr. [name] Dr. [name] and Dr. [name]. She has elected to have a right mastectomy eventually and declined biopsies of any other areas within the breast."  She went on to receive neoadjuvant chemotherapy and attained a complete pathologic response, as documented below  Nicole Valentine noted a very small right supraclavicular mass 09/25/2014, which she brought to our attention. She was having some allergy symptoms at the time so we decided to reevaluate this after a few weeks and on 10/22/2014 as the mass persisted we obtained a restaging neck CT scan, 10/28/2014. There was bilateral supraclavicular adenopathy, but also a large right pleural effusion was incidentally noted. We proceeded to right  thoracentesis 10/29/2014, and a liter of hazy yellow fluid was removed. Cytology from this procedure ([id number]) showed malignant cells consistent with adenocarcinoma, estrogen and progesterone receptor negative, HER-2 not amplified, with a signals ratio 1.12 and the number per cell being 2.75, and with an MIB-1 of 80%. I called [facility] with these results and set her up for a PET scan performed 11/06/2014, which shows widespread metastatic disease involving particularly the right lung, liver, and bones, but also the left lung, right adrenal, and multiple lymph node areas.  Her subsequent history is as detailed below.  INTERVAL HISTORY: Nicole Valentine returns today for follow-up of her metastatic breast cancer. Today she is due for day 8, cycle 5 of abraxane with neulasta given on day 9 for granulocyte support. New this week has been light intermittent numbness to her left toes. She had originally thought this was from crossing her legs at a concert, but it appeared again a few hours later. There is none to her right feet at all, and today her left toes are normal. There is no change in sensation to her fingertips.   REVIEW OF S/YSTEMS: Nicole Valentine denies fevers, chills, nausea or vomiting. She is moving her bowels well. She denies mouth sores or rashes. Her port was accessed without difficulty. She is eating well, but has noticed more reflux. She was prescribed carafate by Dr. Isidore Moos, but she never started it. She has no shortness of breath, chest pain, cough, or palpitations. A detailed review of systems is otherwise stable.  PAST MEDICAL HISTORY: Past Medical History  Diagnosis Date  . Complication of anesthesia     her father and uncle both had very difficult time waking up-was  something they told them may  be hereditory. she will find out.  [name] Hypothyroidism   . 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 Radiation 01/21/13-03/06/13    Right Breast  . Breast cancer dx'd 04-10-12-rt    PAST SURGICAL  HISTORY: Past Surgical History  Procedure Laterality Date  . Wisdom tooth extraction    . Portacath placement  04/22/2012    Procedure: INSERTION PORT-A-CATH;  Surgeon: [name], MD;  Location: [facility];  Service: General;  Laterality: Left;  [name] Modified mastectomy Right 11/18/2012    Procedure:  RIGHT MODIFIED MASTECTOMY;  Surgeon: [name], MD;  Location: [facility];  Service: General;  Laterality: Right;  . Knee arthroscopy Right 11/26/2013  . Port-a-cath removal Left 12/10/2013    Procedure: MINOR REMOVAL PORT-A-CATH;  Surgeon: [name], MD;  Location: [facility];  Service: General;  Laterality: Left;    FAMILY HISTORY Family History  Problem Relation Age of Onset  . Lung cancer Maternal Grandfather 50    smoker  . Prostate cancer Maternal Grandfather 90  . Throat cancer Other     Great Aunt x 2  . Liver cancer Other     Maternal Great Grandmother  . Melanoma Maternal Uncle 81  . Brain cancer Cousin 11    non-malignant  the patient's parents are living, in their early 60s. The patient has 2 brothers, no sisters. The patient's mother was diagnosed with breast cancer, HER-2 positive, in 2014 in [place].  GYNECOLOGIC HISTORY:   (Updated 10/03/2013) Menarche age 19, first live birth age 39. She is GX P4. LMP January 2014. 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  The children are currently ages 50, 42, 51, and 26. Her husband, [name], is a Customer service manager.He just started a job for [employer].  They attend the [employer] DIRECTIVES: The patient's husband is her HCPOA   HEALTH MAINTENANCE:  (Updated 10/03/2013) Social History  Substance Use Topics  . Smoking status: Never Smoker   . Smokeless tobacco: Never Used  . Alcohol Use: No     Colonoscopy: Never  PAP: November 2013/Dr.  Smith  Bone density: January 2015, [facility], normal  Lipid panel:  Not on file  Allergies  Allergen Reactions  . Almond Meal Rash  . Almond Oil Rash  . Other Rash    LOTIONS-unknown type  . Tegaderm Ag Mesh [Silver] Rash  . Vancomycin Rash    Red Man Syndrome    Current Outpatient Prescriptions  Medication Sig Dispense Refill  . cholecalciferol (VITAMIN D) 1000 UNITS tablet Take 2,000 Units by mouth 2 (two) times daily.     [name] dexamethasone (DECADRON) 4 MG tablet Taper as instructed. 1/2 tablet daily until 9-26, then 1/2 tab QOD until 10/10. Then, stop. 20 tablet 0  . levothyroxine (SYNTHROID, LEVOTHROID) 175 MCG tablet Take 175 mcg by mouth daily before breakfast.    . Loratadine-Pseudoephedrine (CLARITIN-D 12 HOUR PO) Take 1 tablet by mouth 2 (two) times daily as needed (allergies).    [name] PRESCRIPTION MEDICATION CHEMO [facility]    . saccharomyces boulardii (FLORASTOR) 250 MG capsule Take 1 capsule (250 mg total) by mouth 2 (two) times daily. 60 capsule 2  . ibuprofen (ADVIL,MOTRIN) 200 MG tablet Take 400 mg by mouth every 6 (six) hours as needed for headache or moderate pain.    [name] lidocaine (  XYLOCAINE) 2 % solution Patient: Mix 1part 2% viscous lidocaine, 1part H20. Swallow 75m of this mixture, 351m before meals and at bedtime, up to QID (Patient not taking: Reported on 02/15/2015) 100 mL 3  . LORazepam (ATIVAN) 0.5 MG tablet Take 1 tablet (0.5 mg total) by mouth at bedtime as needed for sleep. 30 tablet 0  . nystatin cream (MYCOSTATIN) Apply 1 application topically 2 (two) times daily. (Patient not taking: Reported on 01/18/2015) 30 g 0  . ondansetron (ZOFRAN) 8 MG tablet Take 1 tablet (8 mg total) by mouth 2 (two) times daily. Start the day after chemo for 2 days. Then take as needed for nausea or vomiting. (Patient not taking: Reported on 01/11/2015) 30 tablet 1  . prochlorperazine (COMPAZINE) 10 MG tablet Take 1 tablet (10 mg total) by mouth every 6 (six) hours as needed (Nausea or vomiting).  (Patient not taking: Reported on 01/11/2015) 30 tablet 1  . sucralfate (CARAFATE) 1 G tablet Dissolve 1 tablet in 10 mL H20 and swallow 5 min prior to meals and bedtime. 30 tablet 3   No current facility-administered medications for this visit.   Facility-Administered Medications Ordered in Other Visits  Medication Dose Route Frequency Provider Last Rate Last Dose  . alteplase (CATHFLO ACTIVASE) injection 2 mg  2 mg Intracatheter Once PRN HeLaurie PandaNP      . anticoagulant sodium citrate solution 5 mL  5 mL Intracatheter PRN Hercules Hasler F Boelter, NP      . heparin lock flush 100 unit/mL  250 Units Intracatheter PRN HeLaurie PandaNP      . heparin lock flush 100 unit/mL  500 Units Intracatheter PRN HeLaurie PandaNP      . sodium chloride 0.9 % injection 10 mL  10 mL Intracatheter PRN GuChauncey CruelMD   10 mL at 12/28/14 1141  . sodium chloride 0.9 % injection 10 mL  10 mL Intracatheter PRN HeLaurie PandaNP      . sodium chloride 0.9 % injection 10 mL  10 mL Intracatheter PRN HeLaurie PandaNP      . sodium chloride 0.9 % injection 10 mL  10 mL Intracatheter PRN HeLaurie PandaNP      . sodium chloride 0.9 % injection 10 mL  10 mL Intracatheter PRN HeLaurie PandaNP      . sodium chloride 0.9 % injection 3 mL  3 mL Intravenous PRN GuChauncey CruelMD      . vancomycin (VANCOCIN) 1,250 mg in sodium chloride 0.9 % 500 mL IVPB  1,250 mg Intravenous Once GuChauncey CruelMD          OBJECTIVE: Middle-aged white woman in no acute distress Filed Vitals:   02/15/15 1040  BP: 126/72  Pulse: 18  Temp: 98.2 F (36.8 C)  Resp: 18     Body mass index is 26.42 kg/(m^2).    ECOG FS:1 - Symptomatic but completely ambulatory Filed Weights   02/15/15 1040  Weight: 168 lb 11.2 oz (76.522 kg)   Sclerae unicteric, pupils round and equal Oropharynx clear and moist-- no thrush or other lesions No cervical or supraclavicular adenopathy Lungs no rales or  rhonchi Heart regular rate and rhythm Abd soft, nontender, positive bowel sounds MSK no focal spinal tenderness, no upper extremity lymphedema Neuro: nonfocal, well oriented, appropriate affect Breasts: deferred  LAB RESULTS:   Lab Results  Component Value Date   WBC 4.4 02/15/2015   NEUTROABS  3.8 02/15/2015   HGB 12.1 02/15/2015   HCT 36.6 02/15/2015   MCV 106.4* 02/15/2015   PLT 168 02/15/2015      Chemistry      Component Value Date/Time   NA 142 02/15/2015 1003   NA 137 01/08/2015 1020   K 3.2* 02/15/2015 1003   K 3.9 01/08/2015 1020   CL 107 01/08/2015 1020   CL 101 10/18/2012 0859   CO2 29 02/15/2015 1003   CO2 23 01/08/2015 1020   BUN 14.3 02/15/2015 1003   BUN 13 01/08/2015 1020   CREATININE 0.6 02/15/2015 1003   CREATININE 0.69 01/08/2015 1020      Component Value Date/Time   CALCIUM 8.8 02/15/2015 1003   CALCIUM 9.0 01/08/2015 1020   ALKPHOS 68 02/15/2015 1003   ALKPHOS 94 11/17/2014 2205   AST 13 02/15/2015 1003   AST 22 11/17/2014 2205   ALT 15 02/15/2015 1003   ALT 20 11/17/2014 2205   BILITOT 0.93 02/15/2015 1003   BILITOT 0.6 11/17/2014 2205       STUDIES: Ir Fluoro Guide Cv Line Right  02/01/2015   CLINICAL DATA:  Metastatic breast cancer  EXAM: LEFT INTERNAL JUGULAR SINGLE LUMEN POWER PORT CATHETER INSERTION  Date:  9/12/20169/04/2015 4:08 pm  Radiologist:  M. Daryll Brod, MD  Guidance:  Ultrasound and fluoroscopic  FLUOROSCOPY TIME:  1 minutes 43 seconds, 10 mGy  MEDICATIONS AND MEDICAL HISTORY: 2 g Ancefadministered within 1 hour of the procedure.4 mg Versed, 75 mcg fentanyl  ANESTHESIA/SEDATION: 30 minutes  CONTRAST:  None.  COMPLICATIONS: None immediate  PROCEDURE: Informed consent was obtained from the patient following explanation of the procedure, risks, benefits and alternatives. The patient understands, agrees and consents for the procedure. All questions were addressed. A time out was performed.  Maximal barrier sterile technique  utilized including caps, mask, sterile gowns, sterile gloves, large sterile drape, hand hygiene, and 2% chlorhexidine scrub.  Under sterile conditions and local anesthesia, right internal jugular micropuncture venous access was performed. Access was performed with ultrasound. Images were obtained for documentation. A guide wire was inserted followed by a transitional dilator. This allowed insertion of a guide wire and catheter into the IVC. Measurements were obtained from the SVC / RA junction back to the right IJ venotomy site. In the right infraclavicular chest, a subcutaneous pocket was created over the second anterior rib. This was done under sterile conditions and local anesthesia. 1% lidocaine with epinephrine was utilized for this. A 2.5 cm incision was made in the skin. Blunt dissection was performed to create a subcutaneous pocket over the right pectoralis major muscle. The pocket was flushed with saline vigorously. There was adequate hemostasis. The port catheter was assembled and checked for leakage. The port catheter was secured in the pocket with two retention sutures. The tubing was tunneled subcutaneously to the right venotomy site and inserted into the SVC/RA junction through a valved peel-away sheath. Position was confirmed with fluoroscopy. Images were obtained for documentation. The patient tolerated the procedure well. No immediate complications. Incisions were closed in a two layer fashion with 4 - 0 Vicryl suture. Dermabond was applied to the skin. The port catheter was accessed, blood was aspirated followed by saline and heparin flushes. Needle was removed. A dry sterile dressing was applied.  IMPRESSION: Ultrasound and fluoroscopically guided right internal jugular single lumen power port catheter insertion. Tip in the SVC/RA junction. Catheter ready for use.   Electronically Signed   By: Jerilynn Mages.  Shick M.D.  On: 02/01/2015 16:28   Ir US Guide Vasc Access Right  02/01/2015   CLINICAL DATA:   Metastatic breast cancer  EXAM: LEFT INTERNAL JUGULAR SINGLE LUMEN POWER PORT CATHETER INSERTION  Date:  9/12/20169/04/2015 4:08 pm  Radiologist:  M. Daryll Brod, MD  Guidance:  Ultrasound and fluoroscopic  FLUOROSCOPY TIME:  1 minutes 43 seconds, 10 mGy  MEDICATIONS AND MEDICAL HISTORY: 2 g Ancefadministered within 1 hour of the procedure.4 mg Versed, 75 mcg fentanyl  ANESTHESIA/SEDATION: 30 minutes  CONTRAST:  None.  COMPLICATIONS: None immediate  PROCEDURE: Informed consent was obtained from the patient following explanation of the procedure, risks, benefits and alternatives. The patient understands, agrees and consents for the procedure. All questions were addressed. A time out was performed.  Maximal barrier sterile technique utilized including caps, mask, sterile gowns, sterile gloves, large sterile drape, hand hygiene, and 2% chlorhexidine scrub.  Under sterile conditions and local anesthesia, right internal jugular micropuncture venous access was performed. Access was performed with ultrasound. Images were obtained for documentation. A guide wire was inserted followed by a transitional dilator. This allowed insertion of a guide wire and catheter into the IVC. Measurements were obtained from the SVC / RA junction back to the right IJ venotomy site. In the right infraclavicular chest, a subcutaneous pocket was created over the second anterior rib. This was done under sterile conditions and local anesthesia. 1% lidocaine with epinephrine was utilized for this. A 2.5 cm incision was made in the skin. Blunt dissection was performed to create a subcutaneous pocket over the right pectoralis major muscle. The pocket was flushed with saline vigorously. There was adequate hemostasis. The port catheter was assembled and checked for leakage. The port catheter was secured in the pocket with two retention sutures. The tubing was tunneled subcutaneously to the right venotomy site and inserted into the SVC/RA junction  through a valved peel-away sheath. Position was confirmed with fluoroscopy. Images were obtained for documentation. The patient tolerated the procedure well. No immediate complications. Incisions were closed in a two layer fashion with 4 - 0 Vicryl suture. Dermabond was applied to the skin. The port catheter was accessed, blood was aspirated followed by saline and heparin flushes. Needle was removed. A dry sterile dressing was applied.  IMPRESSION: Ultrasound and fluoroscopically guided right internal jugular single lumen power port catheter insertion. Tip in the SVC/RA junction. Catheter ready for use.   Electronically Signed   By: Jerilynn Mages.  Shick M.D.   On: 02/01/2015 16:28    ASSESSMENT: 49 y.o. BRCA negative [place] woman with triple-negative stage IV breast cancer  (1) an status post right breast upper outer quadrant and right axillary lymph node biopsy 04/04/2012, both positive for an invasive ductal carcinoma, grade 3, triple negative, with an MIB-1 of 25%  (2) Treated neoadjuvantly with  (a) fluorouracil, cyclophosphamide, and epirubicin (at 100 mg/M2) x4 completed 06/07/2012  (b) docetaxel (75 mg/M2) for one dose, 06/21/2012, poorly tolerated  (c) carboplatin and gemcitabine given every 21 days for 6 cycles completed 10/11/2012  (3) status post right modified radical mastectomy 11/18/2012 showing a complete pathologic response--all 16 lymph nodes were benign  (a) no plans for reconstructon  (4) adjuvant radiation therapy completed 03/06/2013  METASTATIC DISEASE June 2016 (5) 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, 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  (6) staging PET scan 11/06/2014 showed significant disease in the middle and lower lobes of the Right lung, Right effusion, multiple liver and bone lesions, as well as left lung nodules, a  Right adrenal nodule, and widespread hypermetabolic adenopathy  (a) brain MRI 11/16/2014 showed multiple cerebellar lesions  (7)  abraxane day 1 and day 8 of each 21 day cycle started 11/09/2014  (a) PET scan 01/05/2015 shows an excellent initial response after 3 cycles  (8) zolendronate started 11/16/2014, to be repeated every 12 weeks  (9) Foundation 1 study sent  11/06/2014: patient's sister in law Garvin Fila following [phone])  (a) 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[place], NTRK1, MYC, ARID1A and LYN, none w approved therapies  (b) pazopanib, ponatinib or crizotinib suggested as possible off-protocol options  (10) Brain metastases:  (a) s/p SRS therapy 12/11/2014 to 4 right cerebellar lesions  PLAN: Nicole Valentine continues to manage treatment well. The labs were reviewed in detail and were stable. We reviewed her neuropathy concerns, but no symptoms are present during today's visit. She will proceed with day 8, cycle 5 of abraxane as planned.  Nicole Valentine will return in 2 weeks for the start of cycle 6. She understands and agrees with this plan. She knows the goal of treatment in her case is control. She has been encouraged to call with any issues that might arise before her next visit here.   Laurie Panda, NP   02/15/2015 11:05 AM

## 2015-02-15 NOTE — Progress Notes (Signed)
Potassium 10 meq sent to [facility] per Dr. [name], to be taken daily.  Patient informed.

## 2015-02-15 NOTE — Patient Instructions (Signed)
[facility] Discharge Instructions for Patients Receiving Chemotherapy  Today you received the following chemotherapy agents: Abraxane   To help prevent nausea and vomiting after your treatment, we encourage you to take your nausea medication as directed.    If you develop nausea and vomiting that is not controlled by your nausea medication, call the clinic.   BELOW ARE SYMPTOMS THAT SHOULD BE REPORTED IMMEDIATELY:  *FEVER GREATER THAN 100.5 F  *CHILLS WITH OR WITHOUT FEVER  NAUSEA AND VOMITING THAT IS NOT CONTROLLED WITH YOUR NAUSEA MEDICATION  *UNUSUAL SHORTNESS OF BREATH  *UNUSUAL BRUISING OR BLEEDING  TENDERNESS IN MOUTH AND THROAT WITH OR WITHOUT PRESENCE OF ULCERS  *URINARY PROBLEMS  *BOWEL PROBLEMS  UNUSUAL RASH Items with * indicate a potential emergency and should be followed up as soon as possible.  Feel free to call the clinic you have any questions or concerns. The clinic phone number is [phone].  Please show the CHEMO ALERT CARD at check-in to the Emergency Department and triage nurse.   

## 2015-02-17 ENCOUNTER — Ambulatory Visit: Payer: BLUE CROSS/BLUE SHIELD

## 2015-02-19 NOTE — Telephone Encounter (Signed)
err

## 2015-02-23 ENCOUNTER — Other Ambulatory Visit: Payer: Self-pay | Admitting: Nurse Practitioner

## 2015-02-24 ENCOUNTER — Emergency Department (HOSPITAL_COMMUNITY)
Admission: EM | Admit: 2015-02-24 | Discharge: 2015-02-25 | Disposition: A | Discharge status: Home / Self Care | Payer: BLUE CROSS/BLUE SHIELD | Attending: Emergency Medicine | Admitting: Emergency Medicine

## 2015-02-24 ENCOUNTER — Telehealth: Payer: Self-pay

## 2015-02-24 ENCOUNTER — Encounter (HOSPITAL_COMMUNITY): Payer: Self-pay | Admitting: *Deleted

## 2015-02-24 DIAGNOSIS — Z79899 Other long term (current) drug therapy: Secondary | ICD-10-CM | POA: Insufficient documentation

## 2015-02-24 DIAGNOSIS — R21 Rash and other nonspecific skin eruption: Secondary | ICD-10-CM | POA: Diagnosis present

## 2015-02-24 DIAGNOSIS — L03313 Cellulitis of chest wall: Secondary | ICD-10-CM | POA: Diagnosis not present

## 2015-02-24 DIAGNOSIS — E039 Hypothyroidism, unspecified: Secondary | ICD-10-CM | POA: Diagnosis not present

## 2015-02-24 DIAGNOSIS — L0291 Cutaneous abscess, unspecified: Secondary | ICD-10-CM

## 2015-02-24 DIAGNOSIS — Z853 Personal history of malignant neoplasm of breast: Secondary | ICD-10-CM | POA: Insufficient documentation

## 2015-02-24 DIAGNOSIS — L02213 Cutaneous abscess of chest wall: Secondary | ICD-10-CM | POA: Diagnosis not present

## 2015-02-24 DIAGNOSIS — L0390 Cellulitis, unspecified: Secondary | ICD-10-CM

## 2015-02-24 NOTE — Telephone Encounter (Signed)
Patient LVM that her mother in law died and she has to reschedule her appts.  Sent msg to scheduling.

## 2015-02-24 NOTE — ED Notes (Signed)
Pt states that she had her Port-a-cath removed on 8/19 and then today she began to have redness, swelling and pin sensation to the area; pt states that it just began this am; pt states that she had a new port placed on 9/30

## 2015-02-25 ENCOUNTER — Telehealth: Payer: Self-pay | Admitting: Oncology

## 2015-02-25 ENCOUNTER — Other Ambulatory Visit: Payer: Self-pay

## 2015-02-25 ENCOUNTER — Ambulatory Visit (HOSPITAL_BASED_OUTPATIENT_CLINIC_OR_DEPARTMENT_OTHER): Payer: BLUE CROSS/BLUE SHIELD | Admitting: Nurse Practitioner

## 2015-02-25 ENCOUNTER — Telehealth: Payer: Self-pay | Admitting: *Deleted

## 2015-02-25 ENCOUNTER — Ambulatory Visit (HOSPITAL_COMMUNITY)
Admission: RE | Admit: 2015-02-25 | Discharge: 2015-02-25 | Disposition: A | Discharge status: Home / Self Care | Payer: BLUE CROSS/BLUE SHIELD | Source: Ambulatory Visit | Attending: Nurse Practitioner | Admitting: Nurse Practitioner

## 2015-02-25 ENCOUNTER — Other Ambulatory Visit (HOSPITAL_COMMUNITY): Payer: Self-pay | Admitting: Interventional Radiology

## 2015-02-25 VITALS — BP 145/87 | HR 123 | Temp 100.4°F | Resp 18 | Ht 67.0 in | Wt 172.0 lb

## 2015-02-25 DIAGNOSIS — C7801 Secondary malignant neoplasm of right lung: Secondary | ICD-10-CM

## 2015-02-25 DIAGNOSIS — T80219A Unspecified infection due to central venous catheter, initial encounter: Secondary | ICD-10-CM

## 2015-02-25 DIAGNOSIS — C50411 Malignant neoplasm of upper-outer quadrant of right female breast: Secondary | ICD-10-CM | POA: Diagnosis not present

## 2015-02-25 DIAGNOSIS — C7971 Secondary malignant neoplasm of right adrenal gland: Secondary | ICD-10-CM

## 2015-02-25 DIAGNOSIS — E875 Hyperkalemia: Secondary | ICD-10-CM

## 2015-02-25 DIAGNOSIS — C787 Secondary malignant neoplasm of liver and intrahepatic bile duct: Secondary | ICD-10-CM

## 2015-02-25 DIAGNOSIS — T80219D Unspecified infection due to central venous catheter, subsequent encounter: Secondary | ICD-10-CM

## 2015-02-25 DIAGNOSIS — C7951 Secondary malignant neoplasm of bone: Secondary | ICD-10-CM

## 2015-02-25 DIAGNOSIS — C50911 Malignant neoplasm of unspecified site of right female breast: Secondary | ICD-10-CM

## 2015-02-25 DIAGNOSIS — C778 Secondary and unspecified malignant neoplasm of lymph nodes of multiple regions: Secondary | ICD-10-CM

## 2015-02-25 DIAGNOSIS — C7802 Secondary malignant neoplasm of left lung: Secondary | ICD-10-CM

## 2015-02-25 DIAGNOSIS — C7931 Secondary malignant neoplasm of brain: Secondary | ICD-10-CM

## 2015-02-25 LAB — CBC WITH DIFFERENTIAL/PLATELET
BASOS ABS: 0 10*3/uL (ref 0.0–0.1)
Basophils Relative: 0 %
EOS PCT: 0 %
Eosinophils Absolute: 0 10*3/uL (ref 0.0–0.7)
HEMATOCRIT: 34 % — AB (ref 36.0–46.0)
Hemoglobin: 11.2 g/dL — ABNORMAL LOW (ref 12.0–15.0)
LYMPHS ABS: 0.5 10*3/uL — AB (ref 0.7–4.0)
LYMPHS PCT: 4 %
MCH: 36.1 pg — AB (ref 26.0–34.0)
MCHC: 32.9 g/dL (ref 30.0–36.0)
MCV: 109.7 fL — AB (ref 78.0–100.0)
MONO ABS: 0.7 10*3/uL (ref 0.1–1.0)
MONOS PCT: 6 %
NEUTROS ABS: 10.9 10*3/uL — AB (ref 1.7–7.7)
Neutrophils Relative %: 90 %
PLATELETS: 123 10*3/uL — AB (ref 150–400)
RBC: 3.1 MIL/uL — ABNORMAL LOW (ref 3.87–5.11)
RDW: 16.8 % — AB (ref 11.5–15.5)
WBC: 12.1 10*3/uL — ABNORMAL HIGH (ref 4.0–10.5)

## 2015-02-25 LAB — BASIC METABOLIC PANEL
ANION GAP: 8 (ref 5–15)
BUN: 7 mg/dL (ref 6–20)
CALCIUM: 8.3 mg/dL — AB (ref 8.9–10.3)
CO2: 25 mmol/L (ref 22–32)
Chloride: 105 mmol/L (ref 101–111)
Creatinine, Ser: 0.54 mg/dL (ref 0.44–1.00)
GFR calc Af Amer: >60 mL/min (ref >60)
GLUCOSE: 110 mg/dL — AB (ref 65–99)
Potassium: 3.4 mmol/L — ABNORMAL LOW (ref 3.5–5.1)
Sodium: 138 mmol/L (ref 135–145)

## 2015-02-25 LAB — I-STAT CG4 LACTIC ACID, ED: Lactic Acid, Venous: 0.71 mmol/L (ref 0.5–2.0)

## 2015-02-25 MED ORDER — ACETAMINOPHEN 325 MG PO TABS
650.0000 mg | ORAL_TABLET | Freq: Once | ORAL | Status: AC
  Administered 2015-02-25: 650 mg via ORAL
  Filled 2015-02-25: qty 2

## 2015-02-25 MED ORDER — SODIUM CHLORIDE 0.9 % IV BOLUS (SEPSIS)
2000.0000 mL | Freq: Once | INTRAVENOUS | Status: AC
  Administered 2015-02-25: 1000 mL via INTRAVENOUS

## 2015-02-25 MED ORDER — CEPHALEXIN 500 MG PO CAPS: 500.0000 mg | ORAL_CAPSULE | Freq: Four times a day (QID) | ORAL | Status: DC

## 2015-02-25 MED ORDER — CEPHALEXIN 500 MG PO CAPS
500.0000 mg | ORAL_CAPSULE | Freq: Once | ORAL | Status: AC
  Administered 2015-02-25: 500 mg via ORAL
  Filled 2015-02-25: qty 1

## 2015-02-25 MED ORDER — SULFAMETHOXAZOLE-TRIMETHOPRIM 800-160 MG PO TABS: 1.0000 | ORAL_TABLET | Freq: Two times a day (BID) | ORAL | Status: AC

## 2015-02-25 MED ORDER — SULFAMETHOXAZOLE-TRIMETHOPRIM 800-160 MG PO TABS
1.0000 | ORAL_TABLET | Freq: Once | ORAL | Status: AC
  Administered 2015-02-25: 1 via ORAL
  Filled 2015-02-25: qty 1

## 2015-02-25 NOTE — Telephone Encounter (Signed)
Called patient with new schedule per pof and sent message to dr [name] about 11/4

## 2015-02-25 NOTE — Telephone Encounter (Signed)
Patient called stating that she was previously in the ED due to port infection that was removed 12/2014. Patient is currently on antibiotics. [employer] informed. Patient states that she can see Cyndee tomorrow. POF sent to scheduler

## 2015-02-25 NOTE — ED Notes (Signed)
Pt has an old port site that is red and has yellowish drainage, the new port site looks the same the patient reports. The patient reports that while in the waiting room she noticed a rash on her hands

## 2015-02-25 NOTE — Discharge Instructions (Signed)
You have cellulitis of the skin with a tiny bit of abscess at the old port site. Please take the antibiotics as prescribed.  Keep the area clean and dry, apply bacitracin ointment daily and take the medications provided. RETURN TO THE ER IF THERE IS INCREASED PAIN, REDNESS, PUS COMING OUT from the wound site.   Abscess An abscess is an infected area that contains a collection of pus and debris.It can occur in almost any part of the body. An abscess is also known as a furuncle or boil. CAUSES  An abscess occurs when tissue gets infected. This can occur from blockage of oil or sweat glands, infection of hair follicles, or a minor injury to the skin. As the body tries to fight the infection, pus collects in the area and creates pressure under the skin. This pressure causes pain. People with weakened immune systems have difficulty fighting infections and get certain abscesses more often.  SYMPTOMS Usually an abscess develops on the skin and becomes a painful mass that is red, warm, and tender. If the abscess forms under the skin, you may feel a moveable soft area under the skin. Some abscesses break open (rupture) on their own, but most will continue to get worse without care. The infection can spread deeper into the body and eventually into the bloodstream, causing you to feel ill.  DIAGNOSIS  Your caregiver will take your medical history and perform a physical exam. A sample of fluid may also be taken from the abscess to determine what is causing your infection. TREATMENT  Your caregiver may prescribe antibiotic medicines to fight the infection. However, taking antibiotics alone usually does not cure an abscess. Your caregiver may need to make a small cut (incision) in the abscess to drain the pus. In some cases, gauze is packed into the abscess to reduce pain and to continue draining the area. HOME CARE INSTRUCTIONS   Only take over-the-counter or prescription medicines for pain, discomfort, or  fever as directed by your caregiver.  If you were prescribed antibiotics, take them as directed. Finish them even if you start to feel better.  If gauze is used, follow your caregiver's directions for changing the gauze.  To avoid spreading the infection:  Keep your draining abscess covered with a bandage.  Wash your hands well.  Do not share personal care items, towels, or whirlpools with others.  Avoid skin contact with others.  Keep your skin and clothes clean around the abscess.  Keep all follow-up appointments as directed by your caregiver. SEEK MEDICAL CARE IF:   You have increased pain, swelling, redness, fluid drainage, or bleeding.  You have muscle aches, chills, or a general ill feeling.  You have a fever. MAKE SURE YOU:   Understand these instructions.  Will watch your condition.  Will get help right away if you are not doing well or get worse.   This information is not intended to replace advice given to you by your health care provider. Make sure you discuss any questions you have with your health care provider.   Document Released: 02/15/2005 Document Revised: 11/07/2011 Document Reviewed: 07/21/2011 Elsevier Interactive Patient Education 2016 Else[employer]  Cellulitis Cellulitis is an infection of the skin and the tissue beneath it. The infected area is usually red and tender. Cellulitis occurs most often in the arms and lower legs.  CAUSES  Cellulitis is caused by bacteria that enter the skin through cracks or cuts in the skin. The most common types of bacteria  that cause cellulitis are staphylococci and streptococci. SIGNS AND SYMPTOMS   Redness and warmth.  Swelling.  Tenderness or pain.  Fever. DIAGNOSIS  Your health care provider can usually determine what is wrong based on a physical exam. Blood tests may also be done. TREATMENT  Treatment usually involves taking an antibiotic medicine. HOME CARE INSTRUCTIONS   Take your antibiotic  medicine as directed by your health care provider. Finish the antibiotic even if you start to feel better.  Keep the infected arm or leg elevated to reduce swelling.  Apply a warm cloth to the affected area up to 4 times per day to relieve pain.  Take medicines only as directed by your health care provider.  Keep all follow-up visits as directed by your health care provider. SEEK MEDICAL CARE IF:   You notice red streaks coming from the infected area.  Your red area gets larger or turns dark in color.  Your bone or joint underneath the infected area becomes painful after the skin has healed.  Your infection returns in the same area or another area.  You notice a swollen bump in the infected area.  You develop new symptoms.  You have a fever. SEEK IMMEDIATE MEDICAL CARE IF:   You feel very sleepy.  You develop vomiting or diarrhea.  You have a general ill feeling (malaise) with muscle aches and pains.   This information is not intended to replace advice given to you by your health care provider. Make sure you discuss any questions you have with your health care provider.   Document Released: 02/15/2005 Document Revised: 01/27/2015 Document Reviewed: 07/24/2011 Elsevier Interactive Patient Education 2016 Elsevier Inc.   Cellulitis Cellulitis is an infection of the skin and the tissue beneath it. The infected area is usually red and tender. Cellulitis occurs most often in the arms and lower legs.  CAUSES  Cellulitis is caused by bacteria that enter the skin through cracks or cuts in the skin. The most common types of bacteria that cause cellulitis are staphylococci and streptococci. SIGNS AND SYMPTOMS   Redness and warmth.  Swelling.  Tenderness or pain.  Fever. DIAGNOSIS  Your health care provider can usually determine what is wrong based on a physical exam. Blood tests may also be done. TREATMENT  Treatment usually involves taking an antibiotic medicine. HOME  CARE INSTRUCTIONS   Take your antibiotic medicine as directed by your health care provider. Finish the antibiotic even if you start to feel better.  Keep the infected arm or leg elevated to reduce swelling.  Apply a warm cloth to the affected area up to 4 times per day to relieve pain.  Take medicines only as directed by your health care provider.  Keep all follow-up visits as directed by your health care provider. SEEK MEDICAL CARE IF:   You notice red streaks coming from the infected area.  Your red area gets larger or turns dark in color.  Your bone or joint underneath the infected area becomes painful after the skin has healed.  Your infection returns in the same area or another area.  You notice a swollen bump in the infected area.  You develop new symptoms.  You have a fever. SEEK IMMEDIATE MEDICAL CARE IF:   You feel very sleepy.  You develop vomiting or diarrhea.  You have a general ill feeling (malaise) with muscle aches and pains.   This information is not intended to replace advice given to you by your health care provider. Make  sure you discuss any questions you have with your health care provider.   Document Released: 02/15/2005 Document Revised: 01/27/2015 Document Reviewed: 07/24/2011 Elsevier Interactive Patient Education [employer].

## 2015-02-25 NOTE — Progress Notes (Unsigned)
Spoke with patient this morning.  She states that she was in the ER last night with her "old mediport site" which was red, tender, blistered and swollen.  She was told in the ED to follow up with the CC.  Selena Lesser, NP aware and requested that patient comes in today in case patient needs to see IR.  Patient to go out of town due to a family death.

## 2015-02-25 NOTE — ED Provider Notes (Addendum)
7CSN: [id number]     Arrival date & time 02/24/15  2311 History   By signing my name below, I, Emmanuella Mensah, attest that this documentation has been prepared under the direction and in the presence of Varney Biles, MD. Electronically Signed: Judithann Sauger, ED Scribe. 02/25/2015. 1:49 AM.     Chief Complaint  Patient presents with  . Cellulitis   The history is provided by the patient. No language interpreter was used.   HPI Comments: Nicole Valentine is a 49 y.o. female with a hx of Breast Cancer [place] who presents to the Emergency Department complaining of a gradually worsening painful area on her left upper chest onset 15 hours ago. She reports associated chills and a non-itchy, non-painful blanching rash on her right lower arm while in the waiting room. She explains her pain as "people sticking her with pins". She reports that in the past she has used Cipro which has worked well and so she had 1 left over cipro, which she took at 9 pm and her rash and swelling has improved some since. She reports that she had her port placed on her left chest November 2013 then another one September 28th, 2016. She adds that the area of redness is where her infected port was, and that it was completely removed on August 19th, 2016. She states that she is here to make sure that this area will not spread to her other port sites. Pt states she has chemo of one week on and two weeks off.  Past Medical History  Diagnosis Date  . Complication of anesthesia     her father and uncle both had very difficult time waking up-was  something they told them may be hereditory. she will find out.  [name] Hypothyroidism   . 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 Radiation 01/21/13-03/06/13    Right Breast  . Breast cancer ([place]) dx'd 04-10-12-rt   Past Surgical History  Procedure Laterality Date  . Wisdom tooth extraction    . Portacath placement  04/22/2012    Procedure: INSERTION PORT-A-CATH;  Surgeon: [name],  MD;  Location: [facility];  Service: General;  Laterality: Left;  [name] Modified mastectomy Right 11/18/2012    Procedure:  RIGHT MODIFIED MASTECTOMY;  Surgeon: [name], MD;  Location: [facility];  Service: General;  Laterality: Right;  . Knee arthroscopy Right 11/26/2013  . Port-a-cath removal Left 12/10/2013    Procedure: MINOR REMOVAL PORT-A-CATH;  Surgeon: [name], MD;  Location: [facility];  Service: General;  Laterality: Left;   Family History  Problem Relation Age of Onset  . Lung cancer Maternal Grandfather 11    smoker  . Prostate cancer Maternal Grandfather 90  . Throat cancer Other     Great Aunt x 2  . Liver cancer Other     Maternal Great Grandmother  . Melanoma Maternal Uncle 81  . Brain cancer Cousin 11    non-malignant   Social History  Substance Use Topics  . Smoking status: Never Smoker   . Smokeless tobacco: Never Used  . Alcohol Use: No   OB History    No data available     Review of Systems  Skin: Positive for color change, rash and wound.    ROS 10 Systems reviewed and are negative for acute change except as noted in the HPI.      Allergies  Almond meal; Almond oil; Other; Tegaderm ag  mesh; and Vancomycin  Home Medications   Prior to Admission medications   Medication Sig Start Date End Date Taking? Authorizing Provider  cholecalciferol (VITAMIN D) 1000 UNITS tablet Take 2,000 Units by mouth 2 (two) times daily.    Yes Historical Provider, MD  dexamethasone (DECADRON) 4 MG tablet Taper as instructed. 1/2 tablet daily until 9-26, then 1/2 tab QOD until 10/10. Then, stop. 02/08/15  Yes Eppie Gibson, MD  ibuprofen (ADVIL,MOTRIN) 200 MG tablet Take 400 mg by mouth every 6 (six) hours as needed for headache or moderate pain.   Yes Historical Provider, MD  levothyroxine (SYNTHROID, LEVOTHROID) 175 MCG tablet Take 175 mcg by mouth daily before breakfast.   Yes Historical Provider, MD  loratadine  (CLARITIN) 10 MG tablet Take 10 mg by mouth daily.   Yes Historical Provider, MD  Loratadine-Pseudoephedrine (CLARITIN-D 12 HOUR PO) Take 1 tablet by mouth 2 (two) times daily as needed (allergies).   Yes Historical Provider, MD  LORazepam (ATIVAN) 0.5 MG tablet Take 1 tablet (0.5 mg total) by mouth at bedtime as needed for sleep. 02/15/15  Yes Laurie Panda, NP  potassium chloride (K-DUR) 10 MEQ tablet Take 1 tablet (10 mEq total) by mouth daily. 02/15/15  Yes Chauncey Cruel, MD  PRESCRIPTION MEDICATION CHEMO [place]   Yes Historical Provider, MD  saccharomyces boulardii (FLORASTOR) 250 MG capsule Take 1 capsule (250 mg total) by mouth 2 (two) times daily. 11/19/14  Yes [name], MD  cephALEXin (KEFLEX) 500 MG capsule Take 1 capsule (500 mg total) by mouth 4 (four) times daily. 02/25/15   Varney Biles, MD  lidocaine (XYLOCAINE) 2 % solution Patient: Mix 1part 2% viscous lidocaine, 1part H20. Swallow 52mL of this mixture, 19min before meals and at bedtime, up to QID Patient not taking: Reported on 02/15/2015 02/08/15   Eppie Gibson, MD  nystatin cream (MYCOSTATIN) Apply 1 application topically 2 (two) times daily. Patient not taking: Reported on 01/18/2015 01/11/15   Laurie Panda, NP  ondansetron (ZOFRAN) 8 MG tablet Take 1 tablet (8 mg total) by mouth 2 (two) times daily. Start the day after chemo for 2 days. Then take as needed for nausea or vomiting. Patient not taking: Reported on 01/11/2015 11/06/14   Chauncey Cruel, MD  prochlorperazine (COMPAZINE) 10 MG tablet Take 1 tablet (10 mg total) by mouth every 6 (six) hours as needed (Nausea or vomiting). Patient not taking: Reported on 01/11/2015 11/06/14   Chauncey Cruel, MD  sucralfate (CARAFATE) 1 G tablet Dissolve 1 tablet in 10 mL H20 and swallow 5 min prior to meals and bedtime. Patient not taking: Reported on 02/25/2015 02/08/15   Eppie Gibson, MD  sulfamethoxazole-trimethoprim (BACTRIM DS,SEPTRA DS) 800-160 MG tablet Take 1 tablet  by mouth 2 (two) times daily. 02/25/15 03/04/15  Adilene Areola, MD   BP 111/61 mmHg  Pulse 91  Temp(Src) 98.5 F (36.9 C) (Oral)  Resp 20  SpO2 97%  LMP 03/29/2012 Physical Exam  Constitutional: She is oriented to person, place, and time. She appears well-developed and well-nourished.  HENT:  Head: Normocephalic and atraumatic.  Cardiovascular: Normal rate.   Tachycardia, HR:100  Pulmonary/Chest: Effort normal.  Abdominal: She exhibits no distension.  Neurological: She is alert and oriented to person, place, and time.  Skin: Skin is warm and dry. There is erythema.  9 cm diameter of erythematous rash over the left thoracic region, positive induration in center with TTP.  No drainage appreciated and no pustules.     Psychiatric:  She has a normal mood and affect.  Nursing note and vitals reviewed.         ED Course  Procedures (including critical care time) DIAGNOSTIC STUDIES: Oxygen Saturation is 96% on RA, adequate by my interpretation.    COORDINATION OF CARE: 1:46 AM- Pt advised of plan for treatment and pt agrees.    @4 :50: The cellulitis has certainly improved in the time pt has been here. She has mildly elevated WC. Vitals are stable. Pt will be discharged. She will be started on bactrim and keflex. She will see her Oncologist before she heads out [place] on Sunday. She will return to the Er if symptoms get worse.  Spoke with Radiologist - and she is at risk of line infection if there is overlying cellulitis - but the risk is likely not tremendously high if the port is not being accessed. Pt made aware of that observation by the Radiologist. Pt's Oncologist will see her soon, so if there is any reason for the port to come out, we would anticipate that they would f/u with that plan.     Labs Review Labs Reviewed  CBC WITH DIFFERENTIAL/PLATELET - Abnormal; Notable for the following:    WBC 12.1 (*)    RBC 3.10 (*)    Hemoglobin 11.2 (*)    HCT 34.0 (*)    MCV  109.7 (*)    MCH 36.1 (*)    RDW 16.8 (*)    Platelets 123 (*)    Neutro Abs 10.9 (*)    Lymphs Abs 0.5 (*)    All other components within normal limits  BASIC METABOLIC PANEL - Abnormal; Notable for the following:    Potassium 3.4 (*)    Glucose, Bld 110 (*)    Calcium 8.3 (*)    All other components within normal limits  CULTURE, BLOOD (ROUTINE X 2)  CULTURE, BLOOD (ROUTINE X 2)  WOUND CULTURE  I-STAT CG4 LACTIC ACID, ED    Imaging Review No results found. Varney Biles, MD has personally reviewed and evaluated these images and lab results as part of his medical decision-making.   EKG Interpretation None      MDM   Final diagnoses:  Cellulitis and abscess   I personally performed the services described in this documentation, which was scribed in my presence. The recorded information has been reviewed and is accurate.   PURULENCE DRAINAGE Performed by: Varney Biles Consent: Verbal consent obtained. Risks and benefits: risks, benefits and alternatives were discussed Type: abscess  Body area: Left chest  Anesthesia: NONE  NO INCISION WAS MADE  Local anesthetic: NONE  Complexity: SIMPLE  Drainage: purulent and serosanguinous  Drainage amount: trace   Patient tolerance: Patient tolerated the procedure well with no immediate complications.    PT comes in with redness. She is immunosuppressed, and she has swelling around the redness. There is induration, and what turned out to be trace purulence on investigation at the site of old port. After the old port was removed, the skin was not stitched intentionally, and i believe she might have had local skin flora create infection. She is tachycardic - 100, likely low grade temp. Pt is immunosuppressed. She took cipro prior to ER arrival -and it has worked well in the past for localized infection for her. We will get basic labs and even blood cultures (?chills). She is non toxic. The wound has been cultured.    Currently - i think pt can be discharged safely with bactrim and  keflex. If the cultures are + or if she gets worse, she will return to the ER. Pt actually prefers this plan, as she has seen marked improvement in her lesion already.   Varney Biles, MD 02/25/15 0230  Varney Biles, MD 02/25/15 [phone]

## 2015-02-26 ENCOUNTER — Telehealth (HOSPITAL_COMMUNITY): Payer: Self-pay | Admitting: Radiology

## 2015-02-26 ENCOUNTER — Encounter: Payer: BLUE CROSS/BLUE SHIELD | Admitting: Nurse Practitioner

## 2015-02-26 NOTE — Progress Notes (Signed)
  Radiation Oncology         [phone] ________________________________  Name: Nicole Valentine MRN: [id number]  Date: 02/11/2015  DOB: 08-Jan-1966  End of Treatment Note  Metastatic Breast Cancer   Indication for treatment:  Pallliative       Radiation treatment dates:  01/28/2015-02/11/2015  Site/dose:   Thoracic Spine T8-10, 30 Gy in 10 fractions  Beams/energy:   3D Conformal, 15X, 6X  Narrative: The patient tolerated radiation treatment relatively well.    Plan: The patient has completed radiation treatment. The patient will return to radiation oncology clinic for routine followup in one month. I advised them to call or return sooner if they have any questions or concerns related to their recovery or treatment.  -----------------------------------  Eppie Gibson, MD  This document serves as a record of services personally performed by Eppie Gibson, MD. It was created on her behalf by Derek Mound, a trained medical scribe. The creation of this record is based on the scribe's personal observations and the provider's statements to them. This document has been checked and approved by the attending provider.

## 2015-02-27 ENCOUNTER — Encounter: Payer: Self-pay | Admitting: Nurse Practitioner

## 2015-02-27 DIAGNOSIS — E875 Hyperkalemia: Secondary | ICD-10-CM | POA: Insufficient documentation

## 2015-02-27 DIAGNOSIS — T80219A Unspecified infection due to central venous catheter, initial encounter: Secondary | ICD-10-CM | POA: Insufficient documentation

## 2015-02-27 LAB — WOUND CULTURE
Culture: NO GROWTH
GRAM STAIN: NONE SEEN

## 2015-02-27 NOTE — Op Note (Signed)
  Name: Nicole Valentine  MRN: [id number]  Date: 12/11/2014   DOB: 1966-02-05  Stereotactic Radiosurgery Operative Note  PRE-OPERATIVE DIAGNOSIS:  Multiple Brain Metastases  POST-OPERATIVE DIAGNOSIS:  Multiple Brain Metastases  PROCEDURE:  Stereotactic Radiosurgery  SURGEON:  Bartlomiej Jenkinson L, MD  NARRATIVE: The patient underwent a radiation treatment planning session in the radiation oncology simulation suite under the care of the radiation oncology physician and physicist.  I participated closely in the radiation treatment planning afterwards. The patient underwent planning CT which was fused to 3T high resolution MRI with 1 mm axial slices.  These images were fused on the planning system.  We contoured the gross target volumes and subsequently expanded this to yield the Planning Target Volume. I actively participated in the planning process.  I helped to define and review the target contours and also the contours of the optic pathway, eyes, brainstem and selected nearby organs at risk.  All the dose constraints for critical structures were reviewed and compared to AAPM Task Group 101.  The prescription dose conformity was reviewed.  I approved the plan electronically.    Accordingly, Nicole Valentine was brought to the TrueBeam stereotactic radiation treatment linac and placed in the custom immobilization mask.  The patient was aligned according to the IR fiducial markers with BrainLab Exactrac, then orthogonal x-rays were used in ExacTrac with the 6DOF robotic table and the shifts were made to align the patient  Nicole Valentine received stereotactic radiosurgery uneventfully.    Lesions treated:  4   Complex lesions treated: 0  The detailed description of the procedure is recorded in the radiation oncology procedure note.  I was present for the duration of the procedure.  DISPOSITION:  Following delivery, the patient was transported to nursing in stable condition and monitored for possible  acute effects to be discharged to home in stable condition with follow-up in one month.  Lucee Brissett L, MD 02/27/2015 11:00 AM

## 2015-02-27 NOTE — Assessment & Plan Note (Signed)
Pt had her old left upper chest portacath removed; and a new one placed on the same side in August 2016.  She reports development of erythema, edema, warmth, tenderness, and slight drainage from site within last several days. She presented to the ED on Wednesday 02/24/15 for evaluation; and was found to have infection to the site at that time. The portacath drainage was cultured; and pt was placed on both Bactrim and Keflex abx.    Pt presented to the [facility] for further eval of her portacath site. Left upper chest site with continued erythema, warmth, mild tenderness; but no drainage.   Pt was sent to Interventional Radiology for further eval:   ASSESSMENT AND PLAN: I agree with continuation of the empiric antibiotic therapy. She does not appear to be bacteremic. Given that there is no foreign body at the site of the explant, and there is no signs of abscess, there is no indication for attempted incision and drainage.  She plans on making the trip to [place], with return on Wednesday, and we will see her in follow-up on the following Thursday or Friday to make sure things are improving. Again she has a planned chemotherapy session on Friday October 14th.  She understands and agrees with the plan of care.  Pt has a follow up appt with Interventional Radiology on 03/04/15.

## 2015-02-27 NOTE — Addendum Note (Signed)
Encounter addended by: Ashok Pall, MD on: 02/27/2015 11:01 AM<BR>     Documentation filed: Clinical Notes

## 2015-02-27 NOTE — Assessment & Plan Note (Signed)
Potassium is 3.4 today.  Patient was encouraged to push potassium in her diet is much as possible.  Will continue to monitor.

## 2015-02-27 NOTE — Assessment & Plan Note (Signed)
Patient received her last cycle of Abraxane chemotherapy on 02/15/2015.  Labs obtained reveal WBC 12.1, ANC 3.1, hemoglobin 11.2, and platelet count 123.  Patient is scheduled to return 03/05/2015 for labs, visit, and chemotherapy.

## 2015-02-27 NOTE — Progress Notes (Signed)
SYMPTOM MANAGEMENT CLINIC   HPI: Nicole Valentine 49 y.o. female diagnosed with breast cancer.  Currently undergoing Abraxane chemotherapy regimen.   Pt had her old left upper chest portacath removed; and a new one placed on the same side in August 2016.  She reports development of erythema, edema, warmth, tenderness, and slight drainage from site within last several days. She presented to the ED on Wednesday 02/24/15 for evaluation; and was found to have infection to the site at that time. The portacath drainage was cultured; and pt was placed on both Bactrim and Keflex abx.    Pt presented to the [facility] for further eval of her portacath site. Left upper chest site with continued erythema, warmth, mild tenderness; but no drainage.   Pt was sent to Interventional Radiology for further eval:   HPI  ROS  Past Medical History  Diagnosis Date  . Complication of anesthesia     her father and uncle both had very difficult time waking up-was  something they told them may be hereditory. she will find out.  [name] Hypothyroidism   . 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 Radiation 01/21/13-03/06/13    Right Breast  . Breast cancer ([place]) dx'd 04-10-12-rt    Past Surgical History  Procedure Laterality Date  . Wisdom tooth extraction    . Portacath placement  04/22/2012    Procedure: INSERTION PORT-A-CATH;  Surgeon: [name], MD;  Location: [facility];  Service: General;  Laterality: Left;  [name] Modified mastectomy Right 11/18/2012    Procedure:  RIGHT MODIFIED MASTECTOMY;  Surgeon: [name], MD;  Location: [facility];  Service: General;  Laterality: Right;  . Knee arthroscopy Right 11/26/2013  . Port-a-cath removal Left 12/10/2013    Procedure: MINOR REMOVAL PORT-A-CATH;  Surgeon: [name], MD;  Location: [facility];  Service: General;  Laterality: Left;    has Abdominal wall anomaly; Breast cancer of upper-outer quadrant of  right female breast ([place]); Lymphedema of arm; Knee pain, right; GERD (gastroesophageal reflux disease); History of breast cancer; Mass of neck; Breast cancer metastasized to bone [facility]); Breast cancer metastasized to multiple sites [facility]); Brain metastases ([place]); Acute respiratory failure with hypoxia ([place]); Malignant pleural effusion; SIRS (systemic inflammatory response syndrome) ([place]); Hypersensitivity reaction; Bone metastases ([place]); Breast cancer ([place]); Hyperkalemia; and Central line infection on her problem list.    is allergic to almond meal; almond oil; other; tegaderm ag mesh; and vancomycin.    Medication List       This list is accurate as of: 02/25/15 11:59 PM.  Always use your most recent med list.               cephALEXin 500 MG capsule  Commonly known as:  KEFLEX  Take 1 capsule (500 mg total) by mouth 4 (four) times daily.     cholecalciferol 1000 UNITS tablet  Commonly known as:  VITAMIN D  Take 2,000 Units by mouth 2 (two) times daily.     CLARITIN-D 12 HOUR PO  Take 1 tablet by mouth 2 (two) times daily as needed (allergies).     dexamethasone 4 MG tablet  Commonly known as:  DECADRON  Taper as instructed. 1/2 tablet daily until 9-26, then 1/2 tab QOD until 10/10. Then, stop.     ibuprofen 200 MG tablet  Commonly known as:  ADVIL,MOTRIN  Take 400 mg by mouth every 6 (six) hours as needed for headache or  moderate pain.     levothyroxine 175 MCG tablet  Commonly known as:  SYNTHROID, LEVOTHROID  Take 175 mcg by mouth daily before breakfast.     lidocaine 2 % solution  Commonly known as:  XYLOCAINE  Patient: Mix 1part 2% viscous lidocaine, 1part H20. Swallow 24m of this mixture, 353m before meals and at bedtime, up to QID     loratadine 10 MG tablet  Commonly known as:  CLARITIN  Take 10 mg by mouth daily.     LORazepam 0.5 MG tablet  Commonly known as:  ATIVAN  Take 1 tablet (0.5 mg total) by mouth at bedtime as needed for sleep.     nystatin cream    Commonly known as:  MYCOSTATIN  Apply 1 application topically 2 (two) times daily.     ondansetron 8 MG tablet  Commonly known as:  ZOFRAN  Take 1 tablet (8 mg total) by mouth 2 (two) times daily. Start the day after chemo for 2 days. Then take as needed for nausea or vomiting.     potassium chloride 10 MEQ tablet  Commonly known as:  K-DUR  Take 1 tablet (10 mEq total) by mouth daily.     PRESCRIPTION MEDICATION  CHEMO [facility]     prochlorperazine 10 MG tablet  Commonly known as:  COMPAZINE  Take 1 tablet (10 mg total) by mouth every 6 (six) hours as needed (Nausea or vomiting).     saccharomyces boulardii 250 MG capsule  Commonly known as:  FLORASTOR  Take 1 capsule (250 mg total) by mouth 2 (two) times daily.     sucralfate 1 G tablet  Commonly known as:  CARAFATE  Dissolve 1 tablet in 10 mL H20 and swallow 5 min prior to meals and bedtime.     sulfamethoxazole-trimethoprim 800-160 MG tablet  Commonly known as:  BACTRIM DS,SEPTRA DS  Take 1 tablet by mouth 2 (two) times daily.         PHYSICAL EXAMINATION  Oncology Vitals 02/25/2015 02/25/2015 02/25/2015 02/24/2015 02/15/2015 02/09/2015 02/08/2015  Height 170 cm - - - 170 cm - 170 cm  Weight 78.019 kg - - - 76.522 kg - 78.2 kg  Weight (lbs) 172 lbs - - - 168 lbs 11 oz - 172 lbs 6 oz  BMI (kg/m2) 26.94 kg/m2 - - - 26.42 kg/m2 - 27 kg/m2  Temp 100.4 98.5 98.4 99 98.2 98.3 98.6  Pulse 123 91 94 108 18 98 97  Resp _0 -  SpO2 95 97 95 96 98 99 98  BSA (m2) 1.92 m2 - - - 1.9 m2 - 1.92 m2   BP Readings from Last 3 Encounters:  02/25/15 145/87  02/25/15 111/61  02/15/15 126/72    Physical Exam  Constitutional: She is oriented to person, place, and time.  Alopecia.   HENT:  Head: Normocephalic and atraumatic.  Eyes: Conjunctivae and EOM are normal. Pupils are equal, round, and reactive to light.  Neck: Normal range of motion.  Pulmonary/Chest: Effort normal. No respiratory distress.  Musculoskeletal:  Normal range of motion.  Neurological: She is alert and oriented to person, place, and time. Gait normal.  Skin: Skin is warm and dry. No rash noted. There is erythema. No pallor.  Left upper chest site with continued erythema, warmth, mild tenderness; but no drainage.   Psychiatric: Affect normal.  Nursing note and vitals reviewed.   LABORATORY DATA:. Admission on 02/24/2015, Discharged on 02/25/2015  Component Date Value Ref Range  Status  . WBC 02/25/2015 12.1* 4.0 - 10.5 K/uL Final  . RBC 02/25/2015 3.10* 3.87 - 5.11 MIL/uL Final  . Hemoglobin 02/25/2015 11.2* 12.0 - 15.0 g/dL Final  . HCT 02/25/2015 34.0* 36.0 - 46.0 % Final  . MCV 02/25/2015 109.7* 78.0 - 100.0 fL Final  . MCH 02/25/2015 36.1* 26.0 - 34.0 pg Final  . MCHC 02/25/2015 32.9  30.0 - 36.0 g/dL Final  . RDW 02/25/2015 16.8* 11.5 - 15.5 % Final  . Platelets 02/25/2015 123* 150 - 400 K/uL Final  . Neutrophils Relative % 02/25/2015 90   Final  . Neutro Abs 02/25/2015 10.9* 1.7 - 7.7 K/uL Final  . Lymphocytes Relative 02/25/2015 4   Final  . Lymphs Abs 02/25/2015 0.5* 0.7 - 4.0 K/uL Final  . Monocytes Relative 02/25/2015 6   Final  . Monocytes Absolute 02/25/2015 0.7  0.1 - 1.0 K/uL Final  . Eosinophils Relative 02/25/2015 0   Final  . Eosinophils Absolute 02/25/2015 0.0  0.0 - 0.7 K/uL Final  . Basophils Relative 02/25/2015 0   Final  . Basophils Absolute 02/25/2015 0.0  0.0 - 0.1 K/uL Final  . Sodium 02/25/2015 138  135 - 145 mmol/L Final  . Potassium 02/25/2015 3.4* 3.5 - 5.1 mmol/L Final  . Chloride 02/25/2015 105  101 - 111 mmol/L Final  . CO2 02/25/2015 25  22 - 32 mmol/L Final  . Glucose, Bld 02/25/2015 110* 65 - 99 mg/dL Final  . BUN 02/25/2015 7  6 - 20 mg/dL Final  . Creatinine, Ser 02/25/2015 0.54  0.44 - 1.00 mg/dL Final  . Calcium 02/25/2015 8.3* 8.9 - 10.3 mg/dL Final  . GFR calc non Af Amer 02/25/2015 >60  >60 mL/min Final  . GFR calc Af Amer 02/25/2015 >60  >60 mL/min Final   Comment: (NOTE) The  eGFR has been calculated using the CKD EPI equation. This calculation has not been validated in all clinical situations. eGFR's persistently <60 mL/min signify possible Chronic Kidney Disease.   . Anion gap 02/25/2015 8  5 - 15 Final  . Specimen Description 02/25/2015 BLOOD LEFT HAND   Final  . Special Requests 02/25/2015 BOTTLES DRAWN AEROBIC AND ANAEROBIC 5ML   Final  . Culture 02/25/2015    Final                   Value:NO GROWTH 2 DAYS Performed at [facility]   . Report Status 02/25/2015 PENDING   Incomplete  . Specimen Description 02/25/2015 BLOOD LEFT FOREARM   Final  . Special Requests 02/25/2015 BOTTLES DRAWN AEROBIC AND ANAEROBIC 5ML   Final  . Culture 02/25/2015    Final                   Value:NO GROWTH 2 DAYS Performed at [facility]   . Report Status 02/25/2015 PENDING   Incomplete  . Lactic Acid, Venous 02/25/2015 0.71  0.5 - 2.0 mmol/L Final  . Specimen Description 02/25/2015 CHEST LEFT CHEST PORT   Final  . Special Requests 02/25/2015 Immunocompromised   Final  . Gram Stain 02/25/2015    Final                   Value:NO WBC SEEN NO SQUAMOUS EPITHELIAL CELLS SEEN NO ORGANISMS SEEN Performed at [facility]   . Culture 02/25/2015    Final                   Value:NO GROWTH  2 DAYS Performed at [facility]   . Report Status 02/25/2015 02/27/2015 FINAL   Final     RADIOGRAPHIC STUDIES: Ir Radiologist Eval & Mgmt  02/25/2015   EXAM: ESTABLISHED PATIENT OFFICE VISIT  CHIEF COMPLAINT: My port site is infected.  HISTORY OF PRESENT ILLNESS: Ms Skousen is a 49 year old female with a history of metastatic breast carcinoma, who has been receiving chemotherapy via port catheter. Her first port catheter was placed 11/12/2014 via a left IJ approach. This port catheter became infected, and required removal which occurred 01/08/2015. After a line holiday, she had a second left-sided port catheter placed 02/01/2015.  She reports that she was healing  fine, when suddenly the left chest wall at the site of the explant became inflamed and red with pruritus, and she went to the emergency department 02/24/2015.  The wound was cultured and she was started on empiric antibiotic therapy for cellulitis.  She presents today for evaluation.  She denies fevers, rigors, or chills. Maximum temperature measured before are arrival was 100.5, although she never felt febrile. No flu-like symptoms. She reports that after her last treatment she felt fatigued, which did feel more fatigued than usual after chemotherapy, though this has improved.  She and her family have a planned trip to [place], leaving on Sunday October 9, with return on Wednesday October 12. She has her next planned chemotherapy on Friday October 14th.  She reports that her redness and irritation at the explant site have significantly improved since she was started on antibiotic therapy in the past 24 hours. No drainage or concern for abscess.  PHYSICAL EXAMINATION: Directed physical exam demonstrates erythema at the site of the explant, which she is geographically marginated by indelible ink placed by the emergency department physician on the prior day. The diameter of the inked marking is 10-12 cm, and a diameter of the erythema is less than this margin. No ballotable fluid collection or drainage at the healing incision.  The site of the new incision from the new IJ catheter is inferior at the 8 o'clock position, and beyond the erythema and does not appear to be involved.  ASSESSMENT AND PLAN: I agree with continuation of the empiric antibiotic therapy. She does not appear to be bacteremic. Given that there is no foreign body at the site of the explant, and there is no signs of abscess, there is no indication for attempted incision and drainage.  She plans on making the trip to [place], with return on Wednesday, and we will see her in follow-up on the following Thursday or Friday to make sure things are  improving. Again she has a planned chemotherapy session on Friday October 14th.  She understands and agrees with the plan of care.  Signed,  Dulcy Fanny. Earleen Newport, DO  [facility]  [facility]   Electronically Signed   By: Corrie Mckusick D.O.   On: 02/25/2015 16:45   Left chest:       ASSESSMENT/PLAN:    Breast cancer of upper-outer quadrant of right female breast Patient received her last cycle of Abraxane chemotherapy on 02/15/2015.  Labs obtained reveal WBC 12.1, ANC 3.1, hemoglobin 11.2, and platelet count 123.  Patient is scheduled to return 03/05/2015 for labs, visit, and chemotherapy.  Hyperkalemia Potassium is 3.4 today.  Patient was encouraged to push potassium in her diet is much as possible.  Will continue to monitor.  Central line infection Pt had her old left upper chest portacath  removed; and a new one placed on the same side in August 2016.  She reports development of erythema, edema, warmth, tenderness, and slight drainage from site within last several days. She presented to the ED on Wednesday 02/24/15 for evaluation; and was found to have infection to the site at that time. The portacath drainage was cultured; and pt was placed on both Bactrim and Keflex abx.    Pt presented to the [facility] for further eval of her portacath site. Left upper chest site with continued erythema, warmth, mild tenderness; but no drainage.   Pt was sent to Interventional Radiology for further eval:   ASSESSMENT AND PLAN: I agree with continuation of the empiric antibiotic therapy. She does not appear to be bacteremic. Given that there is no foreign body at the site of the explant, and there is no signs of abscess, there is no indication for attempted incision and drainage.  She plans on making the trip to [place], with return on Wednesday, and we will see her in follow-up on the following Thursday or Friday to make sure things are  improving. Again she has a planned chemotherapy session on Friday October 14th.  She understands and agrees with the plan of care.  Pt has a follow up appt with Interventional Radiology on 03/04/15.    Patient stated understanding of all instructions; and was in agreement with this plan of care. The patient knows to call the clinic with any problems, questions or concerns.   Review/collaboration with Dr. Burr Medico regarding all aspects of patient's visit today.   Total time spent with patient was 25 minutes;  with greater than 75 percent of that time spent in face to face counseling regarding patient's symptoms,  and coordination of care and follow up.  Disclaimer:This dictation was prepared with Dragon/digital dictation along with [employer]. Any transcriptional errors that result from this process are unintentional.  Drue Second, NP 02/27/2015

## 2015-03-01 ENCOUNTER — Other Ambulatory Visit (HOSPITAL_COMMUNITY): Payer: Self-pay | Admitting: Interventional Radiology

## 2015-03-01 ENCOUNTER — Ambulatory Visit (HOSPITAL_COMMUNITY)
Admission: RE | Admit: 2015-03-01 | Discharge: 2015-03-01 | Disposition: A | Discharge status: Home / Self Care | Payer: BLUE CROSS/BLUE SHIELD | Source: Ambulatory Visit | Attending: Interventional Radiology | Admitting: Interventional Radiology

## 2015-03-01 ENCOUNTER — Ambulatory Visit: Payer: BLUE CROSS/BLUE SHIELD | Admitting: Nurse Practitioner

## 2015-03-01 ENCOUNTER — Telehealth: Payer: Self-pay | Admitting: *Deleted

## 2015-03-01 ENCOUNTER — Other Ambulatory Visit: Payer: BLUE CROSS/BLUE SHIELD

## 2015-03-01 ENCOUNTER — Ambulatory Visit: Payer: BLUE CROSS/BLUE SHIELD

## 2015-03-01 DIAGNOSIS — C50911 Malignant neoplasm of unspecified site of right female breast: Secondary | ICD-10-CM

## 2015-03-01 NOTE — Consult Note (Signed)
Chief Complaint: Patient was seen in consultation today for assessment of port pocket and incision at the request of [place]  Referring Physician(s): Wagner,Jaime  History of Present Illness: Nicole Valentine is a 49 y.o. female known to our service from port catheter placement on 11/12/2014. There is concern of a port pocket infection, requiring port removal and 01/08/2015 with primary closure of the incision site. Ultimately a new left IJ port catheter was placed 02/01/2015. Because of right mastectomy and right arm lymphedema, a right-sided approach was not indicated. 5 days ago she had some drainage at the incision site of the initial port pocket, seen and started on empiric antibiotics. The site was cultured. On Saturday, the incision opened up even more with a small amount of drainage and she presents for evaluation. No fevers. No worsening tenderness.  Past Medical History  Diagnosis Date  . Complication of anesthesia     her father and uncle both had very difficult time waking up-was  something they told them may be hereditory. she will find out.  [name] Hypothyroidism   . 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 Radiation 01/21/13-03/06/13    Right Breast  . Breast cancer ([place]) dx'd 04-10-12-rt    Past Surgical History  Procedure Laterality Date  . Wisdom tooth extraction    . Portacath placement  04/22/2012    Procedure: INSERTION PORT-A-CATH;  Surgeon: [name], MD;  Location: [place];  Service: General;  Laterality: Left;  [name] Modified mastectomy Right 11/18/2012    Procedure:  RIGHT MODIFIED MASTECTOMY;  Surgeon: [name], MD;  Location: [place];  Service: General;  Laterality: Right;  . Knee arthroscopy Right 11/26/2013  . Port-a-cath removal Left 12/10/2013    Procedure: MINOR REMOVAL PORT-A-CATH;  Surgeon: [name], MD;  Location: [facility];  Service: General;  Laterality: Left;    Allergies: Almond  meal; Almond oil; Other; Tegaderm ag mesh; and Vancomycin  Medications: Prior to Admission medications   Medication Sig Start Date End Date Taking? Authorizing Provider  cephALEXin (KEFLEX) 500 MG capsule Take 1 capsule (500 mg total) by mouth 4 (four) times daily. 02/25/15   Varney Biles, MD  cholecalciferol (VITAMIN D) 1000 UNITS tablet Take 2,000 Units by mouth 2 (two) times daily.     Historical Provider, MD  dexamethasone (DECADRON) 4 MG tablet Taper as instructed. 1/2 tablet daily until 9-26, then 1/2 tab QOD until 10/10. Then, stop. 02/08/15   Eppie Gibson, MD  ibuprofen (ADVIL,MOTRIN) 200 MG tablet Take 400 mg by mouth every 6 (six) hours as needed for headache or moderate pain.    Historical Provider, MD  levothyroxine (SYNTHROID, LEVOTHROID) 175 MCG tablet Take 175 mcg by mouth daily before breakfast.    Historical Provider, MD  lidocaine (XYLOCAINE) 2 % solution Patient: Mix 1part 2% viscous lidocaine, 1part H20. Swallow 82mL of this mixture, 23min before meals and at bedtime, up to QID Patient not taking: Reported on 02/15/2015 02/08/15   Eppie Gibson, MD  loratadine (CLARITIN) 10 MG tablet Take 10 mg by mouth daily.    Historical Provider, MD  Loratadine-Pseudoephedrine (CLARITIN-D 12 HOUR PO) Take 1 tablet by mouth 2 (two) times daily as needed (allergies).    Historical Provider, MD  LORazepam (ATIVAN) 0.5 MG tablet Take 1 tablet (0.5 mg total) by mouth at bedtime as needed for sleep. 02/15/15   Laurie Panda, NP  nystatin cream (MYCOSTATIN) Apply 1 application topically  2 (two) times daily. Patient not taking: Reported on 01/18/2015 01/11/15   Laurie Panda, NP  ondansetron (ZOFRAN) 8 MG tablet Take 1 tablet (8 mg total) by mouth 2 (two) times daily. Start the day after chemo for 2 days. Then take as needed for nausea or vomiting. Patient not taking: Reported on 01/11/2015 11/06/14   Chauncey Cruel, MD  potassium chloride (K-DUR) 10 MEQ tablet Take 1 tablet (10 mEq total) by  mouth daily. 02/15/15   Chauncey Cruel, MD  [place]    Historical Provider, MD  prochlorperazine (COMPAZINE) 10 MG tablet Take 1 tablet (10 mg total) by mouth every 6 (six) hours as needed (Nausea or vomiting). Patient not taking: Reported on 01/11/2015 11/06/14   Chauncey Cruel, MD  saccharomyces boulardii (FLORASTOR) 250 MG capsule Take 1 capsule (250 mg total) by mouth 2 (two) times daily. 11/19/14   [name], MD  sucralfate (CARAFATE) 1 G tablet Dissolve 1 tablet in 10 mL H20 and swallow 5 min prior to meals and bedtime. Patient not taking: Reported on 02/25/2015 02/08/15   Eppie Gibson, MD  sulfamethoxazole-trimethoprim (BACTRIM DS,SEPTRA DS) 800-160 MG tablet Take 1 tablet by mouth 2 (two) times daily. 02/25/15 03/04/15  Varney Biles, MD     Family History  Problem Relation Age of Onset  . Lung cancer Maternal Grandfather 32    smoker  . Prostate cancer Maternal Grandfather 90  . Throat cancer Other     Great Aunt x 2  . Liver cancer Other     Maternal Great Grandmother  . Melanoma Maternal Uncle 81  . Brain cancer Cousin 11    non-malignant    Social History   Social History  . Marital Status: Married    Spouse Name: N/A  . Number of Children: N/A  . Years of Education: N/A   Social History Main Topics  . Smoking status: Never Smoker   . Smokeless tobacco: Never Used  . Alcohol Use: No  . Drug Use: No  . Sexual Activity: Yes   Other Topics Concern  . Not on file   Social History Narrative    ECOG Status: 1 - Symptomatic but completely ambulatory  Review of Systems: A 12 point ROS discussed and pertinent positives are indicated in the HPI above.  All other systems are negative.  Review of Systems  Vital Signs: LMP 03/29/2012  Physical Exam The left upper chest wall erythema has resolved. She has mild tenderness at the site and just below the side of the abandoned port pocket. There is mild fluctuance at the site of port  removal. Mild tenderness. A few drops of serosanguineous fluid are expressed from the incision above the site with gentle palpation. Under sterile conditions, gentle blunt probing of the incision does not demonstrate a  communication to the port pocket cavity. The intact port pocket is nontender without fluctuance.    Imaging: Ir Fluoro Guide Cv Line Right  02/01/2015   CLINICAL DATA:  Metastatic breast cancer  EXAM: LEFT INTERNAL JUGULAR SINGLE LUMEN POWER PORT CATHETER INSERTION  Date:  9/12/20169/04/2015 4:08 pm  Radiologist:  M. Daryll Brod, MD  Guidance:  Ultrasound and fluoroscopic  FLUOROSCOPY TIME:  1 minutes 43 seconds, 10 mGy  MEDICATIONS AND MEDICAL HISTORY: 2 g Ancefadministered within 1 hour of the procedure.4 mg Versed, 75 mcg fentanyl  ANESTHESIA/SEDATION: 30 minutes  CONTRAST:  None.  COMPLICATIONS: None immediate  PROCEDURE: Informed consent was obtained from the patient following  explanation of the procedure, risks, benefits and alternatives. The patient understands, agrees and consents for the procedure. All questions were addressed. A time out was performed.  Maximal barrier sterile technique utilized including caps, mask, sterile gowns, sterile gloves, large sterile drape, hand hygiene, and 2% chlorhexidine scrub.  Under sterile conditions and local anesthesia, right internal jugular micropuncture venous access was performed. Access was performed with ultrasound. Images were obtained for documentation. A guide wire was inserted followed by a transitional dilator. This allowed insertion of a guide wire and catheter into the IVC. Measurements were obtained from the SVC / RA junction back to the right IJ venotomy site. In the right infraclavicular chest, a subcutaneous pocket was created over the second anterior rib. This was done under sterile conditions and local anesthesia. 1% lidocaine with epinephrine was utilized for this. A 2.5 cm incision was made in the skin. Blunt dissection was  performed to create a subcutaneous pocket over the right pectoralis major muscle. The pocket was flushed with saline vigorously. There was adequate hemostasis. The port catheter was assembled and checked for leakage. The port catheter was secured in the pocket with two retention sutures. The tubing was tunneled subcutaneously to the right venotomy site and inserted into the SVC/RA junction through a valved peel-away sheath. Position was confirmed with fluoroscopy. Images were obtained for documentation. The patient tolerated the procedure well. No immediate complications. Incisions were closed in a two layer fashion with 4 - 0 Vicryl suture. Dermabond was applied to the skin. The port catheter was accessed, blood was aspirated followed by saline and heparin flushes. Needle was removed. A dry sterile dressing was applied.  IMPRESSION: Ultrasound and fluoroscopically guided right internal jugular single lumen power port catheter insertion. Tip in the SVC/RA junction. Catheter ready for use.   Electronically Signed   By: Jerilynn Mages.  Shick M.D.   On: 02/01/2015 16:28   Ir US Guide Vasc Access Right  02/01/2015   CLINICAL DATA:  Metastatic breast cancer  EXAM: LEFT INTERNAL JUGULAR SINGLE LUMEN POWER PORT CATHETER INSERTION  Date:  9/12/20169/04/2015 4:08 pm  Radiologist:  M. Daryll Brod, MD  Guidance:  Ultrasound and fluoroscopic  FLUOROSCOPY TIME:  1 minutes 43 seconds, 10 mGy  MEDICATIONS AND MEDICAL HISTORY: 2 g Ancefadministered within 1 hour of the procedure.4 mg Versed, 75 mcg fentanyl  ANESTHESIA/SEDATION: 30 minutes  CONTRAST:  None.  COMPLICATIONS: None immediate  PROCEDURE: Informed consent was obtained from the patient following explanation of the procedure, risks, benefits and alternatives. The patient understands, agrees and consents for the procedure. All questions were addressed. A time out was performed.  Maximal barrier sterile technique utilized including caps, mask, sterile gowns, sterile gloves, large  sterile drape, hand hygiene, and 2% chlorhexidine scrub.  Under sterile conditions and local anesthesia, right internal jugular micropuncture venous access was performed. Access was performed with ultrasound. Images were obtained for documentation. A guide wire was inserted followed by a transitional dilator. This allowed insertion of a guide wire and catheter into the IVC. Measurements were obtained from the SVC / RA junction back to the right IJ venotomy site. In the right infraclavicular chest, a subcutaneous pocket was created over the second anterior rib. This was done under sterile conditions and local anesthesia. 1% lidocaine with epinephrine was utilized for this. A 2.5 cm incision was made in the skin. Blunt dissection was performed to create a subcutaneous pocket over the right pectoralis major muscle. The pocket was flushed with saline vigorously. There was adequate  hemostasis. The port catheter was assembled and checked for leakage. The port catheter was secured in the pocket with two retention sutures. The tubing was tunneled subcutaneously to the right venotomy site and inserted into the SVC/RA junction through a valved peel-away sheath. Position was confirmed with fluoroscopy. Images were obtained for documentation. The patient tolerated the procedure well. No immediate complications. Incisions were closed in a two layer fashion with 4 - 0 Vicryl suture. Dermabond was applied to the skin. The port catheter was accessed, blood was aspirated followed by saline and heparin flushes. Needle was removed. A dry sterile dressing was applied.  IMPRESSION: Ultrasound and fluoroscopically guided right internal jugular single lumen power port catheter insertion. Tip in the SVC/RA junction. Catheter ready for use.   Electronically Signed   By: Jerilynn Mages.  Shick M.D.   On: 02/01/2015 16:28   Ir Radiologist Eval & Mgmt  02/25/2015   EXAM: ESTABLISHED PATIENT OFFICE VISIT  CHIEF COMPLAINT: My port site is infected.   HISTORY OF PRESENT ILLNESS: Ms Watanabe is a 49 year old female with a history of metastatic breast carcinoma, who has been receiving chemotherapy via port catheter. Her first port catheter was placed 11/12/2014 via a left IJ approach. This port catheter became infected, and required removal which occurred 01/08/2015. After a line holiday, she had a second left-sided port catheter placed 02/01/2015.  She reports that she was healing fine, when suddenly the left chest wall at the site of the explant became inflamed and red with pruritus, and she went to the emergency department 02/24/2015.  The wound was cultured and she was started on empiric antibiotic therapy for cellulitis.  She presents today for evaluation.  She denies fevers, rigors, or chills. Maximum temperature measured before are arrival was 100.5, although she never felt febrile. No flu-like symptoms. She reports that after her last treatment she felt fatigued, which did feel more fatigued than usual after chemotherapy, though this has improved.  She and her family have a planned trip to [place], leaving on Sunday October 9, with return on Wednesday October 12. She has her next planned chemotherapy on Friday October 14th.  She reports that her redness and irritation at the explant site have significantly improved since she was started on antibiotic therapy in the past 24 hours. No drainage or concern for abscess.  PHYSICAL EXAMINATION: Directed physical exam demonstrates erythema at the site of the explant, which she is geographically marginated by indelible ink placed by the emergency department physician on the prior day. The diameter of the inked marking is 10-12 cm, and a diameter of the erythema is less than this margin. No ballotable fluid collection or drainage at the healing incision.  The site of the new incision from the new IJ catheter is inferior at the 8 o'clock position, and beyond the erythema and does not appear to be involved.   ASSESSMENT AND PLAN: I agree with continuation of the empiric antibiotic therapy. She does not appear to be bacteremic. Given that there is no foreign body at the site of the explant, and there is no signs of abscess, there is no indication for attempted incision and drainage.  She plans on making the trip to [place], with return on Wednesday, and we will see her in follow-up on the following Thursday or Friday to make sure things are improving. Again she has a planned chemotherapy session on Friday October 14th.  She understands and agrees with the plan of care.  Signed,  Dulcy Fanny.  Earleen Newport, DO  [facility]  [facility]   Electronically Signed   By: Corrie Mckusick D.O.   On: 02/25/2015 16:45    Labs:  CBC:  Recent Labs  02/01/15 1255 02/08/15 1024 02/15/15 1003 02/25/15 0203  WBC 14.6* 9.8 4.4 12.1*  HGB 12.7 13.5 12.1 11.2*  HCT 38.4 39.9 36.6 34.0*  PLT 151 148 168 123*    COAGS:  Recent Labs  11/06/14 1118 11/12/14 1310 01/08/15 1020 02/01/15 1255  INR 1.10 1.10 1.10 1.11  APTT 32 30 30  --     BMP:  Recent Labs  11/18/14 0550 11/19/14 0510  01/08/15 1020  01/26/15 1254 02/08/15 1024 02/15/15 1003 02/25/15 0203  NA 139 138  < > 137  < > 142 140 142 138  K 3.6 3.2*  < > 3.9  < > 3.4* 3.5 3.2* 3.4*  CL 103 104  --  107  --   --   --   --  105  CO2 28 27  < > 23  < > 27 30* 29 25  GLUCOSE 105* 104*  < > 119*  < > 94 95 115 110*  BUN 13 7  < > 13  < > 13.3 13.1 14.3 7  CALCIUM 7.7* 7.4*  < > 9.0  < > 9.1 8.4 8.8 8.3*  CREATININE 0.58 0.59  < > 0.69  < > 0.7 0.7 0.6 0.54  GFRNONAA >60 >60  --  >60  --   --   --   --  >60  GFRAA >60 >60  --  >60  --   --   --   --  >60  < > = values in this interval not displayed.  LIVER FUNCTION TESTS:  Recent Labs  01/18/15 1140 01/26/15 1254 02/08/15 1024 02/15/15 1003  BILITOT 0.61 0.54 0.66 0.93  AST 12 13 10 13   ALT 13 13 16 15   ALKPHOS 75 60 96 68  PROT 6.6 6.3* 5.7*  5.7*  ALBUMIN 3.7 3.6 3.2* 3.2*    TUMOR MARKERS: No results for input(s): AFPTM, CEA, CA199, CHROMGRNA in the last 8760 hours.  Assessment and Plan: my impression is that she had some residual fluid in the cavity from previous port removal, which likely became contaminated, and has now resulted in communication to the initial incision site. This needs to heal by secondary intention. As the port cavity is spontaneously decompressing through the wound, I don't feel like we need to cause further trauma with additional dissection of the site. She does seem to be responding to her current antibiotics course, which we will continue.  I dressed the wound with iodoform gauze and sterile gauze. We'll see her back in 4 days for inspection and dressing change. I discussed that this will take several weeks to heal. In the meantime, as the new port pocket is out of this field, it's okay to use. She knows to contact us should she have worsening pain, redness, significant drainage, fever, chills, or other questions.  Thank you for this interesting consult.  I greatly enjoyed meeting Sheronica Corey and look forward to participating in their care.  A copy of this report was sent to the requesting provider on this date.  Signed: Daran Favaro III, DAYNE Bibiana Gillean 03/01/2015, 11:50 AM   I spent a total of    25 Minutes  in face to face in clinical consultation, greater than 50% of which was counseling/coordinating care for port pocket  infection

## 2015-03-01 NOTE — Telephone Encounter (Signed)
Patient was seen by Arne Cleveland, MD at 03/01/2015 11:50 AM

## 2015-03-01 NOTE — Telephone Encounter (Signed)
FYI Dr. [name],  Voicemail from patient c/o "left site of port-a-cath incision has opened 1" x 1/4" and is deep.  It looks clean and isn't infected but I want to be seen today.  I've had pain to my back since Thursday and my left arm feels like it's lacking circulation or numb with a heavy pain in it.  I take tylenol but pain returns when tylenol wears off.  I wonder if I have fluid building up in my lungs." Called patient instructing to come in to [facility] but she asked if she'll see IR.  This nurse called IR, spoke with Tiffany about patient symptoms who notified radiologost.  Instructions per Jonelle Sidle to have [facility] come in at this time.  Miryam can be there within 45 minutes.   Currently on antibiotics.

## 2015-03-02 ENCOUNTER — Other Ambulatory Visit: Payer: Self-pay

## 2015-03-02 ENCOUNTER — Encounter: Payer: Self-pay | Admitting: Nurse Practitioner

## 2015-03-02 ENCOUNTER — Ambulatory Visit (HOSPITAL_BASED_OUTPATIENT_CLINIC_OR_DEPARTMENT_OTHER): Payer: BLUE CROSS/BLUE SHIELD | Admitting: Nurse Practitioner

## 2015-03-02 ENCOUNTER — Ambulatory Visit (HOSPITAL_COMMUNITY)
Admission: RE | Admit: 2015-03-02 | Discharge: 2015-03-02 | Disposition: A | Discharge status: Home / Self Care | Payer: BLUE CROSS/BLUE SHIELD | Source: Ambulatory Visit | Attending: Nurse Practitioner | Admitting: Nurse Practitioner

## 2015-03-02 ENCOUNTER — Telehealth: Payer: Self-pay

## 2015-03-02 ENCOUNTER — Other Ambulatory Visit: Payer: Self-pay | Admitting: Oncology

## 2015-03-02 VITALS — BP 134/69 | HR 108 | Temp 98.2°F | Resp 20 | Ht 67.0 in | Wt 165.5 lb

## 2015-03-02 DIAGNOSIS — C7931 Secondary malignant neoplasm of brain: Secondary | ICD-10-CM | POA: Diagnosis not present

## 2015-03-02 DIAGNOSIS — C7971 Secondary malignant neoplasm of right adrenal gland: Secondary | ICD-10-CM

## 2015-03-02 DIAGNOSIS — T80219A Unspecified infection due to central venous catheter, initial encounter: Secondary | ICD-10-CM | POA: Diagnosis not present

## 2015-03-02 DIAGNOSIS — J910 Malignant pleural effusion: Secondary | ICD-10-CM

## 2015-03-02 DIAGNOSIS — C50911 Malignant neoplasm of unspecified site of right female breast: Secondary | ICD-10-CM

## 2015-03-02 DIAGNOSIS — C7951 Secondary malignant neoplasm of bone: Secondary | ICD-10-CM | POA: Diagnosis not present

## 2015-03-02 DIAGNOSIS — C787 Secondary malignant neoplasm of liver and intrahepatic bile duct: Secondary | ICD-10-CM

## 2015-03-02 DIAGNOSIS — C50411 Malignant neoplasm of upper-outer quadrant of right female breast: Secondary | ICD-10-CM | POA: Diagnosis not present

## 2015-03-02 DIAGNOSIS — C7801 Secondary malignant neoplasm of right lung: Secondary | ICD-10-CM

## 2015-03-02 DIAGNOSIS — C778 Secondary and unspecified malignant neoplasm of lymph nodes of multiple regions: Secondary | ICD-10-CM

## 2015-03-02 DIAGNOSIS — T80219D Unspecified infection due to central venous catheter, subsequent encounter: Secondary | ICD-10-CM

## 2015-03-02 DIAGNOSIS — C7802 Secondary malignant neoplasm of left lung: Secondary | ICD-10-CM

## 2015-03-02 DIAGNOSIS — G893 Neoplasm related pain (acute) (chronic): Secondary | ICD-10-CM

## 2015-03-02 LAB — CULTURE, BLOOD (ROUTINE X 2)
CULTURE: NO GROWTH
Culture: NO GROWTH

## 2015-03-02 NOTE — Assessment & Plan Note (Signed)
Patient received her last Abraxane chemotherapy on 02/15/2015.  She has been dealing with a left upper chest infection at the site of her old Port-A-Cath insertion.  Interventional radiology has been providing evaluation and management of the infection site; the patient continues to take both Keflex and Bactrim antibiotics.  Patient also has a history of pleural effusions in the past; has undergone 2 different thoracentesis in the past.  She is now complaining of some left upper back discomfort and increasing left upper extremity numbness and tingling.  She denies any known injury or trauma to her arms or back.  She also denies any recent fevers or chills.  Patient will undergo a chest CT with contrast for further evaluation tomorrow 03/03/2015 at 3 PM.  Patient is scheduled for a recheck of her previous Port-A-Cath infection site per interventional radiology on 03/05/2015.  She is also scheduled for labs, visit, and her next cycle of chemotherapy on 03/05/2015 as well.

## 2015-03-02 NOTE — Progress Notes (Signed)
Patient called this morning c/o back pain between her shoulder blades.  She states that she is unable to to take a "full" breath and is concerned she may be building up fluid in her right lung which has happened in the past.  She rates her pain 4-5/10 in her "thoracic spine" which she is presently taking tylenol for the pain.  Patient states she has had radiation to the area. Patient to see Selena Lesser, NP today with a chest xray prior to her visit.

## 2015-03-02 NOTE — Progress Notes (Signed)
SYMPTOM MANAGEMENT CLINIC   HPI: Nicole Valentine 49 y.o. female diagnosed with breast cancer.  Currently undergoing Abraxane chemotherapy regimen.  Patient received her last Abraxane chemotherapy on 02/15/2015.  She has been dealing with a left upper chest infection at the site of her old Port-A-Cath insertion.  Interventional radiology has been providing evaluation and management of the infection site; the patient continues to take both Keflex and Bactrim antibiotics.  Patient also has a history of pleural effusions in the past; has undergone 2 different thoracentesis in the past.  She is now complaining of some left upper back discomfort and increasing left upper extremity numbness and tingling.  She denies any known injury or trauma to her arms or back.  She also denies any recent fevers or chills.  HPI  ROS  Past Medical History  Diagnosis Date  . Complication of anesthesia     her father and uncle both had very difficult time waking up-was  something they told them may be hereditory. she will find out.  [name] Hypothyroidism   . 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 Radiation 01/21/13-03/06/13    Right Breast  . Breast cancer ([place]) dx'd 04-10-12-rt    Past Surgical History  Procedure Laterality Date  . Wisdom tooth extraction    . Portacath placement  04/22/2012    Procedure: INSERTION PORT-A-CATH;  Surgeon: [name], MD;  Location: [facility];  Service: General;  Laterality: Left;  [name] Modified mastectomy Right 11/18/2012    Procedure:  RIGHT MODIFIED MASTECTOMY;  Surgeon: [name], MD;  Location: [facility];  Service: General;  Laterality: Right;  . Knee arthroscopy Right 11/26/2013  . Port-a-cath removal Left 12/10/2013    Procedure: MINOR REMOVAL PORT-A-CATH;  Surgeon: [name], MD;  Location: [facility];  Service: General;  Laterality: Left;    has Abdominal wall anomaly; Breast cancer of upper-outer quadrant  of right female breast ([place]); Lymphedema of arm; Knee pain, right; GERD (gastroesophageal reflux disease); History of breast cancer; Mass of neck; Breast cancer metastasized to bone [facility]); Breast cancer metastasized to multiple sites [facility]); Brain metastases ([place]); Acute respiratory failure with hypoxia ([place]); Malignant pleural effusion; SIRS (systemic inflammatory response syndrome) ([place]); Hypersensitivity reaction; Bone metastases ([place]); Breast cancer ([place]); Hyperkalemia; Central line infection; and Cancer associated pain on her problem list.    is allergic to almond meal; almond oil; other; tegaderm ag mesh; and vancomycin.    Medication List       This list is accurate as of: 03/02/15  1:50 PM.  Always use your most recent med list.               cephALEXin 500 MG capsule  Commonly known as:  KEFLEX  Take 1 capsule (500 mg total) by mouth 4 (four) times daily.     cholecalciferol 1000 UNITS tablet  Commonly known as:  VITAMIN D  Take 2,000 Units by mouth 2 (two) times daily.     CLARITIN-D 12 HOUR PO  Take 1 tablet by mouth 2 (two) times daily as needed (allergies).     dexamethasone 4 MG tablet  Commonly known as:  DECADRON  Taper as instructed. 1/2 tablet daily until 9-26, then 1/2 tab QOD until 10/10. Then, stop.     ibuprofen 200 MG tablet  Commonly known as:  ADVIL,MOTRIN  Take 400 mg by mouth every 6 (six) hours as needed for headache or moderate pain.  levothyroxine 175 MCG tablet  Commonly known as:  SYNTHROID, LEVOTHROID  Take 175 mcg by mouth daily before breakfast.     lidocaine 2 % solution  Commonly known as:  XYLOCAINE  Patient: Mix 1part 2% viscous lidocaine, 1part H20. Swallow 22m of this mixture, 323m before meals and at bedtime, up to QID     loratadine 10 MG tablet  Commonly known as:  CLARITIN  Take 10 mg by mouth daily.     LORazepam 0.5 MG tablet  Commonly known as:  ATIVAN  Take 1 tablet (0.5 mg total) by mouth at bedtime as needed for  sleep.     nystatin cream  Commonly known as:  MYCOSTATIN  Apply 1 application topically 2 (two) times daily.     ondansetron 8 MG tablet  Commonly known as:  ZOFRAN  Take 1 tablet (8 mg total) by mouth 2 (two) times daily. Start the day after chemo for 2 days. Then take as needed for nausea or vomiting.     potassium chloride 10 MEQ tablet  Commonly known as:  K-DUR  Take 1 tablet (10 mEq total) by mouth daily.     PRESCRIPTION MEDICATION  CHEMO [facility]     prochlorperazine 10 MG tablet  Commonly known as:  COMPAZINE  Take 1 tablet (10 mg total) by mouth every 6 (six) hours as needed (Nausea or vomiting).     saccharomyces boulardii 250 MG capsule  Commonly known as:  FLORASTOR  Take 1 capsule (250 mg total) by mouth 2 (two) times daily.     sucralfate 1 G tablet  Commonly known as:  CARAFATE  Dissolve 1 tablet in 10 mL H20 and swallow 5 min prior to meals and bedtime.     sulfamethoxazole-trimethoprim 800-160 MG tablet  Commonly known as:  BACTRIM DS,SEPTRA DS  Take 1 tablet by mouth 2 (two) times daily.         PHYSICAL EXAMINATION  Oncology Vitals 03/02/2015 02/25/2015 02/25/2015 02/25/2015 02/24/2015 02/15/2015 02/09/2015  Height 170 cm 170 cm - - - 170 cm -  Weight 75.07 kg 78.019 kg - - - 76.522 kg -  Weight (lbs) 165 lbs 8 oz 172 lbs - - - 168 lbs 11 oz -  BMI (kg/m2) 25.92 kg/m2 26.94 kg/m2 - - - 26.42 kg/m2 -  Temp 98.2 100.4 98.5 98.4 99 98.2 98.3  Pulse 108 123 91 94 108 18 98  Resp '20 18 20 18 18 18 18  ' SpO2 96 95 97 95 96 98 99  BSA (m2) 1.88 m2 1.92 m2 - - - 1.9 m2 -   BP Readings from Last 3 Encounters:  03/02/15 134/69  02/25/15 145/87  02/25/15 111/61    Physical Exam  Constitutional: She is oriented to person, place, and time and well-developed, well-nourished, and in no distress.  Alopecia.   HENT:  Head: Normocephalic and atraumatic.  Eyes: Conjunctivae and EOM are normal. Pupils are equal, round, and reactive to light. Right eye exhibits no  discharge. Left eye exhibits no discharge. No scleral icterus.  Neck: Normal range of motion. Neck supple. No JVD present. No tracheal deviation present. No thyromegaly present.  Cardiovascular: Normal rate, regular rhythm, normal heart sounds and intact distal pulses.   Pulmonary/Chest: Effort normal and breath sounds normal. No respiratory distress. She has no wheezes. She has no rales. She exhibits no tenderness.  Abdominal: Soft. Bowel sounds are normal. She exhibits no distension and no mass. There is no tenderness. There is no  rebound and no guarding.  Musculoskeletal: Normal range of motion. She exhibits tenderness. She exhibits no edema.  Patient complains of chronic continual pain to her left upper back region; with no increase in pain with palpation or movement.  On exam.-There is no obvious injury or trauma to the site.  Also, there is no obvious rash.  Lymphadenopathy:    She has no cervical adenopathy.  Neurological: She is alert and oriented to person, place, and time. Gait normal.  Skin: Skin is warm and dry. No rash noted. There is erythema. No pallor.  Left upper chest.  Old Port-A-Cath infection site with dressing intact.  It does appear that there is less erythema to the site.  Also, patient has a tiny healing lesion to her left upper back that appears to be a bug bite.  There is no other obvious rash.  Psychiatric: Affect normal.  Nursing note and vitals reviewed.   LABORATORY DATA:. No visits with results within 3 Day(s) from this visit. Latest known visit with results is:  Admission on 02/24/2015, Discharged on 02/25/2015  Component Date Value Ref Range Status  . WBC 02/25/2015 12.1* 4.0 - 10.5 K/uL Final  . RBC 02/25/2015 3.10* 3.87 - 5.11 MIL/uL Final  . Hemoglobin 02/25/2015 11.2* 12.0 - 15.0 g/dL Final  . HCT 02/25/2015 34.0* 36.0 - 46.0 % Final  . MCV 02/25/2015 109.7* 78.0 - 100.0 fL Final  . MCH 02/25/2015 36.1* 26.0 - 34.0 pg Final  . MCHC 02/25/2015 32.9   30.0 - 36.0 g/dL Final  . RDW 02/25/2015 16.8* 11.5 - 15.5 % Final  . Platelets 02/25/2015 123* 150 - 400 K/uL Final  . Neutrophils Relative % 02/25/2015 90   Final  . Neutro Abs 02/25/2015 10.9* 1.7 - 7.7 K/uL Final  . Lymphocytes Relative 02/25/2015 4   Final  . Lymphs Abs 02/25/2015 0.5* 0.7 - 4.0 K/uL Final  . Monocytes Relative 02/25/2015 6   Final  . Monocytes Absolute 02/25/2015 0.7  0.1 - 1.0 K/uL Final  . Eosinophils Relative 02/25/2015 0   Final  . Eosinophils Absolute 02/25/2015 0.0  0.0 - 0.7 K/uL Final  . Basophils Relative 02/25/2015 0   Final  . Basophils Absolute 02/25/2015 0.0  0.0 - 0.1 K/uL Final  . Sodium 02/25/2015 138  135 - 145 mmol/L Final  . Potassium 02/25/2015 3.4* 3.5 - 5.1 mmol/L Final  . Chloride 02/25/2015 105  101 - 111 mmol/L Final  . CO2 02/25/2015 25  22 - 32 mmol/L Final  . Glucose, Bld 02/25/2015 110* 65 - 99 mg/dL Final  . BUN 02/25/2015 7  6 - 20 mg/dL Final  . Creatinine, Ser 02/25/2015 0.54  0.44 - 1.00 mg/dL Final  . Calcium 02/25/2015 8.3* 8.9 - 10.3 mg/dL Final  . GFR calc non Af Amer 02/25/2015 >60  >60 mL/min Final  . GFR calc Af Amer 02/25/2015 >60  >60 mL/min Final   Comment: (NOTE) The eGFR has been calculated using the CKD EPI equation. This calculation has not been validated in all clinical situations. eGFR's persistently <60 mL/min signify possible Chronic Kidney Disease.   . Anion gap 02/25/2015 8  5 - 15 Final  . Specimen Description 02/25/2015 BLOOD LEFT HAND   Final  . Special Requests 02/25/2015 BOTTLES DRAWN AEROBIC AND ANAEROBIC 5ML   Final  . Culture 02/25/2015    Final                   Value:NO GROWTH 5 DAYS  Performed at [facility]   . Report Status 02/25/2015 03/02/2015 FINAL   Final  . Specimen Description 02/25/2015 BLOOD LEFT FOREARM   Final  . Special Requests 02/25/2015 BOTTLES DRAWN AEROBIC AND ANAEROBIC 5ML   Final  . Culture 02/25/2015    Final                   Value:NO GROWTH 5 DAYS Performed at  [facility]   . Report Status 02/25/2015 03/02/2015 FINAL   Final  . Lactic Acid, Venous 02/25/2015 0.71  0.5 - 2.0 mmol/L Final  . Specimen Description 02/25/2015 CHEST LEFT CHEST PORT   Final  . Special Requests 02/25/2015 Immunocompromised   Final  . Gram Stain 02/25/2015    Final                   Value:NO WBC SEEN NO SQUAMOUS EPITHELIAL CELLS SEEN NO ORGANISMS SEEN Performed at [facility]   . Culture 02/25/2015    Final                   Value:NO GROWTH 2 DAYS Performed at [facility]   . Report Status 02/25/2015 02/27/2015 FINAL   Final     RADIOGRAPHIC STUDIES: Dg Chest 2 View  03/02/2015   CLINICAL DATA:  Metastatic breast cancer. Malignant pleural effusion.  EXAM: CHEST  2 VIEW  COMPARISON:  01/13/2015  FINDINGS: Small right pleural effusion has developed since the prior study. No effusion on the left.  Mild right middle lobe airspace disease similar to the prior study may represent radiation change as noted on CT.  Negative for heart failure. No mass lesion. Port-A-Cath tip in the SVC  IMPRESSION: Interval development of small right effusion. This may be related to metastatic disease. Pleural effusion has improved since 10/31/2014 but has progressed from the more recent study.  Mild right middle lobe airspace disease, possibly radiation change as noted on CT.   Electronically Signed   By: Franchot Gallo M.D.   On: 03/02/2015 11:13   Ir Radiologist Eval & Mgmt  03/01/2015   CONSULTATION: See note in [facility] Epic   Electronically Signed   By: Lucrezia Europe M.D.   On: 03/01/2015 12:01   Left back:     ASSESSMENT/PLAN:    Breast cancer of upper-outer quadrant of right female breast Patient received her last Abraxane chemotherapy on 02/15/2015.  She has been dealing with a left upper chest infection at the site of her old Port-A-Cath insertion.  Interventional radiology has been providing evaluation and management of the infection site; the patient  continues to take both Keflex and Bactrim antibiotics.  Patient also has a history of pleural effusions in the past; has undergone 2 different thoracentesis in the past.  She is now complaining of some left upper back discomfort and increasing left upper extremity numbness and tingling.  She denies any known injury or trauma to her arms or back.  She also denies any recent fevers or chills.  Patient will undergo a chest CT with contrast for further evaluation tomorrow 03/03/2015 at 3 PM.  Patient is scheduled for a recheck of her previous Port-A-Cath infection site per interventional radiology on 03/05/2015.  She is also scheduled for labs, visit, and her next cycle of chemotherapy on 03/05/2015 as well.  Central line infection Patient has been dealing with a left upper chest infection at the site of her old Port-A-Cath insertion.  Interventional radiology has been  providing evaluation and management of the infection site; and the patient continues to take both Keflex and Bactrim antibiotics.  Patient had her last recheck per interventional radiology just yesterday 03/01/2015; where they evaluated the site and packed the wound.  Interventional radiology reports that the new Port-A-Cath also inserted to the left chest is okay to use for any type of infusions.  Patient is scheduled for a recheck of her previous Port-A-Cath infection site per interventional radiology on 03/05/2015.  She is also scheduled for labs, visit, and her next cycle of chemotherapy on 03/05/2015 as well.  Cancer associated pain Patient received her last Abraxane chemotherapy on 02/15/2015.  She has been dealing with a left upper chest infection at the site of her old Port-A-Cath insertion.  Interventional radiology has been providing evaluation and management of the infection site; the patient continues to take both Keflex and Bactrim antibiotics.  Patient also has a history of pleural effusions in the past; has undergone 2  different thoracentesis in the past.  She is now complaining of some left upper back discomfort and increasing left upper extremity numbness and tingling.  She denies any known injury or trauma to her arms or back.  She also denies any recent fevers or chills.  On exam.-Patient with clear breath sounds bilaterally.  No cough, no wheeze, no shortness of breath noted.  On exam of the left side of her back.-Patient has 1 tiny lesion that appears to be resolving.  Patient states that this is most likely a bug bite that she scratched.  There is no other obvious rash to her back.  See attached photos in chart.  Patient will undergo a chest CT with contrast for further evaluation tomorrow 03/03/2015 at 3 PM.  Patient is scheduled for a recheck of her previous Port-A-Cath infection site per interventional radiology on 03/05/2015.  She is also scheduled for labs, visit, and her next cycle of chemotherapy on 03/05/2015 as well.   Patient stated understanding of all instructions; and was in agreement with this plan of care. The patient knows to call the clinic with any problems, questions or concerns.   Review/collaboration with Dr. [name] regarding all aspects of patient's visit today.   Total time spent with patient was 25 minutes;  with greater than 75 percent of that time spent in face to face counseling regarding patient's symptoms,  and coordination of care and follow up.  Disclaimer:This dictation was prepared with Dragon/digital dictation along with [employer]. Any transcriptional errors that result from this process are unintentional.  Drue Second, NP 03/02/2015

## 2015-03-02 NOTE — Assessment & Plan Note (Signed)
Patient received her last Abraxane chemotherapy on 02/15/2015.  She has been dealing with a left upper chest infection at the site of her old Port-A-Cath insertion.  Interventional radiology has been providing evaluation and management of the infection site; the patient continues to take both Keflex and Bactrim antibiotics.  Patient also has a history of pleural effusions in the past; has undergone 2 different thoracentesis in the past.  She is now complaining of some left upper back discomfort and increasing left upper extremity numbness and tingling.  She denies any known injury or trauma to her arms or back.  She also denies any recent fevers or chills.  On exam.-Patient with clear breath sounds bilaterally.  No cough, no wheeze, no shortness of breath noted.  On exam of the left side of her back.-Patient has 1 tiny lesion that appears to be resolving.  Patient states that this is most likely a bug bite that she scratched.  There is no other obvious rash to her back.  See attached photos in chart.  Patient will undergo a chest CT with contrast for further evaluation tomorrow 03/03/2015 at 3 PM.  Patient is scheduled for a recheck of her previous Port-A-Cath infection site per interventional radiology on 03/05/2015.  She is also scheduled for labs, visit, and her next cycle of chemotherapy on 03/05/2015 as well.

## 2015-03-02 NOTE — Telephone Encounter (Signed)
Patient called requesting Dr. Virgie Dad nurse.   "Concerned about scheduling of scans and chemotherapy has already been scheduled.  I need to know if the Abraxane is still working and scans should be done after my next treatment.  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  My back hurts on the left side.  And when I try to deep breath, I have a catching cough.  No s.o.b but a little crackling, no wheezing.  No swelling but the lymphadema to my right arm is more than usual.  I take Clariritin D, cough is productive from lungs after lying flat, otherwise it's nasal drainage."  Return number [phone].  Collaborative nurse notified and will return call.

## 2015-03-02 NOTE — Assessment & Plan Note (Signed)
Patient has been dealing with a left upper chest infection at the site of her old Port-A-Cath insertion.  Interventional radiology has been providing evaluation and management of the infection site; and the patient continues to take both Keflex and Bactrim antibiotics.  Patient had her last recheck per interventional radiology just yesterday 03/01/2015; where they evaluated the site and packed the wound.  Interventional radiology reports that the new Port-A-Cath also inserted to the left chest is okay to use for any type of infusions.  Patient is scheduled for a recheck of her previous Port-A-Cath infection site per interventional radiology on 03/05/2015.  She is also scheduled for labs, visit, and her next cycle of chemotherapy on 03/05/2015 as well.

## 2015-03-02 NOTE — Progress Notes (Signed)
Addendum: Patient underwent a chest x-ray today due to concern of pleural effusion. Chest x-ray revealed interval development of a small right pleural effusion only.    No specific cause found for the left upper back discomfort. Results reviewed with Dr. [name].

## 2015-03-03 ENCOUNTER — Ambulatory Visit (HOSPITAL_COMMUNITY): Payer: BLUE CROSS/BLUE SHIELD

## 2015-03-03 ENCOUNTER — Encounter (HOSPITAL_COMMUNITY): Payer: Self-pay

## 2015-03-03 ENCOUNTER — Ambulatory Visit (HOSPITAL_COMMUNITY)
Admission: RE | Admit: 2015-03-03 | Discharge: 2015-03-03 | Disposition: A | Discharge status: Home / Self Care | Payer: BLUE CROSS/BLUE SHIELD | Source: Ambulatory Visit | Attending: Nurse Practitioner | Admitting: Nurse Practitioner

## 2015-03-03 DIAGNOSIS — I313 Pericardial effusion (noninflammatory): Secondary | ICD-10-CM | POA: Diagnosis not present

## 2015-03-03 DIAGNOSIS — C50411 Malignant neoplasm of upper-outer quadrant of right female breast: Secondary | ICD-10-CM | POA: Diagnosis not present

## 2015-03-03 DIAGNOSIS — J90 Pleural effusion, not elsewhere classified: Secondary | ICD-10-CM | POA: Insufficient documentation

## 2015-03-03 MED ORDER — IOHEXOL 300 MG/ML  SOLN
75.0000 mL | Freq: Once | INTRAMUSCULAR | Status: AC | PRN
  Administered 2015-03-03: 75 mL via INTRAVENOUS

## 2015-03-04 ENCOUNTER — Other Ambulatory Visit (HOSPITAL_COMMUNITY): Payer: BLUE CROSS/BLUE SHIELD

## 2015-03-05 ENCOUNTER — Ambulatory Visit (HOSPITAL_BASED_OUTPATIENT_CLINIC_OR_DEPARTMENT_OTHER): Payer: BLUE CROSS/BLUE SHIELD

## 2015-03-05 ENCOUNTER — Ambulatory Visit (HOSPITAL_BASED_OUTPATIENT_CLINIC_OR_DEPARTMENT_OTHER): Payer: BLUE CROSS/BLUE SHIELD | Admitting: Nurse Practitioner

## 2015-03-05 ENCOUNTER — Other Ambulatory Visit (HOSPITAL_BASED_OUTPATIENT_CLINIC_OR_DEPARTMENT_OTHER): Payer: BLUE CROSS/BLUE SHIELD

## 2015-03-05 ENCOUNTER — Other Ambulatory Visit: Payer: Self-pay | Admitting: Oncology

## 2015-03-05 ENCOUNTER — Encounter: Payer: Self-pay | Admitting: Nurse Practitioner

## 2015-03-05 ENCOUNTER — Ambulatory Visit (HOSPITAL_COMMUNITY)
Admission: RE | Admit: 2015-03-05 | Discharge: 2015-03-05 | Disposition: A | Discharge status: Home / Self Care | Payer: BLUE CROSS/BLUE SHIELD | Source: Ambulatory Visit | Attending: Interventional Radiology | Admitting: Interventional Radiology

## 2015-03-05 VITALS — BP 106/63 | HR 107 | Temp 98.3°F | Resp 18 | Ht 67.0 in | Wt 166.3 lb

## 2015-03-05 DIAGNOSIS — C7951 Secondary malignant neoplasm of bone: Secondary | ICD-10-CM

## 2015-03-05 DIAGNOSIS — C50919 Malignant neoplasm of unspecified site of unspecified female breast: Secondary | ICD-10-CM

## 2015-03-05 DIAGNOSIS — Z5111 Encounter for antineoplastic chemotherapy: Secondary | ICD-10-CM | POA: Diagnosis not present

## 2015-03-05 DIAGNOSIS — C778 Secondary and unspecified malignant neoplasm of lymph nodes of multiple regions: Secondary | ICD-10-CM

## 2015-03-05 DIAGNOSIS — C50411 Malignant neoplasm of upper-outer quadrant of right female breast: Secondary | ICD-10-CM

## 2015-03-05 DIAGNOSIS — C787 Secondary malignant neoplasm of liver and intrahepatic bile duct: Secondary | ICD-10-CM

## 2015-03-05 DIAGNOSIS — C7802 Secondary malignant neoplasm of left lung: Secondary | ICD-10-CM

## 2015-03-05 DIAGNOSIS — C7931 Secondary malignant neoplasm of brain: Secondary | ICD-10-CM

## 2015-03-05 DIAGNOSIS — C7971 Secondary malignant neoplasm of right adrenal gland: Secondary | ICD-10-CM

## 2015-03-05 DIAGNOSIS — C50911 Malignant neoplasm of unspecified site of right female breast: Secondary | ICD-10-CM

## 2015-03-05 DIAGNOSIS — C50019 Malignant neoplasm of nipple and areola, unspecified female breast: Secondary | ICD-10-CM

## 2015-03-05 DIAGNOSIS — C7801 Secondary malignant neoplasm of right lung: Secondary | ICD-10-CM

## 2015-03-05 DIAGNOSIS — Z171 Estrogen receptor negative status [ER-]: Secondary | ICD-10-CM

## 2015-03-05 LAB — CBC WITH DIFFERENTIAL/PLATELET
BASO%: 0.6 % (ref 0.0–2.0)
Basophils Absolute: 0 10*3/uL (ref 0.0–0.1)
EOS%: 0.7 % (ref 0.0–7.0)
Eosinophils Absolute: 0 10*3/uL (ref 0.0–0.5)
HCT: 34.7 % — ABNORMAL LOW (ref 34.8–46.6)
HEMOGLOBIN: 11.7 g/dL (ref 11.6–15.9)
LYMPH%: 12.3 % — AB (ref 14.0–49.7)
MCH: 35.5 pg — ABNORMAL HIGH (ref 25.1–34.0)
MCHC: 33.8 g/dL (ref 31.5–36.0)
MCV: 105 fL — ABNORMAL HIGH (ref 79.5–101.0)
MONO#: 0.3 10*3/uL (ref 0.1–0.9)
MONO%: 7.2 % (ref 0.0–14.0)
NEUT%: 79.2 % — ABNORMAL HIGH (ref 38.4–76.8)
NEUTROS ABS: 3.1 10*3/uL (ref 1.5–6.5)
Platelets: 328 10*3/uL (ref 145–400)
RBC: 3.31 10*6/uL — AB (ref 3.70–5.45)
RDW: 15.8 % — AB (ref 11.2–14.5)
WBC: 4 10*3/uL (ref 3.9–10.3)
lymph#: 0.5 10*3/uL — ABNORMAL LOW (ref 0.9–3.3)

## 2015-03-05 LAB — COMPREHENSIVE METABOLIC PANEL (CC13)
ALBUMIN: 3 g/dL — AB (ref 3.5–5.0)
ALK PHOS: 183 U/L — AB (ref 40–150)
ALT: 23 U/L (ref 0–55)
AST: 21 U/L (ref 5–34)
Anion Gap: 10 mEq/L (ref 3–11)
BUN: 6.6 mg/dL — AB (ref 7.0–26.0)
CO2: 25 meq/L (ref 22–29)
Calcium: 8.9 mg/dL (ref 8.4–10.4)
Chloride: 105 mEq/L (ref 98–109)
Creatinine: 0.7 mg/dL (ref 0.6–1.1)
EGFR: >90 mL/min/{1.73_m2} (ref >90)
GLUCOSE: 76 mg/dL (ref 70–140)
POTASSIUM: 4.8 meq/L (ref 3.5–5.1)
SODIUM: 141 meq/L (ref 136–145)
TOTAL PROTEIN: 6.6 g/dL (ref 6.4–8.3)
Total Bilirubin: <0.3 mg/dL (ref 0.20–1.20)

## 2015-03-05 MED ORDER — HEPARIN SOD (PORK) LOCK FLUSH 100 UNIT/ML IV SOLN
500.0000 [IU] | Freq: Once | INTRAVENOUS | Status: AC | PRN
  Administered 2015-03-05: 500 [IU]
  Filled 2015-03-05: qty 5

## 2015-03-05 MED ORDER — SODIUM CHLORIDE 0.9 % IV SOLN
Freq: Once | INTRAVENOUS | Status: AC
  Administered 2015-03-05: 11:00:00 via INTRAVENOUS

## 2015-03-05 MED ORDER — SODIUM CHLORIDE 0.9 % IJ SOLN
10.0000 mL | INTRAMUSCULAR | Status: DC | PRN
  Administered 2015-03-05: 10 mL
  Filled 2015-03-05: qty 10

## 2015-03-05 MED ORDER — SODIUM CHLORIDE 0.9 % IV SOLN
Freq: Once | INTRAVENOUS | Status: AC
  Administered 2015-03-05: 11:00:00 via INTRAVENOUS
  Filled 2015-03-05: qty 4

## 2015-03-05 MED ORDER — PACLITAXEL PROTEIN-BOUND CHEMO INJECTION 100 MG
100.0000 mg/m2 | Freq: Once | INTRAVENOUS | Status: AC
  Administered 2015-03-05: 200 mg via INTRAVENOUS
  Filled 2015-03-05: qty 40

## 2015-03-05 NOTE — Progress Notes (Signed)
And a Patient ID: Nicole Valentine, female   DOB: [birth year], 49 y.o.   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 She had a mammogram performed that showed dense breasts bilaterally. Ultrasound of the right breast showed 2.3 x 1.9 cm area of abnormality with multiple abnormal lymph nodes. MRI of the bilateral breasts showed asymmetrical enhancement throughout the right breast consistent with multicentric disease. An ultrasound-guided biopsy performed. The known area of disease measured 1.9 x 1.9 x 2.3 cm. Multiple abnormal positive right axillary lymph nodes were noted. 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 Biopsy of the right axillary lymph node was positive for malignancy with extracapsular extension. Patient was originally seen by Dr. [name] Dr. [name] and Dr. [name]. She has elected to have a right mastectomy eventually and declined biopsies of any other areas within the breast."  She went on to receive neoadjuvant chemotherapy and attained a complete pathologic response, as documented below  Buhl noted a very small right supraclavicular mass 09/25/2014, which she brought to our attention. She was having some allergy symptoms at the time so we decided to reevaluate this after a few weeks and on 10/22/2014 as the mass persisted we obtained a restaging neck CT scan, 10/28/2014. There was bilateral supraclavicular adenopathy, but also a large right pleural effusion was incidentally noted. We proceeded to right  thoracentesis 10/29/2014, and a liter of hazy yellow fluid was removed. Cytology from this procedure ([id number]) showed malignant cells consistent with adenocarcinoma, estrogen and progesterone receptor negative, HER-2 not amplified, with a signals ratio 1.12 and the number per cell being 2.75, and with an MIB-1 of 80%. I called [name] with these results and set her up for a PET scan performed 11/06/2014, which shows widespread metastatic disease involving particularly the right lung, liver, and bones, but also the left lung, right adrenal, and multiple lymph node areas.  Her subsequent history is as detailed below.  INTERVAL HISTORY: Nicole Valentine returns today for follow-up of her metastatic breast cancer. Today she is due for day 1, cycle 6 of abraxane with neulasta given on day 9 for granulocyte support. The interval history is remarkable for a trip to the ED and our symptom management clinic for recurrent cellulitis to her previous left chest port site. Of course that has since been removed. She presented with pain, chills, erythema, and warmth with minimal drainage. She was started on kelfex and bactim, and the area is improved. She has had a follow up visit with Interventional Radiology who packed the dressing with medicated gauze. She will return to see them this afternoon.   REVIEW OF S/YSTEMS: Nicole Valentine denies nausea or vomiting. She is moving her bowels well. She denies mouth sores or rashes. Her appetite is good. She has no shortness of breath, chest pain, cough, or palpitations. She had some upper mid back pain that was relieved with tylenol. She has some mild sinus congestion.  A detailed review of systems is otherwise stable.  PAST MEDICAL HISTORY: Past Medical History  Diagnosis Date  . Complication of anesthesia  her father and uncle both had very difficult time waking up-was  something they told them may be hereditory. she will find out.  [name] Hypothyroidism   . Allergy     almond  =itchy lips  . Radiation 01/21/13-03/06/13    Right Breast  . Breast cancer ([place]) dx'd 04-10-12-rt    PAST SURGICAL HISTORY: Past Surgical History  Procedure Laterality Date  . Wisdom tooth extraction    . Portacath placement  04/22/2012    Procedure: INSERTION PORT-A-CATH;  Surgeon: [name], MD;  Location: [facility];  Service: General;  Laterality: Left;  [name] Modified mastectomy Right 11/18/2012    Procedure:  RIGHT MODIFIED MASTECTOMY;  Surgeon: [name], MD;  Location: [facility];  Service: General;  Laterality: Right;  . Knee arthroscopy Right 11/26/2013  . Port-a-cath removal Left 12/10/2013    Procedure: MINOR REMOVAL PORT-A-CATH;  Surgeon: [name], MD;  Location: [facility];  Service: General;  Laterality: Left;    FAMILY HISTORY Family History  Problem Relation Age of Onset  . Lung cancer Maternal Grandfather 64    smoker  . Prostate cancer Maternal Grandfather 90  . Throat cancer Other     Great Aunt x 2  . Liver cancer Other     Maternal Great Grandmother  . Melanoma Maternal Uncle 81  . Brain cancer Cousin 11    non-malignant  the patient's parents are living, in their early 60s. The patient has 2 brothers, no sisters. The patient's mother was diagnosed with breast cancer, HER-2 positive, in 2014 in [place].  GYNECOLOGIC HISTORY:   (Updated 10/03/2013) Menarche age 26, first live birth age 22. She is GX P4. LMP January 2014. 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  The children are currently ages 81, 70, 72, and 28. Her husband, [name], is a Customer service manager.He just started a job for [employer].  They attend the [employer] DIRECTIVES: The patient's husband is her HCPOA   HEALTH MAINTENANCE:  (Updated 10/03/2013) Social History  Substance Use Topics  . Smoking status:  Never Smoker   . Smokeless tobacco: Never Used  . Alcohol Use: No     Colonoscopy: Never  PAP: November 2013/Dr. Smith  Bone density: January 2015, [facility], normal  Lipid panel:  Not on file  Allergies  Allergen Reactions  . Almond Meal Rash  . Almond Oil Rash  . Other Rash    LOTIONS-unknown type  . Tegaderm Ag Mesh [Silver] Rash  . Vancomycin Rash    Red Man Syndrome    Current Outpatient Prescriptions  Medication Sig Dispense Refill  . cephALEXin (KEFLEX) 500 MG capsule Take 1 capsule (500 mg total) by mouth 4 (four) times daily. 40 capsule 0  . cholecalciferol (VITAMIN D) 1000 UNITS tablet Take 2,000 Units by mouth 2 (two) times daily.     [name] dexamethasone (DECADRON) 4 MG tablet Taper as instructed. 1/2 tablet daily until 9-26, then 1/2 tab QOD until 10/10. Then, stop. 20 tablet 0  . ibuprofen (ADVIL,MOTRIN) 200 MG tablet Take 400 mg by mouth every 6 (six) hours as needed for headache or moderate pain.    [name] levothyroxine (SYNTHROID, LEVOTHROID) 175 MCG tablet Take 175 mcg by mouth daily before breakfast.    . loratadine (CLARITIN) 10 MG tablet Take 10 mg by mouth daily.    [name]  Loratadine-Pseudoephedrine (CLARITIN-D 12 HOUR PO) Take 1 tablet by mouth 2 (two) times daily as needed (allergies).    . LORazepam (ATIVAN) 0.5 MG tablet Take 1 tablet (0.5 mg total) by mouth at bedtime as needed for sleep. 30 tablet 0  . potassium chloride (K-DUR) 10 MEQ tablet Take 1 tablet (10 mEq total) by mouth daily. 90 tablet 3  . PRESCRIPTION MEDICATION CHEMO [facility]    . saccharomyces boulardii (FLORASTOR) 250 MG capsule Take 1 capsule (250 mg total) by mouth 2 (two) times daily. 60 capsule 2  . lidocaine (XYLOCAINE) 2 % solution Patient: Mix 1part 2% viscous lidocaine, 1part H20. Swallow 26m of this mixture, 333m before meals and at bedtime, up to QID (Patient not taking: Reported on 02/15/2015) 100 mL 3  . nystatin cream (MYCOSTATIN) Apply 1 application topically 2 (two) times daily. (Patient not  taking: Reported on 01/18/2015) 30 g 0  . ondansetron (ZOFRAN) 8 MG tablet Take 1 tablet (8 mg total) by mouth 2 (two) times daily. Start the day after chemo for 2 days. Then take as needed for nausea or vomiting. (Patient not taking: Reported on 01/11/2015) 30 tablet 1  . prochlorperazine (COMPAZINE) 10 MG tablet Take 1 tablet (10 mg total) by mouth every 6 (six) hours as needed (Nausea or vomiting). (Patient not taking: Reported on 01/11/2015) 30 tablet 1  . sucralfate (CARAFATE) 1 G tablet Dissolve 1 tablet in 10 mL H20 and swallow 5 min prior to meals and bedtime. (Patient not taking: Reported on 02/25/2015) 30 tablet 3   No current facility-administered medications for this visit.   Facility-Administered Medications Ordered in Other Visits  Medication Dose Route Frequency Provider Last Rate Last Dose  . alteplase (CATHFLO ACTIVASE) injection 2 mg  2 mg Intracatheter Once PRN HeLaurie PandaNP      . anticoagulant sodium citrate solution 5 mL  5 mL Intracatheter PRN Nyhla Mountjoy F Surabhi Gadea, NP      . heparin lock flush 100 unit/mL  250 Units Intracatheter PRN HeLaurie PandaNP      . heparin lock flush 100 unit/mL  500 Units Intracatheter PRN HeLaurie PandaNP      . sodium chloride 0.9 % injection 10 mL  10 mL Intracatheter PRN GuChauncey CruelMD   10 mL at 12/28/14 1141  . sodium chloride 0.9 % injection 10 mL  10 mL Intracatheter PRN HeLaurie PandaNP      . sodium chloride 0.9 % injection 10 mL  10 mL Intracatheter PRN HeLaurie PandaNP      . sodium chloride 0.9 % injection 10 mL  10 mL Intracatheter PRN HeLaurie PandaNP      . sodium chloride 0.9 % injection 10 mL  10 mL Intracatheter PRN HeLaurie PandaNP      . sodium chloride 0.9 % injection 10 mL  10 mL Intracatheter PRN GuChauncey CruelMD   10 mL at 03/05/15 1251  . sodium chloride 0.9 % injection 3 mL  3 mL Intravenous PRN GuChauncey CruelMD      . vancomycin (VANCOCIN) 1,250 mg in sodium chloride 0.9 %  500 mL IVPB  1,250 mg Intravenous Once GuChauncey CruelMD          OBJECTIVE: Middle-aged white woman in no acute distress Filed Vitals:   03/05/15 0928  BP: 106/63  Pulse: 107  Temp: 98.3 F (36.8 C)  Resp: 18  Body mass index is 26.04 kg/(m^2).    ECOG FS:1 - Symptomatic but completely ambulatory Filed Weights   03/05/15 0928  Weight: 166 lb 4.8 oz (75.433 kg)   Skin: warm, dry  HEENT: sclerae anicteric, conjunctivae pink, oropharynx clear. No thrush or mucositis.  Lymph Nodes: No cervical or supraclavicular lymphadenopathy  Lungs: clear to auscultation bilaterally, no rales, wheezes, or rhonci  Heart: regular rate and rhythm  Abdomen: round, soft, non tender, positive bowel sounds  Musculoskeletal: No focal spinal tenderness, no peripheral edema  Neuro: non focal, well oriented, positive affect  Breast:deferred. Older port site covered with medicated guaze  LAB RESULTS:   Lab Results  Component Value Date   WBC 4.0 03/05/2015   NEUTROABS 3.1 03/05/2015   HGB 11.7 03/05/2015   HCT 34.7* 03/05/2015   MCV 105.0* 03/05/2015   PLT 328 03/05/2015      Chemistry      Component Value Date/Time   NA 141 03/05/2015 0908   NA 138 02/25/2015 0203   K 4.8 03/05/2015 0908   K 3.4* 02/25/2015 0203   CL 105 02/25/2015 0203   CL 101 10/18/2012 0859   CO2 25 03/05/2015 0908   CO2 25 02/25/2015 0203   BUN 6.6* 03/05/2015 0908   BUN 7 02/25/2015 0203   CREATININE 0.7 03/05/2015 0908   CREATININE 0.54 02/25/2015 0203      Component Value Date/Time   CALCIUM 8.9 03/05/2015 0908   CALCIUM 8.3* 02/25/2015 0203   ALKPHOS 183* 03/05/2015 0908   ALKPHOS 94 11/17/2014 2205   AST 21 03/05/2015 0908   AST 22 11/17/2014 2205   ALT 23 03/05/2015 0908   ALT 20 11/17/2014 2205   BILITOT <0.30 03/05/2015 0908   BILITOT 0.6 11/17/2014 2205       STUDIES: Dg Chest 2 View  03/02/2015  CLINICAL DATA:  Metastatic breast cancer. Malignant pleural effusion. EXAM: CHEST  2  VIEW COMPARISON:  01/13/2015 FINDINGS: Small right pleural effusion has developed since the prior study. No effusion on the left. Mild right middle lobe airspace disease similar to the prior study may represent radiation change as noted on CT. Negative for heart failure. No mass lesion. Port-A-Cath tip in the SVC IMPRESSION: Interval development of small right effusion. This may be related to metastatic disease. Pleural effusion has improved since 10/31/2014 but has progressed from the more recent study. Mild right middle lobe airspace disease, possibly radiation change as noted on CT. Electronically Signed   By: Franchot Gallo M.D.   On: 03/02/2015 11:13   Ct Chest W Contrast  03/03/2015  CLINICAL DATA:  Restaging of breast cancer. Diagnosed 2013 with metastasis to bone. Ongoing chemotherapy. Right mastectomy. Bilateral back pain. Left chest port infection. EXAM: CT CHEST WITH CONTRAST TECHNIQUE: Multidetector CT imaging of the chest was performed during intravenous contrast administration. CONTRAST:  92m OMNIPAQUE IOHEXOL 300 MG/ML  SOLN COMPARISON:  Plain film of 1 day prior. Most recent CT of 01/13/2015. FINDINGS: Mediastinum/Nodes: No supraclavicular adenopathy. A low right jugular node is 6 mm, not pathologic by size criteria. Right mastectomy and axillary node dissection. No axillary adenopathy. A left-sided Port-A-Cath terminates at the superior caval/ atrial junction. Edema is identified superior and lateral to the port site, including on image/series 8/2. Mild cardiomegaly. Small pericardial effusion is new. No central pulmonary embolism, on this non-dedicated study. No well-defined middle mediastinal adenopathy. Ill-defined soft tissue fullness in the right hilum and suprahilar region. This measures on the order of 1.9 x 2.0  cm on image/series 26/2. This is felt to be increased and more cephalad position than hilar soft tissue fullness on the prior. Left infrahilar node measures 1.0 cm on image 30  versus similar on the prior exam (when remeasured). Soft tissue thickening along the transverse aorta measures maximally 8 mm on image 19, new. Multiple small prevascular nodes. Not pathologic by size criteria. Increased in size, measuring maximally 5 mm today. No internal mammary adenopathy. Lungs/Pleura: Small right pleural effusion is increased since 01/13/2015. Subpleural right upper lobe radiation fibrosis. Somewhat more confluent subpleural right middle lobe opacity is slightly increased including on image/series 32/5. This is also felt represent radiation fibrosis. Slightly improved right base aeration with patchy dependent opacity remaining (likely atelectasis). Minimal left upper lobe subpleural nodularity, including on image/series 19/5 is felt to be unchanged. Upper abdomen: Normal imaged portions of the liver, spleen, stomach, gallbladder, biliary tract, adrenal glands, kidneys. Musculoskeletal: Superior endplate sclerosis and irregularity at T9 is similar. Mild vertebral body height loss. No ventral canal encroachment. IMPRESSION: 1. Increased soft tissue fullness about the left hilum and along the lateral aspect of the transverse aorta. Findings are suspicious for progressive disease. This could either be re-evaluated at followup or more entirely characterized with PET. 2. Increase in small right pleural effusion. 3. Similar T9 sclerosis with mild superior endplate compression deformity. This could be metastatic or posttraumatic. 4. Nonspecific edema superior and lateral to the Port-A-Cath site. Given the clinical history, this could represent cellulitis. No abscess. 5. New small pericardial effusion. Electronically Signed   By: Abigail Miyamoto M.D.   On: 03/03/2015 16:07   Ir Radiologist Eval & Mgmt  03/01/2015  CONSULTATION: See note in [facility] Epic Electronically Signed   By: Lucrezia Europe M.D.   On: 03/01/2015 12:01   Ir Radiologist Eval & Mgmt  02/25/2015  EXAM: ESTABLISHED PATIENT OFFICE VISIT CHIEF  COMPLAINT: My port site is infected. HISTORY OF PRESENT ILLNESS: Nicole Valentine is a 50 year old female with a history of metastatic breast carcinoma, who has been receiving chemotherapy via port catheter. Her first port catheter was placed 11/12/2014 via a left IJ approach. This port catheter became infected, and required removal which occurred 01/08/2015. After a line holiday, she had a second left-sided port catheter placed 02/01/2015. She reports that she was healing fine, when suddenly the left chest wall at the site of the explant became inflamed and red with pruritus, and she went to the emergency department 02/24/2015. The wound was cultured and she was started on empiric antibiotic therapy for cellulitis. She presents today for evaluation. She denies fevers, rigors, or chills. Maximum temperature measured before are arrival was 100.5, although she never felt febrile. No flu-like symptoms. She reports that after her last treatment she felt fatigued, which did feel more fatigued than usual after chemotherapy, though this has improved. She and her family have a planned trip to [place], leaving on Sunday October 9, with return on Wednesday October 12. She has her next planned chemotherapy on Friday October 14th. She reports that her redness and irritation at the explant site have significantly improved since she was started on antibiotic therapy in the past 24 hours. No drainage or concern for abscess. PHYSICAL EXAMINATION: Directed physical exam demonstrates erythema at the site of the explant, which she is geographically marginated by indelible ink placed by the emergency department physician on the prior day. The diameter of the inked marking is 10-12 cm, and a diameter of the erythema is less than this  margin. No ballotable fluid collection or drainage at the healing incision. The site of the new incision from the new IJ catheter is inferior at the 8 o'clock position, and beyond the erythema and does not  appear to be involved. ASSESSMENT AND PLAN: I agree with continuation of the empiric antibiotic therapy. She does not appear to be bacteremic. Given that there is no foreign body at the site of the explant, and there is no signs of abscess, there is no indication for attempted incision and drainage. She plans on making the trip to [place], with return on Wednesday, and we will see her in follow-up on the following Thursday or Friday to make sure things are improving. Again she has a planned chemotherapy session on Friday October 14th. She understands and agrees with the plan of care. Signed, Dulcy Fanny. Earleen Newport, DO [facility] Radiology Electronically Signed   By: Corrie Mckusick D.O.   On: 02/25/2015 16:45    ASSESSMENT: 49 y.o. BRCA negative [place] woman with triple-negative stage IV breast cancer  (1) an status post right breast upper outer quadrant and right axillary lymph node biopsy 04/04/2012, both positive for an invasive ductal carcinoma, grade 3, triple negative, with an MIB-1 of 25%  (2) Treated neoadjuvantly with  (a) fluorouracil, cyclophosphamide, and epirubicin (at 100 mg/M2) x4 completed 06/07/2012  (b) docetaxel (75 mg/M2) for one dose, 06/21/2012, poorly tolerated  (c) carboplatin and gemcitabine given every 21 days for 6 cycles completed 10/11/2012  (3) status post right modified radical mastectomy 11/18/2012 showing a complete pathologic response--all 16 lymph nodes were benign  (a) no plans for reconstructon  (4) adjuvant radiation therapy completed 03/06/2013  METASTATIC DISEASE June 2016 (5) 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, 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  (6) staging PET scan 11/06/2014 showed significant disease in the middle and lower lobes of the Right lung, Right effusion, multiple liver and bone lesions, as well as left lung nodules, a Right adrenal nodule, and widespread  hypermetabolic adenopathy  (a) brain MRI 11/16/2014 showed multiple cerebellar lesions  (7) abraxane day 1 and day 8 of each 21 day cycle started 11/09/2014  (a) PET scan 01/05/2015 shows an excellent initial response after 3 cycles  (8) zolendronate started 11/16/2014, to be repeated every 12 weeks  (9) Foundation 1 study sent  11/06/2014: patient's sister in law Garvin Fila following [phone])  (a) 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[place], NTRK1, MYC, ARID1A and LYN, none w approved therapies  (b) pazopanib, ponatinib or crizotinib suggested as possible off-protocol options  (10) Brain metastases:  (a) s/p SRS therapy 12/11/2014 to 4 right cerebellar lesions  PLAN: Dr. [name] was present during the visit with Nicole Valentine. He briefly reviewed the results of her most recent chest CT. There are a few areas noted that could represent disease progression, but it is not entirely clear from this scan. At this point he is not encouraged to make any changes to her treatment. She will proceed with day 1, cycle 6 of abraxane as planned and we will obtain a PET scan in the next week or two. She will follow up with Dr. [name] on 10/31 to review the results. If there is clear progression we will have to discontinue this regimen and either choose another treatment or move on to clinical trials.  Dr. [name] has encouraged her to reach out to Dr. Wetzel Bjornstad now and set up an appointment with her for just after the PET scan if possible. That  way she will have the clinical trial options with her for the visit on 10/31.   Nicole Valentine will return in 1 week for day 8 of treatment. She understands and agrees with this plan. She knows the goal of treatment in her case is control. She has been encouraged to call with any issues that might arise before her next visit here.   Laurie Panda, NP   03/05/2015 1:42 PM    ADDENDUM: I discussed the CT scan in detail with Nicole Valentine. While it suggests possibly early  progression, this is by no means certain, and she has had such a good initial response to Abraxane I think the best thing to do at this point is to continue this and formally restage her. She will need a brain MRI as well.  I will contact [name] at [facility] and see if she would like to see her later this month in case we have to make a treatment change. The patient was considering studies there.  Nicole Valentine has a good understanding of this plan and agrees with it. She will call with any problems that may develop before then.  I personally saw this patient and performed a substantive portion of this encounter with the listed APP documented above.   Chauncey Cruel, MD Medical Oncology and Hematology [facility] [address] [place],  [address] Tel. [phone]    Fax. [phone]

## 2015-03-05 NOTE — Patient Instructions (Signed)
[facility] Discharge Instructions for Patients Receiving Chemotherapy  Today you received the following chemotherapy agents: Abraxane.  To help prevent nausea and vomiting after your treatment, we encourage you to take your nausea medication:  Compazine 10 mg every 6 hours as needed.   If you develop nausea and vomiting that is not controlled by your nausea medication, call the clinic.   BELOW ARE SYMPTOMS THAT SHOULD BE REPORTED IMMEDIATELY:  *FEVER GREATER THAN 100.5 F  *CHILLS WITH OR WITHOUT FEVER  NAUSEA AND VOMITING THAT IS NOT CONTROLLED WITH YOUR NAUSEA MEDICATION  *UNUSUAL SHORTNESS OF BREATH  *UNUSUAL BRUISING OR BLEEDING  TENDERNESS IN MOUTH AND THROAT WITH OR WITHOUT PRESENCE OF ULCERS  *URINARY PROBLEMS  *BOWEL PROBLEMS  UNUSUAL RASH Items with * indicate a potential emergency and should be followed up as soon as possible.  Feel free to call the clinic you have any questions or concerns. The clinic phone number is [phone].  Please show the CHEMO ALERT CARD at check-in to the Emergency Department and triage nurse.   

## 2015-03-07 ENCOUNTER — Other Ambulatory Visit: Payer: Self-pay | Admitting: Oncology

## 2015-03-08 ENCOUNTER — Ambulatory Visit: Payer: BLUE CROSS/BLUE SHIELD

## 2015-03-08 ENCOUNTER — Ambulatory Visit: Payer: BLUE CROSS/BLUE SHIELD | Admitting: Nurse Practitioner

## 2015-03-08 ENCOUNTER — Other Ambulatory Visit: Payer: Self-pay | Admitting: *Deleted

## 2015-03-08 ENCOUNTER — Other Ambulatory Visit: Payer: BLUE CROSS/BLUE SHIELD

## 2015-03-08 ENCOUNTER — Telehealth: Payer: Self-pay | Admitting: *Deleted

## 2015-03-08 ENCOUNTER — Other Ambulatory Visit: Payer: Self-pay | Admitting: Oncology

## 2015-03-08 DIAGNOSIS — C50411 Malignant neoplasm of upper-outer quadrant of right female breast: Secondary | ICD-10-CM

## 2015-03-08 NOTE — Progress Notes (Signed)
Received call from [facility] Radiology following up on referral for [place] called to desk nurse on Friday. Patient needs wet to dry dressings changed daily to port site infection. This RN made referral to [place] today and notified Lurlean Leyden at [facility]. Kristen received referral and will start seeing patient tomorrow.

## 2015-03-08 NOTE — Telephone Encounter (Signed)
This RN spoke with patient and told her that [facility] would be calling her to set up nursing to do dressing changes. Patient verbalized understanding.

## 2015-03-12 ENCOUNTER — Other Ambulatory Visit: Payer: BLUE CROSS/BLUE SHIELD

## 2015-03-12 ENCOUNTER — Other Ambulatory Visit: Payer: Self-pay | Admitting: *Deleted

## 2015-03-12 ENCOUNTER — Ambulatory Visit (HOSPITAL_BASED_OUTPATIENT_CLINIC_OR_DEPARTMENT_OTHER): Payer: BLUE CROSS/BLUE SHIELD

## 2015-03-12 ENCOUNTER — Other Ambulatory Visit (HOSPITAL_BASED_OUTPATIENT_CLINIC_OR_DEPARTMENT_OTHER): Payer: BLUE CROSS/BLUE SHIELD

## 2015-03-12 ENCOUNTER — Ambulatory Visit (HOSPITAL_BASED_OUTPATIENT_CLINIC_OR_DEPARTMENT_OTHER): Payer: BLUE CROSS/BLUE SHIELD | Admitting: Nurse Practitioner

## 2015-03-12 ENCOUNTER — Ambulatory Visit: Payer: BLUE CROSS/BLUE SHIELD

## 2015-03-12 ENCOUNTER — Encounter: Payer: Self-pay | Admitting: Nurse Practitioner

## 2015-03-12 VITALS — BP 108/69 | HR 108 | Temp 98.5°F | Resp 18 | Ht 67.0 in | Wt 162.0 lb

## 2015-03-12 DIAGNOSIS — C7801 Secondary malignant neoplasm of right lung: Secondary | ICD-10-CM

## 2015-03-12 DIAGNOSIS — C7951 Secondary malignant neoplasm of bone: Secondary | ICD-10-CM

## 2015-03-12 DIAGNOSIS — C50411 Malignant neoplasm of upper-outer quadrant of right female breast: Secondary | ICD-10-CM

## 2015-03-12 DIAGNOSIS — C50911 Malignant neoplasm of unspecified site of right female breast: Secondary | ICD-10-CM

## 2015-03-12 DIAGNOSIS — Z95828 Presence of other vascular implants and grafts: Secondary | ICD-10-CM

## 2015-03-12 DIAGNOSIS — C7931 Secondary malignant neoplasm of brain: Secondary | ICD-10-CM

## 2015-03-12 DIAGNOSIS — C7802 Secondary malignant neoplasm of left lung: Secondary | ICD-10-CM

## 2015-03-12 DIAGNOSIS — C7971 Secondary malignant neoplasm of right adrenal gland: Secondary | ICD-10-CM

## 2015-03-12 DIAGNOSIS — Z171 Estrogen receptor negative status [ER-]: Secondary | ICD-10-CM

## 2015-03-12 DIAGNOSIS — C778 Secondary and unspecified malignant neoplasm of lymph nodes of multiple regions: Secondary | ICD-10-CM

## 2015-03-12 DIAGNOSIS — C787 Secondary malignant neoplasm of liver and intrahepatic bile duct: Secondary | ICD-10-CM

## 2015-03-12 DIAGNOSIS — Z5111 Encounter for antineoplastic chemotherapy: Secondary | ICD-10-CM

## 2015-03-12 LAB — CBC WITH DIFFERENTIAL/PLATELET
BASO%: 0.6 % (ref 0.0–2.0)
Basophils Absolute: 0 10*3/uL (ref 0.0–0.1)
EOS%: 1.5 % (ref 0.0–7.0)
Eosinophils Absolute: 0 10*3/uL (ref 0.0–0.5)
HCT: 34 % — ABNORMAL LOW (ref 34.8–46.6)
HGB: 11.5 g/dL — ABNORMAL LOW (ref 11.6–15.9)
LYMPH%: 14.8 % (ref 14.0–49.7)
MCH: 34.8 pg — ABNORMAL HIGH (ref 25.1–34.0)
MCHC: 33.8 g/dL (ref 31.5–36.0)
MCV: 102.8 fL — ABNORMAL HIGH (ref 79.5–101.0)
MONO#: 0.2 10*3/uL (ref 0.1–0.9)
MONO%: 5.6 % (ref 0.0–14.0)
NEUT#: 2.4 10*3/uL (ref 1.5–6.5)
NEUT%: 77.5 % — ABNORMAL HIGH (ref 38.4–76.8)
Platelets: 314 10*3/uL (ref 145–400)
RBC: 3.3 10*6/uL — ABNORMAL LOW (ref 3.70–5.45)
RDW: 15.6 % — ABNORMAL HIGH (ref 11.2–14.5)
WBC: 3 10*3/uL — ABNORMAL LOW (ref 3.9–10.3)
lymph#: 0.5 10*3/uL — ABNORMAL LOW (ref 0.9–3.3)

## 2015-03-12 LAB — COMPREHENSIVE METABOLIC PANEL (CC13)
ALT: 17 U/L (ref 0–55)
ANION GAP: 6 meq/L (ref 3–11)
AST: 18 U/L (ref 5–34)
Albumin: 3.2 g/dL — ABNORMAL LOW (ref 3.5–5.0)
Alkaline Phosphatase: 133 U/L (ref 40–150)
BUN: 8.3 mg/dL (ref 7.0–26.0)
CALCIUM: 9.5 mg/dL (ref 8.4–10.4)
CHLORIDE: 105 meq/L (ref 98–109)
CO2: 30 mEq/L — ABNORMAL HIGH (ref 22–29)
CREATININE: 0.6 mg/dL (ref 0.6–1.1)
EGFR: >90 mL/min/{1.73_m2} (ref >90)
Glucose: 84 mg/dl (ref 70–140)
POTASSIUM: 4.1 meq/L (ref 3.5–5.1)
SODIUM: 141 meq/L (ref 136–145)
Total Bilirubin: 0.37 mg/dL (ref 0.20–1.20)
Total Protein: 6.7 g/dL (ref 6.4–8.3)

## 2015-03-12 MED ORDER — LORAZEPAM 0.5 MG PO TABS: 0.5000 mg | ORAL_TABLET | Freq: Every evening | ORAL | Status: DC | PRN

## 2015-03-12 MED ORDER — DEXAMETHASONE SODIUM PHOSPHATE 100 MG/10ML IJ SOLN
Freq: Once | INTRAMUSCULAR | Status: AC
  Administered 2015-03-12: 10:00:00 via INTRAVENOUS
  Filled 2015-03-12: qty 4

## 2015-03-12 MED ORDER — HEPARIN SOD (PORK) LOCK FLUSH 100 UNIT/ML IV SOLN
500.0000 [IU] | Freq: Once | INTRAVENOUS | Status: AC | PRN
  Administered 2015-03-12: 500 [IU]
  Filled 2015-03-12: qty 5

## 2015-03-12 MED ORDER — SODIUM CHLORIDE 0.9 % IV SOLN
Freq: Once | INTRAVENOUS | Status: AC
  Administered 2015-03-12: 10:00:00 via INTRAVENOUS

## 2015-03-12 MED ORDER — PEGFILGRASTIM 6 MG/0.6ML ~~LOC~~ PSKT
6.0000 mg | PREFILLED_SYRINGE | Freq: Once | SUBCUTANEOUS | Status: AC
  Administered 2015-03-12: 6 mg via SUBCUTANEOUS
  Filled 2015-03-12: qty 0.6

## 2015-03-12 MED ORDER — SODIUM CHLORIDE 0.9 % IJ SOLN
10.0000 mL | INTRAMUSCULAR | Status: DC | PRN
  Administered 2015-03-12: 10 mL
  Filled 2015-03-12: qty 10

## 2015-03-12 MED ORDER — HEPARIN SOD (PORK) LOCK FLUSH 100 UNIT/ML IV SOLN
500.0000 [IU] | Freq: Once | INTRAVENOUS | Status: DC
  Filled 2015-03-12: qty 5

## 2015-03-12 MED ORDER — PACLITAXEL PROTEIN-BOUND CHEMO INJECTION 100 MG
100.0000 mg/m2 | Freq: Once | INTRAVENOUS | Status: AC
  Administered 2015-03-12: 200 mg via INTRAVENOUS
  Filled 2015-03-12: qty 40

## 2015-03-12 MED ORDER — SODIUM CHLORIDE 0.9 % IJ SOLN
10.0000 mL | INTRAMUSCULAR | Status: DC | PRN
Start: 2015-03-12 — End: 2015-03-12
  Administered 2015-03-12: 10 mL via INTRAVENOUS
  Filled 2015-03-12: qty 10

## 2015-03-12 NOTE — Progress Notes (Signed)
And a Patient ID: Nicole Valentine, female   DOB: 11-19-1965, 49 y.o.   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 She had a mammogram performed that showed dense breasts bilaterally. Ultrasound of the right breast showed 2.3 x 1.9 cm area of abnormality with multiple abnormal lymph nodes. MRI of the bilateral breasts showed asymmetrical enhancement throughout the right breast consistent with multicentric disease. An ultrasound-guided biopsy performed. The known area of disease measured 1.9 x 1.9 x 2.3 cm. Multiple abnormal positive right axillary lymph nodes were noted. 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 Biopsy of the right axillary lymph node was positive for malignancy with extracapsular extension. Patient was originally seen by Dr. [name] Dr. [name] and Dr. [name]. She has elected to have a right mastectomy eventually and declined biopsies of any other areas within the breast."  She went on to receive neoadjuvant chemotherapy and attained a complete pathologic response, as documented below  [place] noted a very small right supraclavicular mass 09/25/2014, which she brought to our attention. She was having some allergy symptoms at the time so we decided to reevaluate this after a few weeks and on 10/22/2014 as the mass persisted we obtained a restaging neck CT scan, 10/28/2014. There was bilateral supraclavicular adenopathy, but also a large right pleural effusion was incidentally noted. We proceeded to right  thoracentesis 10/29/2014, and a liter of hazy yellow fluid was removed. Cytology from this procedure ([id number]) showed malignant cells consistent with adenocarcinoma, estrogen and progesterone receptor negative, HER-2 not amplified, with a signals ratio 1.12 and the number per cell being 2.75, and with an MIB-1 of 80%. I called Nicole Valentine with these results and set her up for a PET scan performed 11/06/2014, which shows widespread metastatic disease involving particularly the right lung, liver, and bones, but also the left lung, right adrenal, and multiple lymph node areas.  Her subsequent history is as detailed below.  INTERVAL HISTORY: Nicole Valentine returns today for follow-up of her metastatic breast cancer. Today she is due for day 8, cycle 6 of abraxane with neulasta given on day 9 for granulocyte support.   REVIEW OF S/YSTEMS: [place] now has home health coming out twice weekly to changes and pack the dressing to her old port site. She is noticing that is healing well and already starting to close up with minimal erythema. She denies fevers, chills, nausea, or vomiting. In the past month she has had 4 incidences of abdominal cramping followed by several bowel movements in a matter of hours. The BMs are actually formed however, and she returns to normal frequency after the event is over. She is unsure of a trigger. She denies mouth sores or rashes. She has infrequent sensation changes to the middle 3 toes of the right foot. They are normal today. She uses tylenol PRN for mid upper back pain. She fatigued more sore than usual this week. She denies headaches, dizziness, or vision changes. A detailed review of systems is otherwise stable.  PAST MEDICAL HISTORY: Past Medical History  Diagnosis Date  . Complication of anesthesia  her father and uncle both had very difficult time waking up-was  something they told them may be hereditory. she will find out.  [name] Hypothyroidism   . Allergy     almond  =itchy lips  . Radiation 01/21/13-03/06/13    Right Breast  . Breast cancer ([place]) dx'd 04-10-12-rt    PAST SURGICAL HISTORY: Past Surgical History  Procedure Laterality Date  . Wisdom tooth extraction    . Portacath placement  04/22/2012    Procedure: INSERTION PORT-A-CATH;  Surgeon: [name], MD;  Location: [place];  Service: General;  Laterality: Left;  [name] Modified mastectomy Right 11/18/2012    Procedure:  RIGHT MODIFIED MASTECTOMY;  Surgeon: [name], MD;  Location: [facility];  Service: General;  Laterality: Right;  . Knee arthroscopy Right 11/26/2013  . Port-a-cath removal Left 12/10/2013    Procedure: MINOR REMOVAL PORT-A-CATH;  Surgeon: [name], MD;  Location: [facility] [facility];  Service: General;  Laterality: Left;    FAMILY HISTORY Family History  Problem Relation Age of Onset  . Lung cancer Maternal Grandfather 34    smoker  . Prostate cancer Maternal Grandfather 90  . Throat cancer Other     Great Aunt x 2  . Liver cancer Other     Maternal Great Grandmother  . Melanoma Maternal Uncle 81  . Brain cancer Cousin 11    non-malignant  the patient's parents are living, in their early 10s. The patient has 2 brothers, no sisters. The patient's mother was diagnosed with breast cancer, HER-2 positive, in 2014 in [place].  GYNECOLOGIC HISTORY:   (Updated 10/03/2013) Menarche age 72, first live birth age 58. She is GX P4. LMP January 2014. 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  The children are currently ages 13, 35, 71, and 25. Her husband, Nicole Valentine, is a Customer service manager.He just started a job for [employer].  They attend the [employer] DIRECTIVES: The patient's husband is her HCPOA   HEALTH MAINTENANCE:  (Updated 10/03/2013) Social History  Substance Use Topics  . Smoking status:  Never Smoker   . Smokeless tobacco: Never Used  . Alcohol Use: No     Colonoscopy: Never  PAP: November 2013/Dr. Smith  Bone density: January 2015, [facility], normal  Lipid panel:  Not on file  Allergies  Allergen Reactions  . Almond Meal Rash  . Almond Oil Rash  . Other Rash    LOTIONS-unknown type  . Tegaderm Ag Mesh [Silver] Rash  . Vancomycin Rash    Red Man Syndrome    Current Outpatient Prescriptions  Medication Sig Dispense Refill  . cholecalciferol (VITAMIN D) 1000 UNITS tablet Take 2,000 Units by mouth 2 (two) times daily.     [name] ibuprofen (ADVIL,MOTRIN) 200 MG tablet Take 400 mg by mouth every 6 (six) hours as needed for headache or moderate pain.    [name] levothyroxine (SYNTHROID, LEVOTHROID) 175 MCG tablet Take 175 mcg by mouth daily before breakfast.    . Loratadine-Pseudoephedrine (CLARITIN-D 12 HOUR PO) Take 1 tablet by mouth 2 (two) times daily as needed (allergies).    [name] PRESCRIPTION MEDICATION CHEMO [facility]    . saccharomyces boulardii (FLORASTOR) 250 MG capsule Take 1 capsule (250 mg total) by mouth 2 (two) times daily. 60 capsule 2  . loratadine (CLARITIN) 10 MG tablet Take 10 mg by  mouth daily.    [name] LORazepam (ATIVAN) 0.5 MG tablet Take 1 tablet (0.5 mg total) by mouth at bedtime as needed for sleep. 30 tablet 0  . ondansetron (ZOFRAN) 8 MG tablet Take 1 tablet (8 mg total) by mouth 2 (two) times daily. Start the day after chemo for 2 days. Then take as needed for nausea or vomiting. (Patient not taking: Reported on 01/11/2015) 30 tablet 1  . prochlorperazine (COMPAZINE) 10 MG tablet Take 1 tablet (10 mg total) by mouth every 6 (six) hours as needed (Nausea or vomiting). (Patient not taking: Reported on 01/11/2015) 30 tablet 1  . sucralfate (CARAFATE) 1 G tablet Dissolve 1 tablet in 10 mL H20 and swallow 5 min prior to meals and bedtime. (Patient not taking: Reported on 02/25/2015) 30 tablet 3   No current facility-administered medications for this visit.    Facility-Administered Medications Ordered in Other Visits  Medication Dose Route Frequency Provider Last Rate Last Dose  . alteplase (CATHFLO ACTIVASE) injection 2 mg  2 mg Intracatheter Once PRN Laurie Panda, NP      . anticoagulant sodium citrate solution 5 mL  5 mL Intracatheter PRN Natsha Guidry F Dupree Givler, NP      . heparin lock flush 100 unit/mL  250 Units Intracatheter PRN Laurie Panda, NP      . heparin lock flush 100 unit/mL  500 Units Intracatheter PRN Laurie Panda, NP      . sodium chloride 0.9 % injection 10 mL  10 mL Intracatheter PRN Chauncey Cruel, MD   10 mL at 12/28/14 1141  . sodium chloride 0.9 % injection 10 mL  10 mL Intracatheter PRN Laurie Panda, NP      . sodium chloride 0.9 % injection 10 mL  10 mL Intracatheter PRN Laurie Panda, NP      . sodium chloride 0.9 % injection 10 mL  10 mL Intracatheter PRN Laurie Panda, NP      . sodium chloride 0.9 % injection 10 mL  10 mL Intracatheter PRN Laurie Panda, NP      . sodium chloride 0.9 % injection 3 mL  3 mL Intravenous PRN Chauncey Cruel, MD      . vancomycin (VANCOCIN) 1,250 mg in sodium chloride 0.9 % 500 mL IVPB  1,250 mg Intravenous Once Chauncey Cruel, MD          OBJECTIVE: Middle-aged white woman in no acute distress Filed Vitals:   03/12/15 0900  BP: 108/69  Pulse: 108  Temp: 98.5 F (36.9 C)  Resp: 18     Body mass index is 25.37 kg/(m^2).    ECOG FS:1 - Symptomatic but completely ambulatory Filed Weights   03/12/15 0900  Weight: 162 lb (73.483 kg)   Skin: warm, dry  HEENT: sclerae anicteric, conjunctivae pink, oropharynx clear. No thrush or mucositis.  Lymph Nodes: No cervical or supraclavicular lymphadenopathy  Lungs: clear to auscultation bilaterally, no rales, wheezes, or rhonci  Heart: regular rate and rhythm  Abdomen: round, soft, non tender, positive bowel sounds  Musculoskeletal: No focal spinal tenderness, no peripheral edema  Neuro: non focal, well  oriented, positive affect  Breast:deferred. Older port site covered with medicated guaze  LAB RESULTS:   Lab Results  Component Value Date   WBC 3.0* 03/12/2015   NEUTROABS 2.4 03/12/2015   HGB 11.5* 03/12/2015   HCT 34.0* 03/12/2015   MCV 102.8* 03/12/2015   PLT 314 03/12/2015  Chemistry      Component Value Date/Time   NA 141 03/12/2015 0826   NA 138 02/25/2015 0203   K 4.1 03/12/2015 0826   K 3.4* 02/25/2015 0203   CL 105 02/25/2015 0203   CL 101 10/18/2012 0859   CO2 30* 03/12/2015 0826   CO2 25 02/25/2015 0203   BUN 8.3 03/12/2015 0826   BUN 7 02/25/2015 0203   CREATININE 0.6 03/12/2015 0826   CREATININE 0.54 02/25/2015 0203      Component Value Date/Time   CALCIUM 9.5 03/12/2015 0826   CALCIUM 8.3* 02/25/2015 0203   ALKPHOS 133 03/12/2015 0826   ALKPHOS 94 11/17/2014 2205   AST 18 03/12/2015 0826   AST 22 11/17/2014 2205   ALT 17 03/12/2015 0826   ALT 20 11/17/2014 2205   BILITOT 0.37 03/12/2015 0826   BILITOT 0.6 11/17/2014 2205       STUDIES: Dg Chest 2 View  03/02/2015  CLINICAL DATA:  Metastatic breast cancer. Malignant pleural effusion. EXAM: CHEST  2 VIEW COMPARISON:  01/13/2015 FINDINGS: Small right pleural effusion has developed since the prior study. No effusion on the left. Mild right middle lobe airspace disease similar to the prior study may represent radiation change as noted on CT. Negative for heart failure. No mass lesion. Port-A-Cath tip in the SVC IMPRESSION: Interval development of small right effusion. This may be related to metastatic disease. Pleural effusion has improved since 10/31/2014 but has progressed from the more recent study. Mild right middle lobe airspace disease, possibly radiation change as noted on CT. Electronically Signed   By: Franchot Gallo M.D.   On: 03/02/2015 11:13   Ct Chest W Contrast  03/03/2015  CLINICAL DATA:  Restaging of breast cancer. Diagnosed 2013 with metastasis to bone. Ongoing chemotherapy. Right  mastectomy. Bilateral back pain. Left chest port infection. EXAM: CT CHEST WITH CONTRAST TECHNIQUE: Multidetector CT imaging of the chest was performed during intravenous contrast administration. CONTRAST:  67m OMNIPAQUE IOHEXOL 300 MG/ML  SOLN COMPARISON:  Plain film of 1 day prior. Most recent CT of 01/13/2015. FINDINGS: Mediastinum/Nodes: No supraclavicular adenopathy. A low right jugular node is 6 mm, not pathologic by size criteria. Right mastectomy and axillary node dissection. No axillary adenopathy. A left-sided Port-A-Cath terminates at the superior caval/ atrial junction. Edema is identified superior and lateral to the port site, including on image/series 8/2. Mild cardiomegaly. Small pericardial effusion is new. No central pulmonary embolism, on this non-dedicated study. No well-defined middle mediastinal adenopathy. Ill-defined soft tissue fullness in the right hilum and suprahilar region. This measures on the order of 1.9 x 2.0 cm on image/series 26/2. This is felt to be increased and more cephalad position than hilar soft tissue fullness on the prior. Left infrahilar node measures 1.0 cm on image 30 versus similar on the prior exam (when remeasured). Soft tissue thickening along the transverse aorta measures maximally 8 mm on image 19, new. Multiple small prevascular nodes. Not pathologic by size criteria. Increased in size, measuring maximally 5 mm today. No internal mammary adenopathy. Lungs/Pleura: Small right pleural effusion is increased since 01/13/2015. Subpleural right upper lobe radiation fibrosis. Somewhat more confluent subpleural right middle lobe opacity is slightly increased including on image/series 32/5. This is also felt represent radiation fibrosis. Slightly improved right base aeration with patchy dependent opacity remaining (likely atelectasis). Minimal left upper lobe subpleural nodularity, including on image/series 19/5 is felt to be unchanged. Upper abdomen: Normal imaged  portions of the liver, spleen, stomach, gallbladder, biliary tract,  adrenal glands, kidneys. Musculoskeletal: Superior endplate sclerosis and irregularity at T9 is similar. Mild vertebral body height loss. No ventral canal encroachment. IMPRESSION: 1. Increased soft tissue fullness about the left hilum and along the lateral aspect of the transverse aorta. Findings are suspicious for progressive disease. This could either be re-evaluated at followup or more entirely characterized with PET. 2. Increase in small right pleural effusion. 3. Similar T9 sclerosis with mild superior endplate compression deformity. This could be metastatic or posttraumatic. 4. Nonspecific edema superior and lateral to the Port-A-Cath site. Given the clinical history, this could represent cellulitis. No abscess. 5. New small pericardial effusion. Electronically Signed   By: Abigail Miyamoto M.D.   On: 03/03/2015 16:07   Ir Radiologist Eval & Mgmt  03/05/2015  EXAM: ESTABLISHED PATIENT OFFICE VISIT CHIEF COMPLAINT: Patient seen in follow-up today for assessment of left chest wall port pocket HISTORY OF PRESENT ILLNESS: Nicole Valentine is a 49 year old white female, patient of Dr. [name], with history of metastatic breast cancer who is familiar to our service from prior left internal jugular Port-A-Cath placement on 11/12/14. Due to concern for port pocket infection the port was removed on 01/08/15 with primary closure of the incision site. A new left IJ Port-A-Cath was placed on 02/01/15. Earlier this month the patient had noticed some drainage at the incision site of the originally placed Port-A-Cath pocket and was empirically started on antibiotics. Subsequent fluid cultures from the site were negative. Due to persistent drainage from the site she presented again to IR on 03/01/15 and underwent placement of an iodoform gauze to the wound. She presents today for follow-up exam of the site. She states that the previous port pocket site is smaller  than previous, nontender and the newly placed port is functioning well. She denies any recent fevers, chills, chest pain or significant dyspnea. She continues to remain on Keflex and Bactrim. PHYSICAL EXAMINATION: The old left chest wall port pocket was inspected following iodoform gauze removal. Pocket is mildly erythematous but with some noted granulation and slight decrease in size. No evidence of active bleeding. Site is nontender . The adjacent new Port-A-Cath incision site also has minimal erythema and is nontender. There is no drainage noted from the incision site. There is a small fragment of absorbable suture at the lateral margin of the new port site. ASSESSMENT AND PLAN: Patient with appropriately healing old left chest wall Port-A-Cath pocket and stable new left chest wall Port-A-Cath site. At this time would recommend daily wet to dry dressings to old port pocket site by home health nurse until wound pocket has completely healed. Case discussed with Dr. Kathlene Cote. Read by: Rowe Robert, PA-C Electronically Signed   By: Aletta Edouard M.D.   On: 03/05/2015 14:05   Ir Radiologist Eval & Mgmt  03/01/2015  CONSULTATION: See note in [facility] Epic Electronically Signed   By: Lucrezia Europe M.D.   On: 03/01/2015 12:01   Ir Radiologist Eval & Mgmt  02/25/2015  EXAM: ESTABLISHED PATIENT OFFICE VISIT CHIEF COMPLAINT: My port site is infected. HISTORY OF PRESENT ILLNESS: Nicole Valentine is a 49 year old female with a history of metastatic breast carcinoma, who has been receiving chemotherapy via port catheter. Her first port catheter was placed 11/12/2014 via a left IJ approach. This port catheter became infected, and required removal which occurred 01/08/2015. After a line holiday, she had a second left-sided port catheter placed 02/01/2015. She reports that she was healing fine, when suddenly the left chest wall at the site  of the explant became inflamed and red with pruritus, and she went to the emergency department  02/24/2015. The wound was cultured and she was started on empiric antibiotic therapy for cellulitis. She presents today for evaluation. She denies fevers, rigors, or chills. Maximum temperature measured before are arrival was 100.5, although she never felt febrile. No flu-like symptoms. She reports that after her last treatment she felt fatigued, which did feel more fatigued than usual after chemotherapy, though this has improved. She and her family have a planned trip to [place], leaving on Sunday October 9, with return on Wednesday October 12. She has her next planned chemotherapy on Friday October 14th. She reports that her redness and irritation at the explant site have significantly improved since she was started on antibiotic therapy in the past 24 hours. No drainage or concern for abscess. PHYSICAL EXAMINATION: Directed physical exam demonstrates erythema at the site of the explant, which she is geographically marginated by indelible ink placed by the emergency department physician on the prior day. The diameter of the inked marking is 10-12 cm, and a diameter of the erythema is less than this margin. No ballotable fluid collection or drainage at the healing incision. The site of the new incision from the new IJ catheter is inferior at the 8 o'clock position, and beyond the erythema and does not appear to be involved. ASSESSMENT AND PLAN: I agree with continuation of the empiric antibiotic therapy. She does not appear to be bacteremic. Given that there is no foreign body at the site of the explant, and there is no signs of abscess, there is no indication for attempted incision and drainage. She plans on making the trip to [place], with return on Wednesday, and we will see her in follow-up on the following Thursday or Friday to make sure things are improving. Again she has a planned chemotherapy session on Friday October 14th. She understands and agrees with the plan of care. Signed, Dulcy Fanny. Earleen Newport,  DO [facility] Electronically Signed   By: Corrie Mckusick D.O.   On: 02/25/2015 16:45    ASSESSMENT: 49 y.o. BRCA negative [place] woman with triple-negative stage IV breast cancer  (1) an status post right breast upper outer quadrant and right axillary lymph node biopsy 04/04/2012, both positive for an invasive ductal carcinoma, grade 3, triple negative, with an MIB-1 of 25%  (2) Treated neoadjuvantly with  (a) fluorouracil, cyclophosphamide, and epirubicin (at 100 mg/M2) x4 completed 06/07/2012  (b) docetaxel (75 mg/M2) for one dose, 06/21/2012, poorly tolerated  (c) carboplatin and gemcitabine given every 21 days for 6 cycles completed 10/11/2012  (3) status post right modified radical mastectomy 11/18/2012 showing a complete pathologic response--all 16 lymph nodes were benign  (a) no plans for reconstructon  (4) adjuvant radiation therapy completed 03/06/2013  METASTATIC DISEASE June 2016 (5) 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, 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  (6) staging PET scan 11/06/2014 showed significant disease in the middle and lower lobes of the Right lung, Right effusion, multiple liver and bone lesions, as well as left lung nodules, a Right adrenal nodule, and widespread hypermetabolic adenopathy  (a) brain MRI 11/16/2014 showed multiple cerebellar lesions  (7) abraxane day 1 and day 8 of each 21 day cycle started 11/09/2014  (a) PET scan 01/05/2015 shows an excellent initial response after 3 cycles  (8) zolendronate started 11/16/2014, to be repeated every 12 weeks  (9) Foundation 1 study sent  11/06/2014:  patient's sister in law Garvin Fila following [phone])  (a) 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[place], NTRK1, MYC, ARID1A and LYN, none w approved therapies  (b) pazopanib, ponatinib or crizotinib suggested as possible off-protocol options  (10) Brain metastases:  (a) s/p SRS  therapy 12/11/2014 to 4 right cerebellar lesions  PLAN: Nicole Valentine is stable today. The labs were reviewed in detail and were sufficient for treatment. She will proceed with day 8, cycle 6 of abraxane as planned today.   The earliest she was able to make an appointment with Dr. Wetzel Bjornstad of [facility] was 11/2. It is actually an appointment with her nurse practitioner, but Nicole Valentine has been told that Dr. Wetzel Bjornstad will be in the office that day and would share the visit.   Nicole Valentine will will have a PET scan and brain MRI early next week. She will return on 10/31 for a follow up visit with Dr. [name] to review the results. She understands and agrees with this plan. She knows the goal of treatment in her case is control. She has been encouraged to call with any issues that might arise before her next visit here.   Laurie Panda, NP

## 2015-03-12 NOTE — Patient Instructions (Signed)
[facility] Discharge Instructions for Patients Receiving Chemotherapy  Today you received the following chemotherapy agents Abraxane  To help prevent nausea and vomiting after your treatment, we encourage you to take your nausea medication    If you develop nausea and vomiting that is not controlled by your nausea medication, call the clinic.   BELOW ARE SYMPTOMS THAT SHOULD BE REPORTED IMMEDIATELY:  *FEVER GREATER THAN 100.5 F  *CHILLS WITH OR WITHOUT FEVER  NAUSEA AND VOMITING THAT IS NOT CONTROLLED WITH YOUR NAUSEA MEDICATION  *UNUSUAL SHORTNESS OF BREATH  *UNUSUAL BRUISING OR BLEEDING  TENDERNESS IN MOUTH AND THROAT WITH OR WITHOUT PRESENCE OF ULCERS  *URINARY PROBLEMS  *BOWEL PROBLEMS  UNUSUAL RASH Items with * indicate a potential emergency and should be followed up as soon as possible.  Feel free to call the clinic you have any questions or concerns. The clinic phone number is [phone].  Please show the CHEMO ALERT CARD at check-in to the Emergency Department and triage nurse.   

## 2015-03-15 ENCOUNTER — Ambulatory Visit (HOSPITAL_COMMUNITY): Payer: BLUE CROSS/BLUE SHIELD

## 2015-03-16 ENCOUNTER — Telehealth: Payer: Self-pay | Admitting: *Deleted

## 2015-03-16 ENCOUNTER — Ambulatory Visit (HOSPITAL_BASED_OUTPATIENT_CLINIC_OR_DEPARTMENT_OTHER): Payer: BLUE CROSS/BLUE SHIELD

## 2015-03-16 ENCOUNTER — Ambulatory Visit
Admission: RE | Admit: 2015-03-16 | Discharge: 2015-03-16 | Disposition: A | Discharge status: Home / Self Care | Payer: BLUE CROSS/BLUE SHIELD | Source: Ambulatory Visit | Attending: Radiation Oncology | Admitting: Radiation Oncology

## 2015-03-16 DIAGNOSIS — C50411 Malignant neoplasm of upper-outer quadrant of right female breast: Secondary | ICD-10-CM | POA: Diagnosis not present

## 2015-03-16 DIAGNOSIS — Z452 Encounter for adjustment and management of vascular access device: Secondary | ICD-10-CM | POA: Diagnosis not present

## 2015-03-16 DIAGNOSIS — C7971 Secondary malignant neoplasm of right adrenal gland: Secondary | ICD-10-CM

## 2015-03-16 DIAGNOSIS — C7801 Secondary malignant neoplasm of right lung: Secondary | ICD-10-CM | POA: Diagnosis not present

## 2015-03-16 DIAGNOSIS — C7931 Secondary malignant neoplasm of brain: Secondary | ICD-10-CM

## 2015-03-16 DIAGNOSIS — C787 Secondary malignant neoplasm of liver and intrahepatic bile duct: Secondary | ICD-10-CM

## 2015-03-16 DIAGNOSIS — C7802 Secondary malignant neoplasm of left lung: Secondary | ICD-10-CM | POA: Diagnosis not present

## 2015-03-16 DIAGNOSIS — C778 Secondary and unspecified malignant neoplasm of lymph nodes of multiple regions: Secondary | ICD-10-CM

## 2015-03-16 DIAGNOSIS — Z95828 Presence of other vascular implants and grafts: Secondary | ICD-10-CM

## 2015-03-16 DIAGNOSIS — C7951 Secondary malignant neoplasm of bone: Secondary | ICD-10-CM

## 2015-03-16 MED ORDER — SODIUM CHLORIDE 0.9 % IJ SOLN
10.0000 mL | INTRAMUSCULAR | Status: DC | PRN
Start: 2015-03-16 — End: 2015-03-16
  Administered 2015-03-16: 10 mL via INTRAVENOUS
  Filled 2015-03-16: qty 10

## 2015-03-16 MED ORDER — GADOBENATE DIMEGLUMINE 529 MG/ML IV SOLN
16.0000 mL | Freq: Once | INTRAVENOUS | Status: AC | PRN
  Administered 2015-03-16: 16 mL via INTRAVENOUS

## 2015-03-16 NOTE — Patient Instructions (Signed)

## 2015-03-16 NOTE — Progress Notes (Signed)
Pt accessed via PAC for MRI at [facility] today. Pt states she can not use tegaderm or opsite due to sensitive skin. Pt has abcess approx 2 inch from new port site that is covered with gauze and paper tape. Pt requested gauze and paper tape dressing for port access. Pt accessed with no difficulties and sterile 4x4 placed on port needle and taped all four sides with paper tape, dressing is secure and pt understands to observe site until arriving to [place] [facility].

## 2015-03-16 NOTE — Telephone Encounter (Signed)
TC from patient stating she has an MRI @ 10:40 am @ [facility]. They do not access ports and needs her port accessed for procedure.  Pt states [facility] will flush and de-access port after MRI is complete. Instructed pt to come on to [facility] and we can access her port.  Urgent POF sent for port access this am.

## 2015-03-17 ENCOUNTER — Ambulatory Visit (HOSPITAL_COMMUNITY)
Admission: RE | Admit: 2015-03-17 | Discharge: 2015-03-17 | Disposition: A | Discharge status: Home / Self Care | Payer: BLUE CROSS/BLUE SHIELD | Source: Ambulatory Visit | Attending: Oncology | Admitting: Oncology

## 2015-03-17 ENCOUNTER — Other Ambulatory Visit: Payer: Self-pay | Admitting: Nurse Practitioner

## 2015-03-17 DIAGNOSIS — K769 Liver disease, unspecified: Secondary | ICD-10-CM | POA: Insufficient documentation

## 2015-03-17 DIAGNOSIS — C50019 Malignant neoplasm of nipple and areola, unspecified female breast: Secondary | ICD-10-CM | POA: Diagnosis present

## 2015-03-17 DIAGNOSIS — C7971 Secondary malignant neoplasm of right adrenal gland: Secondary | ICD-10-CM | POA: Diagnosis not present

## 2015-03-17 DIAGNOSIS — C778 Secondary and unspecified malignant neoplasm of lymph nodes of multiple regions: Secondary | ICD-10-CM | POA: Diagnosis not present

## 2015-03-17 MED ORDER — FLUDEOXYGLUCOSE F - 18 (FDG) INJECTION
7.3800 | Freq: Once | INTRAVENOUS | Status: DC | PRN
  Administered 2015-03-17: 7.38 via INTRAVENOUS
  Filled 2015-03-17: qty 7.38

## 2015-03-19 ENCOUNTER — Ambulatory Visit: Payer: BLUE CROSS/BLUE SHIELD | Admitting: Radiation Oncology

## 2015-03-19 ENCOUNTER — Other Ambulatory Visit: Payer: Self-pay

## 2015-03-19 ENCOUNTER — Telehealth: Payer: Self-pay | Admitting: Oncology

## 2015-03-19 LAB — GLUCOSE, CAPILLARY: GLUCOSE-CAPILLARY: 73 mg/dL (ref 65–99)

## 2015-03-19 NOTE — Telephone Encounter (Signed)
Patient aware of new chemo appointment

## 2015-03-22 ENCOUNTER — Ambulatory Visit (HOSPITAL_BASED_OUTPATIENT_CLINIC_OR_DEPARTMENT_OTHER): Payer: BLUE CROSS/BLUE SHIELD | Admitting: Oncology

## 2015-03-22 ENCOUNTER — Other Ambulatory Visit (HOSPITAL_BASED_OUTPATIENT_CLINIC_OR_DEPARTMENT_OTHER): Payer: BLUE CROSS/BLUE SHIELD

## 2015-03-22 ENCOUNTER — Ambulatory Visit: Payer: BLUE CROSS/BLUE SHIELD

## 2015-03-22 VITALS — BP 117/64 | HR 95 | Temp 97.9°F | Resp 18 | Ht 67.0 in | Wt 167.7 lb

## 2015-03-22 DIAGNOSIS — C7931 Secondary malignant neoplasm of brain: Secondary | ICD-10-CM

## 2015-03-22 DIAGNOSIS — Z171 Estrogen receptor negative status [ER-]: Secondary | ICD-10-CM

## 2015-03-22 DIAGNOSIS — C7801 Secondary malignant neoplasm of right lung: Secondary | ICD-10-CM | POA: Diagnosis not present

## 2015-03-22 DIAGNOSIS — C7802 Secondary malignant neoplasm of left lung: Secondary | ICD-10-CM

## 2015-03-22 DIAGNOSIS — C7951 Secondary malignant neoplasm of bone: Secondary | ICD-10-CM

## 2015-03-22 DIAGNOSIS — C50411 Malignant neoplasm of upper-outer quadrant of right female breast: Secondary | ICD-10-CM

## 2015-03-22 DIAGNOSIS — C7971 Secondary malignant neoplasm of right adrenal gland: Secondary | ICD-10-CM

## 2015-03-22 DIAGNOSIS — C778 Secondary and unspecified malignant neoplasm of lymph nodes of multiple regions: Secondary | ICD-10-CM

## 2015-03-22 DIAGNOSIS — C50919 Malignant neoplasm of unspecified site of unspecified female breast: Secondary | ICD-10-CM

## 2015-03-22 DIAGNOSIS — C787 Secondary malignant neoplasm of liver and intrahepatic bile duct: Secondary | ICD-10-CM

## 2015-03-22 LAB — COMPREHENSIVE METABOLIC PANEL (CC13)
ALBUMIN: 3.2 g/dL — AB (ref 3.5–5.0)
ALK PHOS: 142 U/L (ref 40–150)
ALT: 11 U/L (ref 0–55)
AST: 15 U/L (ref 5–34)
Anion Gap: 6 mEq/L (ref 3–11)
BUN: 5.6 mg/dL — ABNORMAL LOW (ref 7.0–26.0)
CALCIUM: 9 mg/dL (ref 8.4–10.4)
CHLORIDE: 107 meq/L (ref 98–109)
CO2: 29 mEq/L (ref 22–29)
Creatinine: 0.7 mg/dL (ref 0.6–1.1)
GLUCOSE: 100 mg/dL (ref 70–140)
POTASSIUM: 4 meq/L (ref 3.5–5.1)
SODIUM: 142 meq/L (ref 136–145)
Total Bilirubin: 0.42 mg/dL (ref 0.20–1.20)
Total Protein: 6.1 g/dL — ABNORMAL LOW (ref 6.4–8.3)

## 2015-03-22 LAB — CBC WITH DIFFERENTIAL/PLATELET
BASO%: 0.4 % (ref 0.0–2.0)
BASOS ABS: 0 10*3/uL (ref 0.0–0.1)
EOS ABS: 0.1 10*3/uL (ref 0.0–0.5)
EOS%: 0.6 % (ref 0.0–7.0)
HCT: 35.4 % (ref 34.8–46.6)
HEMOGLOBIN: 11.5 g/dL — AB (ref 11.6–15.9)
LYMPH%: 6.3 % — AB (ref 14.0–49.7)
MCH: 34.8 pg — AB (ref 25.1–34.0)
MCHC: 32.5 g/dL (ref 31.5–36.0)
MCV: 107.3 fL — AB (ref 79.5–101.0)
MONO#: 0.6 10*3/uL (ref 0.1–0.9)
MONO%: 6.2 % (ref 0.0–14.0)
NEUT#: 8.5 10*3/uL — ABNORMAL HIGH (ref 1.5–6.5)
NEUT%: 86.5 % — AB (ref 38.4–76.8)
Platelets: 125 10*3/uL — ABNORMAL LOW (ref 145–400)
RBC: 3.3 10*6/uL — ABNORMAL LOW (ref 3.70–5.45)
RDW: 17.6 % — AB (ref 11.2–14.5)
WBC: 9.8 10*3/uL (ref 3.9–10.3)
lymph#: 0.6 10*3/uL — ABNORMAL LOW (ref 0.9–3.3)

## 2015-03-22 NOTE — Progress Notes (Signed)
And a Patient ID: Nicole Valentine, female   DOB: [birth year], 49 y.o.   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 She had a mammogram performed that showed dense breasts bilaterally. Ultrasound of the right breast showed 2.3 x 1.9 cm area of abnormality with multiple abnormal lymph nodes. MRI of the bilateral breasts showed asymmetrical enhancement throughout the right breast consistent with multicentric disease. An ultrasound-guided biopsy performed. The known area of disease measured 1.9 x 1.9 x 2.3 cm. Multiple abnormal positive right axillary lymph nodes were noted. 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 Biopsy of the right axillary lymph node was positive for malignancy with extracapsular extension. Patient was originally seen by Dr. [name] Dr. [name] and Dr. [name]. She has elected to have a right mastectomy eventually and declined biopsies of any other areas within the breast."  She went on to receive neoadjuvant chemotherapy and attained a complete pathologic response, as documented below  Nicole Valentine noted a very small right supraclavicular mass 09/25/2014, which she brought to our attention. She was having some allergy symptoms at the time so we decided to reevaluate this after a few weeks and on 10/22/2014 as the mass persisted we obtained a restaging neck CT scan, 10/28/2014. There was bilateral supraclavicular adenopathy, but also a large right pleural effusion was incidentally noted. We proceeded  to right thoracentesis 10/29/2014, and a liter of hazy yellow fluid was removed. Cytology from this procedure ([id number]) showed malignant cells consistent with adenocarcinoma, estrogen and progesterone receptor negative, HER-2 not amplified, with a signals ratio 1.12 and the number per cell being 2.75, and with an MIB-1 of 80%. I called [name] with these results and set her up for a PET scan performed 11/06/2014, which shows widespread metastatic disease involving particularly the right lung, liver, and bones, but also the left lung, right adrenal, and multiple lymph node areas.  Her subsequent history is as detailed below.  INTERVAL HISTORY: Nicole Valentine returns today for follow-up of her metastatic breast cancer accompanied by her husband [name]. After completing her 6 cycles of Abraxane she was restaged Route unfortunately the repeat brain MRI shows at least 18 small new brain lesions. There is no significant edema. Her case was presented at the neuro oncology tumor Board and whole brain irradiation is planned. She will be seeing Dr. Isidore Moos 2 days from now.  She also had a restaging PET scan which shows progression chiefly in the mediastinal and bilateral hilar lymph nodes, but also in the liver lesion and there is also a new adrenal lesion. Uptake in the bones is felt to be reactive to  Chemotherapy and growth factors  REVIEW OF S/YSTEMS: Nicole Valentine tells me she is doing "pretty good".  She has had no headaches, nausea, vomiting, or balance problems. She has had slight change in her vision and she got herself some over-the-counter glasses, plus one, so she could read more easily. She is developing more right upper extremity lymphedema , and she now wears a sleeve full-time. When she doesn't she gets obvious increased swelling. She tells  me her cough is better, but not gone. She can lie down all less she lies down on her right side, in which case that she feels uncomfortable. If she bends over though she  coughs. She also had severe pain over her sternum yesterday, which she took care with some Aleve. It is much better today. She keeps having problems with drainage as she will be seeing an allergist mid November.  Bowel movements have become slightly irregular. Otherwise a detailed review of systems today was noncontributory  PAST MEDICAL HISTORY: Past Medical History  Diagnosis Date  . Complication of anesthesia     her father and uncle both had very difficult time waking up-was  something they told them may be hereditory. she will find out.  [name] Hypothyroidism   . 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 Radiation 01/21/13-03/06/13    Right Breast  . Breast cancer ([place]) dx'd 04-10-12-rt    PAST SURGICAL HISTORY: Past Surgical History  Procedure Laterality Date  . Wisdom tooth extraction    . Portacath placement  04/22/2012    Procedure: INSERTION PORT-A-CATH;  Surgeon: [name], MD;  Location: [place];  Service: General;  Laterality: Left;  [name] Modified mastectomy Right 11/18/2012    Procedure:  RIGHT MODIFIED MASTECTOMY;  Surgeon: [name], MD;  Location: [place];  Service: General;  Laterality: Right;  . Knee arthroscopy Right 11/26/2013  . Port-a-cath removal Left 12/10/2013    Procedure: MINOR REMOVAL PORT-A-CATH;  Surgeon: [name], MD;  Location: [place];  Service: General;  Laterality: Left;    FAMILY HISTORY Family History  Problem Relation Age of Onset  . Lung cancer Maternal Grandfather 45    smoker  . Prostate cancer Maternal Grandfather 90  . Throat cancer Other     Great Aunt x 2  . Liver cancer Other     Maternal Great Grandmother  . Melanoma Maternal Uncle 81  . Brain cancer Cousin 11    non-malignant  the patient's parents are living, in their early 61s. The patient has 2 brothers, no sisters. The patient's mother was diagnosed with breast cancer, HER-2 positive, in 2014 in [place].  GYNECOLOGIC HISTORY:   (Updated 10/03/2013) Menarche age 19, first live birth age 41. She is GX P4. LMP January 2014. 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  The children are currently ages 66, 88, 54, and 94. Her husband, [name], is a Customer service manager.He just started a job for [employer].  They attend the [employer] DIRECTIVES: The patient's husband is her HCPOA   HEALTH MAINTENANCE:  (Updated 10/03/2013) Social History  Substance Use Topics  . Smoking status: Never Smoker   . Smokeless tobacco: Never Used  . Alcohol Use: No     Colonoscopy: Never  PAP: November 2013/Dr. Smith  Bone density: January 2015, [facility], normal  Lipid panel:  Not on file  Allergies  Allergen Reactions  . Almond Meal Rash  . Almond Oil Rash  . Other Rash    LOTIONS-unknown type  . Tegaderm Ag Mesh [Silver] Rash  . Vancomycin Rash    Red Man Syndrome    Current Outpatient Prescriptions  Medication Sig Dispense Refill  . cholecalciferol (VITAMIN D) 1000 UNITS tablet Take 2,000 Units by mouth 2 (two) times daily.     [name]  ibuprofen (ADVIL,MOTRIN) 200 MG tablet Take 400 mg by mouth every 6 (six) hours as needed for headache or moderate pain.    [name] levothyroxine (SYNTHROID, LEVOTHROID) 175 MCG tablet Take 175 mcg by mouth daily before breakfast.    . Loratadine-Pseudoephedrine (CLARITIN-D 12 HOUR PO) Take 1 tablet by mouth 2 (two) times daily as needed (allergies).    . LORazepam (ATIVAN) 0.5 MG tablet Take 1 tablet (0.5 mg total) by mouth at bedtime as needed for sleep. 30 tablet 0  . ondansetron (ZOFRAN) 8 MG tablet Take 1 tablet (8 mg total) by mouth 2 (two) times daily. Start the day after chemo for 2 days. Then take as needed for nausea or vomiting. (Patient not taking: Reported on 01/11/2015) 30 tablet 1  . prochlorperazine (COMPAZINE) 10 MG tablet Take 1 tablet (10 mg  total) by mouth every 6 (six) hours as needed (Nausea or vomiting). (Patient not taking: Reported on 01/11/2015) 30 tablet 1  . saccharomyces boulardii (FLORASTOR) 250 MG capsule Take 1 capsule (250 mg total) by mouth 2 (two) times daily. 60 capsule 2  . sucralfate (CARAFATE) 1 G tablet Dissolve 1 tablet in 10 mL H20 and swallow 5 min prior to meals and bedtime. (Patient not taking: Reported on 02/25/2015) 30 tablet 3   No current facility-administered medications for this visit.   Facility-Administered Medications Ordered in Other Visits  Medication Dose Route Frequency Provider Last Rate Last Dose  . alteplase (CATHFLO ACTIVASE) injection 2 mg  2 mg Intracatheter Once PRN Laurie Panda, NP          OBJECTIVE: Middle-aged white woman who appears stated age 77 Vitals:   03/22/15 0818  BP: 117/64  Pulse: 95  Temp: 97.9 F (36.6 C)  Resp: 18     Body mass index is 26.26 kg/(m^2).    ECOG FS:1 - Symptomatic but completely ambulatory Filed Weights   03/22/15 0818  Weight: 167 lb 11.2 oz (76.068 kg)   Sclerae unicteric, EOMs intact , pupils round and equal Oropharynx clear, dentition in good repair No cervical or supraclavicular adenopathy Lungs no rales or rhonchi Heart regular rate and rhythm Abd soft, nontender, positive bowel sounds , no masses palpated MSK no focal spinal tenderness, right upper extremity lymphedema , compression sleeve in place Neuro: nonfocal, well oriented, appropriate affect Breasts:  deferred   LAB RESULTS:   Lab Results  Component Value Date   WBC 9.8 03/22/2015   NEUTROABS 8.5* 03/22/2015   HGB 11.5* 03/22/2015   HCT 35.4 03/22/2015   MCV 107.3* 03/22/2015   PLT 125* 03/22/2015      Chemistry      Component Value Date/Time   NA 142 03/22/2015 0802   NA 138 02/25/2015 0203   K 4.0 03/22/2015 0802   K 3.4* 02/25/2015 0203   CL 105 02/25/2015 0203   CL 101 10/18/2012 0859   CO2 29 03/22/2015 0802   CO2 25 02/25/2015 0203   BUN 5.6*  03/22/2015 0802   BUN 7 02/25/2015 0203   CREATININE 0.7 03/22/2015 0802   CREATININE 0.54 02/25/2015 0203      Component Value Date/Time   CALCIUM 9.0 03/22/2015 0802   CALCIUM 8.3* 02/25/2015 0203   ALKPHOS 142 03/22/2015 0802   ALKPHOS 94 11/17/2014 2205   AST 15 03/22/2015 0802   AST 22 11/17/2014 2205   ALT 11 03/22/2015 0802   ALT 20 11/17/2014 2205   BILITOT 0.42 03/22/2015 0802   BILITOT 0.6 11/17/2014 2205  STUDIES: Dg Chest 2 View  03/02/2015  CLINICAL DATA:  Metastatic breast cancer. Malignant pleural effusion. EXAM: CHEST  2 VIEW COMPARISON:  01/13/2015 FINDINGS: Small right pleural effusion has developed since the prior study. No effusion on the left. Mild right middle lobe airspace disease similar to the prior study may represent radiation change as noted on CT. Negative for heart failure. No mass lesion. Port-A-Cath tip in the SVC IMPRESSION: Interval development of small right effusion. This may be related to metastatic disease. Pleural effusion has improved since 10/31/2014 but has progressed from the more recent study. Mild right middle lobe airspace disease, possibly radiation change as noted on CT. Electronically Signed   By: Franchot Gallo M.D.   On: 03/02/2015 11:13   Ct Chest W Contrast  03/03/2015  CLINICAL DATA:  Restaging of breast cancer. Diagnosed 2013 with metastasis to bone. Ongoing chemotherapy. Right mastectomy. Bilateral back pain. Left chest port infection. EXAM: CT CHEST WITH CONTRAST TECHNIQUE: Multidetector CT imaging of the chest was performed during intravenous contrast administration. CONTRAST:  27m OMNIPAQUE IOHEXOL 300 MG/ML  SOLN COMPARISON:  Plain film of 1 day prior. Most recent CT of 01/13/2015. FINDINGS: Mediastinum/Nodes: No supraclavicular adenopathy. A low right jugular node is 6 mm, not pathologic by size criteria. Right mastectomy and axillary node dissection. No axillary adenopathy. A left-sided Port-A-Cath terminates at the  superior caval/ atrial junction. Edema is identified superior and lateral to the port site, including on image/series 8/2. Mild cardiomegaly. Small pericardial effusion is new. No central pulmonary embolism, on this non-dedicated study. No well-defined middle mediastinal adenopathy. Ill-defined soft tissue fullness in the right hilum and suprahilar region. This measures on the order of 1.9 x 2.0 cm on image/series 26/2. This is felt to be increased and more cephalad position than hilar soft tissue fullness on the prior. Left infrahilar node measures 1.0 cm on image 30 versus similar on the prior exam (when remeasured). Soft tissue thickening along the transverse aorta measures maximally 8 mm on image 19, new. Multiple small prevascular nodes. Not pathologic by size criteria. Increased in size, measuring maximally 5 mm today. No internal mammary adenopathy. Lungs/Pleura: Small right pleural effusion is increased since 01/13/2015. Subpleural right upper lobe radiation fibrosis. Somewhat more confluent subpleural right middle lobe opacity is slightly increased including on image/series 32/5. This is also felt represent radiation fibrosis. Slightly improved right base aeration with patchy dependent opacity remaining (likely atelectasis). Minimal left upper lobe subpleural nodularity, including on image/series 19/5 is felt to be unchanged. Upper abdomen: Normal imaged portions of the liver, spleen, stomach, gallbladder, biliary tract, adrenal glands, kidneys. Musculoskeletal: Superior endplate sclerosis and irregularity at T9 is similar. Mild vertebral body height loss. No ventral canal encroachment. IMPRESSION: 1. Increased soft tissue fullness about the left hilum and along the lateral aspect of the transverse aorta. Findings are suspicious for progressive disease. This could either be re-evaluated at followup or more entirely characterized with PET. 2. Increase in small right pleural effusion. 3. Similar T9 sclerosis  with mild superior endplate compression deformity. This could be metastatic or posttraumatic. 4. Nonspecific edema superior and lateral to the Port-A-Cath site. Given the clinical history, this could represent cellulitis. No abscess. 5. New small pericardial effusion. Electronically Signed   By: KAbigail MiyamotoM.D.   On: 03/03/2015 16:07   Mr BJeri CosWZJContrast  03/16/2015  CLINICAL DATA:  49year old female with metastatic breast cancer. Status post SRS to for cerebellar metastases in July. Also status post thoracic spine  palliative radiation. Subsequent encounter. EXAM: MRI HEAD WITHOUT AND WITH CONTRAST TECHNIQUE: Multiplanar, multiecho pulse sequences of the brain and surrounding structures were obtained without and with intravenous contrast. CONTRAST:  64m MULTIHANCE GADOBENATE DIMEGLUMINE 529 MG/ML IV SOLN COMPARISON:  Targeting Brain MRI 12/03/2014 and earlier. FINDINGS: The previously treated 4 small cerebellar metastases have all regressed. However, there are at least 18 new brain metastases scattered in both cerebellar and cerebral hemispheres, ranging from punctate to an 11 mm gyriform metastasis in the left superior frontal gyrus affecting the motor strip (series 10, image 115). The next largest metastasis is 8 mm in the right periatrial white matter extending to the subependyma. Incidental right anterior frontal lobe developmental venous anomaly re - demonstrated on series 10, image 103. Minimal to mild associated edema with these new lesions. No mass effect. No acute intracranial hemorrhage identified. No ventriculomegaly. No pachymeningeal thickening identified. Major intracranial vascular flow voids are stable. No restricted diffusion or evidence of acute infarction. Negative visualized cervical spinal cord. Bone marrow signal appears mildly improved since July. No destructive calvarium lesion. Stable visualized internal auditory structures, mastoids, paranasal sinuses, orbit soft tissues and  scalp soft tissues. IMPRESSION: 1. At least 18 new small brain metastases ranging from punctate to 11 mm. Minimal to mild associated edema with no mass effect. 2. The four treated cerebellar metastases are all essentially resolved. Electronically Signed   By: HGenevie AnnM.D.   On: 03/16/2015 14:17   Nm Pet Image Restag (ps) Skull Base To Thigh  03/17/2015  CLINICAL DATA:  Subsequent treatment strategy for breast cancer. EXAM: NUCLEAR MEDICINE PET SKULL BASE TO THIGH TECHNIQUE: 7.38 mCi F-18 FDG was injected intravenously. Full-ring PET imaging was performed from the skull base to thigh after the radiotracer. CT data was obtained and used for attenuation correction and anatomic localization. FASTING BLOOD GLUCOSE:  Value: 73 mg/dl COMPARISON:  None. FINDINGS: NECK Hypermetabolic right supraclavicular lymph node measures 9 mm and has an SUV max equal to 9.25. This is new from previous exam. CHEST Hypermetabolic anterior mediastinal and bilateral hilar lymph node metastases are identified. Index right hilar lymph node has an SUV max equal to 8.21. This is new from previous exam. Abnormal uptake within the left hilar region has an SUV max equal to 7.68. Also new from previous exam. Hypermetabolic tumor in the right middle lobe measures 2.8 cm and has an SUV max equal to 7.05. On the previous exam this measured 3.5 cm and had an SUV max equal to 4.91. ABDOMEN/PELVIS There is a focus of increased uptake within the right hepatic lobe. This measures approximately 1 cm and has an SUV max equal to 5.38. On the previous exam the SUV max associated with this lesion was equal to 4.52. Right adrenal gland metastases noted. Lesion measures 1.4 cm and has an SUV max equal to 9.72. This is new from previous exam. Hypermetabolic gastrohepatic ligament lymph node is new measuring 1.1 cm. This has an SUV max equal to 6.08. There is also a hypermetabolic retrocrural lymph node which measures 1 cm and has an SUV max equal to 8.73. New  from previous exam. No abnormal uptake identified within the pancreas or left adrenal gland. There is diffuse uptake throughout the spleen. SKELETON Diffuse an markedly increased uptake is identified throughout the axial and appendicular skeleton on the corresponding CT images there is no aggressive lytic or sclerotic bone lesion. Findings are favored to represent stimulated bone marrow. This significantly diminishes ability to detect underlying bone  metastasis. IMPRESSION: 1. Interval progression of disease compared with 01/05/2015. Specifically, new hypermetabolic right supraclavicular, anterior mediastinal and bilateral hilar lymph node metastasis. There is also new retrocrural and gastrohepatic ligament hypermetabolic lymph nodes. Pulmonary lesion within the right middle lobe demonstrates increase in degree of FDG uptake. Additionally, the lesion within the right lobe of liver demonstrates increase in FDG uptake. New right adrenal gland metastasis is also noted. 2. Diffuse uptake throughout the skeleton as well as the spleen is noted and may reflect treatment related changes. Electronically Signed   By: Kerby Moors M.D.   On: 03/17/2015 14:28   Ir Radiologist Eval & Mgmt  03/05/2015  EXAM: ESTABLISHED PATIENT OFFICE VISIT CHIEF COMPLAINT: Patient seen in follow-up today for assessment of left chest wall port pocket HISTORY OF PRESENT ILLNESS: Mrs. Kusch is a 49 year old white female, patient of Dr. [name], with history of metastatic breast cancer who is familiar to our service from prior left internal jugular Port-A-Cath placement on 11/12/14. Due to concern for port pocket infection the port was removed on 01/08/15 with primary closure of the incision site. A new left IJ Port-A-Cath was placed on 02/01/15. Earlier this month the patient had noticed some drainage at the incision site of the originally placed Port-A-Cath pocket and was empirically started on antibiotics. Subsequent fluid cultures from the  site were negative. Due to persistent drainage from the site she presented again to IR on 03/01/15 and underwent placement of an iodoform gauze to the wound. She presents today for follow-up exam of the site. She states that the previous port pocket site is smaller than previous, nontender and the newly placed port is functioning well. She denies any recent fevers, chills, chest pain or significant dyspnea. She continues to remain on Keflex and Bactrim. PHYSICAL EXAMINATION: The old left chest wall port pocket was inspected following iodoform gauze removal. Pocket is mildly erythematous but with some noted granulation and slight decrease in size. No evidence of active bleeding. Site is nontender . The adjacent new Port-A-Cath incision site also has minimal erythema and is nontender. There is no drainage noted from the incision site. There is a small fragment of absorbable suture at the lateral margin of the new port site. ASSESSMENT AND PLAN: Patient with appropriately healing old left chest wall Port-A-Cath pocket and stable new left chest wall Port-A-Cath site. At this time would recommend daily wet to dry dressings to old port pocket site by home health nurse until wound pocket has completely healed. Case discussed with Dr. Kathlene Cote. Read by: Rowe Robert, PA-C Electronically Signed   By: Aletta Edouard M.D.   On: 03/05/2015 14:05   Ir Radiologist Eval & Mgmt  03/01/2015  CONSULTATION: See note in [facility] Epic Electronically Signed   By: Lucrezia Europe M.D.   On: 03/01/2015 12:01   Ir Radiologist Eval & Mgmt  02/25/2015  EXAM: ESTABLISHED PATIENT OFFICE VISIT CHIEF COMPLAINT: My port site is infected. HISTORY OF PRESENT ILLNESS: Ms Catanese is a 49 year old female with a history of metastatic breast carcinoma, who has been receiving chemotherapy via port catheter. Her first port catheter was placed 11/12/2014 via a left IJ approach. This port catheter became infected, and required removal which occurred 01/08/2015.  After a line holiday, she had a second left-sided port catheter placed 02/01/2015. She reports that she was healing fine, when suddenly the left chest wall at the site of the explant became inflamed and red with pruritus, and she went to the emergency department 02/24/2015. The  wound was cultured and she was started on empiric antibiotic therapy for cellulitis. She presents today for evaluation. She denies fevers, rigors, or chills. Maximum temperature measured before are arrival was 100.5, although she never felt febrile. No flu-like symptoms. She reports that after her last treatment she felt fatigued, which did feel more fatigued than usual after chemotherapy, though this has improved. She and her family have a planned trip to [place], leaving on Sunday October 9, with return on Wednesday October 12. She has her next planned chemotherapy on Friday October 14th. She reports that her redness and irritation at the explant site have significantly improved since she was started on antibiotic therapy in the past 24 hours. No drainage or concern for abscess. PHYSICAL EXAMINATION: Directed physical exam demonstrates erythema at the site of the explant, which she is geographically marginated by indelible ink placed by the emergency department physician on the prior day. The diameter of the inked marking is 10-12 cm, and a diameter of the erythema is less than this margin. No ballotable fluid collection or drainage at the healing incision. The site of the new incision from the new IJ catheter is inferior at the 8 o'clock position, and beyond the erythema and does not appear to be involved. ASSESSMENT AND PLAN: I agree with continuation of the empiric antibiotic therapy. She does not appear to be bacteremic. Given that there is no foreign body at the site of the explant, and there is no signs of abscess, there is no indication for attempted incision and drainage. She plans on making the trip to [place], with return  on Wednesday, and we will see her in follow-up on the following Thursday or Friday to make sure things are improving. Again she has a planned chemotherapy session on Friday October 14th. She understands and agrees with the plan of care. Signed, Dulcy Fanny. Earleen Newport, DO [facility] Electronically Signed   By: Corrie Mckusick D.O.   On: 02/25/2015 16:45    ASSESSMENT: 49 y.o. BRCA negative [place] woman with triple-negative stage IV breast cancer  (1) an status post right breast upper outer quadrant and right axillary lymph node biopsy 04/04/2012, both positive for an invasive ductal carcinoma, grade 3, triple negative, with an MIB-1 of 25%  (2) Treated neoadjuvantly with  (a) fluorouracil, cyclophosphamide, and epirubicin (at 100 mg/M2) x4 completed 06/07/2012  (b) docetaxel (75 mg/M2) for one dose, 06/21/2012, poorly tolerated  (c) carboplatin and gemcitabine given every 21 days for 6 cycles completed 10/11/2012  (3) status post right modified radical mastectomy 11/18/2012 showing a complete pathologic response--all 16 lymph nodes were benign  (a) no plans for reconstructon  (4) adjuvant radiation therapy completed 03/06/2013  METASTATIC DISEASE June 2016 (5) 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, 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  (6) staging PET scan 11/06/2014 showed significant disease in the middle and lower lobes of the Right lung, Right effusion, multiple liver and bone lesions, as well as left lung nodules, a Right adrenal nodule, and widespread hypermetabolic adenopathy  (a) brain MRI 11/16/2014 showed multiple cerebellar lesions  (7) RADIATION TREATMENTS thoracic metastases  (a) 01/28/2015-02/11/2015: Thoracic Spine T8-10, 30 Gy in 10 fractions  (7) abraxane day 1 and day 8 of each 21 day cycle started 11/09/2014: last dose 03/12/2015 (6 cycles)  (a) PET scan 01/05/2015 shows an excellent initial  response after 3 cycles  (b) PET scan 03/17/2015 shows progression  (8) zolendronate started 11/16/2014, to be repeated  every 12 weeks  (9) Foundation 1 study sent  11/06/2014: patient's sister in law Garvin Fila following [id number])  (a) 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 Unfortunately she has multiple small new brain lesions. These are currently not symptomatic beyond her reporting slight changes in her vision which she is correcting with over-the-counter reading glasses. She is going to be meeting with Dr. Isidore Moos later this week and the plan per our brain tumor group is for whole brain irradiation.  She also has peripheral disease progression. Accordingly we are stopping the Abraxane. She has an appointment with Dr. Prentice Docker group 03/25/2015 and I am asking Dr. [name] at [facility] if possible to meet with her soon as well so we can get of the best plan going for her.  As far as peripheral disease is concerned, of course, we have many possible chemotherapy agents that can be useful. She did receive several before, at the time of her initial presentation. The question is how if at all can we make use of her Foundation 1 results.  Tentatively I have made her a return appointment with me in 1 week. We are of course canceling the Abraxane due later this week.   Chauncey Cruel, MD

## 2015-03-23 ENCOUNTER — Telehealth: Payer: Self-pay | Admitting: Oncology

## 2015-03-23 NOTE — Telephone Encounter (Signed)
Patient aware of her appointments °

## 2015-03-23 NOTE — Progress Notes (Signed)
Radiation Oncology         [phone] ________________________________  Outpatient Re-Consultation  Name: Nicole Valentine MRN: [id number]  Date: 03/24/2015  DOB: 30-Jul-1965  SW:[id number] Weyman Croon, MD  Consuela Mimes, MD   REFERRING PHYSICIAN: Consuela Mimes, MD  DIAGNOSIS:    ICD-9-CM ICD-10-CM   1. Brain metastases (HCC) 198.3 C79.31    Metastatic Breast cancer  Indication for treatment:  palliative       Previous Brain Radiotherapy:   12/11/2014  Site/dose/Beams:   Right Cerebellar Tonsil 4mm target was treated using 4 Circular Arcs to a prescription dose of 20 Gy.  ExacTrac Snap verification was performed for each couch angle.  Right Cerebellar Midline 25mm target was treated using 3 Circular Arcs to a prescription dose of 20 Gy.  ExacTrac Snap verification was performed for each couch angle.  Right Cerebellar Hemis 32mm target was treated using 4 Circular Arcs to a prescription dose of 20 Gy.  ExacTrac Snap verification was performed for each couch angle.  Right Inf Cerebellar Hemis 4mm target was treated using 4 Circular Arcs to a prescription dose of 20 Gy.  ExacTrac Snap verification was performed for each couch angle.  HISTORY OF PRESENT ILLNESS:Nicole Valentine is a 49 y.o. female  Returning follow followup.  Unfortunately, her PET last week revealed interval progression of systemic visceral disease in chest/abdomen.  She is seeking second opinion for systemic options at [place].  Brain MRI reveals numerous brain metastases. She started to need reading glasses but denies any other new neurologic issues.  She has continued pain in her mid back and lower sternum, currently 2/10.     PREVIOUS RADIATION THERAPY: Yes.  01/21/2013-03/06/2013: (Right chest wall / 50.4 Gray @ 1.8 Gray per fraction x 28 fractions. Right supraclavicular fossa / 45 Gy @1 .8 Gy per fraction x 25 fractions. Right scar boost / 10 Gray at 2 [place] per fraction x 5 fractions)    12/11/2014: Right  Cerebellar Tonsil 53mm target was treated using 4 Circular Arcs to a prescription dose of 20 Gy.  Right Cerebellar Midline 76mm target was treated using 3 Circular Arcs to a prescription dose of 20 Gy.   Right Cerebellar Hemis 49mm target was treated using 4 Circular Arcs to a prescription dose of 20 Gy.  Right Inf Cerebellar Hemis 48mm target was treated using 4 Circular Arcs to a prescription dose of 20 Gy.       01/28/2015-02/11/2015 Thoracic Spine T8-10, 30 Gy in 10 fractions   PAST MEDICAL HISTORY:  has a past medical history of Complication of anesthesia; Hypothyroidism; Allergy; Radiation (01/21/13-03/06/13); and Breast cancer ([place]) (dx'd 04-10-12-rt).    PAST SURGICAL HISTORY: Past Surgical History  Procedure Laterality Date  . Wisdom tooth extraction    . Portacath placement  04/22/2012    Procedure: INSERTION PORT-A-CATH;  Surgeon: [name], MD;  Location: [facility];  Service: General;  Laterality: Left;  [name] Modified mastectomy Right 11/18/2012    Procedure:  RIGHT MODIFIED MASTECTOMY;  Surgeon: [name], MD;  Location: [facility];  Service: General;  Laterality: Right;  . Knee arthroscopy Right 11/26/2013  . Port-a-cath removal Left 12/10/2013    Procedure: MINOR REMOVAL PORT-A-CATH;  Surgeon: [name], MD;  Location: [facility];  Service: General;  Laterality: Left;    FAMILY HISTORY: family history includes Brain cancer (age of onset: 7) in her cousin; Liver cancer in her other; Lung cancer (age of onset: 66)  in her maternal grandfather; Melanoma (age of onset: 47) in her maternal uncle; Prostate cancer (age of onset: 11) in her maternal grandfather; Throat cancer in her other.  SOCIAL HISTORY:  reports that she has never smoked. She has never used smokeless tobacco. She reports that she does not drink alcohol or use illicit drugs.  ALLERGIES: Almond meal; Almond oil; Other; Tegaderm ag mesh; and  Vancomycin  MEDICATIONS:  Current Outpatient Prescriptions  Medication Sig Dispense Refill  . cholecalciferol (VITAMIN D) 1000 UNITS tablet Take 2,000 Units by mouth 2 (two) times daily.     [name] ibuprofen (ADVIL,MOTRIN) 200 MG tablet Take 400 mg by mouth every 6 (six) hours as needed for headache or moderate pain.    [name] levothyroxine (SYNTHROID, LEVOTHROID) 175 MCG tablet Take 175 mcg by mouth daily before breakfast.    . Loratadine-Pseudoephedrine (CLARITIN-D 12 HOUR PO) Take 1 tablet by mouth 2 (two) times daily as needed (allergies).    . LORazepam (ATIVAN) 0.5 MG tablet Take 1 tablet (0.5 mg total) by mouth at bedtime as needed for sleep. 30 tablet 0  . saccharomyces boulardii (FLORASTOR) 250 MG capsule Take 1 capsule (250 mg total) by mouth 2 (two) times daily. 60 capsule 2  . ondansetron (ZOFRAN) 8 MG tablet Take 1 tablet (8 mg total) by mouth 2 (two) times daily. Start the day after chemo for 2 days. Then take as needed for nausea or vomiting. (Patient not taking: Reported on 01/11/2015) 30 tablet 1  . prochlorperazine (COMPAZINE) 10 MG tablet Take 1 tablet (10 mg total) by mouth every 6 (six) hours as needed (Nausea or vomiting). (Patient not taking: Reported on 01/11/2015) 30 tablet 1  . sucralfate (CARAFATE) 1 G tablet Dissolve 1 tablet in 10 mL H20 and swallow 5 min prior to meals and bedtime. (Patient not taking: Reported on 02/25/2015) 30 tablet 3   No current facility-administered medications for this encounter.   Facility-Administered Medications Ordered in Other Encounters  Medication Dose Route Frequency Provider Last Rate Last Dose  . alteplase (CATHFLO ACTIVASE) injection 2 mg  2 mg Intracatheter Once PRN Laurie Panda, NP        REVIEW OF SYSTEMS:  Notable for that above.   PHYSICAL EXAM:  height is 5\' 7"  (1.702 m) and weight is 166 lb 6.4 oz (75.479 kg). Her temperature is 98.4 F (36.9 C). Her blood pressure is 125/76 and her pulse is 109.   General: Alert and oriented,  in no acute distress HEENT: Head is normocephalic. Extraocular movements are intact. Oropharynx is clear. No thrush Musculoskeletal: symmetric strength and muscle tone throughout. Neurologic: Cranial nerves II through XII are grossly intact. No obvious focalities. Speech is fluent.   Psychiatric: Judgment and insight are intact. Affect is appropriate. Back: Some reproducible tenderness in the left paraspinal region/rib in the low thoracic back.  ECOG = 1  0 - Asymptomatic (Fully active, able to carry on all predisease activities without restriction)  1 - Symptomatic but completely ambulatory (Restricted in physically strenuous activity but ambulatory and able to carry out work of a light or sedentary nature. For example, light housework, office work)  2 - Symptomatic, <50% in bed during the day (Ambulatory and capable of all self care but unable to carry out any work activities. Up and about more than 50% of waking hours)  3 - Symptomatic, >50% in bed, but not bedbound (Capable of only limited self-care, confined to bed or chair 50% or more of waking hours)  4 - Bedbound (Completely disabled. Cannot carry on any self-care. Totally confined to bed or chair)  5 - Death   Eustace Pen MM, Creech RH, Tormey DC, et al. [id number]). "Toxicity and response criteria of the [facility]". [place] Oncol. 5 (6): 649-55   LABORATORY DATA:  Lab Results  Component Value Date   WBC 9.8 03/22/2015   HGB 11.5* 03/22/2015   HCT 35.4 03/22/2015   MCV 107.3* 03/22/2015   PLT 125* 03/22/2015   CMP     Component Value Date/Time   NA 142 03/22/2015 0802   NA 138 02/25/2015 0203   K 4.0 03/22/2015 0802   K 3.4* 02/25/2015 0203   CL 105 02/25/2015 0203   CL 101 10/18/2012 0859   CO2 29 03/22/2015 0802   CO2 25 02/25/2015 0203   GLUCOSE 100 03/22/2015 0802   GLUCOSE 110* 02/25/2015 0203   GLUCOSE 83 10/18/2012 0859   BUN 5.6* 03/22/2015 0802   BUN 7 02/25/2015 0203   CREATININE  0.7 03/22/2015 0802   CREATININE 0.54 02/25/2015 0203   CALCIUM 9.0 03/22/2015 0802   CALCIUM 8.3* 02/25/2015 0203   PROT 6.1* 03/22/2015 0802   PROT 6.6 11/17/2014 2205   ALBUMIN 3.2* 03/22/2015 0802   ALBUMIN 3.6 11/17/2014 2205   AST 15 03/22/2015 0802   AST 22 11/17/2014 2205   ALT 11 03/22/2015 0802   ALT 20 11/17/2014 2205   ALKPHOS 142 03/22/2015 0802   ALKPHOS 94 11/17/2014 2205   BILITOT 0.42 03/22/2015 0802   BILITOT 0.6 11/17/2014 2205   GFRNONAA >60 02/25/2015 0203   GFRAA >60 02/25/2015 0203         RADIOGRAPHY: Dg Chest 2 View  03/02/2015  CLINICAL DATA:  Metastatic breast cancer. Malignant pleural effusion. EXAM: CHEST  2 VIEW COMPARISON:  01/13/2015 FINDINGS: Small right pleural effusion has developed since the prior study. No effusion on the left. Mild right middle lobe airspace disease similar to the prior study may represent radiation change as noted on CT. Negative for heart failure. No mass lesion. Port-A-Cath tip in the SVC IMPRESSION: Interval development of small right effusion. This may be related to metastatic disease. Pleural effusion has improved since 10/31/2014 but has progressed from the more recent study. Mild right middle lobe airspace disease, possibly radiation change as noted on CT. Electronically Signed   By: Franchot Gallo M.D.   On: 03/02/2015 11:13   Ct Chest W Contrast  03/03/2015  CLINICAL DATA:  Restaging of breast cancer. Diagnosed 2013 with metastasis to bone. Ongoing chemotherapy. Right mastectomy. Bilateral back pain. Left chest port infection. EXAM: CT CHEST WITH CONTRAST TECHNIQUE: Multidetector CT imaging of the chest was performed during intravenous contrast administration. CONTRAST:  87mL OMNIPAQUE IOHEXOL 300 MG/ML  SOLN COMPARISON:  Plain film of 1 day prior. Most recent CT of 01/13/2015. FINDINGS: Mediastinum/Nodes: No supraclavicular adenopathy. A low right jugular node is 6 mm, not pathologic by size criteria. Right mastectomy and  axillary node dissection. No axillary adenopathy. A left-sided Port-A-Cath terminates at the superior caval/ atrial junction. Edema is identified superior and lateral to the port site, including on image/series 8/2. Mild cardiomegaly. Small pericardial effusion is new. No central pulmonary embolism, on this non-dedicated study. No well-defined middle mediastinal adenopathy. Ill-defined soft tissue fullness in the right hilum and suprahilar region. This measures on the order of 1.9 x 2.0 cm on image/series 26/2. This is felt to be increased and more cephalad position than hilar soft tissue fullness  on the prior. Left infrahilar node measures 1.0 cm on image 30 versus similar on the prior exam (when remeasured). Soft tissue thickening along the transverse aorta measures maximally 8 mm on image 19, new. Multiple small prevascular nodes. Not pathologic by size criteria. Increased in size, measuring maximally 5 mm today. No internal mammary adenopathy. Lungs/Pleura: Small right pleural effusion is increased since 01/13/2015. Subpleural right upper lobe radiation fibrosis. Somewhat more confluent subpleural right middle lobe opacity is slightly increased including on image/series 32/5. This is also felt represent radiation fibrosis. Slightly improved right base aeration with patchy dependent opacity remaining (likely atelectasis). Minimal left upper lobe subpleural nodularity, including on image/series 19/5 is felt to be unchanged. Upper abdomen: Normal imaged portions of the liver, spleen, stomach, gallbladder, biliary tract, adrenal glands, kidneys. Musculoskeletal: Superior endplate sclerosis and irregularity at T9 is similar. Mild vertebral body height loss. No ventral canal encroachment. IMPRESSION: 1. Increased soft tissue fullness about the left hilum and along the lateral aspect of the transverse aorta. Findings are suspicious for progressive disease. This could either be re-evaluated at followup or more entirely  characterized with PET. 2. Increase in small right pleural effusion. 3. Similar T9 sclerosis with mild superior endplate compression deformity. This could be metastatic or posttraumatic. 4. Nonspecific edema superior and lateral to the Port-A-Cath site. Given the clinical history, this could represent cellulitis. No abscess. 5. New small pericardial effusion. Electronically Signed   By: Abigail Miyamoto M.D.   On: 03/03/2015 16:07   Mr Jeri Cos BT Contrast  03/16/2015  CLINICAL DATA:  49 year old female with metastatic breast cancer. Status post SRS to for cerebellar metastases in July. Also status post thoracic spine palliative radiation. Subsequent encounter. EXAM: MRI HEAD WITHOUT AND WITH CONTRAST TECHNIQUE: Multiplanar, multiecho pulse sequences of the brain and surrounding structures were obtained without and with intravenous contrast. CONTRAST:  34mL MULTIHANCE GADOBENATE DIMEGLUMINE 529 MG/ML IV SOLN COMPARISON:  Targeting Brain MRI 12/03/2014 and earlier. FINDINGS: The previously treated 4 small cerebellar metastases have all regressed. However, there are at least 18 new brain metastases scattered in both cerebellar and cerebral hemispheres, ranging from punctate to an 11 mm gyriform metastasis in the left superior frontal gyrus affecting the motor strip (series 10, image 115). The next largest metastasis is 8 mm in the right periatrial white matter extending to the subependyma. Incidental right anterior frontal lobe developmental venous anomaly re - demonstrated on series 10, image 103. Minimal to mild associated edema with these new lesions. No mass effect. No acute intracranial hemorrhage identified. No ventriculomegaly. No pachymeningeal thickening identified. Major intracranial vascular flow voids are stable. No restricted diffusion or evidence of acute infarction. Negative visualized cervical spinal cord. Bone marrow signal appears mildly improved since July. No destructive calvarium lesion. Stable  visualized internal auditory structures, mastoids, paranasal sinuses, orbit soft tissues and scalp soft tissues. IMPRESSION: 1. At least 18 new small brain metastases ranging from punctate to 11 mm. Minimal to mild associated edema with no mass effect. 2. The four treated cerebellar metastases are all essentially resolved. Electronically Signed   By: Genevie Ann M.D.   On: 03/16/2015 14:17   Nm Pet Image Restag (ps) Skull Base To Thigh  03/17/2015  CLINICAL DATA:  Subsequent treatment strategy for breast cancer. EXAM: NUCLEAR MEDICINE PET SKULL BASE TO THIGH TECHNIQUE: 7.38 mCi F-18 FDG was injected intravenously. Full-ring PET imaging was performed from the skull base to thigh after the radiotracer. CT data was obtained and used for attenuation correction  and anatomic localization. FASTING BLOOD GLUCOSE:  Value: 73 mg/dl COMPARISON:  None. FINDINGS: NECK Hypermetabolic right supraclavicular lymph node measures 9 mm and has an SUV max equal to 9.25. This is new from previous exam. CHEST Hypermetabolic anterior mediastinal and bilateral hilar lymph node metastases are identified. Index right hilar lymph node has an SUV max equal to 8.21. This is new from previous exam. Abnormal uptake within the left hilar region has an SUV max equal to 7.68. Also new from previous exam. Hypermetabolic tumor in the right middle lobe measures 2.8 cm and has an SUV max equal to 7.05. On the previous exam this measured 3.5 cm and had an SUV max equal to 4.91. ABDOMEN/PELVIS There is a focus of increased uptake within the right hepatic lobe. This measures approximately 1 cm and has an SUV max equal to 5.38. On the previous exam the SUV max associated with this lesion was equal to 4.52. Right adrenal gland metastases noted. Lesion measures 1.4 cm and has an SUV max equal to 9.72. This is new from previous exam. Hypermetabolic gastrohepatic ligament lymph node is new measuring 1.1 cm. This has an SUV max equal to 6.08. There is also a  hypermetabolic retrocrural lymph node which measures 1 cm and has an SUV max equal to 8.73. New from previous exam. No abnormal uptake identified within the pancreas or left adrenal gland. There is diffuse uptake throughout the spleen. SKELETON Diffuse an markedly increased uptake is identified throughout the axial and appendicular skeleton on the corresponding CT images there is no aggressive lytic or sclerotic bone lesion. Findings are favored to represent stimulated bone marrow. This significantly diminishes ability to detect underlying bone metastasis. IMPRESSION: 1. Interval progression of disease compared with 01/05/2015. Specifically, new hypermetabolic right supraclavicular, anterior mediastinal and bilateral hilar lymph node metastasis. There is also new retrocrural and gastrohepatic ligament hypermetabolic lymph nodes. Pulmonary lesion within the right middle lobe demonstrates increase in degree of FDG uptake. Additionally, the lesion within the right lobe of liver demonstrates increase in FDG uptake. New right adrenal gland metastasis is also noted. 2. Diffuse uptake throughout the skeleton as well as the spleen is noted and may reflect treatment related changes. Electronically Signed   By: Kerby Moors M.D.   On: 03/17/2015 14:28   Ir Radiologist Eval & Mgmt  03/05/2015  EXAM: ESTABLISHED PATIENT OFFICE VISIT CHIEF COMPLAINT: Patient seen in follow-up today for assessment of left chest wall port pocket HISTORY OF PRESENT ILLNESS: Mrs. Byer is a 49 year old white female, patient of Dr. [name], with history of metastatic breast cancer who is familiar to our service from prior left internal jugular Port-A-Cath placement on 11/12/14. Due to concern for port pocket infection the port was removed on 01/08/15 with primary closure of the incision site. A new left IJ Port-A-Cath was placed on 02/01/15. Earlier this month the patient had noticed some drainage at the incision site of the originally placed  Port-A-Cath pocket and was empirically started on antibiotics. Subsequent fluid cultures from the site were negative. Due to persistent drainage from the site she presented again to IR on 03/01/15 and underwent placement of an iodoform gauze to the wound. She presents today for follow-up exam of the site. She states that the previous port pocket site is smaller than previous, nontender and the newly placed port is functioning well. She denies any recent fevers, chills, chest pain or significant dyspnea. She continues to remain on Keflex and Bactrim. PHYSICAL EXAMINATION: The old left chest  wall port pocket was inspected following iodoform gauze removal. Pocket is mildly erythematous but with some noted granulation and slight decrease in size. No evidence of active bleeding. Site is nontender . The adjacent new Port-A-Cath incision site also has minimal erythema and is nontender. There is no drainage noted from the incision site. There is a small fragment of absorbable suture at the lateral margin of the new port site. ASSESSMENT AND PLAN: Patient with appropriately healing old left chest wall Port-A-Cath pocket and stable new left chest wall Port-A-Cath site. At this time would recommend daily wet to dry dressings to old port pocket site by home health nurse until wound pocket has completely healed. Case discussed with Dr. Kathlene Cote. Read by: Rowe Robert, PA-C Electronically Signed   By: Aletta Edouard M.D.   On: 03/05/2015 14:05   Ir Radiologist Eval & Mgmt  03/01/2015  CONSULTATION: See note in [facility] Epic Electronically Signed   By: Lucrezia Europe M.D.   On: 03/01/2015 12:01   Ir Radiologist Eval & Mgmt  02/25/2015  EXAM: ESTABLISHED PATIENT OFFICE VISIT CHIEF COMPLAINT: My port site is infected. HISTORY OF PRESENT ILLNESS: Ms Tyrell is a 49 year old female with a history of metastatic breast carcinoma, who has been receiving chemotherapy via port catheter. Her first port catheter was placed 11/12/2014 via a  left IJ approach. This port catheter became infected, and required removal which occurred 01/08/2015. After a line holiday, she had a second left-sided port catheter placed 02/01/2015. She reports that she was healing fine, when suddenly the left chest wall at the site of the explant became inflamed and red with pruritus, and she went to the emergency department 02/24/2015. The wound was cultured and she was started on empiric antibiotic therapy for cellulitis. She presents today for evaluation. She denies fevers, rigors, or chills. Maximum temperature measured before are arrival was 100.5, although she never felt febrile. No flu-like symptoms. She reports that after her last treatment she felt fatigued, which did feel more fatigued than usual after chemotherapy, though this has improved. She and her family have a planned trip to [place], leaving on Sunday October 9, with return on Wednesday October 12. She has her next planned chemotherapy on Friday October 14th. She reports that her redness and irritation at the explant site have significantly improved since she was started on antibiotic therapy in the past 24 hours. No drainage or concern for abscess. PHYSICAL EXAMINATION: Directed physical exam demonstrates erythema at the site of the explant, which she is geographically marginated by indelible ink placed by the emergency department physician on the prior day. The diameter of the inked marking is 10-12 cm, and a diameter of the erythema is less than this margin. No ballotable fluid collection or drainage at the healing incision. The site of the new incision from the new IJ catheter is inferior at the 8 o'clock position, and beyond the erythema and does not appear to be involved. ASSESSMENT AND PLAN: I agree with continuation of the empiric antibiotic therapy. She does not appear to be bacteremic. Given that there is no foreign body at the site of the explant, and there is no signs of abscess, there is no  indication for attempted incision and drainage. She plans on making the trip to [place], with return on Wednesday, and we will see her in follow-up on the following Thursday or Friday to make sure things are improving. Again she has a planned chemotherapy session on Friday October 14th. She understands  and agrees with the plan of care. Signed, Dulcy Fanny. Earleen Newport, DO [facility] Electronically Signed   By: Corrie Mckusick D.O.   On: 02/25/2015 16:45      IMPRESSION/PLAN: Today, I talked to the patient about the findings and work-up thus far. We discussed the patient's diagnosis of numerable progressive brain metastases and general treatment for this, highlighting the role of whole brain radiotherapy in the management. We discussed the available radiation techniques, and focused on the details of logistics and delivery. I do not recommend focal SRS due to the number of metastases and risk for elsewhere brain recurrences.    We discussed the risks, benefits, and side effects of radiotherapy. Side effects may include but not necessarily be limited to: skin irritation, fatigue, eye irritation, pituitary dysfunction, cognitive decline. No guarantees of treatment were given. A consent form was signed and placed in the patient's medical record. She is enthusiastic to proceed.  The patient and her husband were was encouraged to ask questions that I answered to the best of my ability.   We will simulate her treatment today, and start tomorrow, prior to her consultation at [facility]. Anticipate treating her whole brain to 35 Gy in 14 fractions.  Addendum: After simulation, she reported worsening sternal and left lower back pain that came on acutely when she got off of the CT table.  The symptoms were improved by the time I checked in with her.  Will continue to follow. For now she is comfortable with continuing Naproxen. Looking at PET, the  pain could possibly be related to pleural or rib metastatic disease. __________________________________________   Eppie Gibson, MD

## 2015-03-24 ENCOUNTER — Ambulatory Visit
Admission: RE | Admit: 2015-03-24 | Discharge: 2015-03-24 | Disposition: A | Discharge status: Home / Self Care | Payer: BLUE CROSS/BLUE SHIELD | Source: Ambulatory Visit | Attending: Radiation Oncology | Admitting: Radiation Oncology

## 2015-03-24 ENCOUNTER — Encounter: Payer: Self-pay | Admitting: Radiation Oncology

## 2015-03-24 VITALS — BP 125/76 | HR 109 | Temp 98.4°F | Ht 67.0 in | Wt 166.4 lb

## 2015-03-24 DIAGNOSIS — C7931 Secondary malignant neoplasm of brain: Secondary | ICD-10-CM | POA: Insufficient documentation

## 2015-03-24 DIAGNOSIS — Z510 Encounter for antineoplastic radiation therapy: Secondary | ICD-10-CM | POA: Diagnosis not present

## 2015-03-24 DIAGNOSIS — C801 Malignant (primary) neoplasm, unspecified: Secondary | ICD-10-CM | POA: Diagnosis not present

## 2015-03-24 NOTE — Progress Notes (Signed)
Nicole Valentine is here for follow up of radiation to her breast. She is here to have her scan read, and continue treatment. She complains of pain at her sternum a 2/10 which she describes as a soreness and his taking naproxen for. She has another pain around her mid right back which she rates the same as her sternal pain.   BP 125/76 mmHg  Pulse 109  Temp(Src) 98.4 F (36.9 C)  Ht 5\' 7"  (1.702 m)  Wt 166 lb 6.4 oz (75.479 kg)  BMI 26.06 kg/m2  LMP 03/29/2012   Wt Readings from Last 3 Encounters:  03/24/15 166 lb 6.4 oz (75.479 kg)  03/22/15 167 lb 11.2 oz (76.068 kg)  03/12/15 162 lb (73.483 kg)

## 2015-03-24 NOTE — Addendum Note (Signed)
Encounter addended by: Eppie Gibson, MD on: 03/24/2015 12:21 PM<BR>     Documentation filed: Problem List

## 2015-03-24 NOTE — Progress Notes (Addendum)
  Radiation Oncology         [phone] ________________________________  Name: Nicole Valentine MRN: [id number]  Date: 03/24/2015  DOB: 1966-01-11  SIMULATION AND TREATMENT PLANNING NOTE  Outpatient  DIAGNOSIS:     ICD-9-CM ICD-10-CM   1. Brain metastases ([place]) 198.3 C79.31     NARRATIVE:  The patient was brought to the [facility].  Identity was confirmed.  All relevant records and images related to the planned course of therapy were reviewed.  The patient freely provided informed written consent to proceed with treatment after reviewing the details related to the planned course of therapy. The consent form was witnessed and verified by the simulation staff.    Then, the patient was set-up in a stable reproducible  supine position for radiation therapy.  Aquaplast head mask was custom made for immobilization. CT images were obtained.  Surface markings were placed.  The CT images were loaded into the planning software.    TREATMENT PLANNING NOTE: Treatment planning then occurred.  The radiation prescription was entered and confirmed.    A total of 3 medically necessary complex treatment devices were fabricated and supervised by me: Aquaplast head mask and 2 fields with MLCs to shield eyes, lenses, spinal cord, pharynx . MORE FIELDS WITH MLCs MAY BE ADDED IN DOSIMETRY for dose homogeneity.  I have requested : Isodose Plan.   The patient will receive 35 Gy in 14 fractions to her whole brain.   -----------------------------------  Eppie Gibson, MD

## 2015-03-25 ENCOUNTER — Ambulatory Visit
Admission: RE | Admit: 2015-03-25 | Discharge: 2015-03-25 | Disposition: A | Discharge status: Home / Self Care | Payer: BLUE CROSS/BLUE SHIELD | Source: Ambulatory Visit | Attending: Radiation Oncology | Admitting: Radiation Oncology

## 2015-03-25 DIAGNOSIS — Z510 Encounter for antineoplastic radiation therapy: Secondary | ICD-10-CM | POA: Diagnosis not present

## 2015-03-26 ENCOUNTER — Ambulatory Visit: Payer: BLUE CROSS/BLUE SHIELD

## 2015-03-26 ENCOUNTER — Ambulatory Visit (HOSPITAL_BASED_OUTPATIENT_CLINIC_OR_DEPARTMENT_OTHER): Payer: BLUE CROSS/BLUE SHIELD | Admitting: Oncology

## 2015-03-26 ENCOUNTER — Ambulatory Visit
Admission: RE | Admit: 2015-03-26 | Discharge: 2015-03-26 | Disposition: A | Discharge status: Home / Self Care | Payer: BLUE CROSS/BLUE SHIELD | Source: Ambulatory Visit | Attending: Radiation Oncology | Admitting: Radiation Oncology

## 2015-03-26 ENCOUNTER — Ambulatory Visit (HOSPITAL_BASED_OUTPATIENT_CLINIC_OR_DEPARTMENT_OTHER): Payer: BLUE CROSS/BLUE SHIELD

## 2015-03-26 ENCOUNTER — Other Ambulatory Visit: Payer: Self-pay

## 2015-03-26 VITALS — BP 106/67 | HR 113 | Temp 97.9°F | Resp 18

## 2015-03-26 DIAGNOSIS — C7801 Secondary malignant neoplasm of right lung: Secondary | ICD-10-CM

## 2015-03-26 DIAGNOSIS — C7971 Secondary malignant neoplasm of right adrenal gland: Secondary | ICD-10-CM

## 2015-03-26 DIAGNOSIS — C7951 Secondary malignant neoplasm of bone: Principal | ICD-10-CM

## 2015-03-26 DIAGNOSIS — C50411 Malignant neoplasm of upper-outer quadrant of right female breast: Secondary | ICD-10-CM

## 2015-03-26 DIAGNOSIS — Z171 Estrogen receptor negative status [ER-]: Secondary | ICD-10-CM

## 2015-03-26 DIAGNOSIS — C50911 Malignant neoplasm of unspecified site of right female breast: Secondary | ICD-10-CM

## 2015-03-26 DIAGNOSIS — C787 Secondary malignant neoplasm of liver and intrahepatic bile duct: Secondary | ICD-10-CM

## 2015-03-26 DIAGNOSIS — C778 Secondary and unspecified malignant neoplasm of lymph nodes of multiple regions: Secondary | ICD-10-CM

## 2015-03-26 DIAGNOSIS — Z510 Encounter for antineoplastic radiation therapy: Secondary | ICD-10-CM | POA: Diagnosis not present

## 2015-03-26 DIAGNOSIS — C7802 Secondary malignant neoplasm of left lung: Secondary | ICD-10-CM | POA: Diagnosis not present

## 2015-03-26 DIAGNOSIS — C7931 Secondary malignant neoplasm of brain: Secondary | ICD-10-CM | POA: Diagnosis not present

## 2015-03-26 DIAGNOSIS — R52 Pain, unspecified: Secondary | ICD-10-CM | POA: Insufficient documentation

## 2015-03-26 DIAGNOSIS — C50919 Malignant neoplasm of unspecified site of unspecified female breast: Secondary | ICD-10-CM

## 2015-03-26 LAB — CBC WITH DIFFERENTIAL/PLATELET
BASO%: 0.3 % (ref 0.0–2.0)
Basophils Absolute: 0 10*3/uL (ref 0.0–0.1)
EOS%: 0.6 % (ref 0.0–7.0)
Eosinophils Absolute: 0 10*3/uL (ref 0.0–0.5)
HEMATOCRIT: 33.6 % — AB (ref 34.8–46.6)
HGB: 11.2 g/dL — ABNORMAL LOW (ref 11.6–15.9)
LYMPH#: 0.3 10*3/uL — AB (ref 0.9–3.3)
LYMPH%: 4.2 % — ABNORMAL LOW (ref 14.0–49.7)
MCH: 34.7 pg — ABNORMAL HIGH (ref 25.1–34.0)
MCHC: 33.3 g/dL (ref 31.5–36.0)
MCV: 104.2 fL — ABNORMAL HIGH (ref 79.5–101.0)
MONO#: 0.4 10*3/uL (ref 0.1–0.9)
MONO%: 5.5 % (ref 0.0–14.0)
NEUT%: 89.4 % — AB (ref 38.4–76.8)
NEUTROS ABS: 6.9 10*3/uL — AB (ref 1.5–6.5)
PLATELETS: 253 10*3/uL (ref 145–400)
RBC: 3.23 10*6/uL — ABNORMAL LOW (ref 3.70–5.45)
RDW: 17 % — AB (ref 11.2–14.5)
WBC: 7.7 10*3/uL (ref 3.9–10.3)

## 2015-03-26 LAB — D-DIMER, QUANTITATIVE: D-Dimer, Quant: 1.1 ug/mL-FEU — ABNORMAL HIGH (ref 0.00–0.48)

## 2015-03-26 MED ORDER — OMEPRAZOLE 40 MG PO CPDR
40.0000 mg | DELAYED_RELEASE_CAPSULE | Freq: Every day | ORAL | Status: DC
Start: 2015-03-26 — End: 2015-04-12

## 2015-03-26 MED ORDER — OXYCODONE HCL ER 10 MG PO T12A: 10.0000 mg | EXTENDED_RELEASE_TABLET | Freq: Two times a day (BID) | ORAL | Status: DC

## 2015-03-26 MED ORDER — OXYCODONE HCL 5 MG PO TABS: 5.0000 mg | ORAL_TABLET | ORAL | Status: DC | PRN

## 2015-03-26 NOTE — Progress Notes (Signed)
Patient came to clinic today c/o SOB and pain in her right rib cage that radiates to her back.  Dr. [name] to see patient this morning.

## 2015-03-26 NOTE — Progress Notes (Signed)
And a Patient ID: Niala Stcharles, female   DOB: [birth year], 49 y.o.   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 She had a mammogram performed that showed dense breasts bilaterally. Ultrasound of the right breast showed 2.3 x 1.9 cm area of abnormality with multiple abnormal lymph nodes. MRI of the bilateral breasts showed asymmetrical enhancement throughout the right breast consistent with multicentric disease. An ultrasound-guided biopsy performed. The known area of disease measured 1.9 x 1.9 x 2.3 cm. Multiple abnormal positive right axillary lymph nodes were noted. 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 Biopsy of the right axillary lymph node was positive for malignancy with extracapsular extension. Patient was originally seen by Dr. [name] Dr. [name] and Dr. [name]. She has elected to have a right mastectomy eventually and declined biopsies of any other areas within the breast."  She went on to receive neoadjuvant chemotherapy and attained a complete pathologic response, as documented below  [place] noted a very small right supraclavicular mass 09/25/2014, which she brought to our attention. She was having some allergy symptoms at the time so we decided to reevaluate this after a few weeks and on 10/22/2014 as the mass persisted we obtained a restaging neck CT scan, 10/28/2014. There was bilateral supraclavicular adenopathy, but also a large right pleural effusion was incidentally noted. We proceeded  to right thoracentesis 10/29/2014, and a liter of hazy yellow fluid was removed. Cytology from this procedure ([id number]) showed malignant cells consistent with adenocarcinoma, estrogen and progesterone receptor negative, HER-2 not amplified, with a signals ratio 1.12 and the number per cell being 2.75, and with an MIB-1 of 80%. I called Steffie with these results and set her up for a PET scan performed 11/06/2014, which shows widespread metastatic disease involving particularly the right lung, liver, and bones, but also the left lung, right adrenal, and multiple lymph node areas.  Her subsequent history is as detailed below.  INTERVAL HISTORY: Naly returns today for an unscheduled visit. She has received 2 of the 15 planned radiation treatments. Since starting radiation she has had significant pain associated with positioning. Just getting up to a sitting position from the flat slab is difficult for her. She has pain in the right posterior rib cage and also in the right mid sternum. This pain is worse with deep breathing. She has a little bit of a cough which she has had for some time and this is related to postnasal drip. She is using Claritin for that. There has not been any hemoptysis, fever, or purulent sputum. She has a little bit of hoarseness associated with the allergy problems. Basically the problem is that it is painful of for her to take a deep breath.  REVIEW OF S/YSTEMS: Aside from the issues just described a detailed review of systems today was stable  PAST MEDICAL HISTORY: Past Medical History  Diagnosis Date  . Complication of anesthesia     her father and uncle both had very difficult time waking up-was  something they told them may be hereditory. she will find  out.  . Hypothyroidism   . 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 Radiation 01/21/13-03/06/13    Right Breast  . Breast cancer ([place]) dx'd 04-10-12-rt    PAST SURGICAL HISTORY: Past Surgical History  Procedure  Laterality Date  . Wisdom tooth extraction    . Portacath placement  04/22/2012    Procedure: INSERTION PORT-A-CATH;  Surgeon: [name], MD;  Location: [facility];  Service: General;  Laterality: Left;  [name] Modified mastectomy Right 11/18/2012    Procedure:  RIGHT MODIFIED MASTECTOMY;  Surgeon: [name], MD;  Location: [facility];  Service: General;  Laterality: Right;  . Knee arthroscopy Right 11/26/2013  . Port-a-cath removal Left 12/10/2013    Procedure: MINOR REMOVAL PORT-A-CATH;  Surgeon: [name], MD;  Location: [facility];  Service: General;  Laterality: Left;    FAMILY HISTORY Family History  Problem Relation Age of Onset  . Lung cancer Maternal Grandfather 26    smoker  . Prostate cancer Maternal Grandfather 90  . Throat cancer Other     Great Aunt x 2  . Liver cancer Other     Maternal Great Grandmother  . Melanoma Maternal Uncle 81  . Brain cancer Cousin 11    non-malignant  the patient's parents are living, in their early 32s. The patient has 2 brothers, no sisters. The patient's mother was diagnosed with breast cancer, HER-2 positive, in 2014 in [place].  GYNECOLOGIC HISTORY:   (Updated 10/03/2013) Menarche age 50, first live birth age 24. She is GX P4. LMP January 2014. 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  The children are currently ages 60, 8, 58, and 31. Her husband, [name], is a Customer service manager.He just started a job for [employer].  They attend the [employer] DIRECTIVES: The patient's husband is her HCPOA   HEALTH MAINTENANCE:  (Updated 10/03/2013) Social History  Substance Use Topics  . Smoking status: Never Smoker   . Smokeless tobacco: Never Used  . Alcohol Use: No     Colonoscopy: Never  PAP: November 2013/Dr. Smith  Bone density: January 2015, [facility],  normal  Lipid panel:  Not on file  Allergies  Allergen Reactions  . Almond Meal Rash  . Almond Oil Rash  . Other Rash    LOTIONS-unknown type  . Tegaderm Ag Mesh [Silver] Rash  . Vancomycin Rash    Red Man Syndrome    Current Outpatient Prescriptions  Medication Sig Dispense Refill  . cholecalciferol (VITAMIN D) 1000 UNITS tablet Take 2,000 Units by mouth 2 (two) times daily.     [name] docusate sodium (COLACE) 250 MG capsule Take 1 capsule (250 mg total) by mouth daily. 10 capsule 0  . levothyroxine (SYNTHROID, LEVOTHROID) 175 MCG tablet Take 175 mcg by mouth daily before breakfast.    . Loratadine-Pseudoephedrine (CLARITIN-D 12 HOUR PO) Take 1 tablet by mouth 2 (two) times daily as needed (allergies).    . LORazepam (ATIVAN) 0.5 MG tablet Take 1 tablet (0.5 mg total) by mouth at bedtime as needed for sleep. 30 tablet 0  . naproxen sodium (ALEVE) 220 MG tablet Take 2 tablets (440 mg total) by mouth 2 (two) times daily with a meal.    . omeprazole (PRILOSEC) 40 MG capsule Take 1 capsule (40 mg total) by mouth daily. 30 capsule 6  .  oxyCODONE (OXYCONTIN) 10 mg 12 hr tablet Take 1 tablet (10 mg total) by mouth every 12 (twelve) hours. 60 tablet 0  . oxyCODONE (ROXICODONE) 5 MG immediate release tablet Take 1 tablet (5 mg total) by mouth every 4 (four) hours as needed for severe pain. 30 tablet 0  . polyethylene glycol powder (MIRALAX) powder Take 255 g by mouth once. 1 g 0  . prochlorperazine (COMPAZINE) 10 MG tablet Take 1 tablet (10 mg total) by mouth every 6 (six) hours as needed (Nausea or vomiting). (Patient not taking: Reported on 01/11/2015) 30 tablet 1  . saccharomyces boulardii (FLORASTOR) 250 MG capsule Take 1 capsule (250 mg total) by mouth 2 (two) times daily. 60 capsule 2   No current facility-administered medications for this visit.      OBJECTIVE: Middle-aged white woman  Filed Vitals:   03/26/15 0943  BP: 106/67  Pulse: 113  Temp: 97.9 F (36.6 C)  Resp: 18     There  is no weight on file to calculate BMI.    ECOG FS:1 - Symptomatic but completely ambulatory There were no vitals filed for this visit.  Pulse ox at rest was 98%  Sclerae unicteric, pupils round and equal Oropharynx clear and moist-- no thrush or other lesions No cervical or supraclavicular adenopathy Lungs no rales or rhonchi, limited excursion because of discomfort Heart regular rate and rhythm Abd soft, nontender, positive bowel sounds MSK no focal spinal tenderness, or focal tenderness in the posterior right rib cage at approximately the sixth rib, also medial sternal discomfort at the costochondral junction Neuro: nonfocal, well oriented, appropriate affect Breasts: Deferred    LAB RESULTS:   Lab Results  Component Value Date   WBC 9.8 03/22/2015   NEUTROABS 8.5* 03/22/2015   HGB 11.5* 03/22/2015   HCT 35.4 03/22/2015   MCV 107.3* 03/22/2015   PLT 125* 03/22/2015      Chemistry      Component Value Date/Time   NA 142 03/22/2015 0802   NA 138 02/25/2015 0203   K 4.0 03/22/2015 0802   K 3.4* 02/25/2015 0203   CL 105 02/25/2015 0203   CL 101 10/18/2012 0859   CO2 29 03/22/2015 0802   CO2 25 02/25/2015 0203   BUN 5.6* 03/22/2015 0802   BUN 7 02/25/2015 0203   CREATININE 0.7 03/22/2015 0802   CREATININE 0.54 02/25/2015 0203      Component Value Date/Time   CALCIUM 9.0 03/22/2015 0802   CALCIUM 8.3* 02/25/2015 0203   ALKPHOS 142 03/22/2015 0802   ALKPHOS 94 11/17/2014 2205   AST 15 03/22/2015 0802   AST 22 11/17/2014 2205   ALT 11 03/22/2015 0802   ALT 20 11/17/2014 2205   BILITOT 0.42 03/22/2015 0802   BILITOT 0.6 11/17/2014 2205       STUDIES: Dg Chest 2 View  03/02/2015  CLINICAL DATA:  Metastatic breast cancer. Malignant pleural effusion. EXAM: CHEST  2 VIEW COMPARISON:  01/13/2015 FINDINGS: Small right pleural effusion has developed since the prior study. No effusion on the left. Mild right middle lobe airspace disease similar to the prior study may  represent radiation change as noted on CT. Negative for heart failure. No mass lesion. Port-A-Cath tip in the SVC IMPRESSION: Interval development of small right effusion. This may be related to metastatic disease. Pleural effusion has improved since 10/31/2014 but has progressed from the more recent study. Mild right middle lobe airspace disease, possibly radiation change as noted on CT. Electronically Signed  By: Franchot Gallo M.D.   On: 03/02/2015 11:13   Ct Chest W Contrast  03/03/2015  CLINICAL DATA:  Restaging of breast cancer. Diagnosed 2013 with metastasis to bone. Ongoing chemotherapy. Right mastectomy. Bilateral back pain. Left chest port infection. EXAM: CT CHEST WITH CONTRAST TECHNIQUE: Multidetector CT imaging of the chest was performed during intravenous contrast administration. CONTRAST:  67m OMNIPAQUE IOHEXOL 300 MG/ML  SOLN COMPARISON:  Plain film of 1 day prior. Most recent CT of 01/13/2015. FINDINGS: Mediastinum/Nodes: No supraclavicular adenopathy. A low right jugular node is 6 mm, not pathologic by size criteria. Right mastectomy and axillary node dissection. No axillary adenopathy. A left-sided Port-A-Cath terminates at the superior caval/ atrial junction. Edema is identified superior and lateral to the port site, including on image/series 8/2. Mild cardiomegaly. Small pericardial effusion is new. No central pulmonary embolism, on this non-dedicated study. No well-defined middle mediastinal adenopathy. Ill-defined soft tissue fullness in the right hilum and suprahilar region. This measures on the order of 1.9 x 2.0 cm on image/series 26/2. This is felt to be increased and more cephalad position than hilar soft tissue fullness on the prior. Left infrahilar node measures 1.0 cm on image 30 versus similar on the prior exam (when remeasured). Soft tissue thickening along the transverse aorta measures maximally 8 mm on image 19, new. Multiple small prevascular nodes. Not pathologic by size  criteria. Increased in size, measuring maximally 5 mm today. No internal mammary adenopathy. Lungs/Pleura: Small right pleural effusion is increased since 01/13/2015. Subpleural right upper lobe radiation fibrosis. Somewhat more confluent subpleural right middle lobe opacity is slightly increased including on image/series 32/5. This is also felt represent radiation fibrosis. Slightly improved right base aeration with patchy dependent opacity remaining (likely atelectasis). Minimal left upper lobe subpleural nodularity, including on image/series 19/5 is felt to be unchanged. Upper abdomen: Normal imaged portions of the liver, spleen, stomach, gallbladder, biliary tract, adrenal glands, kidneys. Musculoskeletal: Superior endplate sclerosis and irregularity at T9 is similar. Mild vertebral body height loss. No ventral canal encroachment. IMPRESSION: 1. Increased soft tissue fullness about the left hilum and along the lateral aspect of the transverse aorta. Findings are suspicious for progressive disease. This could either be re-evaluated at followup or more entirely characterized with PET. 2. Increase in small right pleural effusion. 3. Similar T9 sclerosis with mild superior endplate compression deformity. This could be metastatic or posttraumatic. 4. Nonspecific edema superior and lateral to the Port-A-Cath site. Given the clinical history, this could represent cellulitis. No abscess. 5. New small pericardial effusion. Electronically Signed   By: KAbigail MiyamotoM.D.   On: 03/03/2015 16:07   Mr BJeri CosWGHContrast  03/16/2015  CLINICAL DATA:  49year old female with metastatic breast cancer. Status post SRS to for cerebellar metastases in July. Also status post thoracic spine palliative radiation. Subsequent encounter. EXAM: MRI HEAD WITHOUT AND WITH CONTRAST TECHNIQUE: Multiplanar, multiecho pulse sequences of the brain and surrounding structures were obtained without and with intravenous contrast. CONTRAST:  177m MULTIHANCE GADOBENATE DIMEGLUMINE 529 MG/ML IV SOLN COMPARISON:  Targeting Brain MRI 12/03/2014 and earlier. FINDINGS: The previously treated 4 small cerebellar metastases have all regressed. However, there are at least 18 new brain metastases scattered in both cerebellar and cerebral hemispheres, ranging from punctate to an 11 mm gyriform metastasis in the left superior frontal gyrus affecting the motor strip (series 10, image 115). The next largest metastasis is 8 mm in the right periatrial white matter extending to the subependyma. Incidental right anterior  frontal lobe developmental venous anomaly re - demonstrated on series 10, image 103. Minimal to mild associated edema with these new lesions. No mass effect. No acute intracranial hemorrhage identified. No ventriculomegaly. No pachymeningeal thickening identified. Major intracranial vascular flow voids are stable. No restricted diffusion or evidence of acute infarction. Negative visualized cervical spinal cord. Bone marrow signal appears mildly improved since July. No destructive calvarium lesion. Stable visualized internal auditory structures, mastoids, paranasal sinuses, orbit soft tissues and scalp soft tissues. IMPRESSION: 1. At least 18 new small brain metastases ranging from punctate to 11 mm. Minimal to mild associated edema with no mass effect. 2. The four treated cerebellar metastases are all essentially resolved. Electronically Signed   By: Genevie Ann M.D.   On: 03/16/2015 14:17   Nm Pet Image Restag (ps) Skull Base To Thigh  03/17/2015  CLINICAL DATA:  Subsequent treatment strategy for breast cancer. EXAM: NUCLEAR MEDICINE PET SKULL BASE TO THIGH TECHNIQUE: 7.38 mCi F-18 FDG was injected intravenously. Full-ring PET imaging was performed from the skull base to thigh after the radiotracer. CT data was obtained and used for attenuation correction and anatomic localization. FASTING BLOOD GLUCOSE:  Value: 73 mg/dl COMPARISON:  None. FINDINGS: NECK  Hypermetabolic right supraclavicular lymph node measures 9 mm and has an SUV max equal to 9.25. This is new from previous exam. CHEST Hypermetabolic anterior mediastinal and bilateral hilar lymph node metastases are identified. Index right hilar lymph node has an SUV max equal to 8.21. This is new from previous exam. Abnormal uptake within the left hilar region has an SUV max equal to 7.68. Also new from previous exam. Hypermetabolic tumor in the right middle lobe measures 2.8 cm and has an SUV max equal to 7.05. On the previous exam this measured 3.5 cm and had an SUV max equal to 4.91. ABDOMEN/PELVIS There is a focus of increased uptake within the right hepatic lobe. This measures approximately 1 cm and has an SUV max equal to 5.38. On the previous exam the SUV max associated with this lesion was equal to 4.52. Right adrenal gland metastases noted. Lesion measures 1.4 cm and has an SUV max equal to 9.72. This is new from previous exam. Hypermetabolic gastrohepatic ligament lymph node is new measuring 1.1 cm. This has an SUV max equal to 6.08. There is also a hypermetabolic retrocrural lymph node which measures 1 cm and has an SUV max equal to 8.73. New from previous exam. No abnormal uptake identified within the pancreas or left adrenal gland. There is diffuse uptake throughout the spleen. SKELETON Diffuse an markedly increased uptake is identified throughout the axial and appendicular skeleton on the corresponding CT images there is no aggressive lytic or sclerotic bone lesion. Findings are favored to represent stimulated bone marrow. This significantly diminishes ability to detect underlying bone metastasis. IMPRESSION: 1. Interval progression of disease compared with 01/05/2015. Specifically, new hypermetabolic right supraclavicular, anterior mediastinal and bilateral hilar lymph node metastasis. There is also new retrocrural and gastrohepatic ligament hypermetabolic lymph nodes. Pulmonary lesion within the  right middle lobe demonstrates increase in degree of FDG uptake. Additionally, the lesion within the right lobe of liver demonstrates increase in FDG uptake. New right adrenal gland metastasis is also noted. 2. Diffuse uptake throughout the skeleton as well as the spleen is noted and may reflect treatment related changes. Electronically Signed   By: Kerby Moors M.D.   On: 03/17/2015 14:28   Ir Radiologist Eval & Mgmt  03/05/2015  EXAM: ESTABLISHED PATIENT OFFICE  VISIT CHIEF COMPLAINT: Patient seen in follow-up today for assessment of left chest wall port pocket HISTORY OF PRESENT ILLNESS: Mrs. Goddard is a 49 year old white female, patient of Dr. [name], with history of metastatic breast cancer who is familiar to our service from prior left internal jugular Port-A-Cath placement on 11/12/14. Due to concern for port pocket infection the port was removed on 01/08/15 with primary closure of the incision site. A new left IJ Port-A-Cath was placed on 02/01/15. Earlier this month the patient had noticed some drainage at the incision site of the originally placed Port-A-Cath pocket and was empirically started on antibiotics. Subsequent fluid cultures from the site were negative. Due to persistent drainage from the site she presented again to IR on 03/01/15 and underwent placement of an iodoform gauze to the wound. She presents today for follow-up exam of the site. She states that the previous port pocket site is smaller than previous, nontender and the newly placed port is functioning well. She denies any recent fevers, chills, chest pain or significant dyspnea. She continues to remain on Keflex and Bactrim. PHYSICAL EXAMINATION: The old left chest wall port pocket was inspected following iodoform gauze removal. Pocket is mildly erythematous but with some noted granulation and slight decrease in size. No evidence of active bleeding. Site is nontender . The adjacent new Port-A-Cath incision site also has minimal  erythema and is nontender. There is no drainage noted from the incision site. There is a small fragment of absorbable suture at the lateral margin of the new port site. ASSESSMENT AND PLAN: Patient with appropriately healing old left chest wall Port-A-Cath pocket and stable new left chest wall Port-A-Cath site. At this time would recommend daily wet to dry dressings to old port pocket site by home health nurse until wound pocket has completely healed. Case discussed with Dr. Kathlene Cote. Read by: Rowe Robert, PA-C Electronically Signed   By: Aletta Edouard M.D.   On: 03/05/2015 14:05   Ir Radiologist Eval & Mgmt  03/01/2015  CONSULTATION: See note in [facility] Epic Electronically Signed   By: Lucrezia Europe M.D.   On: 03/01/2015 12:01   Ir Radiologist Eval & Mgmt  02/25/2015  EXAM: ESTABLISHED PATIENT OFFICE VISIT CHIEF COMPLAINT: My port site is infected. HISTORY OF PRESENT ILLNESS: Ms Porto is a 49 year old female with a history of metastatic breast carcinoma, who has been receiving chemotherapy via port catheter. Her first port catheter was placed 11/12/2014 via a left IJ approach. This port catheter became infected, and required removal which occurred 01/08/2015. After a line holiday, she had a second left-sided port catheter placed 02/01/2015. She reports that she was healing fine, when suddenly the left chest wall at the site of the explant became inflamed and red with pruritus, and she went to the emergency department 02/24/2015. The wound was cultured and she was started on empiric antibiotic therapy for cellulitis. She presents today for evaluation. She denies fevers, rigors, or chills. Maximum temperature measured before are arrival was 100.5, although she never felt febrile. No flu-like symptoms. She reports that after her last treatment she felt fatigued, which did feel more fatigued than usual after chemotherapy, though this has improved. She and her family have a planned trip to [place], leaving on  Sunday October 9, with return on Wednesday October 12. She has her next planned chemotherapy on Friday October 14th. She reports that her redness and irritation at the explant site have significantly improved since she was started on antibiotic therapy in  the past 24 hours. No drainage or concern for abscess. PHYSICAL EXAMINATION: Directed physical exam demonstrates erythema at the site of the explant, which she is geographically marginated by indelible ink placed by the emergency department physician on the prior day. The diameter of the inked marking is 10-12 cm, and a diameter of the erythema is less than this margin. No ballotable fluid collection or drainage at the healing incision. The site of the new incision from the new IJ catheter is inferior at the 8 o'clock position, and beyond the erythema and does not appear to be involved. ASSESSMENT AND PLAN: I agree with continuation of the empiric antibiotic therapy. She does not appear to be bacteremic. Given that there is no foreign body at the site of the explant, and there is no signs of abscess, there is no indication for attempted incision and drainage. She plans on making the trip to [place], with return on Wednesday, and we will see her in follow-up on the following Thursday or Friday to make sure things are improving. Again she has a planned chemotherapy session on Friday October 14th. She understands and agrees with the plan of care. Signed, Dulcy Fanny. Earleen Newport, DO [facility] Electronically Signed   By: Corrie Mckusick D.O.   On: 02/25/2015 16:45    ASSESSMENT: 49 y.o. BRCA negative [place] woman with triple-negative stage IV breast cancer  (1) an status post right breast upper outer quadrant and right axillary lymph node biopsy 04/04/2012, both positive for an invasive ductal carcinoma, grade 3, triple negative, with an MIB-1 of 25%  (2) Treated neoadjuvantly with  (a) fluorouracil,  cyclophosphamide, and epirubicin (at 100 mg/M2) x4 completed 06/07/2012  (b) docetaxel (75 mg/M2) for one dose, 06/21/2012, poorly tolerated  (c) carboplatin and gemcitabine given every 21 days for 6 cycles completed 10/11/2012  (3) status post right modified radical mastectomy 11/18/2012 showing a complete pathologic response--all 16 lymph nodes were benign  (a) no plans for reconstructon  (4) adjuvant radiation therapy completed 03/06/2013  METASTATIC DISEASE June 2016 (5) 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, 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  (6) staging PET scan 11/06/2014 showed significant disease in the middle and lower lobes of the Right lung, Right effusion, multiple liver and bone lesions, as well as left lung nodules, a Right adrenal nodule, and widespread hypermetabolic adenopathy  (a) brain MRI 11/16/2014 showed multiple cerebellar lesions  (7) RADIATION TREATMENTS thoracic metastases  (a) 01/28/2015-02/11/2015: Thoracic Spine T8-10, 30 Gy in 10 fractions  (7) abraxane day 1 and day 8 of each 21 day cycle started 11/09/2014: last dose 03/12/2015 (6 cycles)  (a) PET scan 01/05/2015 shows an excellent initial response after 3 cycles  (b) PET scan 03/17/2015 shows progression  (8) zolendronate started 11/16/2014, to be repeated every 12 weeks, most recent dose 02/09/2015  (9) Foundation 1 study sent  11/06/2014: patient's sister in law Garvin Fila following [phone])  (a) 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[place], NTRK1, MYC, ARID1A and LYN, none w approved therapies  (b) pazopanib, ponatinib or crizotinib suggested as possible off-protocol options  (10) RADIATION TREATMENTS: Brain metastases:  (a) s/p SRS therapy 12/11/2014 to 4 right cerebellar lesions  (b) multiple (18+) lesions noted on repeat brain MRI 03/16/2015  (11) uncontrolled pain: Started on OxyContin 10 mg twice a day with oxycodone 5 mg as needed 03/26/2015  PLAN: Jeriyah is having  quite a bit of pain, which localizes to the right posterior ribs and the right sternum.  I think her breathing problems are really due to the fact that every time her ribs move she hurts. I don't think she is having a pulmonary emboli, and she has a sat of 98%. I'm going to check a d-dimer today just in case and I am also going to obtain a complete blood count to make sure we don't have anemia complicating the picture.  To control the pain she already is on Aleve 440 mg twice daily. If she continues on this without some protection she may end up with severe gastritis or ulcers so I will start her on omeprazole 40 mg daily. She also needs to start on narcotics at this point.  We discussed the goal of narcotics which is to allow the patient to become more normally functional. The goal is not to have no pain but to bring the pain to a level of 3 or less. That means one is somewhat aware of the pain but can function fairly normally for the most part. The we are starting with OxyContin 10 mg twice daily with oxycodone 5 mg as needed. She is to start a bowel prophylaxis regimen with stool softeners twice daily and with MiraLAX daily.  My expectation is that within a day or 2 her breathing will be much more comfortable because she will not be having the rib cage and sternal rib joint pain every time she takes a deep breath. However if her breathing does not seem to improve despite the pain being under control obtaining a CT angioma maybe a good idea.  She is tolerating the radiation well so far. She has just about 2-1/2 weeks to go. During that time she will be trying to obtain second opinions from Drs. Hiram Comber at [facility] and mark him at [facility]. We can then assist and putting those plans into place. She wondered if she should be going to [facility] on [address] and there is nothing wrong with going there but basically we have planning of standard treatments we can do if there is noted an easily  accessible trial for her. The question is whether her Foundation 1 results would direct therapy for her.  Today I gave her a copy of the kids Path pamphlet and suggested she enroll her children in that program.  She read he has an appointment with me for next Tuesday. She knows to call for any problems that may develop before that. Chauncey Cruel, MD

## 2015-03-29 ENCOUNTER — Ambulatory Visit: Payer: BLUE CROSS/BLUE SHIELD | Admitting: Radiation Oncology

## 2015-03-29 ENCOUNTER — Telehealth: Payer: Self-pay | Admitting: *Deleted

## 2015-03-29 ENCOUNTER — Encounter: Payer: Self-pay | Admitting: Radiation Oncology

## 2015-03-29 ENCOUNTER — Ambulatory Visit
Admission: RE | Admit: 2015-03-29 | Discharge: 2015-03-29 | Disposition: A | Discharge status: Home / Self Care | Payer: BLUE CROSS/BLUE SHIELD | Source: Ambulatory Visit | Attending: Radiation Oncology | Admitting: Radiation Oncology

## 2015-03-29 VITALS — BP 114/66 | HR 103 | Temp 98.7°F | Ht 67.0 in | Wt 169.8 lb

## 2015-03-29 DIAGNOSIS — C7931 Secondary malignant neoplasm of brain: Secondary | ICD-10-CM

## 2015-03-29 DIAGNOSIS — Z510 Encounter for antineoplastic radiation therapy: Secondary | ICD-10-CM | POA: Diagnosis not present

## 2015-03-29 NOTE — Progress Notes (Signed)
Nicole Valentine is here for her 3rd fraction of radiation to her brain. She still has pain to her Right axilla, and around her to her right back. She is taking oxycodone to help relieve this pain. She reports if she stays still for a while she gets very sleepy, and she is taking more naps and pacing her activities to not become to overwhelmed. She reports some nausea Sunday after her medicines, but it improved later that day, and has not returned.   BP 114/66 mmHg  Pulse 103  Temp(Src) 98.7 F (37.1 C)  Ht 5\' 7"  (1.702 m)  Wt 169 lb 12.8 oz (77.021 kg)  BMI 26.59 kg/m2  LMP 03/29/2012   Wt Readings from Last 3 Encounters:  03/29/15 169 lb 12.8 oz (77.021 kg)  03/24/15 166 lb 6.4 oz (75.479 kg)  03/22/15 167 lb 11.2 oz (76.068 kg)

## 2015-03-29 NOTE — Progress Notes (Signed)
    Weekly Management Note:  Outpatient    ICD-9-CM ICD-10-CM   1. Brain metastases (HCC) 198.3 C79.31     Current Dose:  7.5 Gy  Projected Dose: 35 Gy   Narrative:  The patient presents for routine under treatment assessment.  CBCT/MVCT images/Port film x-rays were reviewed.  The chart was checked. Had a self limited episode of  Nausea, fatigued.  [facility] consult delayed until this week.  She has new axillary pain on right, and continued right back pain, intermittent, worse with coughing. oxycodone helps.  Physical Findings:  height is 5\' 7"  (1.702 m) and weight is 169 lb 12.8 oz (77.021 kg). Her temperature is 98.7 F (37.1 C). Her blood pressure is 114/66 and her pulse is 103.   Wt Readings from Last 3 Encounters:  03/29/15 169 lb 12.8 oz (77.021 kg)  03/24/15 166 lb 6.4 oz (75.479 kg)  03/22/15 167 lb 11.2 oz (76.068 kg)   NAD, no oral thrush.  Impression:  The patient is tolerating radiotherapy.  Plan:  Continue radiotherapy as planned. At this time, there is not an obvious indication for palliative RT for her pain. Continue to monitor this.  ________________________________   Eppie Gibson, M.D.

## 2015-03-29 NOTE — Telephone Encounter (Signed)
On 03-29-15 fax medical records to bridgette at [facility] it was consult, end of tx note, sim & planning note, ,dos will do there part.

## 2015-03-30 ENCOUNTER — Ambulatory Visit (HOSPITAL_COMMUNITY)
Admission: RE | Admit: 2015-03-30 | Discharge: 2015-03-30 | Disposition: A | Discharge status: Home / Self Care | Payer: BLUE CROSS/BLUE SHIELD | Source: Ambulatory Visit | Attending: Oncology | Admitting: Oncology

## 2015-03-30 ENCOUNTER — Telehealth: Payer: Self-pay | Admitting: *Deleted

## 2015-03-30 ENCOUNTER — Other Ambulatory Visit: Payer: Self-pay

## 2015-03-30 ENCOUNTER — Ambulatory Visit
Admission: RE | Admit: 2015-03-30 | Discharge: 2015-03-30 | Disposition: A | Discharge status: Home / Self Care | Payer: BLUE CROSS/BLUE SHIELD | Source: Ambulatory Visit | Attending: Radiation Oncology | Admitting: Radiation Oncology

## 2015-03-30 ENCOUNTER — Ambulatory Visit (HOSPITAL_COMMUNITY)
Admission: RE | Admit: 2015-03-30 | Discharge: 2015-03-30 | Disposition: A | Discharge status: Home / Self Care | Payer: BLUE CROSS/BLUE SHIELD | Source: Ambulatory Visit | Attending: Radiology | Admitting: Radiology

## 2015-03-30 ENCOUNTER — Ambulatory Visit (HOSPITAL_BASED_OUTPATIENT_CLINIC_OR_DEPARTMENT_OTHER): Payer: BLUE CROSS/BLUE SHIELD | Admitting: Oncology

## 2015-03-30 ENCOUNTER — Ambulatory Visit (HOSPITAL_COMMUNITY): Payer: BLUE CROSS/BLUE SHIELD

## 2015-03-30 ENCOUNTER — Ambulatory Visit: Payer: BLUE CROSS/BLUE SHIELD

## 2015-03-30 VITALS — BP 119/75 | HR 113 | Temp 98.2°F | Resp 20 | Ht 67.0 in | Wt 167.6 lb

## 2015-03-30 DIAGNOSIS — C7971 Secondary malignant neoplasm of right adrenal gland: Secondary | ICD-10-CM

## 2015-03-30 DIAGNOSIS — C7801 Secondary malignant neoplasm of right lung: Secondary | ICD-10-CM | POA: Diagnosis not present

## 2015-03-30 DIAGNOSIS — C778 Secondary and unspecified malignant neoplasm of lymph nodes of multiple regions: Secondary | ICD-10-CM

## 2015-03-30 DIAGNOSIS — Z171 Estrogen receptor negative status [ER-]: Secondary | ICD-10-CM

## 2015-03-30 DIAGNOSIS — C50919 Malignant neoplasm of unspecified site of unspecified female breast: Secondary | ICD-10-CM | POA: Diagnosis present

## 2015-03-30 DIAGNOSIS — R52 Pain, unspecified: Secondary | ICD-10-CM

## 2015-03-30 DIAGNOSIS — J90 Pleural effusion, not elsewhere classified: Secondary | ICD-10-CM | POA: Insufficient documentation

## 2015-03-30 DIAGNOSIS — C7802 Secondary malignant neoplasm of left lung: Secondary | ICD-10-CM | POA: Diagnosis not present

## 2015-03-30 DIAGNOSIS — Z9889 Other specified postprocedural states: Secondary | ICD-10-CM

## 2015-03-30 DIAGNOSIS — C7951 Secondary malignant neoplasm of bone: Principal | ICD-10-CM

## 2015-03-30 DIAGNOSIS — C50411 Malignant neoplasm of upper-outer quadrant of right female breast: Secondary | ICD-10-CM | POA: Diagnosis not present

## 2015-03-30 DIAGNOSIS — C7931 Secondary malignant neoplasm of brain: Secondary | ICD-10-CM

## 2015-03-30 DIAGNOSIS — C799 Secondary malignant neoplasm of unspecified site: Secondary | ICD-10-CM | POA: Diagnosis present

## 2015-03-30 DIAGNOSIS — C787 Secondary malignant neoplasm of liver and intrahepatic bile duct: Secondary | ICD-10-CM

## 2015-03-30 DIAGNOSIS — R0602 Shortness of breath: Secondary | ICD-10-CM | POA: Diagnosis present

## 2015-03-30 DIAGNOSIS — Z510 Encounter for antineoplastic radiation therapy: Secondary | ICD-10-CM | POA: Diagnosis not present

## 2015-03-30 NOTE — Telephone Encounter (Signed)
Received call from patient @ 8:11 am. She states that she is having increased coughing-especially in the evening with productive white foamy secretions.  She states she is progressively more short of breath, even sitting upright and with minimal activity. Denies fever. But states that her cough is so strong that she feels as if she will vomit.  She states she has an appointment with Radiation Oncology @ 12:15 pm today and I advised her her to inform them of her condition. Also I advised patient that she has 5pm appt with Dr. [name].   She voiced understanding. She asked that if she needed to do anything like a CXR, that she could do it between her appts today.   She can be reached @ [phone].

## 2015-03-30 NOTE — Progress Notes (Signed)
And a Patient ID: Nicole Valentine, female   DOB: February 05, 1966, 49 y.o.   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 She had a mammogram performed that showed dense breasts bilaterally. Ultrasound of the right breast showed 2.3 x 1.9 cm area of abnormality with multiple abnormal lymph nodes. MRI of the bilateral breasts showed asymmetrical enhancement throughout the right breast consistent with multicentric disease. An ultrasound-guided biopsy performed. The known area of disease measured 1.9 x 1.9 x 2.3 cm. Multiple abnormal positive right axillary lymph nodes were noted. 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 Biopsy of the right axillary lymph node was positive for malignancy with extracapsular extension. Patient was originally seen by Dr. Margot Valentine Dr. Truddie Valentine and Dr. Pablo Valentine. She has elected to have a right mastectomy eventually and declined biopsies of any other areas within the breast."  She went on to receive neoadjuvant chemotherapy and attained a complete pathologic response, as documented below  METASTATIC DISEASE: From the prior summary note: Nicole Valentine noted a very small right supraclavicular mass 09/25/2014, which she brought to our attention. She was having some allergy symptoms at the time so we decided to reevaluate this after a few weeks and on 10/22/2014 as the mass persisted we obtained a restaging neck CT scan, 10/28/2014. There was bilateral supraclavicular adenopathy, but also a large right pleural  effusion was incidentally noted. We proceeded to right thoracentesis 10/29/2014, and a liter of hazy yellow fluid was removed. Cytology from this procedure ([id number]) showed malignant cells consistent with adenocarcinoma, estrogen and progesterone receptor negative, HER-2 not amplified, with a signals ratio 1.12 and the number per cell being 2.75, and with an MIB-1 of 80%. I called Nicole Valentine with these results and set her up for a PET scan performed 11/06/2014, which shows widespread metastatic disease involving particularly the right lung, liver, and bones, but also the left lung, right adrenal, and multiple lymph node areas.  Her subsequent history is as detailed below.  INTERVAL HISTORY: Nicole Valentine returns today for an unscheduled visit. She continues to receive whole brain irradiation. I saw her 03/26/2015 when she was having uncontrolled pain. We prescribed OxyContin 10 mg twice daily and oxycodone as needed. However her insurance company initially did not approve the OxyContin so she has not yet started that. We finally did get it approved today and she will be starting tonight. She has been taking the oxycodone. It makes her sleepy, not nauseated or confused. She is a little bit constipated, but she is using stool softeners for that.  She was supposed to have seen me today at 5 PM, but she came in early, after her radiation, to report that she was having increased shortness of breath. We went ahead and got a chest x-ray and she has a very large right pleural effusion which is doubtless contributing to that problem. In addition, she has been having brief but paroxysmal episodes of coughing. They will last about 15 minutes at a time. Fortunately there relatively rare. When they occur she gets lancinating pain across the right breast to  the right scapula. If she braces herself and it doesn't hurt. She feels the cough is coming from the lungs not from a tickle in her throat. She does bring up a little bit  of clear phlegm.   REVIEW OF S/YSTEMS: She denies unusual headaches, visual changes, nausea, or vomiting. Aside from the issues just described a detailed review of systems today was stable  PAST MEDICAL HISTORY: Past Medical History  Diagnosis Date  . Complication of anesthesia     her father and uncle both had very difficult time waking up-was  something they told them may be hereditory. she will find out.  [name] Hypothyroidism   . 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 Radiation 01/21/13-03/06/13    Right Breast  . Breast cancer ([place]) dx'd 04-10-12-rt    PAST SURGICAL HISTORY: Past Surgical History  Procedure Laterality Date  . Wisdom tooth extraction    . Portacath placement  04/22/2012    Procedure: INSERTION PORT-A-CATH;  Surgeon: Nicole Lasso, MD;  Location: [facility];  Service: General;  Laterality: Left;  [name] Modified mastectomy Right 11/18/2012    Procedure:  RIGHT MODIFIED MASTECTOMY;  Surgeon: Nicole Lasso, MD;  Location: [facility];  Service: General;  Laterality: Right;  . Knee arthroscopy Right 11/26/2013  . Port-a-cath removal Left 12/10/2013    Procedure: MINOR REMOVAL PORT-A-CATH;  Surgeon: Nicole Hector, MD;  Location: [facility];  Service: General;  Laterality: Left;    FAMILY HISTORY Family History  Problem Relation Age of Onset  . Lung cancer Maternal Grandfather 36    smoker  . Prostate cancer Maternal Grandfather 90  . Throat cancer Other     Great Aunt x 2  . Liver cancer Other     Maternal Great Grandmother  . Melanoma Maternal Uncle 81  . Brain cancer Cousin 11    non-malignant  the patient's parents are living, in their early 3s. The patient has 2 brothers, no sisters. The patient's mother was diagnosed with breast cancer, HER-2 positive, in 2014 in [place].  GYNECOLOGIC HISTORY:   (Updated 10/03/2013) Menarche age 71, first live birth age 25. She is GX P4. LMP January 2014. 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  The children are currently ages 83, 3, 37, and 64. Her husband, Nicole Valentine Puller, is a Customer service manager.He just started a job for [employer].  They attend the [employer] DIRECTIVES: The patient's husband is her HCPOA   HEALTH MAINTENANCE:  (Updated 10/03/2013) Social History  Substance Use Topics  . Smoking status: Never Smoker   . Smokeless tobacco: Never Used  . Alcohol Use: No     Colonoscopy: Never  PAP: November 2013/Dr. Smith  Bone density: January 2015, [facility], normal  Lipid panel:  Not on file  Allergies  Allergen Reactions  . Almond Meal Rash  . Almond Oil Rash  . Other Rash    LOTIONS-unknown type  . Tegaderm Ag Mesh [Silver] Rash  . Vancomycin Rash    Red Man Syndrome    Current Outpatient Prescriptions  Medication Sig Dispense Refill  . cholecalciferol (VITAMIN D) 1000 UNITS tablet Take 2,000 Units by mouth 2 (two) times daily.     [name] docusate sodium (COLACE) 250 MG capsule Take 1 capsule (250 mg total) by mouth daily. 10 capsule 0  .  levothyroxine (SYNTHROID, LEVOTHROID) 175 MCG tablet Take 175 mcg by mouth daily before breakfast.    . Loratadine-Pseudoephedrine (CLARITIN-D 12 HOUR PO) Take 1 tablet by mouth 2 (two) times daily as needed (allergies).    . LORazepam (ATIVAN) 0.5 MG tablet Take 1 tablet (0.5 mg total) by mouth at bedtime as needed for sleep. 30 tablet 0  . naproxen sodium (ALEVE) 220 MG tablet Take 2 tablets (440 mg total) by mouth 2 (two) times daily with a meal.    . omeprazole (PRILOSEC) 40 MG capsule Take 1 capsule (40 mg total) by mouth daily. 30 capsule 6  . oxyCODONE (OXYCONTIN) 10 mg 12 hr tablet Take 1 tablet (10 mg total) by mouth every 12 (twelve) hours. (Patient not taking: Reported on 03/29/2015) 60 tablet 0  . oxyCODONE (ROXICODONE) 5 MG immediate release tablet Take 1 tablet (5 mg total) by mouth every 4  (four) hours as needed for severe pain. 30 tablet 0  . polyethylene glycol powder (MIRALAX) powder Take 255 g by mouth once. 1 g 0  . prochlorperazine (COMPAZINE) 10 MG tablet Take 1 tablet (10 mg total) by mouth every 6 (six) hours as needed (Nausea or vomiting). (Patient not taking: Reported on 01/11/2015) 30 tablet 1  . saccharomyces boulardii (FLORASTOR) 250 MG capsule Take 1 capsule (250 mg total) by mouth 2 (two) times daily. 60 capsule 2   No current facility-administered medications for this visit.      OBJECTIVE: Middle-aged white woman who appears stated age 29 Vitals:   03/30/15 1306  BP: 119/75  Pulse: 113  Temp: 98.2 F (36.8 C)  Resp: 20     Body mass index is 26.24 kg/(m^2).    ECOG FS:1 - Symptomatic but completely ambulatory Filed Weights   03/30/15 1306  Weight: 167 lb 9.6 oz (76.023 kg)    Sclerae unicteric, EOMs intact Oropharynx clear, dentition in good repair No cervical or supraclavicular adenopathy Lungs no rales or rhonchi; dullness to percussion most of the right lung Heart regular rate and rhythm Abd soft, nontender, positive bowel sounds MSK no focal spinal tenderness, no upper extremity lymphedema Neuro: nonfocal, well oriented, appropriate affect Breasts: Deferred  LAB RESULTS:   Lab Results  Component Value Date   WBC 7.7 03/26/2015   NEUTROABS 6.9* 03/26/2015   HGB 11.2* 03/26/2015   HCT 33.6* 03/26/2015   MCV 104.2* 03/26/2015   PLT 253 03/26/2015      Chemistry      Component Value Date/Time   NA 142 03/22/2015 0802   NA 138 02/25/2015 0203   K 4.0 03/22/2015 0802   K 3.4* 02/25/2015 0203   CL 105 02/25/2015 0203   CL 101 10/18/2012 0859   CO2 29 03/22/2015 0802   CO2 25 02/25/2015 0203   BUN 5.6* 03/22/2015 0802   BUN 7 02/25/2015 0203   CREATININE 0.7 03/22/2015 0802   CREATININE 0.54 02/25/2015 0203      Component Value Date/Time   CALCIUM 9.0 03/22/2015 0802   CALCIUM 8.3* 02/25/2015 0203   ALKPHOS 142 03/22/2015  0802   ALKPHOS 94 11/17/2014 2205   AST 15 03/22/2015 0802   AST 22 11/17/2014 2205   ALT 11 03/22/2015 0802   ALT 20 11/17/2014 2205   BILITOT 0.42 03/22/2015 0802   BILITOT 0.6 11/17/2014 2205       STUDIES: Dg Chest 2 View  03/30/2015  CLINICAL DATA:  Increase in shortness of breath and secretions. Cough. Metastatic breast cancer. EXAM: CHEST -  2 VIEW COMPARISON:  PET scan 03/17/2015.  CT chest 03/03/2015. FINDINGS: The heart size is normal. A left IJ Port-A-Cath is noted. The right pleural effusion has significantly increased encompassing over half hemi thorax. The inferior right hemithorax is completely opacified. The left lung is clear. There is no significant left effusion. IMPRESSION: 1. Progressive large right pleural effusion obscuring underlying airspace disease and tumor. 2. Stable Port-A-Cath. These results will be called to the ordering clinician or representative by the Radiologist Assistant, and communication documented in the PACS or zVision Dashboard. Electronically Signed   By: San Morelle M.D.   On: 03/30/2015 12:58   Dg Chest 2 View  03/02/2015  CLINICAL DATA:  Metastatic breast cancer. Malignant pleural effusion. EXAM: CHEST  2 VIEW COMPARISON:  01/13/2015 FINDINGS: Small right pleural effusion has developed since the prior study. No effusion on the left. Mild right middle lobe airspace disease similar to the prior study may represent radiation change as noted on CT. Negative for heart failure. No mass lesion. Port-A-Cath tip in the SVC IMPRESSION: Interval development of small right effusion. This may be related to metastatic disease. Pleural effusion has improved since 10/31/2014 but has progressed from the more recent study. Mild right middle lobe airspace disease, possibly radiation change as noted on CT. Electronically Signed   By: Franchot Gallo M.D.   On: 03/02/2015 11:13   Ct Chest W Contrast  03/03/2015  CLINICAL DATA:  Restaging of breast cancer.  Diagnosed 2013 with metastasis to bone. Ongoing chemotherapy. Right mastectomy. Bilateral back pain. Left chest port infection. EXAM: CT CHEST WITH CONTRAST TECHNIQUE: Multidetector CT imaging of the chest was performed during intravenous contrast administration. CONTRAST:  59m OMNIPAQUE IOHEXOL 300 MG/ML  SOLN COMPARISON:  Plain film of 1 day prior. Most recent CT of 01/13/2015. FINDINGS: Mediastinum/Nodes: No supraclavicular adenopathy. A low right jugular node is 6 mm, not pathologic by size criteria. Right mastectomy and axillary node dissection. No axillary adenopathy. A left-sided Port-A-Cath terminates at the superior caval/ atrial junction. Edema is identified superior and lateral to the port site, including on image/series 8/2. Mild cardiomegaly. Small pericardial effusion is new. No central pulmonary embolism, on this non-dedicated study. No well-defined middle mediastinal adenopathy. Ill-defined soft tissue fullness in the right hilum and suprahilar region. This measures on the order of 1.9 x 2.0 cm on image/series 26/2. This is felt to be increased and more cephalad position than hilar soft tissue fullness on the prior. Left infrahilar node measures 1.0 cm on image 30 versus similar on the prior exam (when remeasured). Soft tissue thickening along the transverse aorta measures maximally 8 mm on image 19, new. Multiple small prevascular nodes. Not pathologic by size criteria. Increased in size, measuring maximally 5 mm today. No internal mammary adenopathy. Lungs/Pleura: Small right pleural effusion is increased since 01/13/2015. Subpleural right upper lobe radiation fibrosis. Somewhat more confluent subpleural right middle lobe opacity is slightly increased including on image/series 32/5. This is also felt represent radiation fibrosis. Slightly improved right base aeration with patchy dependent opacity remaining (likely atelectasis). Minimal left upper lobe subpleural nodularity, including on  image/series 19/5 is felt to be unchanged. Upper abdomen: Normal imaged portions of the liver, spleen, stomach, gallbladder, biliary tract, adrenal glands, kidneys. Musculoskeletal: Superior endplate sclerosis and irregularity at T9 is similar. Mild vertebral body height loss. No ventral canal encroachment. IMPRESSION: 1. Increased soft tissue fullness about the left hilum and along the lateral aspect of the transverse aorta. Findings are suspicious for progressive disease.  This could either be re-evaluated at followup or more entirely characterized with PET. 2. Increase in small right pleural effusion. 3. Similar T9 sclerosis with mild superior endplate compression deformity. This could be metastatic or posttraumatic. 4. Nonspecific edema superior and lateral to the Port-A-Cath site. Given the clinical history, this could represent cellulitis. No abscess. 5. New small pericardial effusion. Electronically Signed   By: Abigail Miyamoto M.D.   On: 03/03/2015 16:07   Mr Jeri Cos YS Contrast  03/16/2015  CLINICAL DATA:  49 year old female with metastatic breast cancer. Status post SRS to for cerebellar metastases in July. Also status post thoracic spine palliative radiation. Subsequent encounter. EXAM: MRI HEAD WITHOUT AND WITH CONTRAST TECHNIQUE: Multiplanar, multiecho pulse sequences of the brain and surrounding structures were obtained without and with intravenous contrast. CONTRAST:  41m MULTIHANCE GADOBENATE DIMEGLUMINE 529 MG/ML IV SOLN COMPARISON:  Targeting Brain MRI 12/03/2014 and earlier. FINDINGS: The previously treated 4 small cerebellar metastases have all regressed. However, there are at least 18 new brain metastases scattered in both cerebellar and cerebral hemispheres, ranging from punctate to an 11 mm gyriform metastasis in the left superior frontal gyrus affecting the motor strip (series 10, image 115). The next largest metastasis is 8 mm in the right periatrial white matter extending to the  subependyma. Incidental right anterior frontal lobe developmental venous anomaly re - demonstrated on series 10, image 103. Minimal to mild associated edema with these new lesions. No mass effect. No acute intracranial hemorrhage identified. No ventriculomegaly. No pachymeningeal thickening identified. Major intracranial vascular flow voids are stable. No restricted diffusion or evidence of acute infarction. Negative visualized cervical spinal cord. Bone marrow signal appears mildly improved since July. No destructive calvarium lesion. Stable visualized internal auditory structures, mastoids, paranasal sinuses, orbit soft tissues and scalp soft tissues. IMPRESSION: 1. At least 18 new small brain metastases ranging from punctate to 11 mm. Minimal to mild associated edema with no mass effect. 2. The four treated cerebellar metastases are all essentially resolved. Electronically Signed   By: HGenevie AnnM.D.   On: 03/16/2015 14:17   Nm Pet Image Restag (ps) Skull Base To Thigh  03/17/2015  CLINICAL DATA:  Subsequent treatment strategy for breast cancer. EXAM: NUCLEAR MEDICINE PET SKULL BASE TO THIGH TECHNIQUE: 7.38 mCi F-18 FDG was injected intravenously. Full-ring PET imaging was performed from the skull base to thigh after the radiotracer. CT data was obtained and used for attenuation correction and anatomic localization. FASTING BLOOD GLUCOSE:  Value: 73 mg/dl COMPARISON:  None. FINDINGS: NECK Hypermetabolic right supraclavicular lymph node measures 9 mm and has an SUV max equal to 9.25. This is new from previous exam. CHEST Hypermetabolic anterior mediastinal and bilateral hilar lymph node metastases are identified. Index right hilar lymph node has an SUV max equal to 8.21. This is new from previous exam. Abnormal uptake within the left hilar region has an SUV max equal to 7.68. Also new from previous exam. Hypermetabolic tumor in the right middle lobe measures 2.8 cm and has an SUV max equal to 7.05. On the  previous exam this measured 3.5 cm and had an SUV max equal to 4.91. ABDOMEN/PELVIS There is a focus of increased uptake within the right hepatic lobe. This measures approximately 1 cm and has an SUV max equal to 5.38. On the previous exam the SUV max associated with this lesion was equal to 4.52. Right adrenal gland metastases noted. Lesion measures 1.4 cm and has an SUV max equal to 9.72. This is  new from previous exam. Hypermetabolic gastrohepatic ligament lymph node is new measuring 1.1 cm. This has an SUV max equal to 6.08. There is also a hypermetabolic retrocrural lymph node which measures 1 cm and has an SUV max equal to 8.73. New from previous exam. No abnormal uptake identified within the pancreas or left adrenal gland. There is diffuse uptake throughout the spleen. SKELETON Diffuse an markedly increased uptake is identified throughout the axial and appendicular skeleton on the corresponding CT images there is no aggressive lytic or sclerotic bone lesion. Findings are favored to represent stimulated bone marrow. This significantly diminishes ability to detect underlying bone metastasis. IMPRESSION: 1. Interval progression of disease compared with 01/05/2015. Specifically, new hypermetabolic right supraclavicular, anterior mediastinal and bilateral hilar lymph node metastasis. There is also new retrocrural and gastrohepatic ligament hypermetabolic lymph nodes. Pulmonary lesion within the right middle lobe demonstrates increase in degree of FDG uptake. Additionally, the lesion within the right lobe of liver demonstrates increase in FDG uptake. New right adrenal gland metastasis is also noted. 2. Diffuse uptake throughout the skeleton as well as the spleen is noted and may reflect treatment related changes. Electronically Signed   By: Kerby Moors M.D.   On: 03/17/2015 14:28   Ir Radiologist Eval & Mgmt  03/05/2015  EXAM: ESTABLISHED PATIENT OFFICE VISIT CHIEF COMPLAINT: Patient seen in follow-up today  for assessment of left chest wall port pocket HISTORY OF PRESENT ILLNESS: Nicole Valentine is a 49 year old white female, patient of Dr. [name], with history of metastatic breast cancer who is familiar to our service from prior left internal jugular Port-A-Cath placement on 11/12/14. Due to concern for port pocket infection the port was removed on 01/08/15 with primary closure of the incision site. A new left IJ Port-A-Cath was placed on 02/01/15. Earlier this month the patient had noticed some drainage at the incision site of the originally placed Port-A-Cath pocket and was empirically started on antibiotics. Subsequent fluid cultures from the site were negative. Due to persistent drainage from the site she presented again to IR on 03/01/15 and underwent placement of an iodoform gauze to the wound. She presents today for follow-up exam of the site. She states that the previous port pocket site is smaller than previous, nontender and the newly placed port is functioning well. She denies any recent fevers, chills, chest pain or significant dyspnea. She continues to remain on Keflex and Bactrim. PHYSICAL EXAMINATION: The old left chest wall port pocket was inspected following iodoform gauze removal. Pocket is mildly erythematous but with some noted granulation and slight decrease in size. No evidence of active bleeding. Site is nontender . The adjacent new Port-A-Cath incision site also has minimal erythema and is nontender. There is no drainage noted from the incision site. There is a small fragment of absorbable suture at the lateral margin of the new port site. ASSESSMENT AND PLAN: Patient with appropriately healing old left chest wall Port-A-Cath pocket and stable new left chest wall Port-A-Cath site. At this time would recommend daily wet to dry dressings to old port pocket site by home health nurse until wound pocket has completely healed. Case discussed with Dr. Kathlene Cote. Read by: Rowe Robert, PA-C Electronically  Signed   By: Aletta Edouard M.D.   On: 03/05/2015 14:05   Ir Radiologist Eval & Mgmt  03/01/2015  CONSULTATION: See note in [facility] Epic Electronically Signed   By: Lucrezia Europe M.D.   On: 03/01/2015 12:01    ASSESSMENT: 49 y.o. BRCA negative [facility]  woman with triple-negative stage IV breast cancer  (1) an status post right breast upper outer quadrant and right axillary lymph node biopsy 04/04/2012, both positive for an invasive ductal carcinoma, grade 3, triple negative, with an MIB-1 of 25%  (2) Treated neoadjuvantly with  (a) fluorouracil, cyclophosphamide, and epirubicin (at 100 mg/M2) x4 completed 06/07/2012  (b) docetaxel (75 mg/M2) for one dose, 06/21/2012, poorly tolerated  (c) carboplatin and gemcitabine given every 21 days for 6 cycles completed 10/11/2012  (3) status post right modified radical mastectomy 11/18/2012 showing a complete pathologic response--all 16 lymph nodes were benign  (a) no plans for reconstructon  (4) adjuvant radiation therapy completed 03/06/2013  METASTATIC DISEASE June 2016 (5) 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, 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  (6) staging PET scan 11/06/2014 showed significant disease in the middle and lower lobes of the Right lung, Right effusion, multiple liver and bone lesions, as well as left lung nodules, a Right adrenal nodule, and widespread hypermetabolic adenopathy  (a) brain MRI 11/16/2014 showed multiple cerebellar lesions  (7) RADIATION TREATMENTS thoracic metastases  (a) 01/28/2015-02/11/2015: Thoracic Spine T8-10, 30 Gy in 10 fractions  (7) abraxane day 1 and day 8 of each 21 day cycle started 11/09/2014: last dose 03/12/2015 (6 cycles)  (a) PET scan 01/05/2015 shows an excellent initial response after 3 cycles  (b) PET scan 03/17/2015 shows progression  (8) zolendronate started 11/16/2014, to be repeated every 12 weeks, most recent dose 02/09/2015  (9) Foundation 1 study sent   11/06/2014: patient's sister in law Garvin Fila following [phone])  (a) 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Flippin, NTRK1, MYC, ARID1A and LYN, none w approved therapies  (b) pazopanib, ponatinib or crizotinib suggested as possible off-protocol options  (10) RADIATION TREATMENTS: Brain metastases:  (a) s/p SRS therapy 12/11/2014 to 4 right cerebellar lesions  (b) multiple (18+) lesions noted on repeat brain MRI 03/16/2015  (11) uncontrolled pain: Started on OxyContin 10 mg twice a day with oxycodone 5 mg as needed 03/26/2015  (12) Right pleural effusion  (a) s/p thoracentesis July 2016 (x2), 03/30/2015  PLAN: Once she starts the OxyContin. Her sat was very good when she presented for days ago so that although her d-dimer was a little elevated, and very nonspecific finding, I am comfortable not proceeding to a CT angiogram at this point.  The fact that her right pleural effusion is so markedly increased also suggest that the shortness of breath she was experiencing really is related to that and not to a clot.  We are setting her up for thoracentesis today. She has not had thoracentesis since July. Shortly after those procedures we started the Abraxane which initially worked very well for her, although more recently there has been evidence of progression.  She is tolerating the whole brain irradiation well. That will be completed November 22. I will see her that same week and by then she will have already been evaluated at [facility]. I am trying to get her seen at [facility] as well at her request. Hopefully we will have a plan in place by that time.  She knows to call if any other problems develop before her next visit here. Chauncey Cruel, MD

## 2015-03-30 NOTE — Procedures (Signed)
US guided therapeutic right thoracentesis performed yielding 900 cc bloody fluid. F/u CXR pending. No immediate complications. The pleural collection was multiseptated and only the above amount of fluid could be removed today.

## 2015-03-30 NOTE — Telephone Encounter (Signed)
Spoke with patient regarding cough and increase SOB.  RN spoke with Dr. [name] who agrees that patient should have a CXR prior to her appt with him today.  Patient aware.

## 2015-03-31 ENCOUNTER — Ambulatory Visit: Payer: BLUE CROSS/BLUE SHIELD | Admitting: Radiation Oncology

## 2015-03-31 ENCOUNTER — Ambulatory Visit
Admission: RE | Admit: 2015-03-31 | Discharge: 2015-03-31 | Disposition: A | Discharge status: Home / Self Care | Payer: BLUE CROSS/BLUE SHIELD | Source: Ambulatory Visit | Attending: Radiation Oncology | Admitting: Radiation Oncology

## 2015-03-31 ENCOUNTER — Ambulatory Visit: Payer: BLUE CROSS/BLUE SHIELD

## 2015-03-31 DIAGNOSIS — Z510 Encounter for antineoplastic radiation therapy: Secondary | ICD-10-CM | POA: Diagnosis not present

## 2015-04-01 ENCOUNTER — Other Ambulatory Visit: Payer: Self-pay | Admitting: Oncology

## 2015-04-01 ENCOUNTER — Ambulatory Visit
Admission: RE | Admit: 2015-04-01 | Discharge: 2015-04-01 | Disposition: A | Discharge status: Home / Self Care | Payer: BLUE CROSS/BLUE SHIELD | Source: Ambulatory Visit | Attending: Radiation Oncology | Admitting: Radiation Oncology

## 2015-04-01 ENCOUNTER — Telehealth: Payer: Self-pay | Admitting: Oncology

## 2015-04-01 ENCOUNTER — Telehealth: Payer: Self-pay

## 2015-04-01 ENCOUNTER — Ambulatory Visit: Payer: BLUE CROSS/BLUE SHIELD

## 2015-04-01 DIAGNOSIS — Z510 Encounter for antineoplastic radiation therapy: Secondary | ICD-10-CM | POA: Diagnosis not present

## 2015-04-01 NOTE — Progress Notes (Unsigned)
From Dr Wetzel Bjornstad' 11/10 note  Gus - I saw your nice patient, Nicole Valentine, today. 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  I believe she would be eligible for the parent study as she has treated brain mets, as opposed to the progressive brain mets cohort. I would recommend this now as the current trial iteration only allows 0-1 prior lines of therapy. I have asked them to relax this in the brain mets cohort, but for now, this is what we have. I have cc'd Shawn Stall as he hopes to have this study open in [place], [place] ([address]) in the next few weeks. We are likely at least 6 weeks out at [facility].  Let Merry Proud and me know if you'd like to consider the study and make a referral.  Other options might include eribulin now. Could also consider [place] 1525 w/ pembro/cytoxan in the future. We also have the MM398 study if her brain mets progress.   So, several options. Happy to discuss by phone if easier.

## 2015-04-01 NOTE — Telephone Encounter (Signed)
Caryl Pina, new patient co-ordinator in the breast center at [facility] was returning writers call from yesterday regarding an appt Dr. [name] is trying to get patient.  Writer LVM requesting a call back.

## 2015-04-01 NOTE — Telephone Encounter (Signed)
Left message for new patient coordinator to return call [phone] ex. [phone]

## 2015-04-02 ENCOUNTER — Ambulatory Visit
Admission: RE | Admit: 2015-04-02 | Discharge: 2015-04-02 | Disposition: A | Discharge status: Home / Self Care | Payer: BLUE CROSS/BLUE SHIELD | Source: Ambulatory Visit | Attending: Radiation Oncology | Admitting: Radiation Oncology

## 2015-04-02 ENCOUNTER — Ambulatory Visit: Payer: BLUE CROSS/BLUE SHIELD

## 2015-04-02 DIAGNOSIS — Z510 Encounter for antineoplastic radiation therapy: Secondary | ICD-10-CM | POA: Diagnosis not present

## 2015-04-05 ENCOUNTER — Ambulatory Visit
Admission: RE | Admit: 2015-04-05 | Discharge: 2015-04-05 | Disposition: A | Discharge status: Home / Self Care | Payer: BLUE CROSS/BLUE SHIELD | Source: Ambulatory Visit | Attending: Radiation Oncology | Admitting: Radiation Oncology

## 2015-04-05 ENCOUNTER — Encounter: Payer: Self-pay | Admitting: Radiation Oncology

## 2015-04-05 ENCOUNTER — Ambulatory Visit: Payer: BLUE CROSS/BLUE SHIELD

## 2015-04-05 ENCOUNTER — Ambulatory Visit (INDEPENDENT_AMBULATORY_CARE_PROVIDER_SITE_OTHER): Payer: BLUE CROSS/BLUE SHIELD | Admitting: Allergy and Immunology

## 2015-04-05 ENCOUNTER — Other Ambulatory Visit (HOSPITAL_COMMUNITY): Payer: BLUE CROSS/BLUE SHIELD

## 2015-04-05 ENCOUNTER — Encounter: Payer: Self-pay | Admitting: Allergy and Immunology

## 2015-04-05 VITALS — BP 120/68 | HR 74 | Temp 98.0°F | Resp 18 | Ht 67.72 in | Wt 163.8 lb

## 2015-04-05 VITALS — BP 128/75 | HR 90 | Temp 98.6°F | Ht 67.0 in | Wt 162.8 lb

## 2015-04-05 DIAGNOSIS — R059 Cough, unspecified: Secondary | ICD-10-CM

## 2015-04-05 DIAGNOSIS — R05 Cough: Secondary | ICD-10-CM | POA: Diagnosis not present

## 2015-04-05 DIAGNOSIS — J310 Chronic rhinitis: Secondary | ICD-10-CM

## 2015-04-05 DIAGNOSIS — Z91018 Allergy to other foods: Secondary | ICD-10-CM | POA: Diagnosis not present

## 2015-04-05 DIAGNOSIS — C7931 Secondary malignant neoplasm of brain: Secondary | ICD-10-CM

## 2015-04-05 DIAGNOSIS — C7951 Secondary malignant neoplasm of bone: Secondary | ICD-10-CM

## 2015-04-05 DIAGNOSIS — Z510 Encounter for antineoplastic radiation therapy: Secondary | ICD-10-CM | POA: Diagnosis not present

## 2015-04-05 DIAGNOSIS — C50411 Malignant neoplasm of upper-outer quadrant of right female breast: Secondary | ICD-10-CM

## 2015-04-05 MED ORDER — CARBINOXAMINE MALEATE ER 4 MG/5ML PO SUER: 10.0000 mL | Freq: Two times a day (BID) | ORAL | Status: DC

## 2015-04-05 MED ORDER — FLUTICASONE PROPIONATE 50 MCG/ACT NA SUSP: 2.0000 | Freq: Every day | NASAL | Status: DC

## 2015-04-05 NOTE — Patient Instructions (Addendum)
Chronic rhinitis Non-allergic rhinitis.  All seasonal and perennial aeroallergen skin tests are negative despite a positive histamine control.  Intranasal steroids and intranasal antihistamines are effective for symptoms associated with non-allergic rhinitis, whereas second generation antihistamines such as cetirizine, loratadine and fexofenadine have been found to be ineffective for this condition.  A prescription has been provided for fluticasone nasal spray, one spray per nostril 1-2 times daily as needed. Proper nasal spray technique has been discussed and demonstrated.  A sample and prescription have been provided for [facility] (cabinoxamine) 8-16 mg twice daily as needed.  Nasal saline lavage as needed has been recommended along with instructions for proper administration.  Coughing The patient's history and physical examination suggest upper airway cough syndrome.    Treatment plan as outlined above.  If the coughing persists or progresses despite this plan, we will evaluate further.  History of food allergy Possible food allergy.  The patient's history suggests tree nut allergy, though today's skin tests were negative despite a positive histamine control.  Food allergen skin testing has excellent negative predictive value however there is still a 5% chance that the allergy exists.  Therefore, we will proceed to open graded oral challenge.  Until the food allergy has been definitively ruled out, the patient is to continue meticulous avoidance and have access to epinephrine autoinjector 2 pack.  A prescription has been provided for epinephrine autoinjector 2 pack.     Return in about 2 months (around 06/05/2015), or if symptoms worsen or fail to improve.

## 2015-04-05 NOTE — Assessment & Plan Note (Addendum)
The patient's history and physical examination suggest upper airway cough syndrome.    Treatment plan as outlined above.  If coughing persists or progresses despite this plan, we will evaluate further.

## 2015-04-05 NOTE — Progress Notes (Signed)
History of present illness: HPI Comments: Nicole Valentine is a 49 y.o. female with a complex medical history who presents today for consultation of rhinitis.  She reports that she had seasonal allergies until she moved from [place] to [place] in January 2009.  Her nasal and sinus symptoms had been relatively quiescent until this past August when she began to experience thick postnasal drainage, throat clearing, coughing, and rhinorrhea. No significant seasonal symptom variation has been noted nor have specific environmental triggers been identified.  She has a history of breast cancer treated with chemotherapy.  Her most recent chemotherapy was from June to October.  She is currently undergoing whole brain radiation therapy and is scheduled to restart chemotherapy in the next 1-2 months.   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  On multiple occasions, she developed a rash around her mouth while consuming almonds.  She did not experience concomitant cardiopulmonary or GI symptoms.   Assessment and plan: Chronic rhinitis Non-allergic rhinitis.  All seasonal and perennial aeroallergen skin tests are negative despite a positive histamine control.  Intranasal steroids and intranasal antihistamines are effective for symptoms associated with non-allergic rhinitis, whereas second generation antihistamines such as cetirizine, loratadine and fexofenadine have been found to be ineffective for this condition.  A prescription has been provided for fluticasone nasal spray, one spray per nostril 1-2 times daily as needed. Proper nasal spray technique has been discussed and demonstrated.  Guaifenesin 1200 mg twice daily as needed with adequate hydration as discussed.   Nasal saline lavage as needed has been recommended along with instructions for proper administration.  Coughing The patient's history and physical examination suggest upper airway cough syndrome.    Treatment plan as  outlined above.  If coughing persists or progresses despite this plan, we will evaluate further.  History of food allergy Possible food allergy.  The patient's history suggests tree nut allergy, though today's skin tests were negative despite a positive histamine control.  Food allergen skin testing has excellent negative predictive value however there is still a 5% chance that the allergy exists.  Therefore, we will proceed to open graded oral challenge.  Until the food allergy has been definitively ruled out, the patient is to continue meticulous avoidance and have access to epinephrine autoinjector 2 pack.  A prescription has been provided for epinephrine autoinjector 2 pack.    Medications ordered this encounter: Meds ordered this encounter  Medications  . fluticasone (FLONASE) 50 MCG/ACT nasal spray    Sig: Place 2 sprays into both nostrils daily.    Dispense:  1 g    Refill:  5  . Carbinoxamine Maleate ER [facility] ER) 4 MG/5ML SUER    Sig: Take 10-15 mLs by mouth 2 (two) times daily.    Dispense:  1 Bottle    Refill:  3    Diagnositics: Spirometry: Restrictive pattern due to pulmonary effusion. Environmental skin testing: Negative despite a positive histamine control. Food allergen skin testing: Negative despite a positive histamine control.    Physical examination: Blood pressure 120/68, pulse 74, temperature 98 F (36.7 C), temperature source Oral, resp. rate 18, height 5' 7.72" (1.72 m), weight 163 lb 12.8 oz (74.3 kg), last menstrual period 03/29/2012.  General: Alert, interactive, in no acute distress. HEENT: TMs pearly gray, turbinates moderately edematous without discharge, post-pharynx erythematous. Neck: Supple without lymphadenopathy. Lungs: Decreased breath sounds on the right, clear to auscultation on the left. CV: Normal S1, S2 without murmurs. Abdomen: Nondistended, nontender.  Skin: Warm and dry, without lesions or rashes. Extremities:  No clubbing,  cyanosis or edema. Neuro:   Grossly intact.  Review of systems: Review of Systems  Constitutional: Negative for fever and chills.  HENT: Negative for nosebleeds.   Eyes: Negative for blurred vision.  Respiratory: Negative for hemoptysis.   Cardiovascular: Negative for chest pain.  Gastrointestinal: Negative for diarrhea and constipation.  Genitourinary: Negative for dysuria.  Musculoskeletal: Negative for myalgias and joint pain.  Skin: Negative for rash.  Neurological: Negative for dizziness and headaches.  Endo/Heme/Allergies: Does not bruise/bleed easily.    Past medical history: Past Medical History  Diagnosis Date  . Complication of anesthesia     her father and uncle both had very difficult time waking up-was  something they told them may be hereditory. she will find out.  [name] Hypothyroidism   . 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 Radiation 01/21/13-03/06/13    Right Breast  . Breast cancer ([place]) dx'd 04-10-12-rt    Past surgical history: Past Surgical History  Procedure Laterality Date  . Wisdom tooth extraction    . Portacath placement  04/22/2012    Procedure: INSERTION PORT-A-CATH;  Surgeon: [name], MD;  Location: [facility];  Service: General;  Laterality: Left;  [name] Modified mastectomy Right 11/18/2012    Procedure:  RIGHT MODIFIED MASTECTOMY;  Surgeon: [name], MD;  Location: [place];  Service: General;  Laterality: Right;  . Knee arthroscopy Right 11/26/2013  . Port-a-cath removal Left 12/10/2013    Procedure: MINOR REMOVAL PORT-A-CATH;  Surgeon: [name], MD;  Location: [place];  Service: General;  Laterality: Left;    Family history: Family History  Problem Relation Age of Onset  . Lung cancer Maternal Grandfather 39    smoker  . Prostate cancer Maternal Grandfather 90  . Throat cancer Other     Great Aunt x 2  . Liver cancer Other     Maternal Great Grandmother  . Melanoma  Maternal Uncle 81  . Brain cancer Cousin 11    non-malignant  . Allergic rhinitis Neg Hx   . Asthma Neg Hx   . Eczema Neg Hx   . Urticaria Neg Hx     Social history: Social History   Social History  . Marital Status: Married    Spouse Name: N/A  . Number of Children: N/A  . Years of Education: N/A   Occupational History  . Not on file.   Social History Main Topics  . Smoking status: Never Smoker   . Smokeless tobacco: Never Used  . Alcohol Use: No  . Drug Use: No  . Sexual Activity: Yes   Other Topics Concern  . Not on file   Social History Narrative   Environmental History:  Jolisha lives with in a 49 year old house with carpeting in the bedroom and central air/heat.  There are 2 cats and one dog in house, the dog has access to her bedroom.  She is a nonsmoker.   Known medication allergies: Allergies  Allergen Reactions  . Almond Meal Rash  . Almond Oil Rash  . Other Rash    LOTIONS-unknown type  . Tegaderm Ag Mesh [Silver] Rash  . Vancomycin Rash    Red Man Syndrome    Outpatient medications:   Medication List       This list is accurate as of: 04/05/15 10:59 PM.  Always use your most recent med list.  ALEVE 220 MG tablet  Generic drug:  naproxen sodium  Take 2 tablets (440 mg total) by mouth 2 (two) times daily with a meal.     Carbinoxamine Maleate ER 4 MG/5ML Suer  Commonly known as:  KARBINAL ER  Take 10-15 mLs by mouth 2 (two) times daily.     cholecalciferol 1000 UNITS tablet  Commonly known as:  VITAMIN D  Take 2,000 Units by mouth 2 (two) times daily.     CLARITIN-D 12 HOUR PO  Take 1 tablet by mouth 2 (two) times daily as needed (allergies).     docusate sodium 250 MG capsule  Commonly known as:  COLACE  Take 1 capsule (250 mg total) by mouth daily.     fluticasone 50 MCG/ACT nasal spray  Commonly known as:  FLONASE  Place 2 sprays into both nostrils daily.     levothyroxine 175 MCG tablet  Commonly known as:   SYNTHROID, LEVOTHROID  Take 175 mcg by mouth daily before breakfast.     LORazepam 0.5 MG tablet  Commonly known as:  ATIVAN  Take 1 tablet (0.5 mg total) by mouth at bedtime as needed for sleep.     MIRALAX powder  Generic drug:  polyethylene glycol powder  Take 255 g by mouth once.     omeprazole 40 MG capsule  Commonly known as:  PRILOSEC  Take 1 capsule (40 mg total) by mouth daily.     oxyCODONE 10 mg 12 hr tablet  Commonly known as:  OXYCONTIN  Take 1 tablet (10 mg total) by mouth every 12 (twelve) hours.     oxyCODONE 5 MG immediate release tablet  Commonly known as:  ROXICODONE  Take 1 tablet (5 mg total) by mouth every 4 (four) hours as needed for severe pain.     prochlorperazine 10 MG tablet  Commonly known as:  COMPAZINE  Take 1 tablet (10 mg total) by mouth every 6 (six) hours as needed (Nausea or vomiting).     saccharomyces boulardii 250 MG capsule  Commonly known as:  FLORASTOR  Take 1 capsule (250 mg total) by mouth 2 (two) times daily.        I appreciate the opportunity to take part in this Ernesto's care. Please do not hesitate to contact me with questions.  Sincerely,   R. Edgar Frisk, MD

## 2015-04-05 NOTE — Assessment & Plan Note (Addendum)
Non-allergic rhinitis.  All seasonal and perennial aeroallergen skin tests are negative despite a positive histamine control.  Intranasal steroids and intranasal antihistamines are effective for symptoms associated with non-allergic rhinitis, whereas second generation antihistamines such as cetirizine, loratadine and fexofenadine have been found to be ineffective for this condition.  A prescription has been provided for fluticasone nasal spray, one spray per nostril 1-2 times daily as needed. Proper nasal spray technique has been discussed and demonstrated.  Guaifenesin 1200 mg twice daily as needed with adequate hydration as discussed.   Nasal saline lavage as needed has been recommended along with instructions for proper administration.

## 2015-04-05 NOTE — Progress Notes (Signed)
Ms. Roda is here for her 8th fraction of radiation to her brain. She reports fatigue, and reports feeling tired at night. She is having difficulty sleeping related to sinus congestion, and plans to see her allergist today. She denies nausea, and reports she is trying to eat well. She reports nothing tastes bad, but nothing is appealing either. She denies any other problems at this time.   BP 128/75 mmHg  Pulse 90  Temp(Src) 98.6 F (37 C)  Ht 5\' 7"  (1.702 m)  Wt 162 lb 12.8 oz (73.846 kg)  BMI 25.49 kg/m2  LMP 03/29/2012   Wt Readings from Last 3 Encounters:  04/05/15 162 lb 12.8 oz (73.846 kg)  03/30/15 167 lb 9.6 oz (76.023 kg)  03/29/15 169 lb 12.8 oz (77.021 kg)

## 2015-04-05 NOTE — Progress Notes (Signed)
    Weekly Management Note:  Outpatient    ICD-9-CM ICD-10-CM   1. Brain metastases (HCC) 198.3 C79.31     Current Dose:   20 Gy  Projected Dose: 35 Gy  Whole brain RT  Narrative:  The patient presents for routine under treatment assessment.  CBCT/MVCT images/Port film x-rays were reviewed.  The chart was checked. Poor appetite, fatigue. Waiting for [facility] appt, no response. Saw [facility] med/onc... Unsure if that trial is worth enrolling on. Not on Decadron.  Physical Findings:  height is 5\' 7"  (1.702 m) and weight is 162 lb 12.8 oz (73.846 kg). Her temperature is 98.6 F (37 C). Her blood pressure is 128/75 and her pulse is 90.   Wt Readings from Last 3 Encounters:  04/05/15 162 lb 12.8 oz (73.846 kg)  03/30/15 167 lb 9.6 oz (76.023 kg)  03/29/15 169 lb 12.8 oz (77.021 kg)   NAD  ambulatory  Impression:  The patient is tolerating radiotherapy.  Plan:  Continue radiotherapy as planned.  She is interested in other options for clinical trials;  I will  refer  to [facility] med/onc in [place]. ________________________________   Eppie Gibson, M.D.

## 2015-04-05 NOTE — Assessment & Plan Note (Addendum)
Possible food allergy.  The patient's history suggests tree nut allergy, though today's skin tests were negative despite a positive histamine control.  Food allergen skin testing has excellent negative predictive value however there is still a 5% chance that the allergy exists.  Therefore, we will proceed to open graded oral challenge.  Until the food allergy has been definitively ruled out, the patient is to continue meticulous avoidance and have access to epinephrine autoinjector 2 pack.  A prescription has been provided for epinephrine autoinjector 2 pack.

## 2015-04-06 ENCOUNTER — Other Ambulatory Visit: Payer: Self-pay

## 2015-04-06 ENCOUNTER — Ambulatory Visit: Payer: BLUE CROSS/BLUE SHIELD

## 2015-04-06 ENCOUNTER — Other Ambulatory Visit: Payer: Self-pay | Admitting: Oncology

## 2015-04-06 ENCOUNTER — Other Ambulatory Visit: Payer: Self-pay | Admitting: *Deleted

## 2015-04-06 ENCOUNTER — Ambulatory Visit: Payer: Self-pay | Admitting: Allergy and Immunology

## 2015-04-06 ENCOUNTER — Telehealth: Payer: Self-pay | Admitting: *Deleted

## 2015-04-06 ENCOUNTER — Ambulatory Visit: Payer: BLUE CROSS/BLUE SHIELD | Admitting: Oncology

## 2015-04-06 ENCOUNTER — Ambulatory Visit
Admission: RE | Admit: 2015-04-06 | Discharge: 2015-04-06 | Disposition: A | Discharge status: Home / Self Care | Payer: BLUE CROSS/BLUE SHIELD | Source: Ambulatory Visit | Attending: Radiation Oncology | Admitting: Radiation Oncology

## 2015-04-06 ENCOUNTER — Telehealth: Payer: Self-pay | Admitting: Oncology

## 2015-04-06 ENCOUNTER — Telehealth: Payer: Self-pay | Admitting: Allergy and Immunology

## 2015-04-06 DIAGNOSIS — C7931 Secondary malignant neoplasm of brain: Secondary | ICD-10-CM | POA: Diagnosis present

## 2015-04-06 DIAGNOSIS — Z510 Encounter for antineoplastic radiation therapy: Secondary | ICD-10-CM | POA: Diagnosis present

## 2015-04-06 MED ORDER — CARBINOXAMINE MALEATE ER 4 MG/5ML PO SUER: 8.0000 mg | Freq: Two times a day (BID) | ORAL | Status: DC | PRN

## 2015-04-06 NOTE — Progress Notes (Signed)
Writer tried several times to make an appt with Dr. [name] at [facility].  Patient called also without any success.  Writer called patient back to let her know this.  Patient anxious to speak with Dr. [name] about plan after radiation is complete 04/13/15.  Dr. [name] will see patient this Friday 04/09/15.  POF placed.

## 2015-04-06 NOTE — Telephone Encounter (Signed)
Rx sent and patient informed.  

## 2015-04-06 NOTE — Telephone Encounter (Signed)
Dr. Verlin Fester, Pt called in about the RX change from yesterday. You changed the one med to the 1200 mg of something.(sorry) She wants you to know that she is no longer on the Potassium and would like to be on the original Medication you prescribed. Pharm is [facility] on [address] pls advise

## 2015-04-06 NOTE — Telephone Encounter (Signed)
Appointments made and patient has been called and aware  anne

## 2015-04-06 NOTE — Telephone Encounter (Signed)
Please call in a prescription for [facility] ER, 8-16 mg by mouth twice a day as needed.  Please inform the patient when this has been done.  Thank you.

## 2015-04-06 NOTE — Telephone Encounter (Signed)
CALLED PATIENT TO INFORM OF APPT. TO SEE DR. [place] ON 04-22-15- ARRIVAL TIME - 1:45 PM FOR TRIALS FOR MET. BREAST CANCER, LVM FOR  A RETURN CALL

## 2015-04-06 NOTE — Telephone Encounter (Signed)
This RX was sent per Dr. Mariane Masters instructions.

## 2015-04-07 ENCOUNTER — Other Ambulatory Visit: Payer: Self-pay | Admitting: *Deleted

## 2015-04-07 ENCOUNTER — Ambulatory Visit
Admission: RE | Admit: 2015-04-07 | Discharge: 2015-04-07 | Disposition: A | Discharge status: Home / Self Care | Payer: BLUE CROSS/BLUE SHIELD | Source: Ambulatory Visit | Attending: Radiation Oncology | Admitting: Radiation Oncology

## 2015-04-07 ENCOUNTER — Ambulatory Visit: Payer: BLUE CROSS/BLUE SHIELD

## 2015-04-07 DIAGNOSIS — Z510 Encounter for antineoplastic radiation therapy: Secondary | ICD-10-CM | POA: Diagnosis not present

## 2015-04-07 MED ORDER — CARBINOXAMINE MALEATE ER 4 MG/5ML PO SUER: 8.0000 mg | Freq: Two times a day (BID) | ORAL | Status: DC | PRN

## 2015-04-07 NOTE — Telephone Encounter (Signed)
Clarise Cruz from [place] called to advise she had a note to call us regarding [name] and a drug interaction (potassium).  She states the patient is no longer taking potassium.  The prescription for Nicole Valentine was discontinued and if patient is supposed to take it will have to be sent in again.  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

## 2015-04-07 NOTE — Telephone Encounter (Signed)
Medication has been resent to the pharmacy.  

## 2015-04-08 ENCOUNTER — Ambulatory Visit
Admission: RE | Admit: 2015-04-08 | Discharge: 2015-04-08 | Disposition: A | Discharge status: Home / Self Care | Payer: BLUE CROSS/BLUE SHIELD | Source: Ambulatory Visit | Attending: Radiation Oncology | Admitting: Radiation Oncology

## 2015-04-08 ENCOUNTER — Ambulatory Visit: Payer: BLUE CROSS/BLUE SHIELD

## 2015-04-08 DIAGNOSIS — Z510 Encounter for antineoplastic radiation therapy: Secondary | ICD-10-CM | POA: Diagnosis not present

## 2015-04-09 ENCOUNTER — Ambulatory Visit (HOSPITAL_BASED_OUTPATIENT_CLINIC_OR_DEPARTMENT_OTHER): Payer: BLUE CROSS/BLUE SHIELD | Admitting: Oncology

## 2015-04-09 ENCOUNTER — Ambulatory Visit: Payer: BLUE CROSS/BLUE SHIELD

## 2015-04-09 ENCOUNTER — Ambulatory Visit
Admission: RE | Admit: 2015-04-09 | Discharge: 2015-04-09 | Disposition: A | Discharge status: Home / Self Care | Payer: BLUE CROSS/BLUE SHIELD | Source: Ambulatory Visit | Attending: Radiation Oncology | Admitting: Radiation Oncology

## 2015-04-09 VITALS — BP 125/79 | HR 91 | Temp 98.1°F | Resp 17 | Ht 67.72 in | Wt 162.2 lb

## 2015-04-09 DIAGNOSIS — C50411 Malignant neoplasm of upper-outer quadrant of right female breast: Secondary | ICD-10-CM

## 2015-04-09 DIAGNOSIS — C50919 Malignant neoplasm of unspecified site of unspecified female breast: Secondary | ICD-10-CM

## 2015-04-09 DIAGNOSIS — C7951 Secondary malignant neoplasm of bone: Principal | ICD-10-CM

## 2015-04-09 DIAGNOSIS — Z510 Encounter for antineoplastic radiation therapy: Secondary | ICD-10-CM | POA: Diagnosis not present

## 2015-04-09 MED ORDER — PROCHLORPERAZINE MALEATE 10 MG PO TABS: ORAL_TABLET | ORAL | Status: DC

## 2015-04-09 MED ORDER — LIDOCAINE-PRILOCAINE 2.5-2.5 % EX CREA: TOPICAL_CREAM | CUTANEOUS | Status: DC

## 2015-04-09 MED ORDER — DEXAMETHASONE 4 MG PO TABS: ORAL_TABLET | ORAL | Status: DC

## 2015-04-09 MED ORDER — LORAZEPAM 0.5 MG PO TABS: 0.5000 mg | ORAL_TABLET | Freq: Four times a day (QID) | ORAL | Status: DC | PRN

## 2015-04-09 MED ORDER — ONDANSETRON HCL 8 MG PO TABS: 8.0000 mg | ORAL_TABLET | Freq: Two times a day (BID) | ORAL | Status: DC | PRN

## 2015-04-09 NOTE — Progress Notes (Signed)
And a Patient ID: Nicole Valentine, female   DOB: 1965-10-24, 49 y.o.   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 She had a mammogram performed that showed dense breasts bilaterally. Ultrasound of the right breast showed 2.3 x 1.9 cm area of abnormality with multiple abnormal lymph nodes. MRI of the bilateral breasts showed asymmetrical enhancement throughout the right breast consistent with multicentric disease. An ultrasound-guided biopsy performed. The known area of disease measured 1.9 x 1.9 x 2.3 cm. Multiple abnormal positive right axillary lymph nodes were noted. 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 Biopsy of the right axillary lymph node was positive for malignancy with extracapsular extension. Patient was originally seen by Dr. Margot Valentine Dr. Truddie Valentine and Dr. Pablo Valentine. She has elected to have a right mastectomy eventually and declined biopsies of any other areas within the breast."  She went on to receive neoadjuvant chemotherapy and attained a complete pathologic response, as documented below  METASTATIC DISEASE: From the prior summary note: Nicole Valentine noted a very small right supraclavicular mass 09/25/2014, which she brought to our attention. She was having some allergy symptoms at the time so we decided to reevaluate this after a few weeks and on 10/22/2014 as the mass persisted we obtained a restaging neck CT scan, 10/28/2014. There was bilateral supraclavicular adenopathy, but also a large right pleural  effusion was incidentally noted. We proceeded to right thoracentesis 10/29/2014, and a liter of hazy yellow fluid was removed. Cytology from this procedure ([id number]) showed malignant cells consistent with adenocarcinoma, estrogen and progesterone receptor negative, HER-2 not amplified, with a signals ratio 1.12 and the number per cell being 2.75, and with an MIB-1 of 80%. I called Nicole Valentine with these results and set her up for a PET scan performed 11/06/2014, which shows widespread metastatic disease involving particularly the right lung, liver, and bones, but also the left lung, right adrenal, and multiple lymph node areas.  Her subsequent history is as detailed below.  INTERVAL HISTORY: Nicole Valentine returns today for follow-up of her metastatic breast cancer, accompanied by her husband Nicole Valentine. Since the last visit here she has been receiving whole brain irradiation which will and next week. She is generally tolerating this well. She does get some swelling of the soft tissues of the head, so that her weight fits well in the morning but not after the treatments. This slightly distorts her features. She denies headaches or visual changes. She admits to being fatigued.  Since the last visit here she also saw Dr. Wetzel Valentine at [facility] who suggested consideration of the cisplatin/veliparib trial versus eribulin. She also suggested that if there are further brain metastases the MM398 trial at [facility] could be considered (this uses lipid encapsulation to allow active agents to cross the blood-brain barrier). An appointment with Dr. Juanita Valentine at [facility] could not be scheduled both for 05/05/2015.  REVIEW OF S/YSTEMS: Nicole Valentine has felt a little bit more short of breath lately. She feels her cough is a little better though. She has pain in the right shoulder and back. For the right shoulder  she really doesn't take anything but if it's that she takes Aleve. She sleeps propped up in bed. Claritin helps. She was somewhat constipated but  is using prune juice to relieve that problem. Otherwise a detailed review of systems today was stable  PAST MEDICAL HISTORY: Past Medical History  Diagnosis Date  . Complication of anesthesia     her father and uncle both had very difficult time waking up-was  something they told them may be hereditory. she will find out.  [name] Hypothyroidism   . 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 Radiation 01/21/13-03/06/13    Right Breast  . Breast cancer ([place]) dx'd 04-10-12-rt    PAST SURGICAL HISTORY: Past Surgical History  Procedure Laterality Date  . Wisdom tooth extraction    . Portacath placement  04/22/2012    Procedure: INSERTION PORT-A-CATH;  Surgeon: [name], MD;  Location: [place];  Service: General;  Laterality: Left;  [name] Modified mastectomy Right 11/18/2012    Procedure:  RIGHT MODIFIED MASTECTOMY;  Surgeon: [name], MD;  Location: [place];  Service: General;  Laterality: Right;  . Knee arthroscopy Right 11/26/2013  . Port-a-cath removal Left 12/10/2013    Procedure: MINOR REMOVAL PORT-A-CATH;  Surgeon: [name], MD;  Location: [place];  Service: General;  Laterality: Left;    FAMILY HISTORY Family History  Problem Relation Age of Onset  . Lung cancer Maternal Grandfather 21    smoker  . Prostate cancer Maternal Grandfather 90  . Throat cancer Other     Great Aunt x 2  . Liver cancer Other     Maternal Great Grandmother  . Melanoma Maternal Uncle 81  . Brain cancer Cousin 11    non-malignant  . Allergic rhinitis Neg Hx   . Asthma Neg Hx   . Eczema Neg Hx   . Urticaria Neg Hx   the patient's parents are living, in their early 25s. The patient has 2 brothers, no sisters. The patient's mother was diagnosed with breast cancer, HER-2 positive, in 2014 in [place].  GYNECOLOGIC HISTORY:   (Updated 10/03/2013) Menarche age 31, first live birth age 57. She is GX P4. LMP January 2014. 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  The children are currently ages 87, 52, 52, and 62. Her husband, Nicole Valentine, is a Customer service manager.He just started a job for [employer].  They attend the [employer] DIRECTIVES: The patient's husband is her HCPOA   HEALTH MAINTENANCE:  (Updated 10/03/2013) Social History  Substance Use Topics  . Smoking status: Never Smoker   . Smokeless tobacco: Never Used  . Alcohol Use: No     Colonoscopy: Never  PAP: November 2013/Dr. Smith  Bone density: January 2015, [facility], normal  Lipid panel:  Not on file  Allergies  Allergen Reactions  . Almond Meal Rash  . Almond Oil Rash  . Other Rash    LOTIONS-unknown type  . Tegaderm Ag Mesh [Silver] Rash  . Vancomycin Rash    Red Man Syndrome    Current Outpatient Prescriptions  Medication Sig Dispense Refill  . Carbinoxamine Maleate ER [facility] ER) 4 MG/5ML SUER Take 8-16 mg by mouth 2 (two) times daily as needed. 480 mL 5  . cholecalciferol (VITAMIN D) 1000 UNITS tablet Take 2,000 Units by mouth 2 (two) times  daily.     . docusate sodium (COLACE) 250 MG capsule Take 1 capsule (250 mg total) by mouth daily. 10 capsule 0  . fluticasone (FLONASE) 50 MCG/ACT nasal spray Place 2 sprays into both nostrils daily. 1 g 5  . levothyroxine (SYNTHROID, LEVOTHROID) 175 MCG tablet Take 175 mcg by mouth daily before breakfast.    . Loratadine-Pseudoephedrine (CLARITIN-D 12 HOUR PO) Take 1 tablet by mouth 2 (two) times daily as needed (allergies).    . LORazepam (ATIVAN) 0.5 MG tablet Take 1 tablet (0.5 mg total) by mouth at bedtime as needed for sleep. 30 tablet 0  . naproxen sodium (ALEVE) 220 MG tablet Take 2 tablets (440 mg total) by mouth 2 (two) times daily with a meal.    . omeprazole (PRILOSEC) 40 MG capsule Take 1 capsule (40 mg total) by mouth daily. (Patient not taking: Reported on 04/05/2015) 30 capsule 6  .  oxyCODONE (OXYCONTIN) 10 mg 12 hr tablet Take 1 tablet (10 mg total) by mouth every 12 (twelve) hours. 60 tablet 0  . oxyCODONE (ROXICODONE) 5 MG immediate release tablet Take 1 tablet (5 mg total) by mouth every 4 (four) hours as needed for severe pain. 30 tablet 0  . polyethylene glycol powder (MIRALAX) powder Take 255 g by mouth once. 1 g 0  . saccharomyces boulardii (FLORASTOR) 250 MG capsule Take 1 capsule (250 mg total) by mouth 2 (two) times daily. 60 capsule 2   No current facility-administered medications for this visit.      OBJECTIVE: Middle-aged white woman who appears stated age 65 Vitals:   04/09/15 1549  BP: 125/79  Pulse: 91  Temp: 98.1 F (36.7 C)  Resp: 17     Body mass index is 24.87 kg/(m^2).    ECOG FS:1 - Symptomatic but completely ambulatory Filed Weights   04/09/15 1549  Weight: 162 lb 3.2 oz (73.573 kg)    Sclerae unicteric, pupils round and equal, extra ocular movements full  Oropharynx clear and moist-- no thrush or other lesions No cervical or supraclavicular adenopathy Lungs no rales or rhonchi, or dullness to percussion right base Heart regular rate and rhythm Abd soft, nontender, positive bowel sounds MSK no focal spinal tenderness, grade 1 right upper extremity lymphedema Neuro: nonfocal, well oriented, stoical affect Breasts: Deferred    LAB RESULTS:   Lab Results  Component Value Date   WBC 7.7 03/26/2015   NEUTROABS 6.9* 03/26/2015   HGB 11.2* 03/26/2015   HCT 33.6* 03/26/2015   MCV 104.2* 03/26/2015   PLT 253 03/26/2015      Chemistry      Component Value Date/Time   NA 142 03/22/2015 0802   NA 138 02/25/2015 0203   K 4.0 03/22/2015 0802   K 3.4* 02/25/2015 0203   CL 105 02/25/2015 0203   CL 101 10/18/2012 0859   CO2 29 03/22/2015 0802   CO2 25 02/25/2015 0203   BUN 5.6* 03/22/2015 0802   BUN 7 02/25/2015 0203   CREATININE 0.7 03/22/2015 0802   CREATININE 0.54 02/25/2015 0203      Component Value Date/Time    CALCIUM 9.0 03/22/2015 0802   CALCIUM 8.3* 02/25/2015 0203   ALKPHOS 142 03/22/2015 0802   ALKPHOS 94 11/17/2014 2205   AST 15 03/22/2015 0802   AST 22 11/17/2014 2205   ALT 11 03/22/2015 0802   ALT 20 11/17/2014 2205   BILITOT 0.42 03/22/2015 0802   BILITOT 0.6 11/17/2014 2205       STUDIES: Dg  Chest 1 View  03/30/2015  CLINICAL DATA:  Patient status post right thoracentesis. History shortness of breath. EXAM: CHEST 1 VIEW COMPARISON:  Chest radiograph 03/30/2015 FINDINGS: Left anterior chest wall Port-A-Cath is present with tip projecting over the superior vena cava. Stable cardiac and mediastinal contours. Unchanged moderate to large right pleural effusion with underlying pulmonary consolidation. Possible tiny left pleural effusion. Minimal atelectasis left lung base. No definite pneumothorax. Right chest wall surgical clips. IMPRESSION: Re- demonstrated moderate to large right pleural effusion with underlying airspace opacification. Electronically Signed   By: Lovey Newcomer M.D.   On: 03/30/2015 15:38   Dg Chest 2 View  03/30/2015  CLINICAL DATA:  Increase in shortness of breath and secretions. Cough. Metastatic breast cancer. EXAM: CHEST - 2 VIEW COMPARISON:  PET scan 03/17/2015.  CT chest 03/03/2015. FINDINGS: The heart size is normal. A left IJ Port-A-Cath is noted. The right pleural effusion has significantly increased encompassing over half hemi thorax. The inferior right hemithorax is completely opacified. The left lung is clear. There is no significant left effusion. IMPRESSION: 1. Progressive large right pleural effusion obscuring underlying airspace disease and tumor. 2. Stable Port-A-Cath. These results will be called to the ordering clinician or representative by the Radiologist Assistant, and communication documented in the PACS or zVision Dashboard. Electronically Signed   By: San Morelle M.D.   On: 03/30/2015 12:58   Mr Jeri Cos XN Contrast  03/16/2015  CLINICAL DATA:   49 year old female with metastatic breast cancer. Status post SRS to for cerebellar metastases in July. Also status post thoracic spine palliative radiation. Subsequent encounter. EXAM: MRI HEAD WITHOUT AND WITH CONTRAST TECHNIQUE: Multiplanar, multiecho pulse sequences of the brain and surrounding structures were obtained without and with intravenous contrast. CONTRAST:  59m MULTIHANCE GADOBENATE DIMEGLUMINE 529 MG/ML IV SOLN COMPARISON:  Targeting Brain MRI 12/03/2014 and earlier. FINDINGS: The previously treated 4 small cerebellar metastases have all regressed. However, there are at least 18 new brain metastases scattered in both cerebellar and cerebral hemispheres, ranging from punctate to an 11 mm gyriform metastasis in the left superior frontal gyrus affecting the motor strip (series 10, image 115). The next largest metastasis is 8 mm in the right periatrial white matter extending to the subependyma. Incidental right anterior frontal lobe developmental venous anomaly re - demonstrated on series 10, image 103. Minimal to mild associated edema with these new lesions. No mass effect. No acute intracranial hemorrhage identified. No ventriculomegaly. No pachymeningeal thickening identified. Major intracranial vascular flow voids are stable. No restricted diffusion or evidence of acute infarction. Negative visualized cervical spinal cord. Bone marrow signal appears mildly improved since July. No destructive calvarium lesion. Stable visualized internal auditory structures, mastoids, paranasal sinuses, orbit soft tissues and scalp soft tissues. IMPRESSION: 1. At least 18 new small brain metastases ranging from punctate to 11 mm. Minimal to mild associated edema with no mass effect. 2. The four treated cerebellar metastases are all essentially resolved. Electronically Signed   By: HGenevie AnnM.D.   On: 03/16/2015 14:17   Nm Pet Image Restag (ps) Skull Base To Thigh  03/17/2015  CLINICAL DATA:  Subsequent treatment  strategy for breast cancer. EXAM: NUCLEAR MEDICINE PET SKULL BASE TO THIGH TECHNIQUE: 7.38 mCi F-18 FDG was injected intravenously. Full-ring PET imaging was performed from the skull base to thigh after the radiotracer. CT data was obtained and used for attenuation correction and anatomic localization. FASTING BLOOD GLUCOSE:  Value: 73 mg/dl COMPARISON:  None. FINDINGS: NECK Hypermetabolic right supraclavicular  lymph node measures 9 mm and has an SUV max equal to 9.25. This is new from previous exam. CHEST Hypermetabolic anterior mediastinal and bilateral hilar lymph node metastases are identified. Index right hilar lymph node has an SUV max equal to 8.21. This is new from previous exam. Abnormal uptake within the left hilar region has an SUV max equal to 7.68. Also new from previous exam. Hypermetabolic tumor in the right middle lobe measures 2.8 cm and has an SUV max equal to 7.05. On the previous exam this measured 3.5 cm and had an SUV max equal to 4.91. ABDOMEN/PELVIS There is a focus of increased uptake within the right hepatic lobe. This measures approximately 1 cm and has an SUV max equal to 5.38. On the previous exam the SUV max associated with this lesion was equal to 4.52. Right adrenal gland metastases noted. Lesion measures 1.4 cm and has an SUV max equal to 9.72. This is new from previous exam. Hypermetabolic gastrohepatic ligament lymph node is new measuring 1.1 cm. This has an SUV max equal to 6.08. There is also a hypermetabolic retrocrural lymph node which measures 1 cm and has an SUV max equal to 8.73. New from previous exam. No abnormal uptake identified within the pancreas or left adrenal gland. There is diffuse uptake throughout the spleen. SKELETON Diffuse an markedly increased uptake is identified throughout the axial and appendicular skeleton on the corresponding CT images there is no aggressive lytic or sclerotic bone lesion. Findings are favored to represent stimulated bone marrow. This  significantly diminishes ability to detect underlying bone metastasis. IMPRESSION: 1. Interval progression of disease compared with 01/05/2015. Specifically, new hypermetabolic right supraclavicular, anterior mediastinal and bilateral hilar lymph node metastasis. There is also new retrocrural and gastrohepatic ligament hypermetabolic lymph nodes. Pulmonary lesion within the right middle lobe demonstrates increase in degree of FDG uptake. Additionally, the lesion within the right lobe of liver demonstrates increase in FDG uptake. New right adrenal gland metastasis is also noted. 2. Diffuse uptake throughout the skeleton as well as the spleen is noted and may reflect treatment related changes. Electronically Signed   By: Kerby Moors M.D.   On: 03/17/2015 14:28   US Thoracentesis Asp Pleural Space W/img Guide  03/30/2015  INDICATION: Metastatic breast cancer, dyspnea, recurrent right pleural effusion. Request is made for therapeutic right thoracentesis. EXAM: ULTRASOUND GUIDED THERAPEUTIC RIGHT THORACENTESIS COMPARISON:  Prior thoracentesis on 10/29/2014 MEDICATIONS: None COMPLICATIONS: None immediate TECHNIQUE: Informed written consent was obtained from the patient after a discussion of the risks, benefits and alternatives to treatment. A timeout was performed prior to the initiation of the procedure. Initial ultrasound scanning demonstrates a moderate right pleural effusion. The lower chest was prepped and draped in the usual sterile fashion. 1% lidocaine was used for local anesthesia. An ultrasound image was saved for documentation purposes. A 6 Fr Safe-T-Centesis catheter was introduced. The thoracentesis was performed. The catheter was removed and a dressing was applied. The patient tolerated the procedure well without immediate post procedural complication. The patient was escorted to have an upright chest radiograph. FINDINGS: A total of approximately 900 cc of bloody fluid was removed. Due to the  multi-septated nature of the pleural collection only the above amount of fluid could be removed today. IMPRESSION: Successful ultrasound-guided therapeutic right sided thoracentesis yielding 900 cc of pleural fluid. Read by: Rowe Robert, PA-C Electronically Signed   By: Jerilynn Mages.  Shick M.D.   On: 03/30/2015 15:42    ASSESSMENT: 49 y.o. BRCA negative [employer]  woman with triple-negative stage IV breast cancer  (1) an status post right breast upper outer quadrant and right axillary lymph node biopsy 04/04/2012, both positive for an invasive ductal carcinoma, grade 3, triple negative, with an MIB-1 of 25%  (2) Treated neoadjuvantly with  (a) fluorouracil, cyclophosphamide, and epirubicin (at 100 mg/M2) x4 completed 06/07/2012  (b) docetaxel (75 mg/M2) for one dose, 06/21/2012, poorly tolerated  (c) carboplatin and gemcitabine given every 21 days for 6 cycles completed 10/11/2012  (3) status post right modified radical mastectomy 11/18/2012 showing a complete pathologic response--all 16 lymph nodes were benign  (a) no plans for reconstructon  (4) adjuvant radiation therapy completed 03/06/2013  METASTATIC DISEASE June 2016 (5) 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, 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  (6) staging PET scan 11/06/2014 showed significant disease in the middle and lower lobes of the Right lung, Right effusion, multiple liver and bone lesions, as well as left lung nodules, a Right adrenal nodule, and widespread hypermetabolic adenopathy  (a) brain MRI 11/16/2014 showed multiple cerebellar lesions  (7) RADIATION TREATMENTS thoracic metastases  (a) 01/28/2015-02/11/2015: Thoracic Spine T8-10, 30 Gy in 10 fractions  (7) abraxane day 1 and day 8 of each 21 day cycle started 11/09/2014: last dose 03/12/2015 (6 cycles)  (a) PET scan 01/05/2015 shows an excellent initial response after 3 cycles  (b) PET scan 03/17/2015 shows progression  (8) zolendronate started  11/16/2014, to be repeated every 12 weeks, most recent dose 02/09/2015  (9) Foundation 1 study sent  11/06/2014: patient's sister in law Garvin Fila following [phone])  (a) 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[place], NTRK1, MYC, ARID1A and LYN, none w approved therapies  (b) pazopanib, ponatinib or crizotinib suggested as possible off-protocol options  (10) RADIATION TREATMENTS: Brain metastases:  (a) s/p SRS therapy 12/11/2014 to 4 right cerebellar lesions  (b) multiple (18+) lesions noted on repeat brain MRI 03/16/2015  (c) whole brain irradiation to be completed 04/13/2015  (11) uncontrolled pain: Started on OxyContin 10 mg twice a day with oxycodone 5 mg as needed 03/26/2015  (12) Right pleural effusion  (a) s/p thoracentesis July 2016 (x2), 03/30/2015  (13) to start cisplatin 04/20/2015, repeated every 21 days  PLAN: We spent approximately one hour going over Aowyn's complex situation. The problem of course is that we are fighting a twofold for, and the treatments that I will give her for systemic disease control will not cross the blood-brain barrier. Accordingly we have to plan treatments in tandem.  She will complete her brain irradiation next week. She will then have Thanksgiving's. She will be ready to start chemotherapy the last week in November, specifically 04/20/2015. We discussed several options. #1 the cisplatin/veliparib trial currently available in [place]. She is not attracted by this partly because of the [place]. each way, which would be hard on her family, and partly because she has a 50% chance of getting cisplatin, and age and she could get here just as well. #2 eribulin. As suggested by Dr. Wetzel Valentine. I think this would be a good choice, and would maintain her quality of life as well as offering her a good chance of response. #3 cisplatin alone. We would consider this because of the activity of platinum compounds in triple negative breast  cancer.  After much thought and discussion and with a good understanding of the possible toxicities, side effects and complications which she would like to do is try the cisplatin. If she has a very hard time with the  first cycle we could always fall back on the eribulin for subsequent cycles..  By the time she has 3 cycles of chemotherapy, she should be just about ready to be restaged regarding the brain. We would obtain CTs of the chest and brain MRI some time 2-1/2 months after starting the chemotherapy. If she has further activity in the central nervous system at that time we could consider the MM398 trial at [facility] or possibly further SRS treatments. If there has been disease progression peripherally we should have the pembrolizumab trial open here by that time and she could enroll ended after 2 prior agents in the metastatic setting.  Nicole Valentine and Broxton do not have any illusions regarding what a difficult situation this is. Today we discussed [employer] and they are considering that for their children.  I am setting her up for a CT scan of the chest just before the start of chemotherapy so we have a good new baseline, but I am also obtaining a chest x-ray early next week since she is experiencing slightly more shortness of breath.  Reiley has a good understanding of the overall plan. She agrees with it. She knows the goal of treatment in her case is control. She will call with any problems that may develop before her next visit here.  Chauncey Cruel, MD   04/09/2015

## 2015-04-12 ENCOUNTER — Telehealth: Payer: Self-pay

## 2015-04-12 ENCOUNTER — Ambulatory Visit: Payer: BLUE CROSS/BLUE SHIELD

## 2015-04-12 ENCOUNTER — Encounter: Payer: Self-pay | Admitting: Radiation Oncology

## 2015-04-12 ENCOUNTER — Other Ambulatory Visit: Payer: Self-pay | Admitting: *Deleted

## 2015-04-12 ENCOUNTER — Ambulatory Visit (HOSPITAL_BASED_OUTPATIENT_CLINIC_OR_DEPARTMENT_OTHER)
Admission: RE | Admit: 2015-04-12 | Discharge: 2015-04-12 | Disposition: A | Discharge status: Home / Self Care | Payer: BLUE CROSS/BLUE SHIELD | Source: Ambulatory Visit | Attending: Radiation Oncology | Admitting: Radiation Oncology

## 2015-04-12 ENCOUNTER — Ambulatory Visit
Admission: RE | Admit: 2015-04-12 | Discharge: 2015-04-12 | Disposition: A | Discharge status: Home / Self Care | Payer: BLUE CROSS/BLUE SHIELD | Source: Ambulatory Visit | Attending: Radiation Oncology | Admitting: Radiation Oncology

## 2015-04-12 ENCOUNTER — Ambulatory Visit (HOSPITAL_COMMUNITY)
Admission: RE | Admit: 2015-04-12 | Discharge: 2015-04-12 | Disposition: A | Discharge status: Home / Self Care | Payer: BLUE CROSS/BLUE SHIELD | Source: Ambulatory Visit | Attending: Oncology | Admitting: Oncology

## 2015-04-12 VITALS — BP 124/78 | HR 80 | Temp 98.3°F | Ht 67.0 in | Wt 163.2 lb

## 2015-04-12 DIAGNOSIS — R0600 Dyspnea, unspecified: Secondary | ICD-10-CM | POA: Diagnosis not present

## 2015-04-12 DIAGNOSIS — J90 Pleural effusion, not elsewhere classified: Secondary | ICD-10-CM | POA: Diagnosis not present

## 2015-04-12 DIAGNOSIS — R0602 Shortness of breath: Secondary | ICD-10-CM | POA: Diagnosis not present

## 2015-04-12 DIAGNOSIS — Z510 Encounter for antineoplastic radiation therapy: Secondary | ICD-10-CM | POA: Diagnosis not present

## 2015-04-12 DIAGNOSIS — C50919 Malignant neoplasm of unspecified site of unspecified female breast: Secondary | ICD-10-CM | POA: Insufficient documentation

## 2015-04-12 DIAGNOSIS — C7931 Secondary malignant neoplasm of brain: Secondary | ICD-10-CM

## 2015-04-12 DIAGNOSIS — C7951 Secondary malignant neoplasm of bone: Secondary | ICD-10-CM | POA: Insufficient documentation

## 2015-04-12 DIAGNOSIS — C50411 Malignant neoplasm of upper-outer quadrant of right female breast: Secondary | ICD-10-CM | POA: Insufficient documentation

## 2015-04-12 LAB — CBC WITH DIFFERENTIAL/PLATELET
BASO%: 0.3 % (ref 0.0–2.0)
BASOS ABS: 0 10*3/uL (ref 0.0–0.1)
EOS%: 2.1 % (ref 0.0–7.0)
Eosinophils Absolute: 0.1 10*3/uL (ref 0.0–0.5)
HEMATOCRIT: 32.7 % — AB (ref 34.8–46.6)
HEMOGLOBIN: 10.5 g/dL — AB (ref 11.6–15.9)
LYMPH#: 0.4 10*3/uL — AB (ref 0.9–3.3)
LYMPH%: 7.1 % — ABNORMAL LOW (ref 14.0–49.7)
MCH: 32.3 pg (ref 25.1–34.0)
MCHC: 32.1 g/dL (ref 31.5–36.0)
MCV: 100.8 fL (ref 79.5–101.0)
MONO#: 0.3 10*3/uL (ref 0.1–0.9)
MONO%: 6.8 % (ref 0.0–14.0)
NEUT#: 4.1 10*3/uL (ref 1.5–6.5)
NEUT%: 83.7 % — ABNORMAL HIGH (ref 38.4–76.8)
Platelets: 357 10*3/uL (ref 145–400)
RBC: 3.25 10*6/uL — ABNORMAL LOW (ref 3.70–5.45)
RDW: 16.9 % — AB (ref 11.2–14.5)
WBC: 4.9 10*3/uL (ref 3.9–10.3)

## 2015-04-12 NOTE — Progress Notes (Signed)
    Weekly Management Note:  Outpatient    ICD-9-CM ICD-10-CM   1. Brain metastases (HCC) 198.3 C79.31 CBC with Differential  2. Dyspnea 786.09 R06.00 CBC with Differential    Current Dose:   32.5 Gy  Projected Dose: 35 Gy  Whole brain RT  Narrative:  The patient presents for routine under treatment assessment.  CBCT/MVCT images/Port film x-rays were reviewed.  The chart was checked. More fatigue.  Increased SOB since last week. CXR ordered by Dr [name] with no major changes.  CT chest next week.  No labs in the last couple weeks. No calf pain.  No chest pain or pleuritic pain.  She is not tachycardic.  Physical Findings:  height is 5\' 7"  (1.702 m) and weight is 163 lb 3.2 oz (74.027 kg). Her temperature is 98.3 F (36.8 C). Her blood pressure is 124/78 and her pulse is 80. Her oxygen saturation is 98%.   Wt Readings from Last 3 Encounters:  04/12/15 163 lb 3.2 oz (74.027 kg)  04/09/15 162 lb 3.2 oz (73.573 kg)  04/05/15 163 lb 12.8 oz (74.3 kg)   NAD  Ambulatory Chest - right base, decreased lung sounds Ext: no edema, no calf tenderness.  RUE lymphedema and compression sleeve  Impression:  The patient is tolerating radiotherapy.  Plan:  Continue radiotherapy as planned. F/u in 85mo.  CBC to rule out significant anemia as cause for SOB.    ________________________________   Eppie Gibson, M.D.

## 2015-04-12 NOTE — Telephone Encounter (Signed)
I called and left a voice mail for Nicole Valentine to let her know her blood counts are relatively stable, and she does not need a transfusion at this time. I asked her to call back if she had any questions.

## 2015-04-12 NOTE — Progress Notes (Signed)
Nicole Valentine is here for her 13th fraction of radiation to her Brain. She reports feeling short of breath with very little activity and had a chest X-ray today to evaluate for fluid. She reports fatigue, and is sleeping during the day after sleeping all night and reports she is still tired. She reports nausea at times when she has a lot of drainage in her throat which makes you gag. She reports eating OK, but states she really doesn't have an appetite and eats about 6 small meals a day.   BP 124/78 mmHg  Pulse 80  Temp(Src) 98.3 F (36.8 C)  Ht 5\' 7"  (1.702 m)  Wt 163 lb 3.2 oz (74.027 kg)  BMI 25.55 kg/m2  SpO2 98%  LMP 03/29/2012   Wt Readings from Last 3 Encounters:  04/12/15 163 lb 3.2 oz (74.027 kg)  04/09/15 162 lb 3.2 oz (73.573 kg)  04/05/15 163 lb 12.8 oz (74.3 kg)

## 2015-04-13 ENCOUNTER — Encounter: Payer: Self-pay | Admitting: Radiation Oncology

## 2015-04-13 ENCOUNTER — Other Ambulatory Visit: Payer: Self-pay | Admitting: Oncology

## 2015-04-13 ENCOUNTER — Ambulatory Visit
Admission: RE | Admit: 2015-04-13 | Discharge: 2015-04-13 | Disposition: A | Discharge status: Home / Self Care | Payer: BLUE CROSS/BLUE SHIELD | Source: Ambulatory Visit | Attending: Radiation Oncology | Admitting: Radiation Oncology

## 2015-04-13 ENCOUNTER — Ambulatory Visit: Payer: BLUE CROSS/BLUE SHIELD

## 2015-04-13 DIAGNOSIS — Z510 Encounter for antineoplastic radiation therapy: Secondary | ICD-10-CM | POA: Diagnosis not present

## 2015-04-14 ENCOUNTER — Ambulatory Visit: Payer: BLUE CROSS/BLUE SHIELD

## 2015-04-14 ENCOUNTER — Other Ambulatory Visit: Payer: Self-pay | Admitting: *Deleted

## 2015-04-14 ENCOUNTER — Telehealth: Payer: Self-pay | Admitting: Oncology

## 2015-04-14 NOTE — Telephone Encounter (Signed)
Called patient and went over her new schedule,advised me that she thought she was going to do another tx that started with an "a",val has been advised and will call the patient

## 2015-04-19 ENCOUNTER — Ambulatory Visit: Payer: BLUE CROSS/BLUE SHIELD

## 2015-04-19 ENCOUNTER — Encounter (HOSPITAL_COMMUNITY): Payer: Self-pay | Admitting: Oncology

## 2015-04-19 ENCOUNTER — Inpatient Hospital Stay (HOSPITAL_COMMUNITY)
Admission: AD | Admit: 2015-04-19 | Discharge: 2015-04-21 | DRG: 394 | Disposition: A | Discharge status: Home / Self Care | Payer: BLUE CROSS/BLUE SHIELD | Source: Ambulatory Visit | Attending: Oncology | Admitting: Oncology

## 2015-04-19 ENCOUNTER — Telehealth: Payer: Self-pay | Admitting: Oncology

## 2015-04-19 ENCOUNTER — Other Ambulatory Visit: Payer: Self-pay

## 2015-04-19 ENCOUNTER — Telehealth: Payer: Self-pay | Admitting: *Deleted

## 2015-04-19 ENCOUNTER — Ambulatory Visit (HOSPITAL_BASED_OUTPATIENT_CLINIC_OR_DEPARTMENT_OTHER): Payer: BLUE CROSS/BLUE SHIELD | Admitting: Oncology

## 2015-04-19 ENCOUNTER — Other Ambulatory Visit: Payer: Self-pay | Admitting: Oncology

## 2015-04-19 ENCOUNTER — Encounter (HOSPITAL_COMMUNITY): Payer: Self-pay

## 2015-04-19 ENCOUNTER — Inpatient Hospital Stay (HOSPITAL_COMMUNITY): Payer: BLUE CROSS/BLUE SHIELD

## 2015-04-19 ENCOUNTER — Ambulatory Visit (HOSPITAL_COMMUNITY)
Admission: RE | Admit: 2015-04-19 | Discharge: 2015-04-19 | Disposition: A | Discharge status: Home / Self Care | Payer: BLUE CROSS/BLUE SHIELD | Source: Ambulatory Visit | Attending: Oncology | Admitting: Oncology

## 2015-04-19 VITALS — BP 137/87 | HR 88 | Temp 98.0°F | Resp 18 | Ht 67.0 in | Wt 163.5 lb

## 2015-04-19 DIAGNOSIS — C50411 Malignant neoplasm of upper-outer quadrant of right female breast: Secondary | ICD-10-CM

## 2015-04-19 DIAGNOSIS — Z853 Personal history of malignant neoplasm of breast: Secondary | ICD-10-CM | POA: Diagnosis not present

## 2015-04-19 DIAGNOSIS — C7931 Secondary malignant neoplasm of brain: Secondary | ICD-10-CM | POA: Diagnosis present

## 2015-04-19 DIAGNOSIS — C7951 Secondary malignant neoplasm of bone: Secondary | ICD-10-CM | POA: Diagnosis present

## 2015-04-19 DIAGNOSIS — K219 Gastro-esophageal reflux disease without esophagitis: Secondary | ICD-10-CM | POA: Diagnosis present

## 2015-04-19 DIAGNOSIS — Z9011 Acquired absence of right breast and nipple: Secondary | ICD-10-CM

## 2015-04-19 DIAGNOSIS — Z803 Family history of malignant neoplasm of breast: Secondary | ICD-10-CM | POA: Diagnosis not present

## 2015-04-19 DIAGNOSIS — Z923 Personal history of irradiation: Secondary | ICD-10-CM | POA: Diagnosis not present

## 2015-04-19 DIAGNOSIS — K6389 Other specified diseases of intestine: Secondary | ICD-10-CM | POA: Diagnosis not present

## 2015-04-19 DIAGNOSIS — J90 Pleural effusion, not elsewhere classified: Secondary | ICD-10-CM | POA: Diagnosis not present

## 2015-04-19 DIAGNOSIS — C50919 Malignant neoplasm of unspecified site of unspecified female breast: Secondary | ICD-10-CM

## 2015-04-19 DIAGNOSIS — E440 Moderate protein-calorie malnutrition: Secondary | ICD-10-CM | POA: Diagnosis present

## 2015-04-19 DIAGNOSIS — C7801 Secondary malignant neoplasm of right lung: Secondary | ICD-10-CM | POA: Diagnosis not present

## 2015-04-19 DIAGNOSIS — E668 Other obesity: Secondary | ICD-10-CM | POA: Diagnosis not present

## 2015-04-19 DIAGNOSIS — Q7959 Other congenital malformations of abdominal wall: Secondary | ICD-10-CM

## 2015-04-19 DIAGNOSIS — Z9221 Personal history of antineoplastic chemotherapy: Secondary | ICD-10-CM

## 2015-04-19 DIAGNOSIS — K668 Other specified disorders of peritoneum: Secondary | ICD-10-CM

## 2015-04-19 DIAGNOSIS — R52 Pain, unspecified: Secondary | ICD-10-CM | POA: Diagnosis present

## 2015-04-19 DIAGNOSIS — E039 Hypothyroidism, unspecified: Secondary | ICD-10-CM | POA: Diagnosis present

## 2015-04-19 DIAGNOSIS — C787 Secondary malignant neoplasm of liver and intrahepatic bile duct: Secondary | ICD-10-CM

## 2015-04-19 DIAGNOSIS — C7802 Secondary malignant neoplasm of left lung: Secondary | ICD-10-CM

## 2015-04-19 DIAGNOSIS — J910 Malignant pleural effusion: Secondary | ICD-10-CM | POA: Diagnosis present

## 2015-04-19 DIAGNOSIS — Z5111 Encounter for antineoplastic chemotherapy: Secondary | ICD-10-CM | POA: Diagnosis not present

## 2015-04-19 DIAGNOSIS — C7971 Secondary malignant neoplasm of right adrenal gland: Secondary | ICD-10-CM

## 2015-04-19 DIAGNOSIS — C778 Secondary and unspecified malignant neoplasm of lymph nodes of multiple regions: Secondary | ICD-10-CM

## 2015-04-19 LAB — CBC WITH DIFFERENTIAL/PLATELET
BASOS PCT: 0 %
Basophils Absolute: 0 10*3/uL (ref 0.0–0.1)
EOS ABS: 0.1 10*3/uL (ref 0.0–0.7)
Eosinophils Relative: 3 %
HCT: 30.6 % — ABNORMAL LOW (ref 36.0–46.0)
HEMOGLOBIN: 9.7 g/dL — AB (ref 12.0–15.0)
Lymphocytes Relative: 11 %
Lymphs Abs: 0.4 10*3/uL — ABNORMAL LOW (ref 0.7–4.0)
MCH: 32.3 pg (ref 26.0–34.0)
MCHC: 31.7 g/dL (ref 30.0–36.0)
MCV: 102 fL — ABNORMAL HIGH (ref 78.0–100.0)
MONO ABS: 0.2 10*3/uL (ref 0.1–1.0)
MONOS PCT: 7 %
NEUTROS ABS: 2.6 10*3/uL (ref 1.7–7.7)
Neutrophils Relative %: 79 %
Platelets: 265 10*3/uL (ref 150–400)
RBC: 3 MIL/uL — ABNORMAL LOW (ref 3.87–5.11)
RDW: 16.1 % — AB (ref 11.5–15.5)
WBC: 3.4 10*3/uL — ABNORMAL LOW (ref 4.0–10.5)

## 2015-04-19 LAB — URINALYSIS, ROUTINE W REFLEX MICROSCOPIC
BILIRUBIN URINE: NEGATIVE
GLUCOSE, UA: NEGATIVE mg/dL
HGB URINE DIPSTICK: NEGATIVE
KETONES UR: NEGATIVE mg/dL
Nitrite: NEGATIVE
PH: 7.5 (ref 5.0–8.0)
Protein, ur: NEGATIVE mg/dL
Specific Gravity, Urine: 1.034 — ABNORMAL HIGH (ref 1.005–1.030)

## 2015-04-19 LAB — COMPREHENSIVE METABOLIC PANEL
ALBUMIN: 2.8 g/dL — AB (ref 3.5–5.0)
ALK PHOS: 105 U/L (ref 38–126)
ALT: 10 U/L — AB (ref 14–54)
ANION GAP: 8 (ref 5–15)
AST: 18 U/L (ref 15–41)
BILIRUBIN TOTAL: 0.7 mg/dL (ref 0.3–1.2)
BUN: 10 mg/dL (ref 6–20)
CALCIUM: 8.5 mg/dL — AB (ref 8.9–10.3)
CO2: 30 mmol/L (ref 22–32)
CREATININE: 0.49 mg/dL (ref 0.44–1.00)
Chloride: 103 mmol/L (ref 101–111)
GFR calc Af Amer: >60 mL/min (ref >60)
GFR calc non Af Amer: >60 mL/min (ref >60)
GLUCOSE: 84 mg/dL (ref 65–99)
Potassium: 3.2 mmol/L — ABNORMAL LOW (ref 3.5–5.1)
SODIUM: 141 mmol/L (ref 135–145)
TOTAL PROTEIN: 6.3 g/dL — AB (ref 6.5–8.1)

## 2015-04-19 LAB — URINE MICROSCOPIC-ADD ON

## 2015-04-19 LAB — PROTIME-INR
INR: 1.28 (ref 0.00–1.49)
PROTHROMBIN TIME: 16.1 s — AB (ref 11.6–15.2)

## 2015-04-19 LAB — TSH: TSH: 3.635 u[IU]/mL (ref 0.350–4.500)

## 2015-04-19 MED ORDER — PROCHLORPERAZINE EDISYLATE 5 MG/ML IJ SOLN: 10.0000 mg | Freq: Four times a day (QID) | INTRAMUSCULAR | Status: DC | PRN

## 2015-04-19 MED ORDER — IOHEXOL 300 MG/ML  SOLN
75.0000 mL | Freq: Once | INTRAMUSCULAR | Status: AC | PRN
  Administered 2015-04-19: 75 mL via INTRAVENOUS

## 2015-04-19 MED ORDER — METRONIDAZOLE 500 MG PO TABS
500.0000 mg | ORAL_TABLET | Freq: Three times a day (TID) | ORAL | Status: DC
  Administered 2015-04-19 – 2015-04-21 (×5): 500 mg via ORAL
  Filled 2015-04-19 (×5): qty 1

## 2015-04-19 MED ORDER — PANTOPRAZOLE SODIUM 40 MG IV SOLR: 40.0000 mg | INTRAVENOUS | Status: DC

## 2015-04-19 MED ORDER — PANTOPRAZOLE SODIUM 40 MG PO TBEC
40.0000 mg | DELAYED_RELEASE_TABLET | Freq: Every day | ORAL | Status: DC
Start: 2015-04-19 — End: 2015-04-21
  Administered 2015-04-19 – 2015-04-20 (×2): 40 mg via ORAL
  Filled 2015-04-19 (×2): qty 1

## 2015-04-19 MED ORDER — ENOXAPARIN SODIUM 40 MG/0.4ML ~~LOC~~ SOLN
40.0000 mg | SUBCUTANEOUS | Status: DC
  Administered 2015-04-19 – 2015-04-20 (×2): 40 mg via SUBCUTANEOUS
  Filled 2015-04-19 (×2): qty 0.4

## 2015-04-19 MED ORDER — LORATADINE 10 MG PO TABS
10.0000 mg | ORAL_TABLET | Freq: Every day | ORAL | Status: DC | PRN
  Filled 2015-04-19: qty 1

## 2015-04-19 MED ORDER — LORATADINE 10 MG PO TABS
5.0000 mg | ORAL_TABLET | Freq: Two times a day (BID) | ORAL | Status: DC
  Administered 2015-04-19 – 2015-04-20 (×3): 5 mg via ORAL
  Filled 2015-04-19 (×4): qty 1

## 2015-04-19 MED ORDER — KETOROLAC TROMETHAMINE 15 MG/ML IJ SOLN
7.5000 mg | Freq: Four times a day (QID) | INTRAMUSCULAR | Status: DC | PRN
  Administered 2015-04-19 – 2015-04-20 (×3): 7.5 mg via INTRAVENOUS
  Filled 2015-04-19 (×3): qty 1

## 2015-04-19 MED ORDER — DEXTROSE 5 % IV SOLN
1.0000 g | Freq: Once | INTRAVENOUS | Status: DC
  Filled 2015-04-19: qty 10

## 2015-04-19 MED ORDER — IOHEXOL 300 MG/ML  SOLN
25.0000 mL | INTRAMUSCULAR | Status: AC
  Administered 2015-04-19 (×2): 25 mL via ORAL

## 2015-04-19 MED ORDER — SODIUM CHLORIDE 0.9 % IV SOLN
INTRAVENOUS | Status: DC
  Administered 2015-04-19 – 2015-04-21 (×5): via INTRAVENOUS

## 2015-04-19 MED ORDER — POTASSIUM CHLORIDE 10 MEQ/100ML IV SOLN
10.0000 meq | INTRAVENOUS | Status: AC
  Administered 2015-04-19 (×2): 10 meq via INTRAVENOUS
  Filled 2015-04-19 (×2): qty 100

## 2015-04-19 MED ORDER — LORAZEPAM 0.5 MG PO TABS
0.5000 mg | ORAL_TABLET | Freq: Four times a day (QID) | ORAL | Status: DC | PRN
  Administered 2015-04-21: 0.5 mg via ORAL
  Filled 2015-04-19: qty 1

## 2015-04-19 MED ORDER — DEXTROSE 5 % IV SOLN
2.0000 g | INTRAVENOUS | Status: DC
  Administered 2015-04-19: 2 g via INTRAVENOUS
  Filled 2015-04-19: qty 2

## 2015-04-19 MED ORDER — DM-GUAIFENESIN ER 30-600 MG PO TB12
1.0000 | ORAL_TABLET | Freq: Two times a day (BID) | ORAL | Status: DC | PRN
  Administered 2015-04-19 – 2015-04-20 (×4): 1 via ORAL
  Filled 2015-04-19 (×4): qty 1

## 2015-04-19 MED ORDER — PSEUDOEPHEDRINE HCL ER 120 MG PO TB12
120.0000 mg | ORAL_TABLET | Freq: Two times a day (BID) | ORAL | Status: DC
  Administered 2015-04-19 – 2015-04-20 (×3): 120 mg via ORAL
  Filled 2015-04-19 (×7): qty 1

## 2015-04-19 MED ORDER — LEVOTHYROXINE SODIUM 50 MCG PO TABS
175.0000 ug | ORAL_TABLET | Freq: Every day | ORAL | Status: DC
  Administered 2015-04-20 – 2015-04-21 (×2): 175 ug via ORAL
  Filled 2015-04-19 (×2): qty 1

## 2015-04-19 NOTE — Consult Note (Signed)
[name] [birth year]  [id number].   Requesting MD: Dr. Lurline Del Chief Complaint/Reason for Consult: pneumoperitoneum HPI: This is a very pleasant 49 yo white female with known stage 4 breast cancer who was sent today for a CT scan of her chest for staging purposes.  This revealed pneumoperitoneum.  Dr. [name] called Dr. Excell Seltzer who both felt she should be admitted for a CT scan of her abd/pel to further evaluate the possible source.  The patient denies any abdominal pain today or any time in the recent past.  She does state her BMs have been different over the last 10 days, more string like and small in nature.  She has had a poor appetite over the last week and a half.  She did undergo a paracentesis 2 weeks ago for a right sided pleural effusion.  She states she has had some mild bloating, but denies fevers or chills.  Her labs are all essentially normal currently.  We have been asked to see her for further evaluation.  ROS : Please see HPI, otherwise negative  Family History  Problem Relation Age of Onset  . Lung cancer Maternal Grandfather 39    smoker  . Prostate cancer Maternal Grandfather 90  . Throat cancer Other     Great Aunt x 2  . Liver cancer Other     Maternal Great Grandmother  . Melanoma Maternal Uncle 81  . Brain cancer Cousin 11    non-malignant  . Allergic rhinitis Neg Hx   . Asthma Neg Hx   . Eczema Neg Hx   . Urticaria Neg Hx     Past Medical History  Diagnosis Date  . Complication of anesthesia     her father and uncle both had very difficult time waking up-was  something they told them may be hereditory. she will find out.  [name] Hypothyroidism   . 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 Radiation 01/21/13-03/06/13    Right Breast  . Breast cancer ([place]) dx'd 04-10-12-rt    Past Surgical History  Procedure Laterality Date  . Wisdom tooth extraction    . Portacath placement  04/22/2012    Procedure: INSERTION PORT-A-CATH;  Surgeon: [name], MD;  Location: [place];  Service: General;  Laterality: Left;  [name] Modified mastectomy Right 11/18/2012    Procedure:  RIGHT MODIFIED MASTECTOMY;  Surgeon: [name], MD;  Location: [place];  Service: General;  Laterality: Right;  . Knee arthroscopy Right 11/26/2013  . Port-a-cath removal Left 12/10/2013    Procedure: MINOR REMOVAL PORT-A-CATH;  Surgeon: [name], MD;  Location: [place];  Service: General;  Laterality: Left;    Social History:  reports that she has never smoked. She has never used smokeless tobacco. She reports that she does not drink alcohol or use illicit drugs.  Allergies:  Allergies  Allergen Reactions  . Almond Meal Rash  . Almond Oil Rash  . Other Rash    LOTIONS-unknown type  . Tegaderm Ag Mesh [Silver] Rash  . Vancomycin Rash    Red Man Syndrome    Medications Prior to Admission  Medication Sig Dispense Refill  . cholecalciferol (VITAMIN D) 1000 UNITS tablet Take 2,000 Units by mouth 2 (two) times daily.     [name] dextromethorphan-guaiFENesin (MUCINEX DM) 30-600 MG 12hr tablet Take 1 tablet by mouth 2 (two) times daily as needed for cough.    . levothyroxine (SYNTHROID, LEVOTHROID) 175 MCG  tablet Take 175 mcg by mouth daily before breakfast.    . lidocaine-prilocaine (EMLA) cream Apply to affected area once 30 g 3  . LORazepam (ATIVAN) 0.5 MG tablet Take 1 tablet (0.5 mg total) by mouth every 6 (six) hours as needed (Nausea or vomiting). 30 tablet 0  . naproxen sodium (ALEVE) 220 MG tablet Take 2 tablets (440 mg total) by mouth 2 (two) times daily with a meal.    . oxycodone (OXY-IR) 5 MG capsule Take 5 mg by mouth every 4 (four) hours as needed. Breakthrough pain    . oxyCODONE (OXYCONTIN) 10 mg 12 hr tablet Take 10 mg by mouth 2 (two) times daily.      Blood pressure 126/80, pulse 84, temperature 98 F (36.7 C), temperature source Oral, resp. rate 20, height 5' 7.5" (1.715 m), weight  71.215 kg (157 lb), last menstrual period 03/29/2012, SpO2 96 %. Physical Exam: General: pleasant, WD, WN white female who is laying in bed in NAD HEENT: head is normocephalic, atraumatic.  Sclera are noninjected.  PERRL.  Ears and nose without any masses or lesions.  Mouth is pink and moist Heart: regular, rate, and rhythm.  Normal s1,s2. No obvious murmurs, gallops, or rubs noted.  Palpable radial and pedal pulses bilaterally Lungs: CTAB, but significantly decreased breath sounds in right middle and right lower lobes, no wheezes, rhonchi, or rales noted.  Respiratory effort nonlabored Abd: soft, NT, ND, +BS, no masses, hernias, or organomegaly MS: all 4 extremities are symmetrical with no cyanosis, clubbing, or edema, except right upper extremity with chronic edema and lymphedema sleeve in place Skin: warm and dry with no masses, lesions, or rashes Psych: A&Ox3 with an appropriate affect.    Results for orders placed or performed during the hospital encounter of 04/19/15 (from the past 48 hour(s))  Comprehensive metabolic panel     Status: Abnormal   Collection Time: 04/19/15  1:00 PM  Result Value Ref Range   Sodium 141 135 - 145 mmol/L   Potassium 3.2 (L) 3.5 - 5.1 mmol/L   Chloride 103 101 - 111 mmol/L   CO2 30 22 - 32 mmol/L   Glucose, Bld 84 65 - 99 mg/dL   BUN 10 6 - 20 mg/dL   Creatinine, Ser 0.49 0.44 - 1.00 mg/dL   Calcium 8.5 (L) 8.9 - 10.3 mg/dL   Total Protein 6.3 (L) 6.5 - 8.1 g/dL   Albumin 2.8 (L) 3.5 - 5.0 g/dL   AST 18 15 - 41 U/L   ALT 10 (L) 14 - 54 U/L   Alkaline Phosphatase 105 38 - 126 U/L   Total Bilirubin 0.7 0.3 - 1.2 mg/dL   GFR calc non Af Amer >60 >60 mL/min   GFR calc Af Amer >60 >60 mL/min    Comment: (NOTE) The eGFR has been calculated using the CKD EPI equation. This calculation has not been validated in all clinical situations. eGFR's persistently <60 mL/min signify possible Chronic Kidney Disease.    Anion gap 8 5 - 15  CBC WITH DIFFERENTIAL      Status: Abnormal   Collection Time: 04/19/15  1:00 PM  Result Value Ref Range   WBC 3.4 (L) 4.0 - 10.5 K/uL   RBC 3.00 (L) 3.87 - 5.11 MIL/uL   Hemoglobin 9.7 (L) 12.0 - 15.0 g/dL   HCT 30.6 (L) 36.0 - 46.0 %   MCV 102.0 (H) 78.0 - 100.0 fL   MCH 32.3 26.0 - 34.0 pg   MCHC 31.7  30.0 - 36.0 g/dL   RDW 16.1 (H) 11.5 - 15.5 %   Platelets 265 150 - 400 K/uL   Neutrophils Relative % 79 %   Neutro Abs 2.6 1.7 - 7.7 K/uL   Lymphocytes Relative 11 %   Lymphs Abs 0.4 (L) 0.7 - 4.0 K/uL   Monocytes Relative 7 %   Monocytes Absolute 0.2 0.1 - 1.0 K/uL   Eosinophils Relative 3 %   Eosinophils Absolute 0.1 0.0 - 0.7 K/uL   Basophils Relative 0 %   Basophils Absolute 0.0 0.0 - 0.1 K/uL  Protime-INR     Status: Abnormal   Collection Time: 04/19/15  1:00 PM  Result Value Ref Range   Prothrombin Time 16.1 (H) 11.6 - 15.2 seconds   INR 1.28 0.00 - 1.49  TSH     Status: None   Collection Time: 04/19/15  1:00 PM  Result Value Ref Range   TSH 3.635 0.350 - 4.500 uIU/mL   Ct Chest W Contrast  04/19/2015  CLINICAL DATA:  49 year old female with history of right-sided breast cancer diagnosed in 2013 with known bone metastasis. Status post radiation therapy and chemotherapy, now complete. Chemotherapy scheduled to begin again on 04/20/2015. Status post mastectomy. Cough and shortness of breath for the past 3-4 months. EXAM: CT CHEST WITH CONTRAST TECHNIQUE: Multidetector CT imaging of the chest was performed during intravenous contrast administration. CONTRAST:  35m OMNIPAQUE IOHEXOL 300 MG/ML  SOLN COMPARISON:  PET-CT 03/17/2015.  Chest CT 03/03/2015. FINDINGS: Mediastinum/Lymph Nodes: Heart size is normal. Small amount of pericardial fluid and/or thickening, similar to the prior study and unlikely to be of hemodynamic significance at this time. Worsening multifocal lymphadenopathy throughout the mediastinum, most evident in the anterior mediastinum where multiple borderline enlarged and mildly  enlarged lymph nodes are noted measuring up to 1 cm. Subcarinal adenopathy also increased measuring up to 1 cm in short axis. Bulky lymphatic tissue at the right hilum, difficult to discretely measure, but measuring up to 12 mm in thickness. Esophagus is unremarkable in appearance. No axillary lymphadenopathy. No internal mammary lymphadenopathy. Left-sided internal jugular single-lumen porta cath with tip terminating in the distal superior vena cava. Lungs/Pleura: The previous described right middle lobe lesion is difficult to visualize on today's examination, but appears significantly increased in size, currently measuring approximately 5.9 x 3.8 cm (image 33 of series 2), currently occupying nearly the entire right middle lobe (with remaining portions atelectatic). Marked interval enlargement of a malignant right pleural effusion, with increasing areas of pleural thickening and enhancing pleural nodularity throughout the right hemithorax, most evident at the base of the right hemithorax where the largest pleural-based mass measures 1.8 x 4.3 cm (image 59 of series 2). Extensive passive atelectasis in portions of the right lower lobe and right upper lobe. Throughout the aerated portions of the right upper and right lower lobes there is extensive septal thickening, somewhat nodular in appearance, concerning for worsening lymphangitic spread of tumor. There is also small left-sided pleural effusion which layers dependently and is simple in appearance. This is associated with some passive subsegmental atelectasis in the left lower lobe. Upper Abdomen: Multiple ill-defined hypovascular areas in the liver are new compared to prior studies, and appear concerning for probable widespread metastatic disease to the liver, the largest of which is estimated to measure approximately 1.7 x 1.2 cm in segment 2 of the liver (image 55 of series 2). New right adrenal mass measuring 3.1 x 1.5 cm, presumably a metastatic lesion.  Incompletely visualize soft  tissue near the hepatic hilum immediately inferior to the portal vein measuring at least 1.5 x 2.8 cm (image 67 of series 2), likely to represent a metastatic hepatoduodenal ligament lymph node. Multiple borderline enlarged and mildly enlarged gastrohepatic lymph nodes measuring up to 1 cm in short axis. Multiple borderline enlarged celiac axis lymph nodes. Pneumoperitoneum visualized in the upper abdomen. Musculoskeletal/Soft Tissues: Ill-defined areas of sclerosis are noted in the manubrium and superior aspect of the sternum, similar to prior studies (nonspecific). Compression fracture of superior endplate of T9 is similar to the prior study, with approximately 25% loss of central vertebral body height on the left side. IMPRESSION: 1. Pneumoperitoneum. Correlation with CT of the abdomen and pelvis is suggested to identify potential source for this unexpected finding if clinically appropriate. 2. Marked progression of disease as evidenced by enlarging right middle lobe mass, markedly enlarged malignant right pleural effusion, and evidence of lymphangitic spread of disease throughout the right lung, with increasing right hilar and mediastinal lymphadenopathy, as discussed above. In addition, there are likely multifocal new hepatic metastases, a right adrenal metastasis, and multiple upper abdominal lymph nodes, concerning for metastatic disease. 3. New small left pleural effusion with some passive subsegmental atelectasis in the left lower lobe. 4. Additional incidental findings, as above. Critical Value/emergent results were called by telephone at the time of interpretation on 04/19/2015 at 9:52 am to Dr. Lurline Del, who verbally acknowledged these results. Electronically Signed   By: Vinnie Langton M.D.   On: 04/19/2015 09:53       Assessment/Plan 1. Stage 4 metastatic breast cancer -per oncology 2. Pneumoperitoneum -the patient is asymptomatic and her labs are currently  normal.  She does have free air on her CT scan of her chest.  We will obtain a CT of her abd/pelvis to further evaluate for a possible source.  It is possible that the source of her free is forced intra-abdominal from her chest given her significant pleural effusion noted on CT scan of her chest. -if she does have an intra-abdominal source then hopefully this is something that can be managed non-operatively. -we will follow and further recommendations pending CT scan.  Delores Thelen E 04/19/2015, 4:21 PM Pager: [phone]

## 2015-04-19 NOTE — Progress Notes (Signed)
  Radiation Oncology         [phone] ________________________________  Name: Nicole Valentine MRN: [id number]  Date: 04/13/2015  DOB: 03/16/1966  End of Treatment Note  DIAGNOSIS:    ICD-9-CM ICD-10-CM   1. Brain metastases ([place]) 198.3 C79.31        Indication for treatment:  palliative       Radiation treatment dates:   03/25/2015-04/13/2015  Site/dose:   Whole brain/ 35 Gy in 14 fractions  Beams/energy:  6X / opposed beams  Narrative: The patient tolerated radiation treatment relatively well.    Plan: The patient has completed radiation treatment. The patient will return to radiation oncology clinic for routine followup in one month. I advised them to call or return sooner if they have any questions or concerns related to their recovery or treatment.  -----------------------------------  Eppie Gibson, MD

## 2015-04-19 NOTE — H&P (Signed)
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 Today we obtained a restaging CT of the chest to serve as a new baseline. This does show significant progression of disease compared to prior. More urgently though it also showed pneumoperitoneum. Accordingly the patient has been recalled for evaluation and is being admitted for observation and treatment  REVIEW OF SYSTEMS: Nicole Valentine denies any abdominal pain or discomfort. Note that she has been off dexamethasone for at least 10 days.Her bowel movements have been somewhat different recently. There are more like "pellets" and occasionally "strings". There have been brown, not bloody or black. She has also occasionally found that if she passes gas there is a "floating phlegm" on the surface of the water. This is clear. Aside from abdominal problems she tells me she has no appetite, although food does taste good. She is quickly sated. She is very short of breath with activity and particularly going upstairs is getting to be very difficult for her.currently she denies pain and is taking no pain medicine. If she hurts somewhere she would take Tylenol or Aleve. The pain mostly is in her right shoulder where she also has of course the lymphedema. She denies unusual headaches, visual changes, nausea or vomiting problems. A detailed review of systems today was otherwise  stable  BREAST CANCER HISTORY: From Nicole Valentine's intake notes 11/10/2012:  "She originally palpated a right breast mass. She had a mammogram performed that showed dense breasts bilaterally. Ultrasound of the right breast showed 2.3 x 1.9 cm area of abnormality with multiple abnormal lymph nodes. MRI of the bilateral breasts showed asymmetrical enhancement throughout the right breast consistent with multicentric disease. An ultrasound-guided biopsy performed. The known area of disease measured 1.9 x 1.9 x 2.3 cm. Multiple abnormal positive right axillary lymph nodes were noted. 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 Biopsy of the right axillary lymph node was positive for malignancy with extracapsular extension. Patient was originally seen by Nicole Valentine Nicole Valentine and Nicole Valentine. She has elected to have a right mastectomy eventually and declined biopsies of any other areas within the breast."  She went on to receive neoadjuvant chemotherapy and attained a complete pathologic response, as documented below  METASTATIC DISEASE: From the prior summary note: Nicole Valentine noted a very small right supraclavicular mass 09/25/2014, which she brought to our attention. She was having some allergy symptoms at the time so we decided to reevaluate this after a few weeks and on 10/22/2014 as the mass persisted we obtained a restaging neck CT scan, 10/28/2014. There was bilateral supraclavicular adenopathy, but also a large right pleural effusion was incidentally noted. We proceeded to right thoracentesis 10/29/2014, and a liter of hazy yellow fluid was removed. Cytology from this procedure ([id number]) showed malignant cells consistent with adenocarcinoma, estrogen and progesterone receptor negative, HER-2 not amplified, with a signals ratio 1.12 and the number per cell being 2.75, and with an MIB-1 of  80%. I called Nicole Valentine with these results  and set her up for a PET scan performed 11/06/2014, which shows widespread metastatic disease involving particularly the right lung, liver, and bones, but also the left lung, right adrenal, and multiple lymph node areas.  Her subsequent history is as detailed below   PAST MEDICAL HISTORY: Past Medical History  Diagnosis Date  . Complication of anesthesia     her father and uncle both had very difficult time waking up-was  something they told them may be hereditory. she will find out.  [name] Hypothyroidism   . 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 Radiation 01/21/13-03/06/13    Right Breast  . Breast cancer ([place]) dx'd 04-10-12-rt    PAST SURGICAL HISTORY: Past Surgical History  Procedure Laterality Date  . Wisdom tooth extraction    . Portacath placement  04/22/2012    Procedure: INSERTION PORT-A-CATH;  Surgeon: Nicole Lasso, MD;  Location: [facility];  Service: General;  Laterality: Left;  [name] Modified mastectomy Right 11/18/2012    Procedure:  RIGHT MODIFIED MASTECTOMY;  Surgeon: Nicole Lasso, MD;  Location: [facility];  Service: General;  Laterality: Right;  . Knee arthroscopy Right 11/26/2013  . Port-a-cath removal Left 12/10/2013    Procedure: MINOR REMOVAL PORT-A-CATH;  Surgeon: Nicole Hector, MD;  Location: [facility];  Service: General;  Laterality: Left;    FAMILY HISTORY Family History  Problem Relation Age of Onset  . Lung cancer Maternal Grandfather 37    smoker  . Prostate cancer Maternal Grandfather 90  . Throat cancer Other     Great Aunt x 2  . Liver cancer Other     Maternal Great Grandmother  . Melanoma Maternal Uncle 81  . Brain cancer Cousin 11    non-malignant  . Allergic rhinitis Neg Hx   . Asthma Neg Hx   . Eczema Neg Hx   . Urticaria Neg Hx   the patient's parents are living, in their early 45s. The patient has 2 brothers, no sisters. The patient's mother was diagnosed with breast cancer, HER-2 positive,  in 2014 in [place].  GYNECOLOGIC HISTORY:  Patient's last menstrual period was 03/29/2012. Menarche age 35, first live birth age 19. She is GX P4. LMP January 2014. Periods stopped with chemotherapy and have not resumed  SOCIAL HISTORY:  Nicole Valentine 2 of her 4 children. The children are currently ages 76, 63, 72, and 8. Her husband, [name], is a Customer service manager.He just started a job for [employer]. They attend the [place] DIRECTIVES: her husband [name] is her healthcare power of attorney; I discussed advanced directives with her at the time of this admission and she wishes to remain a full code   HEALTH MAINTENANCE: Social History  Substance Use Topics  . Smoking status: Never Smoker   . Smokeless tobacco: Never Used  . Alcohol Use: No    Allergies  Allergen Reactions  . Almond Meal Rash  . Almond Oil Rash  . Other Rash    LOTIONS-unknown type  . Tegaderm Ag Mesh [Silver] Rash  . Vancomycin Rash    Red Man Syndrome    Current Facility-Administered Medications  Medication Dose Route Frequency Provider Last Rate Last Dose  . 0.9 %  sodium chloride infusion   Intravenous Continuous Nicole Cruel, MD      . enoxaparin (LOVENOX) injection 40 mg  40 mg Subcutaneous Q24H  Nicole Cruel, MD        OBJECTIVE: middle-aged white woman who appears stated aage Filed Vitals:   04/19/15 1239  BP: 126/80  Pulse: 84  Temp: 98 F (36.7 C)  Resp: 20     There is no weight on file to calculate BMI.    ECOG FS:2 - Symptomatic, <50% confined to bed  Ocular: Sclerae unicteric, pupils equal, round and reactive to light Ear-nose-throat: Oropharynx clear and moist Lymphatic: No cervical or supraclavicular adenopathy Lungs dullness right basis previously described Heart regular rate and rhythm, no murmur appreciated Abd soft, nontender all quadrants MSK no focal spinal tenderness,right upper extremity lymphedema with compression sleeve in  place Neuro: non-focal, well-oriented, appropriate affect Breasts: deferred   LAB RESULTS:  CMP     Component Value Date/Time   NA 142 03/22/2015 0802   NA 138 02/25/2015 0203   K 4.0 03/22/2015 0802   K 3.4* 02/25/2015 0203   CL 105 02/25/2015 0203   CL 101 10/18/2012 0859   CO2 29 03/22/2015 0802   CO2 25 02/25/2015 0203   GLUCOSE 100 03/22/2015 0802   GLUCOSE 110* 02/25/2015 0203   GLUCOSE 83 10/18/2012 0859   BUN 5.6* 03/22/2015 0802   BUN 7 02/25/2015 0203   CREATININE 0.7 03/22/2015 0802   CREATININE 0.54 02/25/2015 0203   CALCIUM 9.0 03/22/2015 0802   CALCIUM 8.3* 02/25/2015 0203   PROT 6.1* 03/22/2015 0802   PROT 6.6 11/17/2014 2205   ALBUMIN 3.2* 03/22/2015 0802   ALBUMIN 3.6 11/17/2014 2205   AST 15 03/22/2015 0802   AST 22 11/17/2014 2205   ALT 11 03/22/2015 0802   ALT 20 11/17/2014 2205   ALKPHOS 142 03/22/2015 0802   ALKPHOS 94 11/17/2014 2205   BILITOT 0.42 03/22/2015 0802   BILITOT 0.6 11/17/2014 2205   GFRNONAA >60 02/25/2015 0203   GFRAA >60 02/25/2015 0203    INo results found for: SPEP, UPEP  Lab Results  Component Value Date   WBC 4.9 04/12/2015   NEUTROABS 4.1 04/12/2015   HGB 10.5* 04/12/2015   HCT 32.7* 04/12/2015   MCV 100.8 04/12/2015   PLT 357 04/12/2015    _0 @  Lab Results  Component Value Date   LABCA2 34 05/24/2012    No components found for: [id number]  No results for input(s): INR in the last 168 hours.  Urinalysis    Component Value Date/Time   COLORURINE YELLOW 01/06/2015 1703   APPEARANCEUR CLEAR 01/06/2015 1703   LABSPEC 1.009 01/06/2015 1703   LABSPEC 1.005 12/28/2014 1207   PHURINE 6.5 01/06/2015 1703   PHURINE 7.5 12/28/2014 [place] 01/06/2015 1703   GLUCOSEU Negative 12/28/2014 1207   HGBUR NEGATIVE 01/06/2015 1703   HGBUR Negative 12/28/2014 1207   BILIRUBINUR NEGATIVE 01/06/2015 1703   BILIRUBINUR Negative 12/28/2014 1207   KETONESUR NEGATIVE 01/06/2015 1703    KETONESUR Negative 12/28/2014 1207   PROTEINUR NEGATIVE 01/06/2015 1703   PROTEINUR Negative 12/28/2014 1207   UROBILINOGEN 0.2 12/28/2014 1207   UROBILINOGEN 0.2 11/18/2014 0226   NITRITE NEGATIVE 01/06/2015 1703   NITRITE Negative 12/28/2014 [place] 01/06/2015 1703   LEUKOCYTESUR Trace 12/28/2014 1207    STUDIES: Dg Chest 1 View  03/30/2015  CLINICAL DATA:  Patient status post right thoracentesis. History shortness of breath. EXAM: CHEST 1 VIEW COMPARISON:  Chest radiograph 03/30/2015 FINDINGS: Left anterior chest wall Port-A-Cath is present with tip projecting over the superior vena cava. Stable cardiac and mediastinal contours.  Unchanged moderate to large right pleural effusion with underlying pulmonary consolidation. Possible tiny left pleural effusion. Minimal atelectasis left lung base. No definite pneumothorax. Right chest wall surgical clips. IMPRESSION: Re- demonstrated moderate to large right pleural effusion with underlying airspace opacification. Electronically Signed   By: Lovey Newcomer M.D.   On: 03/30/2015 15:38   Dg Chest 2 View  04/12/2015  CLINICAL DATA:  Worsening shortness of breath, history of right breast carcinoma with bone metastasis EXAM: CHEST  2 VIEW COMPARISON:  Chest x-ray of 03/30/2015 FINDINGS: There is little change in the volume of the moderate sized right pleural effusion with volume loss at the right lung base. The left lung is clear. A tiny left pleural effusion is difficult to exclude. Mediastinal and hilar contours are unchanged and the heart is within normal limits in size. A left-sided Port-A-Cath is present with the tip seen to the mid SVC. No acute bony abnormality is seen. IMPRESSION: Little change in moderate size right pleural effusion with volume loss at the right lung base. Cannot exclude a tiny left pleural effusion. Electronically Signed   By: Ivar Drape M.D.   On: 04/12/2015 10:47   Dg Chest 2 View  03/30/2015  CLINICAL DATA:   Increase in shortness of breath and secretions. Cough. Metastatic breast cancer. EXAM: CHEST - 2 VIEW COMPARISON:  PET scan 03/17/2015.  CT chest 03/03/2015. FINDINGS: The heart size is normal. A left IJ Port-A-Cath is noted. The right pleural effusion has significantly increased encompassing over half hemi thorax. The inferior right hemithorax is completely opacified. The left lung is clear. There is no significant left effusion. IMPRESSION: 1. Progressive large right pleural effusion obscuring underlying airspace disease and tumor. 2. Stable Port-A-Cath. These results will be called to the ordering clinician or representative by the Radiologist Assistant, and communication documented in the PACS or zVision Dashboard. Electronically Signed   By: San Morelle M.D.   On: 03/30/2015 12:58   Ct Chest W Contrast  04/19/2015  CLINICAL DATA:  49 year old female with history of right-sided breast cancer diagnosed in 2013 with known bone metastasis. Status post radiation therapy and chemotherapy, now complete. Chemotherapy scheduled to begin again on 04/20/2015. Status post mastectomy. Cough and shortness of breath for the past 3-4 months. EXAM: CT CHEST WITH CONTRAST TECHNIQUE: Multidetector CT imaging of the chest was performed during intravenous contrast administration. CONTRAST:  25m OMNIPAQUE IOHEXOL 300 MG/ML  SOLN COMPARISON:  PET-CT 03/17/2015.  Chest CT 03/03/2015. FINDINGS: Mediastinum/Lymph Nodes: Heart size is normal. Small amount of pericardial fluid and/or thickening, similar to the prior study and unlikely to be of hemodynamic significance at this time. Worsening multifocal lymphadenopathy throughout the mediastinum, most evident in the anterior mediastinum where multiple borderline enlarged and mildly enlarged lymph nodes are noted measuring up to 1 cm. Subcarinal adenopathy also increased measuring up to 1 cm in short axis. Bulky lymphatic tissue at the right hilum, difficult to discretely  measure, but measuring up to 12 mm in thickness. Esophagus is unremarkable in appearance. No axillary lymphadenopathy. No internal mammary lymphadenopathy. Left-sided internal jugular single-lumen porta cath with tip terminating in the distal superior vena cava. Lungs/Pleura: The previous described right middle lobe lesion is difficult to visualize on today's examination, but appears significantly increased in size, currently measuring approximately 5.9 x 3.8 cm (image 33 of series 2), currently occupying nearly the entire right middle lobe (with remaining portions atelectatic). Marked interval enlargement of a malignant right pleural effusion, with increasing areas of pleural thickening  and enhancing pleural nodularity throughout the right hemithorax, most evident at the base of the right hemithorax where the largest pleural-based mass measures 1.8 x 4.3 cm (image 59 of series 2). Extensive passive atelectasis in portions of the right lower lobe and right upper lobe. Throughout the aerated portions of the right upper and right lower lobes there is extensive septal thickening, somewhat nodular in appearance, concerning for worsening lymphangitic spread of tumor. There is also small left-sided pleural effusion which layers dependently and is simple in appearance. This is associated with some passive subsegmental atelectasis in the left lower lobe. Upper Abdomen: Multiple ill-defined hypovascular areas in the liver are new compared to prior studies, and appear concerning for probable widespread metastatic disease to the liver, the largest of which is estimated to measure approximately 1.7 x 1.2 cm in segment 2 of the liver (image 55 of series 2). New right adrenal mass measuring 3.1 x 1.5 cm, presumably a metastatic lesion. Incompletely visualize soft tissue near the hepatic hilum immediately inferior to the portal vein measuring at least 1.5 x 2.8 cm (image 67 of series 2), likely to represent a metastatic  hepatoduodenal ligament lymph node. Multiple borderline enlarged and mildly enlarged gastrohepatic lymph nodes measuring up to 1 cm in short axis. Multiple borderline enlarged celiac axis lymph nodes. Pneumoperitoneum visualized in the upper abdomen. Musculoskeletal/Soft Tissues: Ill-defined areas of sclerosis are noted in the manubrium and superior aspect of the sternum, similar to prior studies (nonspecific). Compression fracture of superior endplate of T9 is similar to the prior study, with approximately 25% loss of central vertebral body height on the left side. IMPRESSION: 1. Pneumoperitoneum. Correlation with CT of the abdomen and pelvis is suggested to identify potential source for this unexpected finding if clinically appropriate. 2. Marked progression of disease as evidenced by enlarging right middle lobe mass, markedly enlarged malignant right pleural effusion, and evidence of lymphangitic spread of disease throughout the right lung, with increasing right hilar and mediastinal lymphadenopathy, as discussed above. In addition, there are likely multifocal new hepatic metastases, a right adrenal metastasis, and multiple upper abdominal lymph nodes, concerning for metastatic disease. 3. New small left pleural effusion with some passive subsegmental atelectasis in the left lower lobe. 4. Additional incidental findings, as above. Critical Value/emergent results were called by telephone at the time of interpretation on 04/19/2015 at 9:52 am to Dr. Lurline Del, who verbally acknowledged these results. Electronically Signed   By: Vinnie Langton M.D.   On: 04/19/2015 09:53   US Thoracentesis Asp Pleural Space W/img Guide  03/30/2015  INDICATION: Metastatic breast cancer, dyspnea, recurrent right pleural effusion. Request is made for therapeutic right thoracentesis. EXAM: ULTRASOUND GUIDED THERAPEUTIC RIGHT THORACENTESIS COMPARISON:  Prior thoracentesis on 10/29/2014 MEDICATIONS: None COMPLICATIONS: None  immediate TECHNIQUE: Informed written consent was obtained from the patient after a discussion of the risks, benefits and alternatives to treatment. A timeout was performed prior to the initiation of the procedure. Initial ultrasound scanning demonstrates a moderate right pleural effusion. The lower chest was prepped and draped in the usual sterile fashion. 1% lidocaine was used for local anesthesia. An ultrasound image was saved for documentation purposes. A 6 Fr Safe-T-Centesis catheter was introduced. The thoracentesis was performed. The catheter was removed and a dressing was applied. The patient tolerated the procedure well without immediate post procedural complication. The patient was escorted to have an upright chest radiograph. FINDINGS: A total of approximately 900 cc of bloody fluid was removed. Due to the multi-septated nature of  the pleural collection only the above amount of fluid could be removed today. IMPRESSION: Successful ultrasound-guided therapeutic right sided thoracentesis yielding 900 cc of pleural fluid. Read by: Rowe Robert, PA-C Electronically Signed   By: Jerilynn Mages.  Shick M.D.   On: 03/30/2015 15:42    ASSESSMENT: 49 y.o. BRCA negative [place] woman with triple-negative stage IV breast cancer  (1) an status post right breast upper outer quadrant and right axillary lymph node biopsy 04/04/2012, both positive for an invasive ductal carcinoma, grade 3, triple negative, with an MIB-1 of 25%  (2) Treated neoadjuvantly with (a) fluorouracil, cyclophosphamide, and epirubicin (at 100 mg/M2) x4 completed 06/07/2012 (b) docetaxel (75 mg/M2) for one dose, 06/21/2012, poorly tolerated (c) carboplatin and gemcitabine given every 21 days for 6 cycles completed 10/11/2012  (3) status post right modified radical mastectomy 11/18/2012 showing a complete pathologic response--all 16 lymph nodes were benign (a) no plans for reconstructon  (4)  adjuvant radiation therapy completed 03/06/2013  METASTATIC DISEASE June 2016 (5) 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, 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  (6) staging PET scan 11/06/2014 showed significant disease in the middle and lower lobes of the Right lung, Right effusion, multiple liver and bone lesions, as well as left lung nodules, a Right adrenal nodule, and widespread hypermetabolic adenopathy (a) brain MRI 11/16/2014 showed multiple cerebellar lesions  (7) RADIATION TREATMENTS thoracic metastases (a) 01/28/2015-02/11/2015: Thoracic Spine T8-10, 30 Gy in 10 fractions  (7) abraxane day 1 and day 8 of each 21 day cycle started 11/09/2014: last dose 03/12/2015 (6 cycles) (a) PET scan 01/05/2015 shows an excellent initial response after 3 cycles (b) PET scan 03/17/2015 shows progression  (8) zolendronate started 11/16/2014, to be repeated every 12 weeks, most recent dose 02/09/2015  (9) Foundation 1 study sent 11/06/2014: patient's sister in law Garvin Fila following [phone]) (a) 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[place], NTRK1, MYC, ARID1A and LYN, none w approved therapies (b) pazopanib, ponatinib or crizotinib suggested as possible off-protocol options  (10) RADIATION TREATMENTS: Brain metastases: (a) s/p SRS therapy 12/11/2014 to 4 right cerebellar lesions (b) multiple (18+) lesions noted on repeat brain MRI 03/16/2015 (c) whole brain irradiation to be completed 04/13/2015  (11) uncontrolled pain: Started on OxyContin 10 mg twice a day with oxycodone 5 mg as needed 03/26/2015  (12) Right pleural effusion (a) s/p thoracentesis July 2016 (x2), 03/30/2015  (13) to start cisplatin 04/27/2015, repeated every 21 days  (14) pneumoperitoneum noted on restaging chest CT scan 04/19/2015   PLAN: I am admitting [facility] for further workup of  the pneumoperitoneum. We are obtaining a CT of the abdomen and pelvis with contrast. She will be nothing by mouth and start IV fluids. Depending on the results of the scan, and repeat exam this evening as well as vitals and lab work we will consider starting broad-spectrum antibiotics with ceftriaxone and metronidazole IV. I am also asking surgery to consult  It would be very unusual to have significant bowel perforation with absolutely no symptoms as is the case here. Possibly what we are seeing is related to her right lung processes or the right thoracentesis she underwent November 8,but note that a chest x-ray 04/12/2015 did not show any evidence of peritoneal air.  I discussed advanced directives with Nicole Valentine. She and her husband are very clear she wishes to BE a full code "for now".  The plan was to start cis-platinum chemotherapy tomorrow. I am moving that back for a week as I do not think we can treat until  the abdominal situation is completely clarified.  Nicole Cruel, MD   04/19/2015 1:04 PM Medical Oncology and Hematology [facility] [address] [place], [place] [address] Tel. [phone]    Fax. [phone]

## 2015-04-19 NOTE — Progress Notes (Signed)
ADDENDUM:   CT abd/pelvis shows pneumatosis. This is c/w the benign clinical presentation. I will start antibiotics, check additional labs in AM, and if surgery concurs after reviewing the scans would d/c tomorrow on po flagyl and plan on chemo Friday.  Discussed with patient.

## 2015-04-19 NOTE — Telephone Encounter (Signed)
Called patient with  12/6 chemo and 12/7 ivf but per mailbox is full,patient is active on mychart

## 2015-04-19 NOTE — Progress Notes (Signed)
Nicole Valentine  April 06, 1966 [id number]  Patient Care Team: Carol Ada, MD as PCP - General (Family Medicine) Chauncey Cruel, MD as Consulting Physician (Oncology)  CT scan shows pneumatosis.  Agree with Dr. Virgie Dad recommendation of antibiotics.  Dr. Excell Seltzer to follow-up clinically tomorrow.  Patient Active Problem List   Diagnosis Date Noted  . Pneumoperitoneum 04/19/2015  . Chronic rhinitis 04/05/2015  . Coughing 04/05/2015  . History of food allergy 04/05/2015  . Uncontrolled pain 03/26/2015  . Cancer associated pain 03/02/2015  . Hyperkalemia 02/27/2015  . Central line infection 02/27/2015  . Breast cancer ([place]) 02/08/2015  . Bone metastases ([place]) 01/15/2015  . Hypersensitivity reaction 01/10/2015  . Acute respiratory failure with hypoxia ([place]) 11/18/2014  . 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 SIRS (systemic inflammatory response syndrome) ([place]) 11/18/2014  . Brain metastases ([place]) 11/16/2014  . Breast cancer metastasized to bone ([place]) 11/07/2014  . Breast cancer metastasized to multiple sites ([place]) 11/07/2014  . Mass of neck 09/25/2014  . Lymphedema of arm 10/03/2013  . Knee pain, right 10/03/2013  . GERD (gastroesophageal reflux disease) 10/03/2013  . History of breast cancer 10/03/2013  . Breast cancer of upper-outer quadrant of right female breast ([place]) 01/28/2013  . 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 Complication of anesthesia     her father and uncle both had very difficult time waking up-was  something they told them may be hereditory. she will find out.  [name] Hypothyroidism   . 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 Radiation 01/21/13-03/06/13    Right Breast  . Breast cancer ([place]) dx'd 04-10-12-rt    Past Surgical History  Procedure Laterality Date  . Wisdom tooth extraction    . Portacath placement  04/22/2012    Procedure: INSERTION PORT-A-CATH;  Surgeon: [name], MD;  Location: [facility];  Service: General;  Laterality: Left;  [name] Modified mastectomy Right 11/18/2012    Procedure:  RIGHT MODIFIED MASTECTOMY;  Surgeon: [name], MD;  Location: [place] [facility];  Service: General;  Laterality: Right;  . Knee arthroscopy Right 11/26/2013  . Port-a-cath removal Left 12/10/2013    Procedure: MINOR REMOVAL PORT-A-CATH;  Surgeon: [name], MD;  Location: [facility];  Service: General;  Laterality: Left;    Social History   Social History  . Marital Status: Married    Spouse Name: N/A  . Number of Children: N/A  . Years of Education: N/A   Occupational History  . Not on file.   Social History Main Topics  . Smoking status: Never Smoker   . Smokeless tobacco: Never Used  . Alcohol Use: No  . Drug Use: No  . Sexual Activity: Yes   Other Topics Concern  . Not on file   Social History Narrative    Family History  Problem Relation Age of Onset  . Lung cancer Maternal Grandfather 54    smoker  . Prostate cancer Maternal Grandfather 90  . Throat cancer Other     Great Aunt x 2  . Liver cancer Other     Maternal Great Grandmother  . Melanoma Maternal Uncle 81  . Brain cancer Cousin 11    non-malignant  . Allergic rhinitis Neg Hx   . Asthma Neg Hx   . Eczema Neg Hx   . Urticaria Neg Hx     Current Facility-Administered Medications  Medication Dose Route Frequency Provider Last  Rate Last Dose  . 0.9 %  sodium chloride infusion   Intravenous Continuous Chauncey Cruel, MD 150 mL/hr at 04/19/15 1843    . cefTRIAXone (ROCEPHIN) 1 g in dextrose 5 % 50 mL IVPB  1 g Intravenous Once Chauncey Cruel, MD      . dextromethorphan-guaiFENesin [facility] DM) 30-600 MG per 12 hr tablet 1 tablet  1 tablet Oral BID PRN Chauncey Cruel, MD   1 tablet at 04/19/15 1407  . enoxaparin (LOVENOX) injection 40 mg  40 mg Subcutaneous Q24H Chauncey Cruel, MD   40 mg at 04/19/15 1407  . ketorolac (TORADOL) 15 MG/ML injection 7.5  mg  7.5 mg Intravenous Q6H PRN Chauncey Cruel, MD      . levothyroxine (SYNTHROID, LEVOTHROID) tablet 175 mcg  175 mcg Oral QAC breakfast Chauncey Cruel, MD   175 mcg at 04/19/15 1600  . loratadine (CLARITIN) tablet 5 mg  5 mg Oral BID Chauncey Cruel, MD   5 mg at 04/19/15 1914   And  . pseudoephedrine (SUDAFED) 12 hr tablet 120 mg  120 mg Oral BID Chauncey Cruel, MD   120 mg at 04/19/15 1915  . LORazepam (ATIVAN) tablet 0.5 mg  0.5 mg Oral Q6H PRN Chauncey Cruel, MD      . metroNIDAZOLE (FLAGYL) tablet 500 mg  500 mg Oral 3 times per day Chauncey Cruel, MD      . pantoprazole (PROTONIX) injection 40 mg  40 mg Intravenous Q24H Chauncey Cruel, MD   40 mg at 04/19/15 1600  . potassium chloride 10 mEq in 100 mL IVPB  10 mEq Intravenous Q1 Hr x 2 Chauncey Cruel, MD   10 mEq at 04/19/15 2021  . prochlorperazine (COMPAZINE) injection 10 mg  10 mg Intravenous Q6H PRN Chauncey Cruel, MD         Allergies  Allergen Reactions  . Almond Meal Rash  . Almond Oil Rash  . Other Rash    LOTIONS-unknown type  . Tegaderm Ag Mesh [Silver] Rash  . Vancomycin Rash    Red Man Syndrome    BP 145/85 mmHg  Pulse 88  Temp(Src) 98.1 F (36.7 C) (Oral)  Resp 18  Ht 5' 7.5" (1.715 m)  Wt 71.215 kg (157 lb)  BMI 24.21 kg/m2  SpO2 93%  LMP 03/29/2012  Dg Chest 1 View  03/30/2015  CLINICAL DATA:  Patient status post right thoracentesis. History shortness of breath. EXAM: CHEST 1 VIEW COMPARISON:  Chest radiograph 03/30/2015 FINDINGS: Left anterior chest wall Port-A-Cath is present with tip projecting over the superior vena cava. Stable cardiac and mediastinal contours. Unchanged moderate to large right pleural effusion with underlying pulmonary consolidation. Possible tiny left pleural effusion. Minimal atelectasis left lung base. No definite pneumothorax. Right chest wall surgical clips. IMPRESSION: Re- demonstrated moderate to large right pleural effusion with underlying airspace  opacification. Electronically Signed   By: Lovey Newcomer M.D.   On: 03/30/2015 15:38   Dg Chest 2 View  04/12/2015  CLINICAL DATA:  Worsening shortness of breath, history of right breast carcinoma with bone metastasis EXAM: CHEST  2 VIEW COMPARISON:  Chest x-ray of 03/30/2015 FINDINGS: There is little change in the volume of the moderate sized right pleural effusion with volume loss at the right lung base. The left lung is clear. A tiny left pleural effusion is difficult to exclude. Mediastinal and hilar contours are unchanged and the heart is within normal limits in  size. A left-sided Port-A-Cath is present with the tip seen to the mid SVC. No acute bony abnormality is seen. IMPRESSION: Little change in moderate size right pleural effusion with volume loss at the right lung base. Cannot exclude a tiny left pleural effusion. Electronically Signed   By: Ivar Drape M.D.   On: 04/12/2015 10:47   Dg Chest 2 View  03/30/2015  CLINICAL DATA:  Increase in shortness of breath and secretions. Cough. Metastatic breast cancer. EXAM: CHEST - 2 VIEW COMPARISON:  PET scan 03/17/2015.  CT chest 03/03/2015. FINDINGS: The heart size is normal. A left IJ Port-A-Cath is noted. The right pleural effusion has significantly increased encompassing over half hemi thorax. The inferior right hemithorax is completely opacified. The left lung is clear. There is no significant left effusion. IMPRESSION: 1. Progressive large right pleural effusion obscuring underlying airspace disease and tumor. 2. Stable Port-A-Cath. These results will be called to the ordering clinician or representative by the Radiologist Assistant, and communication documented in the PACS or zVision Dashboard. Electronically Signed   By: San Morelle M.D.   On: 03/30/2015 12:58   Ct Chest W Contrast  04/19/2015  CLINICAL DATA:  49 year old female with history of right-sided breast cancer diagnosed in 2013 with known bone metastasis. Status post radiation  therapy and chemotherapy, now complete. Chemotherapy scheduled to begin again on 04/20/2015. Status post mastectomy. Cough and shortness of breath for the past 3-4 months. EXAM: CT CHEST WITH CONTRAST TECHNIQUE: Multidetector CT imaging of the chest was performed during intravenous contrast administration. CONTRAST:  41mL OMNIPAQUE IOHEXOL 300 MG/ML  SOLN COMPARISON:  PET-CT 03/17/2015.  Chest CT 03/03/2015. FINDINGS: Mediastinum/Lymph Nodes: Heart size is normal. Small amount of pericardial fluid and/or thickening, similar to the prior study and unlikely to be of hemodynamic significance at this time. Worsening multifocal lymphadenopathy throughout the mediastinum, most evident in the anterior mediastinum where multiple borderline enlarged and mildly enlarged lymph nodes are noted measuring up to 1 cm. Subcarinal adenopathy also increased measuring up to 1 cm in short axis. Bulky lymphatic tissue at the right hilum, difficult to discretely measure, but measuring up to 12 mm in thickness. Esophagus is unremarkable in appearance. No axillary lymphadenopathy. No internal mammary lymphadenopathy. Left-sided internal jugular single-lumen porta cath with tip terminating in the distal superior vena cava. Lungs/Pleura: The previous described right middle lobe lesion is difficult to visualize on today's examination, but appears significantly increased in size, currently measuring approximately 5.9 x 3.8 cm (image 33 of series 2), currently occupying nearly the entire right middle lobe (with remaining portions atelectatic). Marked interval enlargement of a malignant right pleural effusion, with increasing areas of pleural thickening and enhancing pleural nodularity throughout the right hemithorax, most evident at the base of the right hemithorax where the largest pleural-based mass measures 1.8 x 4.3 cm (image 59 of series 2). Extensive passive atelectasis in portions of the right lower lobe and right upper lobe. Throughout  the aerated portions of the right upper and right lower lobes there is extensive septal thickening, somewhat nodular in appearance, concerning for worsening lymphangitic spread of tumor. There is also small left-sided pleural effusion which layers dependently and is simple in appearance. This is associated with some passive subsegmental atelectasis in the left lower lobe. Upper Abdomen: Multiple ill-defined hypovascular areas in the liver are new compared to prior studies, and appear concerning for probable widespread metastatic disease to the liver, the largest of which is estimated to measure approximately 1.7 x 1.2 cm in  segment 2 of the liver (image 55 of series 2). New right adrenal mass measuring 3.1 x 1.5 cm, presumably a metastatic lesion. Incompletely visualize soft tissue near the hepatic hilum immediately inferior to the portal vein measuring at least 1.5 x 2.8 cm (image 67 of series 2), likely to represent a metastatic hepatoduodenal ligament lymph node. Multiple borderline enlarged and mildly enlarged gastrohepatic lymph nodes measuring up to 1 cm in short axis. Multiple borderline enlarged celiac axis lymph nodes. Pneumoperitoneum visualized in the upper abdomen. Musculoskeletal/Soft Tissues: Ill-defined areas of sclerosis are noted in the manubrium and superior aspect of the sternum, similar to prior studies (nonspecific). Compression fracture of superior endplate of T9 is similar to the prior study, with approximately 25% loss of central vertebral body height on the left side. IMPRESSION: 1. Pneumoperitoneum. Correlation with CT of the abdomen and pelvis is suggested to identify potential source for this unexpected finding if clinically appropriate. 2. Marked progression of disease as evidenced by enlarging right middle lobe mass, markedly enlarged malignant right pleural effusion, and evidence of lymphangitic spread of disease throughout the right lung, with increasing right hilar and mediastinal  lymphadenopathy, as discussed above. In addition, there are likely multifocal new hepatic metastases, a right adrenal metastasis, and multiple upper abdominal lymph nodes, concerning for metastatic disease. 3. New small left pleural effusion with some passive subsegmental atelectasis in the left lower lobe. 4. Additional incidental findings, as above. Critical Value/emergent results were called by telephone at the time of interpretation on 04/19/2015 at 9:52 am to Dr. Lurline Del, who verbally acknowledged these results. Electronically Signed   By: Vinnie Langton M.D.   On: 04/19/2015 09:53   Ct Abdomen Pelvis W Contrast  04/19/2015  CLINICAL DATA:  Patient with history of metastatic breast cancer. On restaging chest CT earlier today there was evidence of pneumoperitoneum. Evaluation of the abdomen and pelvis. EXAM: CT ABDOMEN AND PELVIS WITH CONTRAST TECHNIQUE: Multidetector CT imaging of the abdomen and pelvis was performed using the standard protocol following bolus administration of intravenous contrast. CONTRAST:  81mL OMNIPAQUE IOHEXOL 300 MG/ML SOLN, 1 OMNIPAQUE IOHEXOL 300 MG/ML SOLN COMPARISON:  CT chest 04/19/2015; PET-CT 03/17/2015. FINDINGS: Lower chest: Patient had chest CT same day. See dedicated chest CT dictation of chest findings including right middle lobe mass, malignant right pleural effusion, lymphangitic spread of disease throughout the right lung and increasing adenopathy within the right hilum. Small left pleural effusion. Hepatobiliary: Interval development of innumerable low-attenuation lesions throughout the liver as well as interval increase in size of lesion within the central aspect of the liver measuring 1.4 x 2.1 cm (image 35; series 2), previously 1.3 x 1.1 cm. Reference lesion within the right hepatic lobe measures 1.5 x 1.1 cm (image 44; series 2). Reference lesion within the left hepatic lobe measures 1.0 x 1.0 cm (image 33; series 2). Pancreas: Unremarkable Spleen:  Unremarkable Adrenals/Urinary Tract: Interval increase in size of right adrenal metastasis measuring 3.2 cm, previously 1.4 cm (image 38; series 2). Left adrenal gland is unremarkable. Kidneys enhance symmetrically with contrast. No hydronephrosis. Urinary bladder is unremarkable. Stomach/Bowel: Small amount of fluid in the pelvis. Interval development of pneumatosis involving the cecum, ascending and proximal transverse colon. There is a small amount of free intraperitoneal air. Oral contrast material is demonstrated to the level of the descending colon. No evidence for bowel obstruction. Vascular/Lymphatic: Unchanged retrocrural adenopathy measuring up to 1.3 cm (image 33; series 2), previously 1.0 cm. Normal caliber abdominal aorta. Other: Uterus and adnexal  structures are unremarkable. Musculoskeletal: Compression fracture of the superior endplate of the T9 vertebral body, similar prior exam. IMPRESSION: Free intraperitoneal air is demonstrated. There is marked pneumatosis of the cecum, ascending and proximal transverse colon. Interval progression of disease with innumerable hepatic metastasis and enlarged right adrenal metastasis. Progression of metastatic disease within the chest as described on chest CT earlier same day. Critical Value/emergent results were called by telephone at the time of interpretation on 04/19/2015 at 6:43 pm to Dr. Lurline Del , who verbally acknowledged these results. Electronically Signed   By: Lovey Newcomer M.D.   On: 04/19/2015 18:47   US Thoracentesis Asp Pleural Space W/img Guide  03/30/2015  INDICATION: Metastatic breast cancer, dyspnea, recurrent right pleural effusion. Request is made for therapeutic right thoracentesis. EXAM: ULTRASOUND GUIDED THERAPEUTIC RIGHT THORACENTESIS COMPARISON:  Prior thoracentesis on 10/29/2014 MEDICATIONS: None COMPLICATIONS: None immediate TECHNIQUE: Informed written consent was obtained from the patient after a discussion of the risks,  benefits and alternatives to treatment. A timeout was performed prior to the initiation of the procedure. Initial ultrasound scanning demonstrates a moderate right pleural effusion. The lower chest was prepped and draped in the usual sterile fashion. 1% lidocaine was used for local anesthesia. An ultrasound image was saved for documentation purposes. A 6 Fr Safe-T-Centesis catheter was introduced. The thoracentesis was performed. The catheter was removed and a dressing was applied. The patient tolerated the procedure well without immediate post procedural complication. The patient was escorted to have an upright chest radiograph. FINDINGS: A total of approximately 900 cc of bloody fluid was removed. Due to the multi-septated nature of the pleural collection only the above amount of fluid could be removed today. IMPRESSION: Successful ultrasound-guided therapeutic right sided thoracentesis yielding 900 cc of pleural fluid. Read by: Rowe Robert, PA-C Electronically Signed   By: Jerilynn Mages.  Shick M.D.   On: 03/30/2015 15:42    Note: This dictation was prepared with Dragon/digital dictation along with [employer]. Any transcriptional errors that result from this process are unintentional.

## 2015-04-19 NOTE — Telephone Encounter (Signed)
Nicole Valentine called to "ensure Dr. [name] is aware I want to receive Eribulin tomorrow.  We discussed two options and I want him to know I want Eribulin."  Will notify Dr. [name].  On 04-20-2015, scheduled for lab at 1015, CDDP at 11:00 and MD F/U at 11:00.

## 2015-04-19 NOTE — Progress Notes (Signed)
Nicole Valentine was admitted-- see H&P for details

## 2015-04-20 ENCOUNTER — Ambulatory Visit: Payer: BLUE CROSS/BLUE SHIELD

## 2015-04-20 ENCOUNTER — Ambulatory Visit: Payer: BLUE CROSS/BLUE SHIELD | Admitting: Oncology

## 2015-04-20 ENCOUNTER — Other Ambulatory Visit: Payer: BLUE CROSS/BLUE SHIELD

## 2015-04-20 DIAGNOSIS — J90 Pleural effusion, not elsewhere classified: Secondary | ICD-10-CM

## 2015-04-20 DIAGNOSIS — E440 Moderate protein-calorie malnutrition: Secondary | ICD-10-CM | POA: Insufficient documentation

## 2015-04-20 LAB — CBC WITH DIFFERENTIAL/PLATELET
Basophils Absolute: 0 10*3/uL (ref 0.0–0.1)
Basophils Relative: 0 %
EOS ABS: 0.1 10*3/uL (ref 0.0–0.7)
Eosinophils Relative: 3 %
HEMATOCRIT: 31 % — AB (ref 36.0–46.0)
HEMOGLOBIN: 9.7 g/dL — AB (ref 12.0–15.0)
LYMPHS ABS: 0.4 10*3/uL — AB (ref 0.7–4.0)
LYMPHS PCT: 13 %
MCH: 32.3 pg (ref 26.0–34.0)
MCHC: 31.3 g/dL (ref 30.0–36.0)
MCV: 103.3 fL — AB (ref 78.0–100.0)
MONOS PCT: 8 %
Monocytes Absolute: 0.2 10*3/uL (ref 0.1–1.0)
NEUTROS ABS: 2.3 10*3/uL (ref 1.7–7.7)
NEUTROS PCT: 76 %
PLATELETS: 265 10*3/uL (ref 150–400)
RBC: 3 MIL/uL — AB (ref 3.87–5.11)
RDW: 16.3 % — ABNORMAL HIGH (ref 11.5–15.5)
WBC: 2.9 10*3/uL — AB (ref 4.0–10.5)

## 2015-04-20 LAB — URINE CULTURE

## 2015-04-20 LAB — BASIC METABOLIC PANEL
Anion gap: 7 (ref 5–15)
BUN: 6 mg/dL (ref 6–20)
CALCIUM: 8.2 mg/dL — AB (ref 8.9–10.3)
CO2: 30 mmol/L (ref 22–32)
CREATININE: 0.45 mg/dL (ref 0.44–1.00)
Chloride: 102 mmol/L (ref 101–111)
Glucose, Bld: 87 mg/dL (ref 65–99)
Potassium: 3.3 mmol/L — ABNORMAL LOW (ref 3.5–5.1)
SODIUM: 139 mmol/L (ref 135–145)

## 2015-04-20 LAB — AMYLASE: AMYLASE: 29 U/L (ref 28–100)

## 2015-04-20 LAB — LACTATE DEHYDROGENASE: LDH: 165 U/L (ref 98–192)

## 2015-04-20 LAB — MAGNESIUM: Magnesium: 1.7 mg/dL (ref 1.7–2.4)

## 2015-04-20 MED ORDER — DIPHENHYDRAMINE HCL 25 MG PO CAPS
25.0000 mg | ORAL_CAPSULE | Freq: Every evening | ORAL | Status: DC | PRN
  Administered 2015-04-20: 25 mg via ORAL
  Filled 2015-04-20: qty 1

## 2015-04-20 MED ORDER — POTASSIUM CHLORIDE 10 MEQ/100ML IV SOLN
10.0000 meq | INTRAVENOUS | Status: AC
  Administered 2015-04-20: 10 meq via INTRAVENOUS
  Filled 2015-04-20 (×2): qty 100

## 2015-04-20 MED ORDER — SODIUM CHLORIDE 0.9 % IV SOLN
1.4000 mg/m2 | Freq: Once | INTRAVENOUS | Status: AC
  Administered 2015-04-20: 2.6 mg via INTRAVENOUS
  Filled 2015-04-20: qty 5.2

## 2015-04-20 MED ORDER — SODIUM CHLORIDE 0.9 % IJ SOLN: 3.0000 mL | INTRAMUSCULAR | Status: DC | PRN

## 2015-04-20 MED ORDER — ALTEPLASE 2 MG IJ SOLR: 2.0000 mg | Freq: Once | INTRAMUSCULAR | Status: DC | PRN

## 2015-04-20 MED ORDER — SODIUM CHLORIDE 0.9 % IV SOLN
8.0000 mg | Freq: Two times a day (BID) | INTRAVENOUS | Status: DC
  Administered 2015-04-20 – 2015-04-21 (×3): 8 mg via INTRAVENOUS
  Filled 2015-04-20 (×3): qty 4

## 2015-04-20 MED ORDER — SODIUM CHLORIDE 0.9 % IV SOLN
Freq: Once | INTRAVENOUS | Status: AC
  Administered 2015-04-20: 13:00:00 via INTRAVENOUS
  Filled 2015-04-20: qty 4

## 2015-04-20 MED ORDER — HEPARIN SOD (PORK) LOCK FLUSH 100 UNIT/ML IV SOLN
250.0000 [IU] | Freq: Once | INTRAVENOUS | Status: DC | PRN
  Filled 2015-04-20: qty 3

## 2015-04-20 MED ORDER — SODIUM CHLORIDE 0.9 % IV SOLN
Freq: Once | INTRAVENOUS | Status: AC
  Administered 2015-04-20: 13:00:00 via INTRAVENOUS

## 2015-04-20 MED ORDER — DEXTROSE 5 % IV SOLN
2.0000 g | INTRAVENOUS | Status: DC
  Administered 2015-04-20: 2 g via INTRAVENOUS
  Filled 2015-04-20: qty 2

## 2015-04-20 MED ORDER — HEPARIN SOD (PORK) LOCK FLUSH 100 UNIT/ML IV SOLN
500.0000 [IU] | Freq: Once | INTRAVENOUS | Status: DC | PRN
  Filled 2015-04-20 (×2): qty 5

## 2015-04-20 MED ORDER — PRO-STAT SUGAR FREE PO LIQD
30.0000 mL | Freq: Two times a day (BID) | ORAL | Status: DC
  Administered 2015-04-20: 30 mL via ORAL
  Filled 2015-04-20 (×2): qty 30

## 2015-04-20 MED ORDER — SODIUM CHLORIDE 0.9 % IJ SOLN: 10.0000 mL | INTRAMUSCULAR | Status: DC | PRN

## 2015-04-20 NOTE — Progress Notes (Signed)
Initial Nutrition Assessment  DOCUMENTATION CODES:   Non-severe (moderate) malnutrition in context of acute illness/injury  INTERVENTION:  - Continue CLD and advance diet as medically feasible - Will order 30 mL Prostat BID, each supplement provides 100 kcal and 15 grams of protein. - RD will continue to monitor for needs  NUTRITION DIAGNOSIS:   Increased nutrient needs related to catabolic illness, cancer and cancer related treatments as evidenced by estimated needs.  GOAL:   Patient will meet greater than or equal to 90% of their needs  MONITOR:   PO intake, Supplement acceptance, Weight trends, Labs, I & O's  REASON FOR ASSESSMENT:   Malnutrition Screening Tool  ASSESSMENT:   49 year old female who recently completed her radiation treatments for metastatic breast cancer involving the brain, and is scheduled to start cis-platinum chemotherapy tomorrow. Today we obtained a restaging CT of the chest to serve as a new baseline. This does show significant progression of disease compared to prior. More urgently though it also showed pneumoperitoneum. Accordingly the patient has been recalled for evaluation and is being admitted for observation and treatment  Pt seen for MST. BMI indicates normal weight status. Pt reports that PTA she had a fair to poor appetite; she would snack throughout the day rather than eating large meals and this was working well for her. She denies chewing or swallowing issues but states she would often consume items that did not require extensive chewing as this was tiring for her. She was drinking smoothies frequently and inquires about protein powder; encouraged her to use a powder with whey protein as this is better utilized by the body. Pt asks if protein powders can be added to foods when cooking and this was encouraged if it is something she feels would be of interest to her. She was not using nutrition supplements such as Ensure or Boost PTA.  Pt denies  taste alterations PTA but states that sweet-tasting items did not taste good to her and so she avoided these items.   She states UBW of 180 lbs and that she last weighed this in May 2016. She states that recently her weight has been stable at 162 lbs. Chart review indicates 6 lb weight loss (4% body weight) in the past 1 week (7 days) which is significant for time frame. Will continue to monitor weight trends. Physical assessment deferred during this visit but will be done on follow-up visit.  Pt to start chemotherapy today. Diet advancement recently from NPO to CLD and pt reports no PO intakes x2 days. Not meeting needs. Medications reviewed. Labs reviewed; K: 3.3 mmol/L, Ca: 8.2 mg/dL.   Diet Order:  Diet clear liquid Room service appropriate?: Yes; Fluid consistency:: Thin  Skin:  Reviewed, no issues  Last BM:  11/28  Height:   Ht Readings from Last 1 Encounters:  04/19/15 5' 7.5" (1.715 m)    Weight:   Wt Readings from Last 1 Encounters:  04/19/15 157 lb (71.215 kg)    Ideal Body Weight:  61.36 kg (kg)  BMI:  Body mass index is 24.21 kg/(m^2).  Estimated Nutritional Needs:   Kcal:  2130-2490  Protein:  85-95 grams  Fluid:  >/= 2.2 L/day  EDUCATION NEEDS:   No education needs identified at this time     Jarome Matin, RD, LDN Inpatient Clinical Dietitian Pager # [phone] After hours/weekend pager # [phone]

## 2015-04-20 NOTE — Progress Notes (Signed)
Contact Oncall Oncologist. Pt requested medication for sleep. Dr. Benay Spice ordered benadryl 25mg  qHS.

## 2015-04-20 NOTE — Progress Notes (Signed)
Nicole Valentine   DOB:Nov 04, 1965   ZO#:[id number]   JWJ#:[id number]  Subjective: didn't lseep well-- lots of noise. Feels the oxygen is helping. No abd pain, cramps or discomfort. No family in room   Objective:  Middle aged White woman examined in bed Filed Vitals:   04/19/15 2029 04/20/15 0431  BP: 145/85 147/88  Pulse: 88 98  Temp: 98.1 F (36.7 C) 97.8 F (36.6 C)  Resp: 18 18    Body mass index is 24.21 kg/(m^2).  Intake/Output Summary (Last 24 hours) at 04/20/15 0800 Last data filed at 04/20/15 0435  Gross per 24 hour  Intake 1717.5 ml  Output   3250 ml  Net -1532.5 ml     Sclerae unicteric  No peripheral adenopathy  No BS R base, as preiously noted  Heart regular rate and rhythm  Abdomen soft, NT all quadrants  Neuro nonfocal  Breast exam: deferred  CBG (last 3)  No results for input(s): GLUCAP in the last 72 hours.   Labs:  Lab Results  Component Value Date   WBC 2.9* 04/20/2015   HGB 9.7* 04/20/2015   HCT 31.0* 04/20/2015   MCV 103.3* 04/20/2015   PLT 265 04/20/2015   NEUTROABS 2.3 04/20/2015    _0 @  Urine Studies No results for input(s): UHGB, CRYS in the last 72 hours.  Invalid input(s): UACOL, UAPR, USPG, UPH, UTP, UGL, UKET, UBIL, UNIT, UROB, ULEU, UEPI, UWBC, URBC, UBAC, CAST, UCOM, BILUA  Basic Metabolic Panel:  Recent Labs Lab 04/19/15 1300 04/20/15 0400  NA 141 139  K 3.2* 3.3*  CL 103 102  CO2 30 30  GLUCOSE 84 87  BUN 10 6  CREATININE 0.49 0.45  CALCIUM 8.5* 8.2*   GFR Estimated Creatinine Clearance: 84.3 mL/min (by C-G formula based on Cr of 0.45). Liver Function Tests:  Recent Labs Lab 04/19/15 1300  AST 18  ALT 10*  ALKPHOS 105  BILITOT 0.7  PROT 6.3*  ALBUMIN 2.8*    Recent Labs Lab 04/20/15 0400  AMYLASE 29   No results for input(s): AMMONIA in the last 168 hours. Coagulation profile  Recent Labs Lab 04/19/15 1300  INR 1.28    CBC:  Recent Labs Lab 04/19/15 1300 04/20/15 0400  WBC  3.4* 2.9*  NEUTROABS 2.6 2.3  HGB 9.7* 9.7*  HCT 30.6* 31.0*  MCV 102.0* 103.3*  PLT 265 265   Cardiac Enzymes: No results for input(s): CKTOTAL, CKMB, CKMBINDEX, TROPONINI in the last 168 hours. BNP: Invalid input(s): POCBNP CBG: No results for input(s): GLUCAP in the last 168 hours. D-Dimer No results for input(s): DDIMER in the last 72 hours. Hgb A1c No results for input(s): HGBA1C in the last 72 hours. Lipid Profile No results for input(s): CHOL, HDL, LDLCALC, TRIG, CHOLHDL, LDLDIRECT in the last 72 hours. Thyroid function studies  Recent Labs  04/19/15 1300  TSH 3.635   Anemia work up No results for input(s): VITAMINB12, FOLATE, FERRITIN, TIBC, IRON, RETICCTPCT in the last 72 hours. Microbiology No results found for this or any previous visit (from the past 240 hour(s)).    Studies:  Ct Chest W Contrast  04/19/2015  CLINICAL DATA:  49 year old female with history of right-sided breast cancer diagnosed in 2013 with known bone metastasis. Status post radiation therapy and chemotherapy, now complete. Chemotherapy scheduled to begin again on 04/20/2015. Status post mastectomy. Cough and shortness of breath for the past 3-4 months. EXAM: CT CHEST WITH CONTRAST TECHNIQUE: Multidetector CT imaging of the chest was performed during intravenous  contrast administration. CONTRAST:  48m OMNIPAQUE IOHEXOL 300 MG/ML  SOLN COMPARISON:  PET-CT 03/17/2015.  Chest CT 03/03/2015. FINDINGS: Mediastinum/Lymph Nodes: Heart size is normal. Small amount of pericardial fluid and/or thickening, similar to the prior study and unlikely to be of hemodynamic significance at this time. Worsening multifocal lymphadenopathy throughout the mediastinum, most evident in the anterior mediastinum where multiple borderline enlarged and mildly enlarged lymph nodes are noted measuring up to 1 cm. Subcarinal adenopathy also increased measuring up to 1 cm in short axis. Bulky lymphatic tissue at the right hilum,  difficult to discretely measure, but measuring up to 12 mm in thickness. Esophagus is unremarkable in appearance. No axillary lymphadenopathy. No internal mammary lymphadenopathy. Left-sided internal jugular single-lumen porta cath with tip terminating in the distal superior vena cava. Lungs/Pleura: The previous described right middle lobe lesion is difficult to visualize on today's examination, but appears significantly increased in size, currently measuring approximately 5.9 x 3.8 cm (image 33 of series 2), currently occupying nearly the entire right middle lobe (with remaining portions atelectatic). Marked interval enlargement of a malignant right pleural effusion, with increasing areas of pleural thickening and enhancing pleural nodularity throughout the right hemithorax, most evident at the base of the right hemithorax where the largest pleural-based mass measures 1.8 x 4.3 cm (image 59 of series 2). Extensive passive atelectasis in portions of the right lower lobe and right upper lobe. Throughout the aerated portions of the right upper and right lower lobes there is extensive septal thickening, somewhat nodular in appearance, concerning for worsening lymphangitic spread of tumor. There is also small left-sided pleural effusion which layers dependently and is simple in appearance. This is associated with some passive subsegmental atelectasis in the left lower lobe. Upper Abdomen: Multiple ill-defined hypovascular areas in the liver are new compared to prior studies, and appear concerning for probable widespread metastatic disease to the liver, the largest of which is estimated to measure approximately 1.7 x 1.2 cm in segment 2 of the liver (image 55 of series 2). New right adrenal mass measuring 3.1 x 1.5 cm, presumably a metastatic lesion. Incompletely visualize soft tissue near the hepatic hilum immediately inferior to the portal vein measuring at least 1.5 x 2.8 cm (image 67 of series 2), likely to represent  a metastatic hepatoduodenal ligament lymph node. Multiple borderline enlarged and mildly enlarged gastrohepatic lymph nodes measuring up to 1 cm in short axis. Multiple borderline enlarged celiac axis lymph nodes. Pneumoperitoneum visualized in the upper abdomen. Musculoskeletal/Soft Tissues: Ill-defined areas of sclerosis are noted in the manubrium and superior aspect of the sternum, similar to prior studies (nonspecific). Compression fracture of superior endplate of T9 is similar to the prior study, with approximately 25% loss of central vertebral body height on the left side. IMPRESSION: 1. Pneumoperitoneum. Correlation with CT of the abdomen and pelvis is suggested to identify potential source for this unexpected finding if clinically appropriate. 2. Marked progression of disease as evidenced by enlarging right middle lobe mass, markedly enlarged malignant right pleural effusion, and evidence of lymphangitic spread of disease throughout the right lung, with increasing right hilar and mediastinal lymphadenopathy, as discussed above. In addition, there are likely multifocal new hepatic metastases, a right adrenal metastasis, and multiple upper abdominal lymph nodes, concerning for metastatic disease. 3. New small left pleural effusion with some passive subsegmental atelectasis in the left lower lobe. 4. Additional incidental findings, as above. Critical Value/emergent results were called by telephone at the time of interpretation on 04/19/2015 at 9:52 am  to Dr. Lurline Del, who verbally acknowledged these results. Electronically Signed   By: Vinnie Langton M.D.   On: 04/19/2015 09:53   Ct Abdomen Pelvis W Contrast  04/19/2015  CLINICAL DATA:  Patient with history of metastatic breast cancer. On restaging chest CT earlier today there was evidence of pneumoperitoneum. Evaluation of the abdomen and pelvis. EXAM: CT ABDOMEN AND PELVIS WITH CONTRAST TECHNIQUE: Multidetector CT imaging of the abdomen and  pelvis was performed using the standard protocol following bolus administration of intravenous contrast. CONTRAST:  65m OMNIPAQUE IOHEXOL 300 MG/ML SOLN, 1 OMNIPAQUE IOHEXOL 300 MG/ML SOLN COMPARISON:  CT chest 04/19/2015; PET-CT 03/17/2015. FINDINGS: Lower chest: Patient had chest CT same day. See dedicated chest CT dictation of chest findings including right middle lobe mass, malignant right pleural effusion, lymphangitic spread of disease throughout the right lung and increasing adenopathy within the right hilum. Small left pleural effusion. Hepatobiliary: Interval development of innumerable low-attenuation lesions throughout the liver as well as interval increase in size of lesion within the central aspect of the liver measuring 1.4 x 2.1 cm (image 35; series 2), previously 1.3 x 1.1 cm. Reference lesion within the right hepatic lobe measures 1.5 x 1.1 cm (image 44; series 2). Reference lesion within the left hepatic lobe measures 1.0 x 1.0 cm (image 33; series 2). Pancreas: Unremarkable Spleen: Unremarkable Adrenals/Urinary Tract: Interval increase in size of right adrenal metastasis measuring 3.2 cm, previously 1.4 cm (image 38; series 2). Left adrenal gland is unremarkable. Kidneys enhance symmetrically with contrast. No hydronephrosis. Urinary bladder is unremarkable. Stomach/Bowel: Small amount of fluid in the pelvis. Interval development of pneumatosis involving the cecum, ascending and proximal transverse colon. There is a small amount of free intraperitoneal air. Oral contrast material is demonstrated to the level of the descending colon. No evidence for bowel obstruction. Vascular/Lymphatic: Unchanged retrocrural adenopathy measuring up to 1.3 cm (image 33; series 2), previously 1.0 cm. Normal caliber abdominal aorta. Other: Uterus and adnexal structures are unremarkable. Musculoskeletal: Compression fracture of the superior endplate of the T9 vertebral body, similar prior exam. IMPRESSION: Free  intraperitoneal air is demonstrated. There is marked pneumatosis of the cecum, ascending and proximal transverse colon. Interval progression of disease with innumerable hepatic metastasis and enlarged right adrenal metastasis. Progression of metastatic disease within the chest as described on chest CT earlier same day. Critical Value/emergent results were called by telephone at the time of interpretation on 04/19/2015 at 6:43 pm to Dr. GLurline Del, who verbally acknowledged these results. Electronically Signed   By: DLovey NewcomerM.D.   On: 04/19/2015 18:47    Assessment: 49y.o. BRCA negative [place] woman with triple-negative stage IV breast cancer  (1) an status post right breast upper outer quadrant and right axillary lymph node biopsy 04/04/2012, both positive for an invasive ductal carcinoma, grade 3, triple negative, with an MIB-1 of 25%  (2) Treated neoadjuvantly with (a) fluorouracil, cyclophosphamide, and epirubicin (at 100 mg/M2) x4 completed 06/07/2012 (b) docetaxel (75 mg/M2) for one dose, 06/21/2012, poorly tolerated (c) carboplatin and gemcitabine given every 21 days for 6 cycles completed 10/11/2012  (3) status post right modified radical mastectomy 11/18/2012 showing a complete pathologic response--all 16 lymph nodes were benign (a) no plans for reconstructon  (4) adjuvant radiation therapy completed 03/06/2013  METASTATIC DISEASE June 2016 (5) 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, 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  (6) staging PET scan 11/06/2014 showed significant disease in the middle and lower lobes of the Right lung, Right  effusion, multiple liver and bone lesions, as well as left lung nodules, a Right adrenal nodule, and widespread hypermetabolic adenopathy (a) brain MRI 11/16/2014 showed multiple cerebellar lesions  (7) RADIATION TREATMENTS thoracic metastases (a)  01/28/2015-02/11/2015: Thoracic Spine T8-10, 30 Gy in 10 fractions  (7) abraxane day 1 and day 8 of each 21 day cycle started 11/09/2014: last dose 03/12/2015 (6 cycles) (a) PET scan 01/05/2015 shows an excellent initial response after 3 cycles (b) PET scan 03/17/2015 shows progression  (8) zolendronate started 11/16/2014, to be repeated every 12 weeks, most recent dose 02/09/2015  (9) Foundation 1 study sent 11/06/2014: patient's sister in law Garvin Fila following [phone]) (a) 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[place], NTRK1, MYC, ARID1A and LYN, none w approved therapies (b) pazopanib, ponatinib or crizotinib suggested as possible off-protocol options  (10) RADIATION TREATMENTS: Brain metastases: (a) s/p SRS therapy 12/11/2014 to 4 right cerebellar lesions (b) multiple (18+) lesions noted on repeat brain MRI 03/16/2015 (c) whole brain irradiation to be completed 04/13/2015  (11) uncontrolled pain: Started on OxyContin 10 mg twice a day with oxycodone 5 mg as needed 03/26/2015  (12) Right pleural effusion (a) s/p thoracentesis July 2016 (x2), 03/30/2015  (13) to start cisplatin 04/27/2015, repeated every 21 days  (14) pneumoperitoneum noted on restaging chest CT scan 04/19/2015     Plan: in the absence of any abd symptoms and with normal labs I have to conclude the pneumoperitoneum is caused by her pneumatosis, and that in turn by her Right lung dysfunction. I don't think we are dealing with luminal perforation or bacterial invasion of the colon wall. A conservative approach would be antibiotics and observation. Will wait on surgery's final recommendations, meantime continue ceftriaxon/ metronidazole as ordered  However we do need to move on w chemo and in that regard Jerzey and her husband [name] have decided for eribulin instead of cis-platinum. I think this is a good decision-- eribulin is  much better tolerated and we don't have a head to head comparison between these agents in this situation--either would be reasonable. We will start today.  She is very eager to go home. Perhaps if there is no clnical change by tomorrow morning we can consider d/c on oral flagyl w close follow up     [place], MD 04/20/2015  8:00 AM

## 2015-04-20 NOTE — Progress Notes (Signed)
Patient ID: Nicole Valentine, female   DOB: [birth year], 49 y.o.   MRN: [id number]    Subjective: No new complaints today.  No abdominal pain  Objective: Vital signs in last 24 hours: Temp:  [97.8 F (36.6 C)-98.1 F (36.7 C)] 97.8 F (36.6 C) (11/29 0431) Pulse Rate:  [84-98] 98 (11/29 0431) Resp:  [18-20] 18 (11/29 0431) BP: (126-147)/(80-88) 147/88 mmHg (11/29 0431) SpO2:  [91 %-97 %] 97 % (11/29 0549) Weight:  [71.215 kg (157 lb)-74.163 kg (163 lb 8 oz)] 71.215 kg (157 lb) (11/28 1320) Last BM Date: 04/19/15  Intake/Output from previous day: 11/28 0701 - 11/29 0700 In: 1717.5 [I.V.:1667.5; IV Piggyback:50] Out: [id number] [Urine:3250] Intake/Output this shift:    PE: Abd: soft, NT, ND, +BS Heart: regular  Lab Results:   Recent Labs  04/19/15 1300 04/20/15 0400  WBC 3.4* 2.9*  HGB 9.7* 9.7*  HCT 30.6* 31.0*  PLT 265 265   BMET  Recent Labs  04/19/15 1300 04/20/15 0400  NA 141 139  K 3.2* 3.3*  CL 103 102  CO2 30 30  GLUCOSE 84 87  BUN 10 6  CREATININE 0.49 0.45  CALCIUM 8.5* 8.2*   PT/INR  Recent Labs  04/19/15 1300  LABPROT 16.1*  INR 1.28   CMP     Component Value Date/Time   NA 139 04/20/2015 0400   NA 142 03/22/2015 0802   K 3.3* 04/20/2015 0400   K 4.0 03/22/2015 0802   CL 102 04/20/2015 0400   CL 101 10/18/2012 0859   CO2 30 04/20/2015 0400   CO2 29 03/22/2015 0802   GLUCOSE 87 04/20/2015 0400   GLUCOSE 100 03/22/2015 0802   GLUCOSE 83 10/18/2012 0859   BUN 6 04/20/2015 0400   BUN 5.6* 03/22/2015 0802   CREATININE 0.45 04/20/2015 0400   CREATININE 0.7 03/22/2015 0802   CALCIUM 8.2* 04/20/2015 0400   CALCIUM 9.0 03/22/2015 0802   PROT 6.3* 04/19/2015 1300   PROT 6.1* 03/22/2015 0802   ALBUMIN 2.8* 04/19/2015 1300   ALBUMIN 3.2* 03/22/2015 0802   AST 18 04/19/2015 1300   AST 15 03/22/2015 0802   ALT 10* 04/19/2015 1300   ALT 11 03/22/2015 0802   ALKPHOS 105 04/19/2015 1300   ALKPHOS 142 03/22/2015 0802   BILITOT 0.7 04/19/2015  1300   BILITOT 0.42 03/22/2015 0802   GFRNONAA >60 04/20/2015 0400   GFRAA >60 04/20/2015 0400   Lipase  No results found for: LIPASE     Studies/Results: Ct Chest W Contrast  04/19/2015  CLINICAL DATA:  49 year old female with history of right-sided breast cancer diagnosed in 2013 with known bone metastasis. Status post radiation therapy and chemotherapy, now complete. Chemotherapy scheduled to begin again on 04/20/2015. Status post mastectomy. Cough and shortness of breath for the past 3-4 months. EXAM: CT CHEST WITH CONTRAST TECHNIQUE: Multidetector CT imaging of the chest was performed during intravenous contrast administration. CONTRAST:  73mL OMNIPAQUE IOHEXOL 300 MG/ML  SOLN COMPARISON:  PET-CT 03/17/2015.  Chest CT 03/03/2015. FINDINGS: Mediastinum/Lymph Nodes: Heart size is normal. Small amount of pericardial fluid and/or thickening, similar to the prior study and unlikely to be of hemodynamic significance at this time. Worsening multifocal lymphadenopathy throughout the mediastinum, most evident in the anterior mediastinum where multiple borderline enlarged and mildly enlarged lymph nodes are noted measuring up to 1 cm. Subcarinal adenopathy also increased measuring up to 1 cm in short axis. Bulky lymphatic tissue at the right hilum, difficult to discretely measure, but measuring up to  12 mm in thickness. Esophagus is unremarkable in appearance. No axillary lymphadenopathy. No internal mammary lymphadenopathy. Left-sided internal jugular single-lumen porta cath with tip terminating in the distal superior vena cava. Lungs/Pleura: The previous described right middle lobe lesion is difficult to visualize on today's examination, but appears significantly increased in size, currently measuring approximately 5.9 x 3.8 cm (image 33 of series 2), currently occupying nearly the entire right middle lobe (with remaining portions atelectatic). Marked interval enlargement of a malignant right pleural  effusion, with increasing areas of pleural thickening and enhancing pleural nodularity throughout the right hemithorax, most evident at the base of the right hemithorax where the largest pleural-based mass measures 1.8 x 4.3 cm (image 59 of series 2). Extensive passive atelectasis in portions of the right lower lobe and right upper lobe. Throughout the aerated portions of the right upper and right lower lobes there is extensive septal thickening, somewhat nodular in appearance, concerning for worsening lymphangitic spread of tumor. There is also small left-sided pleural effusion which layers dependently and is simple in appearance. This is associated with some passive subsegmental atelectasis in the left lower lobe. Upper Abdomen: Multiple ill-defined hypovascular areas in the liver are new compared to prior studies, and appear concerning for probable widespread metastatic disease to the liver, the largest of which is estimated to measure approximately 1.7 x 1.2 cm in segment 2 of the liver (image 55 of series 2). New right adrenal mass measuring 3.1 x 1.5 cm, presumably a metastatic lesion. Incompletely visualize soft tissue near the hepatic hilum immediately inferior to the portal vein measuring at least 1.5 x 2.8 cm (image 67 of series 2), likely to represent a metastatic hepatoduodenal ligament lymph node. Multiple borderline enlarged and mildly enlarged gastrohepatic lymph nodes measuring up to 1 cm in short axis. Multiple borderline enlarged celiac axis lymph nodes. Pneumoperitoneum visualized in the upper abdomen. Musculoskeletal/Soft Tissues: Ill-defined areas of sclerosis are noted in the manubrium and superior aspect of the sternum, similar to prior studies (nonspecific). Compression fracture of superior endplate of T9 is similar to the prior study, with approximately 25% loss of central vertebral body height on the left side. IMPRESSION: 1. Pneumoperitoneum. Correlation with CT of the abdomen and pelvis  is suggested to identify potential source for this unexpected finding if clinically appropriate. 2. Marked progression of disease as evidenced by enlarging right middle lobe mass, markedly enlarged malignant right pleural effusion, and evidence of lymphangitic spread of disease throughout the right lung, with increasing right hilar and mediastinal lymphadenopathy, as discussed above. In addition, there are likely multifocal new hepatic metastases, a right adrenal metastasis, and multiple upper abdominal lymph nodes, concerning for metastatic disease. 3. New small left pleural effusion with some passive subsegmental atelectasis in the left lower lobe. 4. Additional incidental findings, as above. Critical Value/emergent results were called by telephone at the time of interpretation on 04/19/2015 at 9:52 am to Dr. Lurline Del, who verbally acknowledged these results. Electronically Signed   By: Vinnie Langton M.D.   On: 04/19/2015 09:53   Ct Abdomen Pelvis W Contrast  04/19/2015  CLINICAL DATA:  Patient with history of metastatic breast cancer. On restaging chest CT earlier today there was evidence of pneumoperitoneum. Evaluation of the abdomen and pelvis. EXAM: CT ABDOMEN AND PELVIS WITH CONTRAST TECHNIQUE: Multidetector CT imaging of the abdomen and pelvis was performed using the standard protocol following bolus administration of intravenous contrast. CONTRAST:  89mL OMNIPAQUE IOHEXOL 300 MG/ML SOLN, 1 OMNIPAQUE IOHEXOL 300 MG/ML SOLN COMPARISON:  CT chest 04/19/2015; PET-CT 03/17/2015. FINDINGS: Lower chest: Patient had chest CT same day. See dedicated chest CT dictation of chest findings including right middle lobe mass, malignant right pleural effusion, lymphangitic spread of disease throughout the right lung and increasing adenopathy within the right hilum. Small left pleural effusion. Hepatobiliary: Interval development of innumerable low-attenuation lesions throughout the liver as well as interval  increase in size of lesion within the central aspect of the liver measuring 1.4 x 2.1 cm (image 35; series 2), previously 1.3 x 1.1 cm. Reference lesion within the right hepatic lobe measures 1.5 x 1.1 cm (image 44; series 2). Reference lesion within the left hepatic lobe measures 1.0 x 1.0 cm (image 33; series 2). Pancreas: Unremarkable Spleen: Unremarkable Adrenals/Urinary Tract: Interval increase in size of right adrenal metastasis measuring 3.2 cm, previously 1.4 cm (image 38; series 2). Left adrenal gland is unremarkable. Kidneys enhance symmetrically with contrast. No hydronephrosis. Urinary bladder is unremarkable. Stomach/Bowel: Small amount of fluid in the pelvis. Interval development of pneumatosis involving the cecum, ascending and proximal transverse colon. There is a small amount of free intraperitoneal air. Oral contrast material is demonstrated to the level of the descending colon. No evidence for bowel obstruction. Vascular/Lymphatic: Unchanged retrocrural adenopathy measuring up to 1.3 cm (image 33; series 2), previously 1.0 cm. Normal caliber abdominal aorta. Other: Uterus and adnexal structures are unremarkable. Musculoskeletal: Compression fracture of the superior endplate of the T9 vertebral body, similar prior exam. IMPRESSION: Free intraperitoneal air is demonstrated. There is marked pneumatosis of the cecum, ascending and proximal transverse colon. Interval progression of disease with innumerable hepatic metastasis and enlarged right adrenal metastasis. Progression of metastatic disease within the chest as described on chest CT earlier same day. Critical Value/emergent results were called by telephone at the time of interpretation on 04/19/2015 at 6:43 pm to Dr. Lurline Del , who verbally acknowledged these results. Electronically Signed   By: Lovey Newcomer M.D.   On: 04/19/2015 18:47    Anti-infectives: Anti-infectives    Start     Dose/Rate Route Frequency Ordered Stop   04/20/15  2200  cefTRIAXone (ROCEPHIN) 2 g in dextrose 5 % 50 mL IVPB     2 g 100 mL/hr over 30 Minutes Intravenous Every 24 hours 04/20/15 0824 04/24/15 2159   04/19/15 2200  metroNIDAZOLE (FLAGYL) tablet 500 mg     500 mg Oral 3 times per day 04/19/15 1916     04/19/15 2200  cefTRIAXone (ROCEPHIN) 2 g in dextrose 5 % 50 mL IVPB  Status:  Discontinued    Comments:  Pharmacy may adjust dosing strength / duration / interval for maximal efficacy   2 g 100 mL/hr over 30 Minutes Intravenous Every 24 hours 04/19/15 2055 04/20/15 0826   04/19/15 2000  cefTRIAXone (ROCEPHIN) 1 g in dextrose 5 % 50 mL IVPB  Status:  Discontinued     1 g 100 mL/hr over 30 Minutes Intravenous  Once 04/19/15 1916 04/19/15 2055       Assessment/Plan  1. Pneumoperitoneum with pneumatosis of right and transverse colon  -the patient has marked pneumatosis of her right and transverse colon, but is completely asymptomatic.  She has been started on Rocephin/Flagyl.  Dr. Excell Seltzer recommends initially NPO for bowel rest given these findings.  It is not clear why she has this finding on her CT scan. -Dr. [name] would like to start her chemo today.  I will have to d/w Dr. Excell Seltzer to see how he feels about this.  LOS: 1 day    Uva Runkel E 04/20/2015, 8:56 AM Pager: [phone]

## 2015-04-20 NOTE — Telephone Encounter (Signed)
Patient admitted 04-19-2015.

## 2015-04-21 ENCOUNTER — Inpatient Hospital Stay (HOSPITAL_COMMUNITY): Payer: BLUE CROSS/BLUE SHIELD

## 2015-04-21 ENCOUNTER — Ambulatory Visit: Payer: BLUE CROSS/BLUE SHIELD

## 2015-04-21 DIAGNOSIS — Q7959 Other congenital malformations of abdominal wall: Secondary | ICD-10-CM

## 2015-04-21 DIAGNOSIS — K219 Gastro-esophageal reflux disease without esophagitis: Secondary | ICD-10-CM

## 2015-04-21 DIAGNOSIS — K6389 Other specified diseases of intestine: Secondary | ICD-10-CM

## 2015-04-21 DIAGNOSIS — E668 Other obesity: Secondary | ICD-10-CM

## 2015-04-21 DIAGNOSIS — E46 Unspecified protein-calorie malnutrition: Secondary | ICD-10-CM

## 2015-04-21 DIAGNOSIS — J910 Malignant pleural effusion: Secondary | ICD-10-CM

## 2015-04-21 LAB — CBC WITH DIFFERENTIAL/PLATELET
BASOS PCT: 0 %
Basophils Absolute: 0 10*3/uL (ref 0.0–0.1)
Eosinophils Absolute: 0 10*3/uL (ref 0.0–0.7)
Eosinophils Relative: 0 %
HEMATOCRIT: 33.4 % — AB (ref 36.0–46.0)
Hemoglobin: 10.6 g/dL — ABNORMAL LOW (ref 12.0–15.0)
LYMPHS ABS: 0.2 10*3/uL — AB (ref 0.7–4.0)
Lymphocytes Relative: 5 %
MCH: 31.5 pg (ref 26.0–34.0)
MCHC: 31.7 g/dL (ref 30.0–36.0)
MCV: 99.4 fL (ref 78.0–100.0)
MONO ABS: 0.3 10*3/uL (ref 0.1–1.0)
MONOS PCT: 6 %
NEUTROS ABS: 4 10*3/uL (ref 1.7–7.7)
Neutrophils Relative %: 89 %
Platelets: 265 10*3/uL (ref 150–400)
RBC: 3.36 MIL/uL — ABNORMAL LOW (ref 3.87–5.11)
RDW: 15.5 % (ref 11.5–15.5)
WBC: 4.5 10*3/uL (ref 4.0–10.5)

## 2015-04-21 LAB — COMPREHENSIVE METABOLIC PANEL
ALBUMIN: 2.6 g/dL — AB (ref 3.5–5.0)
ALT: 10 U/L — ABNORMAL LOW (ref 14–54)
ANION GAP: 8 (ref 5–15)
AST: 17 U/L (ref 15–41)
Alkaline Phosphatase: 94 U/L (ref 38–126)
BILIRUBIN TOTAL: 0.6 mg/dL (ref 0.3–1.2)
BUN: 10 mg/dL (ref 6–20)
CALCIUM: 8.3 mg/dL — AB (ref 8.9–10.3)
CO2: 28 mmol/L (ref 22–32)
Chloride: 101 mmol/L (ref 101–111)
Creatinine, Ser: 0.48 mg/dL (ref 0.44–1.00)
GLUCOSE: 119 mg/dL — AB (ref 65–99)
POTASSIUM: 3.5 mmol/L (ref 3.5–5.1)
Sodium: 137 mmol/L (ref 135–145)
TOTAL PROTEIN: 6.5 g/dL (ref 6.5–8.1)

## 2015-04-21 MED ORDER — HEPARIN SOD (PORK) LOCK FLUSH 100 UNIT/ML IV SOLN
500.0000 [IU] | INTRAVENOUS | Status: DC | PRN
  Administered 2015-04-21: 500 [IU]

## 2015-04-21 MED ORDER — PROMETHAZINE-CODEINE 6.25-10 MG/5ML PO SYRP: 5.0000 mL | ORAL_SOLUTION | Freq: Four times a day (QID) | ORAL | Status: DC | PRN

## 2015-04-21 MED ORDER — METRONIDAZOLE 500 MG PO TABS: 500.0000 mg | ORAL_TABLET | Freq: Three times a day (TID) | ORAL | Status: DC

## 2015-04-21 MED ORDER — HEPARIN SOD (PORK) LOCK FLUSH 100 UNIT/ML IV SOLN: 500.0000 [IU] | INTRAVENOUS | Status: DC

## 2015-04-21 MED ORDER — PANTOPRAZOLE SODIUM 40 MG PO TBEC: 40.0000 mg | DELAYED_RELEASE_TABLET | Freq: Every day | ORAL | Status: DC

## 2015-04-21 NOTE — Progress Notes (Signed)
Home 02 ordered by MD. Desaturation screen done by nursing. [employer] rep contacted for home 02 referral. No other DC needs communicated. Marney Doctor RN,BSN,NCM [phone]

## 2015-04-21 NOTE — Progress Notes (Signed)
SATURATION QUALIFICATIONS: (This note is used to comply with regulatory documentation for home oxygen)  Patient Saturations on Room Air at Rest = 88%  Patient Saturations on Room Air while Ambulating = 92%  Patient Saturations on 2 Liters of oxygen while Ambulating = 96%  Please briefly explain why patient needs home oxygen when:Patient coughs frequently, when she coughs  her saturations goes down to 81%  on room air

## 2015-04-21 NOTE — Discharge Summary (Signed)
Physician Discharge Summary  Patient ID: Nicole Valentine MRN: [id number] DOB/AGE: Nov 12, 1965 49 y.o.  Admit date: 04/19/2015 Discharge date: 04/21/2015  Primary Care Physician:  [name], MD   Discharge Diagnoses:  Pneumoperitoneum, colonic pneumatosis,  Present on Admission:  . Breast cancer of upper-outer quadrant of right female breast ([facility]) . Breast cancer metastasized to bone ([place]) . Breast cancer metastasized to multiple sites [facility]) . Uncontrolled pain . GERD (gastroesophageal reflux disease) . Brain metastases ([place]) . Malignant pleural effusion  Discharge Medications:    Medication List    STOP taking these medications        oxyCODONE 10 mg 12 hr tablet  Commonly known as:  OXYCONTIN     oxycodone 5 MG capsule  Commonly known as:  OXY-IR      TAKE these medications        ALEVE 220 MG tablet  Generic drug:  naproxen sodium  Take 2 tablets (440 mg total) by mouth 2 (two) times daily with a meal.     cholecalciferol 1000 UNITS tablet  Commonly known as:  VITAMIN D  Take 2,000 Units by mouth 2 (two) times daily.     dextromethorphan-guaiFENesin 30-600 MG 12hr tablet  Commonly known as:  MUCINEX DM  Take 1 tablet by mouth 2 (two) times daily as needed for cough.     levothyroxine 175 MCG tablet  Commonly known as:  SYNTHROID, LEVOTHROID  Take 175 mcg by mouth daily before breakfast.     lidocaine-prilocaine cream  Commonly known as:  EMLA  Apply to affected area once     loratadine-pseudoephedrine 5-120 MG tablet  Commonly known as:  CLARITIN-D 12-hour  Take 1 tablet by mouth 2 (two) times daily.     LORazepam 0.5 MG tablet  Commonly known as:  ATIVAN  Take 1 tablet (0.5 mg total) by mouth every 6 (six) hours as needed (Nausea or vomiting).     metroNIDAZOLE 500 MG tablet  Commonly known as:  FLAGYL  Take 1 tablet (500 mg total) by mouth every 8 (eight) hours.     pantoprazole 40 MG tablet  Commonly known as:  PROTONIX   Take 1 tablet (40 mg total) by mouth daily at 12 noon.         Disposition and Follow-up:  Home, with return to clinic 04/28/2015 for day 8 chemo dose  Significant Diagnostic Studies:  Dg Chest 1 View  03/30/2015  CLINICAL DATA:  Patient status post right thoracentesis. History shortness of breath. EXAM: CHEST 1 VIEW COMPARISON:  Chest radiograph 03/30/2015 FINDINGS: Left anterior chest wall Port-A-Cath is present with tip projecting over the superior vena cava. Stable cardiac and mediastinal contours. Unchanged moderate to large right pleural effusion with underlying pulmonary consolidation. Possible tiny left pleural effusion. Minimal atelectasis left lung base. No definite pneumothorax. Right chest wall surgical clips. IMPRESSION: Re- demonstrated moderate to large right pleural effusion with underlying airspace opacification. Electronically Signed   By: Lovey Newcomer M.D.   On: 03/30/2015 15:38   Dg Chest 2 View  04/12/2015  CLINICAL DATA:  Worsening shortness of breath, history of right breast carcinoma with bone metastasis EXAM: CHEST  2 VIEW COMPARISON:  Chest x-ray of 03/30/2015 FINDINGS: There is little change in the volume of the moderate sized right pleural effusion with volume loss at the right lung base. The left lung is clear. A tiny left pleural effusion is difficult to exclude. Mediastinal and hilar contours are unchanged and the heart is within normal  limits in size. A left-sided Port-A-Cath is present with the tip seen to the mid SVC. No acute bony abnormality is seen. IMPRESSION: Little change in moderate size right pleural effusion with volume loss at the right lung base. Cannot exclude a tiny left pleural effusion. Electronically Signed   By: Ivar Drape M.D.   On: 04/12/2015 10:47   Dg Chest 2 View  03/30/2015  CLINICAL DATA:  Increase in shortness of breath and secretions. Cough. Metastatic breast cancer. EXAM: CHEST - 2 VIEW COMPARISON:  PET scan 03/17/2015.  CT chest  03/03/2015. FINDINGS: The heart size is normal. A left IJ Port-A-Cath is noted. The right pleural effusion has significantly increased encompassing over half hemi thorax. The inferior right hemithorax is completely opacified. The left lung is clear. There is no significant left effusion. IMPRESSION: 1. Progressive large right pleural effusion obscuring underlying airspace disease and tumor. 2. Stable Port-A-Cath. These results will be called to the ordering clinician or representative by the Radiologist Assistant, and communication documented in the PACS or zVision Dashboard. Electronically Signed   By: San Morelle M.D.   On: 03/30/2015 12:58   Ct Chest W Contrast  04/19/2015  CLINICAL DATA:  49 year old female with history of right-sided breast cancer diagnosed in 2013 with known bone metastasis. Status post radiation therapy and chemotherapy, now complete. Chemotherapy scheduled to begin again on 04/20/2015. Status post mastectomy. Cough and shortness of breath for the past 3-4 months. EXAM: CT CHEST WITH CONTRAST TECHNIQUE: Multidetector CT imaging of the chest was performed during intravenous contrast administration. CONTRAST:  102mL OMNIPAQUE IOHEXOL 300 MG/ML  SOLN COMPARISON:  PET-CT 03/17/2015.  Chest CT 03/03/2015. FINDINGS: Mediastinum/Lymph Nodes: Heart size is normal. Small amount of pericardial fluid and/or thickening, similar to the prior study and unlikely to be of hemodynamic significance at this time. Worsening multifocal lymphadenopathy throughout the mediastinum, most evident in the anterior mediastinum where multiple borderline enlarged and mildly enlarged lymph nodes are noted measuring up to 1 cm. Subcarinal adenopathy also increased measuring up to 1 cm in short axis. Bulky lymphatic tissue at the right hilum, difficult to discretely measure, but measuring up to 12 mm in thickness. Esophagus is unremarkable in appearance. No axillary lymphadenopathy. No internal mammary  lymphadenopathy. Left-sided internal jugular single-lumen porta cath with tip terminating in the distal superior vena cava. Lungs/Pleura: The previous described right middle lobe lesion is difficult to visualize on today's examination, but appears significantly increased in size, currently measuring approximately 5.9 x 3.8 cm (image 33 of series 2), currently occupying nearly the entire right middle lobe (with remaining portions atelectatic). Marked interval enlargement of a malignant right pleural effusion, with increasing areas of pleural thickening and enhancing pleural nodularity throughout the right hemithorax, most evident at the base of the right hemithorax where the largest pleural-based mass measures 1.8 x 4.3 cm (image 59 of series 2). Extensive passive atelectasis in portions of the right lower lobe and right upper lobe. Throughout the aerated portions of the right upper and right lower lobes there is extensive septal thickening, somewhat nodular in appearance, concerning for worsening lymphangitic spread of tumor. There is also small left-sided pleural effusion which layers dependently and is simple in appearance. This is associated with some passive subsegmental atelectasis in the left lower lobe. Upper Abdomen: Multiple ill-defined hypovascular areas in the liver are new compared to prior studies, and appear concerning for probable widespread metastatic disease to the liver, the largest of which is estimated to measure approximately 1.7 x 1.2  cm in segment 2 of the liver (image 55 of series 2). New right adrenal mass measuring 3.1 x 1.5 cm, presumably a metastatic lesion. Incompletely visualize soft tissue near the hepatic hilum immediately inferior to the portal vein measuring at least 1.5 x 2.8 cm (image 67 of series 2), likely to represent a metastatic hepatoduodenal ligament lymph node. Multiple borderline enlarged and mildly enlarged gastrohepatic lymph nodes measuring up to 1 cm in short axis.  Multiple borderline enlarged celiac axis lymph nodes. Pneumoperitoneum visualized in the upper abdomen. Musculoskeletal/Soft Tissues: Ill-defined areas of sclerosis are noted in the manubrium and superior aspect of the sternum, similar to prior studies (nonspecific). Compression fracture of superior endplate of T9 is similar to the prior study, with approximately 25% loss of central vertebral body height on the left side. IMPRESSION: 1. Pneumoperitoneum. Correlation with CT of the abdomen and pelvis is suggested to identify potential source for this unexpected finding if clinically appropriate. 2. Marked progression of disease as evidenced by enlarging right middle lobe mass, markedly enlarged malignant right pleural effusion, and evidence of lymphangitic spread of disease throughout the right lung, with increasing right hilar and mediastinal lymphadenopathy, as discussed above. In addition, there are likely multifocal new hepatic metastases, a right adrenal metastasis, and multiple upper abdominal lymph nodes, concerning for metastatic disease. 3. New small left pleural effusion with some passive subsegmental atelectasis in the left lower lobe. 4. Additional incidental findings, as above. Critical Value/emergent results were called by telephone at the time of interpretation on 04/19/2015 at 9:52 am to Dr. Lurline Del, who verbally acknowledged these results. Electronically Signed   By: Vinnie Langton M.D.   On: 04/19/2015 09:53   Ct Abdomen Pelvis W Contrast  04/19/2015  CLINICAL DATA:  Patient with history of metastatic breast cancer. On restaging chest CT earlier today there was evidence of pneumoperitoneum. Evaluation of the abdomen and pelvis. EXAM: CT ABDOMEN AND PELVIS WITH CONTRAST TECHNIQUE: Multidetector CT imaging of the abdomen and pelvis was performed using the standard protocol following bolus administration of intravenous contrast. CONTRAST:  47mL OMNIPAQUE IOHEXOL 300 MG/ML SOLN, 1  OMNIPAQUE IOHEXOL 300 MG/ML SOLN COMPARISON:  CT chest 04/19/2015; PET-CT 03/17/2015. FINDINGS: Lower chest: Patient had chest CT same day. See dedicated chest CT dictation of chest findings including right middle lobe mass, malignant right pleural effusion, lymphangitic spread of disease throughout the right lung and increasing adenopathy within the right hilum. Small left pleural effusion. Hepatobiliary: Interval development of innumerable low-attenuation lesions throughout the liver as well as interval increase in size of lesion within the central aspect of the liver measuring 1.4 x 2.1 cm (image 35; series 2), previously 1.3 x 1.1 cm. Reference lesion within the right hepatic lobe measures 1.5 x 1.1 cm (image 44; series 2). Reference lesion within the left hepatic lobe measures 1.0 x 1.0 cm (image 33; series 2). Pancreas: Unremarkable Spleen: Unremarkable Adrenals/Urinary Tract: Interval increase in size of right adrenal metastasis measuring 3.2 cm, previously 1.4 cm (image 38; series 2). Left adrenal gland is unremarkable. Kidneys enhance symmetrically with contrast. No hydronephrosis. Urinary bladder is unremarkable. Stomach/Bowel: Small amount of fluid in the pelvis. Interval development of pneumatosis involving the cecum, ascending and proximal transverse colon. There is a small amount of free intraperitoneal air. Oral contrast material is demonstrated to the level of the descending colon. No evidence for bowel obstruction. Vascular/Lymphatic: Unchanged retrocrural adenopathy measuring up to 1.3 cm (image 33; series 2), previously 1.0 cm. Normal caliber abdominal aorta. Other: Uterus  and adnexal structures are unremarkable. Musculoskeletal: Compression fracture of the superior endplate of the T9 vertebral body, similar prior exam. IMPRESSION: Free intraperitoneal air is demonstrated. There is marked pneumatosis of the cecum, ascending and proximal transverse colon. Interval progression of disease with  innumerable hepatic metastasis and enlarged right adrenal metastasis. Progression of metastatic disease within the chest as described on chest CT earlier same day. Critical Value/emergent results were called by telephone at the time of interpretation on 04/19/2015 at 6:43 pm to Dr. Lurline Del , who verbally acknowledged these results. Electronically Signed   By: Lovey Newcomer M.D.   On: 04/19/2015 18:47   US Thoracentesis Asp Pleural Space W/img Guide  03/30/2015  INDICATION: Metastatic breast cancer, dyspnea, recurrent right pleural effusion. Request is made for therapeutic right thoracentesis. EXAM: ULTRASOUND GUIDED THERAPEUTIC RIGHT THORACENTESIS COMPARISON:  Prior thoracentesis on 10/29/2014 MEDICATIONS: None COMPLICATIONS: None immediate TECHNIQUE: Informed written consent was obtained from the patient after a discussion of the risks, benefits and alternatives to treatment. A timeout was performed prior to the initiation of the procedure. Initial ultrasound scanning demonstrates a moderate right pleural effusion. The lower chest was prepped and draped in the usual sterile fashion. 1% lidocaine was used for local anesthesia. An ultrasound image was saved for documentation purposes. A 6 Fr Safe-T-Centesis catheter was introduced. The thoracentesis was performed. The catheter was removed and a dressing was applied. The patient tolerated the procedure well without immediate post procedural complication. The patient was escorted to have an upright chest radiograph. FINDINGS: A total of approximately 900 cc of bloody fluid was removed. Due to the multi-septated nature of the pleural collection only the above amount of fluid could be removed today. IMPRESSION: Successful ultrasound-guided therapeutic right sided thoracentesis yielding 900 cc of pleural fluid. Read by: Rowe Robert, PA-C Electronically Signed   By: Jerilynn Mages.  Shick M.D.   On: 03/30/2015 15:42    Discharge Laboratory Values: Basic Metabolic  Panel:  Recent Labs Lab 04/19/15 1300 04/20/15 0400 04/21/15 0546  NA 141 139 137  K 3.2* 3.3* 3.5  CL 103 102 101  CO2 30 30 28   GLUCOSE 84 87 119*  BUN 10 6 10   CREATININE 0.49 0.45 0.48  CALCIUM 8.5* 8.2* 8.3*  MG  --  1.7  --    GFR Estimated Creatinine Clearance: 84.3 mL/min (by C-G formula based on Cr of 0.48). Liver Function Tests:  Recent Labs Lab 04/19/15 1300 04/21/15 0546  AST 18 17  ALT 10* 10*  ALKPHOS 105 94  BILITOT 0.7 0.6  PROT 6.3* 6.5  ALBUMIN 2.8* 2.6*    Recent Labs Lab 04/20/15 0400  AMYLASE 29   No results for input(s): AMMONIA in the last 168 hours. Coagulation profile  Recent Labs Lab 04/19/15 1300  INR 1.28    CBC:  Recent Labs Lab 04/19/15 1300 04/20/15 0400 04/21/15 0546  WBC 3.4* 2.9* 4.5  NEUTROABS 2.6 2.3 4.0  HGB 9.7* 9.7* 10.6*  HCT 30.6* 31.0* 33.4*  MCV 102.0* 103.3* 99.4  PLT 265 265 265   Cardiac Enzymes: No results for input(s): CKTOTAL, CKMB, CKMBINDEX, TROPONINI in the last 168 hours. BNP: Invalid input(s): POCBNP CBG: No results for input(s): GLUCAP in the last 168 hours. D-Dimer No results for input(s): DDIMER in the last 72 hours. Hgb A1c No results for input(s): HGBA1C in the last 72 hours. Lipid Profile No results for input(s): CHOL, HDL, LDLCALC, TRIG, CHOLHDL, LDLDIRECT in the last 72 hours. Thyroid function studies  Recent Labs  04/19/15 1300  TSH 3.635   Anemia work up No results for input(s): VITAMINB12, FOLATE, FERRITIN, TIBC, IRON, RETICCTPCT in the last 72 hours. Sepsis Labs Invalid input(s): PROCALCITONIN,  WBC,  LACTICIDVEN Microbiology Recent Results (from the past 240 hour(s))  Urine culture     Status: None   Collection Time: 04/19/15  3:49 PM  Result Value Ref Range Status   Specimen Description URINE, CLEAN CATCH  Final   Special Requests Immunocompromised  Final   Culture   Final    1,000 COLONIES/mL INSIGNIFICANT GROWTH Performed at [facility]    Report  Status 04/20/2015 FINAL  Final     Brief H and P: For complete details please refer to admission H and P, but in brief, the patient was admitted emergently 04/19/2015 when restaging chest CT showed pneumoperitoneum. See Hospital Course for wirkup and treatment  Physical Exam at Discharge:  BP 138/81 mmHg  Pulse 71  Temp(Src) 97.8 F (36.6 C) (Oral)  Resp 18  Ht 5' 7.5" (1.715 m)  Wt 157 lb (71.215 kg)  BMI 24.21 kg/m2  SpO2 93%  LMP 03/29/2012 Gen:.middle aged white woman examined in bed Cardiovascular:RRR, no murmur Respiratory: decreased breath sounds R base Gastrointestinal: no tenderness, no BS, no distention Extremities:R upper extremity lymphedema    Hospital Course:  Active Problems:   Breast cancer of upper-outer quadrant of right female breast ([place])   Breast cancer metastasized to bone [facility])   Breast cancer metastasized to multiple sites [facility])   Uncontrolled pain   Pneumoperitoneum   Abdominal wall anomaly   GERD (gastroesophageal reflux disease)   Brain metastases ([facility])   Malignant pleural effusion   Malnutrition of moderate degree  The patient was admitted for evaluation of newly diagnosed pneumoperitoneum. CT of the abd was obtained showing significant colonic pneumatosis. The patient was kept npo, abx were started (ceftriaxone, metronidazole) and surgical consult was obtained. Workup was remarkable for no fever, no abdominal symptoms, and repeatedly benign abd exam. Labs showed no leukocytosis, adequate neutrophils,no elevation in LDH or amylase. It was felt the patient's pneumoperitoneum was due not to perforation but to the pneumatosis, which was itself not due to typhlitis but likely to alteres pressures emanating from Right lung involvement by cancer.  The patient stands in urgent need of chemoterapy for her rapidly growing metastatic breast cancer. After a full discussion of risks it was decided we could proceed and the patient received her day 1 cycle 1  dose of eribulin 04/20/2015. She tolerated this well, with no N/V or other side effects. On the AM of discharge the patient is stable, tolerating flagyl TID w/o side effects, and feels ready to go home.  She had an O2 sat of 81-83% off O2 this admission, much worse w coughing, and will benefit from having O2 available at home  Diet:  regular  Activity:  As toleratedf  Condition at Discharge:   stable  Signed: Dr. Lurline Del [phone]  04/21/2015, 8:15 AM

## 2015-04-22 ENCOUNTER — Other Ambulatory Visit: Payer: Self-pay

## 2015-04-22 ENCOUNTER — Other Ambulatory Visit: Payer: Self-pay | Admitting: *Deleted

## 2015-04-22 ENCOUNTER — Telehealth: Payer: Self-pay | Admitting: *Deleted

## 2015-04-22 ENCOUNTER — Ambulatory Visit (HOSPITAL_BASED_OUTPATIENT_CLINIC_OR_DEPARTMENT_OTHER): Payer: BLUE CROSS/BLUE SHIELD

## 2015-04-22 ENCOUNTER — Ambulatory Visit: Payer: BLUE CROSS/BLUE SHIELD

## 2015-04-22 DIAGNOSIS — R197 Diarrhea, unspecified: Secondary | ICD-10-CM

## 2015-04-22 DIAGNOSIS — C50411 Malignant neoplasm of upper-outer quadrant of right female breast: Secondary | ICD-10-CM

## 2015-04-22 DIAGNOSIS — C50919 Malignant neoplasm of unspecified site of unspecified female breast: Secondary | ICD-10-CM

## 2015-04-22 DIAGNOSIS — C7951 Secondary malignant neoplasm of bone: Secondary | ICD-10-CM

## 2015-04-22 MED ORDER — CHOLESTYRAMINE 4 G PO PACK: 4.0000 g | PACK | Freq: Three times a day (TID) | ORAL | Status: DC

## 2015-04-22 MED ORDER — SODIUM CHLORIDE 0.9 % IV SOLN
1000.0000 mL | Freq: Once | INTRAVENOUS | Status: AC
  Administered 2015-04-22: 1000 mL via INTRAVENOUS

## 2015-04-22 NOTE — Telephone Encounter (Signed)
"  Would you let Dr. [name] know I've had liquid diarrhea six times today.  I'm wearing pads because twice I didn't make it to the bathroom in time.  Through the night, no problem.  I'm taking Florastor probiotic.  I've not tried imodium because of my stomach problems.  Was on liquid diet but have eaten banana, bagel, cream last night and today eggs, toast.  My ankles are swollen.  Elevating legs.  Both equal in size.  It's just ankles, feet look fine.  I've never had a problem with swelling. "

## 2015-04-22 NOTE — Patient Instructions (Signed)

## 2015-04-23 ENCOUNTER — Ambulatory Visit: Payer: BLUE CROSS/BLUE SHIELD

## 2015-04-23 ENCOUNTER — Other Ambulatory Visit (HOSPITAL_COMMUNITY)
Admission: RE | Admit: 2015-04-23 | Discharge: 2015-04-23 | Disposition: A | Discharge status: Home / Self Care | Payer: BLUE CROSS/BLUE SHIELD | Source: Ambulatory Visit | Attending: Oncology | Admitting: Oncology

## 2015-04-23 ENCOUNTER — Telehealth: Payer: Self-pay

## 2015-04-23 DIAGNOSIS — R197 Diarrhea, unspecified: Secondary | ICD-10-CM | POA: Insufficient documentation

## 2015-04-23 LAB — C DIFFICILE QUICK SCREEN W PCR REFLEX
C DIFFICILE (CDIFF) INTERP: NEGATIVE
C DIFFICLE (CDIFF) ANTIGEN: NEGATIVE
C Diff toxin: NEGATIVE

## 2015-04-23 NOTE — Telephone Encounter (Signed)
Pt calling about Dr magrinat's opinion of her feet swelling and the stool sample she brought in. Ankles swollen starting yesterday, not swollen in hospital at all. Has them elevated " a whole lot". Bilaterally ankles and going up R leg half way up the calf. No redness, no tenderness, no heat feeling. Pt has lymphedema in arm. Pt is on O2. Pt had fluid yesterday but did not receive any answers about the ankle swelling.  Does CT of 11/28 show any reason? Can Dr [name] give reason for this? What should she do?

## 2015-04-23 NOTE — Telephone Encounter (Signed)
Writer spoke with patient who denies fever, redness or pain in either of her legs.  She states that the swelling is much the same as yesterday and she continues to keep them elevated most of the day.  Writer encouraged her to go to the ED if the swelling increases, becomes painful/red or if she starts to run a fever.  Patient states understanding and will update on Monday at which time if symptoms continue she will come in with labs and see Selena Lesser, NP Patient's c-diff is negative and she states that she is no longer having diarrhea.  She states that she had two small BM's today.

## 2015-04-25 ENCOUNTER — Encounter: Payer: Self-pay | Admitting: Oncology

## 2015-04-26 ENCOUNTER — Other Ambulatory Visit: Payer: Self-pay | Admitting: Oncology

## 2015-04-26 ENCOUNTER — Telehealth: Payer: Self-pay | Admitting: Oncology

## 2015-04-26 ENCOUNTER — Ambulatory Visit: Payer: BLUE CROSS/BLUE SHIELD

## 2015-04-26 NOTE — Telephone Encounter (Signed)
12/6 chemo cancelled per pof

## 2015-04-27 ENCOUNTER — Ambulatory Visit: Payer: BLUE CROSS/BLUE SHIELD

## 2015-04-27 ENCOUNTER — Other Ambulatory Visit: Payer: Self-pay | Admitting: *Deleted

## 2015-04-27 NOTE — Telephone Encounter (Signed)
Observed a 04-22-2015 order for stool for C-diff

## 2015-04-28 ENCOUNTER — Ambulatory Visit (HOSPITAL_COMMUNITY)
Admission: RE | Admit: 2015-04-28 | Discharge: 2015-04-28 | Disposition: A | Discharge status: Home / Self Care | Payer: BLUE CROSS/BLUE SHIELD | Source: Ambulatory Visit | Attending: Oncology | Admitting: Oncology

## 2015-04-28 ENCOUNTER — Other Ambulatory Visit: Payer: Self-pay | Admitting: *Deleted

## 2015-04-28 ENCOUNTER — Other Ambulatory Visit (HOSPITAL_BASED_OUTPATIENT_CLINIC_OR_DEPARTMENT_OTHER): Payer: BLUE CROSS/BLUE SHIELD

## 2015-04-28 ENCOUNTER — Telehealth: Payer: Self-pay | Admitting: Oncology

## 2015-04-28 ENCOUNTER — Other Ambulatory Visit: Payer: Self-pay | Admitting: Oncology

## 2015-04-28 ENCOUNTER — Ambulatory Visit: Payer: BLUE CROSS/BLUE SHIELD

## 2015-04-28 ENCOUNTER — Ambulatory Visit (HOSPITAL_BASED_OUTPATIENT_CLINIC_OR_DEPARTMENT_OTHER): Payer: BLUE CROSS/BLUE SHIELD | Admitting: Oncology

## 2015-04-28 ENCOUNTER — Ambulatory Visit (HOSPITAL_BASED_OUTPATIENT_CLINIC_OR_DEPARTMENT_OTHER): Payer: BLUE CROSS/BLUE SHIELD

## 2015-04-28 VITALS — BP 114/86 | HR 97 | Temp 98.3°F | Resp 20 | Ht 67.5 in | Wt 168.3 lb

## 2015-04-28 DIAGNOSIS — C7951 Secondary malignant neoplasm of bone: Secondary | ICD-10-CM

## 2015-04-28 DIAGNOSIS — C50911 Malignant neoplasm of unspecified site of right female breast: Secondary | ICD-10-CM

## 2015-04-28 DIAGNOSIS — J910 Malignant pleural effusion: Secondary | ICD-10-CM

## 2015-04-28 DIAGNOSIS — D701 Agranulocytosis secondary to cancer chemotherapy: Secondary | ICD-10-CM | POA: Diagnosis not present

## 2015-04-28 DIAGNOSIS — R0602 Shortness of breath: Secondary | ICD-10-CM

## 2015-04-28 DIAGNOSIS — D702 Other drug-induced agranulocytosis: Secondary | ICD-10-CM | POA: Diagnosis not present

## 2015-04-28 DIAGNOSIS — M79605 Pain in left leg: Secondary | ICD-10-CM

## 2015-04-28 DIAGNOSIS — M79604 Pain in right leg: Secondary | ICD-10-CM

## 2015-04-28 DIAGNOSIS — C50912 Malignant neoplasm of unspecified site of left female breast: Secondary | ICD-10-CM

## 2015-04-28 DIAGNOSIS — C50411 Malignant neoplasm of upper-outer quadrant of right female breast: Secondary | ICD-10-CM

## 2015-04-28 DIAGNOSIS — R2243 Localized swelling, mass and lump, lower limb, bilateral: Secondary | ICD-10-CM

## 2015-04-28 DIAGNOSIS — C50919 Malignant neoplasm of unspecified site of unspecified female breast: Secondary | ICD-10-CM

## 2015-04-28 DIAGNOSIS — M7989 Other specified soft tissue disorders: Secondary | ICD-10-CM | POA: Insufficient documentation

## 2015-04-28 LAB — CBC WITH DIFFERENTIAL/PLATELET
BASO%: 3.7 % — AB (ref 0.0–2.0)
Basophils Absolute: 0 10*3/uL (ref 0.0–0.1)
EOS%: 3.7 % (ref 0.0–7.0)
Eosinophils Absolute: 0 10*3/uL (ref 0.0–0.5)
HCT: 33.5 % — ABNORMAL LOW (ref 34.8–46.6)
HGB: 10.6 g/dL — ABNORMAL LOW (ref 11.6–15.9)
LYMPH%: 24.4 % (ref 14.0–49.7)
MCH: 31.5 pg (ref 25.1–34.0)
MCHC: 31.6 g/dL (ref 31.5–36.0)
MCV: 99.4 fL (ref 79.5–101.0)
MONO#: 0.2 10*3/uL (ref 0.1–0.9)
MONO%: 18.3 % — AB (ref 0.0–14.0)
NEUT%: 49.9 % (ref 38.4–76.8)
NEUTROS ABS: 0.4 10*3/uL — AB (ref 1.5–6.5)
Platelets: 229 10*3/uL (ref 145–400)
RBC: 3.37 10*6/uL — AB (ref 3.70–5.45)
RDW: 16.4 % — ABNORMAL HIGH (ref 11.2–14.5)
WBC: 0.8 10*3/uL — AB (ref 3.9–10.3)
lymph#: 0.2 10*3/uL — ABNORMAL LOW (ref 0.9–3.3)

## 2015-04-28 LAB — COMPREHENSIVE METABOLIC PANEL
AST: 17 U/L (ref 5–34)
Albumin: 2.8 g/dL — ABNORMAL LOW (ref 3.5–5.0)
Alkaline Phosphatase: 79 U/L (ref 40–150)
Anion Gap: 11 mEq/L (ref 3–11)
BILIRUBIN TOTAL: 0.53 mg/dL (ref 0.20–1.20)
BUN: 7 mg/dL (ref 7.0–26.0)
CO2: 31 meq/L — AB (ref 22–29)
Calcium: 9 mg/dL (ref 8.4–10.4)
Chloride: 99 mEq/L (ref 98–109)
Creatinine: 0.6 mg/dL (ref 0.6–1.1)
GLUCOSE: 93 mg/dL (ref 70–140)
Potassium: 3.6 mEq/L (ref 3.5–5.1)
SODIUM: 140 meq/L (ref 136–145)
TOTAL PROTEIN: 6.1 g/dL — AB (ref 6.4–8.3)

## 2015-04-28 MED ORDER — LORAZEPAM 0.5 MG PO TABS: 0.5000 mg | ORAL_TABLET | Freq: Every evening | ORAL | Status: DC | PRN

## 2015-04-28 MED ORDER — CIPROFLOXACIN HCL 500 MG PO TABS: 500.0000 mg | ORAL_TABLET | Freq: Two times a day (BID) | ORAL | Status: DC

## 2015-04-28 MED ORDER — FILGRASTIM 300 MCG/0.5ML IJ SOSY: 300.0000 ug | PREFILLED_SYRINGE | Freq: Once | INTRAMUSCULAR | Status: DC

## 2015-04-28 MED ORDER — TBO-FILGRASTIM 300 MCG/0.5ML ~~LOC~~ SOSY
300.0000 ug | PREFILLED_SYRINGE | Freq: Once | SUBCUTANEOUS | Status: AC
Start: 2015-04-28 — End: 2015-04-28
  Administered 2015-04-28: 300 ug via SUBCUTANEOUS
  Filled 2015-04-28: qty 0.5

## 2015-04-28 NOTE — Progress Notes (Signed)
VASCULAR LAB PRELIMINARY  PRELIMINARY  PRELIMINARY  PRELIMINARY  Bilateral lower extremity venous duplex  completed.    Preliminary report:  Bilateral:  No evidence of DVT, superficial thrombosis, or Baker's Cyst.  Interstitial fluid noted in the lateral distal thigh (area of interest) bilaterally.     Niel Peretti, RVT 04/28/2015, 1:41 PM

## 2015-04-28 NOTE — Progress Notes (Signed)
And a Patient ID: Nicole Valentine, female   DOB: 16-May-1966, 49 y.o.   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 She had a mammogram performed that showed dense breasts bilaterally. Ultrasound of the right breast showed 2.3 x 1.9 cm area of abnormality with multiple abnormal lymph nodes. MRI of the bilateral breasts showed asymmetrical enhancement throughout the right breast consistent with multicentric disease. An ultrasound-guided biopsy performed. The known area of disease measured 1.9 x 1.9 x 2.3 cm. Multiple abnormal positive right axillary lymph nodes were noted. 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 Biopsy of the right axillary lymph node was positive for malignancy with extracapsular extension. Patient was originally seen by Dr. Margot Valentine Dr. Truddie Valentine and Dr. Pablo Valentine. She has elected to have a right mastectomy eventually and declined biopsies of any other areas within the breast."  She went on to receive neoadjuvant chemotherapy and attained a complete pathologic response, as documented below  METASTATIC DISEASE: From the prior summary note: Nicole Valentine noted a very small right supraclavicular mass 09/25/2014, which she brought to our attention. She was having some allergy symptoms at the time so we decided to reevaluate this after a few weeks and on 10/22/2014 as the mass persisted we obtained a restaging neck CT scan, 10/28/2014. There was bilateral supraclavicular adenopathy, but also a large right pleural  effusion was incidentally noted. We proceeded to right thoracentesis 10/29/2014, and a liter of hazy yellow fluid was removed. Cytology from this procedure ([id number]) showed malignant cells consistent with adenocarcinoma, estrogen and progesterone receptor negative, HER-2 not amplified, with a signals ratio 1.12 and the number per cell being 2.75, and with an MIB-1 of 80%. I called Nicole Valentine with these results and set her up for a PET scan performed 11/06/2014, which shows widespread metastatic disease involving particularly the right lung, liver, and bones, but also the left lung, right adrenal, and multiple lymph node areas.  Her subsequent history is as detailed below.  INTERVAL HISTORY: Nicole Valentine returns today for follow-up of her metastatic triple negative breast cancer. Today is day 8 cycle 1 of irregularity in, to be given days 1 and 8 of each 21 day cycle. She tolerated the day 1 treatment well, without significant nausea or vomiting problems. Her port worked well.  REVIEW OF S/YSTEMS: Nicole Valentine very tired. She is not sleeping well. She gets to bed around 10 or 11 PM, wakes up and about 2:30 in the morning, sits up a little bit and then goes back to bed and sleeps a few more hours. His situation at home currently is a little bit chaotic. A couple of her kids have colds. The kitchen is being redone. Meanwhile her parents have come in and are sleeping in the dining room, which has been converted to her bedroom. His current be at least 3 weeks before things become a little bit more normal.  She's gained a few pounds, likely secondary to fluid. She hurts in her lower back and the size of her thighs primarily. She uses Aleve 2 tablets twice daily for  this. There is also some soreness and aching in the right shoulder and right rib cage areas. Her diarrhea has resolved. She does have lower extremity swelling, right greater than left.. Her appetite is poor. She is very short of breath with any activity.  She sleeps in a recliner. Her urine is dark but not Coca-Cola colored. She uses oxygen 24 7 at home. She has had no intercurrent fevers. They have been no unusual headaches, visual changes, dizziness, or gait imbalance. A detailed review of systems was otherwise stable.  PAST MEDICAL HISTORY: Past Medical History  Diagnosis Date  . Complication of anesthesia     her father and uncle both had very difficult time waking up-was  something they told them may be hereditory. she will find out.  [name] Hypothyroidism   . 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 Radiation 01/21/13-03/06/13    Right Breast  . Breast cancer ([place]) dx'd 04-10-12-rt    PAST SURGICAL HISTORY: Past Surgical History  Procedure Laterality Date  . Wisdom tooth extraction    . Portacath placement  04/22/2012    Procedure: INSERTION PORT-A-CATH;  Surgeon: [name], MD;  Location: [facility];  Service: General;  Laterality: Left;  [name] Modified mastectomy Right 11/18/2012    Procedure:  RIGHT MODIFIED MASTECTOMY;  Surgeon: [name], MD;  Location: [facility];  Service: General;  Laterality: Right;  . Knee arthroscopy Right 11/26/2013  . Port-a-cath removal Left 12/10/2013    Procedure: MINOR REMOVAL PORT-A-CATH;  Surgeon: [name], MD;  Location: [facility];  Service: General;  Laterality: Left;    FAMILY HISTORY Family History  Problem Relation Age of Onset  . Lung cancer Maternal Grandfather 14    smoker  . Prostate cancer Maternal Grandfather 90  . Throat cancer Other     Great Aunt x 2  . Liver cancer Other     Maternal Great Grandmother  . Melanoma Maternal Uncle 81  . Brain cancer Cousin 11    non-malignant  . Allergic rhinitis Neg Hx   . Asthma Neg Hx   . Eczema Neg Hx   . Urticaria Neg Hx   the patient's parents are living, in their early 21s. The patient has 2 brothers, no sisters. The patient's mother was diagnosed with breast cancer, HER-2  positive, in 2014 in [place].  GYNECOLOGIC HISTORY:   (Updated 10/03/2013) Menarche age 57, first live birth age 8. She is GX P4. LMP January 2014. 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  The children are currently ages 16, 51, 67, and 30. Her husband, [name], is a Customer service manager.He just started a job for [employer].  They attend the [employer] DIRECTIVES: The patient's husband is her HCPOA   HEALTH MAINTENANCE:  (Updated 10/03/2013) Social History  Substance Use Topics  . Smoking status: Never Smoker   . Smokeless tobacco: Never Used  . Alcohol Use: No     Colonoscopy: Never  PAP: November 2013/Dr. Smith  Bone density: January 2015, [facility], normal  Lipid panel:  Not on file  Allergies  Allergen Reactions  . Almond Meal Rash  . Almond Oil Rash  . Other Rash    LOTIONS-unknown type  . Tegaderm Ag Mesh [Silver] Rash  . Vancomycin Rash    Red Man Syndrome  Current Outpatient Prescriptions  Medication Sig Dispense Refill  . cholecalciferol (VITAMIN D) 1000 UNITS tablet Take 2,000 Units by mouth 2 (two) times daily.     . cholestyramine (QUESTRAN) 4 G packet Take 1 packet (4 g total) by mouth 3 (three) times daily with meals. 60 each 12  . dextromethorphan-guaiFENesin (MUCINEX DM) 30-600 MG 12hr tablet Take 1 tablet by mouth 2 (two) times daily as needed for cough.    . levothyroxine (SYNTHROID, LEVOTHROID) 175 MCG tablet Take 175 mcg by mouth daily before breakfast.    . lidocaine-prilocaine (EMLA) cream Apply to affected area once 30 g 3  . loratadine-pseudoephedrine (CLARITIN-D 12-HOUR) 5-120 MG tablet Take 1 tablet by mouth 2 (two) times daily.    [name] LORazepam (ATIVAN) 0.5 MG tablet Take 1 tablet (0.5 mg total) by mouth every 6 (six) hours as needed (Nausea or vomiting). 30 tablet 0  . metroNIDAZOLE (FLAGYL) 500 MG tablet Take 1 tablet (500 mg  total) by mouth every 8 (eight) hours. 90 tablet 2  . naproxen sodium (ALEVE) 220 MG tablet Take 2 tablets (440 mg total) by mouth 2 (two) times daily with a meal.    . pantoprazole (PROTONIX) 40 MG tablet Take 1 tablet (40 mg total) by mouth daily at 12 noon. 30 tablet 2  . promethazine-codeine (PHENERGAN WITH CODEINE) 6.25-10 MG/5ML syrup Take 5 mLs by mouth every 6 (six) hours as needed for cough. 120 mL 0   No current facility-administered medications for this visit.      OBJECTIVE: Middle-aged white woman who appears chronically ill  Filed Vitals:   04/28/15 1119  BP: 114/86  Pulse: 97  Temp: 98.3 F (36.8 C)  Resp: 20     Body mass index is 25.96 kg/(m^2).    ECOG FS:2 - Symptomatic, <50% confined to bed Filed Weights   04/28/15 1119  Weight: 168 lb 4.8 oz (76.34 kg)    Sclerae unicteric, EOMs intact Oropharynx clear, dentition in good repair No cervical or supraclavicular adenopathy Lungs no wheezes, dullness to percussion at both bases Heart regular rate and rhythm Abd soft, nontender, positive bowel sounds MSK no focal spinal tenderness, bilateral lower extremity lymphedema as previously noted Neuro: nonfocal, well oriented, appropriate affect Breasts: deferred    LAB RESULTS:   Lab Results  Component Value Date   WBC 0.8* 04/28/2015   NEUTROABS 0.4* 04/28/2015   HGB 10.6* 04/28/2015   HCT 33.5* 04/28/2015   MCV 99.4 04/28/2015   PLT 229 04/28/2015      Chemistry      Component Value Date/Time   NA 140 04/28/2015 1050   NA 137 04/21/2015 0546   K 3.6 04/28/2015 1050   K 3.5 04/21/2015 0546   CL 101 04/21/2015 0546   CL 101 10/18/2012 0859   CO2 31* 04/28/2015 1050   CO2 28 04/21/2015 0546   BUN 7.0 04/28/2015 1050   BUN 10 04/21/2015 0546   CREATININE 0.6 04/28/2015 1050   CREATININE 0.48 04/21/2015 0546      Component Value Date/Time   CALCIUM 9.0 04/28/2015 1050   CALCIUM 8.3* 04/21/2015 0546   ALKPHOS 79 04/28/2015 1050   ALKPHOS 94  04/21/2015 0546   AST 17 04/28/2015 1050   AST 17 04/21/2015 0546   ALT <9 04/28/2015 1050   ALT 10* 04/21/2015 0546   BILITOT 0.53 04/28/2015 1050   BILITOT 0.6 04/21/2015 0546       STUDIES: Dg Chest 1 View  03/30/2015  CLINICAL DATA:  Patient status post right thoracentesis. History shortness of breath. EXAM: CHEST 1 VIEW COMPARISON:  Chest radiograph 03/30/2015 FINDINGS: Left anterior chest wall Port-A-Cath is present with tip projecting over the superior vena cava. Stable cardiac and mediastinal contours. Unchanged moderate to large right pleural effusion with underlying pulmonary consolidation. Possible tiny left pleural effusion. Minimal atelectasis left lung base. No definite pneumothorax. Right chest wall surgical clips. IMPRESSION: Re- demonstrated moderate to large right pleural effusion with underlying airspace opacification. Electronically Signed   By: Lovey Newcomer M.D.   On: 03/30/2015 15:38   Dg Chest 2 View  04/12/2015  CLINICAL DATA:  Worsening shortness of breath, history of right breast carcinoma with bone metastasis EXAM: CHEST  2 VIEW COMPARISON:  Chest x-ray of 03/30/2015 FINDINGS: There is little change in the volume of the moderate sized right pleural effusion with volume loss at the right lung base. The left lung is clear. A tiny left pleural effusion is difficult to exclude. Mediastinal and hilar contours are unchanged and the heart is within normal limits in size. A left-sided Port-A-Cath is present with the tip seen to the mid SVC. No acute bony abnormality is seen. IMPRESSION: Little change in moderate size right pleural effusion with volume loss at the right lung base. Cannot exclude a tiny left pleural effusion. Electronically Signed   By: Ivar Drape M.D.   On: 04/12/2015 10:47   Dg Chest 2 View  03/30/2015  CLINICAL DATA:  Increase in shortness of breath and secretions. Cough. Metastatic breast cancer. EXAM: CHEST - 2 VIEW COMPARISON:  PET scan 03/17/2015.  CT  chest 03/03/2015. FINDINGS: The heart size is normal. A left IJ Port-A-Cath is noted. The right pleural effusion has significantly increased encompassing over half hemi thorax. The inferior right hemithorax is completely opacified. The left lung is clear. There is no significant left effusion. IMPRESSION: 1. Progressive large right pleural effusion obscuring underlying airspace disease and tumor. 2. Stable Port-A-Cath. These results will be called to the ordering clinician or representative by the Radiologist Assistant, and communication documented in the PACS or zVision Dashboard. Electronically Signed   By: San Morelle M.D.   On: 03/30/2015 12:58   Dg Abd 1 View  04/21/2015  CLINICAL DATA:  Pneumoperitoneum. EXAM: ABDOMEN - 1 VIEW COMPARISON:  CT scan of April 19, 2015. FINDINGS: No abnormal bowel dilatation is noted. Bilateral pleural effusions are noted, with right much larger than left. No definite pneumoperitoneum is seen on this study. Probable pneumatosis of the cecum is noted as described on prior CT scan. No abnormal calcifications are noted IMPRESSION: No evidence of bowel obstruction or ileus. Bilateral pleural effusions are noted, with right much larger than left. Probable pneumatosis of the cecum is noted as described on prior CT scan. No definite free air is noted on this exam. Electronically Signed   By: Marijo Conception, M.D.   On: 04/21/2015 09:15   Ct Chest W Contrast  04/19/2015  CLINICAL DATA:  49 year old female with history of right-sided breast cancer diagnosed in 2013 with known bone metastasis. Status post radiation therapy and chemotherapy, now complete. Chemotherapy scheduled to begin again on 04/20/2015. Status post mastectomy. Cough and shortness of breath for the past 3-4 months. EXAM: CT CHEST WITH CONTRAST TECHNIQUE: Multidetector CT imaging of the chest was performed during intravenous contrast administration. CONTRAST:  21m OMNIPAQUE IOHEXOL 300 MG/ML  SOLN  COMPARISON:  PET-CT 03/17/2015.  Chest CT 03/03/2015. FINDINGS: Mediastinum/Lymph Nodes: Heart size is normal. Small amount of  pericardial fluid and/or thickening, similar to the prior study and unlikely to be of hemodynamic significance at this time. Worsening multifocal lymphadenopathy throughout the mediastinum, most evident in the anterior mediastinum where multiple borderline enlarged and mildly enlarged lymph nodes are noted measuring up to 1 cm. Subcarinal adenopathy also increased measuring up to 1 cm in short axis. Bulky lymphatic tissue at the right hilum, difficult to discretely measure, but measuring up to 12 mm in thickness. Esophagus is unremarkable in appearance. No axillary lymphadenopathy. No internal mammary lymphadenopathy. Left-sided internal jugular single-lumen porta cath with tip terminating in the distal superior vena cava. Lungs/Pleura: The previous described right middle lobe lesion is difficult to visualize on today's examination, but appears significantly increased in size, currently measuring approximately 5.9 x 3.8 cm (image 33 of series 2), currently occupying nearly the entire right middle lobe (with remaining portions atelectatic). Marked interval enlargement of a malignant right pleural effusion, with increasing areas of pleural thickening and enhancing pleural nodularity throughout the right hemithorax, most evident at the base of the right hemithorax where the largest pleural-based mass measures 1.8 x 4.3 cm (image 59 of series 2). Extensive passive atelectasis in portions of the right lower lobe and right upper lobe. Throughout the aerated portions of the right upper and right lower lobes there is extensive septal thickening, somewhat nodular in appearance, concerning for worsening lymphangitic spread of tumor. There is also small left-sided pleural effusion which layers dependently and is simple in appearance. This is associated with some passive subsegmental atelectasis in the  left lower lobe. Upper Abdomen: Multiple ill-defined hypovascular areas in the liver are new compared to prior studies, and appear concerning for probable widespread metastatic disease to the liver, the largest of which is estimated to measure approximately 1.7 x 1.2 cm in segment 2 of the liver (image 55 of series 2). New right adrenal mass measuring 3.1 x 1.5 cm, presumably a metastatic lesion. Incompletely visualize soft tissue near the hepatic hilum immediately inferior to the portal vein measuring at least 1.5 x 2.8 cm (image 67 of series 2), likely to represent a metastatic hepatoduodenal ligament lymph node. Multiple borderline enlarged and mildly enlarged gastrohepatic lymph nodes measuring up to 1 cm in short axis. Multiple borderline enlarged celiac axis lymph nodes. Pneumoperitoneum visualized in the upper abdomen. Musculoskeletal/Soft Tissues: Ill-defined areas of sclerosis are noted in the manubrium and superior aspect of the sternum, similar to prior studies (nonspecific). Compression fracture of superior endplate of T9 is similar to the prior study, with approximately 25% loss of central vertebral body height on the left side. IMPRESSION: 1. Pneumoperitoneum. Correlation with CT of the abdomen and pelvis is suggested to identify potential source for this unexpected finding if clinically appropriate. 2. Marked progression of disease as evidenced by enlarging right middle lobe mass, markedly enlarged malignant right pleural effusion, and evidence of lymphangitic spread of disease throughout the right lung, with increasing right hilar and mediastinal lymphadenopathy, as discussed above. In addition, there are likely multifocal new hepatic metastases, a right adrenal metastasis, and multiple upper abdominal lymph nodes, concerning for metastatic disease. 3. New small left pleural effusion with some passive subsegmental atelectasis in the left lower lobe. 4. Additional incidental findings, as above.  Critical Value/emergent results were called by telephone at the time of interpretation on 04/19/2015 at 9:52 am to Dr. Lurline Del, who verbally acknowledged these results. Electronically Signed   By: Vinnie Langton M.D.   On: 04/19/2015 09:53   Ct Abdomen Pelvis W  Contrast  04/19/2015  CLINICAL DATA:  Patient with history of metastatic breast cancer. On restaging chest CT earlier today there was evidence of pneumoperitoneum. Evaluation of the abdomen and pelvis. EXAM: CT ABDOMEN AND PELVIS WITH CONTRAST TECHNIQUE: Multidetector CT imaging of the abdomen and pelvis was performed using the standard protocol following bolus administration of intravenous contrast. CONTRAST:  69m OMNIPAQUE IOHEXOL 300 MG/ML SOLN, 1 OMNIPAQUE IOHEXOL 300 MG/ML SOLN COMPARISON:  CT chest 04/19/2015; PET-CT 03/17/2015. FINDINGS: Lower chest: Patient had chest CT same day. See dedicated chest CT dictation of chest findings including right middle lobe mass, malignant right pleural effusion, lymphangitic spread of disease throughout the right lung and increasing adenopathy within the right hilum. Small left pleural effusion. Hepatobiliary: Interval development of innumerable low-attenuation lesions throughout the liver as well as interval increase in size of lesion within the central aspect of the liver measuring 1.4 x 2.1 cm (image 35; series 2), previously 1.3 x 1.1 cm. Reference lesion within the right hepatic lobe measures 1.5 x 1.1 cm (image 44; series 2). Reference lesion within the left hepatic lobe measures 1.0 x 1.0 cm (image 33; series 2). Pancreas: Unremarkable Spleen: Unremarkable Adrenals/Urinary Tract: Interval increase in size of right adrenal metastasis measuring 3.2 cm, previously 1.4 cm (image 38; series 2). Left adrenal gland is unremarkable. Kidneys enhance symmetrically with contrast. No hydronephrosis. Urinary bladder is unremarkable. Stomach/Bowel: Small amount of fluid in the pelvis. Interval development of  pneumatosis involving the cecum, ascending and proximal transverse colon. There is a small amount of free intraperitoneal air. Oral contrast material is demonstrated to the level of the descending colon. No evidence for bowel obstruction. Vascular/Lymphatic: Unchanged retrocrural adenopathy measuring up to 1.3 cm (image 33; series 2), previously 1.0 cm. Normal caliber abdominal aorta. Other: Uterus and adnexal structures are unremarkable. Musculoskeletal: Compression fracture of the superior endplate of the T9 vertebral body, similar prior exam. IMPRESSION: Free intraperitoneal air is demonstrated. There is marked pneumatosis of the cecum, ascending and proximal transverse colon. Interval progression of disease with innumerable hepatic metastasis and enlarged right adrenal metastasis. Progression of metastatic disease within the chest as described on chest CT earlier same day. Critical Value/emergent results were called by telephone at the time of interpretation on 04/19/2015 at 6:43 pm to Dr. GLurline Del, who verbally acknowledged these results. Electronically Signed   By: DLovey NewcomerM.D.   On: 04/19/2015 18:47   UKoreaThoracentesis Asp Pleural Space W/img Guide  03/30/2015  INDICATION: Metastatic breast cancer, dyspnea, recurrent right pleural effusion. Request is made for therapeutic right thoracentesis. EXAM: ULTRASOUND GUIDED THERAPEUTIC RIGHT THORACENTESIS COMPARISON:  Prior thoracentesis on 10/29/2014 MEDICATIONS: None COMPLICATIONS: None immediate TECHNIQUE: Informed written consent was obtained from the patient after a discussion of the risks, benefits and alternatives to treatment. A timeout was performed prior to the initiation of the procedure. Initial ultrasound scanning demonstrates a moderate right pleural effusion. The lower chest was prepped and draped in the usual sterile fashion. 1% lidocaine was used for local anesthesia. An ultrasound image was saved for documentation purposes. A 6 Fr  Safe-T-Centesis catheter was introduced. The thoracentesis was performed. The catheter was removed and a dressing was applied. The patient tolerated the procedure well without immediate post procedural complication. The patient was escorted to have an upright chest radiograph. FINDINGS: A total of approximately 900 cc of bloody fluid was removed. Due to the multi-septated nature of the pleural collection only the above amount of fluid could be removed today. IMPRESSION: Successful  ultrasound-guided therapeutic right sided thoracentesis yielding 900 cc of pleural fluid. Read by: Rowe Robert, PA-C Electronically Signed   By: Jerilynn Mages.  Shick M.D.   On: 03/30/2015 15:42    ASSESSMENT: 49 y.o. BRCA negative [place] woman with triple-negative stage IV breast cancer  (1) an status post right breast upper outer quadrant and right axillary lymph node biopsy 04/04/2012, both positive for an invasive ductal carcinoma, grade 3, triple negative, with an MIB-1 of 25%  (2) Treated neoadjuvantly with  (a) fluorouracil, cyclophosphamide, and epirubicin (at 100 mg/M2) x4 completed 06/07/2012  (b) docetaxel (75 mg/M2) for one dose, 06/21/2012, poorly tolerated  (c) carboplatin and gemcitabine given every 21 days for 6 cycles completed 10/11/2012  (3) status post right modified radical mastectomy 11/18/2012 showing a complete pathologic response--all 16 lymph nodes were benign  (a) no plans for reconstructon  (4) adjuvant radiation therapy completed 03/06/2013  METASTATIC DISEASE June 2016 (5) 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, 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  (6) staging PET scan 11/06/2014 showed significant disease in the middle and lower lobes of the Right lung, Right effusion, multiple liver and bone lesions, as well as left lung nodules, a Right adrenal nodule, and widespread hypermetabolic adenopathy  (a) brain MRI 11/16/2014 showed multiple cerebellar lesions  (7) RADIATION  TREATMENTS thoracic metastases  (a) 01/28/2015-02/11/2015: Thoracic Spine T8-10, 30 Gy in 10 fractions  (7) abraxane day 1 and day 8 of each 21 day cycle started 11/09/2014: last dose 03/12/2015 (6 cycles)  (a) PET scan 01/05/2015 shows an excellent initial response after 3 cycles  (b) PET scan 03/17/2015 shows progression  (8) zolendronate started 11/16/2014, to be repeated every 12 weeks, most recent dose 02/09/2015  (9) Foundation 1 study sent  11/06/2014: patient's sister in law Garvin Fila following [phone])  (a) 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[place], NTRK1, MYC, ARID1A and LYN, none w approved therapies  (b) pazopanib, ponatinib or crizotinib suggested as possible off-protocol options  (10) RADIATION TREATMENTS: Brain metastases:  (a) s/p SRS therapy 12/11/2014 to 4 right cerebellar lesions  (b) multiple (18+) lesions noted on repeat brain MRI 03/16/2015  (c) whole brain irradiation completed 04/12/2015  (11) uncontrolled pain: Started on OxyContin 10 mg twice a day with oxycodone 5 mg as needed 03/26/2015  (12) Right pleural effusion  (a) s/p thoracentesis July 2016 (x2), 03/30/2015  (13) started eribulin11/29/2016, to be given day one and day 8 of each 21 day cycle.  PLAN:  Asante's situation is complicated and it is best to take the problems one at a time.   we have started a new course of chemotherapy with eribulin. We hope to be able to give a day 1 and 8 of each 21 day cycle but very likely we will end up giving it every 2 weeks because she will need significant white cell support. We would like to treat her next week so we are not going with Neulasta at present. Instead she will receive Neupogen for the next several days until her counts recover. We hope to be able to resume treatment December 14 and then every 2 weeks thereafter with Neulasta support   Right now of course she is neutropenic she is not febrile but she has pneumatosis and it is sick at risk for typhlitis.   Accordingly we are continuing the metronidazole but adding ciprofloxacin. She will take this for the next week. We will consider stopping the metronidazole when she returns to see me December 14.    she has  new swelling in both lower extremities, right lower than left. This is likely due to bilateral  pelvicl adenopathy  , but we are obtaining Doppler ultrasounds today to make sure we are not dealing with clots as well. If she does we will start her on Lovenox today.    her breathing continues to deteriorate. Of course this could be due to lymphangitic spread from her tumor or for that matter pneumonitis from the treatments. It could also be simply due to a reaccumulated pleural effusion. I would not like to proceed to thoracentesis at this point when she has no white cells but we will check a chest x-ray on Friday and assuming her counts are better we can consider thoracentesis at that time.    fortunately her pain is moderately well-controlled at present. I encouraged her to take her Prilosec every day so that she does not develop significant gastritis from the naproxen. I also refilled her lorazepam in hopes that this will help him sleep better through the night.  She knows to call for any problems that may develop before the next visit here. Chauncey Cruel, MD   04/28/2015

## 2015-04-28 NOTE — Telephone Encounter (Signed)
Appointments made and avs printed for patient °

## 2015-04-29 ENCOUNTER — Other Ambulatory Visit: Payer: Self-pay | Admitting: Oncology

## 2015-04-29 ENCOUNTER — Ambulatory Visit (HOSPITAL_BASED_OUTPATIENT_CLINIC_OR_DEPARTMENT_OTHER): Payer: BLUE CROSS/BLUE SHIELD

## 2015-04-29 ENCOUNTER — Ambulatory Visit: Payer: BLUE CROSS/BLUE SHIELD

## 2015-04-29 VITALS — BP 135/87 | HR 108 | Temp 98.3°F

## 2015-04-29 DIAGNOSIS — C50912 Malignant neoplasm of unspecified site of left female breast: Secondary | ICD-10-CM

## 2015-04-29 DIAGNOSIS — D702 Other drug-induced agranulocytosis: Secondary | ICD-10-CM | POA: Diagnosis not present

## 2015-04-29 DIAGNOSIS — C50411 Malignant neoplasm of upper-outer quadrant of right female breast: Secondary | ICD-10-CM | POA: Diagnosis not present

## 2015-04-29 DIAGNOSIS — C7951 Secondary malignant neoplasm of bone: Principal | ICD-10-CM

## 2015-04-29 MED ORDER — FILGRASTIM 300 MCG/0.5ML IJ SOSY: 300.0000 ug | PREFILLED_SYRINGE | Freq: Once | INTRAMUSCULAR | Status: DC

## 2015-04-29 MED ORDER — TBO-FILGRASTIM 300 MCG/0.5ML ~~LOC~~ SOSY
300.0000 ug | PREFILLED_SYRINGE | Freq: Once | SUBCUTANEOUS | Status: AC
  Administered 2015-04-29: 300 ug via SUBCUTANEOUS
  Filled 2015-04-29: qty 0.5

## 2015-04-30 ENCOUNTER — Ambulatory Visit (HOSPITAL_BASED_OUTPATIENT_CLINIC_OR_DEPARTMENT_OTHER): Payer: BLUE CROSS/BLUE SHIELD

## 2015-04-30 ENCOUNTER — Ambulatory Visit: Payer: BLUE CROSS/BLUE SHIELD

## 2015-04-30 ENCOUNTER — Ambulatory Visit (HOSPITAL_COMMUNITY)
Admission: RE | Admit: 2015-04-30 | Discharge: 2015-04-30 | Disposition: A | Discharge status: Home / Self Care | Payer: BLUE CROSS/BLUE SHIELD | Source: Ambulatory Visit | Attending: Radiology | Admitting: Radiology

## 2015-04-30 ENCOUNTER — Ambulatory Visit (HOSPITAL_COMMUNITY)
Admission: RE | Admit: 2015-04-30 | Discharge: 2015-04-30 | Disposition: A | Discharge status: Home / Self Care | Payer: BLUE CROSS/BLUE SHIELD | Source: Ambulatory Visit | Attending: Oncology | Admitting: Oncology

## 2015-04-30 ENCOUNTER — Other Ambulatory Visit: Payer: Self-pay | Admitting: *Deleted

## 2015-04-30 ENCOUNTER — Telehealth: Payer: Self-pay | Admitting: Oncology

## 2015-04-30 VITALS — BP 123/69 | HR 93 | Temp 98.2°F

## 2015-04-30 DIAGNOSIS — D702 Other drug-induced agranulocytosis: Secondary | ICD-10-CM | POA: Insufficient documentation

## 2015-04-30 DIAGNOSIS — C7951 Secondary malignant neoplasm of bone: Secondary | ICD-10-CM | POA: Insufficient documentation

## 2015-04-30 DIAGNOSIS — J910 Malignant pleural effusion: Secondary | ICD-10-CM

## 2015-04-30 DIAGNOSIS — C50911 Malignant neoplasm of unspecified site of right female breast: Secondary | ICD-10-CM | POA: Diagnosis present

## 2015-04-30 DIAGNOSIS — C50919 Malignant neoplasm of unspecified site of unspecified female breast: Secondary | ICD-10-CM

## 2015-04-30 DIAGNOSIS — C50411 Malignant neoplasm of upper-outer quadrant of right female breast: Secondary | ICD-10-CM | POA: Diagnosis not present

## 2015-04-30 DIAGNOSIS — C50912 Malignant neoplasm of unspecified site of left female breast: Secondary | ICD-10-CM

## 2015-04-30 DIAGNOSIS — Z9889 Other specified postprocedural states: Secondary | ICD-10-CM | POA: Insufficient documentation

## 2015-04-30 MED ORDER — TBO-FILGRASTIM 300 MCG/0.5ML ~~LOC~~ SOSY
300.0000 ug | PREFILLED_SYRINGE | Freq: Once | SUBCUTANEOUS | Status: AC
  Administered 2015-04-30: 300 ug via SUBCUTANEOUS
  Filled 2015-04-30: qty 0.5

## 2015-04-30 MED ORDER — FILGRASTIM 300 MCG/0.5ML IJ SOSY: 300.0000 ug | PREFILLED_SYRINGE | Freq: Once | INTRAMUSCULAR | Status: DC

## 2015-04-30 NOTE — Telephone Encounter (Signed)
s.w. pt and confirmed appts....pt ok adn aware °

## 2015-04-30 NOTE — Procedures (Addendum)
Successful US guided right thoracentesis. Yielded 1.3 liters of yellow/amber colored fluid. Pt tolerated procedure well. No immediate complications.  Specimen was not sent for labs. CXR ordered.  Tsosie Billing D PA-C 04/30/2015 12:32 PM    Post procedure CXR revealed loculated subpulmonic pneumothorax right lung base following thoracentesis. Imaging and case discussed with Dr. Earleen Newport who feels this is Ex Vacuo in nature. The patient does complain of some pressure on her right side more so with deep inspiration. She denies any worsening shortness of breath, lightheadedness, dizziness, or nausea. Her vitals signs and oxygen are stable. Dr. Earleen Newport recommends observation for 1 hour with vital signs, he does not feel a repeat CXR is needed. I will have the patient monitored in our short stay unit and evaluate her prior to discharge home.   Tsosie Billing PA-C Interventional Radiology  04/30/15  1:09 PM

## 2015-04-30 NOTE — Progress Notes (Signed)
Patient seen and examined. She states her right sided chest pressure has resolved completely. She denies any chest pain or shortness of breath. Her vitals and oxygen level have remained stable. She denies any nausea and tolerating a snack without difficulty. She denies any lightheadedness or dizziness. She states she would like to go home and feels much better.    Lungs: CTA b/l, Abd: Soft, NT, ND.  Stable for discharge home. Instructions given to patient if she develops symptoms such as listed above to go to ED, she verbalizes understanding of plan.   Tsosie Billing PA-C Interventional Radiology  04/30/15  2:22 PM

## 2015-04-30 NOTE — Progress Notes (Signed)
Visit to short stay after Thoracentesis for VS. Sitting comfortably without pain or respiratory distress.

## 2015-05-01 ENCOUNTER — Ambulatory Visit (HOSPITAL_BASED_OUTPATIENT_CLINIC_OR_DEPARTMENT_OTHER): Payer: BLUE CROSS/BLUE SHIELD

## 2015-05-01 VITALS — BP 124/71 | HR 99 | Temp 98.4°F | Resp 20

## 2015-05-01 DIAGNOSIS — D702 Other drug-induced agranulocytosis: Secondary | ICD-10-CM

## 2015-05-01 DIAGNOSIS — C50411 Malignant neoplasm of upper-outer quadrant of right female breast: Secondary | ICD-10-CM | POA: Diagnosis not present

## 2015-05-01 MED ORDER — TBO-FILGRASTIM 300 MCG/0.5ML ~~LOC~~ SOSY
300.0000 ug | PREFILLED_SYRINGE | Freq: Once | SUBCUTANEOUS | Status: AC
  Administered 2015-05-01: 300 ug via SUBCUTANEOUS

## 2015-05-04 ENCOUNTER — Other Ambulatory Visit: Payer: Self-pay | Admitting: Oncology

## 2015-05-05 ENCOUNTER — Ambulatory Visit: Payer: BLUE CROSS/BLUE SHIELD

## 2015-05-05 ENCOUNTER — Other Ambulatory Visit: Payer: BLUE CROSS/BLUE SHIELD

## 2015-05-05 ENCOUNTER — Ambulatory Visit: Payer: BLUE CROSS/BLUE SHIELD | Admitting: Oncology

## 2015-05-05 ENCOUNTER — Telehealth: Payer: BLUE CROSS/BLUE SHIELD | Admitting: *Deleted

## 2015-05-05 DIAGNOSIS — J910 Malignant pleural effusion: Secondary | ICD-10-CM

## 2015-05-05 DIAGNOSIS — C50011 Malignant neoplasm of nipple and areola, right female breast: Secondary | ICD-10-CM

## 2015-05-05 NOTE — Telephone Encounter (Signed)
Call from patient "requesting Dr. [name] order a Portable Concentrator.  Fax order to [phone].  I called [facility] because I do not have enough oxygen to go out of the home.  I'm struggling with two tanks not having enough oxygen ready to use and I have to go to [facility] today with only four hours left in the tank.  The portable concentrator will provide twenty hours."  Will notify Dr. [name]. Entered eRx.

## 2015-05-06 ENCOUNTER — Encounter: Payer: Self-pay | Admitting: *Deleted

## 2015-05-06 ENCOUNTER — Other Ambulatory Visit: Payer: Self-pay | Admitting: *Deleted

## 2015-05-06 NOTE — Progress Notes (Signed)
This RN faxed written order for O2 concentrator to [facility] with concentrator.

## 2015-05-06 NOTE — Telephone Encounter (Signed)
[facility].  Did not receive eRx for portable concentrator.  Required written order to be faxed.  Collaborative nurse notified.

## 2015-05-07 ENCOUNTER — Other Ambulatory Visit: Payer: Self-pay | Admitting: Oncology

## 2015-05-07 ENCOUNTER — Ambulatory Visit
Admission: RE | Admit: 2015-05-07 | Discharge: 2015-05-07 | Disposition: A | Discharge status: Home / Self Care | Payer: BLUE CROSS/BLUE SHIELD | Source: Ambulatory Visit | Attending: Radiation Oncology | Admitting: Radiation Oncology

## 2015-05-07 ENCOUNTER — Encounter: Payer: Self-pay | Admitting: Radiation Oncology

## 2015-05-07 VITALS — BP 126/84 | HR 96 | Temp 98.3°F | Ht 67.5 in | Wt 169.0 lb

## 2015-05-07 DIAGNOSIS — C7931 Secondary malignant neoplasm of brain: Secondary | ICD-10-CM

## 2015-05-07 NOTE — Progress Notes (Signed)
Radiation Oncology         [phone] ________________________________  Name: Nicole Valentine MRN: [id number]  Date: 05/07/2015  DOB: [birth year]  Follow-Up Visit Note  Outpatient  CC: [name], MD  Consuela Mimes, MD  Summary of Radiation Treatment and Diagnosis:     ICD-9-CM ICD-10-CM  1. Brain metastases 198.3 C79.31   Radiation treatment dates:   03/25/2015-04/13/2015 Site/dose:   Whole brain/ 35 Gy in 14 fractions  Previous RT to other sites as well.  Narrative:  The patient returns today for routine follow-up of radiation completed 04/13/15 to her Whole Brain. She denies any problems related to her whole brain radiation. She denies nausea or headaches. She had a thorancentesis on 04/30/15. She is using oxygen since around the beginning of September at [facility] unless she increases her activity or has a coughing spell and increases her oxygen to 2 Liters. She will receive Chemotherapy on 12/20 and also have an appointment with Dr. [name] the same day. She missed her appointment at [facility] due to severe diarrhea that day and has not yet rescheduled. Denies visual changes or weakness on one side of her body. She is not on steroids at this time. She continues to experience pain in her bilateral ribs. She describes the pain as a soreness/tenderness that is not severe and does not keep her up at night. Reports her ankle edema began when she got out of the hospital and she wears compression hose while at home.   ALLERGIES:  is allergic to almond meal; almond oil; other; tegaderm ag mesh; and vancomycin.  Meds: Current Outpatient Prescriptions  Medication Sig Dispense Refill  . cholecalciferol (VITAMIN D) 1000 UNITS tablet Take 2,000 Units by mouth 2 (two) times daily.     . ciprofloxacin (CIPRO) 500 MG tablet Take 1 tablet (500 mg total) by mouth 2 (two) times daily. 14 tablet 2  . dextromethorphan-guaiFENesin (MUCINEX DM) 30-600 MG 12hr tablet Take 1 tablet by mouth 2  (two) times daily as needed for cough.    . levothyroxine (SYNTHROID, LEVOTHROID) 175 MCG tablet Take 175 mcg by mouth daily before breakfast.    . loratadine-pseudoephedrine (CLARITIN-D 12-HOUR) 5-120 MG tablet Take 1 tablet by mouth 2 (two) times daily.    [name] LORazepam (ATIVAN) 0.5 MG tablet Take 1 tablet (0.5 mg total) by mouth at bedtime as needed (Nausea or vomiting). 60 tablet 2  . metroNIDAZOLE (FLAGYL) 500 MG tablet Take 1 tablet (500 mg total) by mouth every 8 (eight) hours. 90 tablet 2  . saccharomyces boulardii (FLORASTOR) 250 MG capsule Take 1 capsule (250 mg total) by mouth 2 (two) times daily.    . naproxen sodium (ALEVE) 220 MG tablet Take 440 mg by mouth 2 (two) times daily with a meal. Reported on 05/07/2015    . pantoprazole (PROTONIX) 40 MG tablet Take 1 tablet (40 mg total) by mouth daily at 12 noon. (Patient not taking: Reported on 05/07/2015) 30 tablet 2   No current facility-administered medications for this encounter.    Physical Findings: The patient is in no acute distress. Patient is alert and oriented.  height is 5' 7.5" (1.715 m) and weight is 169 lb (76.658 kg). Her temperature is 98.3 F (36.8 C). Her blood pressure is 126/84 and her pulse is 96. Her oxygen saturation is 98%. .  O2 per [place] Right arm lymphedema. Bilateral ankle edema.  Oral cavity without thrush. Neurologic Finger to nose test is intact. Vision grossly intact. Strength is symmetric throughout. Cranial  nerves are grossly intact. Judgement and insight are intact. She is alert and oriented x3.    Lab Findings: Lab Results  Component Value Date   WBC 0.8* 04/28/2015   HGB 10.6* 04/28/2015   HCT 33.5* 04/28/2015   MCV 99.4 04/28/2015   PLT 229 04/28/2015     Radiographic Findings: Dg Chest 1 View  04/30/2015  CLINICAL DATA:  Post right thoracentesis EXAM: CHEST 1 VIEW COMPARISON:  04/30/2015 FINDINGS: Decreased right pleural effusion following thoracentesis. Loculated subpulmonic pneumothorax  in the right lung base has developed since the earlier study today. There remains compressive atelectasis in much of the right lower lobe and a portion of the right upper lobe Small left effusion and left lower lobe airspace disease. Port-A-Cath tip SVC IMPRESSION: Loculated subpulmonic pneumothorax right lung base following thoracentesis. Electronically Signed   By: Franchot Gallo M.D.   On: 04/30/2015 12:48   Dg Chest 2 View  04/30/2015  CLINICAL DATA:  Metastatic breast cancer.  Evaluate effusion EXAM: CHEST  2 VIEW COMPARISON:  04/12/2015 FINDINGS: Large right pleural effusion shows mild interval enlargement. There is compressive atelectasis in the right lower lobe as well as some atelectasis in the right upper lobe which has progressed. Small left effusion has enlarged since the  prior study. Port-A-Cath tip in the SVC.  No significant vascular congestion. IMPRESSION: Progression of bilateral pleural effusions, large on the right and small on the left. Electronically Signed   By: Franchot Gallo M.D.   On: 04/30/2015 10:29   Dg Chest 2 View  04/12/2015  CLINICAL DATA:  Worsening shortness of breath, history of right breast carcinoma with bone metastasis EXAM: CHEST  2 VIEW COMPARISON:  Chest x-ray of 03/30/2015 FINDINGS: There is little change in the volume of the moderate sized right pleural effusion with volume loss at the right lung base. The left lung is clear. A tiny left pleural effusion is difficult to exclude. Mediastinal and hilar contours are unchanged and the heart is within normal limits in size. A left-sided Port-A-Cath is present with the tip seen to the mid SVC. No acute bony abnormality is seen. IMPRESSION: Little change in moderate size right pleural effusion with volume loss at the right lung base. Cannot exclude a tiny left pleural effusion. Electronically Signed   By: Ivar Drape M.D.   On: 04/12/2015 10:47   Dg Abd 1 View  04/21/2015  CLINICAL DATA:  Pneumoperitoneum. EXAM:  ABDOMEN - 1 VIEW COMPARISON:  CT scan of April 19, 2015. FINDINGS: No abnormal bowel dilatation is noted. Bilateral pleural effusions are noted, with right much larger than left. No definite pneumoperitoneum is seen on this study. Probable pneumatosis of the cecum is noted as described on prior CT scan. No abnormal calcifications are noted IMPRESSION: No evidence of bowel obstruction or ileus. Bilateral pleural effusions are noted, with right much larger than left. Probable pneumatosis of the cecum is noted as described on prior CT scan. No definite free air is noted on this exam. Electronically Signed   By: Marijo Conception, M.D.   On: 04/21/2015 09:15   Ct Chest W Contrast  04/19/2015  CLINICAL DATA:  49 year old female with history of right-sided breast cancer diagnosed in 2013 with known bone metastasis. Status post radiation therapy and chemotherapy, now complete. Chemotherapy scheduled to begin again on 04/20/2015. Status post mastectomy. Cough and shortness of breath for the past 3-4 months. EXAM: CT CHEST WITH CONTRAST TECHNIQUE: Multidetector CT imaging of the chest was performed  during intravenous contrast administration. CONTRAST:  18mL OMNIPAQUE IOHEXOL 300 MG/ML  SOLN COMPARISON:  PET-CT 03/17/2015.  Chest CT 03/03/2015. FINDINGS: Mediastinum/Lymph Nodes: Heart size is normal. Small amount of pericardial fluid and/or thickening, similar to the prior study and unlikely to be of hemodynamic significance at this time. Worsening multifocal lymphadenopathy throughout the mediastinum, most evident in the anterior mediastinum where multiple borderline enlarged and mildly enlarged lymph nodes are noted measuring up to 1 cm. Subcarinal adenopathy also increased measuring up to 1 cm in short axis. Bulky lymphatic tissue at the right hilum, difficult to discretely measure, but measuring up to 12 mm in thickness. Esophagus is unremarkable in appearance. No axillary lymphadenopathy. No internal mammary  lymphadenopathy. Left-sided internal jugular single-lumen porta cath with tip terminating in the distal superior vena cava. Lungs/Pleura: The previous described right middle lobe lesion is difficult to visualize on today's examination, but appears significantly increased in size, currently measuring approximately 5.9 x 3.8 cm (image 33 of series 2), currently occupying nearly the entire right middle lobe (with remaining portions atelectatic). Marked interval enlargement of a malignant right pleural effusion, with increasing areas of pleural thickening and enhancing pleural nodularity throughout the right hemithorax, most evident at the base of the right hemithorax where the largest pleural-based mass measures 1.8 x 4.3 cm (image 59 of series 2). Extensive passive atelectasis in portions of the right lower lobe and right upper lobe. Throughout the aerated portions of the right upper and right lower lobes there is extensive septal thickening, somewhat nodular in appearance, concerning for worsening lymphangitic spread of tumor. There is also small left-sided pleural effusion which layers dependently and is simple in appearance. This is associated with some passive subsegmental atelectasis in the left lower lobe. Upper Abdomen: Multiple ill-defined hypovascular areas in the liver are new compared to prior studies, and appear concerning for probable widespread metastatic disease to the liver, the largest of which is estimated to measure approximately 1.7 x 1.2 cm in segment 2 of the liver (image 55 of series 2). New right adrenal mass measuring 3.1 x 1.5 cm, presumably a metastatic lesion. Incompletely visualize soft tissue near the hepatic hilum immediately inferior to the portal vein measuring at least 1.5 x 2.8 cm (image 67 of series 2), likely to represent a metastatic hepatoduodenal ligament lymph node. Multiple borderline enlarged and mildly enlarged gastrohepatic lymph nodes measuring up to 1 cm in short axis.  Multiple borderline enlarged celiac axis lymph nodes. Pneumoperitoneum visualized in the upper abdomen. Musculoskeletal/Soft Tissues: Ill-defined areas of sclerosis are noted in the manubrium and superior aspect of the sternum, similar to prior studies (nonspecific). Compression fracture of superior endplate of T9 is similar to the prior study, with approximately 25% loss of central vertebral body height on the left side. IMPRESSION: 1. Pneumoperitoneum. Correlation with CT of the abdomen and pelvis is suggested to identify potential source for this unexpected finding if clinically appropriate. 2. Marked progression of disease as evidenced by enlarging right middle lobe mass, markedly enlarged malignant right pleural effusion, and evidence of lymphangitic spread of disease throughout the right lung, with increasing right hilar and mediastinal lymphadenopathy, as discussed above. In addition, there are likely multifocal new hepatic metastases, a right adrenal metastasis, and multiple upper abdominal lymph nodes, concerning for metastatic disease. 3. New small left pleural effusion with some passive subsegmental atelectasis in the left lower lobe. 4. Additional incidental findings, as above. Critical Value/emergent results were called by telephone at the time of interpretation on 04/19/2015 at  9:52 am to Dr. Lurline Del, who verbally acknowledged these results. Electronically Signed   By: Vinnie Langton M.D.   On: 04/19/2015 09:53   Ct Abdomen Pelvis W Contrast  04/19/2015  CLINICAL DATA:  Patient with history of metastatic breast cancer. On restaging chest CT earlier today there was evidence of pneumoperitoneum. Evaluation of the abdomen and pelvis. EXAM: CT ABDOMEN AND PELVIS WITH CONTRAST TECHNIQUE: Multidetector CT imaging of the abdomen and pelvis was performed using the standard protocol following bolus administration of intravenous contrast. CONTRAST:  49mL OMNIPAQUE IOHEXOL 300 MG/ML SOLN, 1  OMNIPAQUE IOHEXOL 300 MG/ML SOLN COMPARISON:  CT chest 04/19/2015; PET-CT 03/17/2015. FINDINGS: Lower chest: Patient had chest CT same day. See dedicated chest CT dictation of chest findings including right middle lobe mass, malignant right pleural effusion, lymphangitic spread of disease throughout the right lung and increasing adenopathy within the right hilum. Small left pleural effusion. Hepatobiliary: Interval development of innumerable low-attenuation lesions throughout the liver as well as interval increase in size of lesion within the central aspect of the liver measuring 1.4 x 2.1 cm (image 35; series 2), previously 1.3 x 1.1 cm. Reference lesion within the right hepatic lobe measures 1.5 x 1.1 cm (image 44; series 2). Reference lesion within the left hepatic lobe measures 1.0 x 1.0 cm (image 33; series 2). Pancreas: Unremarkable Spleen: Unremarkable Adrenals/Urinary Tract: Interval increase in size of right adrenal metastasis measuring 3.2 cm, previously 1.4 cm (image 38; series 2). Left adrenal gland is unremarkable. Kidneys enhance symmetrically with contrast. No hydronephrosis. Urinary bladder is unremarkable. Stomach/Bowel: Small amount of fluid in the pelvis. Interval development of pneumatosis involving the cecum, ascending and proximal transverse colon. There is a small amount of free intraperitoneal air. Oral contrast material is demonstrated to the level of the descending colon. No evidence for bowel obstruction. Vascular/Lymphatic: Unchanged retrocrural adenopathy measuring up to 1.3 cm (image 33; series 2), previously 1.0 cm. Normal caliber abdominal aorta. Other: Uterus and adnexal structures are unremarkable. Musculoskeletal: Compression fracture of the superior endplate of the T9 vertebral body, similar prior exam. IMPRESSION: Free intraperitoneal air is demonstrated. There is marked pneumatosis of the cecum, ascending and proximal transverse colon. Interval progression of disease with  innumerable hepatic metastasis and enlarged right adrenal metastasis. Progression of metastatic disease within the chest as described on chest CT earlier same day. Critical Value/emergent results were called by telephone at the time of interpretation on 04/19/2015 at 6:43 pm to Dr. Lurline Del , who verbally acknowledged these results. Electronically Signed   By: Lovey Newcomer M.D.   On: 04/19/2015 18:47   US Thoracentesis Asp Pleural Space W/img Guide  04/30/2015  INDICATION: Symptomatic right sided pleural effusion EXAM: US THORACENTESIS ASP PLEURAL SPACE W/IMG GUIDE COMPARISON:  CXR 04/30/15. MEDICATIONS: None COMPLICATIONS: None immediate TECHNIQUE: Informed written consent was obtained from the patient after a discussion of the risks, benefits and alternatives to treatment. A timeout was performed prior to the initiation of the procedure. Initial ultrasound scanning demonstrates a right pleural effusion. The lower chest was prepped and draped in the usual sterile fashion. 1% lidocaine was used for local anesthesia. Under direct ultrasound guidance, a 19 gauge, 7-cm, Yueh catheter was introduced. An ultrasound image was saved for documentation purposes. The thoracentesis was performed. The catheter was removed and a dressing was applied. The patient tolerated the procedure well without immediate post procedural complication. The patient was escorted to have an upright chest radiograph. FINDINGS: A total of approximately 1.3 liters of  yellow/amber colored fluid was removed. Post procedure CXR revealed loculated subpulmonic pneumothorax right lung base following thoracentesis. Imaging and case discussed with Dr. Earleen Newport who feels this is Ex Vacuo in nature. The patient does complain of some pressure on her right side more so with deep inspiration. She denies any worsening shortness of breath, lightheadedness, dizziness, or nausea. Her vitals signs and oxygen are stable. Dr. Earleen Newport recommends observation for 1  hour with vital signs, he does not feel a repeat CXR is needed. The patient was monitored in the short stay unit and her vital signs and oxygen remained stable. Her symptoms completely resolved and she was discharged home with instructions. IMPRESSION: Successful ultrasound-guided right sided thoracentesis yielding 1.3 liters of pleural fluid. Read By:  Tsosie Billing PA-C Electronically Signed   By: Corrie Mckusick D.O.   On: 04/30/2015 15:01    Impression/Plan:  No new neurologic issues to suggest relapse or progression in brain s/p WBRT. We will schedule her next brain MRI for mid February. We will follow up after this MRI to discuss the results. She is waiting to see if she is eligible for a systemic therapy in January.  _____________________________________   Eppie Gibson, MD   This document serves as a record of services personally performed by Eppie Gibson, MD. It was created on her behalf by Arlyce Harman, a trained medical scribe. The creation of this record is based on the scribe's personal observations and the provider's statements to them. This document has been checked and approved by the attending provider.

## 2015-05-07 NOTE — Progress Notes (Signed)
Nicole Valentine presents for follow up of radiation completed 04/13/15 to her Whole Brain. She denies any problems related to her whole brain radiation. She denies nausea or headaches. She had a thorancentesis on 04/30/15. She is using oxygen since around the beginning of September at [facility] unless she increases her activity or has a coughing spell and increases her oxygen to 2 Liters. She will receive Chemotherapy on 12/20 and also have an appointment with Dr. [name] the same day.   .BP 126/84 mmHg  Pulse 96  Temp(Src) 98.3 F (36.8 C)  Ht 5' 7.5" (1.715 m)  Wt 169 lb (76.658 kg)  BMI 26.06 kg/m2  SpO2 98%  LMP 03/29/2012   Wt Readings from Last 3 Encounters:  05/07/15 169 lb (76.658 kg)  04/28/15 168 lb 4.8 oz (76.34 kg)  04/19/15 157 lb (71.215 kg)

## 2015-05-11 ENCOUNTER — Ambulatory Visit (HOSPITAL_BASED_OUTPATIENT_CLINIC_OR_DEPARTMENT_OTHER): Payer: BLUE CROSS/BLUE SHIELD | Admitting: Oncology

## 2015-05-11 ENCOUNTER — Ambulatory Visit: Payer: BLUE CROSS/BLUE SHIELD | Admitting: Nurse Practitioner

## 2015-05-11 ENCOUNTER — Other Ambulatory Visit: Payer: BLUE CROSS/BLUE SHIELD

## 2015-05-11 ENCOUNTER — Other Ambulatory Visit (HOSPITAL_BASED_OUTPATIENT_CLINIC_OR_DEPARTMENT_OTHER): Payer: BLUE CROSS/BLUE SHIELD

## 2015-05-11 ENCOUNTER — Ambulatory Visit (HOSPITAL_BASED_OUTPATIENT_CLINIC_OR_DEPARTMENT_OTHER): Payer: BLUE CROSS/BLUE SHIELD

## 2015-05-11 VITALS — BP 134/88 | HR 97 | Temp 97.9°F | Resp 18 | Ht 67.5 in | Wt 168.9 lb

## 2015-05-11 DIAGNOSIS — Z5111 Encounter for antineoplastic chemotherapy: Secondary | ICD-10-CM | POA: Diagnosis not present

## 2015-05-11 DIAGNOSIS — C50411 Malignant neoplasm of upper-outer quadrant of right female breast: Secondary | ICD-10-CM

## 2015-05-11 DIAGNOSIS — K668 Other specified disorders of peritoneum: Secondary | ICD-10-CM

## 2015-05-11 DIAGNOSIS — C50919 Malignant neoplasm of unspecified site of unspecified female breast: Secondary | ICD-10-CM

## 2015-05-11 DIAGNOSIS — Z5189 Encounter for other specified aftercare: Secondary | ICD-10-CM | POA: Diagnosis not present

## 2015-05-11 DIAGNOSIS — R0602 Shortness of breath: Secondary | ICD-10-CM | POA: Diagnosis not present

## 2015-05-11 DIAGNOSIS — C7951 Secondary malignant neoplasm of bone: Secondary | ICD-10-CM

## 2015-05-11 DIAGNOSIS — C50911 Malignant neoplasm of unspecified site of right female breast: Secondary | ICD-10-CM

## 2015-05-11 LAB — COMPREHENSIVE METABOLIC PANEL
ALBUMIN: 2.7 g/dL — AB (ref 3.5–5.0)
ALK PHOS: 66 U/L (ref 40–150)
ALT: <9 U/L (ref 0–55)
AST: 17 U/L (ref 5–34)
Anion Gap: 10 mEq/L (ref 3–11)
BUN: 8 mg/dL (ref 7.0–26.0)
CALCIUM: 8.8 mg/dL (ref 8.4–10.4)
CHLORIDE: 97 meq/L — AB (ref 98–109)
CO2: 32 mEq/L — ABNORMAL HIGH (ref 22–29)
Creatinine: 0.6 mg/dL (ref 0.6–1.1)
GLUCOSE: 81 mg/dL (ref 70–140)
POTASSIUM: 4.3 meq/L (ref 3.5–5.1)
SODIUM: 139 meq/L (ref 136–145)
Total Bilirubin: 0.48 mg/dL (ref 0.20–1.20)
Total Protein: 6 g/dL — ABNORMAL LOW (ref 6.4–8.3)

## 2015-05-11 LAB — CBC WITH DIFFERENTIAL/PLATELET
BASO%: 0.5 % (ref 0.0–2.0)
BASOS ABS: 0 10*3/uL (ref 0.0–0.1)
EOS ABS: 0 10*3/uL (ref 0.0–0.5)
EOS%: 0.1 % (ref 0.0–7.0)
HCT: 34.9 % (ref 34.8–46.6)
HEMOGLOBIN: 11.2 g/dL — AB (ref 11.6–15.9)
LYMPH%: 6.6 % — ABNORMAL LOW (ref 14.0–49.7)
MCH: 31.2 pg (ref 25.1–34.0)
MCHC: 32.1 g/dL (ref 31.5–36.0)
MCV: 97.3 fL (ref 79.5–101.0)
MONO#: 0.2 10*3/uL (ref 0.1–0.9)
MONO%: 6.2 % (ref 0.0–14.0)
NEUT#: 3.2 10*3/uL (ref 1.5–6.5)
NEUT%: 86.6 % — AB (ref 38.4–76.8)
Platelets: 270 10*3/uL (ref 145–400)
RBC: 3.58 10*6/uL — ABNORMAL LOW (ref 3.70–5.45)
RDW: 17.8 % — ABNORMAL HIGH (ref 11.2–14.5)
WBC: 3.7 10*3/uL — ABNORMAL LOW (ref 3.9–10.3)
lymph#: 0.2 10*3/uL — ABNORMAL LOW (ref 0.9–3.3)

## 2015-05-11 MED ORDER — SODIUM CHLORIDE 0.9 % IJ SOLN
10.0000 mL | INTRAMUSCULAR | Status: DC | PRN
  Administered 2015-05-11: 10 mL
  Filled 2015-05-11: qty 10

## 2015-05-11 MED ORDER — HEPARIN SOD (PORK) LOCK FLUSH 100 UNIT/ML IV SOLN
500.0000 [IU] | Freq: Once | INTRAVENOUS | Status: AC | PRN
  Administered 2015-05-11: 500 [IU]
  Filled 2015-05-11: qty 5

## 2015-05-11 MED ORDER — FUROSEMIDE 20 MG PO TABS: 20.0000 mg | ORAL_TABLET | Freq: Every day | ORAL | Status: DC

## 2015-05-11 MED ORDER — PEGFILGRASTIM 6 MG/0.6ML ~~LOC~~ PSKT
6.0000 mg | PREFILLED_SYRINGE | Freq: Once | SUBCUTANEOUS | Status: AC
  Administered 2015-05-11: 6 mg via SUBCUTANEOUS
  Filled 2015-05-11: qty 0.6

## 2015-05-11 MED ORDER — SODIUM CHLORIDE 0.9 % IV SOLN
1.4000 mg/m2 | Freq: Once | INTRAVENOUS | Status: AC
  Administered 2015-05-11: 2.6 mg via INTRAVENOUS
  Filled 2015-05-11: qty 5.2

## 2015-05-11 MED ORDER — SODIUM CHLORIDE 0.9 % IV SOLN
Freq: Once | INTRAVENOUS | Status: AC
  Administered 2015-05-11: 10:00:00 via INTRAVENOUS

## 2015-05-11 MED ORDER — SODIUM CHLORIDE 0.9 % IV SOLN
Freq: Once | INTRAVENOUS | Status: AC
  Administered 2015-05-11: 10:00:00 via INTRAVENOUS
  Filled 2015-05-11: qty 4

## 2015-05-11 NOTE — Patient Instructions (Signed)
[facility] Discharge Instructions for Patients Receiving Chemotherapy  Today you received the following chemotherapy agents: Eribulin (Haloven)  To help prevent nausea and vomiting after your treatment, we encourage you to take your nausea medication : Lorazepma 0.5 mg as needed for nausea.   If you develop nausea and vomiting that is not controlled by your nausea medication, call the clinic.   BELOW ARE SYMPTOMS THAT SHOULD BE REPORTED IMMEDIATELY:  *FEVER GREATER THAN 100.5 F  *CHILLS WITH OR WITHOUT FEVER  NAUSEA AND VOMITING THAT IS NOT CONTROLLED WITH YOUR NAUSEA MEDICATION  *UNUSUAL SHORTNESS OF BREATH  *UNUSUAL BRUISING OR BLEEDING  TENDERNESS IN MOUTH AND THROAT WITH OR WITHOUT PRESENCE OF ULCERS  *URINARY PROBLEMS  *BOWEL PROBLEMS  UNUSUAL RASH Items with * indicate a potential emergency and should be followed up as soon as possible.  Feel free to call the clinic you have any questions or concerns. The clinic phone number is [phone].  Please show the [facility] at check-in to the Emergency Department and triage nurse.

## 2015-05-11 NOTE — Progress Notes (Signed)
And a Patient ID: Nicole Valentine, female   DOB: [birth year], 49 y.o.   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 She had a mammogram performed that showed dense breasts bilaterally. Ultrasound of the right breast showed 2.3 x 1.9 cm area of abnormality with multiple abnormal lymph nodes. MRI of the bilateral breasts showed asymmetrical enhancement throughout the right breast consistent with multicentric disease. An ultrasound-guided biopsy performed. The known area of disease measured 1.9 x 1.9 x 2.3 cm. Multiple abnormal positive right axillary lymph nodes were noted. 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 Biopsy of the right axillary lymph node was positive for malignancy with extracapsular extension. Patient was originally seen by Dr. [name] Dr. [name] and Dr. [name]. She has elected to have a right mastectomy eventually and declined biopsies of any other areas within the breast."  She went on to receive neoadjuvant chemotherapy and attained a complete pathologic response, as documented below  METASTATIC DISEASE: From the prior summary note: [place] noted a very small right supraclavicular mass 09/25/2014, which she brought to our attention. She was having some allergy symptoms at the time so we decided to reevaluate this after a few weeks and on 10/22/2014 as the mass persisted we obtained a restaging neck CT scan, 10/28/2014. There was bilateral supraclavicular adenopathy, but also a large right pleural  effusion was incidentally noted. We proceeded to right thoracentesis 10/29/2014, and a liter of hazy yellow fluid was removed. Cytology from this procedure ([id number]) showed malignant cells consistent with adenocarcinoma, estrogen and progesterone receptor negative, HER-2 not amplified, with a signals ratio 1.12 and the number per cell being 2.75, and with an MIB-1 of 80%. I called Nicole Valentine with these results and set her up for a PET scan performed 11/06/2014, which shows widespread metastatic disease involving particularly the right lung, liver, and bones, but also the left lung, right adrenal, and multiple lymph node areas.  Her subsequent history is as detailed below.  INTERVAL HISTORY: Nicole Valentine returns today for follow-up of her metastatic triple negative breast cancer. Today is day 1 cycle 2 of eribulin, to be given every 14 days with onpro support. She did well with the day 1 treatment on the original day 1, day 8 plan, but developed significant cytopenias which did not allow her to receive the day 8 treatment. Accordingly we are switching to every 2 week treatments beginning with today's dose.  She had an appointment with Dr. [name] last week but there was some confusion regarding the time and she did not get to see him. She has rescheduled that for January. She does 7 appointment at [facility] 05/18/2015. I have encouraged her to keep these appointments and consider other options in case the irregular and does not work for her.   REVIEW OF S/YSTEMS: She remains very short of breath. She does not feel the thoracentesis she had December 9 was particularly helpful. She is now on oxygen 24 7. She sleeps pretty much vertical on a chair and when she tries to lie back she starts to cough.  She has had no fever, no hemoptysis, and phlegm is clear. Her appetite is down. Her taste is altered and she can't stand sweets. She can't stand highly spiced foods either. Salty food is okay. She has had no nausea or  vomiting. Her vision is okay. There have been no headaches. She is having bowel movements once or twice daily, soft. On the plus side, the kitchen renovation is finally completed. Her parents are still staying with them (occupying the living room I believe)" being very helpful". A detailed review of systems was otherwise stable  PAST MEDICAL HISTORY: Past Medical History  Diagnosis Date  . Complication of anesthesia     her father and uncle both had very difficult time waking up-was  something they told them may be hereditory. she will find out.  [name] Hypothyroidism   . 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 Radiation 01/21/13-03/06/13    Right Breast  . Breast cancer ([place]) dx'd 04-10-12-rt    PAST SURGICAL HISTORY: Past Surgical History  Procedure Laterality Date  . Wisdom tooth extraction    . Portacath placement  04/22/2012    Procedure: INSERTION PORT-A-CATH;  Surgeon: [name], MD;  Location: [facility];  Service: General;  Laterality: Left;  [name] Modified mastectomy Right 11/18/2012    Procedure:  RIGHT MODIFIED MASTECTOMY;  Surgeon: [name], MD;  Location: [facility];  Service: General;  Laterality: Right;  . Knee arthroscopy Right 11/26/2013  . Port-a-cath removal Left 12/10/2013    Procedure: MINOR REMOVAL PORT-A-CATH;  Surgeon: [name], MD;  Location: [facility];  Service: General;  Laterality: Left;    FAMILY HISTORY Family History  Problem Relation Age of Onset  . Lung cancer Maternal Grandfather 72    smoker  . Prostate cancer Maternal Grandfather 90  . Throat cancer Other     Great Aunt x 2  . Liver cancer Other     Maternal Great Grandmother  . Melanoma Maternal Uncle 81  . Brain cancer Cousin 11    non-malignant  . Allergic rhinitis Neg Hx   . Asthma Neg Hx   . Eczema Neg Hx   . Urticaria Neg Hx   the patient's parents are living, in their early 77s. The patient has 2 brothers, no sisters. The  patient's mother was diagnosed with breast cancer, HER-2 positive, in 2014 in [place].  GYNECOLOGIC HISTORY:   (Updated 10/03/2013) Menarche age 5, first live birth age 24. She is GX P4. LMP January 2014. 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  The children are currently ages 38, 38, 52, and 33. Her husband, [name], is a Customer service manager.He just started a job for [employer].  They attend the [employer] DIRECTIVES: The patient's husband is her HCPOA   HEALTH MAINTENANCE:  (Updated 10/03/2013) Social History  Substance Use Topics  . Smoking status: Never Smoker   . Smokeless tobacco: Never Used  . Alcohol Use: No     Colonoscopy: Never  PAP: November 2013/Dr. Smith  Bone density: January 2015, [facility], normal  Lipid panel:  Not on file  Allergies  Allergen Reactions  . Almond Meal Rash  . Almond Oil Rash  . Other Rash    LOTIONS-unknown type  . Tegaderm Ag Mesh [Silver] Rash  . Vancomycin Rash    Red  Man Syndrome    Current Outpatient Prescriptions  Medication Sig Dispense Refill  . cholecalciferol (VITAMIN D) 1000 UNITS tablet Take 2,000 Units by mouth 2 (two) times daily.     . ciprofloxacin (CIPRO) 500 MG tablet Take 1 tablet (500 mg total) by mouth 2 (two) times daily. 14 tablet 2  . dextromethorphan-guaiFENesin (MUCINEX DM) 30-600 MG 12hr tablet Take 1 tablet by mouth 2 (two) times daily as needed for cough.    . levothyroxine (SYNTHROID, LEVOTHROID) 175 MCG tablet Take 175 mcg by mouth daily before breakfast.    . loratadine-pseudoephedrine (CLARITIN-D 12-HOUR) 5-120 MG tablet Take 1 tablet by mouth 2 (two) times daily.    [name] LORazepam (ATIVAN) 0.5 MG tablet Take 1 tablet (0.5 mg total) by mouth at bedtime as needed (Nausea or vomiting). 60 tablet 2  . metroNIDAZOLE (FLAGYL) 500 MG tablet Take 1 tablet (500 mg total) by mouth every 8 (eight) hours.  90 tablet 2  . naproxen sodium (ALEVE) 220 MG tablet Take 440 mg by mouth 2 (two) times daily with a meal. Reported on 05/07/2015    . pantoprazole (PROTONIX) 40 MG tablet Take 1 tablet (40 mg total) by mouth daily at 12 noon. (Patient not taking: Reported on 05/07/2015) 30 tablet 2  . saccharomyces boulardii (FLORASTOR) 250 MG capsule Take 1 capsule (250 mg total) by mouth 2 (two) times daily.     No current facility-administered medications for this visit.      OBJECTIVE: Middle-aged white woman wearing oxygen through nasal cannula; patient was unable to lie down for the exam, which was carried out with the patient sitting up  Filed Vitals:   05/11/15 0906  BP: 134/88  Pulse: 97  Temp: 97.9 F (36.6 C)  Resp: 18     Body mass index is 26.05 kg/(m^2).    ECOG FS:2 - Symptomatic, <50% confined to bed [facility] Weights   05/11/15 0906  Weight: 168 lb 14.4 oz (76.613 kg)    Sclerae unicteric, pupils round and equal Oropharynx clear and moist-- no thrush noted No cervical or supraclavicular adenopathy Lungs no rales or rhonchi, or dullness to percussion at both bases Heart regular rate and rhythm Abd soft, nontender, positive bowel sounds MSK no focal spinal tenderness, minimal bilateral ankle edema, chronic right upper extremity lymphedema, grade 1 Neuro: nonfocal, well oriented, appropriate affect Breasts: Deferred      LAB RESULTS:   Lab Results  Component Value Date   WBC 3.7* 05/11/2015   NEUTROABS 3.2 05/11/2015   HGB 11.2* 05/11/2015   HCT 34.9 05/11/2015   MCV 97.3 05/11/2015   PLT 270 05/11/2015      Chemistry      Component Value Date/Time   NA 140 04/28/2015 1050   NA 137 04/21/2015 0546   K 3.6 04/28/2015 1050   K 3.5 04/21/2015 0546   CL 101 04/21/2015 0546   CL 101 10/18/2012 0859   CO2 31* 04/28/2015 1050   CO2 28 04/21/2015 0546   BUN 7.0 04/28/2015 1050   BUN 10 04/21/2015 0546   CREATININE 0.6 04/28/2015 1050   CREATININE 0.48 04/21/2015 0546       Component Value Date/Time   CALCIUM 9.0 04/28/2015 1050   CALCIUM 8.3* 04/21/2015 0546   ALKPHOS 79 04/28/2015 1050   ALKPHOS 94 04/21/2015 0546   AST 17 04/28/2015 1050   AST 17 04/21/2015 0546   ALT <9 04/28/2015 1050   ALT 10* 04/21/2015 0546   BILITOT 0.53 04/28/2015 1050  BILITOT 0.6 04/21/2015 0546       STUDIES: Dg Chest 1 View  04/30/2015  CLINICAL DATA:  Post right thoracentesis EXAM: CHEST 1 VIEW COMPARISON:  04/30/2015 FINDINGS: Decreased right pleural effusion following thoracentesis. Loculated subpulmonic pneumothorax in the right lung base has developed since the earlier study today. There remains compressive atelectasis in much of the right lower lobe and a portion of the right upper lobe Small left effusion and left lower lobe airspace disease. Port-A-Cath tip SVC IMPRESSION: Loculated subpulmonic pneumothorax right lung base following thoracentesis. Electronically Signed   By: Franchot Gallo M.D.   On: 04/30/2015 12:48   Dg Chest 2 View  04/30/2015  CLINICAL DATA:  Metastatic breast cancer.  Evaluate effusion EXAM: CHEST  2 VIEW COMPARISON:  04/12/2015 FINDINGS: Large right pleural effusion shows mild interval enlargement. There is compressive atelectasis in the right lower lobe as well as some atelectasis in the right upper lobe which has progressed. Small left effusion has enlarged since the  prior study. Port-A-Cath tip in the SVC.  No significant vascular congestion. IMPRESSION: Progression of bilateral pleural effusions, large on the right and small on the left. Electronically Signed   By: Franchot Gallo M.D.   On: 04/30/2015 10:29   Dg Chest 2 View  04/12/2015  CLINICAL DATA:  Worsening shortness of breath, history of right breast carcinoma with bone metastasis EXAM: CHEST  2 VIEW COMPARISON:  Chest x-ray of 03/30/2015 FINDINGS: There is little change in the volume of the moderate sized right pleural effusion with volume loss at the right lung base. The left  lung is clear. A tiny left pleural effusion is difficult to exclude. Mediastinal and hilar contours are unchanged and the heart is within normal limits in size. A left-sided Port-A-Cath is present with the tip seen to the mid SVC. No acute bony abnormality is seen. IMPRESSION: Little change in moderate size right pleural effusion with volume loss at the right lung base. Cannot exclude a tiny left pleural effusion. Electronically Signed   By: Ivar Drape M.D.   On: 04/12/2015 10:47   Dg Abd 1 View  04/21/2015  CLINICAL DATA:  Pneumoperitoneum. EXAM: ABDOMEN - 1 VIEW COMPARISON:  CT scan of April 19, 2015. FINDINGS: No abnormal bowel dilatation is noted. Bilateral pleural effusions are noted, with right much larger than left. No definite pneumoperitoneum is seen on this study. Probable pneumatosis of the cecum is noted as described on prior CT scan. No abnormal calcifications are noted IMPRESSION: No evidence of bowel obstruction or ileus. Bilateral pleural effusions are noted, with right much larger than left. Probable pneumatosis of the cecum is noted as described on prior CT scan. No definite free air is noted on this exam. Electronically Signed   By: Marijo Conception, M.D.   On: 04/21/2015 09:15   Ct Chest W Contrast  04/19/2015  CLINICAL DATA:  49 year old female with history of right-sided breast cancer diagnosed in 2013 with known bone metastasis. Status post radiation therapy and chemotherapy, now complete. Chemotherapy scheduled to begin again on 04/20/2015. Status post mastectomy. Cough and shortness of breath for the past 3-4 months. EXAM: CT CHEST WITH CONTRAST TECHNIQUE: Multidetector CT imaging of the chest was performed during intravenous contrast administration. CONTRAST:  45m OMNIPAQUE IOHEXOL 300 MG/ML  SOLN COMPARISON:  PET-CT 03/17/2015.  Chest CT 03/03/2015. FINDINGS: Mediastinum/Lymph Nodes: Heart size is normal. Small amount of pericardial fluid and/or thickening, similar to the prior  study and unlikely to be of hemodynamic  significance at this time. Worsening multifocal lymphadenopathy throughout the mediastinum, most evident in the anterior mediastinum where multiple borderline enlarged and mildly enlarged lymph nodes are noted measuring up to 1 cm. Subcarinal adenopathy also increased measuring up to 1 cm in short axis. Bulky lymphatic tissue at the right hilum, difficult to discretely measure, but measuring up to 12 mm in thickness. Esophagus is unremarkable in appearance. No axillary lymphadenopathy. No internal mammary lymphadenopathy. Left-sided internal jugular single-lumen porta cath with tip terminating in the distal superior vena cava. Lungs/Pleura: The previous described right middle lobe lesion is difficult to visualize on today's examination, but appears significantly increased in size, currently measuring approximately 5.9 x 3.8 cm (image 33 of series 2), currently occupying nearly the entire right middle lobe (with remaining portions atelectatic). Marked interval enlargement of a malignant right pleural effusion, with increasing areas of pleural thickening and enhancing pleural nodularity throughout the right hemithorax, most evident at the base of the right hemithorax where the largest pleural-based mass measures 1.8 x 4.3 cm (image 59 of series 2). Extensive passive atelectasis in portions of the right lower lobe and right upper lobe. Throughout the aerated portions of the right upper and right lower lobes there is extensive septal thickening, somewhat nodular in appearance, concerning for worsening lymphangitic spread of tumor. There is also small left-sided pleural effusion which layers dependently and is simple in appearance. This is associated with some passive subsegmental atelectasis in the left lower lobe. Upper Abdomen: Multiple ill-defined hypovascular areas in the liver are new compared to prior studies, and appear concerning for probable widespread metastatic disease  to the liver, the largest of which is estimated to measure approximately 1.7 x 1.2 cm in segment 2 of the liver (image 55 of series 2). New right adrenal mass measuring 3.1 x 1.5 cm, presumably a metastatic lesion. Incompletely visualize soft tissue near the hepatic hilum immediately inferior to the portal vein measuring at least 1.5 x 2.8 cm (image 67 of series 2), likely to represent a metastatic hepatoduodenal ligament lymph node. Multiple borderline enlarged and mildly enlarged gastrohepatic lymph nodes measuring up to 1 cm in short axis. Multiple borderline enlarged celiac axis lymph nodes. Pneumoperitoneum visualized in the upper abdomen. Musculoskeletal/Soft Tissues: Ill-defined areas of sclerosis are noted in the manubrium and superior aspect of the sternum, similar to prior studies (nonspecific). Compression fracture of superior endplate of T9 is similar to the prior study, with approximately 25% loss of central vertebral body height on the left side. IMPRESSION: 1. Pneumoperitoneum. Correlation with CT of the abdomen and pelvis is suggested to identify potential source for this unexpected finding if clinically appropriate. 2. Marked progression of disease as evidenced by enlarging right middle lobe mass, markedly enlarged malignant right pleural effusion, and evidence of lymphangitic spread of disease throughout the right lung, with increasing right hilar and mediastinal lymphadenopathy, as discussed above. In addition, there are likely multifocal new hepatic metastases, a right adrenal metastasis, and multiple upper abdominal lymph nodes, concerning for metastatic disease. 3. New small left pleural effusion with some passive subsegmental atelectasis in the left lower lobe. 4. Additional incidental findings, as above. Critical Value/emergent results were called by telephone at the time of interpretation on 04/19/2015 at 9:52 am to Dr. Lurline Del, who verbally acknowledged these results. Electronically  Signed   By: Vinnie Langton M.D.   On: 04/19/2015 09:53   Ct Abdomen Pelvis W Contrast  04/19/2015  CLINICAL DATA:  Patient with history of metastatic breast cancer. On  restaging chest CT earlier today there was evidence of pneumoperitoneum. Evaluation of the abdomen and pelvis. EXAM: CT ABDOMEN AND PELVIS WITH CONTRAST TECHNIQUE: Multidetector CT imaging of the abdomen and pelvis was performed using the standard protocol following bolus administration of intravenous contrast. CONTRAST:  107m OMNIPAQUE IOHEXOL 300 MG/ML SOLN, 1 OMNIPAQUE IOHEXOL 300 MG/ML SOLN COMPARISON:  CT chest 04/19/2015; PET-CT 03/17/2015. FINDINGS: Lower chest: Patient had chest CT same day. See dedicated chest CT dictation of chest findings including right middle lobe mass, malignant right pleural effusion, lymphangitic spread of disease throughout the right lung and increasing adenopathy within the right hilum. Small left pleural effusion. Hepatobiliary: Interval development of innumerable low-attenuation lesions throughout the liver as well as interval increase in size of lesion within the central aspect of the liver measuring 1.4 x 2.1 cm (image 35; series 2), previously 1.3 x 1.1 cm. Reference lesion within the right hepatic lobe measures 1.5 x 1.1 cm (image 44; series 2). Reference lesion within the left hepatic lobe measures 1.0 x 1.0 cm (image 33; series 2). Pancreas: Unremarkable Spleen: Unremarkable Adrenals/Urinary Tract: Interval increase in size of right adrenal metastasis measuring 3.2 cm, previously 1.4 cm (image 38; series 2). Left adrenal gland is unremarkable. Kidneys enhance symmetrically with contrast. No hydronephrosis. Urinary bladder is unremarkable. Stomach/Bowel: Small amount of fluid in the pelvis. Interval development of pneumatosis involving the cecum, ascending and proximal transverse colon. There is a small amount of free intraperitoneal air. Oral contrast material is demonstrated to the level of the  descending colon. No evidence for bowel obstruction. Vascular/Lymphatic: Unchanged retrocrural adenopathy measuring up to 1.3 cm (image 33; series 2), previously 1.0 cm. Normal caliber abdominal aorta. Other: Uterus and adnexal structures are unremarkable. Musculoskeletal: Compression fracture of the superior endplate of the T9 vertebral body, similar prior exam. IMPRESSION: Free intraperitoneal air is demonstrated. There is marked pneumatosis of the cecum, ascending and proximal transverse colon. Interval progression of disease with innumerable hepatic metastasis and enlarged right adrenal metastasis. Progression of metastatic disease within the chest as described on chest CT earlier same day. Critical Value/emergent results were called by telephone at the time of interpretation on 04/19/2015 at 6:43 pm to Dr. GLurline Del, who verbally acknowledged these results. Electronically Signed   By: DLovey NewcomerM.D.   On: 04/19/2015 18:47   [place]orea[place]oracentesis Asp Pleural Space W/img Guide  04/30/2015  INDICATION: Symptomatic right sided pleural effusion EXAM: [place]oreaTHORACENTESIS ASP PLEURAL SPACE W/IMG GUIDE COMPARISON:  CXR 04/30/15. MEDICATIONS: None COMPLICATIONS: None immediate TECHNIQUE: Informed written consent was obtained from the patient after a discussion of the risks, benefits and alternatives to treatment. A timeout was performed prior to the initiation of the procedure. Initial ultrasound scanning demonstrates a right pleural effusion. The lower chest was prepped and draped in the usual sterile fashion. 1% lidocaine was used for local anesthesia. Under direct ultrasound guidance, a 19 gauge, 7-cm, Yueh catheter was introduced. An ultrasound image was saved for documentation purposes. The thoracentesis was performed. The catheter was removed and a dressing was applied. The patient tolerated the procedure well without immediate post procedural complication. The patient was escorted to have an upright chest  radiograph. FINDINGS: A total of approximately 1.3 liters of yellow/amber colored fluid was removed. Post procedure CXR revealed loculated subpulmonic pneumothorax right lung base following thoracentesis. Imaging and case discussed with Dr. WEarleen Newportwho feels this is Ex Vacuo in nature. The patient does complain of some pressure on her right side more so with  deep inspiration. She denies any worsening shortness of breath, lightheadedness, dizziness, or nausea. Her vitals signs and oxygen are stable. Dr. Earleen Newport recommends observation for 1 hour with vital signs, he does not feel a repeat CXR is needed. The patient was monitored in the short stay unit and her vital signs and oxygen remained stable. Her symptoms completely resolved and she was discharged home with instructions. IMPRESSION: Successful ultrasound-guided right sided thoracentesis yielding 1.3 liters of pleural fluid. Read By:  Tsosie Billing PA-C Electronically Signed   By: Corrie Mckusick D.O.   On: 04/30/2015 15:01    ASSESSMENT: 50 y.o. BRCA negative [place] woman with triple-negative stage IV breast cancer  (1) an status post right breast upper outer quadrant and right axillary lymph node biopsy 04/04/2012, both positive for an invasive ductal carcinoma, grade 3, triple negative, with an MIB-1 of 25%  (2) Treated neoadjuvantly with  (a) fluorouracil, cyclophosphamide, and epirubicin (at 100 mg/M2) x4 completed 06/07/2012  (b) docetaxel (75 mg/M2) for one dose, 06/21/2012, poorly tolerated  (c) carboplatin and gemcitabine given every 21 days for 6 cycles completed 10/11/2012  (3) status post right modified radical mastectomy 11/18/2012 showing a complete pathologic response--all 16 lymph nodes were benign  (a) no plans for reconstructon  (4) adjuvant radiation therapy completed 03/06/2013  METASTATIC DISEASE June 2016 (5) 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, 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  (6) staging  PET scan 11/06/2014 showed significant disease in the middle and lower lobes of the Right lung, Right effusion, multiple liver and bone lesions, as well as left lung nodules, a Right adrenal nodule, and widespread hypermetabolic adenopathy  (a) brain MRI 11/16/2014 showed multiple cerebellar lesions  (7) RADIATION TREATMENTS thoracic metastases  (a) 01/28/2015-02/11/2015: Thoracic Spine T8-10, 30 Gy in 10 fractions  (7) abraxane day 1 and day 8 of each 21 day cycle started 11/09/2014: last dose 03/12/2015 (6 cycles)  (a) PET scan 01/05/2015 shows an excellent initial response after 3 cycles  (b) PET scan 03/17/2015 shows progression  (8) zolendronate started 11/16/2014, to be repeated every 12 weeks, most recent dose 02/09/2015  (9) Foundation 1 study sent  11/06/2014: patient's sister in law Garvin Fila following [phone])  (a) 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[place], NTRK1, MYC, ARID1A and LYN, none w approved therapies  (b) pazopanib, ponatinib or crizotinib suggested as possible off-protocol options  (10) RADIATION TREATMENTS: Brain metastases:  (a) s/p SRS therapy 12/11/2014 to 4 right cerebellar lesions  (b) multiple (18+) lesions noted on repeat brain MRI 03/16/2015  (c) whole brain irradiation completed 04/12/2015  (11) uncontrolled pain: Started on OxyContin 10 mg twice a day with oxycodone 5 mg as needed 03/26/2015  (12) Right pleural effusion  (a) s/p thoracentesis July 2016 (x2), 03/30/2015  (13) started eribulin11/29/2016, originally planned to be given day one and day 8 of each 21 day cycle, but switch to every 14 days with onpro support because of persistent leukopenia.  PLAN: Clinically Mauricia is worse. She looks more wasted, and is having more shortness of breath. However she has only received 1 cycle of eribulin. We are proceeding with cycle 2 today. The plan would be to continue every 2 weeks and after 4 cycles to restage.  If she does not have a response to the eribulin  we can consider switching to cisplatin. She will also be meeting with the folks at [facility] to see what other options may be available for her.  I am concerned that if she continues  to deteriorate it may not be possible to proceed with aggressive treatment. At this point I believe Lanisa is focusing more on the short-term, getting through the holidays. I brought up advanced directives at the time of the recent hospitalization and both she and her husband [name] were clear they wanted everything done. If the eribulin does not work or if her upcoming brain MRI shows recurrence we will have to reopen that difficult discussion.  She will be seen here next week chiefly to check her counts and to support her breathing. Today I added Lasix to her medications. We can consider adding steroids if her breathing further deteriorates. She will then return on January 4 for her third cycle of eribulin. I will drop in on that visit and set her up for the fourth cycle and restaging studies.  Stephanie knows to call for any problems that may develop before her next visit here.   Chauncey Cruel, MD   05/11/2015

## 2015-05-12 ENCOUNTER — Ambulatory Visit: Payer: BLUE CROSS/BLUE SHIELD

## 2015-05-12 ENCOUNTER — Telehealth: Payer: Self-pay | Admitting: Oncology

## 2015-05-12 NOTE — Telephone Encounter (Signed)
Appointments added per pof and patient will get a new schedule at [facility] today

## 2015-05-19 ENCOUNTER — Other Ambulatory Visit (HOSPITAL_BASED_OUTPATIENT_CLINIC_OR_DEPARTMENT_OTHER): Payer: BLUE CROSS/BLUE SHIELD

## 2015-05-19 ENCOUNTER — Encounter: Payer: Self-pay | Admitting: Nurse Practitioner

## 2015-05-19 ENCOUNTER — Ambulatory Visit (HOSPITAL_BASED_OUTPATIENT_CLINIC_OR_DEPARTMENT_OTHER): Payer: BLUE CROSS/BLUE SHIELD | Admitting: Nurse Practitioner

## 2015-05-19 ENCOUNTER — Telehealth: Payer: Self-pay | Admitting: Nurse Practitioner

## 2015-05-19 VITALS — BP 124/74 | HR 96 | Temp 97.9°F | Resp 18 | Wt 159.8 lb

## 2015-05-19 DIAGNOSIS — C7801 Secondary malignant neoplasm of right lung: Secondary | ICD-10-CM

## 2015-05-19 DIAGNOSIS — Z171 Estrogen receptor negative status [ER-]: Secondary | ICD-10-CM

## 2015-05-19 DIAGNOSIS — C7951 Secondary malignant neoplasm of bone: Secondary | ICD-10-CM | POA: Diagnosis not present

## 2015-05-19 DIAGNOSIS — R634 Abnormal weight loss: Secondary | ICD-10-CM

## 2015-05-19 DIAGNOSIS — R64 Cachexia: Secondary | ICD-10-CM

## 2015-05-19 DIAGNOSIS — C50411 Malignant neoplasm of upper-outer quadrant of right female breast: Secondary | ICD-10-CM

## 2015-05-19 DIAGNOSIS — C773 Secondary and unspecified malignant neoplasm of axilla and upper limb lymph nodes: Secondary | ICD-10-CM

## 2015-05-19 DIAGNOSIS — J90 Pleural effusion, not elsewhere classified: Secondary | ICD-10-CM

## 2015-05-19 DIAGNOSIS — C7802 Secondary malignant neoplasm of left lung: Secondary | ICD-10-CM

## 2015-05-19 DIAGNOSIS — R350 Frequency of micturition: Secondary | ICD-10-CM

## 2015-05-19 DIAGNOSIS — C7971 Secondary malignant neoplasm of right adrenal gland: Secondary | ICD-10-CM

## 2015-05-19 DIAGNOSIS — C787 Secondary malignant neoplasm of liver and intrahepatic bile duct: Secondary | ICD-10-CM

## 2015-05-19 DIAGNOSIS — D72819 Decreased white blood cell count, unspecified: Secondary | ICD-10-CM

## 2015-05-19 DIAGNOSIS — C7931 Secondary malignant neoplasm of brain: Secondary | ICD-10-CM

## 2015-05-19 LAB — COMPREHENSIVE METABOLIC PANEL
ALBUMIN: 2.9 g/dL — AB (ref 3.5–5.0)
ALK PHOS: 90 U/L (ref 40–150)
ALT: <9 U/L (ref 0–55)
ANION GAP: 12 meq/L — AB (ref 3–11)
AST: 17 U/L (ref 5–34)
CALCIUM: 8.9 mg/dL (ref 8.4–10.4)
CHLORIDE: 89 meq/L — AB (ref 98–109)
CO2: 39 mEq/L — ABNORMAL HIGH (ref 22–29)
Creatinine: 0.6 mg/dL (ref 0.6–1.1)
Glucose: 98 mg/dl (ref 70–140)
POTASSIUM: 4 meq/L (ref 3.5–5.1)
Sodium: 140 mEq/L (ref 136–145)
Total Bilirubin: 0.46 mg/dL (ref 0.20–1.20)
Total Protein: 6.1 g/dL — ABNORMAL LOW (ref 6.4–8.3)

## 2015-05-19 LAB — CBC WITH DIFFERENTIAL/PLATELET
BASO%: 0.2 % (ref 0.0–2.0)
Basophils Absolute: 0 10*3/uL (ref 0.0–0.1)
EOS%: 0.1 % (ref 0.0–7.0)
Eosinophils Absolute: 0 10*3/uL (ref 0.0–0.5)
HCT: 36.5 % (ref 34.8–46.6)
HGB: 11.7 g/dL (ref 11.6–15.9)
LYMPH%: 3.3 % — AB (ref 14.0–49.7)
MCH: 31.1 pg (ref 25.1–34.0)
MCHC: 32.1 g/dL (ref 31.5–36.0)
MCV: 97.1 fL (ref 79.5–101.0)
MONO#: 1 10*3/uL — ABNORMAL HIGH (ref 0.1–0.9)
MONO%: 6 % (ref 0.0–14.0)
NEUT%: 90.4 % — ABNORMAL HIGH (ref 38.4–76.8)
NEUTROS ABS: 14.5 10*3/uL — AB (ref 1.5–6.5)
Platelets: 149 10*3/uL (ref 145–400)
RBC: 3.76 10*6/uL (ref 3.70–5.45)
RDW: 18.2 % — ABNORMAL HIGH (ref 11.2–14.5)
WBC: 16 10*3/uL — AB (ref 3.9–10.3)
lymph#: 0.5 10*3/uL — ABNORMAL LOW (ref 0.9–3.3)

## 2015-05-19 LAB — URINALYSIS, MICROSCOPIC - CHCC
Bilirubin (Urine): NEGATIVE
GLUCOSE UR CHCC: NEGATIVE mg/dL
Ketones: NEGATIVE mg/dL
NITRITE: NEGATIVE
PROTEIN: NEGATIVE mg/dL
SPECIFIC GRAVITY, URINE: 1.01 (ref 1.003–1.035)
UROBILINOGEN UR: 0.2 mg/dL (ref 0.2–1)
pH: 6 (ref 4.6–8.0)

## 2015-05-19 MED ORDER — CIPROFLOXACIN HCL 500 MG PO TABS: 500.0000 mg | ORAL_TABLET | Freq: Two times a day (BID) | ORAL | Status: DC

## 2015-05-19 NOTE — Progress Notes (Signed)
And a Patient ID: Nicole Valentine, female   DOB: 1965-10-02, 49 y.o.   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 She had a mammogram performed that showed dense breasts bilaterally. Ultrasound of the right breast showed 2.3 x 1.9 cm area of abnormality with multiple abnormal lymph nodes. MRI of the bilateral breasts showed asymmetrical enhancement throughout the right breast consistent with multicentric disease. An ultrasound-guided biopsy performed. The known area of disease measured 1.9 x 1.9 x 2.3 cm. Multiple abnormal positive right axillary lymph nodes were noted. 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 Biopsy of the right axillary lymph node was positive for malignancy with extracapsular extension. Patient was originally seen by Dr. [name] Dr. [name] and Dr. [name]. She has elected to have a right mastectomy eventually and declined biopsies of any other areas within the breast."  She went on to receive neoadjuvant chemotherapy and attained a complete pathologic response, as documented below  METASTATIC DISEASE: From the prior summary note: [name] noted a very small right supraclavicular mass 09/25/2014, which she brought to our attention. She was having some allergy symptoms at the time so we decided to reevaluate this after a few weeks and on 10/22/2014 as the mass persisted we obtained a restaging neck CT scan, 10/28/2014. There was bilateral supraclavicular adenopathy, but also a large right pleural  effusion was incidentally noted. We proceeded to right thoracentesis 10/29/2014, and a liter of hazy yellow fluid was removed. Cytology from this procedure ([id number]) showed malignant cells consistent with adenocarcinoma, estrogen and progesterone receptor negative, HER-2 not amplified, with a signals ratio 1.12 and the number per cell being 2.75, and with an MIB-1 of 80%. I called Nicole Valentine with these results and set her up for a PET scan performed 11/06/2014, which shows widespread metastatic disease involving particularly the right lung, liver, and bones, but also the left lung, right adrenal, and multiple lymph node areas.  Her subsequent history is as detailed below.  INTERVAL HISTORY: [name] returns today for follow-up of her metastatic triple negative breast cancer, accompanied by her husband. Today is day 1 cycle 2 of eribulin, to be given every 14 days with onpro support.    REVIEW OF S/YSTEMS: [name] continues to lose weight at a rapid speed. She is down 9lb since her visit last week. She has not started any supplements yet. Her appetite is down secondary to taste changes. She denies mouth sores or rashes. As she has lost weight she is having soreness to her tailbone from sitting, she has a memory foam pillow she is now using and is sitting on a donut currently. She continues on 2L O2 continuously, but sometimes can get by with 1-1.5L if she is still at home. She has not noticed any more shortness of breath in the past week, but she is coughing more with occasional drainage. Mucinex and claritin D now has that under control. She sleeps sitting up to sleep for the past several weeks. Her bilateral ankles are swelling. She is taking lasix daily, with no drop in her  potassium. She has noticed for the past 2 days her urine flow has been slow with some urgency, but she has no burning or urine color changes. She denies fevers, chills ,nausa, vomiting, or changes in bowel habits. She does have  tenderness to her right side from where she last had a thoracentesis. She denies headaches, dizziness, or vision changes, but she noticed yesterday her pupils are different sizes L>R, this is apparently much less pronounced today, but still noticeable. A detailed review of systems is otherwise stable.   PAST MEDICAL HISTORY: Past Medical History  Diagnosis Date  . Complication of anesthesia     her father and uncle both had very difficult time waking up-was  something they told them may be hereditory. she will find out.  [name] Hypothyroidism   . 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 Radiation 01/21/13-03/06/13    Right Breast  . Breast cancer ([place]) dx'd 04-10-12-rt    PAST SURGICAL HISTORY: Past Surgical History  Procedure Laterality Date  . Wisdom tooth extraction    . Portacath placement  04/22/2012    Procedure: INSERTION PORT-A-CATH;  Surgeon: [name], MD;  Location: [facility];  Service: General;  Laterality: Left;  [name] Modified mastectomy Right 11/18/2012    Procedure:  RIGHT MODIFIED MASTECTOMY;  Surgeon: [name], MD;  Location: [facility];  Service: General;  Laterality: Right;  . Knee arthroscopy Right 11/26/2013  . Port-a-cath removal Left 12/10/2013    Procedure: MINOR REMOVAL PORT-A-CATH;  Surgeon: [name], MD;  Location: [facility];  Service: General;  Laterality: Left;    FAMILY HISTORY Family History  Problem Relation Age of Onset  . Lung cancer Maternal Grandfather 54    smoker  . Prostate cancer Maternal Grandfather 90  . Throat cancer Other     Great Aunt x 2  . Liver cancer Other     Maternal Great Grandmother  . Melanoma Maternal Uncle 81  . Brain cancer Cousin 11    non-malignant  . Allergic rhinitis Neg Hx   . Asthma Neg Hx   . Eczema Neg Hx   . Urticaria Neg Hx   the patient's parents are living, in their early 56s. The patient has 2 brothers, no sisters. The patient's mother was diagnosed  with breast cancer, HER-2 positive, in 2014 in [place].  GYNECOLOGIC HISTORY:   (Updated 10/03/2013) Menarche age 44, first live birth age 21. She is GX P4. LMP January 2014. 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  The children are currently ages 68, 25, 31, and 66. Her husband, [name], is a Customer service manager.He just started a job for [employer].  They attend the [place] DIRECTIVES: The patient's husband is her HCPOA   HEALTH MAINTENANCE:  (Updated 10/03/2013) Social History  Substance Use Topics  . Smoking status: Never Smoker   . Smokeless tobacco: Never Used  . Alcohol Use: No     Colonoscopy: Never  PAP: November 2013/Dr. Smith  Bone density: January 2015, [facility], normal  Lipid panel:  Not on file  Allergies  Allergen Reactions  . Almond Meal Rash  . Almond Oil Rash  . Other Rash    LOTIONS-unknown type  . Tegaderm Ag Mesh [Silver] Rash  . Vancomycin Rash    Red Man Syndrome    Current  Outpatient Prescriptions  Medication Sig Dispense Refill  . cholecalciferol (VITAMIN D) 1000 UNITS tablet Take 2,000 Units by mouth 2 (two) times daily.     [name] dextromethorphan-guaiFENesin (MUCINEX DM) 30-600 MG 12hr tablet Take 1 tablet by mouth 2 (two) times daily as needed for cough.    . furosemide (LASIX) 20 MG tablet Take 1 tablet (20 mg total) by mouth daily. 30 tablet 4  . levothyroxine (SYNTHROID, LEVOTHROID) 175 MCG tablet Take 175 mcg by mouth daily before breakfast.    . loratadine-pseudoephedrine (CLARITIN-D 12-HOUR) 5-120 MG tablet Take 1 tablet by mouth 2 (two) times daily.    [name] LORazepam (ATIVAN) 0.5 MG tablet Take 1 tablet (0.5 mg total) by mouth at bedtime as needed (Nausea or vomiting). 60 tablet 2  . metroNIDAZOLE (FLAGYL) 500 MG tablet Take 1 tablet (500 mg total) by mouth every 8 (eight) hours. (Patient taking differently: Take 500 mg by mouth  2 (two) times daily. ) 90 tablet 2  . naproxen sodium (ALEVE) 220 MG tablet Take 440 mg by mouth 2 (two) times daily with a meal. Reported on 05/07/2015    . saccharomyces boulardii (FLORASTOR) 250 MG capsule Take 1 capsule (250 mg total) by mouth 2 (two) times daily.    . pantoprazole (PROTONIX) 40 MG tablet Take 1 tablet (40 mg total) by mouth daily at 12 noon. (Patient not taking: Reported on 05/07/2015) 30 tablet 2   No current facility-administered medications for this visit.      OBJECTIVE: Middle-aged white woman wearing oxygen through nasal cannula; patient was unable to lie down for the exam, which was carried out with the patient sitting up  Filed Vitals:   05/19/15 1359  BP: 124/74  Pulse: 96  Temp: 97.9 F (36.6 C)  Resp: 18     Body mass index is 24.64 kg/(m^2).    ECOG FS:2 - Symptomatic, <50% confined to bed Filed Weights   05/19/15 1359  Weight: 159 lb 12.8 oz (72.485 kg)    Skin: warm, dry  HEENT: sclerae anicteric, conjunctivae pink, left pupil > right pupil, both reactive to light, oropharynx clear. No thrush or mucositis.  Lymph Nodes: No cervical or supraclavicular lymphadenopathy  Lungs: clear to auscultation bilaterally, decreased lung sounds to bilateral bases Heart: regular rate and rhythm  Abdomen: round, soft, non tender, positive bowel sounds  Musculoskeletal: No focal spinal tenderness, minimal bilateral ankle edema, chronic right upper extremity lymphedema, grade 1 Neuro: non focal, well oriented, positive affect  Breasts: deferred  LAB RESULTS:   Lab Results  Component Value Date   WBC 16.0* 05/19/2015   NEUTROABS 14.5* 05/19/2015   HGB 11.7 05/19/2015   HCT 36.5 05/19/2015   MCV 97.1 05/19/2015   PLT 149 05/19/2015      Chemistry      Component Value Date/Time   NA 140 05/19/2015 1300   NA 137 04/21/2015 0546   K 4.0 05/19/2015 1300   K 3.5 04/21/2015 0546   CL 101 04/21/2015 0546   CL 101 10/18/2012 0859   CO2 39* 05/19/2015 1300    CO2 28 04/21/2015 0546   BUN <4.0* 05/19/2015 1300   BUN 10 04/21/2015 0546   CREATININE 0.6 05/19/2015 1300   CREATININE 0.48 04/21/2015 0546      Component Value Date/Time   CALCIUM 8.9 05/19/2015 1300   CALCIUM 8.3* 04/21/2015 0546   ALKPHOS 90 05/19/2015 1300   ALKPHOS 94 04/21/2015 0546   AST 17 05/19/2015 1300   AST 17 04/21/2015  0546   ALT <9 05/19/2015 1300   ALT 10* 04/21/2015 0546   BILITOT 0.46 05/19/2015 1300   BILITOT 0.6 04/21/2015 0546       STUDIES: Dg Chest 1 View  04/30/2015  CLINICAL DATA:  Post right thoracentesis EXAM: CHEST 1 VIEW COMPARISON:  04/30/2015 FINDINGS: Decreased right pleural effusion following thoracentesis. Loculated subpulmonic pneumothorax in the right lung base has developed since the earlier study today. There remains compressive atelectasis in much of the right lower lobe and a portion of the right upper lobe Small left effusion and left lower lobe airspace disease. Port-A-Cath tip SVC IMPRESSION: Loculated subpulmonic pneumothorax right lung base following thoracentesis. Electronically Signed   By: Franchot Gallo M.D.   On: 04/30/2015 12:48   Dg Chest 2 View  04/30/2015  CLINICAL DATA:  Metastatic breast cancer.  Evaluate effusion EXAM: CHEST  2 VIEW COMPARISON:  04/12/2015 FINDINGS: Large right pleural effusion shows mild interval enlargement. There is compressive atelectasis in the right lower lobe as well as some atelectasis in the right upper lobe which has progressed. Small left effusion has enlarged since the  prior study. Port-A-Cath tip in the SVC.  No significant vascular congestion. IMPRESSION: Progression of bilateral pleural effusions, large on the right and small on the left. Electronically Signed   By: Franchot Gallo M.D.   On: 04/30/2015 10:29   Dg Abd 1 View  04/21/2015  CLINICAL DATA:  Pneumoperitoneum. EXAM: ABDOMEN - 1 VIEW COMPARISON:  CT scan of April 19, 2015. FINDINGS: No abnormal bowel dilatation is noted. Bilateral  pleural effusions are noted, with right much larger than left. No definite pneumoperitoneum is seen on this study. Probable pneumatosis of the cecum is noted as described on prior CT scan. No abnormal calcifications are noted IMPRESSION: No evidence of bowel obstruction or ileus. Bilateral pleural effusions are noted, with right much larger than left. Probable pneumatosis of the cecum is noted as described on prior CT scan. No definite free air is noted on this exam. Electronically Signed   By: Marijo Conception, M.D.   On: 04/21/2015 09:15   Ct Abdomen Pelvis W Contrast  04/19/2015  CLINICAL DATA:  Patient with history of metastatic breast cancer. On restaging chest CT earlier today there was evidence of pneumoperitoneum. Evaluation of the abdomen and pelvis. EXAM: CT ABDOMEN AND PELVIS WITH CONTRAST TECHNIQUE: Multidetector CT imaging of the abdomen and pelvis was performed using the standard protocol following bolus administration of intravenous contrast. CONTRAST:  9m OMNIPAQUE IOHEXOL 300 MG/ML SOLN, 1 OMNIPAQUE IOHEXOL 300 MG/ML SOLN COMPARISON:  CT chest 04/19/2015; PET-CT 03/17/2015. FINDINGS: Lower chest: Patient had chest CT same day. See dedicated chest CT dictation of chest findings including right middle lobe mass, malignant right pleural effusion, lymphangitic spread of disease throughout the right lung and increasing adenopathy within the right hilum. Small left pleural effusion. Hepatobiliary: Interval development of innumerable low-attenuation lesions throughout the liver as well as interval increase in size of lesion within the central aspect of the liver measuring 1.4 x 2.1 cm (image 35; series 2), previously 1.3 x 1.1 cm. Reference lesion within the right hepatic lobe measures 1.5 x 1.1 cm (image 44; series 2). Reference lesion within the left hepatic lobe measures 1.0 x 1.0 cm (image 33; series 2). Pancreas: Unremarkable Spleen: Unremarkable Adrenals/Urinary Tract: Interval increase in size  of right adrenal metastasis measuring 3.2 cm, previously 1.4 cm (image 38; series 2). Left adrenal gland is unremarkable. Kidneys enhance symmetrically with contrast. No  hydronephrosis. Urinary bladder is unremarkable. Stomach/Bowel: Small amount of fluid in the pelvis. Interval development of pneumatosis involving the cecum, ascending and proximal transverse colon. There is a small amount of free intraperitoneal air. Oral contrast material is demonstrated to the level of the descending colon. No evidence for bowel obstruction. Vascular/Lymphatic: Unchanged retrocrural adenopathy measuring up to 1.3 cm (image 33; series 2), previously 1.0 cm. Normal caliber abdominal aorta. Other: Uterus and adnexal structures are unremarkable. Musculoskeletal: Compression fracture of the superior endplate of the T9 vertebral body, similar prior exam. IMPRESSION: Free intraperitoneal air is demonstrated. There is marked pneumatosis of the cecum, ascending and proximal transverse colon. Interval progression of disease with innumerable hepatic metastasis and enlarged right adrenal metastasis. Progression of metastatic disease within the chest as described on chest CT earlier same day. Critical Value/emergent results were called by telephone at the time of interpretation on 04/19/2015 at 6:43 pm to Dr. Lurline Del , who verbally acknowledged these results. Electronically Signed   By: Lovey Newcomer M.D.   On: 04/19/2015 18:47   US Thoracentesis Asp Pleural Space W/img Guide  04/30/2015  INDICATION: Symptomatic right sided pleural effusion EXAM: US THORACENTESIS ASP PLEURAL SPACE W/IMG GUIDE COMPARISON:  CXR 04/30/15. MEDICATIONS: None COMPLICATIONS: None immediate TECHNIQUE: Informed written consent was obtained from the patient after a discussion of the risks, benefits and alternatives to treatment. A timeout was performed prior to the initiation of the procedure. Initial ultrasound scanning demonstrates a right pleural effusion.  The lower chest was prepped and draped in the usual sterile fashion. 1% lidocaine was used for local anesthesia. Under direct ultrasound guidance, a 19 gauge, 7-cm, Yueh catheter was introduced. An ultrasound image was saved for documentation purposes. The thoracentesis was performed. The catheter was removed and a dressing was applied. The patient tolerated the procedure well without immediate post procedural complication. The patient was escorted to have an upright chest radiograph. FINDINGS: A total of approximately 1.3 liters of yellow/amber colored fluid was removed. Post procedure CXR revealed loculated subpulmonic pneumothorax right lung base following thoracentesis. Imaging and case discussed with Dr. Earleen Newport who feels this is Ex Vacuo in nature. The patient does complain of some pressure on her right side more so with deep inspiration. She denies any worsening shortness of breath, lightheadedness, dizziness, or nausea. Her vitals signs and oxygen are stable. Dr. Earleen Newport recommends observation for 1 hour with vital signs, he does not feel a repeat CXR is needed. The patient was monitored in the short stay unit and her vital signs and oxygen remained stable. Her symptoms completely resolved and she was discharged home with instructions. IMPRESSION: Successful ultrasound-guided right sided thoracentesis yielding 1.3 liters of pleural fluid. Read By:  Tsosie Billing PA-C Electronically Signed   By: Corrie Mckusick D.O.   On: 04/30/2015 15:01    ASSESSMENT: 49 y.o. BRCA negative [place] woman with triple-negative stage IV breast cancer  (1) an status post right breast upper outer quadrant and right axillary lymph node biopsy 04/04/2012, both positive for an invasive ductal carcinoma, grade 3, triple negative, with an MIB-1 of 25%  (2) Treated neoadjuvantly with  (a) fluorouracil, cyclophosphamide, and epirubicin (at 100 mg/M2) x4 completed 06/07/2012  (b) docetaxel (75 mg/M2) for one dose, 06/21/2012,  poorly tolerated  (c) carboplatin and gemcitabine given every 21 days for 6 cycles completed 10/11/2012  (3) status post right modified radical mastectomy 11/18/2012 showing a complete pathologic response--all 16 lymph nodes were benign  (a) no plans for reconstructon  (4)  adjuvant radiation therapy completed 03/06/2013  METASTATIC DISEASE June 2016 (5) 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, 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  (6) staging PET scan 11/06/2014 showed significant disease in the middle and lower lobes of the Right lung, Right effusion, multiple liver and bone lesions, as well as left lung nodules, a Right adrenal nodule, and widespread hypermetabolic adenopathy  (a) brain MRI 11/16/2014 showed multiple cerebellar lesions  (7) RADIATION TREATMENTS thoracic metastases  (a) 01/28/2015-02/11/2015: Thoracic Spine T8-10, 30 Gy in 10 fractions  (7) abraxane day 1 and day 8 of each 21 day cycle started 11/09/2014: last dose 03/12/2015 (6 cycles)  (a) PET scan 01/05/2015 shows an excellent initial response after 3 cycles  (b) PET scan 03/17/2015 shows progression  (8) zolendronate started 11/16/2014, to be repeated every 12 weeks, most recent dose 02/09/2015  (9) Foundation 1 study sent  11/06/2014: patient's sister in law Garvin Fila following [phone])  (a) 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[place], NTRK1, MYC, ARID1A and LYN, none w approved therapies  (b) pazopanib, ponatinib or crizotinib suggested as possible off-protocol options  (10) RADIATION TREATMENTS: Brain metastases:  (a) s/p SRS therapy 12/11/2014 to 4 right cerebellar lesions  (b) multiple (18+) lesions noted on repeat brain MRI 03/16/2015  (c) whole brain irradiation completed 04/12/2015  (11) uncontrolled pain: Started on OxyContin 10 mg twice a day with oxycodone 5 mg as needed 03/26/2015  (12) Right pleural effusion  (a) s/p thoracentesis July 2016 (x2), 03/30/2015  (13) started  eribulin11/29/2016, originally planned to be given day one and day 8 of each 21 day cycle, but switch to every 14 days with onpro support because of persistent leukopenia.  PLAN: Bevelyn unfortunately continues to deteriorate. The 9lb weight loss in the past week is showing in her face now, which looks more wasted away, and she complains of more fatigue. Her breathing is at least stable on O2. She plans to start a protein supplement, but I am also placing a referral to our nutritionists office to visit with her during infusion next week. The labs were reviewed in detail and were stable. The neulasta was particularly effective, as her [place]ank is now 14.5. I am having her provide a urine sample to check for a UTI given her urinary changes.  [name] met with Dr. [name] at [facility] yesterday, but didn't learn anything new there. He stated that he will coordinate with Dr. [name] electronically, and that she need not physically return until there are changes in the treatment plan.  Camela will return in 1 week for cycle 3 of eribulin. I have added weekly appointments with Dr. Marita Kansas thereafter. After 4 cycles we will repeat restaging studies. She understands and agrees with this plan. She knows the goal of treatment in her case is control. She has been encouraged to call with any issues that might arise before her next visit here.  Laurie Panda, NP  05/19/2015

## 2015-05-19 NOTE — Telephone Encounter (Signed)
Appointments made and avs printed for patient °

## 2015-05-21 ENCOUNTER — Other Ambulatory Visit: Payer: Self-pay | Admitting: Oncology

## 2015-05-21 LAB — URINE CULTURE

## 2015-05-25 ENCOUNTER — Other Ambulatory Visit: Payer: Self-pay | Admitting: Nurse Practitioner

## 2015-05-25 ENCOUNTER — Other Ambulatory Visit: Payer: Self-pay | Admitting: Oncology

## 2015-05-26 ENCOUNTER — Telehealth: Payer: Self-pay | Admitting: Nurse Practitioner

## 2015-05-26 ENCOUNTER — Ambulatory Visit (HOSPITAL_BASED_OUTPATIENT_CLINIC_OR_DEPARTMENT_OTHER): Payer: BLUE CROSS/BLUE SHIELD

## 2015-05-26 ENCOUNTER — Ambulatory Visit (HOSPITAL_BASED_OUTPATIENT_CLINIC_OR_DEPARTMENT_OTHER): Payer: BLUE CROSS/BLUE SHIELD | Admitting: Nurse Practitioner

## 2015-05-26 ENCOUNTER — Encounter: Payer: Self-pay | Admitting: Nurse Practitioner

## 2015-05-26 ENCOUNTER — Other Ambulatory Visit (HOSPITAL_BASED_OUTPATIENT_CLINIC_OR_DEPARTMENT_OTHER): Payer: BLUE CROSS/BLUE SHIELD

## 2015-05-26 ENCOUNTER — Other Ambulatory Visit: Payer: Self-pay | Admitting: Nurse Practitioner

## 2015-05-26 VITALS — BP 117/72 | HR 110 | Temp 97.6°F | Resp 18

## 2015-05-26 VITALS — BP 117/59 | HR 109 | Temp 97.9°F | Resp 18 | Ht 67.5 in | Wt 153.5 lb

## 2015-05-26 DIAGNOSIS — C787 Secondary malignant neoplasm of liver and intrahepatic bile duct: Secondary | ICD-10-CM | POA: Diagnosis not present

## 2015-05-26 DIAGNOSIS — C7801 Secondary malignant neoplasm of right lung: Secondary | ICD-10-CM

## 2015-05-26 DIAGNOSIS — C773 Secondary and unspecified malignant neoplasm of axilla and upper limb lymph nodes: Secondary | ICD-10-CM | POA: Diagnosis not present

## 2015-05-26 DIAGNOSIS — C50411 Malignant neoplasm of upper-outer quadrant of right female breast: Secondary | ICD-10-CM

## 2015-05-26 DIAGNOSIS — C7931 Secondary malignant neoplasm of brain: Secondary | ICD-10-CM

## 2015-05-26 DIAGNOSIS — C50911 Malignant neoplasm of unspecified site of right female breast: Secondary | ICD-10-CM

## 2015-05-26 DIAGNOSIS — C7951 Secondary malignant neoplasm of bone: Secondary | ICD-10-CM

## 2015-05-26 DIAGNOSIS — R52 Pain, unspecified: Secondary | ICD-10-CM

## 2015-05-26 DIAGNOSIS — C7802 Secondary malignant neoplasm of left lung: Secondary | ICD-10-CM

## 2015-05-26 DIAGNOSIS — M25471 Effusion, right ankle: Secondary | ICD-10-CM

## 2015-05-26 DIAGNOSIS — R634 Abnormal weight loss: Secondary | ICD-10-CM

## 2015-05-26 DIAGNOSIS — M25472 Effusion, left ankle: Secondary | ICD-10-CM

## 2015-05-26 DIAGNOSIS — J90 Pleural effusion, not elsewhere classified: Secondary | ICD-10-CM

## 2015-05-26 DIAGNOSIS — C50919 Malignant neoplasm of unspecified site of unspecified female breast: Secondary | ICD-10-CM

## 2015-05-26 DIAGNOSIS — D7289 Other specified disorders of white blood cells: Secondary | ICD-10-CM

## 2015-05-26 LAB — COMPREHENSIVE METABOLIC PANEL
ALK PHOS: 100 U/L (ref 40–150)
ALT: <9 U/L (ref 0–55)
ANION GAP: 11 meq/L (ref 3–11)
AST: 15 U/L (ref 5–34)
Albumin: 2.7 g/dL — ABNORMAL LOW (ref 3.5–5.0)
BUN: 9.3 mg/dL (ref 7.0–26.0)
CO2: 40 meq/L — AB (ref 22–29)
Calcium: 8.9 mg/dL (ref 8.4–10.4)
Chloride: 87 mEq/L — ABNORMAL LOW (ref 98–109)
Creatinine: 0.5 mg/dL — ABNORMAL LOW (ref 0.6–1.1)
GLUCOSE: 97 mg/dL (ref 70–140)
POTASSIUM: 3.6 meq/L (ref 3.5–5.1)
SODIUM: 138 meq/L (ref 136–145)
Total Bilirubin: 0.49 mg/dL (ref 0.20–1.20)
Total Protein: 6.2 g/dL — ABNORMAL LOW (ref 6.4–8.3)

## 2015-05-26 LAB — CBC WITH DIFFERENTIAL/PLATELET
BASO%: 0.2 % (ref 0.0–2.0)
BASOS ABS: 0 10*3/uL (ref 0.0–0.1)
EOS%: 0.1 % (ref 0.0–7.0)
Eosinophils Absolute: 0 10*3/uL (ref 0.0–0.5)
HEMATOCRIT: 34.5 % — AB (ref 34.8–46.6)
HGB: 11.3 g/dL — ABNORMAL LOW (ref 11.6–15.9)
LYMPH#: 0.3 10*3/uL — AB (ref 0.9–3.3)
LYMPH%: 3 % — AB (ref 14.0–49.7)
MCH: 30.8 pg (ref 25.1–34.0)
MCHC: 32.8 g/dL (ref 31.5–36.0)
MCV: 93.8 fL (ref 79.5–101.0)
MONO#: 0.5 10*3/uL (ref 0.1–0.9)
MONO%: 4.9 % (ref 0.0–14.0)
NEUT#: 9.1 10*3/uL — ABNORMAL HIGH (ref 1.5–6.5)
NEUT%: 91.8 % — AB (ref 38.4–76.8)
PLATELETS: 301 10*3/uL (ref 145–400)
RBC: 3.67 10*6/uL — AB (ref 3.70–5.45)
RDW: 18.2 % — ABNORMAL HIGH (ref 11.2–14.5)
WBC: 9.9 10*3/uL (ref 3.9–10.3)

## 2015-05-26 MED ORDER — ZOLEDRONIC ACID 4 MG/100ML IV SOLN
4.0000 mg | Freq: Once | INTRAVENOUS | Status: DC
  Filled 2015-05-26: qty 100

## 2015-05-26 MED ORDER — ONDANSETRON HCL 8 MG PO TABS
8.0000 mg | ORAL_TABLET | Freq: Two times a day (BID) | ORAL | Status: DC | PRN
Start: 2015-05-26 — End: 2015-06-24

## 2015-05-26 MED ORDER — HEPARIN SOD (PORK) LOCK FLUSH 100 UNIT/ML IV SOLN
500.0000 [IU] | Freq: Once | INTRAVENOUS | Status: AC | PRN
  Administered 2015-05-26: 500 [IU]
  Filled 2015-05-26: qty 5

## 2015-05-26 MED ORDER — METOCLOPRAMIDE HCL 10 MG PO TABS: 10.0000 mg | ORAL_TABLET | Freq: Four times a day (QID) | ORAL | Status: DC | PRN

## 2015-05-26 MED ORDER — DEXAMETHASONE 4 MG PO TABS: 8.0000 mg | ORAL_TABLET | Freq: Two times a day (BID) | ORAL | Status: DC

## 2015-05-26 MED ORDER — SODIUM CHLORIDE 0.9 % IJ SOLN
10.0000 mL | INTRAMUSCULAR | Status: DC | PRN
  Administered 2015-05-26: 10 mL
  Filled 2015-05-26: qty 10

## 2015-05-26 MED ORDER — SODIUM CHLORIDE 0.9 % IV SOLN: Freq: Once | INTRAVENOUS | Status: DC

## 2015-05-26 MED ORDER — METOCLOPRAMIDE HCL 5 MG PO TABS: 5.0000 mg | ORAL_TABLET | Freq: Four times a day (QID) | ORAL | Status: DC | PRN

## 2015-05-26 MED ORDER — SODIUM CHLORIDE 0.9 % IV SOLN
INTRAVENOUS | Status: DC
  Administered 2015-05-26: 15:00:00 via INTRAVENOUS

## 2015-05-26 NOTE — Telephone Encounter (Signed)
Appointments added per pof and patient will get a new avs today and or 1/6

## 2015-05-26 NOTE — Patient Instructions (Signed)

## 2015-05-26 NOTE — Progress Notes (Signed)
And a Patient ID: Nicole Valentine, female   DOB: 04/21/1966, 50 y.o.   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 She had a mammogram performed that showed dense breasts bilaterally. Ultrasound of the right breast showed 2.3 x 1.9 cm area of abnormality with multiple abnormal lymph nodes. MRI of the bilateral breasts showed asymmetrical enhancement throughout the right breast consistent with multicentric disease. An ultrasound-guided biopsy performed. The known area of disease measured 1.9 x 1.9 x 2.3 cm. Multiple abnormal positive right axillary lymph nodes were noted. 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 Biopsy of the right axillary lymph node was positive for malignancy with extracapsular extension. Patient was originally seen by Dr. [name] Dr. [name] and Dr. [name]. She has elected to have a right mastectomy eventually and declined biopsies of any other areas within the breast."  She went on to receive neoadjuvant chemotherapy and attained a complete pathologic response, as documented below  METASTATIC DISEASE: From the prior summary note: Nicole Valentine noted a very small right supraclavicular mass 09/25/2014, which she brought to our attention. She was having some allergy symptoms at the time so we decided to reevaluate this after a few weeks and on 10/22/2014 as the mass persisted we obtained a restaging neck CT scan, 10/28/2014. There was bilateral supraclavicular adenopathy, but also a large right pleural  effusion was incidentally noted. We proceeded to right thoracentesis 10/29/2014, and a liter of hazy yellow fluid was removed. Cytology from this procedure ([id number]) showed malignant cells consistent with adenocarcinoma, estrogen and progesterone receptor negative, HER-2 not amplified, with a signals ratio 1.12 and the number per cell being 2.75, and with an MIB-1 of 80%. I called Nicole Valentine with these results and set her up for a PET scan performed 11/06/2014, which shows widespread metastatic disease involving particularly the right lung, liver, and bones, but also the left lung, right adrenal, and multiple lymph node areas.  Her subsequent history is as detailed below.  INTERVAL HISTORY: Nicole Valentine returns today for follow-up of her metastatic triple negative breast cancer, alone. Normally she would be receiving the next dose of eribulin today, but we are making changes to her treatment. She is here to discuss starting cisplain every 3 weeks instead.    REVIEW OF S/YSTEMS: Nicole Valentine has lost another 6lb since last week. Her appetite is no better. She has not started a supplement yet because she is waiting to meet with our nutritionist. She feels dehydrated. She denies fevers, chills, nausea, or vomiting. She is moving her bowels well. Her urinary habits are back to normal, with no burning or urgency. She continues on 1-2LO2 continuously. Mucinex and claritin D manages her cough. She is short of breath quickly with exertion, but denies chest pain, or palpitations. She now has a recliner that she sleeps in at home. Her bilateral ankles swell. She is on lasix daily. She denies headaches, dizziness, or vision changes. A detailed review of systems is otherwise stable.  PAST MEDICAL HISTORY: Past Medical History  Diagnosis Date  . Complication of anesthesia  her father and uncle both had very difficult time waking up-was  something they told them may be hereditory. she will find out.  [name] Hypothyroidism    . 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 Radiation 01/21/13-03/06/13    Right Breast  . Breast cancer (Nicole Valentine) dx'd 04-10-12-rt    PAST SURGICAL HISTORY: Past Surgical History  Procedure Laterality Date  . Wisdom tooth extraction    . Portacath placement  04/22/2012    Procedure: INSERTION PORT-A-CATH;  Surgeon: [name], MD;  Location: [facility];  Service: General;  Laterality: Left;  [name] Modified mastectomy Right 11/18/2012    Procedure:  RIGHT MODIFIED MASTECTOMY;  Surgeon: [name], MD;  Location: [facility];  Service: General;  Laterality: Right;  . Knee arthroscopy Right 11/26/2013  . Port-a-cath removal Left 12/10/2013    Procedure: MINOR REMOVAL PORT-A-CATH;  Surgeon: [name], MD;  Location: [facility];  Service: General;  Laterality: Left;    FAMILY HISTORY Family History  Problem Relation Age of Onset  . Lung cancer Maternal Grandfather 42    smoker  . Prostate cancer Maternal Grandfather 90  . Throat cancer Other     Great Aunt x 2  . Liver cancer Other     Maternal Great Grandmother  . Melanoma Maternal Uncle 81  . Brain cancer Cousin 11    non-malignant  . Allergic rhinitis Neg Hx   . Asthma Neg Hx   . Eczema Neg Hx   . Urticaria Neg Hx   the patient's parents are living, in their early 74s. The patient has 2 brothers, no sisters. The patient's mother was diagnosed with breast cancer, HER-2 positive, in 2014 in [place].  GYNECOLOGIC HISTORY:   (Updated 10/03/2013) Menarche age 24, first live birth age 78. She is GX P4. LMP January 2014. 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  The children are currently ages 32, 73, 33, and 34. Her husband, [name], is a Customer service manager.He just started a job for [employer].  They attend the [employer] DIRECTIVES: The patient's husband is her  HCPOA   HEALTH MAINTENANCE:  (Updated 10/03/2013) Social History  Substance Use Topics  . Smoking status: Never Smoker   . Smokeless tobacco: Never Used  . Alcohol Use: No     Colonoscopy: Never  PAP: November 2013/Dr. Smith  Bone density: January 2015, [facility], normal  Lipid panel:  Not on file  Allergies  Allergen Reactions  . Almond Meal Rash  . Almond Oil Rash  . Other Rash    LOTIONS-unknown type  . Tegaderm Ag Mesh [Silver] Rash  . Vancomycin Rash    Red Man Syndrome    Current Outpatient Prescriptions  Medication Sig Dispense Refill  . cholecalciferol (VITAMIN D) 1000 UNITS tablet Take 2,000 Units by mouth 2 (two) times daily.     . ciprofloxacin (CIPRO) 500 MG tablet Take 1 tablet (500 mg total) by mouth 2 (two) times daily. 10 tablet 0  . dextromethorphan-guaiFENesin (MUCINEX DM) 30-600 MG 12hr tablet Take 1 tablet by mouth 2 (two) times daily as needed for cough.    . furosemide (LASIX) 20 MG tablet Take 1 tablet (20 mg total) by mouth daily. 30 tablet 4  . levothyroxine (SYNTHROID, LEVOTHROID) 175 MCG tablet Take 175 mcg by mouth daily before breakfast.    .  loratadine-pseudoephedrine (CLARITIN-D 12-HOUR) 5-120 MG tablet Take 1 tablet by mouth 2 (two) times daily.    [name] LORazepam (ATIVAN) 0.5 MG tablet Take 1 tablet (0.5 mg total) by mouth at bedtime as needed (Nausea or vomiting). 60 tablet 2  . naproxen sodium (ALEVE) 220 MG tablet Take 440 mg by mouth 2 (two) times daily with a meal. Reported on 05/07/2015    . pantoprazole (PROTONIX) 40 MG tablet Take 1 tablet (40 mg total) by mouth daily at 12 noon. 30 tablet 2  . saccharomyces boulardii (FLORASTOR) 250 MG capsule Take 1 capsule (250 mg total) by mouth 2 (two) times daily.    [name] dexamethasone (DECADRON) 4 MG tablet Take 2 tablets (8 mg total) by mouth 2 (two) times daily with a meal. Starting on day 2 of treatment. Take for 3 days, then stop. 30 tablet 1  . metoCLOPramide (REGLAN) 10 MG tablet Take 1 tablet (10 mg  total) by mouth every 6 (six) hours as needed for nausea. 30 tablet 2  . ondansetron (ZOFRAN) 8 MG tablet Take 1 tablet (8 mg total) by mouth 2 (two) times daily as needed for nausea or vomiting. Start on day 3 of treatment if necessary 30 tablet 2   No current facility-administered medications for this visit.      OBJECTIVE: Middle-aged white woman wearing oxygen through nasal cannula;   Filed Vitals:   05/26/15 1310  BP: 117/59  Pulse: 109  Temp: 97.9 F (36.6 C)  Resp: 18     Body mass index is 23.67 kg/(m^2).    ECOG FS:2 - Symptomatic, <50% confined to bed Filed Weights   05/26/15 1310  Weight: 153 lb 8 oz (69.627 kg)    Skin: warm, dry  HEENT: sclerae anicteric, conjunctivae pink, left pupil > right pupil, both reactive to light, oropharynx clear. No thrush or mucositis.  Lymph Nodes: No cervical or supraclavicular lymphadenopathy  Lungs: clear to auscultation on left side, decreased lung sounds to entire right field, mild cough Heart: regular rate and rhythm  Abdomen: round, soft, non tender, positive bowel sounds  Musculoskeletal: No focal spinal tenderness, minimal bilateral ankle edema, chronic right upper extremity lymphedema, grade 1 Neuro: non focal, well oriented, positive affect  Breasts: deferred  LAB RESULTS:   Lab Results  Component Value Date   WBC 9.9 05/26/2015   NEUTROABS 9.1* 05/26/2015   HGB 11.3* 05/26/2015   HCT 34.5* 05/26/2015   MCV 93.8 05/26/2015   PLT 301 05/26/2015      Chemistry      Component Value Date/Time   NA 138 05/26/2015 1259   NA 137 04/21/2015 0546   K 3.6 05/26/2015 1259   K 3.5 04/21/2015 0546   CL 101 04/21/2015 0546   CL 101 10/18/2012 0859   CO2 40* 05/26/2015 1259   CO2 28 04/21/2015 0546   BUN 9.3 05/26/2015 1259   BUN 10 04/21/2015 0546   CREATININE 0.5* 05/26/2015 1259   CREATININE 0.48 04/21/2015 0546      Component Value Date/Time   CALCIUM 8.9 05/26/2015 1259   CALCIUM 8.3* 04/21/2015 0546   ALKPHOS  100 05/26/2015 1259   ALKPHOS 94 04/21/2015 0546   AST 15 05/26/2015 1259   AST 17 04/21/2015 0546   ALT <9 05/26/2015 1259   ALT 10* 04/21/2015 0546   BILITOT 0.49 05/26/2015 1259   BILITOT 0.6 04/21/2015 0546       STUDIES: Dg Chest 1 View  04/30/2015  CLINICAL DATA:  Post right  thoracentesis EXAM: CHEST 1 VIEW COMPARISON:  04/30/2015 FINDINGS: Decreased right pleural effusion following thoracentesis. Loculated subpulmonic pneumothorax in the right lung base has developed since the earlier study today. There remains compressive atelectasis in much of the right lower lobe and a portion of the right upper lobe Small left effusion and left lower lobe airspace disease. Port-A-Cath tip SVC IMPRESSION: Loculated subpulmonic pneumothorax right lung base following thoracentesis. Electronically Signed   By: Franchot Gallo M.D.   On: 04/30/2015 12:48   Dg Chest 2 View  04/30/2015  CLINICAL DATA:  Metastatic breast cancer.  Evaluate effusion EXAM: CHEST  2 VIEW COMPARISON:  04/12/2015 FINDINGS: Large right pleural effusion shows mild interval enlargement. There is compressive atelectasis in the right lower lobe as well as some atelectasis in the right upper lobe which has progressed. Small left effusion has enlarged since the  prior study. Port-A-Cath tip in the SVC.  No significant vascular congestion. IMPRESSION: Progression of bilateral pleural effusions, large on the right and small on the left. Electronically Signed   By: Franchot Gallo M.D.   On: 04/30/2015 10:29   US Thoracentesis Asp Pleural Space W/img Guide  04/30/2015  INDICATION: Symptomatic right sided pleural effusion EXAM: US THORACENTESIS ASP PLEURAL SPACE W/IMG GUIDE COMPARISON:  CXR 04/30/15. MEDICATIONS: None COMPLICATIONS: None immediate TECHNIQUE: Informed written consent was obtained from the patient after a discussion of the risks, benefits and alternatives to treatment. A timeout was performed prior to the initiation of the  procedure. Initial ultrasound scanning demonstrates a right pleural effusion. The lower chest was prepped and draped in the usual sterile fashion. 1% lidocaine was used for local anesthesia. Under direct ultrasound guidance, a 19 gauge, 7-cm, Yueh catheter was introduced. An ultrasound image was saved for documentation purposes. The thoracentesis was performed. The catheter was removed and a dressing was applied. The patient tolerated the procedure well without immediate post procedural complication. The patient was escorted to have an upright chest radiograph. FINDINGS: A total of approximately 1.3 liters of yellow/amber colored fluid was removed. Post procedure CXR revealed loculated subpulmonic pneumothorax right lung base following thoracentesis. Imaging and case discussed with Dr. Earleen Newport who feels this is Ex Vacuo in nature. The patient does complain of some pressure on her right side more so with deep inspiration. She denies any worsening shortness of breath, lightheadedness, dizziness, or nausea. Her vitals signs and oxygen are stable. Dr. Earleen Newport recommends observation for 1 hour with vital signs, he does not feel a repeat CXR is needed. The patient was monitored in the short stay unit and her vital signs and oxygen remained stable. Her symptoms completely resolved and she was discharged home with instructions. IMPRESSION: Successful ultrasound-guided right sided thoracentesis yielding 1.3 liters of pleural fluid. Read By:  Tsosie Billing PA-C Electronically Signed   By: Corrie Mckusick D.O.   On: 04/30/2015 15:01    ASSESSMENT: 50 y.o. BRCA negative [place] woman with triple-negative stage IV breast cancer  (1) an status post right breast upper outer quadrant and right axillary lymph node biopsy 04/04/2012, both positive for an invasive ductal carcinoma, grade 3, triple negative, with an MIB-1 of 25%  (2) Treated neoadjuvantly with  (a) fluorouracil, cyclophosphamide, and epirubicin (at 100 mg/M2) x4  completed 06/07/2012  (b) docetaxel (75 mg/M2) for one dose, 06/21/2012, poorly tolerated  (c) carboplatin and gemcitabine given every 21 days for 6 cycles completed 10/11/2012  (3) status post right modified radical mastectomy 11/18/2012 showing a complete pathologic response--all 16 lymph nodes  were benign  (a) no plans for reconstructon  (4) adjuvant radiation therapy completed 03/06/2013  METASTATIC DISEASE June 2016 (5) 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, 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  (6) staging PET scan 11/06/2014 showed significant disease in the middle and lower lobes of the Right lung, Right effusion, multiple liver and bone lesions, as well as left lung nodules, a Right adrenal nodule, and widespread hypermetabolic adenopathy  (a) brain MRI 11/16/2014 showed multiple cerebellar lesions  (7) RADIATION TREATMENTS thoracic metastases  (a) 01/28/2015-02/11/2015: Thoracic Spine T8-10, 30 Gy in 10 fractions  (7) abraxane day 1 and day 8 of each 21 day cycle started 11/09/2014: last dose 03/12/2015 (6 cycles)  (a) PET scan 01/05/2015 shows an excellent initial response after 3 cycles  (b) PET scan 03/17/2015 shows progression  (8) zolendronate started 11/16/2014, to be repeated every 12 weeks, most recent dose 02/09/2015  (9) Foundation 1 study sent  11/06/2014: patient's sister in law Garvin Fila following [phone])  (a) 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[place], NTRK1, MYC, ARID1A and LYN, none w approved therapies  (b) pazopanib, ponatinib or crizotinib suggested as possible off-protocol options  (10) RADIATION TREATMENTS: Brain metastases:  (a) s/p SRS therapy 12/11/2014 to 4 right cerebellar lesions  (b) multiple (18+) lesions noted on repeat brain MRI 03/16/2015  (c) whole brain irradiation completed 04/12/2015  (11) uncontrolled pain: Started on OxyContin 10 mg twice a day with oxycodone 5 mg as needed 03/26/2015  (12) Right pleural  effusion  (a) s/p thoracentesis July 2016 (x2), 03/30/2015  (13) started eribulin11/29/2016, originally planned to be given day one and day 8 of each 21 day cycle, but switch to every 14 days with onpro support because of persistent leukopenia. Discontinued 05/25/14  (14) cisplatin to begin 05/27/14  PLAN: Given Nicole Valentine's continued decline, Dr. [name] decided a change in treatment strategies is necessary. We are not convinced that the eribulin is effective against her metastatic disease. Our next strategy is to switch to cisplatin, given every 3 weeks. The patient understands that while her counts will not drop as dramatically as it has with past therapies, cisplatin runs the increased risk of kidney damage. She understands that she will need to hydrate aggressively, and that IV fluids will also be administered on the day of treatment. Cisplatin is also more nausea inducing that other chemotherapies. We have reviewed her antiemetic schedule, and this will consist of dexamethasone 62m BID x 3 days, zofran 891mBID as needed starting after day 2, and reglan 73m42m6hrs PRN. Cisplatin has been improved by her insurance and she will begin this on Friday. We will prehydrate her with 1L today, and again with 1L on Saturday.  She will need a new baseline study, so I have ordered a chest CT without contrast to avoid unnecessary stress on the kidneys.   JenMeyerll return in 1 week for follow up with Dr. MagDoris Cheadlehe understands and agrees with this plan. She knows the goal of treatment in her case is control. She has been encouraged to call with any issues that might arise before her next visit here.   HeaLaurie PandaP  05/26/2015    ADDENDUM: I read Nicole Valentine's note while I was out of town and decided we should consider switchingJennifer to cis-platinum, which I think has the highest chance of giving her a meaningful response. I went ahead and changed the orders prior to the visit so we could get the  drug approved. It is reassuring to  me that she had independently come to the same conclusion--clinically no improvement w the eribulin after 2 doses, time to change.  We discussed the possible side-effects, toxicities and complications od CDDP indetail and she agree to proceed.   We have also scheduled her for IVF days 2 and 4. Also since this is a change in treatment we re checking a repeat CT chest--avoiding conrast for now until we can ascertin how she will do with the CDDP which can be nephrotoxic  I personally saw this patient and performed a substantive portion of this encounter with the listed APP documented above.   Chauncey Cruel, MD Medical Oncology and Hematology [facility] [address] [place], [place] [address] Tel. [phone]    Fax. [phone]

## 2015-05-28 ENCOUNTER — Ambulatory Visit (HOSPITAL_BASED_OUTPATIENT_CLINIC_OR_DEPARTMENT_OTHER): Payer: BLUE CROSS/BLUE SHIELD

## 2015-05-28 ENCOUNTER — Other Ambulatory Visit: Payer: Self-pay | Admitting: *Deleted

## 2015-05-28 ENCOUNTER — Other Ambulatory Visit: Payer: Self-pay | Admitting: Nurse Practitioner

## 2015-05-28 VITALS — BP 118/67 | HR 98 | Temp 98.2°F | Resp 16

## 2015-05-28 DIAGNOSIS — C7802 Secondary malignant neoplasm of left lung: Secondary | ICD-10-CM | POA: Diagnosis not present

## 2015-05-28 DIAGNOSIS — C773 Secondary and unspecified malignant neoplasm of axilla and upper limb lymph nodes: Secondary | ICD-10-CM | POA: Diagnosis not present

## 2015-05-28 DIAGNOSIS — Z5111 Encounter for antineoplastic chemotherapy: Secondary | ICD-10-CM

## 2015-05-28 DIAGNOSIS — C7801 Secondary malignant neoplasm of right lung: Secondary | ICD-10-CM

## 2015-05-28 DIAGNOSIS — C7931 Secondary malignant neoplasm of brain: Secondary | ICD-10-CM

## 2015-05-28 DIAGNOSIS — C50911 Malignant neoplasm of unspecified site of right female breast: Secondary | ICD-10-CM

## 2015-05-28 DIAGNOSIS — C787 Secondary malignant neoplasm of liver and intrahepatic bile duct: Secondary | ICD-10-CM

## 2015-05-28 DIAGNOSIS — K668 Other specified disorders of peritoneum: Secondary | ICD-10-CM

## 2015-05-28 DIAGNOSIS — C7971 Secondary malignant neoplasm of right adrenal gland: Secondary | ICD-10-CM

## 2015-05-28 DIAGNOSIS — C7951 Secondary malignant neoplasm of bone: Secondary | ICD-10-CM | POA: Diagnosis not present

## 2015-05-28 DIAGNOSIS — C50411 Malignant neoplasm of upper-outer quadrant of right female breast: Secondary | ICD-10-CM | POA: Diagnosis not present

## 2015-05-28 DIAGNOSIS — C50919 Malignant neoplasm of unspecified site of unspecified female breast: Secondary | ICD-10-CM

## 2015-05-28 MED ORDER — PALONOSETRON HCL INJECTION 0.25 MG/5ML
0.2500 mg | Freq: Once | INTRAVENOUS | Status: AC
  Administered 2015-05-28: 0.25 mg via INTRAVENOUS

## 2015-05-28 MED ORDER — SODIUM CHLORIDE 0.9 % IJ SOLN
10.0000 mL | INTRAMUSCULAR | Status: DC | PRN
  Administered 2015-05-28: 10 mL
  Filled 2015-05-28: qty 10

## 2015-05-28 MED ORDER — SODIUM CHLORIDE 0.9 % IV SOLN
75.0000 mg/m2 | Freq: Once | INTRAVENOUS | Status: AC
  Administered 2015-05-28: 140 mg via INTRAVENOUS
  Filled 2015-05-28: qty 140

## 2015-05-28 MED ORDER — PALONOSETRON HCL INJECTION 0.25 MG/5ML
INTRAVENOUS | Status: AC
  Filled 2015-05-28: qty 5

## 2015-05-28 MED ORDER — SODIUM CHLORIDE 0.9 % IV SOLN
Freq: Once | INTRAVENOUS | Status: AC
  Administered 2015-05-28: 12:00:00 via INTRAVENOUS

## 2015-05-28 MED ORDER — SODIUM CHLORIDE 0.9 % IV SOLN
Freq: Once | INTRAVENOUS | Status: AC
  Administered 2015-05-28: 12:00:00 via INTRAVENOUS
  Filled 2015-05-28: qty 5

## 2015-05-28 MED ORDER — POTASSIUM CHLORIDE 2 MEQ/ML IV SOLN
Freq: Once | INTRAVENOUS | Status: AC
  Administered 2015-05-28: 10:00:00 via INTRAVENOUS
  Filled 2015-05-28: qty 10

## 2015-05-28 MED ORDER — HEPARIN SOD (PORK) LOCK FLUSH 100 UNIT/ML IV SOLN
500.0000 [IU] | Freq: Once | INTRAVENOUS | Status: AC | PRN
  Administered 2015-05-28: 500 [IU]
  Filled 2015-05-28: qty 5

## 2015-05-28 MED ORDER — ZOLEDRONIC ACID 4 MG/100ML IV SOLN
4.0000 mg | Freq: Once | INTRAVENOUS | Status: AC
  Administered 2015-05-28: 4 mg via INTRAVENOUS
  Filled 2015-05-28: qty 100

## 2015-05-28 NOTE — Patient Instructions (Signed)
[facility] Discharge Instructions for Patients Receiving Chemotherapy  Today you received the following chemotherapy agent: Cisplatin   To help prevent nausea and vomiting after your treatment, we encourage you to take your nausea medication as prescribed.    If you develop nausea and vomiting that is not controlled by your nausea medication, call the clinic.   BELOW ARE SYMPTOMS THAT SHOULD BE REPORTED IMMEDIATELY:  *FEVER GREATER THAN 100.5 F  *CHILLS WITH OR WITHOUT FEVER  NAUSEA AND VOMITING THAT IS NOT CONTROLLED WITH YOUR NAUSEA MEDICATION  *UNUSUAL SHORTNESS OF BREATH  *UNUSUAL BRUISING OR BLEEDING  TENDERNESS IN MOUTH AND THROAT WITH OR WITHOUT PRESENCE OF ULCERS  *URINARY PROBLEMS  *BOWEL PROBLEMS  UNUSUAL RASH Items with * indicate a potential emergency and should be followed up as soon as possible.  Feel free to call the clinic you have any questions or concerns. The clinic phone number is [phone].  Please show the [facility] at check-in to the Emergency Department and triage nurse.  Zoledronic Acid injection (Hypercalcemia, Oncology) What is this medicine? ZOLEDRONIC ACID (ZOE le dron ik AS id) lowers the amount of calcium loss from bone. It is used to treat too much calcium in your blood from cancer. It is also used to prevent complications of cancer that has spread to the bone. This medicine may be used for other purposes; ask your health care provider or pharmacist if you have questions. What should I tell my health care provider before I take this medicine? They need to know if you have any of these conditions: -aspirin-sensitive asthma -cancer, especially if you are receiving medicines used to treat cancer -dental disease or wear dentures -infection -kidney disease -receiving corticosteroids like dexamethasone or prednisone -an unusual or allergic reaction to zoledronic acid, other medicines, foods, dyes, or  preservatives -pregnant or trying to get pregnant -breast-feeding How should I use this medicine? This medicine is for infusion into a vein. It is given by a health care professional in a hospital or clinic setting. Talk to your pediatrician regarding the use of this medicine in children. Special care may be needed. Overdosage: If you think you have taken too much of this medicine contact a poison control center or emergency room at once. NOTE: This medicine is only for you. Do not share this medicine with others. What if I miss a dose? It is important not to miss your dose. Call your doctor or health care professional if you are unable to keep an appointment. What may interact with this medicine? -certain antibiotics given by injection -NSAIDs, medicines for pain and inflammation, like ibuprofen or naproxen -some diuretics like bumetanide, furosemide -teriparatide -thalidomide This list may not describe all possible interactions. Give your health care provider a list of all the medicines, herbs, non-prescription drugs, or dietary supplements you use. Also tell them if you smoke, drink alcohol, or use illegal drugs. Some items may interact with your medicine. What should I watch for while using this medicine? Visit your doctor or health care professional for regular checkups. It may be some time before you see the benefit from this medicine. Do not stop taking your medicine unless your doctor tells you to. Your doctor may order blood tests or other tests to see how you are doing. Women should inform their doctor if they wish to become pregnant or think they might be pregnant. There is a potential for serious side effects to an unborn child. Talk to your health care  professional or pharmacist for more information. You should make sure that you get enough calcium and vitamin D while you are taking this medicine. Discuss the foods you eat and the vitamins you take with your health care  professional. Some people who take this medicine have severe bone, joint, and/or muscle pain. This medicine may also increase your risk for jaw problems or a broken thigh bone. Tell your doctor right away if you have severe pain in your jaw, bones, joints, or muscles. Tell your doctor if you have any pain that does not go away or that gets worse. Tell your dentist and dental surgeon that you are taking this medicine. You should not have major dental surgery while on this medicine. See your dentist to have a dental exam and fix any dental problems before starting this medicine. Take good care of your teeth while on this medicine. Make sure you see your dentist for regular follow-up appointments. What side effects may I notice from receiving this medicine? Side effects that you should report to your doctor or health care professional as soon as possible: -allergic reactions like skin rash, itching or hives, swelling of the face, lips, or tongue -anxiety, confusion, or depression -breathing problems -changes in vision -eye pain -feeling faint or lightheaded, falls -jaw pain, especially after dental work -mouth sores -muscle cramps, stiffness, or weakness -redness, blistering, peeling or loosening of the skin, including inside the mouth -trouble passing urine or change in the amount of urine Side effects that usually do not require medical attention (report to your doctor or health care professional if they continue or are bothersome): -bone, joint, or muscle pain -constipation -diarrhea -fever -hair loss -irritation at site where injected -loss of appetite -nausea, vomiting -stomach upset -trouble sleeping -trouble swallowing -weak or tired This list may not describe all possible side effects. Call your doctor for medical advice about side effects. You may report side effects to FDA at 1-800-FDA-1088. Where should I keep my medicine? This drug is given in a hospital or clinic and will not  be stored at home. NOTE: This sheet is a summary. It may not cover all possible information. If you have questions about this medicine, talk to your doctor, pharmacist, or health care provider.    2016, Elsevier/Gold Standard. (2013-10-04 14:19:39)

## 2015-05-28 NOTE — Progress Notes (Signed)
Had to release Zometa again today per pharmacy because it was released 2 days ago but patient had to leave clinic prior to treatment being completed and therefore it needs to be given today.

## 2015-05-29 ENCOUNTER — Ambulatory Visit (HOSPITAL_BASED_OUTPATIENT_CLINIC_OR_DEPARTMENT_OTHER): Payer: BLUE CROSS/BLUE SHIELD

## 2015-05-29 ENCOUNTER — Other Ambulatory Visit: Payer: Self-pay | Admitting: Oncology

## 2015-05-29 VITALS — BP 129/84 | HR 88 | Temp 98.8°F | Resp 20

## 2015-05-29 DIAGNOSIS — E860 Dehydration: Secondary | ICD-10-CM

## 2015-05-29 DIAGNOSIS — C50911 Malignant neoplasm of unspecified site of right female breast: Secondary | ICD-10-CM | POA: Diagnosis not present

## 2015-05-29 DIAGNOSIS — C7951 Secondary malignant neoplasm of bone: Secondary | ICD-10-CM | POA: Diagnosis not present

## 2015-05-29 MED ORDER — ALTEPLASE 2 MG IJ SOLR
2.0000 mg | Freq: Once | INTRAMUSCULAR | Status: DC | PRN
  Filled 2015-05-29: qty 2

## 2015-05-29 MED ORDER — SODIUM CHLORIDE 0.9 % IJ SOLN
3.0000 mL | Freq: Once | INTRAMUSCULAR | Status: DC | PRN
  Filled 2015-05-29: qty 10

## 2015-05-29 MED ORDER — HEPARIN SOD (PORK) LOCK FLUSH 100 UNIT/ML IV SOLN
250.0000 [IU] | Freq: Once | INTRAVENOUS | Status: DC | PRN
  Filled 2015-05-29: qty 5

## 2015-05-29 MED ORDER — SODIUM CHLORIDE 0.9 % IV SOLN
INTRAVENOUS | Status: DC
  Administered 2015-05-29: 10:00:00 via INTRAVENOUS

## 2015-05-29 MED ORDER — SODIUM CHLORIDE 0.9 % IJ SOLN
10.0000 mL | Freq: Once | INTRAMUSCULAR | Status: AC
Start: 2015-05-29 — End: 2015-05-29
  Administered 2015-05-29: 10 mL via INTRAVENOUS
  Filled 2015-05-29: qty 10

## 2015-05-29 MED ORDER — HEPARIN SOD (PORK) LOCK FLUSH 100 UNIT/ML IV SOLN
500.0000 [IU] | Freq: Once | INTRAVENOUS | Status: AC
  Administered 2015-05-29: 500 [IU] via INTRAVENOUS
  Filled 2015-05-29: qty 5

## 2015-05-29 NOTE — Patient Instructions (Signed)

## 2015-05-31 ENCOUNTER — Ambulatory Visit (HOSPITAL_BASED_OUTPATIENT_CLINIC_OR_DEPARTMENT_OTHER): Payer: BLUE CROSS/BLUE SHIELD

## 2015-05-31 VITALS — BP 120/76 | HR 68 | Temp 97.7°F | Resp 20

## 2015-05-31 DIAGNOSIS — C787 Secondary malignant neoplasm of liver and intrahepatic bile duct: Secondary | ICD-10-CM | POA: Diagnosis not present

## 2015-05-31 DIAGNOSIS — C7971 Secondary malignant neoplasm of right adrenal gland: Secondary | ICD-10-CM

## 2015-05-31 DIAGNOSIS — C7951 Secondary malignant neoplasm of bone: Secondary | ICD-10-CM | POA: Diagnosis not present

## 2015-05-31 DIAGNOSIS — C50411 Malignant neoplasm of upper-outer quadrant of right female breast: Secondary | ICD-10-CM | POA: Diagnosis not present

## 2015-05-31 DIAGNOSIS — C50919 Malignant neoplasm of unspecified site of unspecified female breast: Secondary | ICD-10-CM

## 2015-05-31 DIAGNOSIS — C7801 Secondary malignant neoplasm of right lung: Secondary | ICD-10-CM

## 2015-05-31 DIAGNOSIS — C50911 Malignant neoplasm of unspecified site of right female breast: Secondary | ICD-10-CM

## 2015-05-31 DIAGNOSIS — C7802 Secondary malignant neoplasm of left lung: Secondary | ICD-10-CM

## 2015-05-31 DIAGNOSIS — C773 Secondary and unspecified malignant neoplasm of axilla and upper limb lymph nodes: Secondary | ICD-10-CM | POA: Diagnosis not present

## 2015-05-31 DIAGNOSIS — C7931 Secondary malignant neoplasm of brain: Secondary | ICD-10-CM

## 2015-05-31 MED ORDER — SODIUM CHLORIDE 0.9 % IJ SOLN
10.0000 mL | INTRAMUSCULAR | Status: DC | PRN
  Administered 2015-05-31: 10 mL
  Filled 2015-05-31: qty 10

## 2015-05-31 MED ORDER — SODIUM CHLORIDE 0.9 % IV SOLN
INTRAVENOUS | Status: DC
  Administered 2015-05-31: 10:00:00 via INTRAVENOUS

## 2015-05-31 MED ORDER — HEPARIN SOD (PORK) LOCK FLUSH 100 UNIT/ML IV SOLN
500.0000 [IU] | Freq: Once | INTRAVENOUS | Status: AC | PRN
  Administered 2015-05-31: 500 [IU]
  Filled 2015-05-31: qty 5

## 2015-05-31 NOTE — Patient Instructions (Signed)

## 2015-06-01 ENCOUNTER — Telehealth: Payer: Self-pay | Admitting: *Deleted

## 2015-06-01 ENCOUNTER — Other Ambulatory Visit: Payer: BLUE CROSS/BLUE SHIELD

## 2015-06-01 ENCOUNTER — Ambulatory Visit: Payer: BLUE CROSS/BLUE SHIELD

## 2015-06-01 NOTE — Telephone Encounter (Signed)
-----   Message from Herschell Dimes, RN sent at 05/28/2015  5:12 PM EST ----- Regarding: follow up call first time Cisplatin -Dr. [name] Contact: [phone] Patient tolerated well first time Cisplatin today 05/28/15.  Please call she is a Dr. [name] patient. [phone]

## 2015-06-01 NOTE — Telephone Encounter (Signed)
Pt was seen on Saturday post treatment for additional IVFs. No issues at that time.  This RN called pt today to check on status and obtained VM. Message left to return call if needed. Pt is scheduled for follow up visit tomorrow.

## 2015-06-02 ENCOUNTER — Ambulatory Visit: Payer: BLUE CROSS/BLUE SHIELD | Admitting: Radiation Oncology

## 2015-06-02 ENCOUNTER — Ambulatory Visit: Payer: BLUE CROSS/BLUE SHIELD

## 2015-06-02 ENCOUNTER — Ambulatory Visit
Admission: RE | Admit: 2015-06-02 | Payer: BLUE CROSS/BLUE SHIELD | Source: Ambulatory Visit | Admitting: Radiation Oncology

## 2015-06-02 ENCOUNTER — Ambulatory Visit
Admission: RE | Admit: 2015-06-02 | Discharge: 2015-06-02 | Disposition: A | Discharge status: Home / Self Care | Payer: BLUE CROSS/BLUE SHIELD | Source: Ambulatory Visit | Attending: Radiation Oncology | Admitting: Radiation Oncology

## 2015-06-02 ENCOUNTER — Other Ambulatory Visit: Payer: Self-pay | Admitting: *Deleted

## 2015-06-02 ENCOUNTER — Ambulatory Visit (HOSPITAL_BASED_OUTPATIENT_CLINIC_OR_DEPARTMENT_OTHER): Payer: BLUE CROSS/BLUE SHIELD | Admitting: Oncology

## 2015-06-02 ENCOUNTER — Other Ambulatory Visit (HOSPITAL_BASED_OUTPATIENT_CLINIC_OR_DEPARTMENT_OTHER): Payer: BLUE CROSS/BLUE SHIELD

## 2015-06-02 VITALS — BP 87/48 | HR 83 | Temp 98.2°F | Resp 17 | Ht 67.5 in | Wt 152.0 lb

## 2015-06-02 DIAGNOSIS — C7951 Secondary malignant neoplasm of bone: Secondary | ICD-10-CM | POA: Diagnosis not present

## 2015-06-02 DIAGNOSIS — C7931 Secondary malignant neoplasm of brain: Secondary | ICD-10-CM

## 2015-06-02 DIAGNOSIS — C7802 Secondary malignant neoplasm of left lung: Secondary | ICD-10-CM

## 2015-06-02 DIAGNOSIS — C50411 Malignant neoplasm of upper-outer quadrant of right female breast: Secondary | ICD-10-CM

## 2015-06-02 DIAGNOSIS — C787 Secondary malignant neoplasm of liver and intrahepatic bile duct: Secondary | ICD-10-CM

## 2015-06-02 DIAGNOSIS — C773 Secondary and unspecified malignant neoplasm of axilla and upper limb lymph nodes: Secondary | ICD-10-CM | POA: Diagnosis not present

## 2015-06-02 DIAGNOSIS — R5383 Other fatigue: Secondary | ICD-10-CM

## 2015-06-02 DIAGNOSIS — C50911 Malignant neoplasm of unspecified site of right female breast: Secondary | ICD-10-CM

## 2015-06-02 DIAGNOSIS — C7801 Secondary malignant neoplasm of right lung: Secondary | ICD-10-CM

## 2015-06-02 DIAGNOSIS — R609 Edema, unspecified: Secondary | ICD-10-CM

## 2015-06-02 DIAGNOSIS — I959 Hypotension, unspecified: Secondary | ICD-10-CM

## 2015-06-02 LAB — CBC WITH DIFFERENTIAL/PLATELET
BASO%: 0.3 % (ref 0.0–2.0)
BASOS ABS: 0 10*3/uL (ref 0.0–0.1)
EOS%: 0 % (ref 0.0–7.0)
Eosinophils Absolute: 0 10*3/uL (ref 0.0–0.5)
HEMATOCRIT: 35.5 % (ref 34.8–46.6)
HEMOGLOBIN: 11.7 g/dL (ref 11.6–15.9)
LYMPH#: 0.2 10*3/uL — AB (ref 0.9–3.3)
LYMPH%: 2.5 % — ABNORMAL LOW (ref 14.0–49.7)
MCH: 30.5 pg (ref 25.1–34.0)
MCHC: 33 g/dL (ref 31.5–36.0)
MCV: 92.4 fL (ref 79.5–101.0)
MONO#: 0.2 10*3/uL (ref 0.1–0.9)
MONO%: 3.5 % (ref 0.0–14.0)
NEUT%: 93.7 % — ABNORMAL HIGH (ref 38.4–76.8)
NEUTROS ABS: 6.2 10*3/uL (ref 1.5–6.5)
Platelets: 247 10*3/uL (ref 145–400)
RBC: 3.85 10*6/uL (ref 3.70–5.45)
RDW: 18 % — AB (ref 11.2–14.5)
WBC: 6.6 10*3/uL (ref 3.9–10.3)

## 2015-06-02 LAB — COMPREHENSIVE METABOLIC PANEL
ALBUMIN: 2.8 g/dL — AB (ref 3.5–5.0)
ANION GAP: 11 meq/L (ref 3–11)
AST: 17 U/L (ref 5–34)
Alkaline Phosphatase: 110 U/L (ref 40–150)
BUN: 13.7 mg/dL (ref 7.0–26.0)
CHLORIDE: 88 meq/L — AB (ref 98–109)
CO2: 35 mEq/L — ABNORMAL HIGH (ref 22–29)
Calcium: 8.2 mg/dL — ABNORMAL LOW (ref 8.4–10.4)
Creatinine: 0.6 mg/dL (ref 0.6–1.1)
EGFR: >90 mL/min/{1.73_m2} (ref >90)
Glucose: 86 mg/dl (ref 70–140)
POTASSIUM: 2.9 meq/L — AB (ref 3.5–5.1)
SODIUM: 134 meq/L — AB (ref 136–145)
TOTAL PROTEIN: 6 g/dL — AB (ref 6.4–8.3)
Total Bilirubin: 0.31 mg/dL (ref 0.20–1.20)

## 2015-06-02 MED ORDER — POTASSIUM CHLORIDE ER 10 MEQ PO CPCR: 20.0000 meq | ORAL_CAPSULE | Freq: Two times a day (BID) | ORAL | Status: DC

## 2015-06-02 NOTE — Progress Notes (Signed)
And a Patient ID: Nicole Valentine, female   DOB: [birth year], 50 y.o.   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 She had a mammogram performed that showed dense breasts bilaterally. Ultrasound of the right breast showed 2.3 x 1.9 cm area of abnormality with multiple abnormal lymph nodes. MRI of the bilateral breasts showed asymmetrical enhancement throughout the right breast consistent with multicentric disease. An ultrasound-guided biopsy performed. The known area of disease measured 1.9 x 1.9 x 2.3 cm. Multiple abnormal positive right axillary lymph nodes were noted. 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 Biopsy of the right axillary lymph node was positive for malignancy with extracapsular extension. Patient was originally seen by Dr. [name] Dr. [name] and Dr. [name]. She has elected to have a right mastectomy eventually and declined biopsies of any other areas within the breast."  She went on to receive neoadjuvant chemotherapy and attained a complete pathologic response, as documented below  METASTATIC DISEASE: From the prior summary note: Nicole Valentine noted a very small right supraclavicular mass 09/25/2014, which she brought to our attention. She was having some allergy symptoms at the time so we decided to reevaluate this after a few weeks and on 10/22/2014 as the mass persisted we obtained a restaging neck CT scan, 10/28/2014. There was bilateral supraclavicular adenopathy, but also a large right pleural  effusion was incidentally noted. We proceeded to right thoracentesis 10/29/2014, and a liter of hazy yellow fluid was removed. Cytology from this procedure ([id number]) showed malignant cells consistent with adenocarcinoma, estrogen and progesterone receptor negative, HER-2 not amplified, with a signals ratio 1.12 and the number per cell being 2.75, and with an MIB-1 of 80%. I called [name] with these results and set her up for a PET scan performed 11/06/2014, which shows widespread metastatic disease involving particularly the right lung, liver, and bones, but also the left lung, right adrenal, and multiple lymph node areas.  Her subsequent history is as detailed below.  INTERVAL HISTORY: Nicole Valentine returns today for follow-up of her stage IV triple negative breast cancer.  Today is day 6 cycle 1 of cisplatin, you've an every 21 days .  REVIEW OF S/YSTEMS: Nicole Valentine did surprisingly well with the first dose of cisplatin and in particular she had absolutely no nausea problems. She used to dexamethasone and metoclopramide for 2 days. She then did very well on days 4 and the early part of day 5 but by the evening of day 5 which was yesterday evening she was just absolutely wiped out she had no energy whatsoever. She doesn't feel bad otherwise is just severe fatigue. In fact her lower extremity swelling has decreased considerably and she feels her breathing is if anything better. She is eating poorly although she is doing her best to eat. She cannot tolerate boost or similar supplements because they're too sweet. She prefers salty at this point. She denies cough, denies pain, and has had no problems with diarrhea or constipation. She does not have any peripheral neuropathy. She has not had any intercurrent fevers, headache, or problems  with dizziness or gait imbalance. A detailed review of systems today was otherwise stable.  PAST MEDICAL HISTORY: Past Medical History  Diagnosis Date  . Complication of  anesthesia     her father and uncle both had very difficult time waking up-was  something they told them may be hereditory. she will find out.  [name] Hypothyroidism   . 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 Radiation 01/21/13-03/06/13    Right Breast  . Breast cancer ([facility]) dx'd 04-10-12-rt    PAST SURGICAL HISTORY: Past Surgical History  Procedure Laterality Date  . Wisdom tooth extraction    . Portacath placement  04/22/2012    Procedure: INSERTION PORT-A-CATH;  Surgeon: [name], MD;  Location: [facility];  Service: General;  Laterality: Left;  [name] Modified mastectomy Right 11/18/2012    Procedure:  RIGHT MODIFIED MASTECTOMY;  Surgeon: [name], MD;  Location: [facility];  Service: General;  Laterality: Right;  . Knee arthroscopy Right 11/26/2013  . Port-a-cath removal Left 12/10/2013    Procedure: MINOR REMOVAL PORT-A-CATH;  Surgeon: [name], MD;  Location: [facility];  Service: General;  Laterality: Left;    FAMILY HISTORY Family History  Problem Relation Age of Onset  . Lung cancer Maternal Grandfather 42    smoker  . Prostate cancer Maternal Grandfather 90  . Throat cancer Other     Great Aunt x 2  . Liver cancer Other     Maternal Great Grandmother  . Melanoma Maternal Uncle 81  . Brain cancer Cousin 11    non-malignant  . Allergic rhinitis Neg Hx   . Asthma Neg Hx   . Eczema Neg Hx   . Urticaria Neg Hx   the patient's parents are living, in their early 26s. The patient has 2 brothers, no sisters. The patient's mother was diagnosed with breast cancer, HER-2 positive, in 2014 in [place].  GYNECOLOGIC HISTORY:   (Updated 10/03/2013) Menarche age 27, first live birth age 56. She is GX P4. LMP January 2014. 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  The children are currently ages 32, 40, 48, and 33. Her husband,  Nicole Valentine, is a Customer service manager.He just started a job for [employer].  They attend the [employer] DIRECTIVES: The patient's husband is her HCPOA   HEALTH MAINTENANCE:  (Updated 10/03/2013) Social History  Substance Use Topics  . Smoking status: Never Smoker   . Smokeless tobacco: Never Used  . Alcohol Use: No     Colonoscopy: Never  PAP: November 2013/Dr. Smith  Bone density: January 2015, [facility], normal  Lipid panel:  Not on file  Allergies  Allergen Reactions  . Almond Meal Rash  . Almond Oil Rash  . Other Rash    LOTIONS-unknown type  . Tegaderm Ag Mesh [Silver] Rash  . Vancomycin Rash    Red Man Syndrome    Current Outpatient Prescriptions  Medication Sig Dispense Refill  . cholecalciferol (VITAMIN D) 1000 UNITS tablet Take 2,000 Units by mouth 2 (two) times daily.     . ciprofloxacin (CIPRO) 500 MG tablet Take 1 tablet (500 mg total) by mouth 2 (two) times daily. 10 tablet 0  . dexamethasone (DECADRON) 4 MG tablet Take 2 tablets (8 mg total) by mouth 2 (two) times daily with a meal. Starting on day 2  of treatment. Take for 3 days, then stop. 30 tablet 1  . dextromethorphan-guaiFENesin (MUCINEX DM) 30-600 MG 12hr tablet Take 1 tablet by mouth 2 (two) times daily as needed for cough.    . furosemide (LASIX) 20 MG tablet Take 1 tablet (20 mg total) by mouth daily. 30 tablet 4  . levothyroxine (SYNTHROID, LEVOTHROID) 175 MCG tablet Take 175 mcg by mouth daily before breakfast.    . loratadine-pseudoephedrine (CLARITIN-D 12-HOUR) 5-120 MG tablet Take 1 tablet by mouth 2 (two) times daily.    [name] LORazepam (ATIVAN) 0.5 MG tablet Take 1 tablet (0.5 mg total) by mouth at bedtime as needed (Nausea or vomiting). 60 tablet 2  . metoCLOPramide (REGLAN) 5 MG tablet Take 1 tablet (5 mg total) by mouth every 6 (six) hours as needed for nausea. 30 tablet 2  . naproxen sodium (ALEVE) 220 MG tablet Take 440 mg by mouth 2 (two) times daily with a meal. Reported on  05/07/2015    . ondansetron (ZOFRAN) 8 MG tablet Take 1 tablet (8 mg total) by mouth 2 (two) times daily as needed for nausea or vomiting. Start on day 3 of treatment if necessary 30 tablet 2  . pantoprazole (PROTONIX) 40 MG tablet Take 1 tablet (40 mg total) by mouth daily at 12 noon. 30 tablet 2  . saccharomyces boulardii (FLORASTOR) 250 MG capsule Take 1 capsule (250 mg total) by mouth 2 (two) times daily.     No current facility-administered medications for this visit.      OBJECTIVE: Middle-aged white Valentine examined in a wheelchair  Filed Vitals:   06/02/15 1332  BP: 87/48  Pulse: 83  Temp: 98.2 F (36.8 C)  Resp: 17     Body mass index is 23.44 kg/(m^2).    ECOG FS:2 - Symptomatic, <50% confined to bed Filed Weights   06/02/15 1332  Weight: 152 lb (68.947 kg)     pupils are equal, extraocular movements are intact, sclerae are not icteric  Oropharynx shows no thrush  Heart regular rate and rhythm  Lungs no rales or rhonchi, decreased breath sounds at basis  abdomen soft, nontender, positive bowel sounds   minimal bilateral ankle edema  neuro exam nonfocal, well-oriented, appropriate affect  Breast exam deferred  LAB RESULTS:   Lab Results  Component Value Date   WBC 6.6 06/02/2015   NEUTROABS 6.2 06/02/2015   HGB 11.7 06/02/2015   HCT 35.5 06/02/2015   MCV 92.4 06/02/2015   PLT 247 06/02/2015      Chemistry      Component Value Date/Time   NA 138 05/26/2015 1259   NA 137 04/21/2015 0546   K 3.6 05/26/2015 1259   K 3.5 04/21/2015 0546   CL 101 04/21/2015 0546   CL 101 10/18/2012 0859   CO2 40* 05/26/2015 1259   CO2 28 04/21/2015 0546   BUN 9.3 05/26/2015 1259   BUN 10 04/21/2015 0546   CREATININE 0.5* 05/26/2015 1259   CREATININE 0.48 04/21/2015 0546      Component Value Date/Time   CALCIUM 8.9 05/26/2015 1259   CALCIUM 8.3* 04/21/2015 0546   ALKPHOS 100 05/26/2015 1259   ALKPHOS 94 04/21/2015 0546   AST 15 05/26/2015 1259   AST 17 04/21/2015  0546   ALT <9 05/26/2015 1259   ALT 10* 04/21/2015 0546   BILITOT 0.49 05/26/2015 1259   BILITOT 0.6 04/21/2015 0546       STUDIES: No results found.  ASSESSMENT: 50 y.o. BRCA negative Nicole Valentine  with triple-negative stage IV breast cancer  (1) an status post right breast upper outer quadrant and right axillary lymph node biopsy 04/04/2012, both positive for an invasive ductal carcinoma, grade 3, triple negative, with an MIB-1 of 25%  (2) Treated neoadjuvantly with  (a) fluorouracil, cyclophosphamide, and epirubicin (at 100 mg/M2) x4 completed 06/07/2012  (b) docetaxel (75 mg/M2) for one dose, 06/21/2012, poorly tolerated  (c) carboplatin and gemcitabine given every 21 days for 6 cycles completed 10/11/2012  (3) status post right modified radical mastectomy 11/18/2012 showing a complete pathologic response--all 16 lymph nodes were benign  (a) no plans for reconstructon  (4) adjuvant radiation therapy completed 03/06/2013  METASTATIC DISEASE June 2016 (5) 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, 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  (6) staging PET scan 11/06/2014 showed significant disease in the middle and lower lobes of the Right lung, Right effusion, multiple liver and bone lesions, as well as left lung nodules, a Right adrenal nodule, and widespread hypermetabolic adenopathy  (a) brain MRI 11/16/2014 showed multiple cerebellar lesions  (7) RADIATION TREATMENTS thoracic metastases  (a) 01/28/2015-02/11/2015: Thoracic Spine T8-10, 30 Gy in 10 fractions  (7) abraxane day 1 and day 8 of each 21 day cycle started 11/09/2014: last dose 03/12/2015 (6 cycles)  (a) PET scan 01/05/2015 shows an excellent initial response after 3 cycles  (b) PET scan 03/17/2015 shows progression  (8) zolendronate started 11/16/2014, to be repeated every 12 weeks, most recent dose 02/09/2015  (9) Foundation 1 study sent  11/06/2014: patient's sister in law Garvin Fila  following [phone])  (a) 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[place], NTRK1, MYC, ARID1A and LYN, none w approved therapies  (b) pazopanib, ponatinib or crizotinib suggested as possible off-protocol options  (10) RADIATION TREATMENTS: Brain metastases:  (a) s/p SRS therapy 12/11/2014 to 4 right cerebellar lesions  (b) multiple (18+) lesions noted on repeat brain MRI 03/16/2015  (c) whole brain irradiation completed 04/12/2015  (11) uncontrolled pain: Started on OxyContin 10 mg twice a day with oxycodone 5 mg as needed 03/26/2015  (12) Right pleural effusion  (a) s/p thoracentesis July 2016 (x2), 03/30/2015  (13) started eribulin11/29/2016, originally planned to be given day one and day 8 of each 21 day cycle, but switched to every 14 days with onpro support because of persistent leukopenia. Discontinued 05/25/14, after 2 cycles, w no evidence of response  (14) cisplatin started 05/28/2015, to be repeated every 21 days  PLAN:  Audri tolerated the first dose of cisplatin better than expected. It is very favorable that she had no significant problems with nausea.   I think part of this fatigue she is feeling is definitely due to the treatment, but part of it is also due to coming off the steroids. I suggested she take 2 mg of dexamethasone today when she gets home and then take 4 mg every morning with breakfast. I think that'll give her a little bit more energy and also improve her appetite.   I have suggested that she get a cord bottle and drink that bottle between morning and noon and then a second cord bottle between noon and evening. I suggested she continue to eat what she is eating, which is high in protein, but also open up to carbohydrates including pastor, potatoes, and bread.    the blood pressure today was low. She felt she could hydrate orally which is adequate if she can do it at home. I am stopping her Lasix at this point. We will see what happens with  the peripheral edema which seems to have  improved   She did not think she could make it to her later appointment today with Dr. Isidore Moos but went ahead and went home. I have sent a note to Dr. Isidore Moos suggesting that perhaps the patient could be recent scheduled for mid February when we are planning restaging studies including a brain MRI   I asked Nicole Valentine to give me a call on Friday to find out if she is going to need fluids Friday or Saturday. Otherwise I will see her next week. At this point the plan is to continue at least one more cycle of cisplatin before reassessing Chauncey Cruel, MD  06/02/2015    Medical Oncology and Hematology [facility] [place], [place] [address] Tel. [phone]    Fax. [phone]

## 2015-06-03 ENCOUNTER — Other Ambulatory Visit: Payer: Self-pay | Admitting: *Deleted

## 2015-06-03 DIAGNOSIS — C7949 Secondary malignant neoplasm of other parts of nervous system: Principal | ICD-10-CM

## 2015-06-03 DIAGNOSIS — C7931 Secondary malignant neoplasm of brain: Secondary | ICD-10-CM

## 2015-06-03 NOTE — Telephone Encounter (Signed)
1.  Do you need a wheel chair?  YES  2. On oxygen? YES  3. Have you ever had any surgery in the body part being scanned?  NO  4. Have you ever had any surgery on your brain or heart?   NO                   5. Have you ever had surgery on your eyes or ears?    NO                          6. Do you have a pacemaker or defibrillator?  NO  7. Do you have a Neurostimulator?  NO  8. Claustrophobic?  NO  9. Any risk for metal in eyes? NO  10. Injury by bullet, buckshot, or shrapnel?  NO  11. Stent?     NO                                                                                                             12. Hx of Cancer? YES  13. Kidney or Liver disease?  NO  14. Hx of Lupus, Rheumatoid Arthritis or Scleroderma? NO  15. IV Antibiotics or long term use of NSAIDS?   NO  16. HX of Hypertension?  NO  17. Diabetes?  NO  18. Allergy to contrast?  NO  19. Recent labs.

## 2015-06-09 ENCOUNTER — Encounter: Payer: Self-pay | Admitting: Nutrition

## 2015-06-09 ENCOUNTER — Ambulatory Visit: Payer: BLUE CROSS/BLUE SHIELD | Admitting: Nutrition

## 2015-06-09 ENCOUNTER — Other Ambulatory Visit (HOSPITAL_BASED_OUTPATIENT_CLINIC_OR_DEPARTMENT_OTHER): Payer: BLUE CROSS/BLUE SHIELD

## 2015-06-09 ENCOUNTER — Ambulatory Visit (HOSPITAL_BASED_OUTPATIENT_CLINIC_OR_DEPARTMENT_OTHER): Payer: BLUE CROSS/BLUE SHIELD | Admitting: Oncology

## 2015-06-09 ENCOUNTER — Ambulatory Visit: Payer: BLUE CROSS/BLUE SHIELD

## 2015-06-09 VITALS — BP 112/64 | HR 100 | Temp 97.8°F | Resp 18 | Ht 67.5 in | Wt 138.3 lb

## 2015-06-09 DIAGNOSIS — C773 Secondary and unspecified malignant neoplasm of axilla and upper limb lymph nodes: Secondary | ICD-10-CM | POA: Diagnosis not present

## 2015-06-09 DIAGNOSIS — C7931 Secondary malignant neoplasm of brain: Secondary | ICD-10-CM

## 2015-06-09 DIAGNOSIS — C50411 Malignant neoplasm of upper-outer quadrant of right female breast: Secondary | ICD-10-CM

## 2015-06-09 DIAGNOSIS — C7801 Secondary malignant neoplasm of right lung: Secondary | ICD-10-CM

## 2015-06-09 DIAGNOSIS — C787 Secondary malignant neoplasm of liver and intrahepatic bile duct: Secondary | ICD-10-CM

## 2015-06-09 DIAGNOSIS — C7951 Secondary malignant neoplasm of bone: Secondary | ICD-10-CM

## 2015-06-09 DIAGNOSIS — C50911 Malignant neoplasm of unspecified site of right female breast: Secondary | ICD-10-CM

## 2015-06-09 DIAGNOSIS — C7802 Secondary malignant neoplasm of left lung: Secondary | ICD-10-CM

## 2015-06-09 LAB — COMPREHENSIVE METABOLIC PANEL
ALBUMIN: 3.1 g/dL — AB (ref 3.5–5.0)
ALK PHOS: 112 U/L (ref 40–150)
ALT: 10 U/L (ref 0–55)
ANION GAP: 9 meq/L (ref 3–11)
AST: 16 U/L (ref 5–34)
BILIRUBIN TOTAL: 0.4 mg/dL (ref 0.20–1.20)
BUN: 9.4 mg/dL (ref 7.0–26.0)
CO2: 34 mEq/L — ABNORMAL HIGH (ref 22–29)
CREATININE: 0.6 mg/dL (ref 0.6–1.1)
Calcium: 9.2 mg/dL (ref 8.4–10.4)
Chloride: 94 mEq/L — ABNORMAL LOW (ref 98–109)
GLUCOSE: 93 mg/dL (ref 70–140)
Potassium: 3.7 mEq/L (ref 3.5–5.1)
Sodium: 137 mEq/L (ref 136–145)
TOTAL PROTEIN: 6.6 g/dL (ref 6.4–8.3)

## 2015-06-09 LAB — CBC WITH DIFFERENTIAL/PLATELET
BASO%: 0 % (ref 0.0–2.0)
Basophils Absolute: 0 10*3/uL (ref 0.0–0.1)
EOS ABS: 0 10*3/uL (ref 0.0–0.5)
EOS%: 0 % (ref 0.0–7.0)
HCT: 33.3 % — ABNORMAL LOW (ref 34.8–46.6)
HEMOGLOBIN: 10.9 g/dL — AB (ref 11.6–15.9)
LYMPH#: 0.2 10*3/uL — AB (ref 0.9–3.3)
LYMPH%: 5.1 % — ABNORMAL LOW (ref 14.0–49.7)
MCH: 30.6 pg (ref 25.1–34.0)
MCHC: 32.7 g/dL (ref 31.5–36.0)
MCV: 93.5 fL (ref 79.5–101.0)
MONO#: 0.1 10*3/uL (ref 0.1–0.9)
MONO%: 3.2 % (ref 0.0–14.0)
NEUT%: 91.7 % — ABNORMAL HIGH (ref 38.4–76.8)
NEUTROS ABS: 3.7 10*3/uL (ref 1.5–6.5)
PLATELETS: 119 10*3/uL — AB (ref 145–400)
RBC: 3.56 10*6/uL — ABNORMAL LOW (ref 3.70–5.45)
RDW: 16.8 % — AB (ref 11.2–14.5)
WBC: 4.1 10*3/uL (ref 3.9–10.3)

## 2015-06-09 NOTE — Progress Notes (Signed)
And a Patient ID: Nicole Valentine, female   DOB: [birth year], 50 y.o.   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 She had a mammogram performed that showed dense breasts bilaterally. Ultrasound of the right breast showed 2.3 x 1.9 cm area of abnormality with multiple abnormal lymph nodes. MRI of the bilateral breasts showed asymmetrical enhancement throughout the right breast consistent with multicentric disease. An ultrasound-guided biopsy performed. The known area of disease measured 1.9 x 1.9 x 2.3 cm. Multiple abnormal positive right axillary lymph nodes were noted. 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 Biopsy of the right axillary lymph node was positive for malignancy with extracapsular extension. Patient was originally seen by Dr. [name] Dr. [name] and Dr. [name]. She has elected to have a right mastectomy eventually and declined biopsies of any other areas within the breast."  She went on to receive neoadjuvant chemotherapy and attained a complete pathologic response, as documented below  METASTATIC DISEASE: From the prior summary note: Tierrah noted a very small right supraclavicular mass 09/25/2014, which she brought to our attention. She was having some allergy symptoms at the time so we decided to reevaluate this after a few weeks and on 10/22/2014 as the mass persisted we obtained a restaging neck CT scan, 10/28/2014. There was bilateral supraclavicular adenopathy, but also a large right pleural  effusion was incidentally noted. We proceeded to right thoracentesis 10/29/2014, and a liter of hazy yellow fluid was removed. Cytology from this procedure ([id number]) showed malignant cells consistent with adenocarcinoma, estrogen and progesterone receptor negative, HER-2 not amplified, with a signals ratio 1.12 and the number per cell being 2.75, and with an MIB-1 of 80%. I called Nicole Valentine with these results and set her up for a PET scan performed 11/06/2014, which shows widespread metastatic disease involving particularly the right lung, liver, and bones, but also the left lung, right adrenal, and multiple lymph node areas.  Her subsequent history is as detailed below.  INTERVAL HISTORY: [name] returns today for follow-up of her stage IV triple negative breast cancer.  Today is day 12 cycle 1 of cisplatin, given every 21 days .  REVIEW OF S/YSTEMS: Nicole Valentine continues to report significant improvement. Yesterday she was able to go upstairs 3 times, today so far 2 times without oxygen. Also this morning she found that she couldn't lie flat without getting short of breath or coughing, which she could not do before. Her peripheral edema has completely resolved, and that may be part of the reason her weight is down about 10 pounds compared to last week. She has occasionally a little constipated. Her urine is clear. She has had no headaches, visual changes, nausea, or vomiting. She is tolerating the Micro-K without difficulty. As far as nutrition is concerned she eats what sounds like a good diet and she is drinking about 4 bottles of water a day. She reports no pain, fever, rash, or bleeding. A detailed review of systems today was otherwise stable  PAST MEDICAL HISTORY: Past Medical History  Diagnosis Date  . Complication of  anesthesia     her father and uncle both had very difficult time waking up-was  something they told them may be hereditory. she will find out.  Nicole Valentine Hypothyroidism   . Allergy      almond =itchy lips  . Radiation 01/21/13-03/06/13    Right Breast  . Breast cancer ([place]) dx'd 04-10-12-rt    PAST SURGICAL HISTORY: Past Surgical History  Procedure Laterality Date  . Wisdom tooth extraction    . Portacath placement  04/22/2012    Procedure: INSERTION PORT-A-CATH;  Surgeon: [name], MD;  Location: [place] [facility];  Service: General;  Laterality: Left;  Nicole Valentine Modified mastectomy Right 11/18/2012    Procedure:  RIGHT MODIFIED MASTECTOMY;  Surgeon: [name], MD;  Location: [place];  Service: General;  Laterality: Right;  . Knee arthroscopy Right 11/26/2013  . Port-a-cath removal Left 12/10/2013    Procedure: MINOR REMOVAL PORT-A-CATH;  Surgeon: [name], MD;  Location: [place];  Service: General;  Laterality: Left;    FAMILY HISTORY Family History  Problem Relation Age of Onset  . Lung cancer Maternal Grandfather 29    smoker  . Prostate cancer Maternal Grandfather 90  . Throat cancer Other     Great Aunt x 2  . Liver cancer Other     Maternal Great Grandmother  . Melanoma Maternal Uncle 81  . Brain cancer Cousin 11    non-malignant  . Allergic rhinitis Neg Hx   . Asthma Neg Hx   . Eczema Neg Hx   . Urticaria Neg Hx   the patient's parents are living, in their early 19s. The patient has 2 brothers, no sisters. The patient's mother was diagnosed with breast cancer, HER-2 positive, in 2014 in [place].  GYNECOLOGIC HISTORY:   (Updated 10/03/2013) Menarche age 8, first live birth age 30. She is GX P4. LMP January 2014. 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  The children are currently ages 19, 69, 72, and 57. Her husband, [name], is a Customer service manager.He just started a job for [employer].  They attend the [employer] DIRECTIVES: The patient's husband is her HCPOA   HEALTH  MAINTENANCE:  (Updated 10/03/2013) Social History  Substance Use Topics  . Smoking status: Never Smoker   . Smokeless tobacco: Never Used  . Alcohol Use: No     Colonoscopy: Never  PAP: November 2013/Dr. Smith  Bone density: January 2015, [facility], normal  Lipid panel:  Not on file  Allergies  Allergen Reactions  . Almond Meal Rash  . Almond Oil Rash  . Other Rash    LOTIONS-unknown type  . Tegaderm Ag Mesh [Silver] Rash  . Vancomycin Rash    Red Man Syndrome    Current Outpatient Prescriptions  Medication Sig Dispense Refill  . cholecalciferol (VITAMIN D) 1000 UNITS tablet Take 2,000 Units by mouth 2 (two) times daily.     Nicole Valentine dexamethasone (DECADRON) 4 MG tablet Take 2 tablets (8 mg total) by mouth 2 (two) times daily with a meal. Starting on day 2 of treatment. Take for 3 days, then stop. 30 tablet 1  . dextromethorphan-guaiFENesin (MUCINEX DM) 30-600 MG 12hr tablet Take 1 tablet by mouth 2 (two) times daily as needed for cough.    . furosemide (LASIX) 20 MG tablet Take 1 tablet (20 mg total) by mouth  daily. 30 tablet 4  . levothyroxine (SYNTHROID, LEVOTHROID) 175 MCG tablet Take 175 mcg by mouth daily before breakfast.    . loratadine-pseudoephedrine (CLARITIN-D 12-HOUR) 5-120 MG tablet Take 1 tablet by mouth 2 (two) times daily.    Nicole Valentine LORazepam (ATIVAN) 0.5 MG tablet Take 1 tablet (0.5 mg total) by mouth at bedtime as needed (Nausea or vomiting). 60 tablet 2  . metoCLOPramide (REGLAN) 5 MG tablet Take 1 tablet (5 mg total) by mouth every 6 (six) hours as needed for nausea. 30 tablet 2  . naproxen sodium (ALEVE) 220 MG tablet Take 440 mg by mouth 2 (two) times daily with a meal. Reported on 05/07/2015    . ondansetron (ZOFRAN) 8 MG tablet Take 1 tablet (8 mg total) by mouth 2 (two) times daily as needed for nausea or vomiting. Start on day 3 of treatment if necessary 30 tablet 2  . pantoprazole (PROTONIX) 40 MG tablet Take 1 tablet (40 mg total) by mouth daily at 12 noon. 30 tablet  2  . potassium chloride (MICRO-K) 10 MEQ CR capsule Take 2 capsules (20 mEq total) by mouth 2 (two) times daily. 60 capsule 3  . saccharomyces boulardii (FLORASTOR) 250 MG capsule Take 1 capsule (250 mg total) by mouth 2 (two) times daily.     No current facility-administered medications for this visit.      OBJECTIVE: Middle-aged white woman in no acute distress  Filed Vitals:   06/09/15 1111  BP: 112/64  Pulse: 100  Temp: 97.8 F (36.6 C)  Resp: 18     Body mass index is 21.33 kg/(m^2).    ECOG FS:1 - Symptomatic but completely ambulatory Filed Weights   06/09/15 1111  Weight: 138 lb 4.8 oz (62.732 kg)   weight was 152 pounds a week ago on our scale  Sclerae unicteric, EOMs intact Oropharynx clear, dentition in good repair 3 mm right supraclavicular irregularity stable Lungs no rales or rhonchi, or dullness both bases Heart regular rate and rhythm Abd soft, nontender, positive bowel sounds MSK no focal spinal tenderness, no lower extremity lymphedema Neuro: nonfocal, well oriented, appropriate affect Breasts: Deferred   LAB RESULTS:   Lab Results  Component Value Date   WBC 4.1 06/09/2015   NEUTROABS 3.7 06/09/2015   HGB 10.9* 06/09/2015   HCT 33.3* 06/09/2015   MCV 93.5 06/09/2015   PLT 119* 06/09/2015      Chemistry      Component Value Date/Time   NA 134* 06/02/2015 1313   NA 137 04/21/2015 0546   K 2.9* 06/02/2015 1313   K 3.5 04/21/2015 0546   CL 101 04/21/2015 0546   CL 101 10/18/2012 0859   CO2 35* 06/02/2015 1313   CO2 28 04/21/2015 0546   BUN 13.7 06/02/2015 1313   BUN 10 04/21/2015 0546   CREATININE 0.6 06/02/2015 1313   CREATININE 0.48 04/21/2015 0546      Component Value Date/Time   CALCIUM 8.2* 06/02/2015 1313   CALCIUM 8.3* 04/21/2015 0546   ALKPHOS 110 06/02/2015 1313   ALKPHOS 94 04/21/2015 0546   AST 17 06/02/2015 1313   AST 17 04/21/2015 0546   ALT <9 06/02/2015 1313   ALT 10* 04/21/2015 0546   BILITOT 0.31 06/02/2015 1313    BILITOT 0.6 04/21/2015 0546       STUDIES: No results found.  ASSESSMENT: 50 y.o. BRCA negative [place] woman with triple-negative stage IV breast cancer  (1) an status post right breast upper outer quadrant and right axillary  lymph node biopsy 04/04/2012, both positive for an invasive ductal carcinoma, grade 3, triple negative, with an MIB-1 of 25%  (2) Treated neoadjuvantly with  (a) fluorouracil, cyclophosphamide, and epirubicin (at 100 mg/M2) x4 completed 06/07/2012  (b) docetaxel (75 mg/M2) for one dose, 06/21/2012, poorly tolerated  (c) carboplatin and gemcitabine given every 21 days for 6 cycles completed 10/11/2012  (3) status post right modified radical mastectomy 11/18/2012 showing a complete pathologic response--all 16 lymph nodes were benign  (a) no plans for reconstructon  (4) adjuvant radiation therapy completed 03/06/2013  METASTATIC DISEASE June 2016 (5) 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, 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  (6) staging PET scan 11/06/2014 showed significant disease in the middle and lower lobes of the Right lung, Right effusion, multiple liver and bone lesions, as well as left lung nodules, a Right adrenal nodule, and widespread hypermetabolic adenopathy  (a) brain MRI 11/16/2014 showed multiple cerebellar lesions  (7) RADIATION TREATMENTS thoracic metastases  (a) 01/28/2015-02/11/2015: Thoracic Spine T8-10, 30 Gy in 10 fractions  (7) abraxane day 1 and day 8 of each 21 day cycle started 11/09/2014: last dose 03/12/2015 (6 cycles)  (a) PET scan 01/05/2015 shows an excellent initial response after 3 cycles  (b) PET scan 03/17/2015 shows progression  (8) zolendronate started 11/16/2014, to be repeated every 12 weeks, most recent dose 02/09/2015  (9) Foundation 1 study sent  11/06/2014: patient's sister in law Garvin Fila following [phone])  (a) 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[place], NTRK1, MYC, ARID1A and LYN, none w  approved therapies  (b) pazopanib, ponatinib or crizotinib suggested as possible off-protocol options  (10) RADIATION TREATMENTS: Brain metastases:  (a) s/p SRS therapy 12/11/2014 to 4 right cerebellar lesions  (b) multiple (18+) lesions noted on repeat brain MRI 03/16/2015  (c) whole brain irradiation completed 04/12/2015  (11) uncontrolled pain: Started on OxyContin 10 mg twice a day with oxycodone 5 mg as needed 03/26/2015  (12) Right pleural effusion  (a) s/p thoracentesis July 2016 (x2), 03/30/2015  (13) started eribulin11/29/2016, originally planned to be given day one and day 8 of each 21 day cycle, but switched to every 14 days with onpro support because of persistent leukopenia. Discontinued 05/25/14, after 2 cycles, w no evidence of response  (14) cisplatin started 05/28/2015, to be repeated every 21 days  PLAN:  Everlynn feels better this week than last. Of course last week she had some side effects from the cisplatin. It is significant that she has lost a lot of weight. Part of this may be nutrition, part of it may be fluids.  As far as the nutrition is concerned we talked about her having 2 beers a day and eating a low. Today. She will also start a multivitamin tablet.  I am delighted that her breathing is better: She can now lie flat without coughing or feeling short of breath. She is also walking up stairs several times a day without oxygen. She has lost her peripheral edema. (This of course likely accounts for some of the weight loss).  At this point I'm very encouraged that she may be responding to the cisplatin. She will have her next dose next week. We will make sure to hydrate for several days afterwards. The plan is to go through 3 cycles of cisplatin and then restage.  She has a good understanding of the overall plan. She agrees with it. She knows to call for any problems that may develop before her next visit here. Chauncey Cruel, MD  06/09/2015  Medical  Oncology and Hematology [facility] [address] [place], [place] [address] Tel. [phone]    Fax. [phone]

## 2015-06-09 NOTE — Progress Notes (Signed)
Patient did not show up for nutrition appointment. 

## 2015-06-16 ENCOUNTER — Telehealth: Payer: Self-pay | Admitting: Oncology

## 2015-06-16 ENCOUNTER — Other Ambulatory Visit: Payer: Self-pay | Admitting: *Deleted

## 2015-06-16 ENCOUNTER — Ambulatory Visit (HOSPITAL_BASED_OUTPATIENT_CLINIC_OR_DEPARTMENT_OTHER): Payer: BLUE CROSS/BLUE SHIELD

## 2015-06-16 ENCOUNTER — Other Ambulatory Visit: Payer: Self-pay | Admitting: Oncology

## 2015-06-16 ENCOUNTER — Other Ambulatory Visit (HOSPITAL_BASED_OUTPATIENT_CLINIC_OR_DEPARTMENT_OTHER): Payer: BLUE CROSS/BLUE SHIELD

## 2015-06-16 ENCOUNTER — Ambulatory Visit (HOSPITAL_COMMUNITY)
Admission: RE | Admit: 2015-06-16 | Discharge: 2015-06-16 | Disposition: A | Discharge status: Home / Self Care | Payer: BLUE CROSS/BLUE SHIELD | Source: Ambulatory Visit | Attending: Oncology | Admitting: Oncology

## 2015-06-16 ENCOUNTER — Ambulatory Visit (HOSPITAL_BASED_OUTPATIENT_CLINIC_OR_DEPARTMENT_OTHER): Payer: BLUE CROSS/BLUE SHIELD | Admitting: Oncology

## 2015-06-16 VITALS — BP 110/70 | HR 98 | Temp 98.1°F | Resp 18 | Ht 67.5 in | Wt 137.7 lb

## 2015-06-16 DIAGNOSIS — C50411 Malignant neoplasm of upper-outer quadrant of right female breast: Secondary | ICD-10-CM

## 2015-06-16 DIAGNOSIS — C7801 Secondary malignant neoplasm of right lung: Secondary | ICD-10-CM

## 2015-06-16 DIAGNOSIS — Z171 Estrogen receptor negative status [ER-]: Secondary | ICD-10-CM

## 2015-06-16 DIAGNOSIS — C773 Secondary and unspecified malignant neoplasm of axilla and upper limb lymph nodes: Secondary | ICD-10-CM

## 2015-06-16 DIAGNOSIS — C7951 Secondary malignant neoplasm of bone: Secondary | ICD-10-CM | POA: Diagnosis not present

## 2015-06-16 DIAGNOSIS — C50911 Malignant neoplasm of unspecified site of right female breast: Secondary | ICD-10-CM

## 2015-06-16 DIAGNOSIS — C7802 Secondary malignant neoplasm of left lung: Secondary | ICD-10-CM

## 2015-06-16 DIAGNOSIS — R918 Other nonspecific abnormal finding of lung field: Secondary | ICD-10-CM | POA: Diagnosis not present

## 2015-06-16 DIAGNOSIS — C7931 Secondary malignant neoplasm of brain: Secondary | ICD-10-CM

## 2015-06-16 DIAGNOSIS — M25551 Pain in right hip: Secondary | ICD-10-CM | POA: Diagnosis not present

## 2015-06-16 DIAGNOSIS — C787 Secondary malignant neoplasm of liver and intrahepatic bile duct: Secondary | ICD-10-CM

## 2015-06-16 DIAGNOSIS — D72819 Decreased white blood cell count, unspecified: Secondary | ICD-10-CM

## 2015-06-16 LAB — COMPREHENSIVE METABOLIC PANEL
ALT: <9 U/L (ref 0–55)
ANION GAP: 10 meq/L (ref 3–11)
AST: 12 U/L (ref 5–34)
Albumin: 3.2 g/dL — ABNORMAL LOW (ref 3.5–5.0)
Alkaline Phosphatase: 98 U/L (ref 40–150)
BILIRUBIN TOTAL: 0.54 mg/dL (ref 0.20–1.20)
BUN: 12.1 mg/dL (ref 7.0–26.0)
CHLORIDE: 98 meq/L (ref 98–109)
CO2: 32 meq/L — AB (ref 22–29)
Calcium: 8.9 mg/dL (ref 8.4–10.4)
Creatinine: 0.6 mg/dL (ref 0.6–1.1)
GLUCOSE: 86 mg/dL (ref 70–140)
Potassium: 3.1 mEq/L — ABNORMAL LOW (ref 3.5–5.1)
SODIUM: 139 meq/L (ref 136–145)
TOTAL PROTEIN: 6.7 g/dL (ref 6.4–8.3)

## 2015-06-16 LAB — CBC WITH DIFFERENTIAL/PLATELET
BASO%: 0 % (ref 0.0–2.0)
Basophils Absolute: 0 10*3/uL (ref 0.0–0.1)
EOS ABS: 0 10*3/uL (ref 0.0–0.5)
EOS%: 0.3 % (ref 0.0–7.0)
HCT: 33 % — ABNORMAL LOW (ref 34.8–46.6)
HGB: 10.6 g/dL — ABNORMAL LOW (ref 11.6–15.9)
LYMPH%: 13.1 % — AB (ref 14.0–49.7)
MCH: 30.6 pg (ref 25.1–34.0)
MCHC: 32.1 g/dL (ref 31.5–36.0)
MCV: 95.4 fL (ref 79.5–101.0)
MONO#: 0.3 10*3/uL (ref 0.1–0.9)
MONO%: 8.7 % (ref 0.0–14.0)
NEUT%: 77.9 % — AB (ref 38.4–76.8)
NEUTROS ABS: 2.3 10*3/uL (ref 1.5–6.5)
PLATELETS: 179 10*3/uL (ref 145–400)
RBC: 3.46 10*6/uL — AB (ref 3.70–5.45)
RDW: 18.3 % — ABNORMAL HIGH (ref 11.2–14.5)
WBC: 2.9 10*3/uL — AB (ref 3.9–10.3)
lymph#: 0.4 10*3/uL — ABNORMAL LOW (ref 0.9–3.3)

## 2015-06-16 MED ORDER — DEXAMETHASONE 4 MG PO TABS: 8.0000 mg | ORAL_TABLET | Freq: Two times a day (BID) | ORAL | Status: DC

## 2015-06-16 MED ORDER — HEPARIN SOD (PORK) LOCK FLUSH 100 UNIT/ML IV SOLN
500.0000 [IU] | Freq: Once | INTRAVENOUS | Status: AC
  Administered 2015-06-16: 500 [IU] via INTRAVENOUS
  Filled 2015-06-16: qty 5

## 2015-06-16 MED ORDER — SODIUM CHLORIDE 0.9 % IJ SOLN
10.0000 mL | Freq: Once | INTRAMUSCULAR | Status: AC
  Administered 2015-06-16: 10 mL via INTRAVENOUS
  Filled 2015-06-16: qty 10

## 2015-06-16 MED ORDER — SODIUM CHLORIDE 0.9 % IV SOLN
INTRAVENOUS | Status: DC
  Administered 2015-06-16: 11:00:00 via INTRAVENOUS

## 2015-06-16 NOTE — Telephone Encounter (Signed)
md visit added per pof and patient will get an avs in chemo

## 2015-06-16 NOTE — Progress Notes (Signed)
And a Patient ID: Nicole Valentine, female   DOB: 10/02/1965, 50 y.o.   MRN: [id number] ID: Nicole Valentine OB: 11-23-1965  MR#: [id number]  XMI#:[id number]  PCP: [name], MD GYN:   SU: [name]); [name] OTHER MD: [name], [name], [name], [name], [name]  CHIEF COMPLAINT:  Stage IV triple negative breast cancer  CURRENT TREATMENT:  cisplatin  BREAST CANCER HISTORY: From Doctor Khan's intake notes 11/10/2012:  "She originally palpated a right breast mass. She had a mammogram performed that showed dense breasts bilaterally. Ultrasound of the right breast showed 2.3 x 1.9 cm area of abnormality with multiple abnormal lymph nodes. MRI of the bilateral breasts showed asymmetrical enhancement throughout the right breast consistent with multicentric disease. An ultrasound-guided biopsy performed. The known area of disease measured 1.9 x 1.9 x 2.3 cm. Multiple abnormal positive right axillary lymph nodes were noted. 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 Biopsy of the right axillary lymph node was positive for malignancy with extracapsular extension. Patient was originally seen by Dr. [name] Dr. [name] and Dr. [name]. She has elected to have a right mastectomy eventually and declined biopsies of any other areas within the breast."  She went on to receive neoadjuvant chemotherapy and attained a complete pathologic response, as documented below  METASTATIC DISEASE: From the prior summary note: Nicole Valentine noted a very small right supraclavicular mass 09/25/2014, which she brought to our attention. She was having some allergy symptoms at the time so we decided to reevaluate this after a few weeks and on 10/22/2014 as the mass persisted we obtained a restaging neck CT scan, 10/28/2014. There was bilateral supraclavicular adenopathy, but also a large right pleural effusion was  incidentally noted. We proceeded to right thoracentesis 10/29/2014, and a liter of hazy yellow fluid was removed. Cytology from this procedure ([id number]) showed malignant cells consistent with adenocarcinoma, estrogen and progesterone receptor negative, HER-2 not amplified, with a signals ratio 1.12 and the number per cell being 2.75, and with an MIB-1 of 80%. I called Nicole Valentine with these results and set her up for a PET scan performed 11/06/2014, which shows widespread metastatic disease involving particularly the right lung, liver, and bones, but also the left lung, right adrenal, and multiple lymph node areas.  Her subsequent history is as detailed below.  INTERVAL HISTORY: Nicole Valentine returns today for follow-up of her  Metastatic triple negative breast cancer.  Today is day 21 cycle 1 of cisplatin, given every 21 days. She receives IV fluids today, then she gets cycle 2 tomorrow followed by 2 more days of intravenous fluids .  REVIEW OF S/YSTEMS: Nicole Valentine is keeping to her new diet, which includes 2 beers a day. She says the family is making phone of her but in fact she has maintained her weight. She uses oxygen only at night and in the morning she usually finds that its off. She overdid it yesterday and today she is having a  Shooting pain in her mid back which occurs with deep breathing or certain positions, and also pain in her right hip. Basically she is "too busy". She needs to up her stool softeners she says but bowel movements remain regular. She did denies headache, her vision is good, and she has had no nausea or vomiting problems. There is been no cough or phlegm production , and no problems with her urine. A detailed review of systems today  was otherwise stable.  PAST MEDICAL HISTORY: Past Medical History  Diagnosis Date  . Complication of anesthesia     her father and uncle both had very difficult time waking up-was  something they told them may be hereditory. she will find out.  [name]  Hypothyroidism   . 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 Radiation 01/21/13-03/06/13    Right Breast  . Breast cancer ([place]) dx'd 04-10-12-rt    PAST SURGICAL HISTORY: Past Surgical History  Procedure Laterality Date  . Wisdom tooth extraction    . Portacath placement  04/22/2012    Procedure: INSERTION PORT-A-CATH;  Surgeon: [name], MD;  Location: [facility];  Service: General;  Laterality: Left;  [name] Modified mastectomy Right 11/18/2012    Procedure:  RIGHT MODIFIED MASTECTOMY;  Surgeon: [name], MD;  Location: [facility];  Service: General;  Laterality: Right;  . Knee arthroscopy Right 11/26/2013  . Port-a-cath removal Left 12/10/2013    Procedure: MINOR REMOVAL PORT-A-CATH;  Surgeon: [name], MD;  Location: [facility];  Service: General;  Laterality: Left;    FAMILY HISTORY Family History  Problem Relation Age of Onset  . Lung cancer Maternal Grandfather 75    smoker  . Prostate cancer Maternal Grandfather 90  . Throat cancer Other     Great Aunt x 2  . Liver cancer Other     Maternal Great Grandmother  . Melanoma Maternal Uncle 81  . Brain cancer Cousin 11    non-malignant  . Allergic rhinitis Neg Hx   . Asthma Neg Hx   . Eczema Neg Hx   . Urticaria Neg Hx   the patient's parents are living, in their early 75s. The patient has 2 brothers, no sisters. The patient's mother was diagnosed with breast cancer, HER-2 positive, in 2014 in [place].  GYNECOLOGIC HISTORY:   (Updated 10/03/2013) Menarche age 53, first live birth age 70. She is GX P4. LMP January 2014. 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  The children are currently ages 8, 32, 75, and 107. Her husband, Nicole Valentine, is a Customer service manager.He just started a job for [employer].  They attend the [employer] DIRECTIVES: The patient's  husband is her HCPOA   HEALTH MAINTENANCE:  (Updated 10/03/2013) Social History  Substance Use Topics  . Smoking status: Never Smoker   . Smokeless tobacco: Never Used  . Alcohol Use: No     Colonoscopy: Never  PAP: November 2013/Dr. Smith  Bone density: January 2015, [facility], normal  Lipid panel:  Not on file  Allergies  Allergen Reactions  . Almond Meal Rash  . Almond Oil Rash  . Other Rash    LOTIONS-unknown type  . Tegaderm Ag Mesh [Silver] Rash  . Vancomycin Rash    Red Man Syndrome    Current Outpatient Prescriptions  Medication Sig Dispense Refill  . cholecalciferol (VITAMIN D) 1000 UNITS tablet Take 2,000 Units by mouth 2 (two) times daily.     [name] dexamethasone (DECADRON) 4 MG tablet Take 2 tablets (8 mg total) by mouth 2 (two) times daily with a meal. Starting on day 2 of treatment. Take for 3 days, then stop. 30 tablet 1  . dextromethorphan-guaiFENesin (MUCINEX DM) 30-600 MG 12hr tablet Take 1 tablet by mouth 2 (two) times daily as needed for cough.    [name]  furosemide (LASIX) 20 MG tablet Take 1 tablet (20 mg total) by mouth daily. 30 tablet 4  . levothyroxine (SYNTHROID, LEVOTHROID) 175 MCG tablet Take 175 mcg by mouth daily before breakfast.    . loratadine-pseudoephedrine (CLARITIN-D 12-HOUR) 5-120 MG tablet Take 1 tablet by mouth 2 (two) times daily.    [name] LORazepam (ATIVAN) 0.5 MG tablet Take 1 tablet (0.5 mg total) by mouth at bedtime as needed (Nausea or vomiting). 60 tablet 2  . metoCLOPramide (REGLAN) 5 MG tablet Take 1 tablet (5 mg total) by mouth every 6 (six) hours as needed for nausea. 30 tablet 2  . naproxen sodium (ALEVE) 220 MG tablet Take 440 mg by mouth 2 (two) times daily with a meal. Reported on 05/07/2015    . ondansetron (ZOFRAN) 8 MG tablet Take 1 tablet (8 mg total) by mouth 2 (two) times daily as needed for nausea or vomiting. Start on day 3 of treatment if necessary 30 tablet 2  . pantoprazole (PROTONIX) 40 MG tablet Take 1 tablet (40 mg total) by  mouth daily at 12 noon. 30 tablet 2  . potassium chloride (MICRO-K) 10 MEQ CR capsule Take 2 capsules (20 mEq total) by mouth 2 (two) times daily. 60 capsule 3  . saccharomyces boulardii (FLORASTOR) 250 MG capsule Take 1 capsule (250 mg total) by mouth 2 (two) times daily.     No current facility-administered medications for this visit.      OBJECTIVE: Middle-aged white woman who appears stated age  73 Vitals:   06/16/15 0958  BP: 110/70  Pulse: 98  Temp: 98.1 F (36.7 C)  Resp: 18     Body mass index is 21.24 kg/(m^2).    ECOG FS:1 - Symptomatic but completely ambulatory Filed Weights   06/16/15 0958  Weight: 137 lb 11.2 oz (62.46 kg)    Sclerae unicteric, pupils round and equal Oropharynx clear and moist-- no thrush or other lesions No cervical or supraclavicular adenopathy Lungs no rales or rhonchi Heart regular rate and rhythm Abd soft, nontender, positive bowel sounds MSK no focal spinal tenderness, no upper extremity lymphedema Neuro: nonfocal, well oriented, appropriate affect Breasts:  deferred  LAB RESULTS:   Lab Results  Component Value Date   WBC 2.9* 06/16/2015   NEUTROABS 2.3 06/16/2015   HGB 10.6* 06/16/2015   HCT 33.0* 06/16/2015   MCV 95.4 06/16/2015   PLT 179 06/16/2015      Chemistry      Component Value Date/Time   NA 137 06/09/2015 1056   NA 137 04/21/2015 0546   K 3.7 06/09/2015 1056   K 3.5 04/21/2015 0546   CL 101 04/21/2015 0546   CL 101 10/18/2012 0859   CO2 34* 06/09/2015 1056   CO2 28 04/21/2015 0546   BUN 9.4 06/09/2015 1056   BUN 10 04/21/2015 0546   CREATININE 0.6 06/09/2015 1056   CREATININE 0.48 04/21/2015 0546      Component Value Date/Time   CALCIUM 9.2 06/09/2015 1056   CALCIUM 8.3* 04/21/2015 0546   ALKPHOS 112 06/09/2015 1056   ALKPHOS 94 04/21/2015 0546   AST 16 06/09/2015 1056   AST 17 04/21/2015 0546   ALT 10 06/09/2015 1056   ALT 10* 04/21/2015 0546   BILITOT 0.40 06/09/2015 1056   BILITOT 0.6  04/21/2015 0546       STUDIES: Dg Thoracic Spine 2 View  06/16/2015  CLINICAL DATA:  Worsening mid upper back pain over the last few days. History of breast cancer. EXAM: THORACIC  SPINE 2 VIEWS COMPARISON:  Chest x-ray 04/30/2015 FINDINGS: Left Port-A-Cath remains in place, unchanged. Stable pleural/parenchymal density in the right lower lung. Postoperative changes on the right with surgical clips in the right hilar region. No acute bony abnormality. No fracture or malalignment. Disc spaces are maintained. IMPRESSION: No acute bony abnormality. Stable postoperative changes and pleural/ parenchymal opacities in the right lower lung. Electronically Signed   By: Rolm Baptise M.D.   On: 06/16/2015 11:03   Dg Hip Unilat With Pelvis 1v Right  06/16/2015  CLINICAL DATA:  Worsening RIGHT hip pain increased with movement, metastatic breast cancer, increased worker round house EXAM: DG HIP (WITH OR WITHOUT PELVIS) 1V RIGHT COMPARISON:  None ; correlation CT pelvis 04/19/2015 FINDINGS: Hip and SI joint spaces preserved. Osseous mineralization grossly normal. No acute fracture, dislocation or bone destruction. IMPRESSION: No definite acute osseous abnormalities. If patient has persistent symptoms, consider MR or radionuclide bone scan assessment to assess for occult osseous metastasis. Electronically Signed   By: Lavonia Dana M.D.   On: 06/16/2015 11:06    ASSESSMENT: 50 y.o. BRCA negative [place] woman with triple-negative stage IV breast cancer  (1) an status post right breast upper outer quadrant and right axillary lymph node biopsy 04/04/2012, both positive for an invasive ductal carcinoma, grade 3, triple negative, with an MIB-1 of 25%  (2) Treated neoadjuvantly with  (a) fluorouracil, cyclophosphamide, and epirubicin (at 100 mg/M2) x4 completed 06/07/2012  (b) docetaxel (75 mg/M2) for one dose, 06/21/2012, poorly tolerated  (c) carboplatin and gemcitabine given every 21 days for 6 cycles completed  10/11/2012  (3) status post right modified radical mastectomy 11/18/2012 showing a complete pathologic response--all 16 lymph nodes were benign  (a) no plans for reconstructon  (4) adjuvant radiation therapy completed 03/06/2013  METASTATIC DISEASE June 2016 (5) 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, 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  (6) staging PET scan 11/06/2014 showed significant disease in the middle and lower lobes of the Right lung, Right effusion, multiple liver and bone lesions, as well as left lung nodules, a Right adrenal nodule, and widespread hypermetabolic adenopathy  (a) brain MRI 11/16/2014 showed multiple cerebellar lesions  (7) RADIATION TREATMENTS thoracic metastases  (a) 01/28/2015-02/11/2015: Thoracic Spine T8-10, 30 Gy in 10 fractions  (7) abraxane day 1 and day 8 of each 21 day cycle started 11/09/2014: last dose 03/12/2015 (6 cycles)  (a) PET scan 01/05/2015 shows an excellent initial response after 3 cycles  (b) PET scan 03/17/2015 shows progression  (8) zolendronate started 11/16/2014, to be repeated every 12 weeks, most recent dose 02/09/2015  (9) Foundation 1 study sent  11/06/2014: patient's sister in law Garvin Fila following [phone])  (a) 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[place], NTRK1, MYC, ARID1A and LYN, none w approved therapies  (b) pazopanib, ponatinib or crizotinib suggested as possible off-protocol options  (10) RADIATION TREATMENTS: Brain metastases:  (a) s/p SRS therapy 12/11/2014 to 4 right cerebellar lesions  (b) multiple (18+) lesions noted on repeat brain MRI 03/16/2015  (c) whole brain irradiation completed 04/12/2015  (11) uncontrolled pain: Started on OxyContin 10 mg twice a day with oxycodone 5 mg as needed 03/26/2015  (12) Right pleural effusion  (a) s/p thoracentesis July 2016 (x2), 03/30/2015  (13) started eribulin11/29/2016, originally planned to be given day one and day 8 of each 21 day cycle, but  switched to every 14 days with onpro support because of persistent leukopenia. Discontinued 05/25/14, after 2 cycles, w no evidence of response  (14) cisplatin started  05/28/2015, to be repeated every 21 days  PLAN:  [place]  Is considerably improved after only one cycle of cisplatin, which she tolerated moderately well. She will receive her second cycle tomorrow. After the third cycle we will formally restage, but I am anticipating good news.   I went ahead and obtain plain films of her right hip in her thoracic spine and luckily there is no dislocation or fracture or erosion. We don't see any evidence of tumor there. I suspect she "did too much" yesterday, and I would anticipate the problem will improve over the next couple of days.  I have added a visit next week, but not the week following. She will receive fluids however days 2 and 3 after each cisplatin treatment.  She knows to call for any problems that may develop before her next visit here.    Chauncey Cruel, MD  06/16/2015    Medical Oncology and Hematology [facility] [address] [place], [place] [address] Tel. [phone]    Fax. [phone]

## 2015-06-16 NOTE — Patient Instructions (Signed)

## 2015-06-17 ENCOUNTER — Ambulatory Visit (HOSPITAL_BASED_OUTPATIENT_CLINIC_OR_DEPARTMENT_OTHER): Payer: BLUE CROSS/BLUE SHIELD

## 2015-06-17 VITALS — BP 118/70 | HR 85 | Temp 98.6°F | Resp 18

## 2015-06-17 DIAGNOSIS — Z5111 Encounter for antineoplastic chemotherapy: Secondary | ICD-10-CM | POA: Diagnosis not present

## 2015-06-17 DIAGNOSIS — C7931 Secondary malignant neoplasm of brain: Secondary | ICD-10-CM | POA: Diagnosis not present

## 2015-06-17 DIAGNOSIS — C50411 Malignant neoplasm of upper-outer quadrant of right female breast: Secondary | ICD-10-CM | POA: Diagnosis not present

## 2015-06-17 DIAGNOSIS — K668 Other specified disorders of peritoneum: Secondary | ICD-10-CM

## 2015-06-17 DIAGNOSIS — C787 Secondary malignant neoplasm of liver and intrahepatic bile duct: Secondary | ICD-10-CM

## 2015-06-17 DIAGNOSIS — C50911 Malignant neoplasm of unspecified site of right female breast: Secondary | ICD-10-CM

## 2015-06-17 DIAGNOSIS — C773 Secondary and unspecified malignant neoplasm of axilla and upper limb lymph nodes: Secondary | ICD-10-CM | POA: Diagnosis not present

## 2015-06-17 DIAGNOSIS — C7801 Secondary malignant neoplasm of right lung: Secondary | ICD-10-CM

## 2015-06-17 DIAGNOSIS — C7802 Secondary malignant neoplasm of left lung: Secondary | ICD-10-CM

## 2015-06-17 DIAGNOSIS — C7951 Secondary malignant neoplasm of bone: Secondary | ICD-10-CM

## 2015-06-17 MED ORDER — SODIUM CHLORIDE 0.9 % IV SOLN
75.0000 mg/m2 | Freq: Once | INTRAVENOUS | Status: AC
  Administered 2015-06-17: 140 mg via INTRAVENOUS
  Filled 2015-06-17: qty 140

## 2015-06-17 MED ORDER — PALONOSETRON HCL INJECTION 0.25 MG/5ML
0.2500 mg | Freq: Once | INTRAVENOUS | Status: AC
  Administered 2015-06-17: 0.25 mg via INTRAVENOUS

## 2015-06-17 MED ORDER — SODIUM CHLORIDE 0.9 % IV SOLN
Freq: Once | INTRAVENOUS | Status: AC
  Administered 2015-06-17: 14:00:00 via INTRAVENOUS
  Filled 2015-06-17: qty 5

## 2015-06-17 MED ORDER — HEPARIN SOD (PORK) LOCK FLUSH 100 UNIT/ML IV SOLN
500.0000 [IU] | Freq: Once | INTRAVENOUS | Status: AC | PRN
  Administered 2015-06-17: 500 [IU]
  Filled 2015-06-17: qty 5

## 2015-06-17 MED ORDER — SODIUM CHLORIDE 0.9 % IV SOLN
Freq: Once | INTRAVENOUS | Status: AC
  Administered 2015-06-17: 12:00:00 via INTRAVENOUS

## 2015-06-17 MED ORDER — SODIUM CHLORIDE 0.9 % IJ SOLN
10.0000 mL | INTRAMUSCULAR | Status: DC | PRN
  Administered 2015-06-17: 10 mL
  Filled 2015-06-17: qty 10

## 2015-06-17 MED ORDER — PALONOSETRON HCL INJECTION 0.25 MG/5ML
INTRAVENOUS | Status: AC
  Filled 2015-06-17: qty 5

## 2015-06-17 MED ORDER — POTASSIUM CHLORIDE 2 MEQ/ML IV SOLN
Freq: Once | INTRAVENOUS | Status: AC
  Administered 2015-06-17: 12:00:00 via INTRAVENOUS
  Filled 2015-06-17: qty 10

## 2015-06-17 NOTE — Patient Instructions (Signed)
[facility] Discharge Instructions for Patients Receiving Chemotherapy  Today you received the following chemotherapy agents Cisplatin To help prevent nausea and vomiting after your treatment, we encourage you to take your nausea medication as prescribed.    If you develop nausea and vomiting that is not controlled by your nausea medication, call the clinic.   BELOW ARE SYMPTOMS THAT SHOULD BE REPORTED IMMEDIATELY:  *FEVER GREATER THAN 100.5 F  *CHILLS WITH OR WITHOUT FEVER  NAUSEA AND VOMITING THAT IS NOT CONTROLLED WITH YOUR NAUSEA MEDICATION  *UNUSUAL SHORTNESS OF BREATH  *UNUSUAL BRUISING OR BLEEDING  TENDERNESS IN MOUTH AND THROAT WITH OR WITHOUT PRESENCE OF ULCERS  *URINARY PROBLEMS  *BOWEL PROBLEMS  UNUSUAL RASH Items with * indicate a potential emergency and should be followed up as soon as possible.  Feel free to call the clinic you have any questions or concerns. The clinic phone number is [phone].  Please show the CHEMO ALERT CARD at check-in to the Emergency Department and triage nurse.   

## 2015-06-18 ENCOUNTER — Ambulatory Visit (HOSPITAL_BASED_OUTPATIENT_CLINIC_OR_DEPARTMENT_OTHER): Payer: BLUE CROSS/BLUE SHIELD

## 2015-06-18 ENCOUNTER — Other Ambulatory Visit: Payer: Self-pay | Admitting: *Deleted

## 2015-06-18 VITALS — BP 150/80 | HR 73 | Temp 97.8°F | Resp 18

## 2015-06-18 DIAGNOSIS — C7931 Secondary malignant neoplasm of brain: Secondary | ICD-10-CM

## 2015-06-18 DIAGNOSIS — C7802 Secondary malignant neoplasm of left lung: Secondary | ICD-10-CM

## 2015-06-18 DIAGNOSIS — C7951 Secondary malignant neoplasm of bone: Secondary | ICD-10-CM

## 2015-06-18 DIAGNOSIS — C773 Secondary and unspecified malignant neoplasm of axilla and upper limb lymph nodes: Secondary | ICD-10-CM

## 2015-06-18 DIAGNOSIS — C7801 Secondary malignant neoplasm of right lung: Secondary | ICD-10-CM

## 2015-06-18 DIAGNOSIS — C787 Secondary malignant neoplasm of liver and intrahepatic bile duct: Secondary | ICD-10-CM

## 2015-06-18 DIAGNOSIS — C50411 Malignant neoplasm of upper-outer quadrant of right female breast: Secondary | ICD-10-CM

## 2015-06-18 MED ORDER — SODIUM CHLORIDE 0.9 % IV SOLN
Freq: Once | INTRAVENOUS | Status: AC
  Administered 2015-06-18: 11:00:00 via INTRAVENOUS

## 2015-06-18 MED ORDER — FLUCONAZOLE 100 MG PO TABS: 100.0000 mg | ORAL_TABLET | Freq: Every day | ORAL | Status: DC

## 2015-06-18 NOTE — Progress Notes (Signed)
Patient states she noticed she was unable to brush the white coating off of her tongue this morning and was instructed by Dr. [name] to keep an eye on it and let him know if it worsens. Dr. [name] notified. Val, RN to e-scribe prescription for Diflucan. Patient verbalized understanding.

## 2015-06-18 NOTE — Patient Instructions (Signed)

## 2015-06-24 ENCOUNTER — Ambulatory Visit (HOSPITAL_BASED_OUTPATIENT_CLINIC_OR_DEPARTMENT_OTHER): Payer: BLUE CROSS/BLUE SHIELD | Admitting: Oncology

## 2015-06-24 ENCOUNTER — Ambulatory Visit (HOSPITAL_BASED_OUTPATIENT_CLINIC_OR_DEPARTMENT_OTHER): Payer: BLUE CROSS/BLUE SHIELD

## 2015-06-24 ENCOUNTER — Telehealth: Payer: Self-pay | Admitting: Nurse Practitioner

## 2015-06-24 VITALS — BP 115/67 | HR 87 | Temp 97.6°F | Resp 17 | Ht 67.5 in | Wt 133.7 lb

## 2015-06-24 DIAGNOSIS — C7951 Secondary malignant neoplasm of bone: Secondary | ICD-10-CM | POA: Diagnosis not present

## 2015-06-24 DIAGNOSIS — C7802 Secondary malignant neoplasm of left lung: Secondary | ICD-10-CM

## 2015-06-24 DIAGNOSIS — Z79899 Other long term (current) drug therapy: Secondary | ICD-10-CM | POA: Diagnosis not present

## 2015-06-24 DIAGNOSIS — C7801 Secondary malignant neoplasm of right lung: Secondary | ICD-10-CM

## 2015-06-24 DIAGNOSIS — C50911 Malignant neoplasm of unspecified site of right female breast: Secondary | ICD-10-CM

## 2015-06-24 DIAGNOSIS — C50411 Malignant neoplasm of upper-outer quadrant of right female breast: Secondary | ICD-10-CM

## 2015-06-24 DIAGNOSIS — C7931 Secondary malignant neoplasm of brain: Secondary | ICD-10-CM

## 2015-06-24 DIAGNOSIS — C778 Secondary and unspecified malignant neoplasm of lymph nodes of multiple regions: Secondary | ICD-10-CM

## 2015-06-24 DIAGNOSIS — C7971 Secondary malignant neoplasm of right adrenal gland: Secondary | ICD-10-CM

## 2015-06-24 DIAGNOSIS — C787 Secondary malignant neoplasm of liver and intrahepatic bile duct: Secondary | ICD-10-CM

## 2015-06-24 LAB — COMPREHENSIVE METABOLIC PANEL
AST: 12 U/L (ref 5–34)
Albumin: 3.1 g/dL — ABNORMAL LOW (ref 3.5–5.0)
Alkaline Phosphatase: 95 U/L (ref 40–150)
Anion Gap: 11 mEq/L (ref 3–11)
BILIRUBIN TOTAL: 0.33 mg/dL (ref 0.20–1.20)
BUN: 17.2 mg/dL (ref 7.0–26.0)
CO2: 32 meq/L — AB (ref 22–29)
CREATININE: 0.6 mg/dL (ref 0.6–1.1)
Calcium: 8.8 mg/dL (ref 8.4–10.4)
Chloride: 95 mEq/L — ABNORMAL LOW (ref 98–109)
GLUCOSE: 92 mg/dL (ref 70–140)
Potassium: 3.2 mEq/L — ABNORMAL LOW (ref 3.5–5.1)
SODIUM: 137 meq/L (ref 136–145)
TOTAL PROTEIN: 6.4 g/dL (ref 6.4–8.3)

## 2015-06-24 LAB — CBC WITH DIFFERENTIAL/PLATELET
BASO%: 0 % (ref 0.0–2.0)
Basophils Absolute: 0 10*3/uL (ref 0.0–0.1)
EOS%: 0.7 % (ref 0.0–7.0)
Eosinophils Absolute: 0 10*3/uL (ref 0.0–0.5)
HCT: 33.8 % — ABNORMAL LOW (ref 34.8–46.6)
HGB: 11.2 g/dL — ABNORMAL LOW (ref 11.6–15.9)
LYMPH%: 11.1 % — AB (ref 14.0–49.7)
MCH: 31.3 pg (ref 25.1–34.0)
MCHC: 33.1 g/dL (ref 31.5–36.0)
MCV: 94.4 fL (ref 79.5–101.0)
MONO#: 0.2 10*3/uL (ref 0.1–0.9)
MONO%: 5.9 % (ref 0.0–14.0)
NEUT%: 82.3 % — ABNORMAL HIGH (ref 38.4–76.8)
NEUTROS ABS: 2.5 10*3/uL (ref 1.5–6.5)
Platelets: 144 10*3/uL — ABNORMAL LOW (ref 145–400)
RBC: 3.58 10*6/uL — AB (ref 3.70–5.45)
RDW: 18.7 % — AB (ref 11.2–14.5)
WBC: 3.1 10*3/uL — AB (ref 3.9–10.3)
lymph#: 0.3 10*3/uL — ABNORMAL LOW (ref 0.9–3.3)

## 2015-06-24 LAB — TSH: TSH: 5.875 m(IU)/L — ABNORMAL HIGH (ref 0.308–3.960)

## 2015-06-24 NOTE — Progress Notes (Signed)
And a Patient ID: Nicole Valentine, female   DOB: 1966-03-06, 50 y.o.   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 She had a mammogram performed that showed dense breasts bilaterally. Ultrasound of the right breast showed 2.3 x 1.9 cm area of abnormality with multiple abnormal lymph nodes. MRI of the bilateral breasts showed asymmetrical enhancement throughout the right breast consistent with multicentric disease. An ultrasound-guided biopsy performed. The known area of disease measured 1.9 x 1.9 x 2.3 cm. Multiple abnormal positive right axillary lymph nodes were noted. 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 Biopsy of the right axillary lymph node was positive for malignancy with extracapsular extension. Patient was originally seen by Dr. [name] Dr. [name] and Dr. [name]. She has elected to have a right mastectomy eventually and declined biopsies of any other areas within the breast."  She went on to receive neoadjuvant chemotherapy and attained a complete pathologic response, as documented below  METASTATIC DISEASE: From the prior summary note: Nicole Valentine noted a very small right supraclavicular mass 09/25/2014, which she brought to our attention. She was having some allergy symptoms at the time so we decided to reevaluate this after a few weeks and on 10/22/2014 as the mass persisted we obtained a restaging neck CT scan, 10/28/2014. There was bilateral supraclavicular adenopathy, but also a large right pleural effusion was  incidentally noted. We proceeded to right thoracentesis 10/29/2014, and a liter of hazy yellow fluid was removed. Cytology from this procedure ([id number]) showed malignant cells consistent with adenocarcinoma, estrogen and progesterone receptor negative, HER-2 not amplified, with a signals ratio 1.12 and the number per cell being 2.75, and with an MIB-1 of 80%. I called Nicole Valentine with these results and set her up for a PET scan performed 11/06/2014, which shows widespread metastatic disease involving particularly the right lung, liver, and bones, but also the left lung, right adrenal, and multiple lymph node areas.  Her subsequent history is as detailed below.  INTERVAL HISTORY: Nicole Valentine returns today for follow-up of her stage IV triple negative breast cancer.  Today is day 8 cycle 2 of cisplatin, given every 21 days. She receives IV fluids the day before and the day after each cisplatin treatment. For nausea all she is using currently is dexamethasone 8 mg twice a day on days 2 and 3. .  REVIEW OF S/YSTEMS: Nicole Valentine continues to improve clinically. Her peripheral edema resolved after her first dose and she is no longer requiring any Lasix. She is now able to sleep flat, which she absolutely could not do before. She is trying to pick up weight and she has 2 beers a day in addition to 5 meals. She is concentrating on carbohydrates, and her taste is generally well preserved except for sweets, which she tolerates poorly. She has had no headaches, dizziness, or visual changes. She does feel tired particularly days 4 and 5 which is when she comes all first urates. She goes to the gym 5 days a week and in addition to walking is doing light weights. The pain in her  right hip resolved and then came back when she was moving a box with her leg. She thinks her right leg may be a little bit weaker than her left. The pain in her back resolved. We did take films of those 2 areas last visit and they were unremarkable. She  has had no significant cough, no phlegm production, no pleurisy, and no change in bowel or bladder habits. A detailed review of systems today was otherwise noncontributory  PAST MEDICAL HISTORY: Past Medical History  Diagnosis Date  . Complication of anesthesia     her father and uncle both had very difficult time waking up-was  something they told them may be hereditory. she will find out.  [name] Hypothyroidism   . 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 Radiation 01/21/13-03/06/13    Right Breast  . Breast cancer ([place]) dx'd 04-10-12-rt    PAST SURGICAL HISTORY: Past Surgical History  Procedure Laterality Date  . Wisdom tooth extraction    . Portacath placement  04/22/2012    Procedure: INSERTION PORT-A-CATH;  Surgeon: [name], MD;  Location: [facility];  Service: General;  Laterality: Left;  [name] Modified mastectomy Right 11/18/2012    Procedure:  RIGHT MODIFIED MASTECTOMY;  Surgeon: [name], MD;  Location: [facility];  Service: General;  Laterality: Right;  . Knee arthroscopy Right 11/26/2013  . Port-a-cath removal Left 12/10/2013    Procedure: MINOR REMOVAL PORT-A-CATH;  Surgeon: [name], MD;  Location: [facility];  Service: General;  Laterality: Left;    FAMILY HISTORY Family History  Problem Relation Age of Onset  . Lung cancer Maternal Grandfather 28    smoker  . Prostate cancer Maternal Grandfather 90  . Throat cancer Other     Great Aunt x 2  . Liver cancer Other     Maternal Great Grandmother  . Melanoma Maternal Uncle 81  . Brain cancer Cousin 11    non-malignant  . Allergic rhinitis Neg Hx   . Asthma Neg Hx   . Eczema Neg Hx   . Urticaria Neg Hx   the patient's parents are living, in their early 28s. The patient has 2 brothers, no sisters. The patient's mother was diagnosed with breast cancer, HER-2 positive, in 2014 in [place].  GYNECOLOGIC HISTORY:   (Updated 10/03/2013) Menarche age 36, first live  birth age 82. She is GX P4. LMP January 2014. 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  The children are currently ages 49, 31, 39, and 48. Her husband, [name], is a Customer service manager.He just started a job for [employer].  They attend the [employer] DIRECTIVES: The patient's husband is her HCPOA   HEALTH MAINTENANCE:  (Updated 10/03/2013) Social History  Substance Use Topics  . Smoking status: Never Smoker   . Smokeless tobacco: Never Used  . Alcohol Use: No     Colonoscopy: Never  PAP: November 2013/Dr. Smith  Bone density: January 2015, [facility], normal  Lipid panel:  Not on file  Allergies  Allergen Reactions  . Almond Meal Rash  . Almond Oil Rash  . Other Rash    LOTIONS-unknown type  . Tegaderm Ag Mesh [Silver] Rash  . Vancomycin Rash    Red Man Syndrome    Current Outpatient Prescriptions  Medication Sig Dispense Refill  . cholecalciferol (VITAMIN  D) 1000 UNITS tablet Take 2,000 Units by mouth 2 (two) times daily.     [name] dexamethasone (DECADRON) 4 MG tablet Take 2 tablets (8 mg total) by mouth 2 (two) times daily with a meal. Starting on day 2 of treatment. Take for 3 days, then stop. 30 tablet 1  . dextromethorphan-guaiFENesin (MUCINEX DM) 30-600 MG 12hr tablet Take 1 tablet by mouth 2 (two) times daily as needed for cough.    . fluconazole (DIFLUCAN) 100 MG tablet Take 1 tablet (100 mg total) by mouth daily. 7 tablet 1  . furosemide (LASIX) 20 MG tablet Take 1 tablet (20 mg total) by mouth daily. 30 tablet 4  . levothyroxine (SYNTHROID, LEVOTHROID) 175 MCG tablet Take 175 mcg by mouth daily before breakfast.    . loratadine-pseudoephedrine (CLARITIN-D 12-HOUR) 5-120 MG tablet Take 1 tablet by mouth 2 (two) times daily.    [name] LORazepam (ATIVAN) 0.5 MG tablet Take 1 tablet (0.5 mg total) by mouth at bedtime as needed (Nausea or vomiting). 60 tablet 2  .  metoCLOPramide (REGLAN) 5 MG tablet Take 1 tablet (5 mg total) by mouth every 6 (six) hours as needed for nausea. 30 tablet 2  . naproxen sodium (ALEVE) 220 MG tablet Take 440 mg by mouth 2 (two) times daily with a meal. Reported on 05/07/2015    . ondansetron (ZOFRAN) 8 MG tablet Take 1 tablet (8 mg total) by mouth 2 (two) times daily as needed for nausea or vomiting. Start on day 3 of treatment if necessary 30 tablet 2  . pantoprazole (PROTONIX) 40 MG tablet Take 1 tablet (40 mg total) by mouth daily at 12 noon. 30 tablet 2  . potassium chloride (MICRO-K) 10 MEQ CR capsule Take 2 capsules (20 mEq total) by mouth 2 (two) times daily. 60 capsule 3  . saccharomyces boulardii (FLORASTOR) 250 MG capsule Take 1 capsule (250 mg total) by mouth 2 (two) times daily.     No current facility-administered medications for this visit.      OBJECTIVE: Middle-aged white woman in no acute distress  Filed Vitals:   06/24/15 1022  BP: 115/67  Pulse: 87  Temp: 97.6 F (36.4 C)  Resp: 17     Body mass index is 20.62 kg/(m^2).    ECOG FS:1 - Symptomatic but completely ambulatory Filed Weights   06/24/15 1022  Weight: 133 lb 11.2 oz (60.646 kg)    Sclerae unicteric, EOMs intact Oropharynx clear, dentition in good repair No cervical or supraclavicular adenopathy Lungs no rales or rhonchi Heart regular rate and rhythm Abd soft, nontender, positive bowel sounds MSK no focal spinal tenderness, no upper extremity lymphedema Neuro: nonfocal, well oriented, appropriate affect Breasts: Deferred   LAB RESULTS:   Lab Results  Component Value Date   WBC 2.9* 06/16/2015   NEUTROABS 2.3 06/16/2015   HGB 10.6* 06/16/2015   HCT 33.0* 06/16/2015   MCV 95.4 06/16/2015   PLT 179 06/16/2015      Chemistry      Component Value Date/Time   NA 139 06/16/2015 0949   NA 137 04/21/2015 0546   K 3.1* 06/16/2015 0949   K 3.5 04/21/2015 0546   CL 101 04/21/2015 0546   CL 101 10/18/2012 0859   CO2 32*  06/16/2015 0949   CO2 28 04/21/2015 0546   BUN 12.1 06/16/2015 0949   BUN 10 04/21/2015 0546   CREATININE 0.6 06/16/2015 0949   CREATININE 0.48 04/21/2015 0546      Component Value Date/Time  CALCIUM 8.9 06/16/2015 0949   CALCIUM 8.3* 04/21/2015 0546   ALKPHOS 98 06/16/2015 0949   ALKPHOS 94 04/21/2015 0546   AST 12 06/16/2015 0949   AST 17 04/21/2015 0546   ALT <9 06/16/2015 0949   ALT 10* 04/21/2015 0546   BILITOT 0.54 06/16/2015 0949   BILITOT 0.6 04/21/2015 0546       STUDIES: Dg Thoracic Spine 2 View  06/16/2015  CLINICAL DATA:  Worsening mid upper back pain over the last few days. History of breast cancer. EXAM: THORACIC SPINE 2 VIEWS COMPARISON:  Chest x-ray 04/30/2015 FINDINGS: Left Port-A-Cath remains in place, unchanged. Stable pleural/parenchymal density in the right lower lung. Postoperative changes on the right with surgical clips in the right hilar region. No acute bony abnormality. No fracture or malalignment. Disc spaces are maintained. IMPRESSION: No acute bony abnormality. Stable postoperative changes and pleural/ parenchymal opacities in the right lower lung. Electronically Signed   By: Rolm Baptise M.D.   On: 06/16/2015 11:03   Dg Hip Unilat With Pelvis 1v Right  06/16/2015  CLINICAL DATA:  Worsening RIGHT hip pain increased with movement, metastatic breast cancer, increased worker round house EXAM: DG HIP (WITH OR WITHOUT PELVIS) 1V RIGHT COMPARISON:  None ; correlation CT pelvis 04/19/2015 FINDINGS: Hip and SI joint spaces preserved. Osseous mineralization grossly normal. No acute fracture, dislocation or bone destruction. IMPRESSION: No definite acute osseous abnormalities. If patient has persistent symptoms, consider MR or radionuclide bone scan assessment to assess for occult osseous metastasis. Electronically Signed   By: Lavonia Dana M.D.   On: 06/16/2015 11:06    ASSESSMENT: 50 y.o. BRCA negative [place] woman with triple-negative stage IV breast  cancer  (1) an status post right breast upper outer quadrant and right axillary lymph node biopsy 04/04/2012, both positive for an invasive ductal carcinoma, grade 3, triple negative, with an MIB-1 of 25%  (2) Treated neoadjuvantly with  (a) fluorouracil, cyclophosphamide, and epirubicin (at 100 mg/M2) x4 completed 06/07/2012  (b) docetaxel (75 mg/M2) for one dose, 06/21/2012, poorly tolerated  (c) carboplatin and gemcitabine given every 21 days for 6 cycles completed 10/11/2012  (3) status post right modified radical mastectomy 11/18/2012 showing a complete pathologic response--all 16 lymph nodes were benign  (a) no plans for reconstructon  (4) adjuvant radiation therapy completed 03/06/2013  METASTATIC DISEASE June 2016 (5) 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, 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  (6) staging PET scan 11/06/2014 showed significant disease in the middle and lower lobes of the Right lung, Right effusion, multiple liver and bone lesions, as well as left lung nodules, a Right adrenal nodule, and widespread hypermetabolic adenopathy  (a) brain MRI 11/16/2014 showed multiple cerebellar lesions  (7) RADIATION TREATMENTS thoracic metastases  (a) 01/28/2015-02/11/2015: Thoracic Spine T8-10, 30 Gy in 10 fractions  (7) abraxane day 1 and day 8 of each 21 day cycle started 11/09/2014: last dose 03/12/2015 (6 cycles)  (a) PET scan 01/05/2015 shows an excellent initial response after 3 cycles  (b) PET scan 03/17/2015 shows progression  (8) zolendronate started 11/16/2014, to be repeated every 12 weeks, most recent dose 02/09/2015  (9) Foundation 1 study sent  11/06/2014: patient's sister in law Garvin Fila following [phone])  (a) 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Boyd, NTRK1, MYC, ARID1A and LYN, none w approved therapies  (b) pazopanib, ponatinib or crizotinib suggested as possible off-protocol options  (10) RADIATION TREATMENTS: Brain metastases:  (a)  s/p SRS therapy 12/11/2014 to 4 right cerebellar lesions  (b) multiple (18+)  lesions noted on repeat brain MRI 03/16/2015  (c) whole brain irradiation completed 04/12/2015  (11) uncontrolled pain: Started on OxyContin 10 mg twice a day with oxycodone 5 mg as needed 03/26/2015  (12) Right pleural effusion  (a) s/p thoracentesis July 2016 (x2), 03/30/2015  (13) started eribulin11/29/2016, originally planned to be given day one and day 8 of each 21 day cycle, but switched to every 14 days with onpro support because of persistent leukopenia. Discontinued 05/25/14, after 2 cycles, w no evidence of response  (14) cisplatin started 05/28/2015, to be repeated every 21 days  PLAN:  Sharin continues to show clinical improvement on the cisplatin. This is very favorable and I think when we restage her later this month we will be able to quantify that.  She is tolerating the treatment well. She does feel fatigued particularly days 4 and 5, which is when she comes off her steroids. I suggested she take 2 mg of dexamethasone the morning of days 4 and 5 and that little extension generally will do it.  She is doing a great job at trying to pick up some weight. It helps that her taste is not completely altered. She is going to the gym daily trying to build up muscle again.   Assuming the brain MRI and CT scan of the chest 2 weeks from now gives [place] good news, the plan I would follow would be for a total of 6 doses of cisplatin, after which she could enroll in the pembrolizumab study.  She will see me again in 2 weeks, with her third cycle of cisplatin. She knows to call for any problems that may develop before that visit.   Chauncey Cruel, MD  06/24/2015    Medical Oncology and Hematology [facility] [address] [place], [place] [address] Tel. [phone]    Fax. [phone]

## 2015-06-24 NOTE — Telephone Encounter (Signed)
Appointments made and states that she will view them on mychart

## 2015-07-01 ENCOUNTER — Encounter: Payer: Self-pay | Admitting: Genetic Counselor

## 2015-07-01 DIAGNOSIS — Z1379 Encounter for other screening for genetic and chromosomal anomalies: Secondary | ICD-10-CM | POA: Insufficient documentation

## 2015-07-02 ENCOUNTER — Ambulatory Visit
Admission: RE | Admit: 2015-07-02 | Discharge: 2015-07-02 | Disposition: A | Discharge status: Home / Self Care | Payer: BLUE CROSS/BLUE SHIELD | Source: Ambulatory Visit | Attending: Radiation Oncology | Admitting: Radiation Oncology

## 2015-07-02 DIAGNOSIS — C7949 Secondary malignant neoplasm of other parts of nervous system: Principal | ICD-10-CM

## 2015-07-02 DIAGNOSIS — C7931 Secondary malignant neoplasm of brain: Secondary | ICD-10-CM

## 2015-07-02 MED ORDER — GADOBENATE DIMEGLUMINE 529 MG/ML IV SOLN
12.0000 mL | Freq: Once | INTRAVENOUS | Status: AC | PRN
  Administered 2015-07-02: 12 mL via INTRAVENOUS

## 2015-07-05 ENCOUNTER — Encounter: Payer: Self-pay | Admitting: Radiation Oncology

## 2015-07-05 ENCOUNTER — Ambulatory Visit
Admission: RE | Admit: 2015-07-05 | Discharge: 2015-07-05 | Disposition: A | Discharge status: Home / Self Care | Payer: BLUE CROSS/BLUE SHIELD | Source: Ambulatory Visit | Attending: Radiation Oncology | Admitting: Radiation Oncology

## 2015-07-05 VITALS — BP 120/74 | HR 125 | Temp 98.7°F | Ht 67.0 in | Wt 130.1 lb

## 2015-07-05 DIAGNOSIS — C7931 Secondary malignant neoplasm of brain: Secondary | ICD-10-CM

## 2015-07-05 NOTE — Progress Notes (Signed)
Radiation Oncology         [phone] ________________________________  Name: Nicole Valentine MRN: [id number]  Date: 07/05/2015  DOB: 04-Mar-1966  Follow-Up Visit Note  Outpatient  CC: [name], MD  Consuela Mimes, MD  Summary of Radiation Treatment and Diagnosis:     ICD-9-CM ICD-10-CM  1. Brain metastases 198.3 C79.31   Radiation treatment dates:   03/25/2015-04/13/2015 Site/dose:   Whole brain/ 35 Gy in 14 fractions  Previous RT to other sites as well.  Narrative:  The patient returns today for routine follow-up  Tired, but tolerating Cisplatin well. Denies severe persistent sites of pain.  No new neurologic symptoms and breathing has improved. Her Brain MRI showed completed response to WBRT and was reviewed at tumor board today.   ALLERGIES:  is allergic to almond meal; almond oil; other; tegaderm ag mesh; and vancomycin.  Meds: Current Outpatient Prescriptions  Medication Sig Dispense Refill  . cholecalciferol (VITAMIN D) 1000 UNITS tablet Take 2,000 Units by mouth 2 (two) times daily.     [name] dexamethasone (DECADRON) 4 MG tablet Take 2 tablets (8 mg total) by mouth 2 (two) times daily with a meal. Starting on day 2 of treatment. Take for 3 days, then stop. 30 tablet 1  . dextromethorphan-guaiFENesin (MUCINEX DM) 30-600 MG 12hr tablet Take 1 tablet by mouth 2 (two) times daily as needed for cough.    . levothyroxine (SYNTHROID, LEVOTHROID) 175 MCG tablet Take 175 mcg by mouth daily before breakfast.    . loratadine-pseudoephedrine (CLARITIN-D 12-HOUR) 5-120 MG tablet Take 1 tablet by mouth 2 (two) times daily.    [name] LORazepam (ATIVAN) 0.5 MG tablet Take 1 tablet (0.5 mg total) by mouth at bedtime as needed (Nausea or vomiting). 60 tablet 2  . pantoprazole (PROTONIX) 40 MG tablet Take 1 tablet (40 mg total) by mouth daily at 12 noon. 30 tablet 2  . potassium chloride (MICRO-K) 10 MEQ CR capsule Take 2 capsules (20 mEq total) by mouth 2 (two) times daily. 60 capsule  3  . fluconazole (DIFLUCAN) 100 MG tablet Take 1 tablet (100 mg total) by mouth daily. (Patient not taking: Reported on 07/05/2015) 7 tablet 1   No current facility-administered medications for this encounter.    Physical Findings: The patient is in no acute distress. Patient is alert and oriented.  height is 5\' 7"  (1.702 m) and weight is 130 lb 1.6 oz (59.013 kg). Her temperature is 98.7 F (37.1 C). Her blood pressure is 120/74 and her pulse is 125. Her oxygen saturation is 96%. . Oral cavity without thrush. Neurologic Finger to nose test is intact.  Strength is symmetric throughout. Cranial nerves are grossly intact. Judgement and insight are intact. She is alert and oriented x3.  Ambulatory. KPS 80   Lab Findings: Lab Results  Component Value Date   WBC 3.1* 06/24/2015   HGB 11.2* 06/24/2015   HCT 33.8* 06/24/2015   MCV 94.4 06/24/2015   PLT 144* 06/24/2015     Radiographic Findings: Dg Thoracic Spine 2 View  06/16/2015  CLINICAL DATA:  Worsening mid upper back pain over the last few days. History of breast cancer. EXAM: THORACIC SPINE 2 VIEWS COMPARISON:  Chest x-ray 04/30/2015 FINDINGS: Left Port-A-Cath remains in place, unchanged. Stable pleural/parenchymal density in the right lower lung. Postoperative changes on the right with surgical clips in the right hilar region. No acute bony abnormality. No fracture or malalignment. Disc spaces are maintained. IMPRESSION: No acute bony abnormality. Stable postoperative changes and pleural/  parenchymal opacities in the right lower lung. Electronically Signed   By: Rolm Baptise M.D.   On: 06/16/2015 11:03   Mr Jeri Cos [id number] Contrast  07/02/2015  CLINICAL DATA:  50 year old female with metastatic breast cancer status post brain SRS in July 2016, and subsequent whole brain radiation in November 2016. Subsequent encounter. EXAM: MRI HEAD WITHOUT AND WITH CONTRAST TECHNIQUE: Multiplanar, multiecho pulse sequences of the brain and surrounding  structures were obtained without and with intravenous contrast. CONTRAST:  56mL MULTIHANCE GADOBENATE DIMEGLUMINE 529 MG/ML IV SOLN COMPARISON:  03/16/2015 and earlier. FINDINGS: Previously seen numerous small enhancing cerebellar metastases have resolved on post-contrast imaging. Bilateral enhancing cerebral metastases demonstrated in October also appear resolved, including the gyriform left posterior frontal lobe lesion (would beyond series 10, image 115 today). No abnormal enhancement of the brain today. No dural thickening identified. No restricted diffusion to suggest acute infarction. No midline shift, mass effect, evidence of mass lesion, ventriculomegaly, extra-axial collection or acute intracranial hemorrhage. Cervicomedullary junction and pituitary are within normal limits. Subtle interval cerebral volume loss. Major intracranial vascular flow voids are stable and within normal limits. No abnormal gray or white matter signal today. No chronic cerebral blood products. Negative visualized cervical spinal cord. Stable to improved skull and visualized cervical spine bone marrow signal, no destructive osseous lesion identified. Visible internal auditory structures appear normal. There are mild bilateral mastoid effusions now. Negative nasopharynx. Trace paranasal sinus mucosal thickening is stable. Orbit and scalp soft tissues are stable. IMPRESSION: 1. Numerous small brain metastases appear completely resolved following whole brain radiation. 2. Mild mastoid effusions. Electronically Signed   By: Genevie Ann M.D.   On: 07/02/2015 10:44   Dg Hip Unilat With Pelvis 1v Right  06/16/2015  CLINICAL DATA:  Worsening RIGHT hip pain increased with movement, metastatic breast cancer, increased worker round house EXAM: DG HIP (WITH OR WITHOUT PELVIS) 1V RIGHT COMPARISON:  None ; correlation CT pelvis 04/19/2015 FINDINGS: Hip and SI joint spaces preserved. Osseous mineralization grossly normal. No acute fracture,  dislocation or bone destruction. IMPRESSION: No definite acute osseous abnormalities. If patient has persistent symptoms, consider MR or radionuclide bone scan assessment to assess for occult osseous metastasis. Electronically Signed   By: Lavonia Dana M.D.   On: 06/16/2015 11:06    Impression/Plan: Brain metastases showed complete response WBRT. She is very happy to hear this.  We will schedule her next brain MRI for 3 months from now.  We will follow up after that MRI to discuss the results.  She knows to call sooner, if needed.   _____________________________________   Eppie Gibson, MD

## 2015-07-05 NOTE — Progress Notes (Signed)
Nicole Valentine is here for follow up of radiation completed 04/13/15 to her Whole Brain. She reports fatigue, but is receiving chemotherapy currently, she has received 3 cycles of Cisplatin, and the plan is for 5 more cycles. She does complain of pain at her Left Axilla which she reports feels like someone is pushing on it. She reports it is intermittent. She reports her breathing is better and is not using oxygen any more, this occurred after her first cycle of chemotherapy. She is here to have a recent MRI read, and other concerns at this time.   BP 120/74 mmHg  Pulse 125  Temp(Src) 98.7 F (37.1 C)  Ht 5\' 7"  (1.702 m)  Wt 130 lb 1.6 oz (59.013 kg)  BMI 20.37 kg/m2  SpO2 96%  LMP 03/29/2012   Wt Readings from Last 3 Encounters:  07/05/15 130 lb 1.6 oz (59.013 kg)  06/24/15 133 lb 11.2 oz (60.646 kg)  06/16/15 137 lb 11.2 oz (62.46 kg)

## 2015-07-07 ENCOUNTER — Ambulatory Visit (HOSPITAL_BASED_OUTPATIENT_CLINIC_OR_DEPARTMENT_OTHER): Payer: BLUE CROSS/BLUE SHIELD

## 2015-07-07 ENCOUNTER — Other Ambulatory Visit: Payer: Self-pay

## 2015-07-07 VITALS — BP 125/77 | HR 93 | Temp 98.0°F | Resp 18

## 2015-07-07 DIAGNOSIS — C50911 Malignant neoplasm of unspecified site of right female breast: Secondary | ICD-10-CM

## 2015-07-07 DIAGNOSIS — C787 Secondary malignant neoplasm of liver and intrahepatic bile duct: Secondary | ICD-10-CM

## 2015-07-07 DIAGNOSIS — C7951 Secondary malignant neoplasm of bone: Secondary | ICD-10-CM | POA: Diagnosis not present

## 2015-07-07 DIAGNOSIS — C773 Secondary and unspecified malignant neoplasm of axilla and upper limb lymph nodes: Secondary | ICD-10-CM | POA: Diagnosis not present

## 2015-07-07 DIAGNOSIS — E860 Dehydration: Secondary | ICD-10-CM

## 2015-07-07 DIAGNOSIS — C50411 Malignant neoplasm of upper-outer quadrant of right female breast: Secondary | ICD-10-CM

## 2015-07-07 DIAGNOSIS — C7801 Secondary malignant neoplasm of right lung: Secondary | ICD-10-CM

## 2015-07-07 DIAGNOSIS — C7931 Secondary malignant neoplasm of brain: Secondary | ICD-10-CM

## 2015-07-07 DIAGNOSIS — C7802 Secondary malignant neoplasm of left lung: Secondary | ICD-10-CM

## 2015-07-07 MED ORDER — SODIUM CHLORIDE 0.9% FLUSH
10.0000 mL | INTRAVENOUS | Status: DC | PRN
  Administered 2015-07-07: 10 mL via INTRAVENOUS
  Filled 2015-07-07: qty 10

## 2015-07-07 MED ORDER — SODIUM CHLORIDE 0.9 % IV SOLN
INTRAVENOUS | Status: DC
  Administered 2015-07-07: 10:00:00 via INTRAVENOUS

## 2015-07-07 MED ORDER — HEPARIN SOD (PORK) LOCK FLUSH 100 UNIT/ML IV SOLN
500.0000 [IU] | Freq: Once | INTRAVENOUS | Status: AC
  Administered 2015-07-07: 500 [IU] via INTRAVENOUS
  Filled 2015-07-07: qty 5

## 2015-07-07 NOTE — Progress Notes (Signed)
Darden Dates, RN called to report that patient is in infusion for IVF and that she has a pulse rate of 114.  Orders placed per Dr. [name] and he is aware of her pulse rate.

## 2015-07-07 NOTE — Patient Instructions (Signed)

## 2015-07-08 ENCOUNTER — Ambulatory Visit (HOSPITAL_BASED_OUTPATIENT_CLINIC_OR_DEPARTMENT_OTHER): Payer: BLUE CROSS/BLUE SHIELD

## 2015-07-08 ENCOUNTER — Encounter: Payer: Self-pay | Admitting: Radiation Therapy

## 2015-07-08 ENCOUNTER — Other Ambulatory Visit: Payer: Self-pay | Admitting: Radiation Therapy

## 2015-07-08 ENCOUNTER — Ambulatory Visit: Payer: BLUE CROSS/BLUE SHIELD | Admitting: Nutrition

## 2015-07-08 ENCOUNTER — Other Ambulatory Visit (HOSPITAL_BASED_OUTPATIENT_CLINIC_OR_DEPARTMENT_OTHER): Payer: BLUE CROSS/BLUE SHIELD

## 2015-07-08 ENCOUNTER — Ambulatory Visit (HOSPITAL_BASED_OUTPATIENT_CLINIC_OR_DEPARTMENT_OTHER): Payer: BLUE CROSS/BLUE SHIELD | Admitting: Oncology

## 2015-07-08 VITALS — BP 120/69 | HR 114 | Temp 97.8°F | Resp 18 | Ht 67.0 in | Wt 131.4 lb

## 2015-07-08 DIAGNOSIS — C50411 Malignant neoplasm of upper-outer quadrant of right female breast: Secondary | ICD-10-CM

## 2015-07-08 DIAGNOSIS — C7951 Secondary malignant neoplasm of bone: Secondary | ICD-10-CM

## 2015-07-08 DIAGNOSIS — Z5111 Encounter for antineoplastic chemotherapy: Secondary | ICD-10-CM

## 2015-07-08 DIAGNOSIS — C7931 Secondary malignant neoplasm of brain: Secondary | ICD-10-CM

## 2015-07-08 DIAGNOSIS — C778 Secondary and unspecified malignant neoplasm of lymph nodes of multiple regions: Secondary | ICD-10-CM

## 2015-07-08 DIAGNOSIS — C7801 Secondary malignant neoplasm of right lung: Secondary | ICD-10-CM

## 2015-07-08 DIAGNOSIS — C787 Secondary malignant neoplasm of liver and intrahepatic bile duct: Secondary | ICD-10-CM

## 2015-07-08 DIAGNOSIS — C7802 Secondary malignant neoplasm of left lung: Secondary | ICD-10-CM

## 2015-07-08 DIAGNOSIS — C50911 Malignant neoplasm of unspecified site of right female breast: Secondary | ICD-10-CM

## 2015-07-08 DIAGNOSIS — C7971 Secondary malignant neoplasm of right adrenal gland: Secondary | ICD-10-CM

## 2015-07-08 DIAGNOSIS — K668 Other specified disorders of peritoneum: Secondary | ICD-10-CM

## 2015-07-08 DIAGNOSIS — Z171 Estrogen receptor negative status [ER-]: Secondary | ICD-10-CM

## 2015-07-08 LAB — CBC WITH DIFFERENTIAL/PLATELET
BASO%: 0.5 % (ref 0.0–2.0)
BASOS ABS: 0 10*3/uL (ref 0.0–0.1)
EOS%: 0.9 % (ref 0.0–7.0)
Eosinophils Absolute: 0 10*3/uL (ref 0.0–0.5)
HEMATOCRIT: 32 % — AB (ref 34.8–46.6)
HGB: 10.6 g/dL — ABNORMAL LOW (ref 11.6–15.9)
LYMPH#: 0.2 10*3/uL — AB (ref 0.9–3.3)
LYMPH%: 11.3 % — AB (ref 14.0–49.7)
MCH: 33 pg (ref 25.1–34.0)
MCHC: 33.2 g/dL (ref 31.5–36.0)
MCV: 99.4 fL (ref 79.5–101.0)
MONO#: 0.2 10*3/uL (ref 0.1–0.9)
MONO%: 14.8 % — ABNORMAL HIGH (ref 0.0–14.0)
NEUT#: 1.1 10*3/uL — ABNORMAL LOW (ref 1.5–6.5)
NEUT%: 72.5 % (ref 38.4–76.8)
PLATELETS: 181 10*3/uL (ref 145–400)
RBC: 3.22 10*6/uL — ABNORMAL LOW (ref 3.70–5.45)
RDW: 22.3 % — ABNORMAL HIGH (ref 11.2–14.5)
WBC: 1.5 10*3/uL — ABNORMAL LOW (ref 3.9–10.3)

## 2015-07-08 LAB — COMPREHENSIVE METABOLIC PANEL
ALT: <9 U/L (ref 0–55)
ANION GAP: 8 meq/L (ref 3–11)
AST: 16 U/L (ref 5–34)
Albumin: 3 g/dL — ABNORMAL LOW (ref 3.5–5.0)
Alkaline Phosphatase: 111 U/L (ref 40–150)
BUN: 12.3 mg/dL (ref 7.0–26.0)
CALCIUM: 9.1 mg/dL (ref 8.4–10.4)
CHLORIDE: 103 meq/L (ref 98–109)
CO2: 29 mEq/L (ref 22–29)
Creatinine: 0.6 mg/dL (ref 0.6–1.1)
EGFR: >90 mL/min/{1.73_m2} (ref >90)
Glucose: 99 mg/dl (ref 70–140)
POTASSIUM: 4.4 meq/L (ref 3.5–5.1)
Sodium: 139 mEq/L (ref 136–145)
Total Bilirubin: 0.47 mg/dL (ref 0.20–1.20)
Total Protein: 6.7 g/dL (ref 6.4–8.3)

## 2015-07-08 MED ORDER — SODIUM CHLORIDE 0.9 % IV SOLN
75.0000 mg/m2 | Freq: Once | INTRAVENOUS | Status: AC
  Administered 2015-07-08: 140 mg via INTRAVENOUS
  Filled 2015-07-08: qty 140

## 2015-07-08 MED ORDER — POTASSIUM CHLORIDE 2 MEQ/ML IV SOLN
Freq: Once | INTRAVENOUS | Status: AC
  Administered 2015-07-08: 10:00:00 via INTRAVENOUS
  Filled 2015-07-08: qty 10

## 2015-07-08 MED ORDER — SODIUM CHLORIDE 0.9 % IV SOLN
Freq: Once | INTRAVENOUS | Status: AC
  Administered 2015-07-08: 10:00:00 via INTRAVENOUS

## 2015-07-08 MED ORDER — PALONOSETRON HCL INJECTION 0.25 MG/5ML
0.2500 mg | Freq: Once | INTRAVENOUS | Status: AC
  Administered 2015-07-08: 0.25 mg via INTRAVENOUS

## 2015-07-08 MED ORDER — HEPARIN SOD (PORK) LOCK FLUSH 100 UNIT/ML IV SOLN
500.0000 [IU] | Freq: Once | INTRAVENOUS | Status: DC | PRN
  Filled 2015-07-08: qty 5

## 2015-07-08 MED ORDER — SODIUM CHLORIDE 0.9 % IV SOLN
Freq: Once | INTRAVENOUS | Status: AC
  Administered 2015-07-08: 14:00:00 via INTRAVENOUS
  Filled 2015-07-08: qty 5

## 2015-07-08 MED ORDER — PALONOSETRON HCL INJECTION 0.25 MG/5ML
INTRAVENOUS | Status: AC
  Filled 2015-07-08: qty 5

## 2015-07-08 MED ORDER — AZITHROMYCIN 1 G PO PACK: 1.0000 g | PACK | Freq: Once | ORAL | Status: DC

## 2015-07-08 MED ORDER — SODIUM CHLORIDE 0.9 % IJ SOLN
10.0000 mL | INTRAMUSCULAR | Status: DC | PRN
Start: 2015-07-08 — End: 2015-07-08
  Filled 2015-07-08: qty 10

## 2015-07-08 NOTE — Progress Notes (Signed)
Brief nutrition consult completed with patient during infusion at patient request. Patient reports difficulty gaining weight. Reports 30-35 pound weight loss. She reports an intolerance to sweet foods. She has questions on how to increase overall calorie intake.  Educated patient on meals and snacks with increased calories and protein and provided fact sheet on increasing calories and protein. Provided recipes and suggestions for ways patient can reduce sweetness of foods. Questions were answered.  Teach back method was used and contact information was provided

## 2015-07-08 NOTE — Progress Notes (Signed)
And a Patient ID: Nicole Valentine, female   DOB: [birth year], 50 y.o.   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 She had a mammogram performed that showed dense breasts bilaterally. Ultrasound of the right breast showed 2.3 x 1.9 cm area of abnormality with multiple abnormal lymph nodes. MRI of the bilateral breasts showed asymmetrical enhancement throughout the right breast consistent with multicentric disease. An ultrasound-guided biopsy performed. The known area of disease measured 1.9 x 1.9 x 2.3 cm. Multiple abnormal positive right axillary lymph nodes were noted. 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 Biopsy of the right axillary lymph node was positive for malignancy with extracapsular extension. Patient was originally seen by Dr. [name] Dr. [name] and Dr. [name]. She has elected to have a right mastectomy eventually and declined biopsies of any other areas within the breast."  She went on to receive neoadjuvant chemotherapy and attained a complete pathologic response, as documented below  METASTATIC DISEASE: From the prior summary note: Nicole Valentine noted a very small right supraclavicular mass 09/25/2014, which she brought to our attention. She was having some allergy symptoms at the time so we decided to reevaluate this after a few weeks and on 10/22/2014 as the mass persisted we obtained a restaging neck CT scan, 10/28/2014. There was bilateral supraclavicular adenopathy, but also a large right pleural effusion was  incidentally noted. We proceeded to right thoracentesis 10/29/2014, and a liter of hazy yellow fluid was removed. Cytology from this procedure ([id number]) showed malignant cells consistent with adenocarcinoma, estrogen and progesterone receptor negative, HER-2 not amplified, with a signals ratio 1.12 and the number per cell being 2.75, and with an MIB-1 of 80%. I called Nicole Valentine with these results and set her up for a PET scan performed 11/06/2014, which shows widespread metastatic disease involving particularly the right lung, liver, and bones, but also the left lung, right adrenal, and multiple lymph node areas.  Her subsequent history is as detailed below.  INTERVAL HISTORY: [name] returns today for follow-up of her metastatic triple negative breast cancer.  Today is day 1 cycle 3 of cisplatin, given every 21 days. She generally feels fairly tired for 3 or 4 days afterwards, then except study. We are giving her fluids on day 0 and a 2. She thinks that may help a little. Primarily though that is to protect your kidneys from dehydration-- she has maintained an excellent creatinine. She also had a restaging brain MRI which shows complete resolution of her brain lesions.   REVIEW OF S/YSTEMS: [name] is a little bit of a sinus problem, with mild headaches, postnasal drip, and a little bit of a cough. Her breathing however continues improved. She is able to lie flat now which she was not able to do before. Her appetite is a little bit better and her sense of taste is fine at present. She still not gaining weight however. She is taking walks either outside or around the house, doing some housework, shopping, cooking, and driving. She sometimes gets tired and rarely takes a nap. She sleeps okay  at night. The discomfort in her right hip  is pretty much gone although she occasionally still has some groin soreness. We obtained some plain films of the pelvis and hip and there were no fractures or dislocations. A  detailed review of systems today was otherwise stable  PAST MEDICAL HISTORY: Past Medical History  Diagnosis Date  . Complication of anesthesia     her father and uncle both had very difficult time waking up-was  something they told them may be hereditory. she will find out.  [name] Hypothyroidism   . 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 Radiation 01/21/13-03/06/13    Right Breast  . Breast cancer ([place]) dx'd 04-10-12-rt    PAST SURGICAL HISTORY: Past Surgical History  Procedure Laterality Date  . Wisdom tooth extraction    . Portacath placement  04/22/2012    Procedure: INSERTION PORT-A-CATH;  Surgeon: [name], MD;  Location: [facility];  Service: General;  Laterality: Left;  [name] Modified mastectomy Right 11/18/2012    Procedure:  RIGHT MODIFIED MASTECTOMY;  Surgeon: [name], MD;  Location: [facility];  Service: General;  Laterality: Right;  . Knee arthroscopy Right 11/26/2013  . Port-a-cath removal Left 12/10/2013    Procedure: MINOR REMOVAL PORT-A-CATH;  Surgeon: [name], MD;  Location: [facility];  Service: General;  Laterality: Left;    FAMILY HISTORY Family History  Problem Relation Age of Onset  . Lung cancer Maternal Grandfather 70    smoker  . Prostate cancer Maternal Grandfather 90  . Throat cancer Other     Great Aunt x 2  . Liver cancer Other     Maternal Great Grandmother  . Melanoma Maternal Uncle 81  . Brain cancer Cousin 11    non-malignant  . Allergic rhinitis Neg Hx   . Asthma Neg Hx   . Eczema Neg Hx   . Urticaria Neg Hx   the patient's parents are living, in their early 26s. The patient has 2 brothers, no sisters. The patient's mother was diagnosed with breast cancer, HER-2 positive, in 2014 in [place].  GYNECOLOGIC HISTORY:   (Updated 10/03/2013) Menarche age 45, first live birth age 12. She is GX P4. LMP January 2014. Periods stopped with chemotherapy and have not resumed  SOCIAL  HISTORY:   (Updated 10/03/2013) Nicole Valentine 2 of her 4 children.  The children are currently ages 5, 8, 51, and 52. Her husband, Nicole Valentine, is a Customer service manager.He just started a job for [employer].  They attend the [employer] DIRECTIVES: The patient's husband is her HCPOA   HEALTH MAINTENANCE:  (Updated 10/03/2013) Social History  Substance Use Topics  . Smoking status: Never Smoker   . Smokeless tobacco: Never Used  . Alcohol Use: No     Colonoscopy: Never  PAP: November 2013/Dr. Smith  Bone density: January 2015, [facility], normal  Lipid panel:  Not on file  Allergies  Allergen Reactions  . Almond Meal Rash  . Almond Oil Rash  . Other Rash    LOTIONS-unknown type  . Tegaderm Ag Mesh [Silver] Rash  . Vancomycin Rash    Red Man Syndrome    Current Outpatient Prescriptions  Medication Sig Dispense Refill  . azithromycin (ZITHROMAX) 1 g powder Take 1 packet (1 g total) by mouth once. 1 each 0  . cholecalciferol (VITAMIN D) 1000 UNITS tablet Take 2,000 Units by mouth 2 (two) times  daily.     . dexamethasone (DECADRON) 4 MG tablet Take 2 tablets (8 mg total) by mouth 2 (two) times daily with a meal. Starting on day 2 of treatment. Take for 3 days, then stop. 30 tablet 1  . dextromethorphan-guaiFENesin (MUCINEX DM) 30-600 MG 12hr tablet Take 1 tablet by mouth 2 (two) times daily as needed for cough.    . fluconazole (DIFLUCAN) 100 MG tablet Take 1 tablet (100 mg total) by mouth daily. (Patient not taking: Reported on 07/05/2015) 7 tablet 1  . levothyroxine (SYNTHROID, LEVOTHROID) 175 MCG tablet Take 175 mcg by mouth daily before breakfast.    . loratadine-pseudoephedrine (CLARITIN-D 12-HOUR) 5-120 MG tablet Take 1 tablet by mouth 2 (two) times daily.    [name] LORazepam (ATIVAN) 0.5 MG tablet Take 1 tablet (0.5 mg total) by mouth at bedtime as needed (Nausea or vomiting). 60 tablet 2  . pantoprazole (PROTONIX) 40 MG tablet Take 1 tablet (40 mg total) by  mouth daily at 12 noon. 30 tablet 2  . potassium chloride (MICRO-K) 10 MEQ CR capsule Take 2 capsules (20 mEq total) by mouth 2 (two) times daily. 60 capsule 3   No current facility-administered medications for this visit.      OBJECTIVE: Middle-aged white woman who appears stated age  54 Vitals:   07/08/15 0848  BP: 120/69  Pulse: 114  Temp: 97.8 F (36.6 C)  Resp: 18     Body mass index is 20.58 kg/(m^2).    ECOG FS:1 - Symptomatic but completely ambulatory Filed Weights   07/08/15 0848  Weight: 131 lb 6.4 oz (59.603 kg)    Sclerae unicteric, pupils round and equal Oropharynx clear, slightly dry No cervical or supraclavicular adenopathy Lungs no rales or rhonchi Heart regular rate and rhythm Abd soft, nontender, positive bowel sounds MSK no focal spinal tenderness, no upper extremity lymphedema Neuro: nonfocal, well oriented, appropriate affect Breasts: Deferred   LAB RESULTS:   Lab Results  Component Value Date   WBC 1.5* 07/08/2015   NEUTROABS 1.1* 07/08/2015   HGB 10.6* 07/08/2015   HCT 32.0* 07/08/2015   MCV 99.4 07/08/2015   PLT 181 07/08/2015      Chemistry      Component Value Date/Time   NA 139 07/08/2015 0815   NA 137 04/21/2015 0546   K 4.4 07/08/2015 0815   K 3.5 04/21/2015 0546   CL 101 04/21/2015 0546   CL 101 10/18/2012 0859   CO2 29 07/08/2015 0815   CO2 28 04/21/2015 0546   BUN 12.3 07/08/2015 0815   BUN 10 04/21/2015 0546   CREATININE 0.6 07/08/2015 0815   CREATININE 0.48 04/21/2015 0546      Component Value Date/Time   CALCIUM 9.1 07/08/2015 0815   CALCIUM 8.3* 04/21/2015 0546   ALKPHOS 111 07/08/2015 0815   ALKPHOS 94 04/21/2015 0546   AST 16 07/08/2015 0815   AST 17 04/21/2015 0546   ALT <9 07/08/2015 0815   ALT 10* 04/21/2015 0546   BILITOT 0.47 07/08/2015 0815   BILITOT 0.6 04/21/2015 0546       STUDIES: Dg Thoracic Spine 2 View  06/16/2015  CLINICAL DATA:  Worsening mid upper back pain over the last few days.  History of breast cancer. EXAM: THORACIC SPINE 2 VIEWS COMPARISON:  Chest x-ray 04/30/2015 FINDINGS: Left Port-A-Cath remains in place, unchanged. Stable pleural/parenchymal density in the right lower lung. Postoperative changes on the right with surgical clips in the right hilar region. No acute bony abnormality. No  fracture or malalignment. Disc spaces are maintained. IMPRESSION: No acute bony abnormality. Stable postoperative changes and pleural/ parenchymal opacities in the right lower lung. Electronically Signed   By: Rolm Baptise M.D.   On: 06/16/2015 11:03   Mr Jeri Cos XI Contrast  07/02/2015  CLINICAL DATA:  50 year old female with metastatic breast cancer status post brain SRS in July 2016, and subsequent whole brain radiation in November 2016. Subsequent encounter. EXAM: MRI HEAD WITHOUT AND WITH CONTRAST TECHNIQUE: Multiplanar, multiecho pulse sequences of the brain and surrounding structures were obtained without and with intravenous contrast. CONTRAST:  15m MULTIHANCE GADOBENATE DIMEGLUMINE 529 MG/ML IV SOLN COMPARISON:  03/16/2015 and earlier. FINDINGS: Previously seen numerous small enhancing cerebellar metastases have resolved on post-contrast imaging. Bilateral enhancing cerebral metastases demonstrated in October also appear resolved, including the gyriform left posterior frontal lobe lesion (would beyond series 10, image 115 today). No abnormal enhancement of the brain today. No dural thickening identified. No restricted diffusion to suggest acute infarction. No midline shift, mass effect, evidence of mass lesion, ventriculomegaly, extra-axial collection or acute intracranial hemorrhage. Cervicomedullary junction and pituitary are within normal limits. Subtle interval cerebral volume loss. Major intracranial vascular flow voids are stable and within normal limits. No abnormal gray or white matter signal today. No chronic cerebral blood products. Negative visualized cervical spinal cord. Stable  to improved skull and visualized cervical spine bone marrow signal, no destructive osseous lesion identified. Visible internal auditory structures appear normal. There are mild bilateral mastoid effusions now. Negative nasopharynx. Trace paranasal sinus mucosal thickening is stable. Orbit and scalp soft tissues are stable. IMPRESSION: 1. Numerous small brain metastases appear completely resolved following whole brain radiation. 2. Mild mastoid effusions. Electronically Signed   By: HGenevie AnnM.D.   On: 07/02/2015 10:44   Dg Hip Unilat With Pelvis 1v Right  06/16/2015  CLINICAL DATA:  Worsening RIGHT hip pain increased with movement, metastatic breast cancer, increased worker round house EXAM: DG HIP (WITH OR WITHOUT PELVIS) 1V RIGHT COMPARISON:  None ; correlation CT pelvis 04/19/2015 FINDINGS: Hip and SI joint spaces preserved. Osseous mineralization grossly normal. No acute fracture, dislocation or bone destruction. IMPRESSION: No definite acute osseous abnormalities. If patient has persistent symptoms, consider MR or radionuclide bone scan assessment to assess for occult osseous metastasis. Electronically Signed   By: MLavonia DanaM.D.   On: 06/16/2015 11:06    ASSESSMENT: 50y.o. BRCA negative [place] woman with triple-negative stage IV breast cancer  (1) an status post right breast upper outer quadrant and right axillary lymph node biopsy 04/04/2012, both positive for an invasive ductal carcinoma, grade 3, triple negative, with an MIB-1 of 25%  (2) Treated neoadjuvantly with  (a) fluorouracil, cyclophosphamide, and epirubicin (at 100 mg/M2) x4 completed 06/07/2012  (b) docetaxel (75 mg/M2) for one dose, 06/21/2012, poorly tolerated  (c) carboplatin and gemcitabine given every 21 days for 6 cycles completed 10/11/2012  (3) status post right modified radical mastectomy 11/18/2012 showing a complete pathologic response--all 16 lymph nodes were benign  (a) no plans for reconstructon  (4) adjuvant  radiation therapy completed 03/06/2013  METASTATIC DISEASE June 2016 (5) 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, 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  (6) staging PET scan 11/06/2014 showed significant disease in the middle and lower lobes of the Right lung, Right effusion, multiple liver and bone lesions, as well as left lung nodules, a Right adrenal nodule, and widespread hypermetabolic adenopathy  (a) brain MRI 11/16/2014 showed multiple cerebellar lesions  (7)  RADIATION TREATMENTS thoracic metastases  (a) 01/28/2015-02/11/2015: Thoracic Spine T8-10, 30 Gy in 10 fractions  (7) abraxane day 1 and day 8 of each 21 day cycle started 11/09/2014: last dose 03/12/2015 (6 cycles)  (a) PET scan 01/05/2015 shows an excellent initial response after 3 cycles  (b) PET scan 03/17/2015 shows progression  (8) zolendronate started 11/16/2014, to be repeated every 12 weeks, most recent dose 02/09/2015  (9) Foundation 1 study sent  11/06/2014: patient's sister in law Garvin Fila following [id number])  (a) 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[place], NTRK1, MYC, ARID1A and LYN, none w approved therapies  (b) pazopanib, ponatinib or crizotinib suggested as possible off-protocol options  (10) RADIATION TREATMENTS: Brain metastases:  (a) s/p SRS therapy 12/11/2014 to 4 right cerebellar lesions  (b) multiple (18+) lesions noted on repeat brain MRI 03/16/2015  (c) whole brain irradiation completed 04/12/2015  (11) uncontrolled pain: Started on OxyContin 10 mg twice a day with oxycodone 5 mg as needed 03/26/2015  (12) Right pleural effusion  (a) s/p thoracentesis July 2016 (x2), 03/30/2015  (13) started eribulin11/29/2016, originally planned to be given day one and day 8 of each 21 day cycle, but switched to every 14 days with onpro support because of persistent leukopenia. Discontinued 05/25/14, after 2 cycles, w no evidence of response  (14) cisplatin started 05/28/2015, to be repeated every  21 days  PLAN:  Eliz's brain MRI is very favorable and I gave her a copy of the report today.  She is proceeding to her third cycle of cisplatin. We are beginning to see a drop in her counts and my feeling is were not going to be able to wish this drug much further. It certainly has improved her functional status, breathing, and other symptoms. We may want to return to it at some point in the future.  At this point however I would vote for switching her over to pembrolizumab. I believe she qualifies for the study. She is already scheduled for restaging CT scan of the chest next week and that will provide [place] with a new baseline. If there are other labs or studies that we need to obtain to facilitate her and rolling out of we can certainly proceed to those as needed.  I discussed the general mechanism of action of pembrolizumab and how it affects the immune system as well as the common and less common but more concerning complications that may develop from it. She is very interested in participating.  She will see [place] again next week. We should have a definite plan in place by then.  In the meantime even though she is not febrile I'm going to go ahead and put her on a Z-Pak. I think some of the upper respiratory symptoms and call if she is having are really due to a very early bacterial sinusitis and this should help clear that up. I have also added Neulasta to her day 2 treatment and encouraged her to take her potassium twice a day as ordered.   Chauncey Cruel, MD  07/08/2015    Medical Oncology and Hematology [facility] [address] [place], [place] [address] Tel. [phone]    Fax. [phone]

## 2015-07-08 NOTE — Progress Notes (Signed)
1. Do you need a wheel chair? no  2. On oxygen? no  3. Have you ever had any surgery in the body part being scanned? no  4. Have you ever had any surgery on your brain or heart? no    5. Have you ever had surgery on your eyes or ears? no   6. Do you have a pacemaker or defibrillator? no  7. Do you have a Neurostimulator? no  8. Claustrophobic? no  9. Any risk for metal in eyes? no  10. Injury by bullet, buckshot, or shrapnel? no  11. Stent? no   12. Hx of Cancer? Yes Breast with mets to bone and brain    13. Kidney or Liver disease? yes  14. Hx of Lupus, Rheumatoid Arthritis or Scleroderma? no  15. IV Antibiotics or long term use of NSAIDS? no  16. HX of Hypertension? no  17. Diabetes? no  18. Allergy to contrast? no  19. Recent labs. Will need labs drawn there prior to her scan

## 2015-07-09 ENCOUNTER — Ambulatory Visit: Payer: BLUE CROSS/BLUE SHIELD

## 2015-07-09 ENCOUNTER — Ambulatory Visit (HOSPITAL_BASED_OUTPATIENT_CLINIC_OR_DEPARTMENT_OTHER): Payer: BLUE CROSS/BLUE SHIELD

## 2015-07-09 VITALS — BP 129/79 | HR 99 | Temp 97.8°F | Resp 18

## 2015-07-09 DIAGNOSIS — C778 Secondary and unspecified malignant neoplasm of lymph nodes of multiple regions: Secondary | ICD-10-CM | POA: Diagnosis not present

## 2015-07-09 DIAGNOSIS — C7931 Secondary malignant neoplasm of brain: Secondary | ICD-10-CM

## 2015-07-09 DIAGNOSIS — C7802 Secondary malignant neoplasm of left lung: Secondary | ICD-10-CM

## 2015-07-09 DIAGNOSIS — K668 Other specified disorders of peritoneum: Secondary | ICD-10-CM

## 2015-07-09 DIAGNOSIS — Z5189 Encounter for other specified aftercare: Secondary | ICD-10-CM

## 2015-07-09 DIAGNOSIS — C7801 Secondary malignant neoplasm of right lung: Secondary | ICD-10-CM

## 2015-07-09 DIAGNOSIS — C50411 Malignant neoplasm of upper-outer quadrant of right female breast: Secondary | ICD-10-CM | POA: Diagnosis not present

## 2015-07-09 DIAGNOSIS — C7951 Secondary malignant neoplasm of bone: Secondary | ICD-10-CM

## 2015-07-09 DIAGNOSIS — C7971 Secondary malignant neoplasm of right adrenal gland: Secondary | ICD-10-CM

## 2015-07-09 DIAGNOSIS — E860 Dehydration: Secondary | ICD-10-CM

## 2015-07-09 DIAGNOSIS — C787 Secondary malignant neoplasm of liver and intrahepatic bile duct: Secondary | ICD-10-CM

## 2015-07-09 DIAGNOSIS — C50911 Malignant neoplasm of unspecified site of right female breast: Secondary | ICD-10-CM

## 2015-07-09 MED ORDER — OSELTAMIVIR PHOSPHATE 75 MG PO CAPS: 75.0000 mg | ORAL_CAPSULE | Freq: Every day | ORAL | Status: DC

## 2015-07-09 MED ORDER — PEGFILGRASTIM INJECTION 6 MG/0.6ML ~~LOC~~
6.0000 mg | PREFILLED_SYRINGE | Freq: Once | SUBCUTANEOUS | Status: AC
  Administered 2015-07-09: 6 mg via SUBCUTANEOUS
  Filled 2015-07-09: qty 0.6

## 2015-07-09 MED ORDER — SODIUM CHLORIDE 0.9 % IV SOLN
INTRAVENOUS | Status: DC
  Administered 2015-07-09: 10:00:00 via INTRAVENOUS

## 2015-07-09 MED ORDER — SODIUM CHLORIDE 0.9 % IV SOLN: INTRAVENOUS | Status: DC

## 2015-07-09 MED ORDER — HEPARIN SOD (PORK) LOCK FLUSH 100 UNIT/ML IV SOLN
500.0000 [IU] | Freq: Once | INTRAVENOUS | Status: AC
  Administered 2015-07-09: 500 [IU] via INTRAVENOUS
  Filled 2015-07-09: qty 5

## 2015-07-09 MED ORDER — SODIUM CHLORIDE 0.9% FLUSH
10.0000 mL | INTRAVENOUS | Status: DC | PRN
  Administered 2015-07-09: 10 mL via INTRAVENOUS
  Filled 2015-07-09: qty 10

## 2015-07-09 NOTE — Patient Instructions (Signed)

## 2015-07-09 NOTE — Progress Notes (Signed)
Patient's father has been diagnosed with influenza; and lives with the patient.  Patient denies any active flu-like symptoms whatsoever at the present time.  However, will prescribe Tamiflu 75 mg on a daily basis as a prophylactic dose in hopes of avoiding the influenza virus.  Patient was advised to call/return of her directly to the emergency department over the weekend if she develops any worsening symptoms whatsoever.

## 2015-07-09 NOTE — Progress Notes (Signed)
Patient states her father tested positive for the flu on 07/05/2015 while admitted in the hospital. Patient's father lives with her. Patient denies nausea, vomiting, chills, body aches, or fever. Selena Lesser, NP notified. States she will e-scribe tamiflu to patient's pharmacy. Will not swab for the flu as of now as patient is asymptomatic per Selena Lesser, NP.Patient verbalizes understanding to above mentioned plan.  Dr. [name] notified of above mentioned plan.

## 2015-07-13 ENCOUNTER — Other Ambulatory Visit: Payer: Self-pay | Admitting: Oncology

## 2015-07-13 ENCOUNTER — Other Ambulatory Visit: Payer: Self-pay | Admitting: *Deleted

## 2015-07-13 DIAGNOSIS — C50919 Malignant neoplasm of unspecified site of unspecified female breast: Secondary | ICD-10-CM

## 2015-07-13 DIAGNOSIS — C50411 Malignant neoplasm of upper-outer quadrant of right female breast: Secondary | ICD-10-CM

## 2015-07-13 DIAGNOSIS — C7931 Secondary malignant neoplasm of brain: Secondary | ICD-10-CM

## 2015-07-14 ENCOUNTER — Encounter (HOSPITAL_COMMUNITY): Payer: Self-pay

## 2015-07-14 ENCOUNTER — Encounter: Payer: Self-pay | Admitting: *Deleted

## 2015-07-14 ENCOUNTER — Ambulatory Visit (HOSPITAL_COMMUNITY)
Admission: RE | Admit: 2015-07-14 | Discharge: 2015-07-14 | Disposition: A | Discharge status: Home / Self Care | Payer: BLUE CROSS/BLUE SHIELD | Source: Ambulatory Visit | Attending: Nurse Practitioner | Admitting: Nurse Practitioner

## 2015-07-14 DIAGNOSIS — C787 Secondary malignant neoplasm of liver and intrahepatic bile duct: Secondary | ICD-10-CM | POA: Diagnosis not present

## 2015-07-14 DIAGNOSIS — C50411 Malignant neoplasm of upper-outer quadrant of right female breast: Secondary | ICD-10-CM

## 2015-07-14 DIAGNOSIS — C7931 Secondary malignant neoplasm of brain: Secondary | ICD-10-CM | POA: Diagnosis present

## 2015-07-14 DIAGNOSIS — R918 Other nonspecific abnormal finding of lung field: Secondary | ICD-10-CM | POA: Diagnosis not present

## 2015-07-14 DIAGNOSIS — R590 Localized enlarged lymph nodes: Secondary | ICD-10-CM | POA: Insufficient documentation

## 2015-07-14 DIAGNOSIS — J90 Pleural effusion, not elsewhere classified: Secondary | ICD-10-CM | POA: Diagnosis not present

## 2015-07-14 DIAGNOSIS — C7971 Secondary malignant neoplasm of right adrenal gland: Secondary | ICD-10-CM | POA: Insufficient documentation

## 2015-07-14 MED ORDER — IOHEXOL 300 MG/ML  SOLN
100.0000 mL | Freq: Once | INTRAMUSCULAR | Status: AC | PRN
  Administered 2015-07-14: 100 mL via INTRAVENOUS

## 2015-07-15 ENCOUNTER — Other Ambulatory Visit: Payer: Self-pay | Admitting: *Deleted

## 2015-07-15 ENCOUNTER — Telehealth: Payer: Self-pay | Admitting: Oncology

## 2015-07-15 ENCOUNTER — Encounter: Payer: Self-pay | Admitting: Oncology

## 2015-07-15 ENCOUNTER — Encounter: Payer: Self-pay | Admitting: Nurse Practitioner

## 2015-07-15 ENCOUNTER — Encounter: Payer: BLUE CROSS/BLUE SHIELD | Admitting: *Deleted

## 2015-07-15 ENCOUNTER — Ambulatory Visit (HOSPITAL_BASED_OUTPATIENT_CLINIC_OR_DEPARTMENT_OTHER): Payer: BLUE CROSS/BLUE SHIELD | Admitting: Nurse Practitioner

## 2015-07-15 ENCOUNTER — Other Ambulatory Visit (HOSPITAL_BASED_OUTPATIENT_CLINIC_OR_DEPARTMENT_OTHER): Payer: BLUE CROSS/BLUE SHIELD

## 2015-07-15 ENCOUNTER — Other Ambulatory Visit: Payer: Self-pay | Admitting: Oncology

## 2015-07-15 VITALS — BP 107/62 | HR 108 | Temp 97.7°F | Resp 19 | Ht 67.0 in | Wt 127.8 lb

## 2015-07-15 DIAGNOSIS — C50919 Malignant neoplasm of unspecified site of unspecified female breast: Secondary | ICD-10-CM

## 2015-07-15 DIAGNOSIS — E039 Hypothyroidism, unspecified: Secondary | ICD-10-CM

## 2015-07-15 DIAGNOSIS — C50411 Malignant neoplasm of upper-outer quadrant of right female breast: Secondary | ICD-10-CM

## 2015-07-15 DIAGNOSIS — C50911 Malignant neoplasm of unspecified site of right female breast: Secondary | ICD-10-CM

## 2015-07-15 LAB — COMPREHENSIVE METABOLIC PANEL
ALBUMIN: 3.5 g/dL (ref 3.5–5.0)
ALK PHOS: 161 U/L — AB (ref 40–150)
ALT: <9 U/L (ref 0–55)
AST: 13 U/L (ref 5–34)
Anion Gap: 12 mEq/L — ABNORMAL HIGH (ref 3–11)
BUN: 25.8 mg/dL (ref 7.0–26.0)
CALCIUM: 9.6 mg/dL (ref 8.4–10.4)
CO2: 30 mEq/L — ABNORMAL HIGH (ref 22–29)
Chloride: 94 mEq/L — ABNORMAL LOW (ref 98–109)
Creatinine: 0.7 mg/dL (ref 0.6–1.1)
EGFR: >90 mL/min/{1.73_m2} (ref >90)
Glucose: 101 mg/dl (ref 70–140)
POTASSIUM: 3.6 meq/L (ref 3.5–5.1)
Sodium: 136 mEq/L (ref 136–145)
Total Bilirubin: 0.3 mg/dL (ref 0.20–1.20)
Total Protein: 7.2 g/dL (ref 6.4–8.3)

## 2015-07-15 LAB — CBC WITH DIFFERENTIAL/PLATELET
BASO%: 0.1 % (ref 0.0–2.0)
BASOS ABS: 0 10*3/uL (ref 0.0–0.1)
EOS%: 0 % (ref 0.0–7.0)
Eosinophils Absolute: 0 10*3/uL (ref 0.0–0.5)
HEMATOCRIT: 35.5 % (ref 34.8–46.6)
HGB: 11.8 g/dL (ref 11.6–15.9)
LYMPH%: 4.3 % — AB (ref 14.0–49.7)
MCH: 33 pg (ref 25.1–34.0)
MCHC: 33.2 g/dL (ref 31.5–36.0)
MCV: 99.2 fL (ref 79.5–101.0)
MONO#: 0.8 10*3/uL (ref 0.1–0.9)
MONO%: 5.2 % (ref 0.0–14.0)
NEUT#: 13.4 10*3/uL — ABNORMAL HIGH (ref 1.5–6.5)
NEUT%: 90.4 % — AB (ref 38.4–76.8)
Platelets: 133 10*3/uL — ABNORMAL LOW (ref 145–400)
RBC: 3.58 10*6/uL — ABNORMAL LOW (ref 3.70–5.45)
RDW: 20.1 % — ABNORMAL HIGH (ref 11.2–14.5)
WBC: 14.9 10*3/uL — ABNORMAL HIGH (ref 3.9–10.3)
lymph#: 0.6 10*3/uL — ABNORMAL LOW (ref 0.9–3.3)

## 2015-07-15 LAB — MAGNESIUM: MAGNESIUM: 1.3 mg/dL — AB (ref 1.5–2.5)

## 2015-07-15 LAB — TSH: TSH: 2.95 m[IU]/L (ref 0.308–3.960)

## 2015-07-15 NOTE — Progress Notes (Unsigned)
I discussed Margarethe's CT scanperformed yesterday with Dr. Lin Landsman, who read it. We just wanted to make sure that the pneumonitis issue was focal and not diffuse. He agrees that the left lung is completely clear, and that the process of postobstructive pneumonitis in the right lung is focal. I am documenting this for research purposes.

## 2015-07-15 NOTE — Telephone Encounter (Signed)
Gave patient avs report and appointments for February and March. Echo to [facility] for [place]. Per HB patient to be seen for f/u with her 3/8 and 3/8 and will have tx - AC on 3/8 and Keyrtuda on 3/9. Patient aware she will be contacted re echo.

## 2015-07-15 NOTE — Progress Notes (Signed)
And a Patient ID: Nicole Valentine, female   DOB: 1966/01/08, 50 y.o.   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 She had a mammogram performed that showed dense breasts bilaterally. Ultrasound of the right breast showed 2.3 x 1.9 cm area of abnormality with multiple abnormal lymph nodes. MRI of the bilateral breasts showed asymmetrical enhancement throughout the right breast consistent with multicentric disease. An ultrasound-guided biopsy performed. The known area of disease measured 1.9 x 1.9 x 2.3 cm. Multiple abnormal positive right axillary lymph nodes were noted. 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 Biopsy of the right axillary lymph node was positive for malignancy with extracapsular extension. Patient was originally seen by Dr. [name] Dr. [name] and Dr. [name]. She has elected to have a right mastectomy eventually and declined biopsies of any other areas within the breast."  She went on to receive neoadjuvant chemotherapy and attained a complete pathologic response, as documented below  METASTATIC DISEASE: From the prior summary note: Annella noted a very small right supraclavicular mass 09/25/2014, which she brought to our attention. She was having some allergy symptoms at the time so we decided to reevaluate this after a few weeks and on 10/22/2014 as the mass persisted we obtained a restaging neck CT scan, 10/28/2014. There was bilateral supraclavicular adenopathy, but also a large right pleural effusion was  incidentally noted. We proceeded to right thoracentesis 10/29/2014, and a liter of hazy yellow fluid was removed. Cytology from this procedure ([id number]) showed malignant cells consistent with adenocarcinoma, estrogen and progesterone receptor negative, HER-2 not amplified, with a signals ratio 1.12 and the number per cell being 2.75, and with an MIB-1 of 80%. I called Nicole Valentine with these results and set her up for a PET scan performed 11/06/2014, which shows widespread metastatic disease involving particularly the right lung, liver, and bones, but also the left lung, right adrenal, and multiple lymph node areas.  Her subsequent history is as detailed below.  INTERVAL HISTORY: Nicole Valentine returns today for follow-up of her metastatic triple negative breast cancer, accompanied by her husband and research nurse, Doristine Johns, RN. She has been approved to start pembrolizumab study. She met with Lexine Baton earlier this morning to sign consent. She is here today for her preliminary history and physical examination, as well as baseline lab work.   REVIEW OF SYSTEMS:  Nicole Valentine denies fevers, chills, nausea, or vomiting. She eats prunes and takes stool softeners PRN for constipation. She is urinating with no issues. She stays well hydrated. Her appetite is down, so she has been supplementing with protein shakes and she has been increasing her carbs and fat intake. She is down 40lb in the past 2 months. Her mouth is dry but there are no lesions. She denies rashes. She is in no pain besides headaches and some minor dizziness that she contributes to sinus pressure. Her ears are stopped up, and her sensitivity to high pitched sounds is decreased. Her vision is unchanged. Since starting cisplatin her functional status has improved. She is no longer on oxygen and denies shortness of breath.  She denies chest pain, cough, or palpitations. She sleeps well. She still has some upper body weakness when attempting to pick up or carry  heavy items. She is steady on her feet, but is sure to use a handrail when going up or down steps. A detailed review of systems is otherwise stable.  PAST MEDICAL HISTORY: Past Medical History  Diagnosis Date  . Complication of anesthesia     her father and uncle both had very difficult time waking up-was  something they told them may be hereditory. she will find out.  [name] Hypothyroidism   . 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 Radiation 01/21/13-03/06/13    Right Breast  . Breast cancer ([facility]) dx'd 04-10-12-rt    PAST SURGICAL HISTORY: Past Surgical History  Procedure Laterality Date  . Wisdom tooth extraction    . Portacath placement  04/22/2012    Procedure: INSERTION PORT-A-CATH;  Surgeon: [name], MD;  Location: [facility];  Service: General;  Laterality: Left;  [name] Modified mastectomy Right 11/18/2012    Procedure:  RIGHT MODIFIED MASTECTOMY;  Surgeon: [name], MD;  Location: [facility];  Service: General;  Laterality: Right;  . Knee arthroscopy Right 11/26/2013  . Port-a-cath removal Left 12/10/2013    Procedure: MINOR REMOVAL PORT-A-CATH;  Surgeon: [name], MD;  Location: [facility];  Service: General;  Laterality: Left;    FAMILY HISTORY Family History  Problem Relation Age of Onset  . Lung cancer Maternal Grandfather 36    smoker  . Prostate cancer Maternal Grandfather 90  . Throat cancer Other     Great Aunt x 2  . Liver cancer Other     Maternal Great Grandmother  . Melanoma Maternal Uncle 81  . Brain cancer Cousin 11    non-malignant  . Allergic rhinitis Neg Hx   . Asthma Neg Hx   . Eczema Neg Hx   . Urticaria Neg Hx   the patient's parents are living, in their early 4s. The patient has 2 brothers, no sisters. The patient's mother was diagnosed with breast cancer, HER-2 positive, in 2014 in [place].  GYNECOLOGIC HISTORY:   (Updated 10/03/2013) Menarche age 14, first live birth age 75. She is  GX P4. LMP January 2014. 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  The children are currently ages 9, 75, 67, and 17. Her husband, [name], is a Customer service manager.He just started a job for [employer].  They attend the [employer] [place] DIRECTIVES: The patient's husband is her HCPOA   HEALTH MAINTENANCE:  (Updated 10/03/2013) Social History  Substance Use Topics  . Smoking status: Never Smoker   . Smokeless tobacco: Never Used  . Alcohol Use: No     Colonoscopy: Never  PAP: November 2013/Dr. Smith  Bone density: January 2015, [facility], normal  Lipid panel:  Not on file  Allergies  Allergen Reactions  . Almond Meal Rash  . Almond Oil Rash  . Other Rash    LOTIONS-unknown type  . Tegaderm Ag Mesh [Silver] Rash  . Vancomycin Rash    Red Man Syndrome    Current Outpatient Prescriptions  Medication Sig Dispense Refill  . cholecalciferol (VITAMIN D) 1000 UNITS tablet Take 2,000 Units by mouth 2 (two) times daily.     [name] dexamethasone (DECADRON) 4 MG tablet Take 2  tablets (8 mg total) by mouth 2 (two) times daily with a meal. Starting on day 2 of treatment. Take for 3 days, then stop. 30 tablet 1  . dextromethorphan-guaiFENesin (MUCINEX DM) 30-600 MG 12hr tablet Take 1 tablet by mouth 2 (two) times daily as needed for cough.    . levothyroxine (SYNTHROID, LEVOTHROID) 175 MCG tablet Take 175 mcg by mouth daily before breakfast.    . loratadine-pseudoephedrine (CLARITIN-D 12-HOUR) 5-120 MG tablet Take 1 tablet by mouth 2 (two) times daily.    [name] LORazepam (ATIVAN) 0.5 MG tablet Take 1 tablet (0.5 mg total) by mouth at bedtime as needed (Nausea or vomiting). 60 tablet 2  . pantoprazole (PROTONIX) 40 MG tablet Take 1 tablet (40 mg total) by mouth daily at 12 noon. 30 tablet 2  . potassium chloride (MICRO-K) 10 MEQ CR capsule Take 2 capsules (20 mEq total) by mouth 2 (two) times  daily. 60 capsule 3  . fluconazole (DIFLUCAN) 100 MG tablet Take 1 tablet (100 mg total) by mouth daily. (Patient not taking: Reported on 07/05/2015) 7 tablet 1   No current facility-administered medications for this visit.      OBJECTIVE: Middle-aged white woman who appears stated age  74 Vitals:   07/15/15 1036  BP: 107/62  Pulse: 108  Temp: 97.7 F (36.5 C)  Resp: 19     Body mass index is 20.01 kg/(m^2).    ECOG FS:1 - Symptomatic but completely ambulatory Filed Weights   07/15/15 1036  Weight: 127 lb 12.8 oz (57.97 kg)   Skin: warm, dry  HEENT: sclerae anicteric, conjunctivae pink, oropharynx clear. No thrush or mucositis.  Lymph Nodes: No cervical or supraclavicular lymphadenopathy  Lungs: clear to auscultation bilaterally, no rales, wheezes, or rhonci  Heart: regular rate and rhythm  Abdomen: round, soft, non tender, positive bowel sounds  Musculoskeletal: No focal spinal tenderness, no peripheral edema  Neuro: non focal, well oriented, positive affect  Breasts: left breast status post mastectomy and radiation. No evidence of recurrent disease. Left axilla benign. Right breast nipple inversion. No other skin changes and no masses palpated. Right axilla benign.   LAB RESULTS:   Lab Results  Component Value Date   WBC 14.9* 07/15/2015   NEUTROABS 13.4* 07/15/2015   HGB 11.8 07/15/2015   HCT 35.5 07/15/2015   MCV 99.2 07/15/2015   PLT 133* 07/15/2015      Chemistry      Component Value Date/Time   NA 136 07/15/2015 1020   NA 137 04/21/2015 0546   K 3.6 07/15/2015 1020   K 3.5 04/21/2015 0546   CL 101 04/21/2015 0546   CL 101 10/18/2012 0859   CO2 30* 07/15/2015 1020   CO2 28 04/21/2015 0546   BUN 25.8 07/15/2015 1020   BUN 10 04/21/2015 0546   CREATININE 0.7 07/15/2015 1020   CREATININE 0.48 04/21/2015 0546      Component Value Date/Time   CALCIUM 9.6 07/15/2015 1020   CALCIUM 8.3* 04/21/2015 0546   ALKPHOS 161* 07/15/2015 1020   ALKPHOS 94  04/21/2015 0546   AST 13 07/15/2015 1020   AST 17 04/21/2015 0546   ALT <9 07/15/2015 1020   ALT 10* 04/21/2015 0546   BILITOT 0.30 07/15/2015 1020   BILITOT 0.6 04/21/2015 0546       STUDIES: Dg Thoracic Spine 2 View  06/16/2015  CLINICAL DATA:  Worsening mid upper back pain over the last few days. History of breast cancer. EXAM: THORACIC SPINE 2 VIEWS COMPARISON:  Chest x-ray 04/30/2015 FINDINGS: Left Port-A-Cath remains in place, unchanged. Stable pleural/parenchymal density in the right lower lung. Postoperative changes on the right with surgical clips in the right hilar region. No acute bony abnormality. No fracture or malalignment. Disc spaces are maintained. IMPRESSION: No acute bony abnormality. Stable postoperative changes and pleural/ parenchymal opacities in the right lower lung. Electronically Signed   By: Rolm Baptise M.D.   On: 06/16/2015 11:03   Ct Chest W Contrast  07/14/2015  CLINICAL DATA:  Restaging breast cancer diagnosed in 2013 with metastatic disease to bone, liver and right lung. Chemotherapy ongoing. Recist protocol patient. EXAM: CT CHEST, ABDOMEN, AND PELVIS WITH CONTRAST TECHNIQUE: Multidetector CT imaging of the chest, abdomen and pelvis was performed following the standard protocol during bolus administration of intravenous contrast. CONTRAST:  159m OMNIPAQUE IOHEXOL 300 MG/ML  SOLN COMPARISON:  Prior CTs 04/19/2015.  PET-CT 03/17/2015. FINDINGS: RECIST 1.1 Target Lesions: 1. Right hilar node measures 8 mm on image 25 (previously 12 mm). 2. Subcarinal node measures 10 mm on image 29 (previously 10 mm). 3. Right retrocrural node measures 10 mm on image 54 (previously 13 mm). 4. Central hepatic lesion measures 2.4 cm on image 5 of series 4 (previously 2.1 cm). 5. Right adrenal lesion measures 2.2 cm on image 58 (previously 3.1 cm). Non-target Lesions: 1. Right pleural disease, subjectively improved, although difficult to measure. 2. Right lung airspace disease,  subjectively improved. 3. Other hepatic metastases, mixed response. CT CHEST Mediastinum/Nodes: Previously demonstrated hypermetabolic right supraclavicular lymph node is incompletely visualized, although may be slightly smaller, measuring 6 mm on image 1. There are no enlarged axillary or internal mammary lymph nodes. There are multiple small mediastinal and hilar lymph nodes which have improved compared with the prior study. These are difficult to accurately measure given their confluent nature. A right hilar node which previously measured 12 mm now measures 8 mm on image 25. A subcarinal node which previously measured 10 mm measures 10 mm on image 29. No progressive adenopathy. The heart size is stable. There is a stable small pericardial effusion. Left IJ Port-A-Cath extends to the cavoatrial junction. Lungs/Pleura: The complex loculated right pleural effusion has mildly decreased in volume status post interval thoracentesis. There is still diffuse right pleural thickening and enhancement, although the focal nodularity previously noted at the posterior costophrenic angle has improved. Again, this is not easily measurable given its irregular shape. A small amount of simple dependent pleural fluid on the left appears unchanged. There has been interval improved aeration of the right middle and lower lobes with persistent volume loss, air bronchograms and septal thickening. No measurable lesion is seen within the right middle lobe. No suspicious findings in the left lung. There is a stable tiny left upper lobe nodule measuring 3 mm on image 20. There is stable dependent left lower lobe atelectasis. Musculoskeletal/Chest wall: Right mastectomy and axillary node dissection. No evidence of chest wall recurrence or suspicious osseous finding. The T9 superior endplate compression deformity is unchanged. CT ABDOMEN AND PELVIS FINDINGS Hepatobiliary: The early images through the liver were obtained prior to opacification  of the hepatic veins, limiting parenchymal assessment. The individual lesions are probably best measured on the delayed images (series 5). The largest lesion centrally in the liver measures 2.4 x 1.7 cm on image number 5 (previously 2.1 x 1.4 cm). The other smaller lesions demonstrate a mixed response. For example, the previously referenced lesions appear smaller, including a right lobe lesion (9 mm on image 12, previously  15 mm) and the left lobe lesion on image number 4 (6 mm, previously 10 mm). Other lesions appear larger, including a 10 mm lesion in segment 7 on image 5. The gallbladder appears normal. There is no biliary dilatation. Pancreas: Unremarkable. No pancreatic ductal dilatation or surrounding inflammatory changes. Spleen: Normal in size without focal abnormality. Adrenals/Urinary Tract: The right adrenal lesion appears smaller, measuring 2.2 x 1.2 cm on image 58 (previously 3.1 x 1.3 cm. The left adrenal gland appears normal. The kidneys appear normal without evidence of urinary tract calculus, suspicious lesion or hydronephrosis. No bladder abnormalities are seen. Stomach/Bowel: No evidence of bowel wall thickening, distention or surrounding inflammatory change. The pneumatosis and free air demonstrated previously have resolved. Vascular/Lymphatic: Right retrocrural node measures 10 mm on image 54 (previously 13 mm). No other enlarged abdominal or pelvic lymph nodes are seen. Stable atherosclerosis of the aorta, its branches and the iliac arteries. Reproductive: The uterus and ovaries appear unremarkable. No adnexal mass. Other: No ascites or peritoneal nodularity. Musculoskeletal: No acute or significant osseous findings. IMPRESSION: 1. Interval improvement in mediastinal lymphadenopathy and nodular pleural disease on the right. Residual disease is difficult to measure given its confluent nature. Moderate size complex right pleural effusion has mildly decreased in volume. 2. Interval improved right  lung airspace disease. Remaining density may reflect combination of lymphangitic tumor, atelectasis and postobstructive pneumonitis. 3. Mixed response of hepatic metastatic disease. 4. The right adrenal metastasis is slightly smaller. 5. Interval resolution of pneumoperitoneum and pneumatosis. Electronically Signed   By: Richardean Sale M.D.   On: 07/14/2015 15:40   Mr Jeri Cos ON Contrast  07/02/2015  CLINICAL DATA:  50 year old female with metastatic breast cancer status post brain SRS in July 2016, and subsequent whole brain radiation in November 2016. Subsequent encounter. EXAM: MRI HEAD WITHOUT AND WITH CONTRAST TECHNIQUE: Multiplanar, multiecho pulse sequences of the brain and surrounding structures were obtained without and with intravenous contrast. CONTRAST:  64m MULTIHANCE GADOBENATE DIMEGLUMINE 529 MG/ML IV SOLN COMPARISON:  03/16/2015 and earlier. FINDINGS: Previously seen numerous small enhancing cerebellar metastases have resolved on post-contrast imaging. Bilateral enhancing cerebral metastases demonstrated in October also appear resolved, including the gyriform left posterior frontal lobe lesion (would beyond series 10, image 115 today). No abnormal enhancement of the brain today. No dural thickening identified. No restricted diffusion to suggest acute infarction. No midline shift, mass effect, evidence of mass lesion, ventriculomegaly, extra-axial collection or acute intracranial hemorrhage. Cervicomedullary junction and pituitary are within normal limits. Subtle interval cerebral volume loss. Major intracranial vascular flow voids are stable and within normal limits. No abnormal gray or white matter signal today. No chronic cerebral blood products. Negative visualized cervical spinal cord. Stable to improved skull and visualized cervical spine bone marrow signal, no destructive osseous lesion identified. Visible internal auditory structures appear normal. There are mild bilateral mastoid  effusions now. Negative nasopharynx. Trace paranasal sinus mucosal thickening is stable. Orbit and scalp soft tissues are stable. IMPRESSION: 1. Numerous small brain metastases appear completely resolved following whole brain radiation. 2. Mild mastoid effusions. Electronically Signed   By: HGenevie AnnM.D.   On: 07/02/2015 10:44   Ct Abdomen Pelvis W Contrast  07/14/2015  CLINICAL DATA:  Restaging breast cancer diagnosed in 2013 with metastatic disease to bone, liver and right lung. Chemotherapy ongoing. Recist protocol patient. EXAM: CT CHEST, ABDOMEN, AND PELVIS WITH CONTRAST TECHNIQUE: Multidetector CT imaging of the chest, abdomen and pelvis was performed following the standard protocol during bolus administration of  intravenous contrast. CONTRAST:  166m OMNIPAQUE IOHEXOL 300 MG/ML  SOLN COMPARISON:  Prior CTs 04/19/2015.  PET-CT 03/17/2015. FINDINGS: RECIST 1.1 Target Lesions: 1. Right hilar node measures 8 mm on image 25 (previously 12 mm). 2. Subcarinal node measures 10 mm on image 29 (previously 10 mm). 3. Right retrocrural node measures 10 mm on image 54 (previously 13 mm). 4. Central hepatic lesion measures 2.4 cm on image 5 of series 4 (previously 2.1 cm). 5. Right adrenal lesion measures 2.2 cm on image 58 (previously 3.1 cm). Non-target Lesions: 1. Right pleural disease, subjectively improved, although difficult to measure. 2. Right lung airspace disease, subjectively improved. 3. Other hepatic metastases, mixed response. CT CHEST Mediastinum/Nodes: Previously demonstrated hypermetabolic right supraclavicular lymph node is incompletely visualized, although may be slightly smaller, measuring 6 mm on image 1. There are no enlarged axillary or internal mammary lymph nodes. There are multiple small mediastinal and hilar lymph nodes which have improved compared with the prior study. These are difficult to accurately measure given their confluent nature. A right hilar node which previously measured 12 mm now  measures 8 mm on image 25. A subcarinal node which previously measured 10 mm measures 10 mm on image 29. No progressive adenopathy. The heart size is stable. There is a stable small pericardial effusion. Left IJ Port-A-Cath extends to the cavoatrial junction. Lungs/Pleura: The complex loculated right pleural effusion has mildly decreased in volume status post interval thoracentesis. There is still diffuse right pleural thickening and enhancement, although the focal nodularity previously noted at the posterior costophrenic angle has improved. Again, this is not easily measurable given its irregular shape. A small amount of simple dependent pleural fluid on the left appears unchanged. There has been interval improved aeration of the right middle and lower lobes with persistent volume loss, air bronchograms and septal thickening. No measurable lesion is seen within the right middle lobe. No suspicious findings in the left lung. There is a stable tiny left upper lobe nodule measuring 3 mm on image 20. There is stable dependent left lower lobe atelectasis. Musculoskeletal/Chest wall: Right mastectomy and axillary node dissection. No evidence of chest wall recurrence or suspicious osseous finding. The T9 superior endplate compression deformity is unchanged. CT ABDOMEN AND PELVIS FINDINGS Hepatobiliary: The early images through the liver were obtained prior to opacification of the hepatic veins, limiting parenchymal assessment. The individual lesions are probably best measured on the delayed images (series 5). The largest lesion centrally in the liver measures 2.4 x 1.7 cm on image number 5 (previously 2.1 x 1.4 cm). The other smaller lesions demonstrate a mixed response. For example, the previously referenced lesions appear smaller, including a right lobe lesion (9 mm on image 12, previously 15 mm) and the left lobe lesion on image number 4 (6 mm, previously 10 mm). Other lesions appear larger, including a 10 mm lesion in  segment 7 on image 5. The gallbladder appears normal. There is no biliary dilatation. Pancreas: Unremarkable. No pancreatic ductal dilatation or surrounding inflammatory changes. Spleen: Normal in size without focal abnormality. Adrenals/Urinary Tract: The right adrenal lesion appears smaller, measuring 2.2 x 1.2 cm on image 58 (previously 3.1 x 1.3 cm. The left adrenal gland appears normal. The kidneys appear normal without evidence of urinary tract calculus, suspicious lesion or hydronephrosis. No bladder abnormalities are seen. Stomach/Bowel: No evidence of bowel wall thickening, distention or surrounding inflammatory change. The pneumatosis and free air demonstrated previously have resolved. Vascular/Lymphatic: Right retrocrural node measures 10 mm on image 54 (  previously 13 mm). No other enlarged abdominal or pelvic lymph nodes are seen. Stable atherosclerosis of the aorta, its branches and the iliac arteries. Reproductive: The uterus and ovaries appear unremarkable. No adnexal mass. Other: No ascites or peritoneal nodularity. Musculoskeletal: No acute or significant osseous findings. IMPRESSION: 1. Interval improvement in mediastinal lymphadenopathy and nodular pleural disease on the right. Residual disease is difficult to measure given its confluent nature. Moderate size complex right pleural effusion has mildly decreased in volume. 2. Interval improved right lung airspace disease. Remaining density may reflect combination of lymphangitic tumor, atelectasis and postobstructive pneumonitis. 3. Mixed response of hepatic metastatic disease. 4. The right adrenal metastasis is slightly smaller. 5. Interval resolution of pneumoperitoneum and pneumatosis. Electronically Signed   By: Richardean Sale M.D.   On: 07/14/2015 15:40   Dg Hip Unilat With Pelvis 1v Right  06/16/2015  CLINICAL DATA:  Worsening RIGHT hip pain increased with movement, metastatic breast cancer, increased worker round house EXAM: DG HIP (WITH  OR WITHOUT PELVIS) 1V RIGHT COMPARISON:  None ; correlation CT pelvis 04/19/2015 FINDINGS: Hip and SI joint spaces preserved. Osseous mineralization grossly normal. No acute fracture, dislocation or bone destruction. IMPRESSION: No definite acute osseous abnormalities. If patient has persistent symptoms, consider MR or radionuclide bone scan assessment to assess for occult osseous metastasis. Electronically Signed   By: Lavonia Dana M.D.   On: 06/16/2015 11:06    ASSESSMENT: 50 y.o. 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%  (2) Treated neoadjuvantly with  (a) fluorouracil, cyclophosphamide, and epirubicin (at 100 mg/M2) x4 completed 06/07/2012  (b) docetaxel (75 mg/M2) for one dose, 06/21/2012, poorly tolerated  (c) carboplatin and gemcitabine given every 21 days for 6 cycles completed 10/11/2012  (3) status post right modified radical mastectomy 11/18/2012 showing a complete pathologic response--all 16 lymph nodes were benign  (a) no plans for reconstructon  (4) adjuvant radiation therapy completed 03/06/2013  METASTATIC DISEASE June 2016 (5) 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, 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  (6) staging PET scan 11/06/2014 showed significant disease in the middle and lower lobes of the Right lung, Right effusion, multiple liver and bone lesions, as well as left lung nodules, a Right adrenal nodule, and widespread hypermetabolic adenopathy  (a) brain MRI 11/16/2014 showed multiple cerebellar lesions  (7) RADIATION TREATMENTS thoracic metastases  (a) 01/28/2015-02/11/2015: Thoracic Spine T8-10, 30 Gy in 10 fractions  (7) abraxane day 1 and day 8 of each 21 day cycle started 11/09/2014: last dose 03/12/2015 (6  cycles)  (a) PET scan 01/05/2015 shows an excellent initial response after 3 cycles  (b) PET scan 03/17/2015 shows progression  (8) zolendronate started 11/16/2014, to be repeated every 12 weeks, most recent dose 02/09/2015  (9) Foundation 1 study sent  11/06/2014: patient's sister in law Garvin Fila following [phone])  (a) 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[place], NTRK1, MYC, ARID1A and LYN, none w approved therapies  (b) pazopanib, ponatinib or crizotinib suggested as possible off-protocol options  (10) RADIATION TREATMENTS: Brain metastases:  (a) s/p SRS therapy 12/11/2014 to 4 right cerebellar lesions  (b) multiple (18+) lesions noted on repeat brain MRI 03/16/2015  (c) whole brain irradiation completed 04/12/2015  (11) uncontrolled pain: Started on OxyContin 10 mg twice a day with oxycodone 5 mg as needed 03/26/2015  (  12) Right pleural effusion  (a) s/p thoracentesis July 2016 (x2), 03/30/2015  (13) started eribulin11/29/2016, originally planned to be given day one and day 8 of each 21 day cycle, but switched to every 14 days with onpro support because of persistent leukopenia. Discontinued 05/25/14, after 2 cycles, w no evidence of response  (14) cisplatin started 05/28/2015, to be repeated every 21 days. Last dose given 07/08/15. 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   PLAN: Dr. [name] reviewed the results of her most recent chest CT, which confirms the improvement of her tumor burden since starting cisplatin. Unfortunately, we believe it would be prudent to move on to the pembrolizumab study while her bone marrow is still intact. Shenita still needs a bone scan and echocardiogram prior to the start of this study, so I have placed orders for this to be completed by next week.   The labs were reviewed in detail. The only notable result was a critical magnesium level of 1.3. She will return shortly for 2g of magnesium IV. The next magnesium level will be drawn prior to the start of  treatment on 3/6 along with a serum pregnancy test. Day 1 of the trial will be 3/8, where cyclophosphamide will be given alone. Day 2 will be her first cycle of pembrolizumab. I will see her on both days per study protocol. She understands and agrees with this plan. She knows the goal of treatment in her case is control. She has been encouraged to call with any issues that might arise before her next visit here.   Laurie Panda, NP  07/15/2015    ADDENDUM: [name] and her husband have very thoroughly reviewed the possible side effects toxicities and complications of pembrolizumab and similar drugs. They understand the common side effects as well as the more unusual but also more worrisome possibility of colitis, pneumonitis, thyroiditis, adrenal insufficiency, etc. In my experience with melanoma this drug is generally well tolerated. I am hopeful it will be easier on [place] then cisplatin has been and also perhaps more effective.  I reviewed the CT scan with the radiologist. The left lung is clear. Most of the right lung is clear as well. 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 I don't see a contraindication to her entering the study.  I personally saw this patient and performed a substantive portion of this encounter with the listed APP documented above.   Chauncey Cruel, MD Medical Oncology and Hematology [facility] [address] [place], [place] [address] Tel. [phone]    Fax. [phone]

## 2015-07-16 ENCOUNTER — Telehealth: Payer: Self-pay | Admitting: *Deleted

## 2015-07-16 ENCOUNTER — Other Ambulatory Visit: Payer: Self-pay | Admitting: *Deleted

## 2015-07-16 LAB — PHOSPHORUS: Phosphorus, Ser: 3.6 mg/dL (ref 2.5–4.5)

## 2015-07-16 LAB — T3: T3 TOTAL: 41 ng/dL — AB (ref 71–180)

## 2015-07-16 LAB — PROTHROMBIN TIME (PT)
INR: 1.2 (ref 0.8–1.2)
PROTHROMBIN TIME: 11.9 s (ref 9.1–12.0)

## 2015-07-16 LAB — T4, FREE: FREE T4: 1.46 ng/dL (ref 0.82–1.77)

## 2015-07-16 LAB — APTT: aPTT: 25 s (ref 24–33)

## 2015-07-16 NOTE — Telephone Encounter (Signed)
This RN scheduled ECHO for pt at [facility] for 07/21/2015 per Butch Penny in vascular lab.  Pt contacted and made aware.

## 2015-07-19 ENCOUNTER — Ambulatory Visit (HOSPITAL_BASED_OUTPATIENT_CLINIC_OR_DEPARTMENT_OTHER): Payer: BLUE CROSS/BLUE SHIELD

## 2015-07-19 VITALS — BP 109/67 | HR 85 | Temp 97.7°F | Resp 18

## 2015-07-19 DIAGNOSIS — C7951 Secondary malignant neoplasm of bone: Secondary | ICD-10-CM | POA: Diagnosis not present

## 2015-07-19 DIAGNOSIS — C50919 Malignant neoplasm of unspecified site of unspecified female breast: Secondary | ICD-10-CM

## 2015-07-19 DIAGNOSIS — E860 Dehydration: Secondary | ICD-10-CM

## 2015-07-19 DIAGNOSIS — C50911 Malignant neoplasm of unspecified site of right female breast: Secondary | ICD-10-CM | POA: Diagnosis not present

## 2015-07-19 MED ORDER — SODIUM CHLORIDE 0.9 % IV SOLN
Freq: Once | INTRAVENOUS | Status: AC
  Administered 2015-07-19: 10:00:00 via INTRAVENOUS
  Filled 2015-07-19: qty 1000

## 2015-07-19 MED ORDER — SODIUM CHLORIDE 0.9 % IV SOLN
Freq: Once | INTRAVENOUS | Status: AC
  Administered 2015-07-19: 10:00:00 via INTRAVENOUS

## 2015-07-19 MED ORDER — HEPARIN SOD (PORK) LOCK FLUSH 100 UNIT/ML IV SOLN
500.0000 [IU] | Freq: Once | INTRAVENOUS | Status: AC | PRN
  Administered 2015-07-19: 500 [IU]
  Filled 2015-07-19: qty 5

## 2015-07-19 MED ORDER — SODIUM CHLORIDE 0.9 % IJ SOLN
10.0000 mL | INTRAMUSCULAR | Status: DC | PRN
  Administered 2015-07-19: 10 mL
  Filled 2015-07-19: qty 10

## 2015-07-19 NOTE — Patient Instructions (Signed)
Dehydration, Adult Dehydration is a condition in which you do not have enough fluid or water in your body. It happens when you take in less fluid than you lose. Vital organs such as the kidneys, brain, and heart cannot function without a proper amount of fluids. Any loss of fluids from the body can cause dehydration.  Dehydration can range from mild to severe. This condition should be treated right away to help prevent it from becoming severe. CAUSES  This condition may be caused by:  Vomiting.  Diarrhea.  Excessive sweating, such as when exercising in hot or humid weather.  Not drinking enough fluid during strenuous exercise or during an illness.  Excessive urine output.  Fever.  Certain medicines. RISK FACTORS This condition is more likely to develop in:  People who are taking certain medicines that cause the body to lose excess fluid (diuretics).   People who have a chronic illness, such as diabetes, that may increase urination.  Older adults.   People who live at high altitudes.   People who participate in endurance sports.  SYMPTOMS  Mild Dehydration  Thirst.  Dry lips.  Slightly dry mouth.  Dry, warm skin. Moderate Dehydration  Very dry mouth.   Muscle cramps.   Dark urine and decreased urine production.   Decreased tear production.   Headache.   Light-headedness, especially when you stand up from a sitting position.  Severe Dehydration  Changes in skin.   Cold and clammy skin.   Skin does not spring back quickly when lightly pinched and released.   Changes in body fluids.   Extreme thirst.   No tears.   Not able to sweat when body temperature is high, such as in hot weather.   Minimal urine production.   Changes in vital signs.   Rapid, weak pulse (more than 100 beats per minute when you are sitting still).   Rapid breathing.   Low blood pressure.   Other changes.   Sunken eyes.   Cold hands and feet.    Confusion.  Lethargy and difficulty being awakened.  Fainting (syncope).   Short-term weight loss.   Unconsciousness. DIAGNOSIS  This condition may be diagnosed based on your symptoms. You may also have tests to determine how severe your dehydration is. These tests may include:   Urine tests.   Blood tests.  TREATMENT  Treatment for this condition depends on the severity. Mild or moderate dehydration can often be treated at home. Treatment should be started right away. Do not wait until dehydration becomes severe. Severe dehydration needs to be treated at the hospital. Treatment for Mild Dehydration  Drinking plenty of water to replace the fluid you have lost.   Replacing minerals in your blood (electrolytes) that you may have lost.  Treatment for Moderate Dehydration  Consuming oral rehydration solution (ORS). Treatment for Severe Dehydration  Receiving fluid through an IV tube.   Receiving electrolyte solution through a feeding tube that is passed through your nose and into your stomach (nasogastric tube or NG tube).  Correcting any abnormalities in electrolytes. HOME CARE INSTRUCTIONS   Drink enough fluid to keep your urine clear or pale yellow.   Drink water or fluid slowly by taking small sips. You can also try sucking on ice cubes.  Have food or beverages that contain electrolytes. Examples include bananas and sports drinks.  Take over-the-counter and prescription medicines only as told by your health care provider.   Prepare ORS according to the manufacturer's instructions. Take sips   of ORS every 5 minutes until your urine returns to normal.  If you have vomiting or diarrhea, continue to try to drink water, ORS, or both.   If you have diarrhea, avoid:   Beverages that contain caffeine.   Fruit juice.   Milk.   Carbonated soft drinks.  Do not take salt tablets. This can lead to the condition of having too much sodium in your body  (hypernatremia).  SEEK MEDICAL CARE IF:  You cannot eat or drink without vomiting.  You have had moderate diarrhea during a period of more than 24 hours.  You have a fever. SEEK IMMEDIATE MEDICAL CARE IF:   You have extreme thirst.  You have severe diarrhea.  You have not urinated in 6-8 hours, or you have urinated only a small amount of very dark urine.  You have shriveled skin.  You are dizzy, confused, or both.   This information is not intended to replace advice given to you by your health care provider. Make sure you discuss any questions you have with your health care provider.   Document Released: 05/08/2005 Document Revised: 01/27/2015 Document Reviewed: 09/23/2014 Elsevier Interactive Patient Education 2016 [employer].    Hypomagnesemia Hypomagnesemia is a condition in which the level of magnesium in the blood is low. Magnesium is a mineral that is found in many foods. It is used in many different processes in the body. Hypomagnesemia can affect every organ in the body. It can cause life-threatening problems. CAUSES Causes of hypomagnesemia include:  Not getting enough magnesium in your diet.  Malnutrition.  Problems with absorbing magnesium from the intestines.  Dehydration.  Alcohol abuse.  Vomiting.  Severe diarrhea.  Some medicines, including medicines that make you urinate more.  Certain diseases, such as kidney disease, diabetes, and overactive thyroid. SIGNS AND SYMPTOMS  Involuntary shaking or trembling of a body part (tremor).  Confusion.  Muscle weakness.  Sensitivity to light, sound, and touch.  Psychiatric issues, such as depression, irritability, or psychosis.  Sudden tightening of muscles (muscle spasms).  Tingling in the arms and legs.  A feeling of fluttering of the heart. These symptoms are more severe if magnesium levels drop suddenly. DIAGNOSIS To make a diagnosis, your health care provider will do a physical exam and  order blood and urine tests. TREATMENT Treatment will depend on the cause and the severity of your condition. It may involve:  A magnesium supplement. This can be taken in pill form. It can also be given through an IV tube. This is usually done if the condition is severe.  Changes to your diet. You may be directed to eat foods that have a lot of magnesium, such as green leafy vegetables, peas, beans, and nuts.  Eliminating alcohol from your diet. HOME CARE INSTRUCTIONS  Include foods with magnesium in your diet. Foods that are rich in magnesium include green vegetables, beans, nuts and seeds, and whole grains.  Take medicines only as directed by your health care provider.  Take magnesium supplements if your health care provider instructs you to do that. Take them as directed.  Have your magnesium levels monitored as directed by your health care provider.  When you are active, drink fluids that contain electrolytes.  Keep all follow-up visits as directed by your health care provider. This is important. SEEK MEDICAL CARE IF:  You get worse instead of better.  Your symptoms return. SEEK IMMEDIATE MEDICAL CARE IF:  Your symptoms are severe.   This information is not intended  to replace advice given to you by your health care provider. Make sure you discuss any questions you have with your health care provider.   Document Released: 02/01/2005 Document Revised: 05/29/2014 Document Reviewed: 12/22/2013 Elsevier Interactive Patient Education [employer].

## 2015-07-19 NOTE — Telephone Encounter (Signed)
Echo scheduled by desk nurse - see 2/24 pof.

## 2015-07-21 ENCOUNTER — Ambulatory Visit (HOSPITAL_COMMUNITY)
Admission: RE | Admit: 2015-07-21 | Discharge: 2015-07-21 | Disposition: A | Discharge status: Home / Self Care | Payer: BLUE CROSS/BLUE SHIELD | Source: Ambulatory Visit | Attending: Nurse Practitioner | Admitting: Nurse Practitioner

## 2015-07-21 DIAGNOSIS — C50911 Malignant neoplasm of unspecified site of right female breast: Secondary | ICD-10-CM | POA: Diagnosis not present

## 2015-07-21 DIAGNOSIS — I082 Rheumatic disorders of both aortic and tricuspid valves: Secondary | ICD-10-CM | POA: Insufficient documentation

## 2015-07-21 NOTE — Progress Notes (Signed)
  Echocardiogram 2D Echocardiogram has been performed.  Nicole Valentine 07/21/2015, 10:08 AM

## 2015-07-22 ENCOUNTER — Other Ambulatory Visit: Payer: Self-pay | Admitting: Oncology

## 2015-07-22 ENCOUNTER — Encounter (HOSPITAL_COMMUNITY)
Admission: RE | Admit: 2015-07-22 | Discharge: 2015-07-22 | Disposition: A | Discharge status: Home / Self Care | Payer: BLUE CROSS/BLUE SHIELD | Source: Ambulatory Visit | Attending: Nurse Practitioner | Admitting: Nurse Practitioner

## 2015-07-22 DIAGNOSIS — C50911 Malignant neoplasm of unspecified site of right female breast: Secondary | ICD-10-CM | POA: Insufficient documentation

## 2015-07-22 MED ORDER — TECHNETIUM TC 99M MEDRONATE IV KIT
25.0000 | PACK | Freq: Once | INTRAVENOUS | Status: AC | PRN
  Administered 2015-07-22: 26.5 via INTRAVENOUS

## 2015-07-23 ENCOUNTER — Telehealth: Payer: Self-pay

## 2015-07-23 ENCOUNTER — Encounter (HOSPITAL_COMMUNITY): Admission: RE | Admit: 2015-07-23 | Payer: BLUE CROSS/BLUE SHIELD | Source: Ambulatory Visit

## 2015-07-23 ENCOUNTER — Telehealth: Payer: Self-pay | Admitting: Oncology

## 2015-07-23 ENCOUNTER — Encounter (HOSPITAL_COMMUNITY): Payer: BLUE CROSS/BLUE SHIELD

## 2015-07-23 ENCOUNTER — Other Ambulatory Visit: Payer: Self-pay | Admitting: Oncology

## 2015-07-23 DIAGNOSIS — C50912 Malignant neoplasm of unspecified site of left female breast: Secondary | ICD-10-CM

## 2015-07-23 DIAGNOSIS — C50411 Malignant neoplasm of upper-outer quadrant of right female breast: Secondary | ICD-10-CM

## 2015-07-23 NOTE — Telephone Encounter (Signed)
Spoke with patient confirmed appt 3/6 for [facility]GA [facility] at [facility]  per GM 3/3 pof

## 2015-07-23 NOTE — Telephone Encounter (Signed)
Nikki Electrical engineer spoke with this RN and stated contact with [facility] has been made. Arrangements are in process for evaluation of EF% and if needed pt will proceed to a Muga-  At present pt's concerns are being managed by [facility].

## 2015-07-23 NOTE — Telephone Encounter (Signed)
Patient called today regarding an answer to the "medication to gain weight" that she spoke with Val, RN about yesterday.  She would like to know the results of her bone scan.  Lastly she received a call today from scheduling to let her know that she has been scheduled for a MUGA on 07/26/15.  She states that she just had an echo on 07/21/15 and has not heard the results from that test.  Patient is requesting a call back from [place], [place].

## 2015-07-26 ENCOUNTER — Other Ambulatory Visit (HOSPITAL_COMMUNITY)
Admission: RE | Admit: 2015-07-26 | Discharge: 2015-07-26 | Disposition: A | Discharge status: Home / Self Care | Payer: BLUE CROSS/BLUE SHIELD | Source: Ambulatory Visit | Attending: Oncology | Admitting: Oncology

## 2015-07-26 ENCOUNTER — Other Ambulatory Visit: Payer: Self-pay | Admitting: Nurse Practitioner

## 2015-07-26 ENCOUNTER — Ambulatory Visit (HOSPITAL_COMMUNITY): Payer: BLUE CROSS/BLUE SHIELD

## 2015-07-26 ENCOUNTER — Other Ambulatory Visit (HOSPITAL_BASED_OUTPATIENT_CLINIC_OR_DEPARTMENT_OTHER): Payer: BLUE CROSS/BLUE SHIELD

## 2015-07-26 ENCOUNTER — Telehealth: Payer: Self-pay

## 2015-07-26 ENCOUNTER — Other Ambulatory Visit: Payer: Self-pay | Admitting: *Deleted

## 2015-07-26 ENCOUNTER — Telehealth: Payer: Self-pay | Admitting: Oncology

## 2015-07-26 DIAGNOSIS — C50912 Malignant neoplasm of unspecified site of left female breast: Secondary | ICD-10-CM

## 2015-07-26 DIAGNOSIS — C7951 Secondary malignant neoplasm of bone: Secondary | ICD-10-CM

## 2015-07-26 DIAGNOSIS — C50911 Malignant neoplasm of unspecified site of right female breast: Secondary | ICD-10-CM

## 2015-07-26 DIAGNOSIS — R634 Abnormal weight loss: Secondary | ICD-10-CM

## 2015-07-26 DIAGNOSIS — C50919 Malignant neoplasm of unspecified site of unspecified female breast: Secondary | ICD-10-CM | POA: Insufficient documentation

## 2015-07-26 DIAGNOSIS — M6250 Muscle wasting and atrophy, not elsewhere classified, unspecified site: Secondary | ICD-10-CM

## 2015-07-26 DIAGNOSIS — E440 Moderate protein-calorie malnutrition: Secondary | ICD-10-CM

## 2015-07-26 DIAGNOSIS — C50411 Malignant neoplasm of upper-outer quadrant of right female breast: Secondary | ICD-10-CM

## 2015-07-26 LAB — COMPREHENSIVE METABOLIC PANEL
ALT: 10 U/L (ref 0–55)
AST: 18 U/L (ref 5–34)
Albumin: 3 g/dL — ABNORMAL LOW (ref 3.5–5.0)
Alkaline Phosphatase: 160 U/L — ABNORMAL HIGH (ref 40–150)
Anion Gap: 10 mEq/L (ref 3–11)
BUN: 16.5 mg/dL (ref 7.0–26.0)
CHLORIDE: 99 meq/L (ref 98–109)
CO2: 32 meq/L — AB (ref 22–29)
CREATININE: 0.6 mg/dL (ref 0.6–1.1)
Calcium: 9.3 mg/dL (ref 8.4–10.4)
EGFR: >90 mL/min/{1.73_m2} (ref >90)
Glucose: 95 mg/dl (ref 70–140)
Potassium: 3.9 mEq/L (ref 3.5–5.1)
SODIUM: 142 meq/L (ref 136–145)
Total Bilirubin: 0.45 mg/dL (ref 0.20–1.20)
Total Protein: 6.8 g/dL (ref 6.4–8.3)

## 2015-07-26 LAB — CBC WITH DIFFERENTIAL/PLATELET
BASO%: 0.2 % (ref 0.0–2.0)
Basophils Absolute: 0 10*3/uL (ref 0.0–0.1)
EOS%: 0.2 % (ref 0.0–7.0)
Eosinophils Absolute: 0 10*3/uL (ref 0.0–0.5)
HCT: 30.6 % — ABNORMAL LOW (ref 34.8–46.6)
HGB: 10 g/dL — ABNORMAL LOW (ref 11.6–15.9)
LYMPH#: 0.4 10*3/uL — AB (ref 0.9–3.3)
LYMPH%: 7.3 % — AB (ref 14.0–49.7)
MCH: 33.9 pg (ref 25.1–34.0)
MCHC: 32.8 g/dL (ref 31.5–36.0)
MCV: 103.3 fL — ABNORMAL HIGH (ref 79.5–101.0)
MONO#: 0.4 10*3/uL (ref 0.1–0.9)
MONO%: 7.5 % (ref 0.0–14.0)
NEUT#: 4.3 10*3/uL (ref 1.5–6.5)
NEUT%: 84.8 % — AB (ref 38.4–76.8)
Platelets: 234 10*3/uL (ref 145–400)
RBC: 2.96 10*6/uL — AB (ref 3.70–5.45)
RDW: 22.6 % — ABNORMAL HIGH (ref 11.2–14.5)
WBC: 5.1 10*3/uL (ref 3.9–10.3)

## 2015-07-26 LAB — PHOSPHORUS: Phosphorus: 4.1 mg/dL (ref 2.5–4.6)

## 2015-07-26 LAB — HCG, SERUM, QUALITATIVE: PREG SERUM: NEGATIVE

## 2015-07-26 LAB — RESEARCH LABS

## 2015-07-26 LAB — MAGNESIUM: MAGNESIUM: 1.3 mg/dL — AB (ref 1.5–2.5)

## 2015-07-26 NOTE — Telephone Encounter (Signed)
Spoke with patient re inf 3/8 @ 8:30 am. Appointment added by inf scheduler. No other orders per 3/6 pof.

## 2015-07-26 NOTE — Telephone Encounter (Signed)
Patient is requesting an order for PT to strengthen her muscles, she will stop by for a signed handicap parking sticker and she would also like to pick up a copy of her most recent MRI of the brain because she is seeing an ENT specialist tomorrow.  Patient will stop over after her 11:30 lab appt.

## 2015-07-26 NOTE — Telephone Encounter (Signed)
This is Dr [name]'s patient

## 2015-07-26 NOTE — Telephone Encounter (Signed)
This RN has form and MRI for pick up at [facility] [employer]. This RN needs to speak with pt per medication inquiry.

## 2015-07-27 ENCOUNTER — Ambulatory Visit (HOSPITAL_BASED_OUTPATIENT_CLINIC_OR_DEPARTMENT_OTHER): Payer: BLUE CROSS/BLUE SHIELD

## 2015-07-27 ENCOUNTER — Encounter: Payer: Self-pay | Admitting: *Deleted

## 2015-07-27 VITALS — BP 126/78 | HR 91 | Temp 98.2°F | Resp 18

## 2015-07-27 DIAGNOSIS — E860 Dehydration: Secondary | ICD-10-CM

## 2015-07-27 DIAGNOSIS — C7931 Secondary malignant neoplasm of brain: Secondary | ICD-10-CM

## 2015-07-27 DIAGNOSIS — C7971 Secondary malignant neoplasm of right adrenal gland: Secondary | ICD-10-CM

## 2015-07-27 DIAGNOSIS — C787 Secondary malignant neoplasm of liver and intrahepatic bile duct: Secondary | ICD-10-CM

## 2015-07-27 DIAGNOSIS — C50919 Malignant neoplasm of unspecified site of unspecified female breast: Secondary | ICD-10-CM

## 2015-07-27 DIAGNOSIS — C50411 Malignant neoplasm of upper-outer quadrant of right female breast: Secondary | ICD-10-CM | POA: Diagnosis not present

## 2015-07-27 DIAGNOSIS — C50911 Malignant neoplasm of unspecified site of right female breast: Secondary | ICD-10-CM

## 2015-07-27 DIAGNOSIS — C778 Secondary and unspecified malignant neoplasm of lymph nodes of multiple regions: Secondary | ICD-10-CM

## 2015-07-27 DIAGNOSIS — C7802 Secondary malignant neoplasm of left lung: Secondary | ICD-10-CM

## 2015-07-27 DIAGNOSIS — C7801 Secondary malignant neoplasm of right lung: Secondary | ICD-10-CM

## 2015-07-27 DIAGNOSIS — C7951 Secondary malignant neoplasm of bone: Secondary | ICD-10-CM

## 2015-07-27 MED ORDER — SODIUM CHLORIDE 0.9% FLUSH
10.0000 mL | INTRAVENOUS | Status: DC | PRN
  Administered 2015-07-27: 10 mL via INTRAVENOUS
  Filled 2015-07-27: qty 10

## 2015-07-27 MED ORDER — HEPARIN SOD (PORK) LOCK FLUSH 100 UNIT/ML IV SOLN
250.0000 [IU] | Freq: Once | INTRAVENOUS | Status: DC | PRN
  Filled 2015-07-27: qty 5

## 2015-07-27 MED ORDER — SODIUM CHLORIDE 0.9 % IJ SOLN
3.0000 mL | Freq: Once | INTRAMUSCULAR | Status: DC | PRN
  Filled 2015-07-27: qty 10

## 2015-07-27 MED ORDER — HEPARIN SOD (PORK) LOCK FLUSH 100 UNIT/ML IV SOLN
500.0000 [IU] | Freq: Once | INTRAVENOUS | Status: AC
  Administered 2015-07-27: 500 [IU] via INTRAVENOUS
  Filled 2015-07-27: qty 5

## 2015-07-27 MED ORDER — SODIUM CHLORIDE 0.9 % IV SOLN
Freq: Once | INTRAVENOUS | Status: AC
  Administered 2015-07-27: 09:00:00 via INTRAVENOUS
  Filled 2015-07-27: qty 500

## 2015-07-27 NOTE — Patient Instructions (Signed)
Hypomagnesemia °Hypomagnesemia is a condition in which the level of magnesium in the blood is low. Magnesium is a mineral that is found in many foods. It is used in many different processes in the body. Hypomagnesemia can affect every organ in the body. It can cause life-threatening problems. °CAUSES °Causes of hypomagnesemia include: °· Not getting enough magnesium in your diet. °· Malnutrition. °· Problems with absorbing magnesium from the intestines. °· Dehydration. °· Alcohol abuse. °· Vomiting. °· Severe diarrhea. °· Some medicines, including medicines that make you urinate more. °· Certain diseases, such as kidney disease, diabetes, and overactive thyroid. °SIGNS AND SYMPTOMS °· Involuntary shaking or trembling of a body part (tremor). °· Confusion. °· Muscle weakness. °· Sensitivity to light, sound, and touch. °· Psychiatric issues, such as depression, irritability, or psychosis. °· Sudden tightening of muscles (muscle spasms). °· Tingling in the arms and legs. °· A feeling of fluttering of the heart. °These symptoms are more severe if magnesium levels drop suddenly. °DIAGNOSIS °To make a diagnosis, your health care provider will do a physical exam and order blood and urine tests. °TREATMENT °Treatment will depend on the cause and the severity of your condition. It may involve: °· A magnesium supplement. This can be taken in pill form. It can also be given through an IV tube. This is usually done if the condition is severe. °· Changes to your diet. You may be directed to eat foods that have a lot of magnesium, such as green leafy vegetables, peas, beans, and nuts. °· Eliminating alcohol from your diet. °HOME CARE INSTRUCTIONS °· Include foods with magnesium in your diet. Foods that are rich in magnesium include green vegetables, beans, nuts and seeds, and whole grains. °· Take medicines only as directed by your health care provider. °· Take magnesium supplements if your health care provider instructs you to  do that. Take them as directed. °· Have your magnesium levels monitored as directed by your health care provider. °· When you are active, drink fluids that contain electrolytes. °· Keep all follow-up visits as directed by your health care provider. This is important. °SEEK MEDICAL CARE IF: °· You get worse instead of better. °· Your symptoms return. °SEEK IMMEDIATE MEDICAL CARE IF: °· Your symptoms are severe. °  °This information is not intended to replace advice given to you by your health care provider. Make sure you discuss any questions you have with your health care provider. °  °Document Released: 02/01/2005 Document Revised: 05/29/2014 Document Reviewed: 12/22/2013 °Elsevier Interactive Patient Education ©2016 Elsevier Inc. ° °

## 2015-07-28 ENCOUNTER — Other Ambulatory Visit: Payer: Self-pay | Admitting: *Deleted

## 2015-07-28 ENCOUNTER — Ambulatory Visit (HOSPITAL_BASED_OUTPATIENT_CLINIC_OR_DEPARTMENT_OTHER): Payer: BLUE CROSS/BLUE SHIELD | Admitting: Nurse Practitioner

## 2015-07-28 ENCOUNTER — Ambulatory Visit (HOSPITAL_BASED_OUTPATIENT_CLINIC_OR_DEPARTMENT_OTHER): Payer: BLUE CROSS/BLUE SHIELD

## 2015-07-28 ENCOUNTER — Telehealth: Payer: Self-pay | Admitting: Oncology

## 2015-07-28 ENCOUNTER — Other Ambulatory Visit: Payer: Self-pay | Admitting: Oncology

## 2015-07-28 ENCOUNTER — Other Ambulatory Visit: Payer: BLUE CROSS/BLUE SHIELD

## 2015-07-28 ENCOUNTER — Encounter: Payer: Self-pay | Admitting: *Deleted

## 2015-07-28 ENCOUNTER — Encounter: Payer: Self-pay | Admitting: Nurse Practitioner

## 2015-07-28 VITALS — BP 132/78 | HR 105 | Temp 98.1°F | Resp 18 | Ht 67.0 in | Wt 132.3 lb

## 2015-07-28 DIAGNOSIS — C7951 Secondary malignant neoplasm of bone: Secondary | ICD-10-CM

## 2015-07-28 DIAGNOSIS — C7931 Secondary malignant neoplasm of brain: Secondary | ICD-10-CM

## 2015-07-28 DIAGNOSIS — C50411 Malignant neoplasm of upper-outer quadrant of right female breast: Secondary | ICD-10-CM

## 2015-07-28 DIAGNOSIS — C7801 Secondary malignant neoplasm of right lung: Secondary | ICD-10-CM

## 2015-07-28 DIAGNOSIS — Z5111 Encounter for antineoplastic chemotherapy: Secondary | ICD-10-CM

## 2015-07-28 DIAGNOSIS — Z006 Encounter for examination for normal comparison and control in clinical research program: Secondary | ICD-10-CM

## 2015-07-28 DIAGNOSIS — C787 Secondary malignant neoplasm of liver and intrahepatic bile duct: Secondary | ICD-10-CM | POA: Diagnosis not present

## 2015-07-28 DIAGNOSIS — C50911 Malignant neoplasm of unspecified site of right female breast: Secondary | ICD-10-CM | POA: Diagnosis not present

## 2015-07-28 DIAGNOSIS — K668 Other specified disorders of peritoneum: Secondary | ICD-10-CM

## 2015-07-28 DIAGNOSIS — C50919 Malignant neoplasm of unspecified site of unspecified female breast: Secondary | ICD-10-CM

## 2015-07-28 MED ORDER — LORAZEPAM 0.5 MG PO TABS: 0.5000 mg | ORAL_TABLET | Freq: Every evening | ORAL | Status: AC | PRN

## 2015-07-28 MED ORDER — SODIUM CHLORIDE 0.9 % IV SOLN
Freq: Once | INTRAVENOUS | Status: AC
  Administered 2015-07-28: 10:00:00 via INTRAVENOUS

## 2015-07-28 MED ORDER — SODIUM CHLORIDE 0.9 % IV SOLN
Freq: Once | INTRAVENOUS | Status: AC
  Administered 2015-07-28: 11:00:00 via INTRAVENOUS
  Filled 2015-07-28: qty 4

## 2015-07-28 MED ORDER — SODIUM CHLORIDE 0.9 % IV SOLN
300.0000 mg/m2 | Freq: Once | INTRAVENOUS | Status: AC
  Administered 2015-07-28: 500 mg via INTRAVENOUS
  Filled 2015-07-28: qty 25

## 2015-07-28 MED ORDER — PANTOPRAZOLE SODIUM 40 MG PO TBEC: 40.0000 mg | DELAYED_RELEASE_TABLET | Freq: Every day | ORAL | Status: AC

## 2015-07-28 MED ORDER — SODIUM CHLORIDE 0.9% FLUSH
10.0000 mL | INTRAVENOUS | Status: DC | PRN
  Administered 2015-07-28: 10 mL
  Filled 2015-07-28: qty 10

## 2015-07-28 MED ORDER — HEPARIN SOD (PORK) LOCK FLUSH 100 UNIT/ML IV SOLN
500.0000 [IU] | Freq: Once | INTRAVENOUS | Status: AC | PRN
  Administered 2015-07-28: 500 [IU]
  Filled 2015-07-28: qty 5

## 2015-07-28 NOTE — Telephone Encounter (Signed)
Gave patient avs report and appointments for March thru May. Appointment for lymphedema clinic consult already on schedule.

## 2015-07-28 NOTE — Patient Instructions (Signed)
Cyclophosphamide injection What is this medicine? CYCLOPHOSPHAMIDE (sye kloe FOSS fa mide) is a chemotherapy drug. It slows the growth of cancer cells. This medicine is used to treat many types of cancer like lymphoma, myeloma, leukemia, breast cancer, and ovarian cancer, to name a few. This medicine may be used for other purposes; ask your health care provider or pharmacist if you have questions. What should I tell my health care provider before I take this medicine? They need to know if you have any of these conditions: -blood disorders -history of other chemotherapy -infection -kidney disease -liver disease -recent or ongoing radiation therapy -tumors in the bone marrow -an unusual or allergic reaction to cyclophosphamide, other chemotherapy, other medicines, foods, dyes, or preservatives -pregnant or trying to get pregnant -breast-feeding How should I use this medicine? This drug is usually given as an injection into a vein or muscle or by infusion into a vein. It is administered in a hospital or clinic by a specially trained health care professional. Talk to your pediatrician regarding the use of this medicine in children. Special care may be needed. Overdosage: If you think you have taken too much of this medicine contact a poison control center or emergency room at once. NOTE: This medicine is only for you. Do not share this medicine with others. What if I miss a dose? It is important not to miss your dose. Call your doctor or health care professional if you are unable to keep an appointment. What may interact with this medicine? This medicine may interact with the following medications: -amiodarone -amphotericin B -azathioprine -certain antiviral medicines for HIV or AIDS such as protease inhibitors (e.g., indinavir, ritonavir) and zidovudine -certain blood pressure medications such as benazepril, captopril, enalapril, fosinopril, lisinopril, moexipril, monopril, perindopril,  quinapril, ramipril, trandolapril -certain cancer medications such as anthracyclines (e.g., daunorubicin, doxorubicin), busulfan, cytarabine, paclitaxel, pentostatin, tamoxifen, trastuzumab -certain diuretics such as chlorothiazide, chlorthalidone, hydrochlorothiazide, indapamide, metolazone -certain medicines that treat or prevent blood clots like warfarin -certain muscle relaxants such as succinylcholine -cyclosporine -etanercept -indomethacin -medicines to increase blood counts like filgrastim, pegfilgrastim, sargramostim -medicines used as general anesthesia -metronidazole -natalizumab This list may not describe all possible interactions. Give your health care provider a list of all the medicines, herbs, non-prescription drugs, or dietary supplements you use. Also tell them if you smoke, drink alcohol, or use illegal drugs. Some items may interact with your medicine. What should I watch for while using this medicine? Visit your doctor for checks on your progress. This drug may make you feel generally unwell. This is not uncommon, as chemotherapy can affect healthy cells as well as cancer cells. Report any side effects. Continue your course of treatment even though you feel ill unless your doctor tells you to stop. Drink water or other fluids as directed. Urinate often, even at night. In some cases, you may be given additional medicines to help with side effects. Follow all directions for their use. Call your doctor or health care professional for advice if you get a fever, chills or sore throat, or other symptoms of a cold or flu. Do not treat yourself. This drug decreases your body's ability to fight infections. Try to avoid being around people who are sick. This medicine may increase your risk to bruise or bleed. Call your doctor or health care professional if you notice any unusual bleeding. Be careful brushing and flossing your teeth or using a toothpick because you may get an infection or  bleed more easily. If you have  any dental work done, tell your dentist you are receiving this medicine. You may get drowsy or dizzy. Do not drive, use machinery, or do anything that needs mental alertness until you know how this medicine affects you. Do not become pregnant while taking this medicine or for 1 year after stopping it. Women should inform their doctor if they wish to become pregnant or think they might be pregnant. Men should not father a child while taking this medicine and for 4 months after stopping it. There is a potential for serious side effects to an unborn child. Talk to your health care professional or pharmacist for more information. Do not breast-feed an infant while taking this medicine. This medicine may interfere with the ability to have a child. This medicine has caused ovarian failure in some women. This medicine has caused reduced sperm counts in some men. You should talk with your doctor or health care professional if you are concerned about your fertility. If you are going to have surgery, tell your doctor or health care professional that you have taken this medicine. What side effects may I notice from receiving this medicine? Side effects that you should report to your doctor or health care professional as soon as possible: -allergic reactions like skin rash, itching or hives, swelling of the face, lips, or tongue -low blood counts - this medicine may decrease the number of white blood cells, red blood cells and platelets. You may be at increased risk for infections and bleeding. -signs of infection - fever or chills, cough, sore throat, pain or difficulty passing urine -signs of decreased platelets or bleeding - bruising, pinpoint red spots on the skin, black, tarry stools, blood in the urine -signs of decreased red blood cells - unusually weak or tired, fainting spells, lightheadedness -breathing problems -dark urine -dizziness -palpitations -swelling of the  ankles, feet, hands -trouble passing urine or change in the amount of urine -weight gain -yellowing of the eyes or skin Side effects that usually do not require medical attention (report to your doctor or health care professional if they continue or are bothersome): -changes in nail or skin color -hair loss -missed menstrual periods -mouth sores -nausea, vomiting This list may not describe all possible side effects. Call your doctor for medical advice about side effects. You may report side effects to FDA at 1-800-FDA-1088. Where should I keep my medicine? This drug is given in a hospital or clinic and will not be stored at home. NOTE: This sheet is a summary. It may not cover all possible information. If you have questions about this medicine, talk to your doctor, pharmacist, or health care provider.    2016, [employer]. (2012-03-22 16:22:58)  [facility] Discharge Instructions for Patients Receiving Chemotherapy  Today you received the following chemotherapy agents: Cytoxan.   To help prevent nausea and vomiting after your treatment, we encourage you to take your nausea medication as prescribed.   If you develop nausea and vomiting that is not controlled by your nausea medication, call the clinic.   BELOW ARE SYMPTOMS THAT SHOULD BE REPORTED IMMEDIATELY:  *FEVER GREATER THAN 100.5 F  *CHILLS WITH OR WITHOUT FEVER  NAUSEA AND VOMITING THAT IS NOT CONTROLLED WITH YOUR NAUSEA MEDICATION  *UNUSUAL SHORTNESS OF BREATH  *UNUSUAL BRUISING OR BLEEDING  TENDERNESS IN MOUTH AND THROAT WITH OR WITHOUT PRESENCE OF ULCERS  *URINARY PROBLEMS  *BOWEL PROBLEMS  UNUSUAL RASH Items with * indicate a potential emergency and should be followed up as  soon as possible.  Feel free to call the clinic you have any questions or concerns. The clinic phone number is [phone].  Please show the [facility] at check-in to the Emergency Department and triage  nurse.

## 2015-07-28 NOTE — Progress Notes (Signed)
And a Patient ID: Nicole Valentine, female   DOB: Oct 03, 1965, 50 y.o.   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 She had a mammogram performed that showed dense breasts bilaterally. Ultrasound of the right breast showed 2.3 x 1.9 cm area of abnormality with multiple abnormal lymph nodes. MRI of the bilateral breasts showed asymmetrical enhancement throughout the right breast consistent with multicentric disease. An ultrasound-guided biopsy performed. The known area of disease measured 1.9 x 1.9 x 2.3 cm. Multiple abnormal positive right axillary lymph nodes were noted. 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 Biopsy of the right axillary lymph node was positive for malignancy with extracapsular extension. Patient was originally seen by Dr. [name] Dr. [name] and Dr. [name]. She has elected to have a right mastectomy eventually and declined biopsies of any other areas within the breast."  She went on to receive neoadjuvant chemotherapy and attained a complete pathologic response, as documented below  METASTATIC DISEASE: From the prior summary note: Nicole Valentine noted a very small right supraclavicular mass 09/25/2014, which she brought to our attention. She was having some allergy symptoms at the time so we decided to reevaluate this after a few weeks and on 10/22/2014 as the mass persisted we obtained a restaging neck CT scan, 10/28/2014. There was bilateral supraclavicular adenopathy, but also a large right pleural effusion was  incidentally noted. We proceeded to right thoracentesis 10/29/2014, and a liter of hazy yellow fluid was removed. Cytology from this procedure ([id number]) showed malignant cells consistent with adenocarcinoma, estrogen and progesterone receptor negative, HER-2 not amplified, with a signals ratio 1.12 and the number per cell being 2.75, and with an MIB-1 of 80%. I called Nicole Valentine with these results and set her up for a PET scan performed 11/06/2014, which shows widespread metastatic disease involving particularly the right lung, liver, and bones, but also the left lung, right adrenal, and multiple lymph node areas.  Her subsequent history is as detailed below.  INTERVAL HISTORY: Nicole Valentine returns today for follow-up of her metastatic triple negative breast cancer, accompanied by research nurse, Nicole Johns, RN. Today she is due to start the [facility] study protocol [place], where she will receive a priming dose of cyclophosphamide today, then pembrolizumab tomorrow and every 3 weeks thereafter. She has completed her preliminary studies and lab work as advised. Her magnesium was critical, so she received a 2g replacement yesterday.  REVIEW OF SYSTEMS:  [name] complains of ear congestion. She visited an ENT who disimpacted her left ear and told her that she produces more wax than most. She was advised to use a saline spray and gel to her nares. She denies fevers, chills, nausea, or vomiting. She is constipated and drinks prune juice and uses stool softeners to relieve this. Her appetite is fair. She has gained 4lbs since her last visit. She believes this is due to her lack of intake of mangoes which is reportedly a metabolism booster. She is mildly fatigued. She denies shortness of breath, chest pain , cough, or palpitations.  A detailed review of systems is otherwise  stable.  PAST MEDICAL HISTORY: Past Medical History  Diagnosis Date  . Complication of anesthesia     her father and uncle both had very  difficult time waking up-was  something they told them may be hereditory. she will find out.  [name] Hypothyroidism   . 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 Radiation 01/21/13-03/06/13    Right Breast  . Breast cancer ([place]) dx'd 04-10-12-rt    PAST SURGICAL HISTORY: Past Surgical History  Procedure Laterality Date  . Wisdom tooth extraction    . Portacath placement  04/22/2012    Procedure: INSERTION PORT-A-CATH;  Surgeon: [name], MD;  Location: [place];  Service: General;  Laterality: Left;  [name] Modified mastectomy Right 11/18/2012    Procedure:  RIGHT MODIFIED MASTECTOMY;  Surgeon: [name], MD;  Location: [place];  Service: General;  Laterality: Right;  . Knee arthroscopy Right 11/26/2013  . Port-a-cath removal Left 12/10/2013    Procedure: MINOR REMOVAL PORT-A-CATH;  Surgeon: [name], MD;  Location: [place];  Service: General;  Laterality: Left;    FAMILY HISTORY Family History  Problem Relation Age of Onset  . Lung cancer Maternal Grandfather 52    smoker  . Prostate cancer Maternal Grandfather 90  . Throat cancer Other     Great Aunt x 2  . Liver cancer Other     Maternal Great Grandmother  . Melanoma Maternal Uncle 81  . Brain cancer Cousin 11    non-malignant  . Allergic rhinitis Neg Hx   . Asthma Neg Hx   . Eczema Neg Hx   . Urticaria Neg Hx   the patient's parents are living, in their early 70s. The patient has 2 brothers, no sisters. The patient's mother was diagnosed with breast cancer, HER-2 positive, in 2014 in [place].  GYNECOLOGIC HISTORY:   (Updated 10/03/2013) Menarche age 17, first live birth age 60. She is GX P4. LMP January 2014. 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  The children are currently ages 26, 57, 44, and 24. Her husband, Nicole Valentine, is a Customer service manager.He just started a job for  [employer].  They attend the [employer] DIRECTIVES: The patient's husband is her HCPOA   HEALTH MAINTENANCE:  (Updated 10/03/2013) Social History  Substance Use Topics  . Smoking status: Never Smoker   . Smokeless tobacco: Never Used  . Alcohol Use: No     Colonoscopy: Never  PAP: November 2013/Dr. Smith  Bone density: January 2015, [facility], normal  Lipid panel:  Not on file  Allergies  Allergen Reactions  . Almond Meal Rash  . Almond Oil Rash  . Other Rash    LOTIONS-unknown type  . Tegaderm Ag Mesh [Silver] Rash  . Vancomycin Rash    Red Man Syndrome    Current Outpatient Prescriptions  Medication Sig Dispense Refill  . cholecalciferol (VITAMIN D) 1000 UNITS tablet Take 2,000 Units by mouth 2 (two) times daily.     [name] dextromethorphan-guaiFENesin (MUCINEX DM) 30-600 MG 12hr tablet Take 1 tablet by mouth 2 (two) times daily as needed for cough.    . levothyroxine (SYNTHROID, LEVOTHROID) 175 MCG tablet Take 175 mcg by mouth daily before breakfast.    . potassium chloride (MICRO-K) 10 MEQ CR capsule Take 2 capsules (20 mEq total) by mouth 2 (two)  times daily. 60 capsule 3  . Saline 0.2 % GEL Place 1 Squirt into the nose at bedtime.    . sodium chloride (OCEAN) 0.65 % SOLN nasal spray Place 1 spray into both nostrils QID.    [name] dexamethasone (DECADRON) 4 MG tablet Take 2 tablets (8 mg total) by mouth 2 (two) times daily with a meal. Starting on day 2 of treatment. Take for 3 days, then stop. (Patient not taking: Reported on 07/28/2015) 30 tablet 1  . fluconazole (DIFLUCAN) 100 MG tablet Take 1 tablet (100 mg total) by mouth daily. (Patient not taking: Reported on 07/05/2015) 7 tablet 1  . loratadine-pseudoephedrine (CLARITIN-D 12-HOUR) 5-120 MG tablet Take 1 tablet by mouth 2 (two) times daily. Reported on 07/28/2015    . LORazepam (ATIVAN) 0.5 MG tablet Take 1 tablet (0.5 mg total) by mouth at bedtime as needed (Nausea or vomiting). 60 tablet 2  .  pantoprazole (PROTONIX) 40 MG tablet Take 1 tablet (40 mg total) by mouth daily at 12 noon. 30 tablet 2   No current facility-administered medications for this visit.   Facility-Administered Medications Ordered in Other Visits  Medication Dose Route Frequency Provider Last Rate Last Dose  . sodium chloride flush (NS) 0.9 % injection 10 mL  10 mL Intracatheter PRN Chauncey Cruel, MD   10 mL at 07/28/15 1210      OBJECTIVE: Middle-aged white woman who appears stated age  18 Vitals:   07/28/15 0928  BP: 132/78  Pulse: 105  Temp: 98.1 F (36.7 C)  Resp: 18     Body mass index is 20.72 kg/(m^2).    ECOG FS:1 - Symptomatic but completely ambulatory Filed Weights   07/28/15 0928  Weight: 132 lb 4.8 oz (60.011 kg)    Sclerae unicteric, pupils round and equal Oropharynx clear and moist-- no thrush or other lesions No cervical or supraclavicular adenopathy Lungs no rales or rhonchi Heart regular rate and rhythm Abd soft, nontender, positive bowel sounds MSK no focal spinal tenderness, no upper extremity lymphedema Neuro: nonfocal, well oriented, appropriate affect Breasts: deferred  LAB RESULTS:   Lab Results  Component Value Date   WBC 5.1 07/26/2015   NEUTROABS 4.3 07/26/2015   HGB 10.0* 07/26/2015   HCT 30.6* 07/26/2015   MCV 103.3* 07/26/2015   PLT 234 07/26/2015      Chemistry      Component Value Date/Time   NA 142 07/26/2015 1148   NA 137 04/21/2015 0546   K 3.9 07/26/2015 1148   K 3.5 04/21/2015 0546   CL 101 04/21/2015 0546   CL 101 10/18/2012 0859   CO2 32* 07/26/2015 1148   CO2 28 04/21/2015 0546   BUN 16.5 07/26/2015 1148   BUN 10 04/21/2015 0546   CREATININE 0.6 07/26/2015 1148   CREATININE 0.48 04/21/2015 0546      Component Value Date/Time   CALCIUM 9.3 07/26/2015 1148   CALCIUM 8.3* 04/21/2015 0546   ALKPHOS 160* 07/26/2015 1148   ALKPHOS 94 04/21/2015 0546   AST 18 07/26/2015 1148   AST 17 04/21/2015 0546   ALT 10 07/26/2015 1148    ALT 10* 04/21/2015 0546   BILITOT 0.45 07/26/2015 1148   BILITOT 0.6 04/21/2015 0546       STUDIES: Ct Chest W Contrast  07/14/2015  CLINICAL DATA:  Restaging breast cancer diagnosed in 2013 with metastatic disease to bone, liver and right lung. Chemotherapy ongoing. Recist protocol patient. EXAM: CT CHEST, ABDOMEN, AND PELVIS WITH CONTRAST TECHNIQUE: Multidetector  CT imaging of the chest, abdomen and pelvis was performed following the standard protocol during bolus administration of intravenous contrast. CONTRAST:  157m OMNIPAQUE IOHEXOL 300 MG/ML  SOLN COMPARISON:  Prior CTs 04/19/2015.  PET-CT 03/17/2015. FINDINGS: RECIST 1.1 Target Lesions: 1. Right hilar node measures 8 mm on image 25 (previously 12 mm). 2. Subcarinal node measures 10 mm on image 29 (previously 10 mm). 3. Right retrocrural node measures 10 mm on image 54 (previously 13 mm). 4. Central hepatic lesion measures 2.4 cm on image 5 of series 4 (previously 2.1 cm). 5. Right adrenal lesion measures 2.2 cm on image 58 (previously 3.1 cm). Non-target Lesions: 1. Right pleural disease, subjectively improved, although difficult to measure. 2. Right lung airspace disease, subjectively improved. 3. Other hepatic metastases, mixed response. CT CHEST Mediastinum/Nodes: Previously demonstrated hypermetabolic right supraclavicular lymph node is incompletely visualized, although may be slightly smaller, measuring 6 mm on image 1. There are no enlarged axillary or internal mammary lymph nodes. There are multiple small mediastinal and hilar lymph nodes which have improved compared with the prior study. These are difficult to accurately measure given their confluent nature. A right hilar node which previously measured 12 mm now measures 8 mm on image 25. A subcarinal node which previously measured 10 mm measures 10 mm on image 29. No progressive adenopathy. The heart size is stable. There is a stable small pericardial effusion. Left IJ Port-A-Cath extends  to the cavoatrial junction. Lungs/Pleura: The complex loculated right pleural effusion has mildly decreased in volume status post interval thoracentesis. There is still diffuse right pleural thickening and enhancement, although the focal nodularity previously noted at the posterior costophrenic angle has improved. Again, this is not easily measurable given its irregular shape. A small amount of simple dependent pleural fluid on the left appears unchanged. There has been interval improved aeration of the right middle and lower lobes with persistent volume loss, air bronchograms and septal thickening. No measurable lesion is seen within the right middle lobe. No suspicious findings in the left lung. There is a stable tiny left upper lobe nodule measuring 3 mm on image 20. There is stable dependent left lower lobe atelectasis. Musculoskeletal/Chest wall: Right mastectomy and axillary node dissection. No evidence of chest wall recurrence or suspicious osseous finding. The T9 superior endplate compression deformity is unchanged. CT ABDOMEN AND PELVIS FINDINGS Hepatobiliary: The early images through the liver were obtained prior to opacification of the hepatic veins, limiting parenchymal assessment. The individual lesions are probably best measured on the delayed images (series 5). The largest lesion centrally in the liver measures 2.4 x 1.7 cm on image number 5 (previously 2.1 x 1.4 cm). The other smaller lesions demonstrate a mixed response. For example, the previously referenced lesions appear smaller, including a right lobe lesion (9 mm on image 12, previously 15 mm) and the left lobe lesion on image number 4 (6 mm, previously 10 mm). Other lesions appear larger, including a 10 mm lesion in segment 7 on image 5. The gallbladder appears normal. There is no biliary dilatation. Pancreas: Unremarkable. No pancreatic ductal dilatation or surrounding inflammatory changes. Spleen: Normal in size without focal abnormality.  Adrenals/Urinary Tract: The right adrenal lesion appears smaller, measuring 2.2 x 1.2 cm on image 58 (previously 3.1 x 1.3 cm. The left adrenal gland appears normal. The kidneys appear normal without evidence of urinary tract calculus, suspicious lesion or hydronephrosis. No bladder abnormalities are seen. Stomach/Bowel: No evidence of bowel wall thickening, distention or surrounding inflammatory change. The  pneumatosis and free air demonstrated previously have resolved. Vascular/Lymphatic: Right retrocrural node measures 10 mm on image 54 (previously 13 mm). No other enlarged abdominal or pelvic lymph nodes are seen. Stable atherosclerosis of the aorta, its branches and the iliac arteries. Reproductive: The uterus and ovaries appear unremarkable. No adnexal mass. Other: No ascites or peritoneal nodularity. Musculoskeletal: No acute or significant osseous findings. IMPRESSION: 1. Interval improvement in mediastinal lymphadenopathy and nodular pleural disease on the right. Residual disease is difficult to measure given its confluent nature. Moderate size complex right pleural effusion has mildly decreased in volume. 2. Interval improved right lung airspace disease. Remaining density may reflect combination of lymphangitic tumor, atelectasis and postobstructive pneumonitis. 3. Mixed response of hepatic metastatic disease. 4. The right adrenal metastasis is slightly smaller. 5. Interval resolution of pneumoperitoneum and pneumatosis. Electronically Signed   By: Richardean Sale M.D.   On: 07/14/2015 15:40   Mr Jeri Cos WP Contrast  07/02/2015  CLINICAL DATA:  50 year old female with metastatic breast cancer status post brain SRS in July 2016, and subsequent whole brain radiation in November 2016. Subsequent encounter. EXAM: MRI HEAD WITHOUT AND WITH CONTRAST TECHNIQUE: Multiplanar, multiecho pulse sequences of the brain and surrounding structures were obtained without and with intravenous contrast. CONTRAST:  51m  MULTIHANCE GADOBENATE DIMEGLUMINE 529 MG/ML IV SOLN COMPARISON:  03/16/2015 and earlier. FINDINGS: Previously seen numerous small enhancing cerebellar metastases have resolved on post-contrast imaging. Bilateral enhancing cerebral metastases demonstrated in October also appear resolved, including the gyriform left posterior frontal lobe lesion (would beyond series 10, image 115 today). No abnormal enhancement of the brain today. No dural thickening identified. No restricted diffusion to suggest acute infarction. No midline shift, mass effect, evidence of mass lesion, ventriculomegaly, extra-axial collection or acute intracranial hemorrhage. Cervicomedullary junction and pituitary are within normal limits. Subtle interval cerebral volume loss. Major intracranial vascular flow voids are stable and within normal limits. No abnormal gray or white matter signal today. No chronic cerebral blood products. Negative visualized cervical spinal cord. Stable to improved skull and visualized cervical spine bone marrow signal, no destructive osseous lesion identified. Visible internal auditory structures appear normal. There are mild bilateral mastoid effusions now. Negative nasopharynx. Trace paranasal sinus mucosal thickening is stable. Orbit and scalp soft tissues are stable. IMPRESSION: 1. Numerous small brain metastases appear completely resolved following whole brain radiation. 2. Mild mastoid effusions. Electronically Signed   By: HGenevie AnnM.D.   On: 07/02/2015 10:44   Nm Bone Scan Whole Body  07/22/2015  CLINICAL DATA:  Breast malignancy, right groin pain, no history of trauma EXAM: NUCLEAR MEDICINE WHOLE BODY BONE SCAN TECHNIQUE: Whole body anterior and posterior images were obtained approximately 3 hours after intravenous injection of radiopharmaceutical. RADIOPHARMACEUTICALS:  26.5 mCi Technetium-912mDP IV COMPARISON:  PET-CT study of March 17, 2015 FINDINGS: There is adequate uptake of the radiopharmaceutical by  the skeleton. There is adequate soft tissue clearance and renal activity. Uptake within the calvarium is normal. Uptake within the cervical spine reveals a focus posterior likely within the spinous process of T7. There is a small focus of increased uptake in the pedicle of L1. There is increased uptake within the left aspect of the manubrium and the lowermost sternal segment. There is asymmetrically increased uptake in the humeral head on the left as compared to the right. There is mildly increased uptake in the roof of the right acetabulum and in the right femoral head. Activity more distally in the lower extremities is normal with  exception of a tiny focus of uptake associated with the right knee. IMPRESSION: 1. Abnormally increased uptake within the left humeral head, the right femoral head, the roof of the right acetabulum is suspicious for metastatic disease. Plain film evaluation of these regions is recommended. 2. Activity within the spine and sternum is most likely degenerative in nature. Electronically Signed   By: David  Martinique M.D.   On: 07/22/2015 13:30   Ct Abdomen Pelvis W Contrast  07/14/2015  CLINICAL DATA:  Restaging breast cancer diagnosed in 2013 with metastatic disease to bone, liver and right lung. Chemotherapy ongoing. Recist protocol patient. EXAM: CT CHEST, ABDOMEN, AND PELVIS WITH CONTRAST TECHNIQUE: Multidetector CT imaging of the chest, abdomen and pelvis was performed following the standard protocol during bolus administration of intravenous contrast. CONTRAST:  167m OMNIPAQUE IOHEXOL 300 MG/ML  SOLN COMPARISON:  Prior CTs 04/19/2015.  PET-CT 03/17/2015. FINDINGS: RECIST 1.1 Target Lesions: 1. Right hilar node measures 8 mm on image 25 (previously 12 mm). 2. Subcarinal node measures 10 mm on image 29 (previously 10 mm). 3. Right retrocrural node measures 10 mm on image 54 (previously 13 mm). 4. Central hepatic lesion measures 2.4 cm on image 5 of series 4 (previously 2.1 cm). 5. Right  adrenal lesion measures 2.2 cm on image 58 (previously 3.1 cm). Non-target Lesions: 1. Right pleural disease, subjectively improved, although difficult to measure. 2. Right lung airspace disease, subjectively improved. 3. Other hepatic metastases, mixed response. CT CHEST Mediastinum/Nodes: Previously demonstrated hypermetabolic right supraclavicular lymph node is incompletely visualized, although may be slightly smaller, measuring 6 mm on image 1. There are no enlarged axillary or internal mammary lymph nodes. There are multiple small mediastinal and hilar lymph nodes which have improved compared with the prior study. These are difficult to accurately measure given their confluent nature. A right hilar node which previously measured 12 mm now measures 8 mm on image 25. A subcarinal node which previously measured 10 mm measures 10 mm on image 29. No progressive adenopathy. The heart size is stable. There is a stable small pericardial effusion. Left IJ Port-A-Cath extends to the cavoatrial junction. Lungs/Pleura: The complex loculated right pleural effusion has mildly decreased in volume status post interval thoracentesis. There is still diffuse right pleural thickening and enhancement, although the focal nodularity previously noted at the posterior costophrenic angle has improved. Again, this is not easily measurable given its irregular shape. A small amount of simple dependent pleural fluid on the left appears unchanged. There has been interval improved aeration of the right middle and lower lobes with persistent volume loss, air bronchograms and septal thickening. No measurable lesion is seen within the right middle lobe. No suspicious findings in the left lung. There is a stable tiny left upper lobe nodule measuring 3 mm on image 20. There is stable dependent left lower lobe atelectasis. Musculoskeletal/Chest wall: Right mastectomy and axillary node dissection. No evidence of chest wall recurrence or suspicious  osseous finding. The T9 superior endplate compression deformity is unchanged. CT ABDOMEN AND PELVIS FINDINGS Hepatobiliary: The early images through the liver were obtained prior to opacification of the hepatic veins, limiting parenchymal assessment. The individual lesions are probably best measured on the delayed images (series 5). The largest lesion centrally in the liver measures 2.4 x 1.7 cm on image number 5 (previously 2.1 x 1.4 cm). The other smaller lesions demonstrate a mixed response. For example, the previously referenced lesions appear smaller, including a right lobe lesion (9 mm on image 12, previously 15  mm) and the left lobe lesion on image number 4 (6 mm, previously 10 mm). Other lesions appear larger, including a 10 mm lesion in segment 7 on image 5. The gallbladder appears normal. There is no biliary dilatation. Pancreas: Unremarkable. No pancreatic ductal dilatation or surrounding inflammatory changes. Spleen: Normal in size without focal abnormality. Adrenals/Urinary Tract: The right adrenal lesion appears smaller, measuring 2.2 x 1.2 cm on image 58 (previously 3.1 x 1.3 cm. The left adrenal gland appears normal. The kidneys appear normal without evidence of urinary tract calculus, suspicious lesion or hydronephrosis. No bladder abnormalities are seen. Stomach/Bowel: No evidence of bowel wall thickening, distention or surrounding inflammatory change. The pneumatosis and free air demonstrated previously have resolved. Vascular/Lymphatic: Right retrocrural node measures 10 mm on image 54 (previously 13 mm). No other enlarged abdominal or pelvic lymph nodes are seen. Stable atherosclerosis of the aorta, its branches and the iliac arteries. Reproductive: The uterus and ovaries appear unremarkable. No adnexal mass. Other: No ascites or peritoneal nodularity. Musculoskeletal: No acute or significant osseous findings. IMPRESSION: 1. Interval improvement in mediastinal lymphadenopathy and nodular pleural  disease on the right. Residual disease is difficult to measure given its confluent nature. Moderate size complex right pleural effusion has mildly decreased in volume. 2. Interval improved right lung airspace disease. Remaining density may reflect combination of lymphangitic tumor, atelectasis and postobstructive pneumonitis. 3. Mixed response of hepatic metastatic disease. 4. The right adrenal metastasis is slightly smaller. 5. Interval resolution of pneumoperitoneum and pneumatosis. Electronically Signed   By: Richardean Sale M.D.   On: 07/14/2015 15:40    ASSESSMENT: 50 y.o. BRCA negative Arab woman with triple-negative stage IV breast cancer  (1) an status post right breast upper outer quadrant and right axillary lymph node biopsy 04/04/2012, both positive for an invasive ductal carcinoma, grade 3, triple negative, with an MIB-1 of 25%  (2) Treated neoadjuvantly with  (a) fluorouracil, cyclophosphamide, and epirubicin (at 100 mg/M2) x4 completed 06/07/2012  (b) docetaxel (75 mg/M2) for one dose, 06/21/2012, poorly tolerated  (c) carboplatin and gemcitabine given every 21 days for 6 cycles completed 10/11/2012  (3) status post right modified radical mastectomy 11/18/2012 showing a complete pathologic response--all 16 lymph nodes were benign  (a) no plans for reconstructon  (4) adjuvant radiation therapy completed 03/06/2013  METASTATIC DISEASE June 2016 (5) 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, 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  (6) staging PET scan 11/06/2014 showed significant disease in the middle and lower lobes of the Right lung, Right effusion, multiple liver and bone lesions, as well as left lung nodules, a Right adrenal nodule, and widespread hypermetabolic adenopathy  (a) brain MRI 11/16/2014 showed multiple cerebellar lesions  (7) RADIATION TREATMENTS thoracic metastases  (a) 01/28/2015-02/11/2015: Thoracic Spine T8-10, 30 Gy in 10 fractions  (7)  abraxane day 1 and day 8 of each 21 day cycle started 11/09/2014: last dose 03/12/2015 (6 cycles)  (a) PET scan 01/05/2015 shows an excellent initial response after 3 cycles  (b) PET scan 03/17/2015 shows progression  (8) zolendronate started 11/16/2014, to be repeated every 12 weeks, most recent dose 02/09/2015  (9) Foundation 1 study sent  11/06/2014: patient's sister in law Nicole Valentine following [phone])  (a) 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[place], NTRK1, MYC, ARID1A and LYN, none w approved therapies  (b) pazopanib, ponatinib or crizotinib suggested as possible off-protocol options  (10) RADIATION TREATMENTS: Brain metastases:  (a) s/p SRS therapy 12/11/2014 to 4 right cerebellar lesions  (b) multiple (18+) lesions noted on  repeat brain MRI 03/16/2015  (c) whole brain irradiation completed 04/12/2015  (11) uncontrolled pain: Started on OxyContin 10 mg twice a day with oxycodone 5 mg as needed 03/26/2015  (12) Right pleural effusion  (a) s/p thoracentesis July 2016 (x2), 03/30/2015  (13) started eribulin11/29/2016, originally planned to be given day one and day 8 of each 21 day cycle, but switched to every 14 days with onpro support because of persistent leukopenia. Discontinued 05/25/14, after 2 cycles, w no evidence of response  (14) cisplatin started 05/28/2015, to be repeated every 21 days. Last dose given 07/08/15. 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   PLAN: Shelonda is stable today, and is ready to begin treatment. We reviewed her baseline bone scan and echocardiogram. The labs drawn on Monday are sufficient for treatment today. She received 2g magnesium in IV fluids yesterday. She will begin day 1 cyclophosphamide as planned today. The dose will be given based on today's weight since she has gained a few pounds.   Lenette will return tomorrow for her first dose of pembrolizumab. We will have a magnesium level rechecked at that time, and proceed with another supplement if  necessary. She understands and agrees with this plan. She knows the goal of treatment in her case is control. She has been encouraged to call with any issues that might arise before her next visit here.   Laurie Panda, NP  07/28/2015

## 2015-07-28 NOTE — Progress Notes (Signed)
07/28/15 at 10:50am - Nicole Valentine 1525- The pt was into the cancer center this morning for her cycle 1, day 1 treatment.  The pt did not require any day 1 labs since her labs were all drawn on 07/26/15.  The pt's weight was obtained today for her cyclophosphamide dosing.  The pt was seen and examined today by Gentry Fitz, Dr. Virgie Dad NP.  The pt's concomitant medications were reviewed with the pt.  The pt said that she is only taking the following medications:   Vitamin D, Synthroid, Ativan, Protonix, potassium, and Mucinex.  The pt reports the following as baseline AE's:  mild fatigue, ear congestion, and mild constipation. The pt denies dyspnea, and she denies using any home oxygen.  The pt said that she just walked up 2 flights of steps at her child's school this morning and felt "alright".  ECOG =1.  The pt was informed about her bone scan results.  The research nurse gave Thane Edu, Rn the chemo administration sheet for the pt's treatment today.  The infusion nurse was informed to run the cyclophosphamide over 30-60 minutes.  The research nurse also confirmed with with the pharmacist, Evelena Peat, that the cyclophosphamide dose was based on today's weight.  The pt had no questions or concerns about her treatment today.  The pt is aware of her Day 2 appointments and her future appointments.   Brion Aliment RN, BSN, CCRP Clinical Research Nurse 07/28/2015 11:09 AM

## 2015-07-29 ENCOUNTER — Ambulatory Visit (HOSPITAL_BASED_OUTPATIENT_CLINIC_OR_DEPARTMENT_OTHER): Payer: BLUE CROSS/BLUE SHIELD

## 2015-07-29 ENCOUNTER — Encounter: Payer: Self-pay | Admitting: *Deleted

## 2015-07-29 ENCOUNTER — Telehealth: Payer: Self-pay

## 2015-07-29 ENCOUNTER — Other Ambulatory Visit (HOSPITAL_BASED_OUTPATIENT_CLINIC_OR_DEPARTMENT_OTHER): Payer: BLUE CROSS/BLUE SHIELD

## 2015-07-29 ENCOUNTER — Encounter: Payer: Self-pay | Admitting: Nurse Practitioner

## 2015-07-29 ENCOUNTER — Ambulatory Visit (HOSPITAL_BASED_OUTPATIENT_CLINIC_OR_DEPARTMENT_OTHER): Payer: BLUE CROSS/BLUE SHIELD | Admitting: Nurse Practitioner

## 2015-07-29 VITALS — BP 123/73 | HR 106 | Temp 98.1°F | Resp 18 | Ht 67.0 in | Wt 132.4 lb

## 2015-07-29 DIAGNOSIS — C7801 Secondary malignant neoplasm of right lung: Secondary | ICD-10-CM | POA: Diagnosis not present

## 2015-07-29 DIAGNOSIS — C50911 Malignant neoplasm of unspecified site of right female breast: Secondary | ICD-10-CM

## 2015-07-29 DIAGNOSIS — C7931 Secondary malignant neoplasm of brain: Secondary | ICD-10-CM

## 2015-07-29 DIAGNOSIS — Z5112 Encounter for antineoplastic immunotherapy: Secondary | ICD-10-CM

## 2015-07-29 DIAGNOSIS — C50919 Malignant neoplasm of unspecified site of unspecified female breast: Secondary | ICD-10-CM

## 2015-07-29 DIAGNOSIS — Z006 Encounter for examination for normal comparison and control in clinical research program: Secondary | ICD-10-CM | POA: Diagnosis not present

## 2015-07-29 DIAGNOSIS — C50411 Malignant neoplasm of upper-outer quadrant of right female breast: Secondary | ICD-10-CM

## 2015-07-29 DIAGNOSIS — C787 Secondary malignant neoplasm of liver and intrahepatic bile duct: Secondary | ICD-10-CM | POA: Diagnosis not present

## 2015-07-29 DIAGNOSIS — K668 Other specified disorders of peritoneum: Secondary | ICD-10-CM

## 2015-07-29 DIAGNOSIS — C7951 Secondary malignant neoplasm of bone: Secondary | ICD-10-CM

## 2015-07-29 LAB — CBC WITH DIFFERENTIAL/PLATELET
BASO%: 0.1 % (ref 0.0–2.0)
Basophils Absolute: 0 10*3/uL (ref 0.0–0.1)
EOS%: 0.3 % (ref 0.0–7.0)
Eosinophils Absolute: 0 10*3/uL (ref 0.0–0.5)
HEMATOCRIT: 29.8 % — AB (ref 34.8–46.6)
HEMOGLOBIN: 9.9 g/dL — AB (ref 11.6–15.9)
LYMPH#: 0.3 10*3/uL — AB (ref 0.9–3.3)
LYMPH%: 4.7 % — ABNORMAL LOW (ref 14.0–49.7)
MCH: 34.5 pg — AB (ref 25.1–34.0)
MCHC: 33.4 g/dL (ref 31.5–36.0)
MCV: 103.5 fL — ABNORMAL HIGH (ref 79.5–101.0)
MONO#: 0.4 10*3/uL (ref 0.1–0.9)
MONO%: 6.7 % (ref 0.0–14.0)
NEUT%: 88.2 % — ABNORMAL HIGH (ref 38.4–76.8)
NEUTROS ABS: 5.2 10*3/uL (ref 1.5–6.5)
Platelets: 216 10*3/uL (ref 145–400)
RBC: 2.88 10*6/uL — ABNORMAL LOW (ref 3.70–5.45)
RDW: 22.3 % — ABNORMAL HIGH (ref 11.2–14.5)
WBC: 5.9 10*3/uL (ref 3.9–10.3)

## 2015-07-29 LAB — RESEARCH LABS

## 2015-07-29 LAB — MAGNESIUM: Magnesium: 1.4 mg/dl — CL (ref 1.5–2.5)

## 2015-07-29 MED ORDER — SODIUM CHLORIDE 0.9 % IV SOLN
Freq: Once | INTRAVENOUS | Status: AC
  Administered 2015-07-29: 10:00:00 via INTRAVENOUS
  Filled 2015-07-29: qty 250

## 2015-07-29 MED ORDER — SODIUM CHLORIDE 0.9 % IV SOLN
Freq: Once | INTRAVENOUS | Status: DC
  Filled 2015-07-29: qty 250

## 2015-07-29 MED ORDER — SODIUM CHLORIDE 0.9% FLUSH
10.0000 mL | INTRAVENOUS | Status: DC | PRN
  Administered 2015-07-29: 10 mL
  Filled 2015-07-29: qty 10

## 2015-07-29 MED ORDER — MAGNESIUM OXIDE 400 MG PO CAPS: 400.0000 mg | ORAL_CAPSULE | Freq: Two times a day (BID) | ORAL | Status: DC

## 2015-07-29 MED ORDER — SODIUM CHLORIDE 0.9 % IV SOLN
200.0000 mg | Freq: Once | INTRAVENOUS | Status: AC
  Administered 2015-07-29: 200 mg via INTRAVENOUS
  Filled 2015-07-29: qty 8

## 2015-07-29 MED ORDER — SODIUM CHLORIDE 0.9 % IV SOLN
Freq: Once | INTRAVENOUS | Status: AC
  Administered 2015-07-29: 10:00:00 via INTRAVENOUS

## 2015-07-29 MED ORDER — HEPARIN SOD (PORK) LOCK FLUSH 100 UNIT/ML IV SOLN
500.0000 [IU] | Freq: Once | INTRAVENOUS | Status: AC | PRN
  Administered 2015-07-29: 500 [IU]
  Filled 2015-07-29: qty 5

## 2015-07-29 NOTE — Progress Notes (Signed)
07/29/15 at 4:02pm - [facility] 1525 - cycle 1, day 2 pembrolizumab infusion- The pt was into the cancer center this morning for her cycle 1, day 2 treatment.  The pt had her cbc/diff, research labs, and magnesium level (per physician discretion) drawn today.  The pt was also seen and examined by Gentry Fitz, Dr. Virgie Dad NP.   The pt said she tolerated her day 1 infusion well.  She specifically denied any nausea or vomiting.  She denies chest pain and mouth sores.  She denies any pain, and she is sleeping "okay".  The pt said that she did not want to eat last evening.  She said that she felt better this am once she ate some breakfast.  She said that she is coughing more since her ENT took her off claritan and mucinex.  The pt's magnesium was still low.  The pt received an IV infusion of magnesium today.  The pt was also prescribed oral magnesium supplements today.  The research nurse gave Dixie, infusion nurse, the infusion sign for the pembrolizumab administration.  The pt knows to call Dr. [name] for any questions/concerns.  The pt is aware of her cycle 2 appointments on 08/19/15.   Brion Aliment RN, BSN, CCRP Clinical Research Nurse 07/29/2015 4:53 PM

## 2015-07-29 NOTE — Patient Instructions (Signed)
Pembrolizumab injection What is this medicine? PEMBROLIZUMAB (pem broe liz ue mab) is a monoclonal antibody. It is used to treat melanoma and non-small cell lung cancer. This medicine may be used for other purposes; ask your health care provider or pharmacist if you have questions. What should I tell my health care provider before I take this medicine? They need to know if you have any of these conditions: -diabetes -immune system problems -inflammatory bowel disease -liver disease -lung or breathing disease -lupus -an unusual or allergic reaction to pembrolizumab, other medicines, foods, dyes, or preservatives -pregnant or trying to get pregnant -breast-feeding How should I use this medicine? This medicine is for infusion into a vein. It is given by a health care professional in a hospital or clinic setting. A special MedGuide will be given to you before each treatment. Be sure to read this information carefully each time. Talk to your pediatrician regarding the use of this medicine in children. Special care may be needed. Overdosage: If you think you have taken too much of this medicine contact a poison control center or emergency room at once. NOTE: This medicine is only for you. Do not share this medicine with others. What if I miss a dose? It is important not to miss your dose. Call your doctor or health care professional if you are unable to keep an appointment. What may interact with this medicine? Interactions have not been studied. Give your health care provider a list of all the medicines, herbs, non-prescription drugs, or dietary supplements you use. Also tell them if you smoke, drink alcohol, or use illegal drugs. Some items may interact with your medicine. This list may not describe all possible interactions. Give your health care provider a list of all the medicines, herbs, non-prescription drugs, or dietary supplements you use. Also tell them if you smoke, drink alcohol, or  use illegal drugs. Some items may interact with your medicine. What should I watch for while using this medicine? Your condition will be monitored carefully while you are receiving this medicine. You may need blood work done while you are taking this medicine. Do not become pregnant while taking this medicine or for 4 months after stopping it. Women should inform their doctor if they wish to become pregnant or think they might be pregnant. There is a potential for serious side effects to an unborn child. Talk to your health care professional or pharmacist for more information. Do not breast-feed an infant while taking this medicine or for 4 months after the last dose. What side effects may I notice from receiving this medicine? Side effects that you should report to your doctor or health care professional as soon as possible: -allergic reactions like skin rash, itching or hives, swelling of the face, lips, or tongue -bloody or black, tarry stools -breathing problems -change in the amount of urine -changes in vision -chest pain -chills -dark urine -dizziness or feeling faint or lightheaded -fast or irregular heartbeat -fever -flushing -hair loss -muscle pain -muscle weakness -persistent headache -signs and symptoms of high blood sugar such as dizziness; dry mouth; dry skin; fruity breath; nausea; stomach pain; increased hunger or thirst; increased urination -signs and symptoms of liver injury like dark urine, light-colored stools, loss of appetite, nausea, right upper belly pain, yellowing of the eyes or skin -stomach pain -weight loss Side effects that usually do not require medical attention (Report these to your doctor or health care professional if they continue or are bothersome.):constipation -cough -diarrhea -joint pain -  tiredness This list may not describe all possible side effects. Call your doctor for medical advice about side effects. You may report side effects to FDA at  1-800-FDA-1088. Where should I keep my medicine? This drug is given in a hospital or clinic and will not be stored at home. NOTE: This sheet is a summary. It may not cover all possible information. If you have questions about this medicine, talk to your doctor, pharmacist, or health care provider.    2016, [employer]. (2014-07-07 17:24:19)  

## 2015-07-29 NOTE — Telephone Encounter (Signed)
Called [facility] for chemotherapy F/U.  Patient is doing well.  Denies n/v.  Denies any new side effects or symptoms.  Bowel and bladder is functioning well.  Eating and drinking well and I instructed to drink 64 oz minimum daily or at least the day before, of and after treatment.  Denies questions at this time and encouraged to call if needed.  Reviewed how to call after hours in the case of an emergency.

## 2015-07-29 NOTE — Progress Notes (Signed)
And a Patient ID: Nicole Valentine, female   DOB: [birth year], 50 y.o.   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 She had a mammogram performed that showed dense breasts bilaterally. Ultrasound of the right breast showed 2.3 x 1.9 cm area of abnormality with multiple abnormal lymph nodes. MRI of the bilateral breasts showed asymmetrical enhancement throughout the right breast consistent with multicentric disease. An ultrasound-guided biopsy performed. The known area of disease measured 1.9 x 1.9 x 2.3 cm. Multiple abnormal positive right axillary lymph nodes were noted. 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 Biopsy of the right axillary lymph node was positive for malignancy with extracapsular extension. Patient was originally seen by Dr. [name] Dr. [name] and Dr. [name]. She has elected to have a right mastectomy eventually and declined biopsies of any other areas within the breast."  She went on to receive neoadjuvant chemotherapy and attained a complete pathologic response, as documented below  METASTATIC DISEASE: From the prior summary note: Nicole Valentine noted a very small right supraclavicular mass 09/25/2014, which she brought to our attention. She was having some allergy symptoms at the time so we decided to reevaluate this after a few weeks and on 10/22/2014 as the mass persisted we obtained a restaging neck CT scan, 10/28/2014. There was bilateral supraclavicular adenopathy, but also a large right pleural effusion was  incidentally noted. We proceeded to right thoracentesis 10/29/2014, and a liter of hazy yellow fluid was removed. Cytology from this procedure ([id number]) showed malignant cells consistent with adenocarcinoma, estrogen and progesterone receptor negative, HER-2 not amplified, with a signals ratio 1.12 and the number per cell being 2.75, and with an MIB-1 of 80%. I called Nicole Valentine with these results and set her up for a PET scan performed 11/06/2014, which shows widespread metastatic disease involving particularly the right lung, liver, and bones, but also the left lung, right adrenal, and multiple lymph node areas.  Her subsequent history is as detailed below.  INTERVAL HISTORY: Nicole Valentine returns today for follow-up of her metastatic triple negative breast cancer, accompanied by research nurse, Doristine Johns, RN. Today is day 2, cycle 1 of [facility] study protocol [facility] 1525. She recieved a priming dose of cyclophosphamide yesterday. Today she is due for pembrolizumab, which will be given every 3 weeks hereafter.   REVIEW OF SYSTEMS:  Nicole Valentine had a good day yesterday. In the past 24 hours she is somewhat more fatigued, but she slept well. Her appetite was down past lunch, so she did not have much for dinner. She is short of breath and has a slight cough but she believes this is due to being off of mucinex and claritin. She denies pain. Her right arm is slightly puffier. She had some lightheadedness when she woke up this morning, but this resolved after a bowl of cereal. She has "bone pain" to her right femur. It can be weak at times. She denies fevers, chills, nausea, or vomiting. She is constipated and is using stool softeners. A detailed review of systems is otherwise stable.   PAST MEDICAL HISTORY: Past Medical  History  Diagnosis Date  . Complication of anesthesia     her father and uncle both had very difficult time waking up-was  something they told them may be hereditory. she will find out.  [name]  Hypothyroidism   . 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 Radiation 01/21/13-03/06/13    Right Breast  . Breast cancer ([facility]) dx'd 04-10-12-rt    PAST SURGICAL HISTORY: Past Surgical History  Procedure Laterality Date  . Wisdom tooth extraction    . Portacath placement  04/22/2012    Procedure: INSERTION PORT-A-CATH;  Surgeon: [name], MD;  Location: [facility];  Service: General;  Laterality: Left;  [name] Modified mastectomy Right 11/18/2012    Procedure:  RIGHT MODIFIED MASTECTOMY;  Surgeon: [name], MD;  Location: [facility];  Service: General;  Laterality: Right;  . Knee arthroscopy Right 11/26/2013  . Port-a-cath removal Left 12/10/2013    Procedure: MINOR REMOVAL PORT-A-CATH;  Surgeon: [name], MD;  Location: [facility];  Service: General;  Laterality: Left;    FAMILY HISTORY Family History  Problem Relation Age of Onset  . Lung cancer Maternal Grandfather 80    smoker  . Prostate cancer Maternal Grandfather 90  . Throat cancer Other     Great Aunt x 2  . Liver cancer Other     Maternal Great Grandmother  . Melanoma Maternal Uncle 81  . Brain cancer Cousin 11    non-malignant  . Allergic rhinitis Neg Hx   . Asthma Neg Hx   . Eczema Neg Hx   . Urticaria Neg Hx   the patient's parents are living, in their early 87s. The patient has 2 brothers, no sisters. The patient's mother was diagnosed with breast cancer, HER-2 positive, in 2014 in [place].  GYNECOLOGIC HISTORY:   (Updated 10/03/2013) Menarche age 58, first live birth age 64. She is GX P4. LMP January 2014. 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  The children are currently ages 76, 64, 11, and 11. Her husband, Nicole Valentine, is a Customer service manager.He just started a job for [employer].  They attend the [employer] DIRECTIVES: The patient's  husband is her HCPOA   HEALTH MAINTENANCE:  (Updated 10/03/2013) Social History  Substance Use Topics  . Smoking status: Never Smoker   . Smokeless tobacco: Never Used  . Alcohol Use: No     Colonoscopy: Never  PAP: November 2013/Dr. Smith  Bone density: January 2015, [facility], normal  Lipid panel:  Not on file  Allergies  Allergen Reactions  . Almond Meal Rash  . Almond Oil Rash  . Other Rash    LOTIONS-unknown type  . Tegaderm Ag Mesh [Silver] Rash  . Vancomycin Rash    Red Man Syndrome    Current Outpatient Prescriptions  Medication Sig Dispense Refill  . cholecalciferol (VITAMIN D) 1000 UNITS tablet Take 2,000 Units by mouth 2 (two) times daily.     [name] levothyroxine (SYNTHROID, LEVOTHROID) 175 MCG tablet Take 175 mcg by mouth daily before breakfast.    . LORazepam (ATIVAN) 0.5 MG tablet Take 1 tablet (0.5 mg total) by mouth at bedtime as needed (Nausea or vomiting). 60 tablet 2  . pantoprazole (PROTONIX) 40 MG tablet Take 1 tablet (40 mg total) by mouth daily at 12 noon. 30 tablet 2  . potassium  chloride (MICRO-K) 10 MEQ CR capsule Take 2 capsules (20 mEq total) by mouth 2 (two) times daily. 60 capsule 3  . Saline 0.2 % GEL Place 1 Squirt into the nose at bedtime.    . sodium chloride (OCEAN) 0.65 % SOLN nasal spray Place 1 spray into both nostrils QID.    [name] dextromethorphan-guaiFENesin (MUCINEX DM) 30-600 MG 12hr tablet Take 1 tablet by mouth 2 (two) times daily as needed for cough. Reported on 07/29/2015    . Magnesium Oxide 400 MG CAPS Take 1 capsule (400 mg total) by mouth 2 (two) times daily. 30 capsule 0   Current Facility-Administered Medications  Medication Dose Route Frequency Provider Last Rate Last Dose  . sodium chloride 0.9 % 250 mL with magnesium sulfate 2 g infusion   Intravenous Once Laurie Panda, NP       Facility-Administered Medications Ordered in Other Visits  Medication Dose Route Frequency Provider Last Rate Last Dose  . sodium chloride flush (NS)  0.9 % injection 10 mL  10 mL Intracatheter PRN Chauncey Cruel, MD   10 mL at 07/29/15 1221      OBJECTIVE: Middle-aged white woman who appears stated age  3 Vitals:   07/29/15 0902  BP: 123/73  Pulse: 106  Temp: 98.1 F (36.7 C)  Resp: 18     Body mass index is 20.73 kg/(m^2).    ECOG FS:1 - Symptomatic but completely ambulatory Filed Weights   07/29/15 0902  Weight: 132 lb 6.4 oz (60.056 kg)   Skin: warm, dry  HEENT: sclerae anicteric, conjunctivae pink, oropharynx clear. No thrush or mucositis.  Lymph Nodes: No cervical or supraclavicular lymphadenopathy  Lungs: clear to auscultation bilaterally, no rales, wheezes, or rhonci  Heart: regular rate and rhythm  Abdomen: round, soft, non tender, positive bowel sounds  Musculoskeletal: No focal spinal tenderness, no peripheral edema  Neuro: non focal, well oriented, positive affect  Breasts: deferred  LAB RESULTS:   Lab Results  Component Value Date   WBC 5.9 07/29/2015   NEUTROABS 5.2 07/29/2015   HGB 9.9* 07/29/2015   HCT 29.8* 07/29/2015   MCV 103.5* 07/29/2015   PLT 216 07/29/2015      Chemistry      Component Value Date/Time   NA 142 07/26/2015 1148   NA 137 04/21/2015 0546   K 3.9 07/26/2015 1148   K 3.5 04/21/2015 0546   CL 101 04/21/2015 0546   CL 101 10/18/2012 0859   CO2 32* 07/26/2015 1148   CO2 28 04/21/2015 0546   BUN 16.5 07/26/2015 1148   BUN 10 04/21/2015 0546   CREATININE 0.6 07/26/2015 1148   CREATININE 0.48 04/21/2015 0546      Component Value Date/Time   CALCIUM 9.3 07/26/2015 1148   CALCIUM 8.3* 04/21/2015 0546   ALKPHOS 160* 07/26/2015 1148   ALKPHOS 94 04/21/2015 0546   AST 18 07/26/2015 1148   AST 17 04/21/2015 0546   ALT 10 07/26/2015 1148   ALT 10* 04/21/2015 0546   BILITOT 0.45 07/26/2015 1148   BILITOT 0.6 04/21/2015 0546       STUDIES: Ct Chest W Contrast  07/14/2015  CLINICAL DATA:  Restaging breast cancer diagnosed in 2013 with metastatic disease to bone,  liver and right lung. Chemotherapy ongoing. Recist protocol patient. EXAM: CT CHEST, ABDOMEN, AND PELVIS WITH CONTRAST TECHNIQUE: Multidetector CT imaging of the chest, abdomen and pelvis was performed following the standard protocol during bolus administration of intravenous contrast. CONTRAST:  140m OMNIPAQUE IOHEXOL  300 MG/ML  SOLN COMPARISON:  Prior CTs 04/19/2015.  PET-CT 03/17/2015. FINDINGS: RECIST 1.1 Target Lesions: 1. Right hilar node measures 8 mm on image 25 (previously 12 mm). 2. Subcarinal node measures 10 mm on image 29 (previously 10 mm). 3. Right retrocrural node measures 10 mm on image 54 (previously 13 mm). 4. Central hepatic lesion measures 2.4 cm on image 5 of series 4 (previously 2.1 cm). 5. Right adrenal lesion measures 2.2 cm on image 58 (previously 3.1 cm). Non-target Lesions: 1. Right pleural disease, subjectively improved, although difficult to measure. 2. Right lung airspace disease, subjectively improved. 3. Other hepatic metastases, mixed response. CT CHEST Mediastinum/Nodes: Previously demonstrated hypermetabolic right supraclavicular lymph node is incompletely visualized, although may be slightly smaller, measuring 6 mm on image 1. There are no enlarged axillary or internal mammary lymph nodes. There are multiple small mediastinal and hilar lymph nodes which have improved compared with the prior study. These are difficult to accurately measure given their confluent nature. A right hilar node which previously measured 12 mm now measures 8 mm on image 25. A subcarinal node which previously measured 10 mm measures 10 mm on image 29. No progressive adenopathy. The heart size is stable. There is a stable small pericardial effusion. Left IJ Port-A-Cath extends to the cavoatrial junction. Lungs/Pleura: The complex loculated right pleural effusion has mildly decreased in volume status post interval thoracentesis. There is still diffuse right pleural thickening and enhancement, although the  focal nodularity previously noted at the posterior costophrenic angle has improved. Again, this is not easily measurable given its irregular shape. A small amount of simple dependent pleural fluid on the left appears unchanged. There has been interval improved aeration of the right middle and lower lobes with persistent volume loss, air bronchograms and septal thickening. No measurable lesion is seen within the right middle lobe. No suspicious findings in the left lung. There is a stable tiny left upper lobe nodule measuring 3 mm on image 20. There is stable dependent left lower lobe atelectasis. Musculoskeletal/Chest wall: Right mastectomy and axillary node dissection. No evidence of chest wall recurrence or suspicious osseous finding. The T9 superior endplate compression deformity is unchanged. CT ABDOMEN AND PELVIS FINDINGS Hepatobiliary: The early images through the liver were obtained prior to opacification of the hepatic veins, limiting parenchymal assessment. The individual lesions are probably best measured on the delayed images (series 5). The largest lesion centrally in the liver measures 2.4 x 1.7 cm on image number 5 (previously 2.1 x 1.4 cm). The other smaller lesions demonstrate a mixed response. For example, the previously referenced lesions appear smaller, including a right lobe lesion (9 mm on image 12, previously 15 mm) and the left lobe lesion on image number 4 (6 mm, previously 10 mm). Other lesions appear larger, including a 10 mm lesion in segment 7 on image 5. The gallbladder appears normal. There is no biliary dilatation. Pancreas: Unremarkable. No pancreatic ductal dilatation or surrounding inflammatory changes. Spleen: Normal in size without focal abnormality. Adrenals/Urinary Tract: The right adrenal lesion appears smaller, measuring 2.2 x 1.2 cm on image 58 (previously 3.1 x 1.3 cm. The left adrenal gland appears normal. The kidneys appear normal without evidence of urinary tract  calculus, suspicious lesion or hydronephrosis. No bladder abnormalities are seen. Stomach/Bowel: No evidence of bowel wall thickening, distention or surrounding inflammatory change. The pneumatosis and free air demonstrated previously have resolved. Vascular/Lymphatic: Right retrocrural node measures 10 mm on image 54 (previously 13 mm). No other enlarged abdominal  or pelvic lymph nodes are seen. Stable atherosclerosis of the aorta, its branches and the iliac arteries. Reproductive: The uterus and ovaries appear unremarkable. No adnexal mass. Other: No ascites or peritoneal nodularity. Musculoskeletal: No acute or significant osseous findings. IMPRESSION: 1. Interval improvement in mediastinal lymphadenopathy and nodular pleural disease on the right. Residual disease is difficult to measure given its confluent nature. Moderate size complex right pleural effusion has mildly decreased in volume. 2. Interval improved right lung airspace disease. Remaining density may reflect combination of lymphangitic tumor, atelectasis and postobstructive pneumonitis. 3. Mixed response of hepatic metastatic disease. 4. The right adrenal metastasis is slightly smaller. 5. Interval resolution of pneumoperitoneum and pneumatosis. Electronically Signed   By: Richardean Sale M.D.   On: 07/14/2015 15:40   Mr Jeri Cos EL Contrast  07/02/2015  CLINICAL DATA:  50 year old female with metastatic breast cancer status post brain SRS in July 2016, and subsequent whole brain radiation in November 2016. Subsequent encounter. EXAM: MRI HEAD WITHOUT AND WITH CONTRAST TECHNIQUE: Multiplanar, multiecho pulse sequences of the brain and surrounding structures were obtained without and with intravenous contrast. CONTRAST:  35m MULTIHANCE GADOBENATE DIMEGLUMINE 529 MG/ML IV SOLN COMPARISON:  03/16/2015 and earlier. FINDINGS: Previously seen numerous small enhancing cerebellar metastases have resolved on post-contrast imaging. Bilateral enhancing  cerebral metastases demonstrated in October also appear resolved, including the gyriform left posterior frontal lobe lesion (would beyond series 10, image 115 today). No abnormal enhancement of the brain today. No dural thickening identified. No restricted diffusion to suggest acute infarction. No midline shift, mass effect, evidence of mass lesion, ventriculomegaly, extra-axial collection or acute intracranial hemorrhage. Cervicomedullary junction and pituitary are within normal limits. Subtle interval cerebral volume loss. Major intracranial vascular flow voids are stable and within normal limits. No abnormal gray or white matter signal today. No chronic cerebral blood products. Negative visualized cervical spinal cord. Stable to improved skull and visualized cervical spine bone marrow signal, no destructive osseous lesion identified. Visible internal auditory structures appear normal. There are mild bilateral mastoid effusions now. Negative nasopharynx. Trace paranasal sinus mucosal thickening is stable. Orbit and scalp soft tissues are stable. IMPRESSION: 1. Numerous small brain metastases appear completely resolved following whole brain radiation. 2. Mild mastoid effusions. Electronically Signed   By: HGenevie AnnM.D.   On: 07/02/2015 10:44   Nm Bone Scan Whole Body  07/22/2015  CLINICAL DATA:  Breast malignancy, right groin pain, no history of trauma EXAM: NUCLEAR MEDICINE WHOLE BODY BONE SCAN TECHNIQUE: Whole body anterior and posterior images were obtained approximately 3 hours after intravenous injection of radiopharmaceutical. RADIOPHARMACEUTICALS:  26.5 mCi Technetium-960mDP IV COMPARISON:  PET-CT study of March 17, 2015 FINDINGS: There is adequate uptake of the radiopharmaceutical by the skeleton. There is adequate soft tissue clearance and renal activity. Uptake within the calvarium is normal. Uptake within the cervical spine reveals a focus posterior likely within the spinous process of T7. There is a  small focus of increased uptake in the pedicle of L1. There is increased uptake within the left aspect of the manubrium and the lowermost sternal segment. There is asymmetrically increased uptake in the humeral head on the left as compared to the right. There is mildly increased uptake in the roof of the right acetabulum and in the right femoral head. Activity more distally in the lower extremities is normal with exception of a tiny focus of uptake associated with the right knee. IMPRESSION: 1. Abnormally increased uptake within the left humeral head, the right femoral  head, the roof of the right acetabulum is suspicious for metastatic disease. Plain film evaluation of these regions is recommended. 2. Activity within the spine and sternum is most likely degenerative in nature. Electronically Signed   By: David  Martinique M.D.   On: 07/22/2015 13:30   Ct Abdomen Pelvis W Contrast  07/14/2015  CLINICAL DATA:  Restaging breast cancer diagnosed in 2013 with metastatic disease to bone, liver and right lung. Chemotherapy ongoing. Recist protocol patient. EXAM: CT CHEST, ABDOMEN, AND PELVIS WITH CONTRAST TECHNIQUE: Multidetector CT imaging of the chest, abdomen and pelvis was performed following the standard protocol during bolus administration of intravenous contrast. CONTRAST:  160m OMNIPAQUE IOHEXOL 300 MG/ML  SOLN COMPARISON:  Prior CTs 04/19/2015.  PET-CT 03/17/2015. FINDINGS: RECIST 1.1 Target Lesions: 1. Right hilar node measures 8 mm on image 25 (previously 12 mm). 2. Subcarinal node measures 10 mm on image 29 (previously 10 mm). 3. Right retrocrural node measures 10 mm on image 54 (previously 13 mm). 4. Central hepatic lesion measures 2.4 cm on image 5 of series 4 (previously 2.1 cm). 5. Right adrenal lesion measures 2.2 cm on image 58 (previously 3.1 cm). Non-target Lesions: 1. Right pleural disease, subjectively improved, although difficult to measure. 2. Right lung airspace disease, subjectively improved. 3.  Other hepatic metastases, mixed response. CT CHEST Mediastinum/Nodes: Previously demonstrated hypermetabolic right supraclavicular lymph node is incompletely visualized, although may be slightly smaller, measuring 6 mm on image 1. There are no enlarged axillary or internal mammary lymph nodes. There are multiple small mediastinal and hilar lymph nodes which have improved compared with the prior study. These are difficult to accurately measure given their confluent nature. A right hilar node which previously measured 12 mm now measures 8 mm on image 25. A subcarinal node which previously measured 10 mm measures 10 mm on image 29. No progressive adenopathy. The heart size is stable. There is a stable small pericardial effusion. Left IJ Port-A-Cath extends to the cavoatrial junction. Lungs/Pleura: The complex loculated right pleural effusion has mildly decreased in volume status post interval thoracentesis. There is still diffuse right pleural thickening and enhancement, although the focal nodularity previously noted at the posterior costophrenic angle has improved. Again, this is not easily measurable given its irregular shape. A small amount of simple dependent pleural fluid on the left appears unchanged. There has been interval improved aeration of the right middle and lower lobes with persistent volume loss, air bronchograms and septal thickening. No measurable lesion is seen within the right middle lobe. No suspicious findings in the left lung. There is a stable tiny left upper lobe nodule measuring 3 mm on image 20. There is stable dependent left lower lobe atelectasis. Musculoskeletal/Chest wall: Right mastectomy and axillary node dissection. No evidence of chest wall recurrence or suspicious osseous finding. The T9 superior endplate compression deformity is unchanged. CT ABDOMEN AND PELVIS FINDINGS Hepatobiliary: The early images through the liver were obtained prior to opacification of the hepatic veins,  limiting parenchymal assessment. The individual lesions are probably best measured on the delayed images (series 5). The largest lesion centrally in the liver measures 2.4 x 1.7 cm on image number 5 (previously 2.1 x 1.4 cm). The other smaller lesions demonstrate a mixed response. For example, the previously referenced lesions appear smaller, including a right lobe lesion (9 mm on image 12, previously 15 mm) and the left lobe lesion on image number 4 (6 mm, previously 10 mm). Other lesions appear larger, including a 10 mm lesion in  segment 7 on image 5. The gallbladder appears normal. There is no biliary dilatation. Pancreas: Unremarkable. No pancreatic ductal dilatation or surrounding inflammatory changes. Spleen: Normal in size without focal abnormality. Adrenals/Urinary Tract: The right adrenal lesion appears smaller, measuring 2.2 x 1.2 cm on image 58 (previously 3.1 x 1.3 cm. The left adrenal gland appears normal. The kidneys appear normal without evidence of urinary tract calculus, suspicious lesion or hydronephrosis. No bladder abnormalities are seen. Stomach/Bowel: No evidence of bowel wall thickening, distention or surrounding inflammatory change. The pneumatosis and free air demonstrated previously have resolved. Vascular/Lymphatic: Right retrocrural node measures 10 mm on image 54 (previously 13 mm). No other enlarged abdominal or pelvic lymph nodes are seen. Stable atherosclerosis of the aorta, its branches and the iliac arteries. Reproductive: The uterus and ovaries appear unremarkable. No adnexal mass. Other: No ascites or peritoneal nodularity. Musculoskeletal: No acute or significant osseous findings. IMPRESSION: 1. Interval improvement in mediastinal lymphadenopathy and nodular pleural disease on the right. Residual disease is difficult to measure given its confluent nature. Moderate size complex right pleural effusion has mildly decreased in volume. 2. Interval improved right lung airspace disease.  Remaining density may reflect combination of lymphangitic tumor, atelectasis and postobstructive pneumonitis. 3. Mixed response of hepatic metastatic disease. 4. The right adrenal metastasis is slightly smaller. 5. Interval resolution of pneumoperitoneum and pneumatosis. Electronically Signed   By: Richardean Sale M.D.   On: 07/14/2015 15:40    ASSESSMENT: 50 y.o. BRCA negative [place] woman with triple-negative stage IV breast cancer  (1) an status post right breast upper outer quadrant and right axillary lymph node biopsy 04/04/2012, both positive for an invasive ductal carcinoma, grade 3, triple negative, with an MIB-1 of 25%  (2) Treated neoadjuvantly with  (a) fluorouracil, cyclophosphamide, and epirubicin (at 100 mg/M2) x4 completed 06/07/2012  (b) docetaxel (75 mg/M2) for one dose, 06/21/2012, poorly tolerated  (c) carboplatin and gemcitabine given every 21 days for 6 cycles completed 10/11/2012  (3) status post right modified radical mastectomy 11/18/2012 showing a complete pathologic response--all 16 lymph nodes were benign  (a) no plans for reconstructon  (4) adjuvant radiation therapy completed 03/06/2013  METASTATIC DISEASE June 2016 (5) 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, 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  (6) staging PET scan 11/06/2014 showed significant disease in the middle and lower lobes of the Right lung, Right effusion, multiple liver and bone lesions, as well as left lung nodules, a Right adrenal nodule, and widespread hypermetabolic adenopathy  (a) brain MRI 11/16/2014 showed multiple cerebellar lesions  (7) RADIATION TREATMENTS thoracic metastases  (a) 01/28/2015-02/11/2015: Thoracic Spine T8-10, 30 Gy in 10 fractions  (7) abraxane day 1 and day 8 of each 21 day cycle started 11/09/2014: last dose 03/12/2015 (6 cycles)  (a) PET scan 01/05/2015 shows an excellent initial response after 3 cycles  (b) PET scan 03/17/2015 shows  progression  (8) zolendronate started 11/16/2014, to be repeated every 12 weeks, most recent dose 02/09/2015  (9) Foundation 1 study sent  11/06/2014: patient's sister in law Garvin Fila following ([phone])  (a) 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[place], NTRK1, MYC, ARID1A and LYN, none w approved therapies  (b) pazopanib, ponatinib or crizotinib suggested as possible off-protocol options  (10) RADIATION TREATMENTS: Brain metastases:  (a) s/p SRS therapy 12/11/2014 to 4 right cerebellar lesions  (b) multiple (18+) lesions noted on repeat brain MRI 03/16/2015  (c) whole brain irradiation completed 04/12/2015  (11) uncontrolled pain: Started on OxyContin 10 mg twice a day with oxycodone  5 mg as needed 03/26/2015  (12) Right pleural effusion  (a) s/p thoracentesis July 2016 (x2), 03/30/2015  (13) started eribulin11/29/2016, originally planned to be given day one and day 8 of each 21 day cycle, but switched to every 14 days with onpro support because of persistent leukopenia. Discontinued 05/25/14, after 2 cycles, w no evidence of response  (14) cisplatin started 05/28/2015, to be repeated every 21 days. Last dose given 07/08/15. 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   PLAN: Rmani is doing well today, and is ready to proceed with day 2 pembroluzimab as planned. The labs were reviewed in detail, her magnesium is still critically low. She will receive 2g via IV in the infusion room, then she will start a 445m supplement of magnesium oxide BID at home. She was warned that this may cause loose stools, but given that she is already constipated, this would not be a terrible outcome.   Although the study doe not require it, Dr. MJana Hakimwould like the patient to be seen next week. She will have a magnesium level repeated with that visit. She understands and agrees with this plan. She knows the goal of treatment in her case is control. She has been encouraged to call with any issues that might arise  before her next visit here.   HLaurie Panda NP  07/29/2015

## 2015-07-30 ENCOUNTER — Telehealth: Payer: Self-pay | Admitting: Oncology

## 2015-07-30 NOTE — Telephone Encounter (Signed)
Nicole Valentine and advised on 3.16...Marland Kitchenpt ok and aware

## 2015-08-02 ENCOUNTER — Other Ambulatory Visit: Payer: Self-pay | Admitting: Nurse Practitioner

## 2015-08-02 ENCOUNTER — Ambulatory Visit: Payer: BLUE CROSS/BLUE SHIELD | Attending: Oncology | Admitting: Physical Therapy

## 2015-08-02 ENCOUNTER — Telehealth: Payer: Self-pay

## 2015-08-02 ENCOUNTER — Telehealth: Payer: Self-pay | Admitting: Internal Medicine

## 2015-08-02 ENCOUNTER — Ambulatory Visit (HOSPITAL_COMMUNITY)
Admission: RE | Admit: 2015-08-02 | Discharge: 2015-08-02 | Disposition: A | Discharge status: Home / Self Care | Payer: BLUE CROSS/BLUE SHIELD | Source: Ambulatory Visit | Attending: Nurse Practitioner | Admitting: Nurse Practitioner

## 2015-08-02 ENCOUNTER — Other Ambulatory Visit: Payer: Self-pay | Admitting: Oncology

## 2015-08-02 ENCOUNTER — Ambulatory Visit (HOSPITAL_BASED_OUTPATIENT_CLINIC_OR_DEPARTMENT_OTHER): Payer: BLUE CROSS/BLUE SHIELD | Admitting: Nurse Practitioner

## 2015-08-02 ENCOUNTER — Other Ambulatory Visit (HOSPITAL_COMMUNITY)
Admission: RE | Admit: 2015-08-02 | Discharge: 2015-08-02 | Disposition: A | Discharge status: Home / Self Care | Payer: BLUE CROSS/BLUE SHIELD | Source: Ambulatory Visit | Attending: Oncology | Admitting: Oncology

## 2015-08-02 ENCOUNTER — Ambulatory Visit (HOSPITAL_BASED_OUTPATIENT_CLINIC_OR_DEPARTMENT_OTHER): Payer: BLUE CROSS/BLUE SHIELD

## 2015-08-02 VITALS — BP 137/83 | HR 110 | Temp 97.7°F | Resp 18 | Ht 67.0 in | Wt 134.7 lb

## 2015-08-02 DIAGNOSIS — E860 Dehydration: Secondary | ICD-10-CM

## 2015-08-02 DIAGNOSIS — C50411 Malignant neoplasm of upper-outer quadrant of right female breast: Secondary | ICD-10-CM

## 2015-08-02 DIAGNOSIS — J9811 Atelectasis: Secondary | ICD-10-CM | POA: Insufficient documentation

## 2015-08-02 DIAGNOSIS — C787 Secondary malignant neoplasm of liver and intrahepatic bile duct: Secondary | ICD-10-CM | POA: Insufficient documentation

## 2015-08-02 DIAGNOSIS — D7389 Other diseases of spleen: Secondary | ICD-10-CM | POA: Diagnosis not present

## 2015-08-02 DIAGNOSIS — E8809 Other disorders of plasma-protein metabolism, not elsewhere classified: Secondary | ICD-10-CM

## 2015-08-02 DIAGNOSIS — C7951 Secondary malignant neoplasm of bone: Secondary | ICD-10-CM | POA: Insufficient documentation

## 2015-08-02 DIAGNOSIS — J90 Pleural effusion, not elsewhere classified: Secondary | ICD-10-CM | POA: Diagnosis not present

## 2015-08-02 DIAGNOSIS — R918 Other nonspecific abnormal finding of lung field: Secondary | ICD-10-CM | POA: Diagnosis not present

## 2015-08-02 DIAGNOSIS — J910 Malignant pleural effusion: Secondary | ICD-10-CM

## 2015-08-02 DIAGNOSIS — R0602 Shortness of breath: Secondary | ICD-10-CM

## 2015-08-02 DIAGNOSIS — C7931 Secondary malignant neoplasm of brain: Secondary | ICD-10-CM

## 2015-08-02 DIAGNOSIS — K5900 Constipation, unspecified: Secondary | ICD-10-CM

## 2015-08-02 DIAGNOSIS — I890 Lymphedema, not elsewhere classified: Secondary | ICD-10-CM

## 2015-08-02 DIAGNOSIS — R221 Localized swelling, mass and lump, neck: Secondary | ICD-10-CM

## 2015-08-02 DIAGNOSIS — C50911 Malignant neoplasm of unspecified site of right female breast: Secondary | ICD-10-CM

## 2015-08-02 DIAGNOSIS — E46 Unspecified protein-calorie malnutrition: Secondary | ICD-10-CM

## 2015-08-02 LAB — CBC WITH DIFFERENTIAL/PLATELET
BASO%: 0 % (ref 0.0–2.0)
BASOS ABS: 0 10*3/uL (ref 0.0–0.1)
EOS%: 0.2 % (ref 0.0–7.0)
Eosinophils Absolute: 0 10*3/uL (ref 0.0–0.5)
HEMATOCRIT: 30.2 % — AB (ref 34.8–46.6)
HGB: 9.8 g/dL — ABNORMAL LOW (ref 11.6–15.9)
LYMPH%: 5.8 % — AB (ref 14.0–49.7)
MCH: 34.4 pg — AB (ref 25.1–34.0)
MCHC: 32.5 g/dL (ref 31.5–36.0)
MCV: 106 fL — ABNORMAL HIGH (ref 79.5–101.0)
MONO#: 0.5 10*3/uL (ref 0.1–0.9)
MONO%: 10 % (ref 0.0–14.0)
NEUT#: 4.4 10*3/uL (ref 1.5–6.5)
NEUT%: 84 % — AB (ref 38.4–76.8)
Platelets: 226 10*3/uL (ref 145–400)
RBC: 2.85 10*6/uL — AB (ref 3.70–5.45)
RDW: 20.4 % — ABNORMAL HIGH (ref 11.2–14.5)
WBC: 5.2 10*3/uL (ref 3.9–10.3)
lymph#: 0.3 10*3/uL — ABNORMAL LOW (ref 0.9–3.3)

## 2015-08-02 LAB — BRAIN NATRIURETIC PEPTIDE: B NATRIURETIC PEPTIDE 5: 30.2 pg/mL (ref 0.0–100.0)

## 2015-08-02 LAB — COMPREHENSIVE METABOLIC PANEL
ALT: <9 U/L (ref 0–55)
AST: 17 U/L (ref 5–34)
Albumin: 2.8 g/dL — ABNORMAL LOW (ref 3.5–5.0)
Alkaline Phosphatase: 124 U/L (ref 40–150)
Anion Gap: 9 mEq/L (ref 3–11)
BUN: 13.8 mg/dL (ref 7.0–26.0)
CALCIUM: 9.3 mg/dL (ref 8.4–10.4)
CHLORIDE: 96 meq/L — AB (ref 98–109)
CO2: 36 meq/L — AB (ref 22–29)
Creatinine: 0.6 mg/dL (ref 0.6–1.1)
EGFR: >90 mL/min/{1.73_m2} (ref >90)
Glucose: 98 mg/dl (ref 70–140)
POTASSIUM: 3.9 meq/L (ref 3.5–5.1)
SODIUM: 141 meq/L (ref 136–145)
Total Bilirubin: 0.38 mg/dL (ref 0.20–1.20)
Total Protein: 6.6 g/dL (ref 6.4–8.3)

## 2015-08-02 LAB — MAGNESIUM: MAGNESIUM: 1.4 mg/dL — AB (ref 1.5–2.5)

## 2015-08-02 MED ORDER — IOHEXOL 350 MG/ML SOLN
100.0000 mL | Freq: Once | INTRAVENOUS | Status: AC | PRN
  Administered 2015-08-02: 65.5 mL via INTRAVENOUS

## 2015-08-02 NOTE — Telephone Encounter (Signed)
Patient called this morning c/o SOB, and severe fatigue.  She states that last Thursday her oxygen levels dropped into the 80's and she has been on oxygen constantly since then- she is having to sit up in a chair to sleep at night, she is also struggling with fatigue, eating, drinking and has swelling in her arms and legs, right side greater then left. Patient to see Selena Lesser, NP in [facility] today.

## 2015-08-02 NOTE — Telephone Encounter (Signed)
Opened in error

## 2015-08-03 ENCOUNTER — Encounter: Payer: Self-pay | Admitting: *Deleted

## 2015-08-03 ENCOUNTER — Ambulatory Visit (HOSPITAL_COMMUNITY)
Admission: RE | Admit: 2015-08-03 | Discharge: 2015-08-03 | Disposition: A | Discharge status: Home / Self Care | Payer: BLUE CROSS/BLUE SHIELD | Source: Ambulatory Visit | Attending: General Surgery | Admitting: General Surgery

## 2015-08-03 ENCOUNTER — Telehealth: Payer: Self-pay | Admitting: Nurse Practitioner

## 2015-08-03 ENCOUNTER — Ambulatory Visit (HOSPITAL_COMMUNITY)
Admission: RE | Admit: 2015-08-03 | Discharge: 2015-08-03 | Disposition: A | Discharge status: Home / Self Care | Payer: BLUE CROSS/BLUE SHIELD | Source: Ambulatory Visit | Attending: Nurse Practitioner | Admitting: Nurse Practitioner

## 2015-08-03 ENCOUNTER — Telehealth: Payer: Self-pay | Admitting: Oncology

## 2015-08-03 ENCOUNTER — Encounter: Payer: Self-pay | Admitting: Nurse Practitioner

## 2015-08-03 DIAGNOSIS — C50411 Malignant neoplasm of upper-outer quadrant of right female breast: Secondary | ICD-10-CM | POA: Diagnosis present

## 2015-08-03 DIAGNOSIS — Z9889 Other specified postprocedural states: Secondary | ICD-10-CM

## 2015-08-03 DIAGNOSIS — E46 Unspecified protein-calorie malnutrition: Secondary | ICD-10-CM | POA: Insufficient documentation

## 2015-08-03 DIAGNOSIS — J90 Pleural effusion, not elsewhere classified: Secondary | ICD-10-CM

## 2015-08-03 DIAGNOSIS — K5900 Constipation, unspecified: Secondary | ICD-10-CM | POA: Insufficient documentation

## 2015-08-03 DIAGNOSIS — R0602 Shortness of breath: Secondary | ICD-10-CM

## 2015-08-03 DIAGNOSIS — E8809 Other disorders of plasma-protein metabolism, not elsewhere classified: Secondary | ICD-10-CM | POA: Insufficient documentation

## 2015-08-03 DIAGNOSIS — E860 Dehydration: Secondary | ICD-10-CM | POA: Diagnosis present

## 2015-08-03 LAB — PHOSPHORUS: PHOSPHORUS: 3.8 mg/dL (ref 2.5–4.5)

## 2015-08-03 NOTE — Assessment & Plan Note (Signed)
Albumin continues low at 2.8.  Patient was encouraged to push protein in her diet is much as possible.

## 2015-08-03 NOTE — Progress Notes (Signed)
SYMPTOM MANAGEMENT CLINIC   HPI: Nicole Valentine 50 y.o. female diagnosed with breast cancer; with metastasis to both the brain and the bone.  Patient is currently undergoing research trial L CCC 1525 which consist of both Cytoxan and Keytruda.    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  She typically uses on a 24/7 basis.  Patient states she's been doing fairly well with her respiratory status; but then developed some increased shortness of breath since this past weekend.  She states that she becomes much more short of breath when she tries to lay down.  She has been sleeping sitting up in a recliner for the past few days.  She denies any increased cough, chest pain, or chest pressure.  She feels that she cannot take a deep breath.  She denies any specific pain with inspiration.  She denies any recent fevers or chills.  Patient states that she checked her oxygen saturation while at home over the weekend and it was down to 88% on room air.  Prior to arrival to the [place].  Patient did undergo a chest x-ray which revealed increased bilateral pleural effusions.  Exam today revealed essentially clear breath sounds bilaterally; with diminished bases.  O2 was at 2 L via nasal cannula with O2 sat 96%.  Patient was afebrile on exam.  BNP obtained today was 30.2.  CT angiogram of the chest obtained later that afternoon revealed:  IMPRESSION: No evidence of pulmonary embolism.  Interval worsening of moderate size complex right pleural effusion with associated worsening consolidation over the right midlung and right lower lobe likely atelectasis although cannot exclude concomitant infection. Stable diffuse right pleural thickening as well as stable small nodes over the neck base and mediastinum in this patient with known metastatic breast cancer. Interval worsening moderate size left pleural effusion with associated basilar atelectasis.  Stable known  osseous, adrenal and liver metastases.  Sub cm hyperdense nodule over the spleen as cannot exclude underlying metastatic lesion. Recommend attention on follow-up.  Reviewed all findings with Dr. Lindi Adie on-call physician.  He advised scheduling a stat thoracentesis to be obtained as soon as possible tomorrow, 08/03/2015.   He also recommended that patient obtain a follow-up echo as well; since patient does have a history of decreased ejection fraction.  HPI  Review of Systems  Constitutional: Positive for malaise/fatigue. Negative for fever and chills.  Respiratory: Positive for shortness of breath. Negative for cough, hemoptysis, sputum production and wheezing.   Cardiovascular: Positive for orthopnea. Negative for chest pain, palpitations, claudication, leg swelling and PND.  Neurological: Positive for weakness.  All other systems reviewed and are negative.   Past Medical History  Diagnosis Date  . Complication of anesthesia     her father and uncle both had very difficult time waking up-was  something they told them may be hereditory. she will find out.  [name] Hypothyroidism   . 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 Radiation 01/21/13-03/06/13    Right Breast  . Breast cancer ([place]) dx'd 04-10-12-rt    Past Surgical History  Procedure Laterality Date  . Wisdom tooth extraction    . Portacath placement  04/22/2012    Procedure: INSERTION PORT-A-CATH;  Surgeon: [name], MD;  Location: [place];  Service: General;  Laterality: Left;  [name] Modified mastectomy Right 11/18/2012    Procedure:  RIGHT MODIFIED MASTECTOMY;  Surgeon: [name], MD;  Location: [place];  Service: General;  Laterality: Right;  . Knee arthroscopy Right 11/26/2013  . Port-a-cath removal Left 12/10/2013    Procedure: MINOR REMOVAL PORT-A-CATH;  Surgeon: [name], MD;  Location: [place];  Service: General;  Laterality: Left;    has Abdominal  wall anomaly; Breast cancer of upper-outer quadrant of right female breast ([place]); Lymphedema of arm; Knee pain, right; GERD (gastroesophageal reflux disease); History of breast cancer; Mass of neck; Breast cancer metastasized to bone [facility]); Breast cancer metastasized to multiple sites [facility]); Brain metastases ([place]); Acute respiratory failure with hypoxia ([place]); Malignant pleural effusion; SIRS (systemic inflammatory response syndrome) ([place]); Bone metastases ([place]); Cancer associated pain; Chronic rhinitis; Coughing; History of food allergy; Pneumoperitoneum; Malnutrition of moderate degree; Drug-induced neutropenia ([place]); Genetic testing; Constipation; Hypomagnesemia; and Hypoalbuminemia due to protein-calorie malnutrition ([place]) on her problem list.    is allergic to almond meal; almond oil; other; tegaderm ag mesh; and vancomycin.    Medication List       This list is accurate as of: 08/02/15 11:59 PM.  Always use your most recent med list.               cholecalciferol 1000 units tablet  Commonly known as:  VITAMIN D  Take 2,000 Units by mouth 2 (two) times daily.     dextromethorphan-guaiFENesin 30-600 MG 12hr tablet  Commonly known as:  MUCINEX DM  Take 1 tablet by mouth 2 (two) times daily as needed for cough. Reported on 07/29/2015     levothyroxine 175 MCG tablet  Commonly known as:  SYNTHROID, LEVOTHROID  Take 175 mcg by mouth daily before breakfast.     LORazepam 0.5 MG tablet  Commonly known as:  ATIVAN  Take 1 tablet (0.5 mg total) by mouth at bedtime as needed (Nausea or vomiting).     Magnesium Oxide 400 MG Caps  Take 1 capsule (400 mg total) by mouth 2 (two) times daily.     pantoprazole 40 MG tablet  Commonly known as:  PROTONIX  Take 1 tablet (40 mg total) by mouth daily at 12 noon.     potassium chloride 10 MEQ CR capsule  Commonly known as:  MICRO-K  Take 2 capsules (20 mEq total) by mouth 2 (two) times daily.     Saline 0.2 % Gel  Place 1 Squirt into the nose  at bedtime.     sodium chloride 0.65 % Soln nasal spray  Commonly known as:  OCEAN  Place 1 spray into both nostrils QID.         PHYSICAL EXAMINATION  Oncology Vitals 08/02/2015 07/29/2015  Height 170 cm 170 cm  Weight 61.1 kg 60.056 kg  Weight (lbs) 134 lbs 11 oz 132 lbs 6 oz  BMI (kg/m2) 21.1 kg/m2 20.74 kg/m2  Temp 97.7 98.1  Pulse 110 106  Resp 18 18  SpO2 96 97  BSA (m2) 1.7 m2 1.69 m2   BP Readings from Last 2 Encounters:  08/02/15 137/83  07/29/15 123/73    Physical Exam  Constitutional: She is oriented to person, place, and time. She appears malnourished. She appears unhealthy. She appears cachectic.  HENT:  Head: Normocephalic and atraumatic.  Mouth/Throat: Oropharynx is clear and moist.  Eyes: Conjunctivae and EOM are normal. Pupils are equal, round, and reactive to light. Right eye exhibits no discharge. Left eye exhibits no discharge. No scleral icterus.  Neck: Normal range of motion. Neck supple. No JVD present. No tracheal deviation present. No thyromegaly present.  Cardiovascular: Regular  rhythm, normal heart sounds and intact distal pulses.   Tachycardia  Pulmonary/Chest: No stridor. No respiratory distress. She has no wheezes. She has no rales. She exhibits no tenderness.  Patient with essentially clear breath sounds bilaterally; but diminished basis.  Patient was wearing a 2 at 2 L nasal cannula.  Abdominal: Soft. Bowel sounds are normal. She exhibits no distension and no mass. There is no tenderness. There is no rebound and no guarding.  Musculoskeletal: Normal range of motion. She exhibits edema. She exhibits no tenderness.  Apparent right upper extremity lymphedema.  Lymphadenopathy:    She has no cervical adenopathy.  Neurological: She is alert and oriented to person, place, and time.  Ambulates with assistance of a walker.  Skin: Skin is warm and dry. No rash noted. No erythema. There is pallor.  Psychiatric: Affect normal.    LABORATORY  DATA:. Hospital Outpatient Visit on 08/02/2015  Component Date Value Ref Range Status  . B Natriuretic Peptide 08/02/2015 30.2  0.0 - 100.0 pg/mL Final  Appointment on 08/02/2015  Component Date Value Ref Range Status  . WBC 08/02/2015 5.2  3.9 - 10.3 10e3/uL Final  . NEUT# 08/02/2015 4.4  1.5 - 6.5 10e3/uL Final  . HGB 08/02/2015 9.8* 11.6 - 15.9 g/dL Final  . HCT 08/02/2015 30.2* 34.8 - 46.6 % Final  . Platelets 08/02/2015 226  145 - 400 10e3/uL Final  . MCV 08/02/2015 106.0* 79.5 - 101.0 fL Final  . MCH 08/02/2015 34.4* 25.1 - 34.0 pg Final  . MCHC 08/02/2015 32.5  31.5 - 36.0 g/dL Final  . RBC 08/02/2015 2.85* 3.70 - 5.45 10e6/uL Final  . RDW 08/02/2015 20.4* 11.2 - 14.5 % Final  . lymph# 08/02/2015 0.3* 0.9 - 3.3 10e3/uL Final  . MONO# 08/02/2015 0.5  0.1 - 0.9 10e3/uL Final  . Eosinophils Absolute 08/02/2015 0.0  0.0 - 0.5 10e3/uL Final  . Basophils Absolute 08/02/2015 0.0  0.0 - 0.1 10e3/uL Final  . NEUT% 08/02/2015 84.0* 38.4 - 76.8 % Final  . LYMPH% 08/02/2015 5.8* 14.0 - 49.7 % Final  . MONO% 08/02/2015 10.0  0.0 - 14.0 % Final  . EOS% 08/02/2015 0.2  0.0 - 7.0 % Final  . BASO% 08/02/2015 0.0  0.0 - 2.0 % Final  . Sodium 08/02/2015 141  136 - 145 mEq/L Final  . Potassium 08/02/2015 3.9  3.5 - 5.1 mEq/L Final  . Chloride 08/02/2015 96* 98 - 109 mEq/L Final  . CO2 08/02/2015 36* 22 - 29 mEq/L Final  . Glucose 08/02/2015 98  70 - 140 mg/dl Final   Glucose reference range is for nonfasting patients. Fasting glucose reference range is 70- 100.  [name] BUN 08/02/2015 13.8  7.0 - 26.0 mg/dL Final  . Creatinine 08/02/2015 0.6  0.6 - 1.1 mg/dL Final  . Total Bilirubin 08/02/2015 0.38  0.20 - 1.20 mg/dL Final  . Alkaline Phosphatase 08/02/2015 124  40 - 150 U/L Final  . AST 08/02/2015 17  5 - 34 U/L Final  . ALT 08/02/2015 <9  0 - 55 U/L Final  . Total Protein 08/02/2015 6.6  6.4 - 8.3 g/dL Final  . Albumin 08/02/2015 2.8* 3.5 - 5.0 g/dL Final  . Calcium 08/02/2015 9.3  8.4 - 10.4  mg/dL Final  . Anion Gap 08/02/2015 9  3 - 11 mEq/L Final  . EGFR 08/02/2015 >90  >90 ml/min/1.73 m2 Final   eGFR is calculated using the CKD-EPI Creatinine Equation (2009)  . Phosphorus, Ser 08/02/2015 3.8  2.5 -  4.5 mg/dL Final  . Magnesium 08/02/2015 1.4* 1.5 - 2.5 mg/dl Final     RADIOGRAPHIC STUDIES: Dg Chest 2 View  08/02/2015  CLINICAL DATA:  Breast cancer, increased shortness of breath for 4 days, cough, has had thoracentesis performed several times before on RIGHT EXAM: CHEST  2 VIEW COMPARISON:  CT chest 07/14/2015 FINDINGS: LEFT jugular Port-A-Cath with tip projecting over SVC. Stable heart size and mediastinal contours. Large RIGHT and small LEFT pleural effusions, increased on LEFT and slightly increased on RIGHT. Significant atelectasis in lower lungs particularly on RIGHT. No pneumothorax. Post RIGHT mastectomy and axillary node dissection. No acute osseous lesions. IMPRESSION: Increased BILATERAL pleural effusions and basilar atelectasis since prior exam. Electronically Signed   By: Lavonia Dana M.D.   On: 08/02/2015 12:53   Ct Angio Chest Pe W/cm &/or Wo Cm  08/02/2015  CLINICAL DATA:  Shortness of breath and fatigued with hypoxia. Upper and lower extremity swelling right greater than left. History of metastatic breast cancer. EXAM: CT ANGIOGRAPHY CHEST WITH CONTRAST TECHNIQUE: Multidetector CT imaging of the chest was performed using the standard protocol during bolus administration of intravenous contrast. Multiplanar CT image reconstructions and MIPs were obtained to evaluate the vascular anatomy. CONTRAST:  65.66m OMNIPAQUE IOHEXOL 350 MG/ML SOLN COMPARISON:  07/14/2015 FINDINGS: Left-sided Port-A-Cath with tip in the SVC. Exam demonstrates slightly more pronounced 9 mm sub solid nodular opacity over the right upper lobe. Slight interval worsening of a moderate size complex right pleural effusion with diffuse pleural thickening. Slight interval worsening consolidation over the right  midlung and right lower lobe likely atelectatic change although cannot exclude concomitant infection. Interval worsening of moderate size left pleural effusion with associated atelectasis in the left base. Stable subpleural 2 mm nodule over the lingula. No evidence of pulmonary embolism. Slight interval worsening of small to moderate pericardial effusion. Stable mediastinal lymph nodes not as well defined on the current exam. Stable small nodes over the neck base/ supraclavicular regions. Remainder of the mediastinal unchanged. Limited images through the upper abdomen demonstrate evidence of known liver metastases without significant change. Slight increase in size of known adrenal metastasis measuring 1.4 x 3.2 cm (previously 1.2 x 2.2 cm). Sub cm hypervascular nodule over the spleen. Stable likely pathologic mild compression fracture of T10. Stable sclerotic focus of the base of the T1 spinous process. Review of the MIP images confirms the above findings. IMPRESSION: No evidence of pulmonary embolism. Interval worsening of moderate size complex right pleural effusion with associated worsening consolidation over the right midlung and right lower lobe likely atelectasis although cannot exclude concomitant infection. Stable diffuse right pleural thickening as well as stable small nodes over the neck base and mediastinum in this patient with known metastatic breast cancer. Interval worsening moderate size left pleural effusion with associated basilar atelectasis. Stable known osseous, adrenal and liver metastases. Sub cm hyperdense nodule over the spleen as cannot exclude underlying metastatic lesion. Recommend attention on follow-up. Electronically Signed   By: DMarin OlpM.D.   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  She typically uses on a 24/7 basis.  Patient states she's been doing fairly well with her  respiratory status; but then developed some increased shortness of breath since this past weekend.  She states that she becomes much more short of breath when she tries to lay down.  She has been sleeping sitting up in a recliner  for the past few days.  She denies any increased cough, chest pain, or chest pressure.  She feels that she cannot take a deep breath.  She denies any specific pain with inspiration.  She denies any recent fevers or chills.  Patient states that she checked her oxygen saturation while at home over the weekend and it was down to 88% on room air.  Prior to arrival to the [place].  Patient did undergo a chest x-ray which revealed increased bilateral pleural effusions.  Exam today revealed essentially clear breath sounds bilaterally; with diminished bases.  O2 was at 2 L via nasal cannula with O2 sat 96%.  Patient was afebrile on exam.  BNP obtained today was 30.2.  CT angiogram of the chest obtained later that afternoon revealed:  IMPRESSION: No evidence of pulmonary embolism.  Interval worsening of moderate size complex right pleural effusion with associated worsening consolidation over the right midlung and right lower lobe likely atelectasis although cannot exclude concomitant infection. Stable diffuse right pleural thickening as well as stable small nodes over the neck base and mediastinum in this patient with known metastatic breast cancer. Interval worsening moderate size left pleural effusion with associated basilar atelectasis.  Stable known osseous, adrenal and liver metastases.  Sub cm hyperdense nodule over the spleen as cannot exclude underlying metastatic lesion. Recommend attention on follow-up.  Reviewed all findings with Dr. Lindi Adie on-call physician.  He advised scheduling a stat thoracentesis to be obtained as soon as possible tomorrow, 08/03/2015.   He also recommended that patient obtain a follow-up echo as well; since patient does have  a history of decreased ejection fraction.   Lymphedema of arm Patient is status post right mastectomy in the past; and has a history of chronic lymphedema to the right upper extremity.  However, patient states she has noticed some mild increase in lymphedema to the right arm within the past several weeks.  She states that she has been unable to wear her lymphedema compression sleeve recently; that she has lost much weight.  She is awaiting the fitting of a new lymphedema sleeve shortly.  Hypomagnesemia Patient is suffering with chronic hypomagnesemia.  She has been receiving intermittent magnesium infusions while here at the cancer center.  She also continues to take for magnesium tablets per day.  Magnesium today was 1.4.  He can.  Patient states that she continues to suffer with some chronic constipation; so.  Advised patient to try increasing the magnesium to 6 tablets per day.  We'll continue to monitor.  Magnesium closely.  Hypoalbuminemia due to protein-calorie malnutrition ([facility]) Albumin continues low at 2.8.  Patient was encouraged to push protein in her diet is much as possible.  Constipation Patient has history of chronic constipation.  She states she has been trying Colace twice daily and occasional Miralax.  She has also been taking magnesium tablets for her chronic low magnesium.  She takes , magnesium tablets per day.  Advised patient that she should increase the magnesium tablets up to 6 tablets per day due to her chronic low magnesium.  Also, advised that she may need to increase the amount of miralax  that she takes to resolve the constipation issues.  Breast cancer of upper-outer quadrant of right female breast [facility]) Patient is status post right mastectomy, multiple chemotherapy regimens, and radiation treatments in the past.  She is currently undergoing research trial LCCC 1525 which consist of cytoxan and [place]. Pt received her first cycle of this trial regimen on  07/28/15.     Patient states that she has been experiencing increasing shortness of breath and fatigue/weakness for the past week or so.  She denies any recent fevers or chills.  See further notes for details.  On exam.-Patient does appear much more fatigued and frail today.  She was using a walker for ambulation and was wearing oxygen at 2 L via nasal cannula.  Patient is scheduled to return on 08/05/2015 for labs and follow-up visit.   Patient stated understanding of all instructions; and was in agreement with this plan of care. The patient knows to call the clinic with any problems, questions or concerns.   Review/collaboration with Dr. Lindi Adie on-call physician regarding all aspects of patient's visit today.   Total time spent with patient was 40 minutes;  with greater than 75 percent of that time spent in face to face counseling regarding patient's symptoms,  and coordination of care and follow up.  Disclaimer:This dictation was prepared with Dragon/digital dictation along with [employer]. Any transcriptional errors that result from this process are unintentional.  Drue Second, NP 08/03/2015

## 2015-08-03 NOTE — Assessment & Plan Note (Signed)
Patient is status post right mastectomy in the past; and has a history of chronic lymphedema to the right upper extremity.  However, patient states she has noticed some mild increase in lymphedema to the right arm within the past several weeks.  She states that she has been unable to wear her lymphedema compression sleeve recently; that she has lost much weight.  She is awaiting the fitting of a new lymphedema sleeve shortly.

## 2015-08-03 NOTE — Telephone Encounter (Signed)
ECHO already scheduled-linda replied Josem Kaufmann [phone]

## 2015-08-03 NOTE — Assessment & Plan Note (Signed)
Patient is status post right mastectomy, multiple chemotherapy regimens, and radiation treatments in the past.  She is currently undergoing research trial LCCC 1525 which consist of cytoxan and [place]. Pt received her first cycle of this trial regimen on 07/28/15.    Patient states that she has been experiencing increasing shortness of breath and fatigue/weakness for the past week or so.  She denies any recent fevers or chills.  See further notes for details.  On exam.-Patient does appear much more fatigued and frail today.  She was using a walker for ambulation and was wearing oxygen at 2 L via nasal cannula.  Patient is scheduled to return on 08/05/2015 for labs and follow-up visit.

## 2015-08-03 NOTE — Telephone Encounter (Signed)
per pof to [facility] email to pre-cert-will sch after reply-pt to follow appts as ascheduled

## 2015-08-03 NOTE — Assessment & Plan Note (Signed)
Patient is suffering with chronic hypomagnesemia.  She has been receiving intermittent magnesium infusions while here at the cancer center.  She also continues to take for magnesium tablets per day.  Magnesium today was 1.4.  He can.  Patient states that she continues to suffer with some chronic constipation; so.  Advised patient to try increasing the magnesium to 6 tablets per day.  We'll continue to monitor.  Magnesium closely.

## 2015-08-03 NOTE — Progress Notes (Signed)
TC to pt to advise thoracentesis scheduled for today at 1115 am. Pt reports she will be able to come to this appt. Notified prior auth to have procedure approved.

## 2015-08-03 NOTE — Procedures (Signed)
Ultrasound-guided diagnostic and therapeutic left thoracentesis performed yielding 0.9 liters of somewhat cloudy yellow colored fluid. No immediate complications. Follow-up chest x-ray pending.       Cyrah Mclamb E 12:38 PM 08/03/2015

## 2015-08-03 NOTE — Assessment & Plan Note (Signed)
Patient has history of chronic constipation.  She states she has been trying Colace twice daily and occasional Miralax.  She has also been taking magnesium tablets for her chronic low magnesium.  She takes , magnesium tablets per day.  Advised patient that she should increase the magnesium tablets up to 6 tablets per day due to her chronic low magnesium.  Also, advised that she may need to increase the amount of miralax  that she takes to resolve the constipation issues.

## 2015-08-03 NOTE — Assessment & Plan Note (Signed)
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  She typically uses on a 24/7 basis.  Patient states she's been doing fairly well with her respiratory status; but then developed some increased shortness of breath since this past weekend.  She states that she becomes much more short of breath when she tries to lay down.  She has been sleeping sitting up in a recliner for the past few days.  She denies any increased cough, chest pain, or chest pressure.  She feels that she cannot take a deep breath.  She denies any specific pain with inspiration.  She denies any recent fevers or chills.  Patient states that she checked her oxygen saturation while at home over the weekend and it was down to 88% on room air.  Prior to arrival to the [facility].  Patient did undergo a chest x-ray which revealed increased bilateral pleural effusions.  Exam today revealed essentially clear breath sounds bilaterally; with diminished bases.  O2 was at 2 L via nasal cannula with O2 sat 96%.  Patient was afebrile on exam.  BNP obtained today was 30.2.  CT angiogram of the chest obtained later that afternoon revealed:  IMPRESSION: No evidence of pulmonary embolism.  Interval worsening of moderate size complex right pleural effusion with associated worsening consolidation over the right midlung and right lower lobe likely atelectasis although cannot exclude concomitant infection. Stable diffuse right pleural thickening as well as stable small nodes over the neck base and mediastinum in this patient with known metastatic breast cancer. Interval worsening moderate size left pleural effusion with associated basilar atelectasis.  Stable known osseous, adrenal and liver metastases.  Sub cm hyperdense nodule over the spleen as cannot exclude underlying metastatic lesion. Recommend attention on follow-up.  Reviewed all findings with Dr. Lindi Adie on-call physician.  He advised scheduling a stat  thoracentesis to be obtained as soon as possible tomorrow, 08/03/2015.   He also recommended that patient obtain a follow-up echo as well; since patient does have a history of decreased ejection fraction.

## 2015-08-03 NOTE — Telephone Encounter (Signed)
Spoke with patient re echo for tomorrow 3/15 @ 11 am per 3/14 pof. No other orders per pof. Per CB pof was for echo to be done asap.

## 2015-08-04 ENCOUNTER — Other Ambulatory Visit: Payer: Self-pay | Admitting: *Deleted

## 2015-08-04 ENCOUNTER — Ambulatory Visit (HOSPITAL_COMMUNITY)
Admission: RE | Admit: 2015-08-04 | Discharge: 2015-08-04 | Disposition: A | Discharge status: Home / Self Care | Payer: BLUE CROSS/BLUE SHIELD | Source: Ambulatory Visit | Attending: Nurse Practitioner | Admitting: Nurse Practitioner

## 2015-08-04 DIAGNOSIS — I313 Pericardial effusion (noninflammatory): Secondary | ICD-10-CM | POA: Diagnosis not present

## 2015-08-04 DIAGNOSIS — C50919 Malignant neoplasm of unspecified site of unspecified female breast: Secondary | ICD-10-CM

## 2015-08-04 DIAGNOSIS — R221 Localized swelling, mass and lump, neck: Secondary | ICD-10-CM | POA: Diagnosis not present

## 2015-08-04 DIAGNOSIS — R0602 Shortness of breath: Secondary | ICD-10-CM | POA: Insufficient documentation

## 2015-08-04 NOTE — Progress Notes (Signed)
  Echocardiogram 2D Echocardiogram has been performed.  Nicole Valentine 08/04/2015, 12:21 PM

## 2015-08-05 ENCOUNTER — Other Ambulatory Visit (HOSPITAL_BASED_OUTPATIENT_CLINIC_OR_DEPARTMENT_OTHER): Payer: BLUE CROSS/BLUE SHIELD

## 2015-08-05 ENCOUNTER — Other Ambulatory Visit: Payer: Self-pay | Admitting: *Deleted

## 2015-08-05 ENCOUNTER — Telehealth: Payer: Self-pay | Admitting: Nurse Practitioner

## 2015-08-05 ENCOUNTER — Encounter: Payer: Self-pay | Admitting: *Deleted

## 2015-08-05 ENCOUNTER — Encounter: Payer: Self-pay | Admitting: Nurse Practitioner

## 2015-08-05 ENCOUNTER — Ambulatory Visit (HOSPITAL_BASED_OUTPATIENT_CLINIC_OR_DEPARTMENT_OTHER): Payer: BLUE CROSS/BLUE SHIELD | Admitting: Nurse Practitioner

## 2015-08-05 VITALS — BP 107/79 | HR 111 | Temp 97.4°F | Resp 18 | Ht 67.0 in | Wt 130.2 lb

## 2015-08-05 DIAGNOSIS — C50411 Malignant neoplasm of upper-outer quadrant of right female breast: Secondary | ICD-10-CM

## 2015-08-05 DIAGNOSIS — J910 Malignant pleural effusion: Secondary | ICD-10-CM | POA: Diagnosis not present

## 2015-08-05 DIAGNOSIS — Z006 Encounter for examination for normal comparison and control in clinical research program: Secondary | ICD-10-CM | POA: Diagnosis not present

## 2015-08-05 DIAGNOSIS — C50911 Malignant neoplasm of unspecified site of right female breast: Secondary | ICD-10-CM

## 2015-08-05 DIAGNOSIS — C50919 Malignant neoplasm of unspecified site of unspecified female breast: Secondary | ICD-10-CM

## 2015-08-05 DIAGNOSIS — C7951 Secondary malignant neoplasm of bone: Principal | ICD-10-CM

## 2015-08-05 LAB — MAGNESIUM: MAGNESIUM: 1.5 mg/dL (ref 1.5–2.5)

## 2015-08-05 LAB — COMPREHENSIVE METABOLIC PANEL
ANION GAP: 10 meq/L (ref 3–11)
AST: 21 U/L (ref 5–34)
Albumin: 2.8 g/dL — ABNORMAL LOW (ref 3.5–5.0)
Alkaline Phosphatase: 111 U/L (ref 40–150)
BUN: 11 mg/dL (ref 7.0–26.0)
CALCIUM: 9.4 mg/dL (ref 8.4–10.4)
CHLORIDE: 94 meq/L — AB (ref 98–109)
CO2: 36 meq/L — AB (ref 22–29)
Creatinine: 0.6 mg/dL (ref 0.6–1.1)
Glucose: 91 mg/dl (ref 70–140)
POTASSIUM: 3.8 meq/L (ref 3.5–5.1)
Sodium: 140 mEq/L (ref 136–145)
Total Bilirubin: 0.55 mg/dL (ref 0.20–1.20)
Total Protein: 6.8 g/dL (ref 6.4–8.3)

## 2015-08-05 LAB — CBC WITH DIFFERENTIAL/PLATELET
BASO%: 0.2 % (ref 0.0–2.0)
BASOS ABS: 0 10*3/uL (ref 0.0–0.1)
EOS%: 0.3 % (ref 0.0–7.0)
Eosinophils Absolute: 0 10*3/uL (ref 0.0–0.5)
HEMATOCRIT: 32 % — AB (ref 34.8–46.6)
HGB: 10.2 g/dL — ABNORMAL LOW (ref 11.6–15.9)
LYMPH#: 0.3 10*3/uL — AB (ref 0.9–3.3)
LYMPH%: 4.9 % — AB (ref 14.0–49.7)
MCH: 34.2 pg — AB (ref 25.1–34.0)
MCHC: 31.9 g/dL (ref 31.5–36.0)
MCV: 107.4 fL — ABNORMAL HIGH (ref 79.5–101.0)
MONO#: 0.6 10*3/uL (ref 0.1–0.9)
MONO%: 10.8 % (ref 0.0–14.0)
NEUT#: 4.8 10*3/uL (ref 1.5–6.5)
NEUT%: 83.8 % — AB (ref 38.4–76.8)
PLATELETS: 246 10*3/uL (ref 145–400)
RBC: 2.98 10*6/uL — AB (ref 3.70–5.45)
RDW: 20 % — ABNORMAL HIGH (ref 11.2–14.5)
WBC: 5.8 10*3/uL (ref 3.9–10.3)

## 2015-08-05 MED ORDER — LEVOFLOXACIN 500 MG PO TABS: 500.0000 mg | ORAL_TABLET | Freq: Every day | ORAL | Status: DC

## 2015-08-05 NOTE — Progress Notes (Signed)
And a Patient ID: Nicole Valentine, female   DOB: 1965-10-03, 50 y.o.   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 She had a mammogram performed that showed dense breasts bilaterally. Ultrasound of the right breast showed 2.3 x 1.9 cm area of abnormality with multiple abnormal lymph nodes. MRI of the bilateral breasts showed asymmetrical enhancement throughout the right breast consistent with multicentric disease. An ultrasound-guided biopsy performed. The known area of disease measured 1.9 x 1.9 x 2.3 cm. Multiple abnormal positive right axillary lymph nodes were noted. 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 Biopsy of the right axillary lymph node was positive for malignancy with extracapsular extension. Patient was originally seen by Dr. [name] Dr. [name] and Dr. [name]. She has elected to have a right mastectomy eventually and declined biopsies of any other areas within the breast."  She went on to receive neoadjuvant chemotherapy and attained a complete pathologic response, as documented below  METASTATIC DISEASE: From the prior summary note: Nicole Valentine noted a very small right supraclavicular mass 09/25/2014, which she brought to our attention. She was having some allergy symptoms at the time so we decided to reevaluate this after a few weeks and on 10/22/2014 as the mass persisted we obtained a restaging neck CT scan, 10/28/2014. There was bilateral supraclavicular adenopathy, but also a large right pleural effusion was  incidentally noted. We proceeded to right thoracentesis 10/29/2014, and a liter of hazy yellow fluid was removed. Cytology from this procedure ([id number]) showed malignant cells consistent with adenocarcinoma, estrogen and progesterone receptor negative, HER-2 not amplified, with a signals ratio 1.12 and the number per cell being 2.75, and with an MIB-1 of 80%. I called Ekta with these results and set her up for a PET scan performed 11/06/2014, which shows widespread metastatic disease involving particularly the right lung, liver, and bones, but also the left lung, right adrenal, and multiple lymph node areas.  Her subsequent history is as detailed below.  INTERVAL HISTORY: Nicole Valentine returns today for follow-up of her metastatic triple negative breast cancer, accompanied by research nurse, Doristine Johns, RN. Today is day 9, cycle 1 of [facility] study protocol [facility] 1525. She recieved a priming dose of cyclophosphamide last Wednesday, then returned for pembrolizumab on day 2, which will be given every 3 weeks alone from now on. The interval history is remarkable for increased shortness of breath, difficultly with deep inhalation, and generalized aches starting early Friday morning. Her pulse ox dropped down to the low 80s and she had to go back on continuous O2, which she has not needed to wear in weeks. She was seen in the symptom management clinic later that day. A chest xray and CT angio revealed increased bilateral pleural effusions, R>L. A left thoracentesis was performed for 980m of fluid positive for malignant cells. The right side was unable to be drained due to several "pockets" on this side that would make it difficult to perform. She has improved minimally since this procedure.   REVIEW OF SYSTEMS:  JLaurenis now on 1.5L O2, satting in the mid  90s. She is short of breath with minimal exertion. She has a dry cough. She denies chest pain or palpitations. She has pain between her shoulder blades, and  right thigh. Her appetite is poor. She denies nausea or vomiting. She is constipated, but moving her bowels better since advised to increase her use of magnesium to 2 tablets twice daily. She denies headaches, dizziness, or vision changes. She has no mouth sores or rashes. Her right arm is chronically swollen from lymphedema. A detailed review of system is otherwise stable.  PAST MEDICAL HISTORY: Past Medical History  Diagnosis Date  . Complication of anesthesia     her father and uncle both had very difficult time waking up-was  something they told them may be hereditory. she will find out.  [name] Hypothyroidism   . 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 Radiation 01/21/13-03/06/13    Right Breast  . Breast cancer ([place]) dx'd 04-10-12-rt    PAST SURGICAL HISTORY: Past Surgical History  Procedure Laterality Date  . Wisdom tooth extraction    . Portacath placement  04/22/2012    Procedure: INSERTION PORT-A-CATH;  Surgeon: [name], MD;  Location: [facility];  Service: General;  Laterality: Left;  [name] Modified mastectomy Right 11/18/2012    Procedure:  RIGHT MODIFIED MASTECTOMY;  Surgeon: [name], MD;  Location: [facility];  Service: General;  Laterality: Right;  . Knee arthroscopy Right 11/26/2013  . Port-a-cath removal Left 12/10/2013    Procedure: MINOR REMOVAL PORT-A-CATH;  Surgeon: [name], MD;  Location: [place];  Service: General;  Laterality: Left;    FAMILY HISTORY Family History  Problem Relation Age of Onset  . Lung cancer Maternal Grandfather 9    smoker  . Prostate cancer Maternal Grandfather 90  . Throat cancer Other     Great Aunt x 2  . Liver cancer Other     Maternal Great Grandmother  . Melanoma Maternal Uncle 81  . Brain cancer Cousin 11    non-malignant  . Allergic rhinitis Neg Hx   . Asthma Neg Hx   . Eczema Neg Hx   . Urticaria Neg Hx   the patient's parents are living, in their early 66s. The  patient has 2 brothers, no sisters. The patient's mother was diagnosed with breast cancer, HER-2 positive, in 2014 in [place].  GYNECOLOGIC HISTORY:   (Updated 10/03/2013) Menarche age 23, first live birth age 70. She is GX P4. LMP January 2014. 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  The children are currently ages 96, 67, 16, and 6. Her husband, [name], is a Customer service manager.He just started a job for [employer].  They attend the [employer] DIRECTIVES: The patient's husband is her HCPOA   HEALTH MAINTENANCE:  (Updated 10/03/2013) Social History  Substance Use Topics  . Smoking status: Never Smoker   . Smokeless tobacco: Never Used  . Alcohol Use: No     Colonoscopy: Never  PAP: November 2013/Dr. Smith  Bone density: January 2015, [facility], normal  Lipid panel:  Not on file  Allergies  Allergen Reactions  . Almond Meal Rash  . Almond Oil Rash  . Other Rash    LOTIONS-unknown type  . Tegaderm Ag Mesh [Silver] Rash  . Vancomycin Rash    Red Man Syndrome  Current Outpatient Prescriptions  Medication Sig Dispense Refill  . cholecalciferol (VITAMIN D) 1000 UNITS tablet Take 2,000 Units by mouth 2 (two) times daily.     [name] levothyroxine (SYNTHROID, LEVOTHROID) 175 MCG tablet Take 175 mcg by mouth daily before breakfast.    . Loratadine-Pseudoephedrine (CLARITIN-D 24 HOUR PO) Take 1 tablet by mouth every morning.    [name] LORazepam (ATIVAN) 0.5 MG tablet Take 1 tablet (0.5 mg total) by mouth at bedtime as needed (Nausea or vomiting). 60 tablet 2  . magnesium oxide (MAG-OX) 400 MG tablet Take 400 mg by mouth daily. Pt takes 2 capsules (800 mg total) by mouth 3 times daily.    . pantoprazole (PROTONIX) 40 MG tablet Take 1 tablet (40 mg total) by mouth daily at 12 noon. 30 tablet 2  . potassium chloride (MICRO-K) 10 MEQ CR capsule Take 2 capsules (20 mEq total) by  mouth 2 (two) times daily. 60 capsule 3  . Saline 0.2 % GEL Place 1 Squirt into the nose at bedtime.    . sodium chloride (OCEAN) 0.65 % SOLN nasal spray Place 1 spray into both nostrils QID.    [name] dextromethorphan-guaiFENesin (MUCINEX DM) 30-600 MG 12hr tablet Take 1 tablet by mouth 2 (two) times daily as needed for cough. Reported on 08/05/2015    . levofloxacin (LEVAQUIN) 500 MG tablet Take 1 tablet (500 mg total) by mouth daily. 7 tablet 0   No current facility-administered medications for this visit.      OBJECTIVE: Middle-aged white woman who appears stated age  53 Vitals:   08/05/15 1147  BP: 107/79  Pulse: 111  Temp: 97.4 F (36.3 C)  Resp: 18     Body mass index is 20.39 kg/(m^2).    ECOG FS:1 - Symptomatic but completely ambulatory Filed Weights   08/05/15 1147  Weight: 130 lb 3.2 oz (59.058 kg)   Skin: warm, dry  HEENT: sclerae anicteric, conjunctivae pink, oropharynx clear. No thrush or mucositis.  Lymph Nodes: No cervical or supraclavicular lymphadenopathy  Lungs: severely diminished lung sounds throughout the right lung field. Clear ascultation to left. No rales or wheezed noted.  Heart: regular rate and rhythm  Abdomen: round, soft, non tender, positive bowel sounds  Musculoskeletal: No focal spinal tenderness, grade 1 right upper extremity lymphedema Neuro: non focal, well oriented, positive affect  Breasts: deferred  LAB RESULTS:   Lab Results  Component Value Date   WBC 5.8 08/05/2015   NEUTROABS 4.8 08/05/2015   HGB 10.2* 08/05/2015   HCT 32.0* 08/05/2015   MCV 107.4* 08/05/2015   PLT 246 08/05/2015      Chemistry      Component Value Date/Time   NA 140 08/05/2015 1131   NA 137 04/21/2015 0546   K 3.8 08/05/2015 1131   K 3.5 04/21/2015 0546   CL 101 04/21/2015 0546   CL 101 10/18/2012 0859   CO2 36* 08/05/2015 1131   CO2 28 04/21/2015 0546   BUN 11.0 08/05/2015 1131   BUN 10 04/21/2015 0546   CREATININE 0.6 08/05/2015 1131   CREATININE  0.48 04/21/2015 0546      Component Value Date/Time   CALCIUM 9.4 08/05/2015 1131   CALCIUM 8.3* 04/21/2015 0546   ALKPHOS 111 08/05/2015 1131   ALKPHOS 94 04/21/2015 0546   AST 21 08/05/2015 1131   AST 17 04/21/2015 0546   ALT <9 08/05/2015 1131   ALT 10* 04/21/2015 0546   BILITOT 0.55 08/05/2015 1131   BILITOT 0.6 04/21/2015 0546  STUDIES: Dg Chest 1 View  08/03/2015  CLINICAL DATA:  Left-sided thoracentesis, evaluate for pneumothorax EXAM: CHEST 1 VIEW COMPARISON:  CT chest of 08/02/2015 FINDINGS: Much of the left pleural effusion has been evacuated after left thoracentesis. Only a tiny left pleural effusion remains with left basilar atelectasis. No pneumothorax is seen. A large right pleural effusion remains with considerable volume loss throughout the right lung. A left-sided Port-A-Cath is noted with the tip seen to overlie the mid SVC. Heart size is stable. Surgical clips overlie the right axilla from prior right mastectomy. IMPRESSION: 1. New pneumothorax after removal of much of the left pleural effusion by left thoracentesis. 2. No change in large right pleural effusion with considerable volume loss of the right lung. Electronically Signed   By: Ivar Drape M.D.   On: 08/03/2015 13:03   Dg Chest 2 View  08/02/2015  CLINICAL DATA:  Breast cancer, increased shortness of breath for 4 days, cough, has had thoracentesis performed several times before on RIGHT EXAM: CHEST  2 VIEW COMPARISON:  CT chest 07/14/2015 FINDINGS: LEFT jugular Port-A-Cath with tip projecting over SVC. Stable heart size and mediastinal contours. Large RIGHT and small LEFT pleural effusions, increased on LEFT and slightly increased on RIGHT. Significant atelectasis in lower lungs particularly on RIGHT. No pneumothorax. Post RIGHT mastectomy and axillary node dissection. No acute osseous lesions. IMPRESSION: Increased BILATERAL pleural effusions and basilar atelectasis since prior exam. Electronically Signed   By:  Lavonia Dana M.D.   On: 08/02/2015 12:53   Ct Chest W Contrast  07/14/2015  CLINICAL DATA:  Restaging breast cancer diagnosed in 2013 with metastatic disease to bone, liver and right lung. Chemotherapy ongoing. Recist protocol patient. EXAM: CT CHEST, ABDOMEN, AND PELVIS WITH CONTRAST TECHNIQUE: Multidetector CT imaging of the chest, abdomen and pelvis was performed following the standard protocol during bolus administration of intravenous contrast. CONTRAST:  162m OMNIPAQUE IOHEXOL 300 MG/ML  SOLN COMPARISON:  Prior CTs 04/19/2015.  PET-CT 03/17/2015. FINDINGS: RECIST 1.1 Target Lesions: 1. Right hilar node measures 8 mm on image 25 (previously 12 mm). 2. Subcarinal node measures 10 mm on image 29 (previously 10 mm). 3. Right retrocrural node measures 10 mm on image 54 (previously 13 mm). 4. Central hepatic lesion measures 2.4 cm on image 5 of series 4 (previously 2.1 cm). 5. Right adrenal lesion measures 2.2 cm on image 58 (previously 3.1 cm). Non-target Lesions: 1. Right pleural disease, subjectively improved, although difficult to measure. 2. Right lung airspace disease, subjectively improved. 3. Other hepatic metastases, mixed response. CT CHEST Mediastinum/Nodes: Previously demonstrated hypermetabolic right supraclavicular lymph node is incompletely visualized, although may be slightly smaller, measuring 6 mm on image 1. There are no enlarged axillary or internal mammary lymph nodes. There are multiple small mediastinal and hilar lymph nodes which have improved compared with the prior study. These are difficult to accurately measure given their confluent nature. A right hilar node which previously measured 12 mm now measures 8 mm on image 25. A subcarinal node which previously measured 10 mm measures 10 mm on image 29. No progressive adenopathy. The heart size is stable. There is a stable small pericardial effusion. Left IJ Port-A-Cath extends to the cavoatrial junction. Lungs/Pleura: The complex loculated  right pleural effusion has mildly decreased in volume status post interval thoracentesis. There is still diffuse right pleural thickening and enhancement, although the focal nodularity previously noted at the posterior costophrenic angle has improved. Again, this is not easily measurable given its irregular shape. A  small amount of simple dependent pleural fluid on the left appears unchanged. There has been interval improved aeration of the right middle and lower lobes with persistent volume loss, air bronchograms and septal thickening. No measurable lesion is seen within the right middle lobe. No suspicious findings in the left lung. There is a stable tiny left upper lobe nodule measuring 3 mm on image 20. There is stable dependent left lower lobe atelectasis. Musculoskeletal/Chest wall: Right mastectomy and axillary node dissection. No evidence of chest wall recurrence or suspicious osseous finding. The T9 superior endplate compression deformity is unchanged. CT ABDOMEN AND PELVIS FINDINGS Hepatobiliary: The early images through the liver were obtained prior to opacification of the hepatic veins, limiting parenchymal assessment. The individual lesions are probably best measured on the delayed images (series 5). The largest lesion centrally in the liver measures 2.4 x 1.7 cm on image number 5 (previously 2.1 x 1.4 cm). The other smaller lesions demonstrate a mixed response. For example, the previously referenced lesions appear smaller, including a right lobe lesion (9 mm on image 12, previously 15 mm) and the left lobe lesion on image number 4 (6 mm, previously 10 mm). Other lesions appear larger, including a 10 mm lesion in segment 7 on image 5. The gallbladder appears normal. There is no biliary dilatation. Pancreas: Unremarkable. No pancreatic ductal dilatation or surrounding inflammatory changes. Spleen: Normal in size without focal abnormality. Adrenals/Urinary Tract: The right adrenal lesion appears smaller,  measuring 2.2 x 1.2 cm on image 58 (previously 3.1 x 1.3 cm. The left adrenal gland appears normal. The kidneys appear normal without evidence of urinary tract calculus, suspicious lesion or hydronephrosis. No bladder abnormalities are seen. Stomach/Bowel: No evidence of bowel wall thickening, distention or surrounding inflammatory change. The pneumatosis and free air demonstrated previously have resolved. Vascular/Lymphatic: Right retrocrural node measures 10 mm on image 54 (previously 13 mm). No other enlarged abdominal or pelvic lymph nodes are seen. Stable atherosclerosis of the aorta, its branches and the iliac arteries. Reproductive: The uterus and ovaries appear unremarkable. No adnexal mass. Other: No ascites or peritoneal nodularity. Musculoskeletal: No acute or significant osseous findings. IMPRESSION: 1. Interval improvement in mediastinal lymphadenopathy and nodular pleural disease on the right. Residual disease is difficult to measure given its confluent nature. Moderate size complex right pleural effusion has mildly decreased in volume. 2. Interval improved right lung airspace disease. Remaining density may reflect combination of lymphangitic tumor, atelectasis and postobstructive pneumonitis. 3. Mixed response of hepatic metastatic disease. 4. The right adrenal metastasis is slightly smaller. 5. Interval resolution of pneumoperitoneum and pneumatosis. Electronically Signed   By: Carey Bullocks M.D.   On: 07/14/2015 15:40   Ct Angio Chest Pe W/cm &/or Wo Cm  08/02/2015  CLINICAL DATA:  Shortness of breath and fatigued with hypoxia. Upper and lower extremity swelling right greater than left. History of metastatic breast cancer. EXAM: CT ANGIOGRAPHY CHEST WITH CONTRAST TECHNIQUE: Multidetector CT imaging of the chest was performed using the standard protocol during bolus administration of intravenous contrast. Multiplanar CT image reconstructions and MIPs were obtained to evaluate the vascular  anatomy. CONTRAST:  65.40mL OMNIPAQUE IOHEXOL 350 MG/ML SOLN COMPARISON:  07/14/2015 FINDINGS: Left-sided Port-A-Cath with tip in the SVC. Exam demonstrates slightly more pronounced 9 mm sub solid nodular opacity over the right upper lobe. Slight interval worsening of a moderate size complex right pleural effusion with diffuse pleural thickening. Slight interval worsening consolidation over the right midlung and right lower lobe likely atelectatic change although cannot  exclude concomitant infection. Interval worsening of moderate size left pleural effusion with associated atelectasis in the left base. Stable subpleural 2 mm nodule over the lingula. No evidence of pulmonary embolism. Slight interval worsening of small to moderate pericardial effusion. Stable mediastinal lymph nodes not as well defined on the current exam. Stable small nodes over the neck base/ supraclavicular regions. Remainder of the mediastinal unchanged. Limited images through the upper abdomen demonstrate evidence of known liver metastases without significant change. Slight increase in size of known adrenal metastasis measuring 1.4 x 3.2 cm (previously 1.2 x 2.2 cm). Sub cm hypervascular nodule over the spleen. Stable likely pathologic mild compression fracture of T10. Stable sclerotic focus of the base of the T1 spinous process. Review of the MIP images confirms the above findings. IMPRESSION: No evidence of pulmonary embolism. Interval worsening of moderate size complex right pleural effusion with associated worsening consolidation over the right midlung and right lower lobe likely atelectasis although cannot exclude concomitant infection. Stable diffuse right pleural thickening as well as stable small nodes over the neck base and mediastinum in this patient with known metastatic breast cancer. Interval worsening moderate size left pleural effusion with associated basilar atelectasis. Stable known osseous, adrenal and liver metastases. Sub cm  hyperdense nodule over the spleen as cannot exclude underlying metastatic lesion. Recommend attention on follow-up. Electronically Signed   By: Marin Olp M.D.   On: 08/02/2015 16:11   Nm Bone Scan Whole Body  07/22/2015  CLINICAL DATA:  Breast malignancy, right groin pain, no history of trauma EXAM: NUCLEAR MEDICINE WHOLE BODY BONE SCAN TECHNIQUE: Whole body anterior and posterior images were obtained approximately 3 hours after intravenous injection of radiopharmaceutical. RADIOPHARMACEUTICALS:  26.5 mCi Technetium-45mMDP IV COMPARISON:  PET-CT study of March 17, 2015 FINDINGS: There is adequate uptake of the radiopharmaceutical by the skeleton. There is adequate soft tissue clearance and renal activity. Uptake within the calvarium is normal. Uptake within the cervical spine reveals a focus posterior likely within the spinous process of T7. There is a small focus of increased uptake in the pedicle of L1. There is increased uptake within the left aspect of the manubrium and the lowermost sternal segment. There is asymmetrically increased uptake in the humeral head on the left as compared to the right. There is mildly increased uptake in the roof of the right acetabulum and in the right femoral head. Activity more distally in the lower extremities is normal with exception of a tiny focus of uptake associated with the right knee. IMPRESSION: 1. Abnormally increased uptake within the left humeral head, the right femoral head, the roof of the right acetabulum is suspicious for metastatic disease. Plain film evaluation of these regions is recommended. 2. Activity within the spine and sternum is most likely degenerative in nature. Electronically Signed   By: David  JMartiniqueM.D.   On: 07/22/2015 13:30   Ct Abdomen Pelvis W Contrast  07/14/2015  CLINICAL DATA:  Restaging breast cancer diagnosed in 2013 with metastatic disease to bone, liver and right lung. Chemotherapy ongoing. Recist protocol patient. EXAM: CT  CHEST, ABDOMEN, AND PELVIS WITH CONTRAST TECHNIQUE: Multidetector CT imaging of the chest, abdomen and pelvis was performed following the standard protocol during bolus administration of intravenous contrast. CONTRAST:  1048mOMNIPAQUE IOHEXOL 300 MG/ML  SOLN COMPARISON:  Prior CTs 04/19/2015.  PET-CT 03/17/2015. FINDINGS: RECIST 1.1 Target Lesions: 1. Right hilar node measures 8 mm on image 25 (previously 12 mm). 2. Subcarinal node measures 10 mm on image 29 (  previously 10 mm). 3. Right retrocrural node measures 10 mm on image 54 (previously 13 mm). 4. Central hepatic lesion measures 2.4 cm on image 5 of series 4 (previously 2.1 cm). 5. Right adrenal lesion measures 2.2 cm on image 58 (previously 3.1 cm). Non-target Lesions: 1. Right pleural disease, subjectively improved, although difficult to measure. 2. Right lung airspace disease, subjectively improved. 3. Other hepatic metastases, mixed response. CT CHEST Mediastinum/Nodes: Previously demonstrated hypermetabolic right supraclavicular lymph node is incompletely visualized, although may be slightly smaller, measuring 6 mm on image 1. There are no enlarged axillary or internal mammary lymph nodes. There are multiple small mediastinal and hilar lymph nodes which have improved compared with the prior study. These are difficult to accurately measure given their confluent nature. A right hilar node which previously measured 12 mm now measures 8 mm on image 25. A subcarinal node which previously measured 10 mm measures 10 mm on image 29. No progressive adenopathy. The heart size is stable. There is a stable small pericardial effusion. Left IJ Port-A-Cath extends to the cavoatrial junction. Lungs/Pleura: The complex loculated right pleural effusion has mildly decreased in volume status post interval thoracentesis. There is still diffuse right pleural thickening and enhancement, although the focal nodularity previously noted at the posterior costophrenic angle has  improved. Again, this is not easily measurable given its irregular shape. A small amount of simple dependent pleural fluid on the left appears unchanged. There has been interval improved aeration of the right middle and lower lobes with persistent volume loss, air bronchograms and septal thickening. No measurable lesion is seen within the right middle lobe. No suspicious findings in the left lung. There is a stable tiny left upper lobe nodule measuring 3 mm on image 20. There is stable dependent left lower lobe atelectasis. Musculoskeletal/Chest wall: Right mastectomy and axillary node dissection. No evidence of chest wall recurrence or suspicious osseous finding. The T9 superior endplate compression deformity is unchanged. CT ABDOMEN AND PELVIS FINDINGS Hepatobiliary: The early images through the liver were obtained prior to opacification of the hepatic veins, limiting parenchymal assessment. The individual lesions are probably best measured on the delayed images (series 5). The largest lesion centrally in the liver measures 2.4 x 1.7 cm on image number 5 (previously 2.1 x 1.4 cm). The other smaller lesions demonstrate a mixed response. For example, the previously referenced lesions appear smaller, including a right lobe lesion (9 mm on image 12, previously 15 mm) and the left lobe lesion on image number 4 (6 mm, previously 10 mm). Other lesions appear larger, including a 10 mm lesion in segment 7 on image 5. The gallbladder appears normal. There is no biliary dilatation. Pancreas: Unremarkable. No pancreatic ductal dilatation or surrounding inflammatory changes. Spleen: Normal in size without focal abnormality. Adrenals/Urinary Tract: The right adrenal lesion appears smaller, measuring 2.2 x 1.2 cm on image 58 (previously 3.1 x 1.3 cm. The left adrenal gland appears normal. The kidneys appear normal without evidence of urinary tract calculus, suspicious lesion or hydronephrosis. No bladder abnormalities are seen.  Stomach/Bowel: No evidence of bowel wall thickening, distention or surrounding inflammatory change. The pneumatosis and free air demonstrated previously have resolved. Vascular/Lymphatic: Right retrocrural node measures 10 mm on image 54 (previously 13 mm). No other enlarged abdominal or pelvic lymph nodes are seen. Stable atherosclerosis of the aorta, its branches and the iliac arteries. Reproductive: The uterus and ovaries appear unremarkable. No adnexal mass. Other: No ascites or peritoneal nodularity. Musculoskeletal: No acute or significant osseous  findings. IMPRESSION: 1. Interval improvement in mediastinal lymphadenopathy and nodular pleural disease on the right. Residual disease is difficult to measure given its confluent nature. Moderate size complex right pleural effusion has mildly decreased in volume. 2. Interval improved right lung airspace disease. Remaining density may reflect combination of lymphangitic tumor, atelectasis and postobstructive pneumonitis. 3. Mixed response of hepatic metastatic disease. 4. The right adrenal metastasis is slightly smaller. 5. Interval resolution of pneumoperitoneum and pneumatosis. Electronically Signed   By: Carey Bullocks M.D.   On: 07/14/2015 15:40   US Thoracentesis Asp Pleural Space W/img Guide  08/03/2015  INDICATION: Breast cancer with recurrent pleural effusions. History of multiple right-sided thoracentesis. Patient presents today for thoracentesis. EXAM: ULTRASOUND GUIDED DIAGNOSTIC AND THERAPEUTIC THORACENTESIS MEDICATIONS: 1% lidocaine COMPLICATIONS: None immediate. PROCEDURE: An ultrasound guided thoracentesis was thoroughly discussed with the patient and questions answered. The benefits, risks, alternatives and complications were also discussed. The patient understands and wishes to proceed with the procedure. Written consent was obtained. Ultrasound was performed to localize and mark an adequate pocket of fluid in the left chest. Fluid was noted on  the right and left side. The right side of her chest had loculated fluid. After a discussion with Dr. Archer Asa, it was felt that a thoracentesis of the left side would be more beneficial to the patient as we would likely remove more fluid to help with her shortness of breath. The left chest was then prepped and draped in the normal sterile fashion. 1% Lidocaine was used for local anesthesia. A Safe-T-Centesis catheter was introduced. Thoracentesis was performed. The catheter was removed and a dressing applied. FINDINGS: A total of approximately 0.9 L of somewhat cloudy yellow fluid was removed. Samples were sent to the laboratory as requested by the clinical team. IMPRESSION: Successful ultrasound guided left thoracentesis yielding 0.9 L of pleural fluid. Read by: Barnetta Chapel, PA-C Electronically Signed   By: Malachy Moan M.D.   On: 08/03/2015 13:22    ASSESSMENT: 50 y.o. BRCA negative [place] woman with triple-negative stage IV breast cancer  (1) an status post right breast upper outer quadrant and right axillary lymph node biopsy 04/04/2012, both positive for an invasive ductal carcinoma, grade 3, triple negative, with an MIB-1 of 25%  (2) Treated neoadjuvantly with  (a) fluorouracil, cyclophosphamide, and epirubicin (at 100 mg/M2) x4 completed 06/07/2012  (b) docetaxel (75 mg/M2) for one dose, 06/21/2012, poorly tolerated  (c) carboplatin and gemcitabine given every 21 days for 6 cycles completed 10/11/2012  (3) status post right modified radical mastectomy 11/18/2012 showing a complete pathologic response--all 16 lymph nodes were benign  (a) no plans for reconstructon  (4) adjuvant radiation therapy completed 03/06/2013  METASTATIC DISEASE June 2016 (5) 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, 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  (6) staging PET scan 11/06/2014 showed significant disease in the middle and lower lobes of the Right lung, Right effusion,  multiple liver and bone lesions, as well as left lung nodules, a Right adrenal nodule, and widespread hypermetabolic adenopathy  (a) brain MRI 11/16/2014 showed multiple cerebellar lesions  (7) RADIATION TREATMENTS thoracic metastases  (a) 01/28/2015-02/11/2015: Thoracic Spine T8-10, 30 Gy in 10 fractions  (7) abraxane day 1 and day 8 of each 21 day cycle started 11/09/2014: last dose 03/12/2015 (6 cycles)  (a) PET scan 01/05/2015 shows an excellent initial response after 3 cycles  (b) PET scan 03/17/2015 shows progression  (8) zolendronate started 11/16/2014, to be repeated every 12 weeks, most recent dose 02/09/2015  (9)  Foundation 1 study sent  11/06/2014: patient's sister in law Garvin Fila following [phone])  (a) 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[place], NTRK1, MYC, ARID1A and LYN, none w approved therapies  (b) pazopanib, ponatinib or crizotinib suggested as possible off-protocol options  (10) RADIATION TREATMENTS: Brain metastases:  (a) s/p SRS therapy 12/11/2014 to 4 right cerebellar lesions  (b) multiple (18+) lesions noted on repeat brain MRI 03/16/2015  (c) whole brain irradiation completed 04/12/2015  (11) uncontrolled pain: Started on OxyContin 10 mg twice a day with oxycodone 5 mg as needed 03/26/2015  (12) Right pleural effusion  (a) s/p thoracentesis July 2016 (x2), 03/30/2015  (13) started eribulin11/29/2016, originally planned to be given day one and day 8 of each 21 day cycle, but switched to every 14 days with onpro support because of persistent leukopenia. Discontinued 05/25/14, after 2 cycles, w no evidence of response  (14) cisplatin started 05/28/2015, to be repeated every 21 days. Last dose given 07/08/15. 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   (15) [facility] study protocol L[facility] 1771 started 07/28/15. Consists of pembrolizumab every 21 days. Cyclophosphamide given as a "primer" on day 1, cycle 1 only.   PLAN: Bottom line, Nicole Valentine is still feeling poorly despite a left  thoracentesis because the right pleural effusion (which was larger) is causing considerable volume loss of the right lung. Unfortunately this could not be drained due to lack of a good access per interventional radiology. I don't think that the increase in her pleural effusions was caused by the 1 dose of cyclophosphamide and pembrolizumab. They were present to a smaller extent prior to the start of these drugs. It has been 30 days since her last cisplatin infusion which was controlling her cancer previously but discontinued due to bone marrow failure. The CT/angio of the chest also showed worsening consolidation of the right lung, for which an concomitant infection could not be ruled out. With lack of better options, I think it would be a good idea to treat her with levaquin for now to eliminate the threat of pneumonia.  The case was reviewed with Dr. Lindi Adie and he suggested we get cardiothoracic surgery involved. They may be able to place a more durable catheter such as a pleurx to the right side for easier draining of the pleural effusion. I called Dr. Leonarda Salon office and they will arrange for a consultation visit early next week. For now she will keep the arrangements with interventional radiology for a thoracentesis on 3/20 to see if the landscape has changed enough that one might be able to be performed at that time  Nicole Valentine had planned to travel via train to [place] from 3/23-3/27 to watch her sons sing at [facility], which is a 10 hour train ride. I am not sure she will be strong enough to do that. We have encouraged to her to look into a flight which would cut down on travel time dramatically. We are looking into funding for this as well. In any case, I am scheduling a follow up visit with Dr. [name] the day before this trip to ensure that she is stable. She knows to proceed straight to the ED with any sudden or severe breathing issues. She understands and agrees with this plan. She has been  encouraged to call with any issues that might arise before her next visit here.  Laurie Panda, NP  08/05/2015

## 2015-08-05 NOTE — Progress Notes (Signed)
08/05/15 at 2:52pm - [place] 1525 -follow up visit for cycle 1- The pt was into the cancer center today for continued follow up after her cycle 1 treatment administered last week. The pt was seen and examined today by Gentry Fitz, Dr. Virgie Dad NP.  The pt's labs were reviewed by the NP, and her abnormal lab values were not clinically significant.  The pt's magnesium is WNL.  The pt is still using her oxygen at 1.5 liters.  The pt said that if she removes the oxygen and tries to do any housework, then she becomes short of breath.  The pt denies any chest pain.  The pt's echo revealed an EF of 55% which is an improvement from baseline.  The pt states that she feels "like she has the flu".  The feels tired with some achiness in her shoulders.  The NP felt that the pt may need an antibiotic since her latest CT mentioned "worsening consolidation".  The pt was prescribed Levaquin for 5 days.  The pt will have her right pleural effusion drained on Monday.  Her left pleural effusion was drained on 08/03/15.  The pt is planning a trip to [place] to see her sons perform at [facility].  The pt is planning to travel by train.  The NP and research nurse advised the pt that a 10 hour train trip may be difficult due to her current status.  The pt later said that she would discuss with her husband about taking a plane.  The pt's current concomitant medications were reviewed with the pt.  The pt will return to see Dr. [name] on Wednesday before leaving on her trip.  The NP stated that the pt's current dyspnea as a result of her pleural effusions is disease related not treatment related.   Brion Aliment RN, BSN, CCRP  Clinical Research Nurse 08/05/2015 4:11 PM   08/10/15 at 2:06pm - The research nurse received a call from [facility], Dr. Virgie Dad NP.  She said that the pt wants to be seen today by a provider.  The pt was scheduled to be seen by Dr. [name] tomorrow.  The pt reported "extreme fatigue" as her main  compliant along with neck/shoulder pain.  The pt also wants to discuss her trip to [place] that was scheduled for Thursday.   The pt had a thorancentesis yesterday.  The pt was also evaluated by Dr. Servando Snare, [place].   Brion Aliment RN, BSN, CCRP  Clinical Research Nurse 08/10/2015 2:09 PM   08/10/15 at 3:25pm - The pt was seen by Gentry Fitz, NP, this afternoon.  The pt was in a wheelchair.  The pt said that she cannot walk too far because she feels "wobbly on her feet". The pt said that she is drinking okay.  She said that her shoulders/neck hurt but she feels that it is related to her sleeping in a chair.  The pt was advised to take her short-acting oxycodone for relief.  The pt has decided to not fly to [place] because the CVTS doctor was concerned that her recent thorancentesis may increase her risk for developing "air pockets" if she is in an airplane.  The pt has decided to ride to [place] instead to see her sons perform at [facility].  The pt was advised to stop taking her Levaquin.  The NP said that it is probably not helping.  The pt does complain of severe fatigue-grade 3.  The research nurse will alert the [facility]  study team about her fatigue.  The pt is scheduled to have a Pleurix placed next week.  The pt is scheduled to see Dr. [name] tomorrow.  The pt said that her and her family is very concerned about her continuing on this trial.  The research nurse informed the pt that she can withdraw at anytime.  The pt will discuss this tomorrow with Dr. [name]. Brion Aliment RN, BSN, CCRP Clinical Research Nurse 08/10/2015 3:31 PM    08/11/15 at 2:45pm - The pt was into the cancer center this morning to see Dr. [name].  The pt denies any new adverse events since her last visit.  The pt remains in a wheelchair with oxygen.  The was seen and examined by Dr. [name].  Dr. Wetzel Bjornstad, the medical monitor, was informed of the pt's status.  Dr. Ula Lingo communicated with Dr. [name] by email about the pt's status.   She stated that the "effect of immunotherapy can by delayed".  The pt is still going to [place] by car to see her 2 sons sing at [employer].  Dr. [name] stated that he does not feel that her pleural effusions and dyspnea is related to her cycle 1 treatment.  He said that he needed to determine what is causing her "severe fatigue".  Dr. [name] wanted to rule out adrenal insufficiency.  Therefore, he ordered a cortisol level and some additional labs today.  The pt will be re-evaluated when she returns home from [place] next week.  Currently, the pt's cycle 2 is scheduled for 08/19/15.  Dr. [name] is hopeful that the pt will proceed with her cycle 2 treatment.  The pt will receive her IV magnesium today for her chronically low magnesium level.  Dr. [name] said that her elevated TSH level is "not clinically significant".  The pt is also scheduled for a Pleurix catheter next week.   Brion Aliment RN, BSN, CCRP Clinical Research Nurse 08/11/2015 2:55 PM

## 2015-08-05 NOTE — Telephone Encounter (Signed)
appt made and avs printed °

## 2015-08-06 LAB — PHOSPHORUS: Phosphorus, Ser: 3.7 mg/dL (ref 2.5–4.5)

## 2015-08-09 ENCOUNTER — Other Ambulatory Visit: Payer: Self-pay | Admitting: *Deleted

## 2015-08-09 ENCOUNTER — Institutional Professional Consult (permissible substitution) (INDEPENDENT_AMBULATORY_CARE_PROVIDER_SITE_OTHER): Payer: BLUE CROSS/BLUE SHIELD | Admitting: Cardiothoracic Surgery

## 2015-08-09 ENCOUNTER — Encounter: Payer: Self-pay | Admitting: Cardiothoracic Surgery

## 2015-08-09 ENCOUNTER — Ambulatory Visit (HOSPITAL_COMMUNITY)
Admission: RE | Admit: 2015-08-09 | Discharge: 2015-08-09 | Disposition: A | Discharge status: Home / Self Care | Payer: BLUE CROSS/BLUE SHIELD | Source: Ambulatory Visit | Attending: Nurse Practitioner | Admitting: Nurse Practitioner

## 2015-08-09 ENCOUNTER — Ambulatory Visit (HOSPITAL_COMMUNITY)
Admission: RE | Admit: 2015-08-09 | Discharge: 2015-08-09 | Disposition: A | Discharge status: Home / Self Care | Payer: BLUE CROSS/BLUE SHIELD | Source: Ambulatory Visit | Attending: Radiology | Admitting: Radiology

## 2015-08-09 VITALS — BP 125/81 | HR 112 | Resp 16 | Ht 67.0 in | Wt 130.0 lb

## 2015-08-09 DIAGNOSIS — J910 Malignant pleural effusion: Secondary | ICD-10-CM | POA: Insufficient documentation

## 2015-08-09 DIAGNOSIS — Z9889 Other specified postprocedural states: Secondary | ICD-10-CM | POA: Diagnosis present

## 2015-08-09 DIAGNOSIS — R918 Other nonspecific abnormal finding of lung field: Secondary | ICD-10-CM | POA: Diagnosis not present

## 2015-08-09 DIAGNOSIS — J90 Pleural effusion, not elsewhere classified: Secondary | ICD-10-CM

## 2015-08-09 NOTE — Progress Notes (Signed)
[address]       [place] [address]             [phone]                    Nicole Valentine [facility] Medical Record [id number] Date of Birth: 04-Aug-1965  Referring: Laurie Panda, NP Primary Care: [name], MD  Chief Complaint:    Chief Complaint  Patient presents with  . Malignant Pleural Effusion    LEFT...thoracentesis x 4, last time today....she wants to travel to [place] 08/12/15 for personal reasons.    History of Present Illness:    Nicole Valentine 50 y.o. 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 Patient is now chronically ill on home oxygen full-time. She had thoracentesis last week on the left, and drainage of a small amount of pleural fluid from the right yesterday.      Current Activity/ Functional Status:  Patient is independent with mobility/ambulation, transfers, ADL's, IADL's.   Zubrod Score: At the time of surgery this patient's most appropriate activity status/level should be described as: []     0    Normal activity, no symptoms []     1    Restricted in physical strenuous activity but ambulatory, able to do out light work [x]     2    Ambulatory and capable of self care, unable to do work activities, up and about               >50 % of waking hours                              []     3    Only limited self care, in bed greater than 50% of waking hours []     4    Completely disabled, no self care, confined to bed or chair []     5    Moribund   Past Medical History  Diagnosis Date  . Complication of anesthesia     her father and uncle both had very difficult time waking up-was  something they told them may be hereditory. she will find out.  [name] Hypothyroidism   . 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 Radiation 01/21/13-03/06/13    Right Breast  . Breast cancer ([facility]) dx'd 04-10-12-rt     Past Surgical History  Procedure Laterality Date  . Wisdom tooth extraction    . Portacath placement  04/22/2012    Procedure: INSERTION PORT-A-CATH;  Surgeon: [name], MD;  Location: [facility];  Service: General;  Laterality: Left;  [name] Modified mastectomy Right 11/18/2012    Procedure:  RIGHT MODIFIED MASTECTOMY;  Surgeon: [name], MD;  Location: [facility];  Service: General;  Laterality: Right;  . Knee arthroscopy Right 11/26/2013  . Port-a-cath removal Left 12/10/2013    Procedure: MINOR REMOVAL PORT-A-CATH;  Surgeon: [name], MD;  Location: [facility];  Service: General;  Laterality: Left;    Family History  Problem Relation Age of Onset  . Lung cancer Maternal Grandfather 27    smoker  . Prostate cancer Maternal Grandfather 90  . Throat cancer Other     Great Aunt x 2  . Liver cancer Other  Maternal Event organiser  . Melanoma Maternal Uncle 81  . Brain cancer Cousin 11    non-malignant  . Allergic rhinitis Neg Hx   . Asthma Neg Hx   . Eczema Neg Hx   . Urticaria Neg Hx     Social History   Social History  . Marital Status: Married    Spouse Name: N/A  . Number of Children: N/A  . Years of Education: N/A   Occupational History  . Not on file.   Social History Main Topics  . Smoking status: Never Smoker   . Smokeless tobacco: Never Used  . Alcohol Use: No  . Drug Use: No  . Sexual Activity: Yes   Other Topics Concern  . Not on file   Social History Narrative    History  Smoking status  . Never Smoker   Smokeless tobacco  . Never Used    History  Alcohol Use No     Allergies  Allergen Reactions  . Almond Meal Rash  . Almond Oil Rash  . Other Rash    LOTIONS-unknown type  . Tegaderm Ag Mesh [Silver] Rash  . Vancomycin Rash    Red Man Syndrome    Current Outpatient Prescriptions  Medication Sig Dispense Refill  . cholecalciferol (VITAMIN D) 1000 UNITS  tablet Take 2,000 Units by mouth 2 (two) times daily.     [name] dextromethorphan-guaiFENesin (MUCINEX DM) 30-600 MG 12hr tablet Take 1 tablet by mouth 2 (two) times daily as needed for cough. Reported on 08/05/2015    . levofloxacin (LEVAQUIN) 500 MG tablet Take 1 tablet (500 mg total) by mouth daily. 7 tablet 0  . levothyroxine (SYNTHROID, LEVOTHROID) 175 MCG tablet Take 175 mcg by mouth daily before breakfast.    . Loratadine-Pseudoephedrine (CLARITIN-D 24 HOUR PO) Take 1 tablet by mouth every morning.    [name] LORazepam (ATIVAN) 0.5 MG tablet Take 1 tablet (0.5 mg total) by mouth at bedtime as needed (Nausea or vomiting). 60 tablet 2  . magnesium oxide (MAG-OX) 400 MG tablet Take 600 mg by mouth daily. Pt takes 2 capsules  by mouth 3 times daily.    . pantoprazole (PROTONIX) 40 MG tablet Take 1 tablet (40 mg total) by mouth daily at 12 noon. 30 tablet 2  . Saline 0.2 % GEL Place 1 Squirt into the nose at bedtime.    . sodium chloride (OCEAN) 0.65 % SOLN nasal spray Place 1 spray into both nostrils QID.     No current facility-administered medications for this visit.      Review of Systems:     Cardiac Review of Systems: Y or N  Chest Pain [   y ]  Resting SOB [ y  ] Exertional SOB  [ y ]  Orthopnea Blue.Reese  ]   Pedal Edema [n   ]    Palpitations Florencio.Farrier ] Syncope  [ n ]   Presyncope [ n ]  General Review of Systems: [Y] = yes [  ]=no Constitional: recent weight change [ y ];  Wt loss over the last 3 months [   ] anorexia [  ]; fatigue Blue.Reese  ]; nausea [  ]; night sweats [  ]; fever [  ]; or chills [  ];          Dental: poor dentition[  ]; Last Dentist visit:   Eye : blurred vision [  ]; diplopia [   ]; vision changes [  ];  Amaurosis fugax[  ];  Resp: cough [  ];  wheezing[  ];  hemoptysis[  ]; shortness of breath[  ]; paroxysmal nocturnal dyspnea[  ]; dyspnea on exertion[  ]; or orthopnea[  ];  GI:  gallstones[  ], vomiting[  ];  dysphagia[  ]; melena[  ];  hematochezia [  ]; heartburn[  ];   Hx of   Colonoscopy[  ]; GU: kidney stones [  ]; hematuria[  ];   dysuria [  ];  nocturia[  ];  history of     obstruction [  ]; urinary frequency [  ]             Skin: rash, swelling[  ];, hair loss[  ];  peripheral edema[  ];  or itching[  ]; Musculosketetal: myalgias[  ];  joint swelling[  ];  joint erythema[  ];  joint pain[  ];  back pain[  ];  Heme/Lymph: bruising[  ];  bleeding[  ];  anemia[  ];  Neuro: TIA[  ];  headaches[  ];  stroke[  ];  vertigo[  ];  seizures[  ];   paresthesias[  ];  difficulty walking[  ];  Psych:depression[  ]; anxiety[  ];  Endocrine: diabetes[  ];  thyroid dysfunction[  ];  Immunizations: Flu up to date [  ]; Pneumococcal up to date [  ];  Other:  Physical Exam: BP 125/81 mmHg  Pulse 112  Resp 16  Ht 5\' 7"  (1.702 m)  Wt 130 lb (58.968 kg)  BMI 20.36 kg/m2  SpO2 98%  LMP 03/29/2012  PHYSICAL EXAMINATION: General appearance: alert, cooperative, appears older than stated age, cachectic and pale Head: Normocephalic, without obvious abnormality, atraumatic, wig in place Neck: no adenopathy, no carotid bruit, no JVD, supple, symmetrical, trachea midline and thyroid not enlarged, symmetric, no tenderness/mass/nodules Lymph nodes: Cervical, supraclavicular, and axillary nodes normal. Resp: diminished breath sounds RLL, RML and RUL Back: symmetric, no curvature. ROM normal. No CVA tenderness. GI: soft, non-tender; bowel sounds normal; no masses,  no organomegaly Extremities: extremities normal, atraumatic, no cyanosis or edema and Homans sign is negative, no sign of DVT Neurologic: Grossly normal Lymphedema right arm   Diagnostic Studies & Laboratory data:     Recent Radiology Findings:     Ct Angio Chest Pe W/cm &/or Wo Cm  08/02/2015  CLINICAL DATA:  Shortness of breath and fatigued with hypoxia. Upper and lower extremity swelling right greater than left. History of metastatic breast cancer. EXAM: CT ANGIOGRAPHY CHEST WITH CONTRAST TECHNIQUE: Multidetector  CT imaging of the chest was performed using the standard protocol during bolus administration of intravenous contrast. Multiplanar CT image reconstructions and MIPs were obtained to evaluate the vascular anatomy. CONTRAST:  65.32mL OMNIPAQUE IOHEXOL 350 MG/ML SOLN COMPARISON:  07/14/2015 FINDINGS: Left-sided Port-A-Cath with tip in the SVC. Exam demonstrates slightly more pronounced 9 mm sub solid nodular opacity over the right upper lobe. Slight interval worsening of a moderate size complex right pleural effusion with diffuse pleural thickening. Slight interval worsening consolidation over the right midlung and right lower lobe likely atelectatic change although cannot exclude concomitant infection. Interval worsening of moderate size left pleural effusion with associated atelectasis in the left base. Stable subpleural 2 mm nodule over the lingula. No evidence of pulmonary embolism. Slight interval worsening of small to moderate pericardial effusion. Stable mediastinal lymph nodes not as well defined on the current exam. Stable small nodes over the neck base/ supraclavicular regions. Remainder of the mediastinal unchanged. Limited images through the upper  abdomen demonstrate evidence of known liver metastases without significant change. Slight increase in size of known adrenal metastasis measuring 1.4 x 3.2 cm (previously 1.2 x 2.2 cm). Sub cm hypervascular nodule over the spleen. Stable likely pathologic mild compression fracture of T10. Stable sclerotic focus of the base of the T1 spinous process. Review of the MIP images confirms the above findings. IMPRESSION: No evidence of pulmonary embolism. Interval worsening of moderate size complex right pleural effusion with associated worsening consolidation over the right midlung and right lower lobe likely atelectasis although cannot exclude concomitant infection. Stable diffuse right pleural thickening as well as stable small nodes over the neck base and mediastinum  in this patient with known metastatic breast cancer. Interval worsening moderate size left pleural effusion with associated basilar atelectasis. Stable known osseous, adrenal and liver metastases. Sub cm hyperdense nodule over the spleen as cannot exclude underlying metastatic lesion. Recommend attention on follow-up. Electronically Signed   By: Marin Olp M.D.   On: 08/02/2015 16:11   Nm Bone Scan Whole Body  07/22/2015  CLINICAL DATA:  Breast malignancy, right groin pain, no history of trauma EXAM: NUCLEAR MEDICINE WHOLE BODY BONE SCAN TECHNIQUE: Whole body anterior and posterior images were obtained approximately 3 hours after intravenous injection of radiopharmaceutical. RADIOPHARMACEUTICALS:  26.5 mCi Technetium-22m MDP IV COMPARISON:  PET-CT study of March 17, 2015 FINDINGS: There is adequate uptake of the radiopharmaceutical by the skeleton. There is adequate soft tissue clearance and renal activity. Uptake within the calvarium is normal. Uptake within the cervical spine reveals a focus posterior likely within the spinous process of T7. There is a small focus of increased uptake in the pedicle of L1. There is increased uptake within the left aspect of the manubrium and the lowermost sternal segment. There is asymmetrically increased uptake in the humeral head on the left as compared to the right. There is mildly increased uptake in the roof of the right acetabulum and in the right femoral head. Activity more distally in the lower extremities is normal with exception of a tiny focus of uptake associated with the right knee. IMPRESSION: 1. Abnormally increased uptake within the left humeral head, the right femoral head, the roof of the right acetabulum is suspicious for metastatic disease. Plain film evaluation of these regions is recommended. 2. Activity within the spine and sternum is most likely degenerative in nature. Electronically Signed   By: David  Martinique M.D.   On: 07/22/2015 13:30   Ct  Abdomen Pelvis W Contrast  07/14/2015  CLINICAL DATA:  Restaging breast cancer diagnosed in 2013 with metastatic disease to bone, liver and right lung. Chemotherapy ongoing. Recist protocol patient. EXAM: CT CHEST, ABDOMEN, AND PELVIS WITH CONTRAST TECHNIQUE: Multidetector CT imaging of the chest, abdomen and pelvis was performed following the standard protocol during bolus administration of intravenous contrast. CONTRAST:  167mL OMNIPAQUE IOHEXOL 300 MG/ML  SOLN COMPARISON:  Prior CTs 04/19/2015.  PET-CT 03/17/2015. FINDINGS: RECIST 1.1 Target Lesions: 1. Right hilar node measures 8 mm on image 25 (previously 12 mm). 2. Subcarinal node measures 10 mm on image 29 (previously 10 mm). 3. Right retrocrural node measures 10 mm on image 54 (previously 13 mm). 4. Central hepatic lesion measures 2.4 cm on image 5 of series 4 (previously 2.1 cm). 5. Right adrenal lesion measures 2.2 cm on image 58 (previously 3.1 cm). Non-target Lesions: 1. Right pleural disease, subjectively improved, although difficult to measure. 2. Right lung airspace disease, subjectively improved. 3. Other hepatic metastases, mixed response. CT  CHEST Mediastinum/Nodes: Previously demonstrated hypermetabolic right supraclavicular lymph node is incompletely visualized, although may be slightly smaller, measuring 6 mm on image 1. There are no enlarged axillary or internal mammary lymph nodes. There are multiple small mediastinal and hilar lymph nodes which have improved compared with the prior study. These are difficult to accurately measure given their confluent nature. A right hilar node which previously measured 12 mm now measures 8 mm on image 25. A subcarinal node which previously measured 10 mm measures 10 mm on image 29. No progressive adenopathy. The heart size is stable. There is a stable small pericardial effusion. Left IJ Port-A-Cath extends to the cavoatrial junction. Lungs/Pleura: The complex loculated right pleural effusion has mildly  decreased in volume status post interval thoracentesis. There is still diffuse right pleural thickening and enhancement, although the focal nodularity previously noted at the posterior costophrenic angle has improved. Again, this is not easily measurable given its irregular shape. A small amount of simple dependent pleural fluid on the left appears unchanged. There has been interval improved aeration of the right middle and lower lobes with persistent volume loss, air bronchograms and septal thickening. No measurable lesion is seen within the right middle lobe. No suspicious findings in the left lung. There is a stable tiny left upper lobe nodule measuring 3 mm on image 20. There is stable dependent left lower lobe atelectasis. Musculoskeletal/Chest wall: Right mastectomy and axillary node dissection. No evidence of chest wall recurrence or suspicious osseous finding. The T9 superior endplate compression deformity is unchanged. CT ABDOMEN AND PELVIS FINDINGS Hepatobiliary: The early images through the liver were obtained prior to opacification of the hepatic veins, limiting parenchymal assessment. The individual lesions are probably best measured on the delayed images (series 5). The largest lesion centrally in the liver measures 2.4 x 1.7 cm on image number 5 (previously 2.1 x 1.4 cm). The other smaller lesions demonstrate a mixed response. For example, the previously referenced lesions appear smaller, including a right lobe lesion (9 mm on image 12, previously 15 mm) and the left lobe lesion on image number 4 (6 mm, previously 10 mm). Other lesions appear larger, including a 10 mm lesion in segment 7 on image 5. The gallbladder appears normal. There is no biliary dilatation. Pancreas: Unremarkable. No pancreatic ductal dilatation or surrounding inflammatory changes. Spleen: Normal in size without focal abnormality. Adrenals/Urinary Tract: The right adrenal lesion appears smaller, measuring 2.2 x 1.2 cm on image 58  (previously 3.1 x 1.3 cm. The left adrenal gland appears normal. The kidneys appear normal without evidence of urinary tract calculus, suspicious lesion or hydronephrosis. No bladder abnormalities are seen. Stomach/Bowel: No evidence of bowel wall thickening, distention or surrounding inflammatory change. The pneumatosis and free air demonstrated previously have resolved. Vascular/Lymphatic: Right retrocrural node measures 10 mm on image 54 (previously 13 mm). No other enlarged abdominal or pelvic lymph nodes are seen. Stable atherosclerosis of the aorta, its branches and the iliac arteries. Reproductive: The uterus and ovaries appear unremarkable. No adnexal mass. Other: No ascites or peritoneal nodularity. Musculoskeletal: No acute or significant osseous findings. IMPRESSION: 1. Interval improvement in mediastinal lymphadenopathy and nodular pleural disease on the right. Residual disease is difficult to measure given its confluent nature. Moderate size complex right pleural effusion has mildly decreased in volume. 2. Interval improved right lung airspace disease. Remaining density may reflect combination of lymphangitic tumor, atelectasis and postobstructive pneumonitis. 3. Mixed response of hepatic metastatic disease. 4. The right adrenal metastasis is slightly smaller. 5.  Interval resolution of pneumoperitoneum and pneumatosis. Electronically Signed   By: Richardean Sale M.D.   On: 07/14/2015 15:40   US Thoracentesis Asp Pleural Space W/img Guide  08/09/2015  INDICATION: Breast cancer, dyspnea, malignant right pleural effusion. Request made for therapeutic right thoracentesis. EXAM: ULTRASOUND GUIDED THERAPEUTIC RIGHT THORACENTESIS MEDICATIONS: None. COMPLICATIONS: None immediate. PROCEDURE: An ultrasound guided thoracentesis was thoroughly discussed with the patient and questions answered. The benefits, risks, alternatives and complications were also discussed. The patient understands and wishes to proceed  with the procedure. Written consent was obtained. Ultrasound was performed to localize and mark an adequate pocket of fluid in the right chest. The area was then prepped and draped in the normal sterile fashion. 1% Lidocaine was used for local anesthesia. Under ultrasound guidance a Safe-T-Centesis catheter was introduced. Thoracentesis was performed. The catheter was removed and a dressing applied. FINDINGS: A total of approximately 500 cc of amber fluid was removed. The pleural fluid collection was multi-septated. Only the above amount of fluid was removed at this time secondary to persistent patient coughing and chest discomfort. IMPRESSION: Successful ultrasound guided therapeutic right thoracentesis yielding 500 cc of pleural fluid. Read by: Rowe Robert, PA-C Electronically Signed   By: Corrie Mckusick D.O.   On: 08/09/2015 12:21   US Thoracentesis Asp Pleural Space W/img Guide  08/03/2015  INDICATION: Breast cancer with recurrent pleural effusions. History of multiple right-sided thoracentesis. Patient presents today for thoracentesis. EXAM: ULTRASOUND GUIDED DIAGNOSTIC AND THERAPEUTIC THORACENTESIS MEDICATIONS: 1% lidocaine COMPLICATIONS: None immediate. PROCEDURE: An ultrasound guided thoracentesis was thoroughly discussed with the patient and questions answered. The benefits, risks, alternatives and complications were also discussed. The patient understands and wishes to proceed with the procedure. Written consent was obtained. Ultrasound was performed to localize and mark an adequate pocket of fluid in the left chest. Fluid was noted on the right and left side. The right side of her chest had loculated fluid. After a discussion with Dr. Laurence Ferrari, it was felt that a thoracentesis of the left side would be more beneficial to the patient as we would likely remove more fluid to help with her shortness of breath. The left chest was then prepped and draped in the normal sterile fashion. 1% Lidocaine was used  for local anesthesia. A Safe-T-Centesis catheter was introduced. Thoracentesis was performed. The catheter was removed and a dressing applied. FINDINGS: A total of approximately 0.9 L of somewhat cloudy yellow fluid was removed. Samples were sent to the laboratory as requested by the clinical team. IMPRESSION: Successful ultrasound guided left thoracentesis yielding 0.9 L of pleural fluid. Read by: Saverio Danker, PA-C Electronically Signed   By: Jacqulynn Cadet M.D.   On: 08/03/2015 13:22     I have independently reviewed the above radiologic studies.  Recent Lab Findings: Lab Results  Component Value Date   WBC 5.8 08/05/2015   HGB 10.2* 08/05/2015   HCT 32.0* 08/05/2015   PLT 246 08/05/2015   GLUCOSE 91 08/05/2015   ALT <9 08/05/2015   AST 21 08/05/2015   NA 140 08/05/2015   K 3.8 08/05/2015   CL 101 04/21/2015   CREATININE 0.6 08/05/2015   BUN 11.0 08/05/2015   CO2 36* 08/05/2015   TSH 2.950 07/15/2015   INR 1.28 04/19/2015      Assessment / Plan:   Stage IV triple negative breast cancer with recurrent bilateral pleural effusions right greater than left. On CT scan the patient has significant pleural thickening on the right compared to the left  and a much larger right pleural effusion. Discussed with the patient and her husband use of palliative therapy with Pleurx catheter to keep the pleural effusion drained to improve her overall breathing ability and shortness of breath with exertion. I offered to perform this on March 21. 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 She would like to wait until after her return to consider placement of Pleurx catheter.       I  spent 30 minutes counseling the patient face to face and 50% or more the  time was spent in counseling and coordination of care. The total time spent in the appointment was 40 minutes.  [name] MD      [address] [place]  [address] Office [phone]   Beeper [phone]  08/09/2015 5:52 PM

## 2015-08-09 NOTE — Procedures (Addendum)
Ultrasound-guided therapeutic right thoracentesis performed yielding 500 cc of amber colored fluid. No immediate complications. Follow-up chest x-ray pending. Only the above amount of fluid could be aspirated today secondary to persistent coughing and chest discomfort. The collection was multiseptated.

## 2015-08-10 ENCOUNTER — Other Ambulatory Visit: Payer: Self-pay

## 2015-08-10 ENCOUNTER — Telehealth: Payer: Self-pay | Admitting: Oncology

## 2015-08-10 ENCOUNTER — Ambulatory Visit (HOSPITAL_BASED_OUTPATIENT_CLINIC_OR_DEPARTMENT_OTHER): Payer: BLUE CROSS/BLUE SHIELD

## 2015-08-10 ENCOUNTER — Ambulatory Visit (HOSPITAL_BASED_OUTPATIENT_CLINIC_OR_DEPARTMENT_OTHER): Payer: BLUE CROSS/BLUE SHIELD | Admitting: Nurse Practitioner

## 2015-08-10 ENCOUNTER — Telehealth: Payer: Self-pay

## 2015-08-10 ENCOUNTER — Encounter: Payer: Self-pay | Admitting: Nurse Practitioner

## 2015-08-10 ENCOUNTER — Other Ambulatory Visit: Payer: Self-pay | Admitting: Nurse Practitioner

## 2015-08-10 VITALS — BP 110/66 | HR 116 | Temp 97.7°F | Resp 18 | Wt 129.2 lb

## 2015-08-10 DIAGNOSIS — C50911 Malignant neoplasm of unspecified site of right female breast: Secondary | ICD-10-CM

## 2015-08-10 DIAGNOSIS — C7802 Secondary malignant neoplasm of left lung: Secondary | ICD-10-CM

## 2015-08-10 DIAGNOSIS — J910 Malignant pleural effusion: Secondary | ICD-10-CM

## 2015-08-10 DIAGNOSIS — R5383 Other fatigue: Secondary | ICD-10-CM

## 2015-08-10 DIAGNOSIS — C7951 Secondary malignant neoplasm of bone: Secondary | ICD-10-CM

## 2015-08-10 DIAGNOSIS — D649 Anemia, unspecified: Secondary | ICD-10-CM

## 2015-08-10 DIAGNOSIS — C7971 Secondary malignant neoplasm of right adrenal gland: Secondary | ICD-10-CM

## 2015-08-10 DIAGNOSIS — C50411 Malignant neoplasm of upper-outer quadrant of right female breast: Secondary | ICD-10-CM | POA: Diagnosis not present

## 2015-08-10 DIAGNOSIS — C778 Secondary and unspecified malignant neoplasm of lymph nodes of multiple regions: Secondary | ICD-10-CM | POA: Diagnosis not present

## 2015-08-10 DIAGNOSIS — C50919 Malignant neoplasm of unspecified site of unspecified female breast: Secondary | ICD-10-CM

## 2015-08-10 DIAGNOSIS — C7931 Secondary malignant neoplasm of brain: Secondary | ICD-10-CM

## 2015-08-10 DIAGNOSIS — C787 Secondary malignant neoplasm of liver and intrahepatic bile duct: Secondary | ICD-10-CM

## 2015-08-10 DIAGNOSIS — R5382 Chronic fatigue, unspecified: Secondary | ICD-10-CM

## 2015-08-10 DIAGNOSIS — C7801 Secondary malignant neoplasm of right lung: Secondary | ICD-10-CM

## 2015-08-10 LAB — CBC WITH DIFFERENTIAL/PLATELET
BASO%: 0.3 % (ref 0.0–2.0)
Basophils Absolute: 0 10*3/uL (ref 0.0–0.1)
EOS ABS: 0 10*3/uL (ref 0.0–0.5)
EOS%: 0.1 % (ref 0.0–7.0)
HCT: 31.1 % — ABNORMAL LOW (ref 34.8–46.6)
HGB: 10.3 g/dL — ABNORMAL LOW (ref 11.6–15.9)
LYMPH%: 4.4 % — AB (ref 14.0–49.7)
MCH: 34.9 pg — ABNORMAL HIGH (ref 25.1–34.0)
MCHC: 33.1 g/dL (ref 31.5–36.0)
MCV: 105.3 fL — AB (ref 79.5–101.0)
MONO#: 0.7 10*3/uL (ref 0.1–0.9)
MONO%: 9.8 % (ref 0.0–14.0)
NEUT%: 85.4 % — AB (ref 38.4–76.8)
NEUTROS ABS: 6.3 10*3/uL (ref 1.5–6.5)
PLATELETS: 323 10*3/uL (ref 145–400)
RBC: 2.96 10*6/uL — AB (ref 3.70–5.45)
RDW: 20.3 % — ABNORMAL HIGH (ref 11.2–14.5)
WBC: 7.4 10*3/uL (ref 3.9–10.3)
lymph#: 0.3 10*3/uL — ABNORMAL LOW (ref 0.9–3.3)

## 2015-08-10 LAB — COMPREHENSIVE METABOLIC PANEL
ALT: <9 U/L (ref 0–55)
ANION GAP: 11 meq/L (ref 3–11)
AST: 17 U/L (ref 5–34)
Albumin: 2.4 g/dL — ABNORMAL LOW (ref 3.5–5.0)
Alkaline Phosphatase: 105 U/L (ref 40–150)
BUN: 13.5 mg/dL (ref 7.0–26.0)
CHLORIDE: 94 meq/L — AB (ref 98–109)
CO2: 34 meq/L — AB (ref 22–29)
Calcium: 9.4 mg/dL (ref 8.4–10.4)
Creatinine: 0.6 mg/dL (ref 0.6–1.1)
GLUCOSE: 108 mg/dL (ref 70–140)
POTASSIUM: 3.7 meq/L (ref 3.5–5.1)
SODIUM: 138 meq/L (ref 136–145)
TOTAL PROTEIN: 6.6 g/dL (ref 6.4–8.3)
Total Bilirubin: 0.59 mg/dL (ref 0.20–1.20)

## 2015-08-10 LAB — MAGNESIUM: MAGNESIUM: 1.2 mg/dL — AB (ref 1.5–2.5)

## 2015-08-10 LAB — TSH: TSH: 7.361 m(IU)/L — ABNORMAL HIGH (ref 0.308–3.960)

## 2015-08-10 NOTE — Telephone Encounter (Signed)
Patient feels that she has been "extremely fatigued" since receiving her last treatment.  She is sleeping 6-7 hours out of 24 hours.  She is having significant pain in her neck and shoulder blades.  She is requesting to see Nira Conn today with labs.  She is trying to decide whether she needs to cancel her trip to go to [place] to see her child at [facility].  Patient to come in today for labs and Heather.

## 2015-08-10 NOTE — Progress Notes (Addendum)
And a Patient ID: Nicole Valentine, female   DOB: [birth year], 50 y.o.   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 She had a mammogram performed that showed dense breasts bilaterally. Ultrasound of the right breast showed 2.3 x 1.9 cm area of abnormality with multiple abnormal lymph nodes. MRI of the bilateral breasts showed asymmetrical enhancement throughout the right breast consistent with multicentric disease. An ultrasound-guided biopsy performed. The known area of disease measured 1.9 x 1.9 x 2.3 cm. Multiple abnormal positive right axillary lymph nodes were noted. 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 Biopsy of the right axillary lymph node was positive for malignancy with extracapsular extension. Patient was originally seen by Dr. [name] Dr. [name] and Dr. [name]. She has elected to have a right mastectomy eventually and declined biopsies of any other areas within the breast."  She went on to receive neoadjuvant chemotherapy and attained a complete pathologic response, as documented below  METASTATIC DISEASE: From the prior summary note: [name] noted a very small right supraclavicular mass 09/25/2014, which she brought to our attention. She was having some allergy symptoms at the time so we decided to reevaluate this after a few weeks and on 10/22/2014 as the mass persisted we obtained a restaging neck CT scan, 10/28/2014. There was bilateral supraclavicular adenopathy, but also a large right pleural effusion was  incidentally noted. We proceeded to right thoracentesis 10/29/2014, and a liter of hazy yellow fluid was removed. Cytology from this procedure ([id number]) showed malignant cells consistent with adenocarcinoma, estrogen and progesterone receptor negative, HER-2 not amplified, with a signals ratio 1.12 and the number per cell being 2.75, and with an MIB-1 of 80%. I called [name] with these results and set her up for a PET scan performed 11/06/2014, which shows widespread metastatic disease involving particularly the right lung, liver, and bones, but also the left lung, right adrenal, and multiple lymph node areas.  Her subsequent history is as detailed below.  INTERVAL HISTORY: [name] returns today for follow-up of her metastatic triple negative breast cancer, accompanied by research nurse, Doristine Johns, RN. Today is day 14, cycle 1 of [facility] study protocol [facility] 1525. She was due to for follow up with Dr. [name] tomorrow, but asked to come in a day early because of excessive fatigue. Moving from the living room to the Valentine is taxing. She is drowsy but does not sleep well because she cant get comfortable. She has a new pain to her anterior neck, and continued pain to her shoulder blades. She is using aleve PRN.  Interventional radiology was able to drain 500cc from one area on the right lung, but there is still a moderate pleural effusion that could not be accessed. She met with cardiothoracic surgery yesterday and they are looking to place a pleurx to this side next Wednesday. She does not feel any better since being drained. She continues on 1.5L O2, satting in the mid 90s. She is short of breath with minimal exertion. She has a dry cough. She denies chest pain or palpitations.  REVIEW OF SYSTEMS:  [name]'s appetite is poor. She denies nausea or vomiting. She is constipated, but moving her bowels better since advised to increase her use of magnesium to 2 tablets twice daily. She denies headaches,  dizziness, or vision changes. She has no mouth sores or rashes. Her right arm is chronically swollen from lymphedema. A detailed review of system is otherwise stable.  PAST MEDICAL HISTORY: Past Medical History  Diagnosis Date  . Complication of anesthesia     her father and uncle both had very difficult time waking up-was  something they told them may be hereditory. she will find out.  Nicole Valentine Hypothyroidism   . 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 Radiation 01/21/13-03/06/13    Right Breast  . Breast cancer ([name]) dx'd 04-10-12-rt    PAST SURGICAL HISTORY: Past Surgical History  Procedure Laterality Date  . Wisdom tooth extraction    . Portacath placement  04/22/2012    Procedure: INSERTION PORT-A-CATH;  Surgeon: [name], MD;  Location: [facility];  Service: General;  Laterality: Left;  Nicole Valentine Modified mastectomy Right 11/18/2012    Procedure:  RIGHT MODIFIED MASTECTOMY;  Surgeon: [name], MD;  Location: [facility];  Service: General;  Laterality: Right;  . Knee arthroscopy Right 11/26/2013  . Port-a-cath removal Left 12/10/2013    Procedure: MINOR REMOVAL PORT-A-CATH;  Surgeon: [name], MD;  Location: [facility];  Service: General;  Laterality: Left;    FAMILY HISTORY Family History  Problem Relation Age of Onset  . Lung cancer Maternal Grandfather 21    smoker  . Prostate cancer Maternal Grandfather 90  . Throat cancer Other     Great Aunt x 2  . Liver cancer Other     Maternal Great Grandmother  . Melanoma Maternal Uncle 81  . Brain cancer Cousin 11    non-malignant  . Allergic rhinitis Neg Hx   . Asthma Neg Hx   . Eczema Neg Hx   . Urticaria Neg Hx   the patient's parents are living, in their early 71s. The patient has 2 brothers, no sisters. The patient's mother was diagnosed with breast cancer, HER-2 positive, in 2014 in [place].  GYNECOLOGIC HISTORY:   (Updated 10/03/2013) Menarche age 38, first live  birth age 1. She is GX P4. LMP January 2014. 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  The children are currently ages 72, 67, 3, and 50. Her husband, [name], is a Customer service manager.He just started a job for [employer].  They attend the [employer] DIRECTIVES: The patient's husband is her HCPOA   HEALTH MAINTENANCE:  (Updated 10/03/2013) Social History  Substance Use Topics  . Smoking status: Never Smoker   . Smokeless tobacco: Never Used  . Alcohol Use: No     Colonoscopy: Never  PAP: November 2013/Dr. Smith  Bone density: January 2015, [facility], normal  Lipid panel:  Not on file  Allergies  Allergen Reactions  . Almond Meal Rash  . Almond Oil Rash  . Other Rash    LOTIONS-unknown type  . Tegaderm Ag Mesh [Silver] Rash  . Vancomycin Rash    Red Man Syndrome    Current Outpatient Prescriptions  Medication Sig Dispense Refill  . cholecalciferol (VITAMIN D) 1000 UNITS tablet Take 2,000 Units by mouth 2 (two) times daily.     Nicole Valentine  levothyroxine (SYNTHROID, LEVOTHROID) 175 MCG tablet Take 175 mcg by mouth daily before breakfast.    . LORazepam (ATIVAN) 0.5 MG tablet Take 1 tablet (0.5 mg total) by mouth at bedtime as needed (Nausea or vomiting). 60 tablet 2  . magnesium oxide (MAG-OX) 400 MG tablet Take 600 mg by mouth daily. Pt takes 2 capsules  by mouth 3 times daily.    . pantoprazole (PROTONIX) 40 MG tablet Take 1 tablet (40 mg total) by mouth daily at 12 noon. 30 tablet 2  . Saline 0.2 % GEL Place 1 Squirt into the nose at bedtime.    . sodium chloride (OCEAN) 0.65 % SOLN nasal spray Place 1 spray into both nostrils QID.    Nicole Valentine dextromethorphan-guaiFENesin (MUCINEX DM) 30-600 MG 12hr tablet Take 1 tablet by mouth 2 (two) times daily as needed for cough. Reported on 08/10/2015    . levofloxacin (LEVAQUIN) 500 MG tablet Take 1 tablet (500 mg total) by mouth daily.  (Patient not taking: Reported on 08/10/2015) 7 tablet 0  . Loratadine-Pseudoephedrine (CLARITIN-D 24 HOUR PO) Take 1 tablet by mouth every morning. Reported on 08/10/2015     No current facility-administered medications for this visit.      OBJECTIVE: Middle-aged white woman who appears stated age  32 Vitals:   08/10/15 1446  BP: 110/66  Pulse: 116  Temp: 97.7 F (36.5 C)  Resp: 18     Body mass index is 20.23 kg/(m^2).    ECOG FS:1 - Symptomatic but completely ambulatory Filed Weights   08/10/15 1446  Weight: 129 lb 3.2 oz (58.605 kg)   Skin: warm, dry  HEENT: sclerae anicteric, conjunctivae pink, oropharynx clear. No thrush or mucositis.  Lymph Nodes: No cervical or supraclavicular lymphadenopathy  Lungs: severely diminished lung sounds throughout the right lung field. Clear ascultation to left. No rales or wheezed noted.  Heart: regular rate and rhythm  Abdomen: round, soft, non tender, positive bowel sounds  Musculoskeletal: No focal spinal tenderness, grade 1 right upper extremity lymphedema Neuro: non focal, well oriented, positive affect  Breasts: deferred  LAB RESULTS:   Lab Results  Component Value Date   WBC 7.4 08/10/2015   NEUTROABS 6.3 08/10/2015   HGB 10.3* 08/10/2015   HCT 31.1* 08/10/2015   MCV 105.3* 08/10/2015   PLT 323 08/10/2015      Chemistry      Component Value Date/Time   NA 138 08/10/2015 1428   NA 137 04/21/2015 0546   K 3.7 08/10/2015 1428   K 3.5 04/21/2015 0546   CL 101 04/21/2015 0546   CL 101 10/18/2012 0859   CO2 34* 08/10/2015 1428   CO2 28 04/21/2015 0546   BUN 13.5 08/10/2015 1428   BUN 10 04/21/2015 0546   CREATININE 0.6 08/10/2015 1428   CREATININE 0.48 04/21/2015 0546      Component Value Date/Time   CALCIUM 9.4 08/10/2015 1428   CALCIUM 8.3* 04/21/2015 0546   ALKPHOS 105 08/10/2015 1428   ALKPHOS 94 04/21/2015 0546   AST 17 08/10/2015 1428   AST 17 04/21/2015 0546   ALT <9 08/10/2015 1428   ALT 10* 04/21/2015  0546   BILITOT 0.59 08/10/2015 1428   BILITOT 0.6 04/21/2015 0546       STUDIES: Dg Chest 1 View  08/09/2015  CLINICAL DATA:  Post right thoracentesis, history of breast cancer EXAM: CHEST 1 VIEW COMPARISON:  08/03/2015 FINDINGS: Cardiomediastinal silhouette is stable. Moderate size right pleural effusion again noted. Surgical clips in right axilla.  There is no pneumothorax. Small left pleural effusion. Small left basilar atelectasis. Stable atelectasis in right lower lobe. Left IJ Port-A-Cath with tip in SVC is unchanged in position. IMPRESSION: Moderate size right pleural effusion again noted. Surgical clips in right axilla. There is no pneumothorax. Small left pleural effusion. Small left basilar atelectasis. Stable atelectasis in right lower lobe. Left IJ Port-A-Cath with tip in SVC is unchanged in position. Electronically Signed   By: Lahoma Crocker M.D.   On: 08/09/2015 12:45   Dg Chest 1 View  08/03/2015  CLINICAL DATA:  Left-sided thoracentesis, evaluate for pneumothorax EXAM: CHEST 1 VIEW COMPARISON:  CT chest of 08/02/2015 FINDINGS: Much of the left pleural effusion has been evacuated after left thoracentesis. Only a tiny left pleural effusion remains with left basilar atelectasis. No pneumothorax is seen. A large right pleural effusion remains with considerable volume loss throughout the right lung. A left-sided Port-A-Cath is noted with the tip seen to overlie the mid SVC. Heart size is stable. Surgical clips overlie the right axilla from prior right mastectomy. IMPRESSION: 1. New pneumothorax after removal of much of the left pleural effusion by left thoracentesis. 2. No change in large right pleural effusion with considerable volume loss of the right lung. Electronically Signed   By: Ivar Drape M.D.   On: 08/03/2015 13:03   Dg Chest 2 View  08/02/2015  CLINICAL DATA:  Breast cancer, increased shortness of breath for 4 days, cough, has had thoracentesis performed several times before on  RIGHT EXAM: CHEST  2 VIEW COMPARISON:  CT chest 07/14/2015 FINDINGS: LEFT jugular Port-A-Cath with tip projecting over SVC. Stable heart size and mediastinal contours. Large RIGHT and small LEFT pleural effusions, increased on LEFT and slightly increased on RIGHT. Significant atelectasis in lower lungs particularly on RIGHT. No pneumothorax. Post RIGHT mastectomy and axillary node dissection. No acute osseous lesions. IMPRESSION: Increased BILATERAL pleural effusions and basilar atelectasis since prior exam. Electronically Signed   By: Lavonia Dana M.D.   On: 08/02/2015 12:53   Ct Chest W Contrast  07/14/2015  CLINICAL DATA:  Restaging breast cancer diagnosed in 2013 with metastatic disease to bone, liver and right lung. Chemotherapy ongoing. Recist protocol patient. EXAM: CT CHEST, ABDOMEN, AND PELVIS WITH CONTRAST TECHNIQUE: Multidetector CT imaging of the chest, abdomen and pelvis was performed following the standard protocol during bolus administration of intravenous contrast. CONTRAST:  18m OMNIPAQUE IOHEXOL 300 MG/ML  SOLN COMPARISON:  Prior CTs 04/19/2015.  PET-CT 03/17/2015. FINDINGS: RECIST 1.1 Target Lesions: 1. Right hilar node measures 8 mm on image 25 (previously 12 mm). 2. Subcarinal node measures 10 mm on image 29 (previously 10 mm). 3. Right retrocrural node measures 10 mm on image 54 (previously 13 mm). 4. Central hepatic lesion measures 2.4 cm on image 5 of series 4 (previously 2.1 cm). 5. Right adrenal lesion measures 2.2 cm on image 58 (previously 3.1 cm). Non-target Lesions: 1. Right pleural disease, subjectively improved, although difficult to measure. 2. Right lung airspace disease, subjectively improved. 3. Other hepatic metastases, mixed response. CT CHEST Mediastinum/Nodes: Previously demonstrated hypermetabolic right supraclavicular lymph node is incompletely visualized, although may be slightly smaller, measuring 6 mm on image 1. There are no enlarged axillary or internal mammary  lymph nodes. There are multiple small mediastinal and hilar lymph nodes which have improved compared with the prior study. These are difficult to accurately measure given their confluent nature. A right hilar node which previously measured 12 mm now measures 8 mm on image 25.  A subcarinal node which previously measured 10 mm measures 10 mm on image 29. No progressive adenopathy. The heart size is stable. There is a stable small pericardial effusion. Left IJ Port-A-Cath extends to the cavoatrial junction. Lungs/Pleura: The complex loculated right pleural effusion has mildly decreased in volume status post interval thoracentesis. There is still diffuse right pleural thickening and enhancement, although the focal nodularity previously noted at the posterior costophrenic angle has improved. Again, this is not easily measurable given its irregular shape. A small amount of simple dependent pleural fluid on the left appears unchanged. There has been interval improved aeration of the right middle and lower lobes with persistent volume loss, air bronchograms and septal thickening. No measurable lesion is seen within the right middle lobe. No suspicious findings in the left lung. There is a stable tiny left upper lobe nodule measuring 3 mm on image 20. There is stable dependent left lower lobe atelectasis. Musculoskeletal/Chest wall: Right mastectomy and axillary node dissection. No evidence of chest wall recurrence or suspicious osseous finding. The T9 superior endplate compression deformity is unchanged. CT ABDOMEN AND PELVIS FINDINGS Hepatobiliary: The early images through the liver were obtained prior to opacification of the hepatic veins, limiting parenchymal assessment. The individual lesions are probably best measured on the delayed images (series 5). The largest lesion centrally in the liver measures 2.4 x 1.7 cm on image number 5 (previously 2.1 x 1.4 cm). The other smaller lesions demonstrate a mixed response. For  example, the previously referenced lesions appear smaller, including a right lobe lesion (9 mm on image 12, previously 15 mm) and the left lobe lesion on image number 4 (6 mm, previously 10 mm). Other lesions appear larger, including a 10 mm lesion in segment 7 on image 5. The gallbladder appears normal. There is no biliary dilatation. Pancreas: Unremarkable. No pancreatic ductal dilatation or surrounding inflammatory changes. Spleen: Normal in size without focal abnormality. Adrenals/Urinary Tract: The right adrenal lesion appears smaller, measuring 2.2 x 1.2 cm on image 58 (previously 3.1 x 1.3 cm. The left adrenal gland appears normal. The kidneys appear normal without evidence of urinary tract calculus, suspicious lesion or hydronephrosis. No bladder abnormalities are seen. Stomach/Bowel: No evidence of bowel wall thickening, distention or surrounding inflammatory change. The pneumatosis and free air demonstrated previously have resolved. Vascular/Lymphatic: Right retrocrural node measures 10 mm on image 54 (previously 13 mm). No other enlarged abdominal or pelvic lymph nodes are seen. Stable atherosclerosis of the aorta, its branches and the iliac arteries. Reproductive: The uterus and ovaries appear unremarkable. No adnexal mass. Other: No ascites or peritoneal nodularity. Musculoskeletal: No acute or significant osseous findings. IMPRESSION: 1. Interval improvement in mediastinal lymphadenopathy and nodular pleural disease on the right. Residual disease is difficult to measure given its confluent nature. Moderate size complex right pleural effusion has mildly decreased in volume. 2. Interval improved right lung airspace disease. Remaining density may reflect combination of lymphangitic tumor, atelectasis and postobstructive pneumonitis. 3. Mixed response of hepatic metastatic disease. 4. The right adrenal metastasis is slightly smaller. 5. Interval resolution of pneumoperitoneum and pneumatosis.  Electronically Signed   By: Richardean Sale M.D.   On: 07/14/2015 15:40   Ct Angio Chest Pe W/cm &/or Wo Cm  08/02/2015  CLINICAL DATA:  Shortness of breath and fatigued with hypoxia. Upper and lower extremity swelling right greater than left. History of metastatic breast cancer. EXAM: CT ANGIOGRAPHY CHEST WITH CONTRAST TECHNIQUE: Multidetector CT imaging of the chest was performed using the standard protocol  during bolus administration of intravenous contrast. Multiplanar CT image reconstructions and MIPs were obtained to evaluate the vascular anatomy. CONTRAST:  65.18m OMNIPAQUE IOHEXOL 350 MG/ML SOLN COMPARISON:  07/14/2015 FINDINGS: Left-sided Port-A-Cath with tip in the SVC. Exam demonstrates slightly more pronounced 9 mm sub solid nodular opacity over the right upper lobe. Slight interval worsening of a moderate size complex right pleural effusion with diffuse pleural thickening. Slight interval worsening consolidation over the right midlung and right lower lobe likely atelectatic change although cannot exclude concomitant infection. Interval worsening of moderate size left pleural effusion with associated atelectasis in the left base. Stable subpleural 2 mm nodule over the lingula. No evidence of pulmonary embolism. Slight interval worsening of small to moderate pericardial effusion. Stable mediastinal lymph nodes not as well defined on the current exam. Stable small nodes over the neck base/ supraclavicular regions. Remainder of the mediastinal unchanged. Limited images through the upper abdomen demonstrate evidence of known liver metastases without significant change. Slight increase in size of known adrenal metastasis measuring 1.4 x 3.2 cm (previously 1.2 x 2.2 cm). Sub cm hypervascular nodule over the spleen. Stable likely pathologic mild compression fracture of T10. Stable sclerotic focus of the base of the T1 spinous process. Review of the MIP images confirms the above findings. IMPRESSION: No  evidence of pulmonary embolism. Interval worsening of moderate size complex right pleural effusion with associated worsening consolidation over the right midlung and right lower lobe likely atelectasis although cannot exclude concomitant infection. Stable diffuse right pleural thickening as well as stable small nodes over the neck base and mediastinum in this patient with known metastatic breast cancer. Interval worsening moderate size left pleural effusion with associated basilar atelectasis. Stable known osseous, adrenal and liver metastases. Sub cm hyperdense nodule over the spleen as cannot exclude underlying metastatic lesion. Recommend attention on follow-up. Electronically Signed   By: DMarin OlpM.D.   On: 08/02/2015 16:11   Nm Bone Scan Whole Body  07/22/2015  CLINICAL DATA:  Breast malignancy, right groin pain, no history of trauma EXAM: NUCLEAR MEDICINE WHOLE BODY BONE SCAN TECHNIQUE: Whole body anterior and posterior images were obtained approximately 3 hours after intravenous injection of radiopharmaceutical. RADIOPHARMACEUTICALS:  26.5 mCi Technetium-929mDP IV COMPARISON:  PET-CT study of March 17, 2015 FINDINGS: There is adequate uptake of the radiopharmaceutical by the skeleton. There is adequate soft tissue clearance and renal activity. Uptake within the calvarium is normal. Uptake within the cervical spine reveals a focus posterior likely within the spinous process of T7. There is a small focus of increased uptake in the pedicle of L1. There is increased uptake within the left aspect of the manubrium and the lowermost sternal segment. There is asymmetrically increased uptake in the humeral head on the left as compared to the right. There is mildly increased uptake in the roof of the right acetabulum and in the right femoral head. Activity more distally in the lower extremities is normal with exception of a tiny focus of uptake associated with the right knee. IMPRESSION: 1. Abnormally  increased uptake within the left humeral head, the right femoral head, the roof of the right acetabulum is suspicious for metastatic disease. Plain film evaluation of these regions is recommended. 2. Activity within the spine and sternum is most likely degenerative in nature. Electronically Signed   By: David  JoMartinique.D.   On: 07/22/2015 13:30   Ct Abdomen Pelvis W Contrast  07/14/2015  CLINICAL DATA:  Restaging breast cancer diagnosed in 2013 with metastatic  disease to bone, liver and right lung. Chemotherapy ongoing. Recist protocol patient. EXAM: CT CHEST, ABDOMEN, AND PELVIS WITH CONTRAST TECHNIQUE: Multidetector CT imaging of the chest, abdomen and pelvis was performed following the standard protocol during bolus administration of intravenous contrast. CONTRAST:  126m OMNIPAQUE IOHEXOL 300 MG/ML  SOLN COMPARISON:  Prior CTs 04/19/2015.  PET-CT 03/17/2015. FINDINGS: RECIST 1.1 Target Lesions: 1. Right hilar node measures 8 mm on image 25 (previously 12 mm). 2. Subcarinal node measures 10 mm on image 29 (previously 10 mm). 3. Right retrocrural node measures 10 mm on image 54 (previously 13 mm). 4. Central hepatic lesion measures 2.4 cm on image 5 of series 4 (previously 2.1 cm). 5. Right adrenal lesion measures 2.2 cm on image 58 (previously 3.1 cm). Non-target Lesions: 1. Right pleural disease, subjectively improved, although difficult to measure. 2. Right lung airspace disease, subjectively improved. 3. Other hepatic metastases, mixed response. CT CHEST Mediastinum/Nodes: Previously demonstrated hypermetabolic right supraclavicular lymph node is incompletely visualized, although may be slightly smaller, measuring 6 mm on image 1. There are no enlarged axillary or internal mammary lymph nodes. There are multiple small mediastinal and hilar lymph nodes which have improved compared with the prior study. These are difficult to accurately measure given their confluent nature. A right hilar node which previously  measured 12 mm now measures 8 mm on image 25. A subcarinal node which previously measured 10 mm measures 10 mm on image 29. No progressive adenopathy. The heart size is stable. There is a stable small pericardial effusion. Left IJ Port-A-Cath extends to the cavoatrial junction. Lungs/Pleura: The complex loculated right pleural effusion has mildly decreased in volume status post interval thoracentesis. There is still diffuse right pleural thickening and enhancement, although the focal nodularity previously noted at the posterior costophrenic angle has improved. Again, this is not easily measurable given its irregular shape. A small amount of simple dependent pleural fluid on the left appears unchanged. There has been interval improved aeration of the right middle and lower lobes with persistent volume loss, air bronchograms and septal thickening. No measurable lesion is seen within the right middle lobe. No suspicious findings in the left lung. There is a stable tiny left upper lobe nodule measuring 3 mm on image 20. There is stable dependent left lower lobe atelectasis. Musculoskeletal/Chest wall: Right mastectomy and axillary node dissection. No evidence of chest wall recurrence or suspicious osseous finding. The T9 superior endplate compression deformity is unchanged. CT ABDOMEN AND PELVIS FINDINGS Hepatobiliary: The early images through the liver were obtained prior to opacification of the hepatic veins, limiting parenchymal assessment. The individual lesions are probably best measured on the delayed images (series 5). The largest lesion centrally in the liver measures 2.4 x 1.7 cm on image number 5 (previously 2.1 x 1.4 cm). The other smaller lesions demonstrate a mixed response. For example, the previously referenced lesions appear smaller, including a right lobe lesion (9 mm on image 12, previously 15 mm) and the left lobe lesion on image number 4 (6 mm, previously 10 mm). Other lesions appear larger,  including a 10 mm lesion in segment 7 on image 5. The gallbladder appears normal. There is no biliary dilatation. Pancreas: Unremarkable. No pancreatic ductal dilatation or surrounding inflammatory changes. Spleen: Normal in size without focal abnormality. Adrenals/Urinary Tract: The right adrenal lesion appears smaller, measuring 2.2 x 1.2 cm on image 58 (previously 3.1 x 1.3 cm. The left adrenal gland appears normal. The kidneys appear normal without evidence of urinary tract calculus,  suspicious lesion or hydronephrosis. No bladder abnormalities are seen. Stomach/Bowel: No evidence of bowel wall thickening, distention or surrounding inflammatory change. The pneumatosis and free air demonstrated previously have resolved. Vascular/Lymphatic: Right retrocrural node measures 10 mm on image 54 (previously 13 mm). No other enlarged abdominal or pelvic lymph nodes are seen. Stable atherosclerosis of the aorta, its branches and the iliac arteries. Reproductive: The uterus and ovaries appear unremarkable. No adnexal mass. Other: No ascites or peritoneal nodularity. Musculoskeletal: No acute or significant osseous findings. IMPRESSION: 1. Interval improvement in mediastinal lymphadenopathy and nodular pleural disease on the right. Residual disease is difficult to measure given its confluent nature. Moderate size complex right pleural effusion has mildly decreased in volume. 2. Interval improved right lung airspace disease. Remaining density may reflect combination of lymphangitic tumor, atelectasis and postobstructive pneumonitis. 3. Mixed response of hepatic metastatic disease. 4. The right adrenal metastasis is slightly smaller. 5. Interval resolution of pneumoperitoneum and pneumatosis. Electronically Signed   By: Richardean Sale M.D.   On: 07/14/2015 15:40   US Thoracentesis Asp Pleural Space W/img Guide  08/09/2015  INDICATION: Breast cancer, dyspnea, malignant right pleural effusion. Request made for therapeutic  right thoracentesis. EXAM: ULTRASOUND GUIDED THERAPEUTIC RIGHT THORACENTESIS MEDICATIONS: None. COMPLICATIONS: None immediate. PROCEDURE: An ultrasound guided thoracentesis was thoroughly discussed with the patient and questions answered. The benefits, risks, alternatives and complications were also discussed. The patient understands and wishes to proceed with the procedure. Written consent was obtained. Ultrasound was performed to localize and mark an adequate pocket of fluid in the right chest. The area was then prepped and draped in the normal sterile fashion. 1% Lidocaine was used for local anesthesia. Under ultrasound guidance a Safe-T-Centesis catheter was introduced. Thoracentesis was performed. The catheter was removed and a dressing applied. FINDINGS: A total of approximately 500 cc of amber fluid was removed. The pleural fluid collection was multi-septated. Only the above amount of fluid was removed at this time secondary to persistent patient coughing and chest discomfort. IMPRESSION: Successful ultrasound guided therapeutic right thoracentesis yielding 500 cc of pleural fluid. Read by: Rowe Robert, PA-C Electronically Signed   By: Corrie Mckusick D.O.   On: 08/09/2015 12:21   US Thoracentesis Asp Pleural Space W/img Guide  08/03/2015  INDICATION: Breast cancer with recurrent pleural effusions. History of multiple right-sided thoracentesis. Patient presents today for thoracentesis. EXAM: ULTRASOUND GUIDED DIAGNOSTIC AND THERAPEUTIC THORACENTESIS MEDICATIONS: 1% lidocaine COMPLICATIONS: None immediate. PROCEDURE: An ultrasound guided thoracentesis was thoroughly discussed with the patient and questions answered. The benefits, risks, alternatives and complications were also discussed. The patient understands and wishes to proceed with the procedure. Written consent was obtained. Ultrasound was performed to localize and mark an adequate pocket of fluid in the left chest. Fluid was noted on the right and  left side. The right side of her chest had loculated fluid. After a discussion with Dr. Laurence Ferrari, it was felt that a thoracentesis of the left side would be more beneficial to the patient as we would likely remove more fluid to help with her shortness of breath. The left chest was then prepped and draped in the normal sterile fashion. 1% Lidocaine was used for local anesthesia. A Safe-T-Centesis catheter was introduced. Thoracentesis was performed. The catheter was removed and a dressing applied. FINDINGS: A total of approximately 0.9 L of somewhat cloudy yellow fluid was removed. Samples were sent to the laboratory as requested by the clinical team. IMPRESSION: Successful ultrasound guided left thoracentesis yielding 0.9 L of pleural fluid.  Read by: Saverio Danker, PA-C Electronically Signed   By: Jacqulynn Cadet M.D.   On: 08/03/2015 13:22    ASSESSMENT: 50 y.o. BRCA negative [place] woman with triple-negative stage IV breast cancer  (1) an status post right breast upper outer quadrant and right axillary lymph node biopsy 04/04/2012, both positive for an invasive ductal carcinoma, grade 3, triple negative, with an MIB-1 of 25%  (2) Treated neoadjuvantly with  (a) fluorouracil, cyclophosphamide, and epirubicin (at 100 mg/M2) x4 completed 06/07/2012  (b) docetaxel (75 mg/M2) for one dose, 06/21/2012, poorly tolerated  (c) carboplatin and gemcitabine given every 21 days for 6 cycles completed 10/11/2012  (3) status post right modified radical mastectomy 11/18/2012 showing a complete pathologic response--all 16 lymph nodes were benign  (a) no plans for reconstructon  (4) adjuvant radiation therapy completed 03/06/2013  METASTATIC DISEASE June 2016 (5) 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, 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  (6) staging PET scan 11/06/2014 showed significant disease in the middle and lower lobes of the Right lung, Right effusion, multiple liver and  bone lesions, as well as left lung nodules, a Right adrenal nodule, and widespread hypermetabolic adenopathy  (a) brain MRI 11/16/2014 showed multiple cerebellar lesions  (7) RADIATION TREATMENTS thoracic metastases  (a) 01/28/2015-02/11/2015: Thoracic Spine T8-10, 30 Gy in 10 fractions  (7) abraxane day 1 and day 8 of each 21 day cycle started 11/09/2014: last dose 03/12/2015 (6 cycles)  (a) PET scan 01/05/2015 shows an excellent initial response after 3 cycles  (b) PET scan 03/17/2015 shows progression  (8) zolendronate started 11/16/2014, to be repeated every 12 weeks, most recent dose 02/09/2015  (9) [facility] 1 study sent  11/06/2014: patient's sister in law Garvin Fila following [phone])  (a) 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[place], NTRK1, MYC, ARID1A and LYN, none w approved therapies  (b) pazopanib, ponatinib or crizotinib suggested as possible off-protocol options  (10) RADIATION TREATMENTS: Brain metastases:  (a) s/p SRS therapy 12/11/2014 to 4 right cerebellar lesions  (b) multiple (18+) lesions noted on repeat brain MRI 03/16/2015  (c) whole brain irradiation completed 04/12/2015  (11) uncontrolled pain: Started on OxyContin 10 mg twice a day with oxycodone 5 mg as needed 03/26/2015  (12) Right pleural effusion  (a) s/p thoracentesis July 2016 (x2), 03/30/2015  (13) started eribulin11/29/2016, originally planned to be given day one and day 8 of each 21 day cycle, but switched to every 14 days with onpro support because of persistent leukopenia. Discontinued 05/25/14, after 2 cycles, w no evidence of response  (14) cisplatin started 05/28/2015, to be repeated every 21 days. Last dose given 07/08/15. 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   (15) [facility] study protocol [facility] 2409 started 07/28/15. Consists of pembrolizumab every 21 days. Cyclophosphamide given as a "primer" on day 1, cycle 1 only.   PLAN: I have written Enora a letter of medical necessity to hopefully absolve her of  the cost of the plane tickets to [place]. I reminded her to take plenty of breaks on the road to stop and stretch, and also to map out the location of major medical centers along her route in case her condition declines. I advised her to use oxycodone PRN at least at night to help get her comfortable enough to sleep for a few hours.  I don't have a great understanding of her overwhelming fatigue. She not significantly more anemic. Her eating and drinking habits are no worse. She is going to continue her protein supplement daily. After the conclusion  of our visit, her TSH returned and it is 3 times higher than it was prior to the start of therapy, suggesting a state of hypothyroidism that could cause fatigue. The T4 has not returned yet. She is on 1110mg synthroid daily and is compliant with this medicine to our knowledge. It is possible the immunotherapy has affected her thyroid gland. She did complain of throat tenderness. She may need to go up on the synthroid dose.   Her magnesium is critical again, at 1.2. I am setting her up for 2g magnesium tomorrow. Prior to this she will have a follow up visit with Dr. MJana Hakim She understands and agrees with this plan. She has been encouraged to call with any issues that might arise before her next visit here.  HLaurie Panda NP  08/10/2015

## 2015-08-10 NOTE — Telephone Encounter (Signed)
Spoke with patient today re lab/HB today to arrive at 2 pm.

## 2015-08-11 ENCOUNTER — Ambulatory Visit (HOSPITAL_BASED_OUTPATIENT_CLINIC_OR_DEPARTMENT_OTHER): Payer: BLUE CROSS/BLUE SHIELD | Admitting: Oncology

## 2015-08-11 ENCOUNTER — Other Ambulatory Visit: Payer: BLUE CROSS/BLUE SHIELD

## 2015-08-11 ENCOUNTER — Other Ambulatory Visit: Payer: Self-pay | Admitting: Nurse Practitioner

## 2015-08-11 ENCOUNTER — Ambulatory Visit (HOSPITAL_BASED_OUTPATIENT_CLINIC_OR_DEPARTMENT_OTHER): Payer: BLUE CROSS/BLUE SHIELD

## 2015-08-11 ENCOUNTER — Ambulatory Visit: Payer: BLUE CROSS/BLUE SHIELD

## 2015-08-11 ENCOUNTER — Inpatient Hospital Stay (HOSPITAL_COMMUNITY): Admission: RE | Admit: 2015-08-11 | Payer: BLUE CROSS/BLUE SHIELD | Source: Ambulatory Visit

## 2015-08-11 VITALS — BP 100/69 | HR 116 | Temp 97.3°F | Resp 18 | Ht 67.0 in | Wt 128.9 lb

## 2015-08-11 DIAGNOSIS — C50411 Malignant neoplasm of upper-outer quadrant of right female breast: Secondary | ICD-10-CM | POA: Diagnosis not present

## 2015-08-11 DIAGNOSIS — C7931 Secondary malignant neoplasm of brain: Secondary | ICD-10-CM

## 2015-08-11 DIAGNOSIS — C50919 Malignant neoplasm of unspecified site of unspecified female breast: Secondary | ICD-10-CM

## 2015-08-11 DIAGNOSIS — C7951 Secondary malignant neoplasm of bone: Secondary | ICD-10-CM

## 2015-08-11 DIAGNOSIS — C50911 Malignant neoplasm of unspecified site of right female breast: Secondary | ICD-10-CM

## 2015-08-11 LAB — T3: Triiodothyronine (T3): 52 ng/dL — ABNORMAL LOW (ref 71–180)

## 2015-08-11 LAB — PHOSPHORUS: Phosphorus, Ser: 4 mg/dL (ref 2.5–4.5)

## 2015-08-11 LAB — T4, FREE: T4,Free(Direct): 1.43 ng/dL (ref 0.82–1.77)

## 2015-08-11 MED ORDER — OXYCODONE HCL ER 10 MG PO T12A: 10.0000 mg | EXTENDED_RELEASE_TABLET | Freq: Two times a day (BID) | ORAL | Status: AC

## 2015-08-11 MED ORDER — OXYCODONE HCL 5 MG PO TABS: 5.0000 mg | ORAL_TABLET | Freq: Four times a day (QID) | ORAL | Status: AC | PRN

## 2015-08-11 MED ORDER — HEPARIN SOD (PORK) LOCK FLUSH 100 UNIT/ML IV SOLN
500.0000 [IU] | Freq: Once | INTRAVENOUS | Status: AC
  Administered 2015-08-11: 500 [IU] via INTRAVENOUS
  Filled 2015-08-11: qty 5

## 2015-08-11 MED ORDER — SODIUM CHLORIDE 0.9 % IV SOLN
Freq: Once | INTRAVENOUS | Status: AC
  Administered 2015-08-11: 13:00:00 via INTRAVENOUS
  Filled 2015-08-11: qty 500

## 2015-08-11 MED ORDER — SODIUM CHLORIDE 0.9% FLUSH
10.0000 mL | INTRAVENOUS | Status: AC | PRN
  Administered 2015-08-11: 10 mL via INTRAVENOUS
  Filled 2015-08-11: qty 10

## 2015-08-11 NOTE — Patient Instructions (Signed)
Hypomagnesemia °Hypomagnesemia is a condition in which the level of magnesium in the blood is low. Magnesium is a mineral that is found in many foods. It is used in many different processes in the body. Hypomagnesemia can affect every organ in the body. It can cause life-threatening problems. °CAUSES °Causes of hypomagnesemia include: °· Not getting enough magnesium in your diet. °· Malnutrition. °· Problems with absorbing magnesium from the intestines. °· Dehydration. °· Alcohol abuse. °· Vomiting. °· Severe diarrhea. °· Some medicines, including medicines that make you urinate more. °· Certain diseases, such as kidney disease, diabetes, and overactive thyroid. °SIGNS AND SYMPTOMS °· Involuntary shaking or trembling of a body part (tremor). °· Confusion. °· Muscle weakness. °· Sensitivity to light, sound, and touch. °· Psychiatric issues, such as depression, irritability, or psychosis. °· Sudden tightening of muscles (muscle spasms). °· Tingling in the arms and legs. °· A feeling of fluttering of the heart. °These symptoms are more severe if magnesium levels drop suddenly. °DIAGNOSIS °To make a diagnosis, your health care provider will do a physical exam and order blood and urine tests. °TREATMENT °Treatment will depend on the cause and the severity of your condition. It may involve: °· A magnesium supplement. This can be taken in pill form. It can also be given through an IV tube. This is usually done if the condition is severe. °· Changes to your diet. You may be directed to eat foods that have a lot of magnesium, such as green leafy vegetables, peas, beans, and nuts. °· Eliminating alcohol from your diet. °HOME CARE INSTRUCTIONS °· Include foods with magnesium in your diet. Foods that are rich in magnesium include green vegetables, beans, nuts and seeds, and whole grains. °· Take medicines only as directed by your health care provider. °· Take magnesium supplements if your health care provider instructs you to  do that. Take them as directed. °· Have your magnesium levels monitored as directed by your health care provider. °· When you are active, drink fluids that contain electrolytes. °· Keep all follow-up visits as directed by your health care provider. This is important. °SEEK MEDICAL CARE IF: °· You get worse instead of better. °· Your symptoms return. °SEEK IMMEDIATE MEDICAL CARE IF: °· Your symptoms are severe. °  °This information is not intended to replace advice given to you by your health care provider. Make sure you discuss any questions you have with your health care provider. °  °Document Released: 02/01/2005 Document Revised: 05/29/2014 Document Reviewed: 12/22/2013 °Elsevier Interactive Patient Education ©2016 Elsevier Inc. ° °

## 2015-08-11 NOTE — Progress Notes (Signed)
And a Patient ID: Nicole Valentine, female   DOB: 01/20/1966, 50 y.o.   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 She had a mammogram performed that showed dense breasts bilaterally. Ultrasound of the right breast showed 2.3 x 1.9 cm area of abnormality with multiple abnormal lymph nodes. MRI of the bilateral breasts showed asymmetrical enhancement throughout the right breast consistent with multicentric disease. An ultrasound-guided biopsy performed. The known area of disease measured 1.9 x 1.9 x 2.3 cm. Multiple abnormal positive right axillary lymph nodes were noted. 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 Biopsy of the right axillary lymph node was positive for malignancy with extracapsular extension. Patient was originally seen by Dr. [name] Dr. [name] and Dr. [name]. She has elected to have a right mastectomy eventually and declined biopsies of any other areas within the breast."  She went on to receive neoadjuvant chemotherapy and attained a complete pathologic response, as documented below  METASTATIC DISEASE: From the prior summary note: [name] noted a very small right supraclavicular mass 09/25/2014, which she brought to our attention. She was having some allergy symptoms at the time so we decided to reevaluate this after a few weeks and on 10/22/2014 as the mass persisted we obtained a restaging neck CT scan, 10/28/2014. There was bilateral supraclavicular adenopathy, but also a large  right pleural effusion was incidentally noted. We proceeded to right thoracentesis 10/29/2014, and a liter of hazy yellow fluid was removed. Cytology from this procedure ([id number]) showed malignant cells consistent with adenocarcinoma, estrogen and progesterone receptor negative, HER-2 not amplified, with a signals ratio 1.12 and the number per cell being 2.75, and with an MIB-1 of 80%. I called Kenzie with these results and set her up for a PET scan performed 11/06/2014, which shows widespread metastatic disease involving particularly the right lung, liver, and bones, but also the left lung, right adrenal, and multiple lymph node areas.  Her subsequent history is as detailed below.  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 Today is day 15, cycle 1 of study protocol [facility] 1525  REVIEW OF SYSTEMS:  [name]  Did fine with her first pembrolizumab infusion. That night however she became short of breath and by the next day she was feeling increasing fatigue. Both the shortness of breath and fatigue Persisted and to some extent even increased. She did undergo left thoracentesis of 500 mL of serosanguineous fluid without significant relief. She was referred to  [name] in thoracic surgery for consideration of right Pleurx placement.-- This by the shortness of breath she denies cough or pleurisy. There has been no purulence, fever, or rash. She isn't quite a bit of magnesium, but does not have diarrhea. She hasn't occasionally soft bowel movement. She has sinus headaches and some headaches secondary to it to tight wig but she has taking care of those issues. There has been no nausea or vomiting. Her appetite  is just not there, but she is holding her weight. She is planning a trip to [place] to hear her children saying in a choir at [facility] and she has  been advised to drive rather than fly. She was having some more bony aches and pains, which she attributes to not moving as much as before, and she took 10 mg of OxyContin last night which was very helpful. She was able to sleep much better. Aside from these issues a detailed review of systems today was stable  PAST MEDICAL HISTORY: Past Medical History  Diagnosis Date  . Complication of anesthesia     her father and uncle both had very difficult time waking up-was  something they told them may be hereditory. she will find out.  [name] Hypothyroidism   . 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 Radiation 01/21/13-03/06/13    Right Breast  . Breast cancer ([name]) dx'd 04-10-12-rt    PAST SURGICAL HISTORY: Past Surgical History  Procedure Laterality Date  . Wisdom tooth extraction    . Portacath placement  04/22/2012    Procedure: INSERTION PORT-A-CATH;  Surgeon: [name], MD;  Location: [place];  Service: General;  Laterality: Left;  [name] Modified mastectomy Right 11/18/2012    Procedure:  RIGHT MODIFIED MASTECTOMY;  Surgeon: [name], MD;  Location: [facility];  Service: General;  Laterality: Right;  . Knee arthroscopy Right 11/26/2013  . Port-a-cath removal Left 12/10/2013    Procedure: MINOR REMOVAL PORT-A-CATH;  Surgeon: [name], MD;  Location: [facility] [place] [facility];  Service: General;  Laterality: Left;    FAMILY HISTORY Family History  Problem Relation Age of Onset  . Lung cancer Maternal Grandfather 66    smoker  . Prostate cancer Maternal Grandfather 90  . Throat cancer Other     Great Aunt x 2  . Liver cancer Other     Maternal Great Grandmother  . Melanoma Maternal Uncle 81  . Brain cancer Cousin 11    non-malignant  . Allergic rhinitis Neg Hx   . Asthma Neg Hx   . Eczema Neg Hx   . Urticaria Neg Hx   the patient's parents are living, in their early 7s. The patient has 2 brothers, no sisters. The patient's mother was  diagnosed with breast cancer, HER-2 positive, in 2014 in [place].  GYNECOLOGIC HISTORY:   (Updated 10/03/2013) Menarche age 19, first live birth age 58. She is GX P4. LMP January 2014. 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  The children are currently ages 79, 67, 46, and 29. Her husband, [name], is a Customer service manager.He just started a job for [employer].  They attend the [employer] DIRECTIVES: The patient's husband is her HCPOA   HEALTH MAINTENANCE:  (Updated 10/03/2013) Social History  Substance Use Topics  . Smoking status: Never Smoker   . Smokeless tobacco: Never Used  . Alcohol Use: No     Colonoscopy: Never  PAP: November 2013/Dr. Smith  Bone density: January 2015, [facility], normal  Lipid panel:  Not on file  Allergies  Allergen Reactions  . Almond Meal Rash  . Almond Oil Rash  . Other Rash    LOTIONS-unknown type  . Tegaderm Ag Mesh [Silver] Rash  . Vancomycin Rash    Red Man Syndrome  Current Outpatient Prescriptions  Medication Sig Dispense Refill  . cholecalciferol (VITAMIN D) 1000 UNITS tablet Take 2,000 Units by mouth 2 (two) times daily.     [name] dextromethorphan-guaiFENesin (MUCINEX DM) 30-600 MG 12hr tablet Take 1 tablet by mouth 2 (two) times daily as needed for cough. Reported on 08/10/2015    . levofloxacin (LEVAQUIN) 500 MG tablet Take 1 tablet (500 mg total) by mouth daily. (Patient not taking: Reported on 08/10/2015) 7 tablet 0  . levothyroxine (SYNTHROID, LEVOTHROID) 175 MCG tablet Take 175 mcg by mouth daily before breakfast.    . Loratadine-Pseudoephedrine (CLARITIN-D 24 HOUR PO) Take 1 tablet by mouth every morning. Reported on 08/10/2015    . LORazepam (ATIVAN) 0.5 MG tablet Take 1 tablet (0.5 mg total) by mouth at bedtime as needed (Nausea or vomiting). 60 tablet 2  . magnesium oxide (MAG-OX) 400 MG tablet Take 600 mg by mouth  daily. Pt takes 2 capsules  by mouth 3 times daily.    [name] oxyCODONE (OXY IR/ROXICODONE) 5 MG immediate release tablet Take 1 tablet (5 mg total) by mouth every 6 (six) hours as needed for severe pain. 30 tablet 0  . oxyCODONE (OXYCONTIN) 10 mg 12 hr tablet Take 1 tablet (10 mg total) by mouth every 12 (twelve) hours. 60 tablet 0  . pantoprazole (PROTONIX) 40 MG tablet Take 1 tablet (40 mg total) by mouth daily at 12 noon. 30 tablet 2  . Saline 0.2 % GEL Place 1 Squirt into the nose at bedtime.    . sodium chloride (OCEAN) 0.65 % SOLN nasal spray Place 1 spray into both nostrils QID.     No current facility-administered medications for this visit.      OBJECTIVE: Middle-aged white woman who appears chronically ill; she is wearing a nasal cannula connected to portable oxygen  Sclerae unicteric, pupils round and equal Oropharynx clear and moist-- no thrush or other lesions No cervical or supraclavicular adenopathy Lungs no rales or rhonchi Heart regular rate and rhythm Abd soft, nontender, positive bowel sounds MSK no focal spinal tenderness, chronic grade 1 right upper extremity lymphedema Neuro: nonfocal, well oriented, appropriate affect Breasts: deferred    Filed Vitals:   08/11/15 1124  BP: 100/69  Pulse: 116  Temp: 97.3 F (36.3 C)  Resp: 18     Body mass index is 20.18 kg/(m^2).    ECOG FS:1 - Symptomatic but completely ambulatory Filed Weights   08/11/15 1124  Weight: 128 lb 14.4 oz (58.469 kg)     LAB RESULTS:   Lab Results  Component Value Date   WBC 7.4 08/10/2015   NEUTROABS 6.3 08/10/2015   HGB 10.3* 08/10/2015   HCT 31.1* 08/10/2015   MCV 105.3* 08/10/2015   PLT 323 08/10/2015      Chemistry      Component Value Date/Time   NA 138 08/10/2015 1428   NA 137 04/21/2015 0546   K 3.7 08/10/2015 1428   K 3.5 04/21/2015 0546   CL 101 04/21/2015 0546   CL 101 10/18/2012 0859   CO2 34* 08/10/2015 1428   CO2 28 04/21/2015 0546   BUN 13.5 08/10/2015 1428    BUN 10 04/21/2015 0546   CREATININE 0.6 08/10/2015 1428   CREATININE 0.48 04/21/2015 0546      Component Value Date/Time   CALCIUM 9.4 08/10/2015 1428   CALCIUM 8.3* 04/21/2015 0546   ALKPHOS 105 08/10/2015 1428   ALKPHOS 94 04/21/2015 0546   AST 17 08/10/2015 1428   AST 17  04/21/2015 0546   ALT <9 08/10/2015 1428   ALT 10* 04/21/2015 0546   BILITOT 0.59 08/10/2015 1428   BILITOT 0.6 04/21/2015 0546       STUDIES: Dg Chest 1 View  08/09/2015  CLINICAL DATA:  Post right thoracentesis, history of breast cancer EXAM: CHEST 1 VIEW COMPARISON:  08/03/2015 FINDINGS: Cardiomediastinal silhouette is stable. Moderate size right pleural effusion again noted. Surgical clips in right axilla. There is no pneumothorax. Small left pleural effusion. Small left basilar atelectasis. Stable atelectasis in right lower lobe. Left IJ Port-A-Cath with tip in SVC is unchanged in position. IMPRESSION: Moderate size right pleural effusion again noted. Surgical clips in right axilla. There is no pneumothorax. Small left pleural effusion. Small left basilar atelectasis. Stable atelectasis in right lower lobe. Left IJ Port-A-Cath with tip in SVC is unchanged in position. Electronically Signed   By: Lahoma Crocker M.D.   On: 08/09/2015 12:45   Dg Chest 1 View  08/03/2015  CLINICAL DATA:  Left-sided thoracentesis, evaluate for pneumothorax EXAM: CHEST 1 VIEW COMPARISON:  CT chest of 08/02/2015 FINDINGS: Much of the left pleural effusion has been evacuated after left thoracentesis. Only a tiny left pleural effusion remains with left basilar atelectasis. No pneumothorax is seen. A large right pleural effusion remains with considerable volume loss throughout the right lung. A left-sided Port-A-Cath is noted with the tip seen to overlie the mid SVC. Heart size is stable. Surgical clips overlie the right axilla from prior right mastectomy. IMPRESSION: 1. New pneumothorax after removal of much of the left pleural effusion by left  thoracentesis. 2. No change in large right pleural effusion with considerable volume loss of the right lung. Electronically Signed   By: Ivar Drape M.D.   On: 08/03/2015 13:03   Dg Chest 2 View  08/02/2015  CLINICAL DATA:  Breast cancer, increased shortness of breath for 4 days, cough, has had thoracentesis performed several times before on RIGHT EXAM: CHEST  2 VIEW COMPARISON:  CT chest 07/14/2015 FINDINGS: LEFT jugular Port-A-Cath with tip projecting over SVC. Stable heart size and mediastinal contours. Large RIGHT and small LEFT pleural effusions, increased on LEFT and slightly increased on RIGHT. Significant atelectasis in lower lungs particularly on RIGHT. No pneumothorax. Post RIGHT mastectomy and axillary node dissection. No acute osseous lesions. IMPRESSION: Increased BILATERAL pleural effusions and basilar atelectasis since prior exam. Electronically Signed   By: Lavonia Dana M.D.   On: 08/02/2015 12:53   Ct Chest W Contrast  07/14/2015  CLINICAL DATA:  Restaging breast cancer diagnosed in 2013 with metastatic disease to bone, liver and right lung. Chemotherapy ongoing. Recist protocol patient. EXAM: CT CHEST, ABDOMEN, AND PELVIS WITH CONTRAST TECHNIQUE: Multidetector CT imaging of the chest, abdomen and pelvis was performed following the standard protocol during bolus administration of intravenous contrast. CONTRAST:  134m OMNIPAQUE IOHEXOL 300 MG/ML  SOLN COMPARISON:  Prior CTs 04/19/2015.  PET-CT 03/17/2015. FINDINGS: RECIST 1.1 Target Lesions: 1. Right hilar node measures 8 mm on image 25 (previously 12 mm). 2. Subcarinal node measures 10 mm on image 29 (previously 10 mm). 3. Right retrocrural node measures 10 mm on image 54 (previously 13 mm). 4. Central hepatic lesion measures 2.4 cm on image 5 of series 4 (previously 2.1 cm). 5. Right adrenal lesion measures 2.2 cm on image 58 (previously 3.1 cm). Non-target Lesions: 1. Right pleural disease, subjectively improved, although difficult to  measure. 2. Right lung airspace disease, subjectively improved. 3. Other hepatic metastases, mixed response. CT CHEST Mediastinum/Nodes:  Previously demonstrated hypermetabolic right supraclavicular lymph node is incompletely visualized, although may be slightly smaller, measuring 6 mm on image 1. There are no enlarged axillary or internal mammary lymph nodes. There are multiple small mediastinal and hilar lymph nodes which have improved compared with the prior study. These are difficult to accurately measure given their confluent nature. A right hilar node which previously measured 12 mm now measures 8 mm on image 25. A subcarinal node which previously measured 10 mm measures 10 mm on image 29. No progressive adenopathy. The heart size is stable. There is a stable small pericardial effusion. Left IJ Port-A-Cath extends to the cavoatrial junction. Lungs/Pleura: The complex loculated right pleural effusion has mildly decreased in volume status post interval thoracentesis. There is still diffuse right pleural thickening and enhancement, although the focal nodularity previously noted at the posterior costophrenic angle has improved. Again, this is not easily measurable given its irregular shape. A small amount of simple dependent pleural fluid on the left appears unchanged. There has been interval improved aeration of the right middle and lower lobes with persistent volume loss, air bronchograms and septal thickening. No measurable lesion is seen within the right middle lobe. No suspicious findings in the left lung. There is a stable tiny left upper lobe nodule measuring 3 mm on image 20. There is stable dependent left lower lobe atelectasis. Musculoskeletal/Chest wall: Right mastectomy and axillary node dissection. No evidence of chest wall recurrence or suspicious osseous finding. The T9 superior endplate compression deformity is unchanged. CT ABDOMEN AND PELVIS FINDINGS Hepatobiliary: The early images through the  liver were obtained prior to opacification of the hepatic veins, limiting parenchymal assessment. The individual lesions are probably best measured on the delayed images (series 5). The largest lesion centrally in the liver measures 2.4 x 1.7 cm on image number 5 (previously 2.1 x 1.4 cm). The other smaller lesions demonstrate a mixed response. For example, the previously referenced lesions appear smaller, including a right lobe lesion (9 mm on image 12, previously 15 mm) and the left lobe lesion on image number 4 (6 mm, previously 10 mm). Other lesions appear larger, including a 10 mm lesion in segment 7 on image 5. The gallbladder appears normal. There is no biliary dilatation. Pancreas: Unremarkable. No pancreatic ductal dilatation or surrounding inflammatory changes. Spleen: Normal in size without focal abnormality. Adrenals/Urinary Tract: The right adrenal lesion appears smaller, measuring 2.2 x 1.2 cm on image 58 (previously 3.1 x 1.3 cm. The left adrenal gland appears normal. The kidneys appear normal without evidence of urinary tract calculus, suspicious lesion or hydronephrosis. No bladder abnormalities are seen. Stomach/Bowel: No evidence of bowel wall thickening, distention or surrounding inflammatory change. The pneumatosis and free air demonstrated previously have resolved. Vascular/Lymphatic: Right retrocrural node measures 10 mm on image 54 (previously 13 mm). No other enlarged abdominal or pelvic lymph nodes are seen. Stable atherosclerosis of the aorta, its branches and the iliac arteries. Reproductive: The uterus and ovaries appear unremarkable. No adnexal mass. Other: No ascites or peritoneal nodularity. Musculoskeletal: No acute or significant osseous findings. IMPRESSION: 1. Interval improvement in mediastinal lymphadenopathy and nodular pleural disease on the right. Residual disease is difficult to measure given its confluent nature. Moderate size complex right pleural effusion has mildly  decreased in volume. 2. Interval improved right lung airspace disease. Remaining density may reflect combination of lymphangitic tumor, atelectasis and postobstructive pneumonitis. 3. Mixed response of hepatic metastatic disease. 4. The right adrenal metastasis is slightly smaller. 5. Interval resolution  of pneumoperitoneum and pneumatosis. Electronically Signed   By: Richardean Sale M.D.   On: 07/14/2015 15:40   Ct Angio Chest Pe W/cm &/or Wo Cm  08/02/2015  CLINICAL DATA:  Shortness of breath and fatigued with hypoxia. Upper and lower extremity swelling right greater than left. History of metastatic breast cancer. EXAM: CT ANGIOGRAPHY CHEST WITH CONTRAST TECHNIQUE: Multidetector CT imaging of the chest was performed using the standard protocol during bolus administration of intravenous contrast. Multiplanar CT image reconstructions and MIPs were obtained to evaluate the vascular anatomy. CONTRAST:  65.63m OMNIPAQUE IOHEXOL 350 MG/ML SOLN COMPARISON:  07/14/2015 FINDINGS: Left-sided Port-A-Cath with tip in the SVC. Exam demonstrates slightly more pronounced 9 mm sub solid nodular opacity over the right upper lobe. Slight interval worsening of a moderate size complex right pleural effusion with diffuse pleural thickening. Slight interval worsening consolidation over the right midlung and right lower lobe likely atelectatic change although cannot exclude concomitant infection. Interval worsening of moderate size left pleural effusion with associated atelectasis in the left base. Stable subpleural 2 mm nodule over the lingula. No evidence of pulmonary embolism. Slight interval worsening of small to moderate pericardial effusion. Stable mediastinal lymph nodes not as well defined on the current exam. Stable small nodes over the neck base/ supraclavicular regions. Remainder of the mediastinal unchanged. Limited images through the upper abdomen demonstrate evidence of known liver metastases without significant change.  Slight increase in size of known adrenal metastasis measuring 1.4 x 3.2 cm (previously 1.2 x 2.2 cm). Sub cm hypervascular nodule over the spleen. Stable likely pathologic mild compression fracture of T10. Stable sclerotic focus of the base of the T1 spinous process. Review of the MIP images confirms the above findings. IMPRESSION: No evidence of pulmonary embolism. Interval worsening of moderate size complex right pleural effusion with associated worsening consolidation over the right midlung and right lower lobe likely atelectasis although cannot exclude concomitant infection. Stable diffuse right pleural thickening as well as stable small nodes over the neck base and mediastinum in this patient with known metastatic breast cancer. Interval worsening moderate size left pleural effusion with associated basilar atelectasis. Stable known osseous, adrenal and liver metastases. Sub cm hyperdense nodule over the spleen as cannot exclude underlying metastatic lesion. Recommend attention on follow-up. Electronically Signed   By: DMarin OlpM.D.   On: 08/02/2015 16:11   Nm Bone Scan Whole Body  07/22/2015  CLINICAL DATA:  Breast malignancy, right groin pain, no history of trauma EXAM: NUCLEAR MEDICINE WHOLE BODY BONE SCAN TECHNIQUE: Whole body anterior and posterior images were obtained approximately 3 hours after intravenous injection of radiopharmaceutical. RADIOPHARMACEUTICALS:  26.5 mCi Technetium-945mDP IV COMPARISON:  PET-CT study of March 17, 2015 FINDINGS: There is adequate uptake of the radiopharmaceutical by the skeleton. There is adequate soft tissue clearance and renal activity. Uptake within the calvarium is normal. Uptake within the cervical spine reveals a focus posterior likely within the spinous process of T7. There is a small focus of increased uptake in the pedicle of L1. There is increased uptake within the left aspect of the manubrium and the lowermost sternal segment. There is asymmetrically  increased uptake in the humeral head on the left as compared to the right. There is mildly increased uptake in the roof of the right acetabulum and in the right femoral head. Activity more distally in the lower extremities is normal with exception of a tiny focus of uptake associated with the right knee. IMPRESSION: 1. Abnormally increased uptake within the  left humeral head, the right femoral head, the roof of the right acetabulum is suspicious for metastatic disease. Plain film evaluation of these regions is recommended. 2. Activity within the spine and sternum is most likely degenerative in nature. Electronically Signed   By: David  Martinique M.D.   On: 07/22/2015 13:30   Ct Abdomen Pelvis W Contrast  07/14/2015  CLINICAL DATA:  Restaging breast cancer diagnosed in 2013 with metastatic disease to bone, liver and right lung. Chemotherapy ongoing. Recist protocol patient. EXAM: CT CHEST, ABDOMEN, AND PELVIS WITH CONTRAST TECHNIQUE: Multidetector CT imaging of the chest, abdomen and pelvis was performed following the standard protocol during bolus administration of intravenous contrast. CONTRAST:  177m OMNIPAQUE IOHEXOL 300 MG/ML  SOLN COMPARISON:  Prior CTs 04/19/2015.  PET-CT 03/17/2015. FINDINGS: RECIST 1.1 Target Lesions: 1. Right hilar node measures 8 mm on image 25 (previously 12 mm). 2. Subcarinal node measures 10 mm on image 29 (previously 10 mm). 3. Right retrocrural node measures 10 mm on image 54 (previously 13 mm). 4. Central hepatic lesion measures 2.4 cm on image 5 of series 4 (previously 2.1 cm). 5. Right adrenal lesion measures 2.2 cm on image 58 (previously 3.1 cm). Non-target Lesions: 1. Right pleural disease, subjectively improved, although difficult to measure. 2. Right lung airspace disease, subjectively improved. 3. Other hepatic metastases, mixed response. CT CHEST Mediastinum/Nodes: Previously demonstrated hypermetabolic right supraclavicular lymph node is incompletely visualized, although  may be slightly smaller, measuring 6 mm on image 1. There are no enlarged axillary or internal mammary lymph nodes. There are multiple small mediastinal and hilar lymph nodes which have improved compared with the prior study. These are difficult to accurately measure given their confluent nature. A right hilar node which previously measured 12 mm now measures 8 mm on image 25. A subcarinal node which previously measured 10 mm measures 10 mm on image 29. No progressive adenopathy. The heart size is stable. There is a stable small pericardial effusion. Left IJ Port-A-Cath extends to the cavoatrial junction. Lungs/Pleura: The complex loculated right pleural effusion has mildly decreased in volume status post interval thoracentesis. There is still diffuse right pleural thickening and enhancement, although the focal nodularity previously noted at the posterior costophrenic angle has improved. Again, this is not easily measurable given its irregular shape. A small amount of simple dependent pleural fluid on the left appears unchanged. There has been interval improved aeration of the right middle and lower lobes with persistent volume loss, air bronchograms and septal thickening. No measurable lesion is seen within the right middle lobe. No suspicious findings in the left lung. There is a stable tiny left upper lobe nodule measuring 3 mm on image 20. There is stable dependent left lower lobe atelectasis. Musculoskeletal/Chest wall: Right mastectomy and axillary node dissection. No evidence of chest wall recurrence or suspicious osseous finding. The T9 superior endplate compression deformity is unchanged. CT ABDOMEN AND PELVIS FINDINGS Hepatobiliary: The early images through the liver were obtained prior to opacification of the hepatic veins, limiting parenchymal assessment. The individual lesions are probably best measured on the delayed images (series 5). The largest lesion centrally in the liver measures 2.4 x 1.7 cm on  image number 5 (previously 2.1 x 1.4 cm). The other smaller lesions demonstrate a mixed response. For example, the previously referenced lesions appear smaller, including a right lobe lesion (9 mm on image 12, previously 15 mm) and the left lobe lesion on image number 4 (6 mm, previously 10 mm). Other lesions appear larger,  including a 10 mm lesion in segment 7 on image 5. The gallbladder appears normal. There is no biliary dilatation. Pancreas: Unremarkable. No pancreatic ductal dilatation or surrounding inflammatory changes. Spleen: Normal in size without focal abnormality. Adrenals/Urinary Tract: The right adrenal lesion appears smaller, measuring 2.2 x 1.2 cm on image 58 (previously 3.1 x 1.3 cm. The left adrenal gland appears normal. The kidneys appear normal without evidence of urinary tract calculus, suspicious lesion or hydronephrosis. No bladder abnormalities are seen. Stomach/Bowel: No evidence of bowel wall thickening, distention or surrounding inflammatory change. The pneumatosis and free air demonstrated previously have resolved. Vascular/Lymphatic: Right retrocrural node measures 10 mm on image 54 (previously 13 mm). No other enlarged abdominal or pelvic lymph nodes are seen. Stable atherosclerosis of the aorta, its branches and the iliac arteries. Reproductive: The uterus and ovaries appear unremarkable. No adnexal mass. Other: No ascites or peritoneal nodularity. Musculoskeletal: No acute or significant osseous findings. IMPRESSION: 1. Interval improvement in mediastinal lymphadenopathy and nodular pleural disease on the right. Residual disease is difficult to measure given its confluent nature. Moderate size complex right pleural effusion has mildly decreased in volume. 2. Interval improved right lung airspace disease. Remaining density may reflect combination of lymphangitic tumor, atelectasis and postobstructive pneumonitis. 3. Mixed response of hepatic metastatic disease. 4. The right adrenal  metastasis is slightly smaller. 5. Interval resolution of pneumoperitoneum and pneumatosis. Electronically Signed   By: Richardean Sale M.D.   On: 07/14/2015 15:40   US Thoracentesis Asp Pleural Space W/img Guide  08/09/2015  INDICATION: Breast cancer, dyspnea, malignant right pleural effusion. Request made for therapeutic right thoracentesis. EXAM: ULTRASOUND GUIDED THERAPEUTIC RIGHT THORACENTESIS MEDICATIONS: None. COMPLICATIONS: None immediate. PROCEDURE: An ultrasound guided thoracentesis was thoroughly discussed with the patient and questions answered. The benefits, risks, alternatives and complications were also discussed. The patient understands and wishes to proceed with the procedure. Written consent was obtained. Ultrasound was performed to localize and mark an adequate pocket of fluid in the right chest. The area was then prepped and draped in the normal sterile fashion. 1% Lidocaine was used for local anesthesia. Under ultrasound guidance a Safe-T-Centesis catheter was introduced. Thoracentesis was performed. The catheter was removed and a dressing applied. FINDINGS: A total of approximately 500 cc of amber fluid was removed. The pleural fluid collection was multi-septated. Only the above amount of fluid was removed at this time secondary to persistent patient coughing and chest discomfort. IMPRESSION: Successful ultrasound guided therapeutic right thoracentesis yielding 500 cc of pleural fluid. Read by: Rowe Robert, PA-C Electronically Signed   By: Corrie Mckusick D.O.   On: 08/09/2015 12:21   US Thoracentesis Asp Pleural Space W/img Guide  08/03/2015  INDICATION: Breast cancer with recurrent pleural effusions. History of multiple right-sided thoracentesis. Patient presents today for thoracentesis. EXAM: ULTRASOUND GUIDED DIAGNOSTIC AND THERAPEUTIC THORACENTESIS MEDICATIONS: 1% lidocaine COMPLICATIONS: None immediate. PROCEDURE: An ultrasound guided thoracentesis was thoroughly discussed with the  patient and questions answered. The benefits, risks, alternatives and complications were also discussed. The patient understands and wishes to proceed with the procedure. Written consent was obtained. Ultrasound was performed to localize and mark an adequate pocket of fluid in the left chest. Fluid was noted on the right and left side. The right side of her chest had loculated fluid. After a discussion with Dr. Laurence Ferrari, it was felt that a thoracentesis of the left side would be more beneficial to the patient as we would likely remove more fluid to help with her shortness of breath.  The left chest was then prepped and draped in the normal sterile fashion. 1% Lidocaine was used for local anesthesia. A Safe-T-Centesis catheter was introduced. Thoracentesis was performed. The catheter was removed and a dressing applied. FINDINGS: A total of approximately 0.9 L of somewhat cloudy yellow fluid was removed. Samples were sent to the laboratory as requested by the clinical team. IMPRESSION: Successful ultrasound guided left thoracentesis yielding 0.9 L of pleural fluid. Read by: Saverio Danker, PA-C Electronically Signed   By: Jacqulynn Cadet M.D.   On: 08/03/2015 13:22    ASSESSMENT: 50 y.o. BRCA negative [place] woman with triple-negative stage IV breast cancer  (1) an status post right breast upper outer quadrant and right axillary lymph node biopsy 04/04/2012, both positive for an invasive ductal carcinoma, grade 3, triple negative, with an MIB-1 of 25%  (2) Treated neoadjuvantly with  (a) fluorouracil, cyclophosphamide, and epirubicin (at 100 mg/M2) x4 completed 06/07/2012  (b) docetaxel (75 mg/M2) for one dose, 06/21/2012, poorly tolerated  (c) carboplatin and gemcitabine given every 21 days for 6 cycles completed 10/11/2012  (3) status post right modified radical mastectomy 11/18/2012 showing a complete pathologic response--all 16 lymph nodes were benign  (a) no plans for reconstructon  (4)  adjuvant radiation therapy completed 03/06/2013  METASTATIC DISEASE June 2016 (5) 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, 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  (6) staging PET scan 11/06/2014 showed significant disease in the middle and lower lobes of the Right lung, Right effusion, multiple liver and bone lesions, as well as left lung nodules, a Right adrenal nodule, and widespread hypermetabolic adenopathy  (a) brain MRI 11/16/2014 showed multiple cerebellar lesions  (7) RADIATION TREATMENTS thoracic metastases  (a) 01/28/2015-02/11/2015: Thoracic Spine T8-10, 30 Gy in 10 fractions  (7) abraxane day 1 and day 8 of each 21 day cycle started 11/09/2014: last dose 03/12/2015 (6 cycles)  (a) PET scan 01/05/2015 shows an excellent initial response after 3 cycles  (b) PET scan 03/17/2015 shows progression  (8) zolendronate started 11/16/2014, to be repeated every 12 weeks, most recent dose 05/28/2015  (9) Foundation 1 study sent  11/06/2014: patient's sister in law Garvin Fila following [phone])  (a) 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[place], NTRK1, MYC, ARID1A and LYN, none w approved therapies  (b) pazopanib, ponatinib or crizotinib suggested as possible off-protocol options  (10) RADIATION TREATMENTS: Brain metastases:  (a) s/p SRS therapy 12/11/2014 to 4 right cerebellar lesions  (b) multiple (18+) lesions noted on repeat brain MRI 03/16/2015  (c) whole brain irradiation completed 04/12/2015  (d) repeat brain MRI for every 03/10/2016 shows resolution of the earlier lesions.  (11) uncontrolled pain: Started on OxyContin 10 mg twice a day with oxycodone 5 mg as needed 03/26/2015  (12) bilateral pleural effusions  (a) s/p rightthoracentesis July 2016 (x2), 03/30/2015  (b) s/p left thoracentesis 08/02/2015  (13) started eribulin11/29/2016, originally planned to be given day one and day 8 of each 21 day cycle, but switched to every 14 days with onpro support because of  persistent leukopenia. Discontinued 05/25/14, after 2 cycles, w no evidence of response  (14) cisplatin started 05/28/2015, repeated every 21 days. Last dose given 07/08/15. Discontinued due to low counts.   (15) [facility] study protocol [facility] 7412 started 07/28/15. Consists of pembrolizumab every 21 days. Cyclophosphamide given as a "primer" on day 1, cycle 1 only.   PLAN: It is hard for me to believe that Chrishawn's severe fatigue is due to the pembrolizumab. The problem started the very next day and  has not gotten any better. In fact her shortness of breath started the same night that she was treated. To me this sounds more like a coincidence in time with a process that was already developing  Independently of this treatment.   think the main reason for her fatigue may be the fluid accumulation in her lungs. This means every time she takes of breath she has to mobilize several pounds of fluid. Her left thoracentesis did not help significantly, but perhaps if we do the right side she will do better and that is scheduled for next week.  I also don't think the fatigue is going to be due to her moderate hypothyroidism. In any case this is being corrected.  I am more concerned that the fatigue might be due to adrenal insufficiency,  although her blood pressure is completely stable as are her labs. Nevertheless I'm drawing a cortisol and ACTH today.  We are continuing to supplement her magnesium. I have suggested since she is having a bit more bony pain that she take OxyContin 10 mg twice a day. This will help with the loose bowel movements the magnesium is starting to cause her. Otherwise she will continue on 2 magnesium tablets 3 times a day as before. I gave her prescriptions for the OxyContin and OxyIR today.  She will be traveling to [place] by car leaving tomorrow  and returning 08/16/2015. I have made her an appointment with [place] on the 28th just to reassess how she is doing and to proceed with anythng we  need to do not so she can proceed to her second dose of pembrolizumab 08/19/2015, when she also has an appointment.  Sydny knows to call for any problems that may develop before her next visit here.  Chauncey Cruel, MD  08/11/2015

## 2015-08-12 ENCOUNTER — Inpatient Hospital Stay (HOSPITAL_COMMUNITY)
Admission: EM | Admit: 2015-08-12 | Discharge: 2015-08-18 | DRG: 054 | Disposition: A | Discharge status: Hospice | Payer: BLUE CROSS/BLUE SHIELD | Source: Intra-hospital | Attending: Neurology | Admitting: Neurology

## 2015-08-12 ENCOUNTER — Inpatient Hospital Stay (HOSPITAL_COMMUNITY): Payer: BLUE CROSS/BLUE SHIELD

## 2015-08-12 ENCOUNTER — Emergency Department (HOSPITAL_COMMUNITY): Payer: BLUE CROSS/BLUE SHIELD

## 2015-08-12 ENCOUNTER — Encounter (HOSPITAL_COMMUNITY): Payer: Self-pay | Admitting: Emergency Medicine

## 2015-08-12 ENCOUNTER — Other Ambulatory Visit: Payer: Self-pay

## 2015-08-12 DIAGNOSIS — C50919 Malignant neoplasm of unspecified site of unspecified female breast: Secondary | ICD-10-CM | POA: Diagnosis not present

## 2015-08-12 DIAGNOSIS — Z9011 Acquired absence of right breast and nipple: Secondary | ICD-10-CM | POA: Diagnosis not present

## 2015-08-12 DIAGNOSIS — R0600 Dyspnea, unspecified: Secondary | ICD-10-CM

## 2015-08-12 DIAGNOSIS — R4701 Aphasia: Secondary | ICD-10-CM | POA: Diagnosis present

## 2015-08-12 DIAGNOSIS — C7931 Secondary malignant neoplasm of brain: Secondary | ICD-10-CM | POA: Diagnosis present

## 2015-08-12 DIAGNOSIS — Z808 Family history of malignant neoplasm of other organs or systems: Secondary | ICD-10-CM | POA: Diagnosis not present

## 2015-08-12 DIAGNOSIS — C7949 Secondary malignant neoplasm of other parts of nervous system: Secondary | ICD-10-CM | POA: Diagnosis present

## 2015-08-12 DIAGNOSIS — Z66 Do not resuscitate: Secondary | ICD-10-CM | POA: Diagnosis present

## 2015-08-12 DIAGNOSIS — Z9221 Personal history of antineoplastic chemotherapy: Secondary | ICD-10-CM | POA: Diagnosis not present

## 2015-08-12 DIAGNOSIS — J910 Malignant pleural effusion: Secondary | ICD-10-CM | POA: Diagnosis present

## 2015-08-12 DIAGNOSIS — C50411 Malignant neoplasm of upper-outer quadrant of right female breast: Secondary | ICD-10-CM | POA: Diagnosis not present

## 2015-08-12 DIAGNOSIS — D649 Anemia, unspecified: Secondary | ICD-10-CM | POA: Diagnosis present

## 2015-08-12 DIAGNOSIS — C7932 Secondary malignant neoplasm of cerebral meninges: Secondary | ICD-10-CM | POA: Diagnosis present

## 2015-08-12 DIAGNOSIS — E876 Hypokalemia: Secondary | ICD-10-CM | POA: Diagnosis present

## 2015-08-12 DIAGNOSIS — R4182 Altered mental status, unspecified: Secondary | ICD-10-CM

## 2015-08-12 DIAGNOSIS — E785 Hyperlipidemia, unspecified: Secondary | ICD-10-CM | POA: Diagnosis present

## 2015-08-12 DIAGNOSIS — I639 Cerebral infarction, unspecified: Secondary | ICD-10-CM | POA: Diagnosis not present

## 2015-08-12 DIAGNOSIS — Z6820 Body mass index (BMI) 20.0-20.9, adult: Secondary | ICD-10-CM

## 2015-08-12 DIAGNOSIS — E43 Unspecified severe protein-calorie malnutrition: Secondary | ICD-10-CM | POA: Diagnosis present

## 2015-08-12 DIAGNOSIS — C7951 Secondary malignant neoplasm of bone: Secondary | ICD-10-CM | POA: Diagnosis present

## 2015-08-12 DIAGNOSIS — Z801 Family history of malignant neoplasm of trachea, bronchus and lung: Secondary | ICD-10-CM | POA: Diagnosis not present

## 2015-08-12 DIAGNOSIS — Z800 Family history of malignant neoplasm of digestive organs: Secondary | ICD-10-CM

## 2015-08-12 DIAGNOSIS — R569 Unspecified convulsions: Secondary | ICD-10-CM | POA: Diagnosis present

## 2015-08-12 DIAGNOSIS — C799 Secondary malignant neoplasm of unspecified site: Secondary | ICD-10-CM | POA: Insufficient documentation

## 2015-08-12 DIAGNOSIS — Z923 Personal history of irradiation: Secondary | ICD-10-CM | POA: Diagnosis not present

## 2015-08-12 DIAGNOSIS — E039 Hypothyroidism, unspecified: Secondary | ICD-10-CM | POA: Diagnosis present

## 2015-08-12 DIAGNOSIS — J90 Pleural effusion, not elsewhere classified: Secondary | ICD-10-CM | POA: Insufficient documentation

## 2015-08-12 DIAGNOSIS — R471 Dysarthria and anarthria: Secondary | ICD-10-CM | POA: Diagnosis present

## 2015-08-12 DIAGNOSIS — Z853 Personal history of malignant neoplasm of breast: Secondary | ICD-10-CM | POA: Diagnosis not present

## 2015-08-12 DIAGNOSIS — R9402 Abnormal brain scan: Secondary | ICD-10-CM | POA: Diagnosis not present

## 2015-08-12 DIAGNOSIS — C778 Secondary and unspecified malignant neoplasm of lymph nodes of multiple regions: Secondary | ICD-10-CM | POA: Diagnosis not present

## 2015-08-12 DIAGNOSIS — Z515 Encounter for palliative care: Secondary | ICD-10-CM | POA: Diagnosis not present

## 2015-08-12 DIAGNOSIS — Z7189 Other specified counseling: Secondary | ICD-10-CM | POA: Insufficient documentation

## 2015-08-12 DIAGNOSIS — R410 Disorientation, unspecified: Secondary | ICD-10-CM | POA: Diagnosis not present

## 2015-08-12 DIAGNOSIS — C7801 Secondary malignant neoplasm of right lung: Secondary | ICD-10-CM | POA: Diagnosis not present

## 2015-08-12 DIAGNOSIS — R9089 Other abnormal findings on diagnostic imaging of central nervous system: Secondary | ICD-10-CM

## 2015-08-12 DIAGNOSIS — Z9689 Presence of other specified functional implants: Secondary | ICD-10-CM

## 2015-08-12 DIAGNOSIS — J948 Other specified pleural conditions: Secondary | ICD-10-CM | POA: Diagnosis not present

## 2015-08-12 LAB — CBC
HEMATOCRIT: 31.8 % — AB (ref 36.0–46.0)
Hemoglobin: 10.3 g/dL — ABNORMAL LOW (ref 12.0–15.0)
MCH: 34 pg (ref 26.0–34.0)
MCHC: 32.4 g/dL (ref 30.0–36.0)
MCV: 105 fL — AB (ref 78.0–100.0)
Platelets: 264 10*3/uL (ref 150–400)
RBC: 3.03 MIL/uL — ABNORMAL LOW (ref 3.87–5.11)
RDW: 18.6 % — AB (ref 11.5–15.5)
WBC: 7.2 10*3/uL (ref 4.0–10.5)

## 2015-08-12 LAB — URINALYSIS, ROUTINE W REFLEX MICROSCOPIC
Bilirubin Urine: NEGATIVE
Glucose, UA: NEGATIVE mg/dL
Ketones, ur: 15 mg/dL — AB
Nitrite: NEGATIVE
Protein, ur: NEGATIVE mg/dL
SPECIFIC GRAVITY, URINE: 1.021 (ref 1.005–1.030)
pH: 6 (ref 5.0–8.0)

## 2015-08-12 LAB — I-STAT CHEM 8, ED
BUN: 16 mg/dL (ref 6–20)
CALCIUM ION: 1.07 mmol/L — AB (ref 1.12–1.23)
Chloride: 88 mmol/L — ABNORMAL LOW (ref 101–111)
Creatinine, Ser: 0.5 mg/dL (ref 0.44–1.00)
GLUCOSE: 103 mg/dL — AB (ref 65–99)
HCT: 33 % — ABNORMAL LOW (ref 36.0–46.0)
HEMOGLOBIN: 11.2 g/dL — AB (ref 12.0–15.0)
POTASSIUM: 3.9 mmol/L (ref 3.5–5.1)
Sodium: 133 mmol/L — ABNORMAL LOW (ref 135–145)
TCO2: 34 mmol/L (ref 0–100)

## 2015-08-12 LAB — RAPID URINE DRUG SCREEN, HOSP PERFORMED
Amphetamines: NOT DETECTED
BENZODIAZEPINES: NOT DETECTED
Barbiturates: NOT DETECTED
Cocaine: NOT DETECTED
Opiates: POSITIVE — AB
Tetrahydrocannabinol: NOT DETECTED

## 2015-08-12 LAB — COMPREHENSIVE METABOLIC PANEL
ALT: 9 U/L — AB (ref 14–54)
AST: 25 U/L (ref 15–41)
Albumin: 2.4 g/dL — ABNORMAL LOW (ref 3.5–5.0)
Alkaline Phosphatase: 99 U/L (ref 38–126)
Anion gap: 15 (ref 5–15)
BUN: 15 mg/dL (ref 6–20)
CHLORIDE: 93 mmol/L — AB (ref 101–111)
CO2: 28 mmol/L (ref 22–32)
CREATININE: 0.54 mg/dL (ref 0.44–1.00)
Calcium: 9 mg/dL (ref 8.9–10.3)
Glucose, Bld: 102 mg/dL — ABNORMAL HIGH (ref 65–99)
POTASSIUM: 4.1 mmol/L (ref 3.5–5.1)
Sodium: 136 mmol/L (ref 135–145)
TOTAL PROTEIN: 6.1 g/dL — AB (ref 6.5–8.1)
Total Bilirubin: 0.9 mg/dL (ref 0.3–1.2)

## 2015-08-12 LAB — URINE MICROSCOPIC-ADD ON

## 2015-08-12 LAB — DIFFERENTIAL
BASOS PCT: 0 %
Basophils Absolute: 0 10*3/uL (ref 0.0–0.1)
EOS ABS: 0 10*3/uL (ref 0.0–0.7)
Eosinophils Relative: 0 %
Lymphocytes Relative: 5 %
Lymphs Abs: 0.3 10*3/uL — ABNORMAL LOW (ref 0.7–4.0)
MONO ABS: 1 10*3/uL (ref 0.1–1.0)
MONOS PCT: 14 %
Neutro Abs: 5.9 10*3/uL (ref 1.7–7.7)
Neutrophils Relative %: 82 %

## 2015-08-12 LAB — ACTH: ACTH: 41.6 pg/mL (ref 7.2–63.3)

## 2015-08-12 LAB — CORTISOL: Cortisol: 26.1 ug/dL

## 2015-08-12 LAB — APTT: aPTT: 36 seconds (ref 24–37)

## 2015-08-12 LAB — PROTIME-INR
INR: 1.34 (ref 0.00–1.49)
Prothrombin Time: 16.7 seconds — ABNORMAL HIGH (ref 11.6–15.2)

## 2015-08-12 LAB — I-STAT TROPONIN, ED: TROPONIN I, POC: 0 ng/mL (ref 0.00–0.08)

## 2015-08-12 LAB — MRSA PCR SCREENING: MRSA BY PCR: NEGATIVE

## 2015-08-12 LAB — ETHANOL

## 2015-08-12 MED ORDER — MAGNESIUM SULFATE 2 GM/50ML IV SOLN
2.0000 g | INTRAVENOUS | Status: AC
  Filled 2015-08-12 (×2): qty 50

## 2015-08-12 MED ORDER — PANTOPRAZOLE SODIUM 40 MG IV SOLR
40.0000 mg | Freq: Every day | INTRAVENOUS | Status: DC
  Administered 2015-08-12 – 2015-08-13 (×2): 40 mg via INTRAVENOUS
  Filled 2015-08-12 (×2): qty 40

## 2015-08-12 MED ORDER — IOHEXOL 350 MG/ML SOLN: 50.0000 mL | Freq: Once | INTRAVENOUS | Status: DC | PRN

## 2015-08-12 MED ORDER — IOHEXOL 350 MG/ML SOLN
50.0000 mL | Freq: Once | INTRAVENOUS | Status: AC | PRN
  Administered 2015-08-12: 50 mL via INTRAVENOUS

## 2015-08-12 MED ORDER — FENTANYL 25 MCG/HR TD PT72
75.0000 ug | MEDICATED_PATCH | TRANSDERMAL | Status: DC
  Administered 2015-08-12: 75 ug via TRANSDERMAL
  Filled 2015-08-12: qty 1
  Filled 2015-08-12: qty 3

## 2015-08-12 MED ORDER — SODIUM CHLORIDE 0.9 % IV SOLN: 30.0000 ug/h | INTRAVENOUS | Status: DC

## 2015-08-12 MED ORDER — SENNOSIDES-DOCUSATE SODIUM 8.6-50 MG PO TABS: 1.0000 | ORAL_TABLET | Freq: Every evening | ORAL | Status: DC | PRN

## 2015-08-12 MED ORDER — ALTEPLASE (STROKE) FULL DOSE INFUSION
53.0000 mg | Freq: Once | INTRAVENOUS | Status: AC
  Administered 2015-08-12: 53 mg via INTRAVENOUS
  Filled 2015-08-12: qty 100

## 2015-08-12 MED ORDER — STROKE: EARLY STAGES OF RECOVERY BOOK
Freq: Once | Status: DC
  Filled 2015-08-12: qty 1

## 2015-08-12 MED ORDER — ACETAMINOPHEN 325 MG PO TABS
650.0000 mg | ORAL_TABLET | ORAL | Status: DC | PRN
  Administered 2015-08-15 – 2015-08-18 (×2): 650 mg via ORAL
  Filled 2015-08-12 (×2): qty 2

## 2015-08-12 MED ORDER — SODIUM CHLORIDE 0.9 % IV SOLN
INTRAVENOUS | Status: DC
  Administered 2015-08-12 – 2015-08-13 (×2): via INTRAVENOUS
  Administered 2015-08-13: 75 mL/h via INTRAVENOUS
  Administered 2015-08-14 – 2015-08-17 (×3): via INTRAVENOUS

## 2015-08-12 MED ORDER — ACETAMINOPHEN 650 MG RE SUPP: 650.0000 mg | RECTAL | Status: DC | PRN

## 2015-08-12 MED ORDER — LABETALOL HCL 5 MG/ML IV SOLN
10.0000 mg | INTRAVENOUS | Status: DC | PRN
  Administered 2015-08-12: 10 mg via INTRAVENOUS
  Filled 2015-08-12: qty 4

## 2015-08-12 MED ORDER — ONDANSETRON HCL 4 MG/2ML IJ SOLN: 4.0000 mg | Freq: Three times a day (TID) | INTRAMUSCULAR | Status: AC | PRN

## 2015-08-12 NOTE — ED Notes (Signed)
Code stroke with LKN of 10:00am.  CBG 117. 136/78, 110 HR 2L nasal cannula at 100%.

## 2015-08-12 NOTE — Progress Notes (Signed)
Repeat CTA reveals no LVO. Discussed with radiology. Continue standard post-tPA protocol, including frequent neuro checks and BP management.   Kerney Elbe, MD

## 2015-08-12 NOTE — Consult Note (Addendum)
Requesting Physician: Dr. Sabra Heck    Chief Complaint: Code stroke  HPI:                                                                                                                                         Nicole Valentine is an 50 y.o. female who has extensive cancer history. She was to go on a trip with her husband today when at 1000 she was noted to suddenly have difficulty expressing herself. She was brought to [facility] hospital as a code stroke. She was noted to have difficulty expressing herself but was able to follow commands and move all extremities well. CT was negative and CTA head and neck showed no large branch occlusion. tPA was offered and husband wanted to go forward.   Date last known well: 3.23.2017 Time last known well: Time: 10:00 tPA Given: Yes   Past Medical History  Diagnosis Date  . Complication of anesthesia     her father and uncle both had very difficult time waking up-was  something they told them may be hereditory. she will find out.  [name] Hypothyroidism   . 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 Radiation 01/21/13-03/06/13    Right Breast  . Breast cancer ([place]) dx'd 04-10-12-rt    Past Surgical History  Procedure Laterality Date  . Wisdom tooth extraction    . Portacath placement  04/22/2012    Procedure: INSERTION PORT-A-CATH;  Surgeon: [name], MD;  Location: [place];  Service: General;  Laterality: Left;  [name] Modified mastectomy Right 11/18/2012    Procedure:  RIGHT MODIFIED MASTECTOMY;  Surgeon: [name], MD;  Location: [place];  Service: General;  Laterality: Right;  . Knee arthroscopy Right 11/26/2013  . Port-a-cath removal Left 12/10/2013    Procedure: MINOR REMOVAL PORT-A-CATH;  Surgeon: [name], MD;  Location: ;  Service: General;  Laterality: Left;    Family History  Problem Relation Age of Onset  . Lung cancer Maternal Grandfather 51    smoker  . Prostate cancer  Maternal Grandfather 90  . Throat cancer Other     Great Aunt x 2  . Liver cancer Other     Maternal Great Grandmother  . Melanoma Maternal Uncle 81  . Brain cancer Cousin 11    non-malignant  . Allergic rhinitis Neg Hx   . Asthma Neg Hx   . Eczema Neg Hx   . Urticaria Neg Hx    Social History:  reports that she has never smoked. She has never used smokeless tobacco. She reports that she does not drink alcohol or use illicit drugs.  Allergies:  Allergies  Allergen Reactions  . Almond Meal Rash  . Almond Oil Rash  . Other Rash    LOTIONS-unknown type  . Tegaderm Ag Mesh [Silver] Rash  . Vancomycin Rash  Red Man Syndrome    Medications:                                                                                                                           Current Facility-Administered Medications  Medication Dose Route Frequency Provider Last Rate Last Dose  . alteplase (ACTIVASE) 1 mg/mL infusion 53 mg  53 mg Intravenous Once Kerney Elbe, MD 53 mL/hr at 08/12/15 1252 53 mg at 08/12/15 1252  . magnesium sulfate IVPB 2 g 50 mL  2 g Intravenous STAT Noemi Chapel, MD       Current Outpatient Prescriptions  Medication Sig Dispense Refill  . cholecalciferol (VITAMIN D) 1000 UNITS tablet Take 2,000 Units by mouth 2 (two) times daily.     [name] dextromethorphan-guaiFENesin (MUCINEX DM) 30-600 MG 12hr tablet Take 1 tablet by mouth 2 (two) times daily as needed for cough. Reported on 08/10/2015    . levofloxacin (LEVAQUIN) 500 MG tablet Take 1 tablet (500 mg total) by mouth daily. (Patient not taking: Reported on 08/10/2015) 7 tablet 0  . levothyroxine (SYNTHROID, LEVOTHROID) 175 MCG tablet Take 175 mcg by mouth daily before breakfast.    . Loratadine-Pseudoephedrine (CLARITIN-D 24 HOUR PO) Take 1 tablet by mouth every morning. Reported on 08/10/2015    . LORazepam (ATIVAN) 0.5 MG tablet Take 1 tablet (0.5 mg total) by mouth at bedtime as needed (Nausea or vomiting). 60 tablet 2  .  magnesium oxide (MAG-OX) 400 MG tablet Take 600 mg by mouth daily. Pt takes 2 capsules  by mouth 3 times daily.    [name] oxyCODONE (OXY IR/ROXICODONE) 5 MG immediate release tablet Take 1 tablet (5 mg total) by mouth every 6 (six) hours as needed for severe pain. 30 tablet 0  . oxyCODONE (OXYCONTIN) 10 mg 12 hr tablet Take 1 tablet (10 mg total) by mouth every 12 (twelve) hours. 60 tablet 0  . pantoprazole (PROTONIX) 40 MG tablet Take 1 tablet (40 mg total) by mouth daily at 12 noon. 30 tablet 2  . Saline 0.2 % GEL Place 1 Squirt into the nose at bedtime.    . sodium chloride (OCEAN) 0.65 % SOLN nasal spray Place 1 spray into both nostrils QID.     Facility-Administered Medications Ordered in Other Encounters  Medication Dose Route Frequency Provider Last Rate Last Dose  . sodium chloride flush (NS) 0.9 % injection 10 mL  10 mL Intravenous PRN Chauncey Cruel, MD   10 mL at 08/11/15 1408     ROS:  History obtained from husband. Does not endorse headache or vision loss.   Neurologic Examination:                                                                                                      Blood pressure 133/89, pulse 111, temperature 98.1 F (36.7 C), temperature source Oral, resp. rate 19, last menstrual period 03/29/2012, SpO2 98 %.  HEENT-  Normocephalic, hair loss noted.  Normal external eye and conjunctiva.   Extremities- no edema. Warm and well perfused.   Neurological Examination Mental Status: Alert, oriented, able to repeat, follow commands, name but showed slow mentation and difficulty expressing herself. No dysarthria. Able to do 3 step commands with significantly delayed latency of responses.  Cranial Nerves: II:  Visual fields grossly normal, pupils equal, round, reactive to light and accommodation III,IV, VI: ptosis not present, extra-ocular  motions intact bilaterally V,VII: smile symmetric, facial light touch sensation normal bilaterally VIII: hearing normal bilaterally IX,X: uvula rises symmetrically XI: bilateral shoulder shrug XII: midline tongue extension Motor: Right : Upper extremity   5/5    Left:     Upper extremity   5/5  Lower extremity   5/5     Lower extremity   5/5 Tone and bulk:normal tone throughout; no atrophy noted Sensory: Pinprick and light touch intact throughout, bilaterally, neglected right side with DSS Deep Tendon Reflexes: 2+ and symmetric throughout Plantars: Right: downgoing   Left: downgoing Cerebellar: normal finger-to-nose, and normal heel-to-shin test Gait: not tested    Lab Results: Basic Metabolic Panel:  Recent Labs Lab 08/10/15 1427 08/10/15 1428 08/12/15 1221  NA  --  138 133*  K  --  3.7 3.9  CL  --   --  88*  CO2  --  34*  --   GLUCOSE  --  108 103*  BUN  --  13.5 16  CREATININE  --  0.6 0.50  CALCIUM  --  9.4  --   MG  --  1.2*  --   PHOS 4.0  --   --     Liver Function Tests:  Recent Labs Lab 08/10/15 1428  AST 17  ALT <9  ALKPHOS 105  BILITOT 0.59  PROT 6.6  ALBUMIN 2.4*   No results for input(s): LIPASE, AMYLASE in the last 168 hours. No results for input(s): AMMONIA in the last 168 hours.  CBC:  Recent Labs Lab 08/10/15 1428 08/12/15 1221  WBC 7.4  --   NEUTROABS 6.3  --   HGB 10.3* 11.2*  HCT 31.1* 33.0*  MCV 105.3*  --   PLT 323  --     Cardiac Enzymes: No results for input(s): CKTOTAL, CKMB, CKMBINDEX, TROPONINI in the last 168 hours.  Lipid Panel: No results for input(s): CHOL, TRIG, HDL, CHOLHDL, VLDL, LDLCALC in the last 168 hours.  CBG: No results for input(s): GLUCAP in the last 168 hours.  Microbiology: Results for orders placed or performed in visit on 05/19/15  Urine culture     Status: None   Collection Time: 05/19/15  3:21 PM  Result Value Ref Range Status   Urine Culture, Routine Culture, Urine  Final    Comment:  Final - ===== COLONY COUNT: ===== NO GROWTH NO GROWTH    *Note: Due to a large number of results and/or encounters for the requested time period, some results have not been displayed. A complete set of results can be found in Results Review.    Coagulation Studies:  Recent Labs  08/12/15 1218  LABPROT 16.7*  INR 1.34    Imaging: Ct Head Wo Contrast  08/12/2015  CLINICAL DATA:  Code stroke, right facial droop EXAM: CT HEAD WITHOUT CONTRAST TECHNIQUE: Contiguous axial images were obtained from the base of the skull through the vertex without intravenous contrast. COMPARISON:  None. FINDINGS: There is no evidence of mass effect, midline shift or extra-axial fluid collections. There is no evidence of a space-occupying lesion or intracranial hemorrhage. There is no evidence of a cortical-based area of acute infarction. The ventricles and sulci are appropriate for the patient's age. The basal cisterns are patent. Visualized portions of the orbits are unremarkable. The visualized portions of the paranasal sinuses and mastoid air cells are unremarkable. The osseous structures are unremarkable. IMPRESSION: No acute intracranial pathology. These results were called by telephone at the time of interpretation on 08/12/2015 at 12:30 pm to Dr. Cheral Marker, who verbally acknowledged these results. Electronically Signed   By: Kathreen Devoid   On: 08/12/2015 12:51    Assessment and plan discussed with with attending physician and they are in agreement.    Etta Quill PA-C [facility] [phone] 08/12/2015, 1:11 PM   Assessment: 50 y.o. 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 tPA was administered after family gave consent and no contraindications were found. CTA prior to IV tPA showed no LVO.   Recommendations: 1. Post-tPA MICU protocol to include BP management 2. MRI of the brain without contrast 3. PT consult, OT consult, Speech  consult 4. Echocardiogram 6. Prophylactic therapy-None as tPA was given 7. Risk factor modification 8. Telemetry monitoring 9. Frequent neuro checks 10 NPO until passes stroke swallow screen 11. HgbA1c, fasting lipid panel 12. Repeat CT in 24 hours to rule out hemorrhagic conversion 13. Please page stroke NP  Or  PA  Or MD  M-F from 8am -4 pm  this patient will now be followed by the stroke team at this point.   You can look them up on [email or web address]  Password [email or web address]   Addendum: At 1530 following IV tPA, the patient was noted to have worsening aphasia and possible left pupillary changes. Brought to CT again with no hemorrhage or other changes seen. Talking to husband the left pupil has always been 2 mm larger. She does have increased expressive and receptive aphasia with worsening right sided neglect on exam. BP has been stable 140/90.  Likely extended stroke. Called Dr. Patrecia Pour and he recommended to obtain a MRA head and MRI brain w/wo contrast. The patient was unable to tolerate MRA, so she was taken to CT for repeat CTA.   Kerney Elbe, MD   Greater than 60 minutes of time expended in bedside examinations, management and coordination of care for this critically ill patient.

## 2015-08-12 NOTE — Progress Notes (Signed)
Patient transported to 3M05 via bed with heart monitor and O2 at 2L [place].  Upon arrival to ICU patient aphasic and slight Right facial droop at rest. Patient denies HA.   Dr Cheral Marker notified of change in neuro assessment.

## 2015-08-12 NOTE — Code Documentation (Signed)
Patient last known well this am at 10:00am while she and her husband were packing for a trip. She has metastatic CA. She started having trouble speaking.  EMS was called.  Code Stroke was called in route at 1159.  Patient arrived at 1209.  Stat head CT and labs done.  Dr Cheral Marker at bedside to assess patient.  NIHSS 2, missed 1 question and mild to moderate aphasia.  CTA head and neck done.  TPA given at 1252.  Patient tolerating well.  Plan: [place] ICU

## 2015-08-12 NOTE — ED Provider Notes (Signed)
CSN: [id number]     Arrival date & time 08/12/15  1209 History   None    Chief Complaint  Patient presents with  . Code Stroke     (Consider location/radiation/quality/duration/timing/severity/associated sxs/prior Treatment) HPI  The patient is a 50 year old female, she unfortunately has been diagnosed with breast cancer which has metastasized on initial diagnosis, over the last year she has found increasing metastatic disease including thoracic spine, lungs with resultant pleural effusions as well as brain metastatic disease and has undergone radiation therapy of the brain, ongoing chemotherapy and other adjuvant therapies. Today the patient presents after having acute onset of difficulty speaking which occurred at approximately 10:00 this morning, she notes that this difficulty speaking has been persistent, she denies any other symptoms. Last seen normal time was 10:00 this morning according to the husband based on the paramedics report. The husband is not present with the patient on the initial encounter. Reportedly the patient was trying to prepare for a trip to [place] with the family.    Past Medical History  Diagnosis Date  . Complication of anesthesia     her father and uncle both had very difficult time waking up-was  something they told them may be hereditory. she will find out.  [name] Hypothyroidism   . 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 Radiation 01/21/13-03/06/13    Right Breast  . Breast cancer ([place]) dx'd 04-10-12-rt   Past Surgical History  Procedure Laterality Date  . Wisdom tooth extraction    . Portacath placement  04/22/2012    Procedure: INSERTION PORT-A-CATH;  Surgeon: [name], MD;  Location: [facility];  Service: General;  Laterality: Left;  [name] Modified mastectomy Right 11/18/2012    Procedure:  RIGHT MODIFIED MASTECTOMY;  Surgeon: [name], MD;  Location: [facility];  Service: General;  Laterality: Right;  . Knee  arthroscopy Right 11/26/2013  . Port-a-cath removal Left 12/10/2013    Procedure: MINOR REMOVAL PORT-A-CATH;  Surgeon: [name], MD;  Location: [facility];  Service: General;  Laterality: Left;   Family History  Problem Relation Age of Onset  . Lung cancer Maternal Grandfather 67    smoker  . Prostate cancer Maternal Grandfather 90  . Throat cancer Other     Great Aunt x 2  . Liver cancer Other     Maternal Great Grandmother  . Melanoma Maternal Uncle 81  . Brain cancer Cousin 11    non-malignant  . Allergic rhinitis Neg Hx   . Asthma Neg Hx   . Eczema Neg Hx   . Urticaria Neg Hx    Social History  Substance Use Topics  . Smoking status: Never Smoker   . Smokeless tobacco: Never Used  . Alcohol Use: No   OB History    No data available     Review of Systems  All other systems reviewed and are negative.     Allergies  Almond meal; Almond oil; Other; Tegaderm ag mesh; and Vancomycin  Home Medications   Prior to Admission medications   Medication Sig Start Date End Date Taking? Authorizing Provider  cholecalciferol (VITAMIN D) 1000 UNITS tablet Take 2,000 Units by mouth 2 (two) times daily.     Historical Provider, MD  dextromethorphan-guaiFENesin (MUCINEX DM) 30-600 MG 12hr tablet Take 1 tablet by mouth 2 (two) times daily as needed for cough. Reported on 08/10/2015    Historical Provider, MD  levofloxacin [facility])  500 MG tablet Take 1 tablet (500 mg total) by mouth daily. Patient not taking: Reported on 08/10/2015 08/05/15   Laurie Panda, NP  levothyroxine (SYNTHROID, LEVOTHROID) 175 MCG tablet Take 175 mcg by mouth daily before breakfast.    Historical Provider, MD  Loratadine-Pseudoephedrine (CLARITIN-D 24 HOUR PO) Take 1 tablet by mouth every morning. Reported on 08/10/2015    Historical Provider, MD  LORazepam (ATIVAN) 0.5 MG tablet Take 1 tablet (0.5 mg total) by mouth at bedtime as needed (Nausea or vomiting). 07/28/15   Chauncey Cruel,  MD  magnesium oxide (MAG-OX) 400 MG tablet Take 600 mg by mouth daily. Pt takes 2 capsules  by mouth 3 times daily. 07/29/15   Historical Provider, MD  oxyCODONE (OXY IR/ROXICODONE) 5 MG immediate release tablet Take 1 tablet (5 mg total) by mouth every 6 (six) hours as needed for severe pain. 08/11/15   Chauncey Cruel, MD  oxyCODONE (OXYCONTIN) 10 mg 12 hr tablet Take 1 tablet (10 mg total) by mouth every 12 (twelve) hours. 08/11/15   Chauncey Cruel, MD  pantoprazole (PROTONIX) 40 MG tablet Take 1 tablet (40 mg total) by mouth daily at 12 noon. 07/28/15   Laurie Panda, NP  Saline 0.2 % GEL Place 1 Squirt into the nose at bedtime.    Historical Provider, MD  sodium chloride (OCEAN) 0.65 % SOLN nasal spray Place 1 spray into both nostrils QID.    Historical Provider, MD   BP 133/89 mmHg  Pulse 111  Temp(Src) 98.1 F (36.7 C) (Oral)  Resp 19  SpO2 98%  LMP 03/29/2012 Physical Exam  Constitutional: She appears well-developed and well-nourished. No distress.  HENT:  Head: Normocephalic and atraumatic.  Mouth/Throat: Oropharynx is clear and moist. No oropharyngeal exudate.  Eyes: Conjunctivae and EOM are normal. Pupils are equal, round, and reactive to light. Right eye exhibits no discharge. Left eye exhibits no discharge. No scleral icterus.  Neck: Normal range of motion. Neck supple. No JVD present. No thyromegaly present.  Cardiovascular: Normal rate, regular rhythm, normal heart sounds and intact distal pulses.  Exam reveals no gallop and no friction rub.   No murmur heard. Pulmonary/Chest: Effort normal and breath sounds normal. No respiratory distress. She has no wheezes. She has no rales.  Abdominal: Soft. Bowel sounds are normal. She exhibits no distension and no mass. There is no tenderness.  Musculoskeletal: Normal range of motion. She exhibits no edema or tenderness.  Lymphadenopathy:    She has no cervical adenopathy.  Neurological: She is alert. Coordination normal.   Cranial nerves III through XII are intact, there is no facial droop, coordination is normal, she has normal grips bilaterally, she has normal strength and sensation in the bilateral lower extremities. Her pupillary exam is normal, extraocular movements are normal, her speech is clear but she has some difficulty with expressive aphasia. She eventually gets all of her words out correctly.  Skin: Skin is warm and dry. No rash noted. No erythema.  Psychiatric: She has a normal mood and affect. Her behavior is normal.  Nursing note and vitals reviewed.   ED Course  Procedures (including critical care time) Labs Review Labs Reviewed  PROTIME-INR - Abnormal; Notable for the following:    Prothrombin Time 16.7 (*)    All other components within normal limits  I-STAT CHEM 8, ED - Abnormal; Notable for the following:    Sodium 133 (*)    Chloride 88 (*)    Glucose, Bld 103 (*)  Calcium, Ion 1.07 (*)    Hemoglobin 11.2 (*)    HCT 33.0 (*)    All other components within normal limits  ETHANOL  APTT  CBC  DIFFERENTIAL  COMPREHENSIVE METABOLIC PANEL  URINE RAPID DRUG SCREEN, HOSP PERFORMED  URINALYSIS, ROUTINE W REFLEX MICROSCOPIC (NOT AT [facility])  I-STAT TROPOININ, ED    Imaging Review Ct Head Wo Contrast  08/12/2015  CLINICAL DATA:  Code stroke, right facial droop EXAM: CT HEAD WITHOUT CONTRAST TECHNIQUE: Contiguous axial images were obtained from the base of the skull through the vertex without intravenous contrast. COMPARISON:  None. FINDINGS: There is no evidence of mass effect, midline shift or extra-axial fluid collections. There is no evidence of a space-occupying lesion or intracranial hemorrhage. There is no evidence of a cortical-based area of acute infarction. The ventricles and sulci are appropriate for the patient's age. The basal cisterns are patent. Visualized portions of the orbits are unremarkable. The visualized portions of the paranasal sinuses and mastoid air cells are  unremarkable. The osseous structures are unremarkable. IMPRESSION: No acute intracranial pathology. These results were called by telephone at the time of interpretation on 08/12/2015 at 12:30 pm to Dr. Cheral Marker, who verbally acknowledged these results. Electronically Signed   By: Kathreen Devoid   On: 08/12/2015 12:51   I have personally reviewed and evaluated these images and lab results as part of my medical decision-making.   EKG Interpretation   Date/Time:  Thursday August 12 2015 13:03:54 EDT Ventricular Rate:  113 PR Interval:  144 QRS Duration: 75 QT Interval:  331 QTC Calculation: [id number] Axis:   89 Text Interpretation:  Sinus tachycardia since last tracing no significant  change Abnormal ekg Confirmed by Sabra Heck  MD, Delancey Moraes ([id number]) on 08/12/2015  1:12:04 PM      MDM   Final diagnoses:  Acute ischemic stroke ([place])    The patient has an abnormal neurologic exam but I would suspect this with multiple brain metastatic lesions as well as considering that she has potential new lesions, potential edema surrounding the existing lesions, CT would hopefully differentiate between different etiologies.. Her symptoms are very mild with only a slight expressive aphasia. Neurology is at the bedside, the patient is at CT scan, we'll get neurology recommendations upon return to the room.  Neurology has seen the patient, they do agree to thrombolytic therapy, the nurses mixing and at this time, the patient has a new focal neurologic lesion which is life altering, the neurologist and the patient have discussed this at length and they have agreed to thrombolytic therapy. The patient is critically ill, she will need to go to the intensive care unit  CRITICAL CARE Performed by: Johnna Acosta Total critical care time: 35 minutes Critical care time was exclusive of separately billable procedures and treating other patients. Critical care was necessary to treat or prevent imminent or life-threatening  deterioration. Critical care was time spent personally by me on the following activities: development of treatment plan with patient and/or surrogate as well as nursing, discussions with consultants, evaluation of patient's response to treatment, examination of patient, obtaining history from patient or surrogate, ordering and performing treatments and interventions, ordering and review of laboratory studies, ordering and review of radiographic studies, pulse oximetry and re-evaluation of patient's condition.   Noemi Chapel, MD 08/12/15 [phone]

## 2015-08-13 ENCOUNTER — Inpatient Hospital Stay (HOSPITAL_COMMUNITY): Payer: BLUE CROSS/BLUE SHIELD

## 2015-08-13 ENCOUNTER — Other Ambulatory Visit (HOSPITAL_COMMUNITY): Payer: BLUE CROSS/BLUE SHIELD

## 2015-08-13 ENCOUNTER — Encounter: Payer: Self-pay | Admitting: *Deleted

## 2015-08-13 DIAGNOSIS — R4182 Altered mental status, unspecified: Secondary | ICD-10-CM | POA: Insufficient documentation

## 2015-08-13 DIAGNOSIS — C778 Secondary and unspecified malignant neoplasm of lymph nodes of multiple regions: Secondary | ICD-10-CM

## 2015-08-13 DIAGNOSIS — C7802 Secondary malignant neoplasm of left lung: Secondary | ICD-10-CM

## 2015-08-13 DIAGNOSIS — R410 Disorientation, unspecified: Secondary | ICD-10-CM

## 2015-08-13 DIAGNOSIS — C50411 Malignant neoplasm of upper-outer quadrant of right female breast: Secondary | ICD-10-CM

## 2015-08-13 DIAGNOSIS — C7931 Secondary malignant neoplasm of brain: Principal | ICD-10-CM

## 2015-08-13 DIAGNOSIS — J90 Pleural effusion, not elsewhere classified: Secondary | ICD-10-CM

## 2015-08-13 DIAGNOSIS — R4701 Aphasia: Secondary | ICD-10-CM

## 2015-08-13 DIAGNOSIS — R471 Dysarthria and anarthria: Secondary | ICD-10-CM

## 2015-08-13 DIAGNOSIS — C7951 Secondary malignant neoplasm of bone: Secondary | ICD-10-CM

## 2015-08-13 DIAGNOSIS — C7971 Secondary malignant neoplasm of right adrenal gland: Secondary | ICD-10-CM

## 2015-08-13 DIAGNOSIS — C7801 Secondary malignant neoplasm of right lung: Secondary | ICD-10-CM

## 2015-08-13 LAB — LIPID PANEL
CHOLESTEROL: 175 mg/dL (ref 0–200)
HDL: 24 mg/dL — ABNORMAL LOW (ref >40)
LDL Cholesterol: 125 mg/dL — ABNORMAL HIGH (ref 0–99)
TRIGLYCERIDES: 131 mg/dL (ref <150)
Total CHOL/HDL Ratio: 7.3 RATIO
VLDL: 26 mg/dL (ref 0–40)

## 2015-08-13 MED ORDER — LORATADINE 10 MG PO TABS
10.0000 mg | ORAL_TABLET | Freq: Every day | ORAL | Status: DC
  Administered 2015-08-13 – 2015-08-15 (×3): 10 mg via ORAL
  Filled 2015-08-13 (×4): qty 1

## 2015-08-13 MED ORDER — SALINE SPRAY 0.65 % NA SOLN
1.0000 | Freq: Four times a day (QID) | NASAL | Status: DC
  Administered 2015-08-13 – 2015-08-16 (×3): 1 via NASAL
  Filled 2015-08-13: qty 44

## 2015-08-13 MED ORDER — LORAZEPAM 0.5 MG PO TABS
0.5000 mg | ORAL_TABLET | Freq: Every evening | ORAL | Status: DC | PRN
  Administered 2015-08-14 – 2015-08-16 (×3): 0.5 mg via ORAL
  Filled 2015-08-13 (×4): qty 1

## 2015-08-13 MED ORDER — VITAMIN D 1000 UNITS PO TABS
2000.0000 [IU] | ORAL_TABLET | Freq: Two times a day (BID) | ORAL | Status: DC
  Administered 2015-08-13 – 2015-08-17 (×9): 2000 [IU] via ORAL
  Filled 2015-08-13 (×9): qty 2

## 2015-08-13 MED ORDER — GADOBENATE DIMEGLUMINE 529 MG/ML IV SOLN
11.0000 mL | Freq: Once | INTRAVENOUS | Status: AC | PRN
  Administered 2015-08-13: 11 mL via INTRAVENOUS

## 2015-08-13 MED ORDER — LEVOTHYROXINE SODIUM 75 MCG PO TABS
175.0000 ug | ORAL_TABLET | Freq: Every day | ORAL | Status: DC
  Administered 2015-08-13 – 2015-08-18 (×6): 175 ug via ORAL
  Filled 2015-08-13 (×6): qty 1

## 2015-08-13 MED ORDER — LEVETIRACETAM ER 500 MG PO TB24
1000.0000 mg | ORAL_TABLET | Freq: Every day | ORAL | Status: DC
  Administered 2015-08-13 – 2015-08-18 (×6): 1000 mg via ORAL
  Filled 2015-08-13 (×11): qty 2

## 2015-08-13 NOTE — Progress Notes (Signed)
OT Cancellation Note  Patient Details Name: Nicole Valentine MRN: [id number] DOB: 02-Feb-1966   Cancelled Treatment:    Reason Eval/Treat Not Completed: Other (comment) Pt has strict bedrest orders. Please update activity orders to begin therapy. Thanks [place], OTR/L  [phone] 08/13/2015 08/13/2015, 6:07 AM

## 2015-08-13 NOTE — Progress Notes (Signed)
[address]       [place] [address]             [phone]                      LOS: 1 day   Subjective: Admitted yesterday, after trouble speaking  Objective: Vital signs in last 24 hours: Patient Vitals for the past 24 hrs:  BP Temp Temp src Pulse Resp SpO2  08/13/15 1800 (!) 148/91 mmHg - - (!) 102 (!) 29 99 %  08/13/15 1751 (!) 156/97 mmHg - - 88 - -  08/13/15 1600 - 98.1 F (36.7 C) Oral - - -  08/13/15 1400 137/83 mmHg - - 100 (!) 27 -  08/13/15 1300 (!) 136/91 mmHg - - (!) 105 (!) 30 -  08/13/15 1200 (!) 147/94 mmHg - - (!) 105 (!) 28 -  08/13/15 1100 (!) 149/94 mmHg - - 100 (!) 24 -  08/13/15 1000 (!) 138/105 mmHg - - (!) 108 (!) 29 -  08/13/15 0900 (!) 154/93 mmHg - - (!) 104 (!) 27 -  08/13/15 0800 (!) 141/101 mmHg 98.4 F (36.9 C) Oral (!) 108 (!) 31 98 %  08/13/15 0700 (!) 165/91 mmHg - - (!) 108 (!) 23 -  08/13/15 0600 (!) 160/90 mmHg - - (!) 111 (!) 22 100 %  08/13/15 0500 (!) 149/95 mmHg 98.2 F (36.8 C) Oral (!) 108 (!) 25 -  08/13/15 0400 (!) 169/86 mmHg - - (!) 110 (!) 35 100 %  08/13/15 0300 (!) 133/95 mmHg - - (!) 106 (!) 26 -  08/13/15 0200 125/86 mmHg - - (!) 116 (!) 22 100 %  08/13/15 0100 130/85 mmHg - - (!) 112 (!) 22 -  08/13/15 0000 (!) 135/58 mmHg 98.5 F (36.9 C) Oral (!) 114 (!) 29 100 %  08/12/15 2300 135/87 mmHg - - (!) 114 (!) 22 -  08/12/15 2200 124/90 mmHg - - (!) 119 (!) 24 99 %  08/12/15 2130 113/87 mmHg - - (!) 114 (!) 31 -  08/12/15 2100 (!) 141/79 mmHg - - (!) 120 (!) 22 -  08/12/15 2030 (!) 145/80 mmHg - - (!) 111 (!) 25 -  08/12/15 2000 (!) 146/89 mmHg 98.5 F (36.9 C) Oral (!) 108 (!) 23 99 %  08/12/15 1930 (!) 136/92 mmHg - - (!) 109 (!) 33 -  08/12/15 1900 (!) 148/94 mmHg 98.6 F (37 C) - (!) 110 (!) 26 -    There were no vitals filed for this visit.  Hemodynamic parameters for last 24 hours:    Intake/Output from previous day: 03/23 0701 - 03/24 0700 In: 900 [I.V.:900] Out: 700  [Urine:700] Intake/Output this shift: Total I/O In: 675 [I.V.:675] Out: 550 [Urine:550]  Scheduled Meds: .  stroke: mapping our early stages of recovery book   Does not apply Once  . cholecalciferol  2,000 Units Oral BID  . fentaNYL  75 mcg Transdermal Q72H  . levETIRAcetam  1,000 mg Oral Daily  . levothyroxine  175 mcg Oral QAC breakfast  . loratadine  10 mg Oral Daily  . pantoprazole (PROTONIX) IV  40 mg Intravenous QHS  . sodium chloride  1 spray Each Nare QID   Continuous Infusions: . sodium chloride 75 mL/hr (08/13/15 1800)   PRN Meds:.acetaminophen **OR** acetaminophen, iohexol, labetalol, LORazepam, senna-docusate     Lab Results: CBC: Recent Labs  08/12/15 1218 08/12/15 1221  WBC  7.2  --   HGB 10.3* 11.2*  HCT 31.8* 33.0*  PLT 264  --    BMET:  Recent Labs  08/12/15 1218 08/12/15 1221  NA 136 133*  K 4.1 3.9  CL 93* 88*  CO2 28  --   GLUCOSE 102* 103*  BUN 15 16  CREATININE 0.54 0.50  CALCIUM 9.0  --     PT/INR:  Recent Labs  08/12/15 1218  LABPROT 16.7*  INR 1.34     Radiology Ct Angio Head W/cm &/or Wo Cm  08/12/2015  CLINICAL DATA:  Stroke. Change in mental status following IV tPA. History of breast cancer with metastatic disease. EXAM: CT ANGIOGRAPHY HEAD AND NECK TECHNIQUE: Multidetector CT imaging of the head and neck was performed using the standard protocol during bolus administration of intravenous contrast. Multiplanar CT image reconstructions and MIPs were obtained to evaluate the vascular anatomy. Carotid stenosis measurements (when applicable) are obtained utilizing NASCET criteria, using the distal internal carotid diameter as the denominator. CONTRAST:  50 mL Omnipaque 350 IV COMPARISON:  CTA head and neck from earlier today. FINDINGS: CTA NECK Aortic arch: 3 vessel aortic arch. Aortic arch is normal. Proximal great vessels normal. Loculated right pleural effusion with atelectasis or infiltrate in the right upper lobe. Findings are  suspicious for metastatic disease versus pneumonia. Moderately large left pleural effusion. These findings are unchanged from earlier today. Stranding in the anterior mediastinal fat likely due to malignant adenopathy. Contrast injected through the left arm with significant stenosis of the left subclavian vein. Numerous venous collaterals are present due to reflux of contrast. There is reflux up the left jugular vein. Left innominate vein shows mild narrowing. Right carotid system: Right carotid is normal without evidence of stenosis or dissection. Left carotid system: Left carotid is normal without evidence of stenosis or dissection. Vertebral arteries:Both vertebral arteries patent to the basilar without significant stenosis. Skeleton: Negative for fracture. Sclerotic lesion T1 spinous process, suspicious for metastatic disease. Disc degeneration and spondylosis C6-7. Other neck: No adenopathy in the neck. CTA HEAD Anterior circulation: Cavernous carotid widely patent bilaterally. Anterior and middle cerebral arteries patent without stenosis or occlusion. Intracranial arteries are relatively small in caliber. No aneurysm. Posterior circulation: Both vertebral arteries patent to the basilar. Basilar widely patent. Superior cerebellar and posterior cerebral arteries patent bilaterally. Fetal origin left posterior cerebral artery. No aneurysm. Venous sinuses: Patent Anatomic variants: None Delayed phase: Image quality degraded by motion. No evidence of hydrocephalus or hemorrhage. IMPRESSION: Negative for large vessel occlusion. No change from earlier today. No significant carotid or vertebral artery stenosis. Findings compatible with metastatic disease in the chest. These results were called by telephone at the time of interpretation on 08/12/2015 at 6:05 pm to Dr. Kerney Elbe , who verbally acknowledged these results. Electronically Signed   By: Franchot Gallo M.D.   On: 08/12/2015 18:23   Ct Angio Head W/cm &/or  Wo Cm  08/12/2015  CLINICAL DATA:  50 year old female code stroke with right facial droop and confusion. Last seen normal 1000 hours. Initial encounter. Personal history of metastatic breast cancer to the brain status post whole brain radiation in November 2016. EXAM: CT ANGIOGRAPHY HEAD AND NECK TECHNIQUE: Multidetector CT imaging of the head and neck was performed using the standard protocol during bolus administration of intravenous contrast. Multiplanar CT image reconstructions and MIPs were obtained to evaluate the vascular anatomy. Carotid stenosis measurements (when applicable) are obtained utilizing NASCET criteria, using the distal internal carotid diameter as the  denominator. CONTRAST:  81mL OMNIPAQUE IOHEXOL 350 MG/ML SOLN COMPARISON:  Head CT without contrast 1219 hours today reported separately. Brain MRI 07/02/2015. FINDINGS: CTA NECK Skeleton: No destructive osseous lesion identified in the head or neck. There is a small sclerotic lesion in the T1 spinous process which is indeterminate. Other neck: Large layering left and loculated appearing right pleural effusions associated with severe compressive atelectasis in the visible lungs. Superimposed right perihilar airspace disease and irregular peri-bronchovascular opacity plus septal thickening. There is abnormal soft tissue scattered throughout the superior mediastinum suspicious for metastatic nodal disease. Diminutive thyroid. Laryngeal and pharyngeal soft tissue contours are within normal limits. Parapharyngeal and retropharyngeal spaces as well as sublingual space, submandibular glands, and parotid glands are within normal limits. Visualized orbit soft tissues are within normal limits. No cervical lymphadenopathy identified. Aortic arch: There is a left side IJ approach porta cath in place. There is associated left innominate vein stenosis. The left upper extremity was injected. Subsequently there is significant reflux of venous contrast into the  left internal jugular vein and paravertebral venous system, limiting the arterial bolus. Three vessel arch configuration. No arch atherosclerosis or great vessel origin stenosis. Right carotid system: Negative. Left carotid system: Negative. Vertebral arteries: No proximal right subclavian artery stenosis. Normal right vertebral artery origin. Negative right vertebral artery to the skullbase. No proximal left subclavian artery stenosis. Normal left vertebral artery origin. DT all of the left vertebral artery limited by 8 dense paravertebral venous contrast reflux, but no evidence of left vertebral stenosis to the skullbase. CTA HEAD Posterior circulation: Distal left vertebral artery is dominant, the right functionally terminates in PICA. Normal left PICA origin. No distal left vertebral artery stenosis. That vessel primarily supplies the basilar. No basilar artery stenosis. SCA and right PCA origins are patent. Fetal type left PCA origin. Right posterior communicating artery appears diminutive. Bilateral PCA branches are within normal limits. Anterior circulation: Both ICA siphons are patent with minimal to mild calcified plaque primarily on the right. Both carotid termini are patent. Bilateral MCA and ACA origins appear normal. Codominant A1 segments. Anterior communicating artery and proximal ACA branches are within normal limits. Right MCA M1 segment and right MCA bifurcation are patent. Proximal right MCA branches are within normal limits. Left MCA M1 segment and left MCA bifurcation are patent. No definite proximal left MCA M2 occlusion ; suboptimal arterial contrast volume limits evaluation of the second order and more distal circle of Willis branches throughout. Venous sinuses: Patent. Anatomic variants: Dominant distal left vertebral artery. Fetal type left PCA origin. IMPRESSION: 1. Negative for emergent large vessel occlusion, discussed in person with the Neurology team at 1245 hrs. 2. Suboptimal arterial  contrast bolus which appears related to left innominate vein stenosis. This limits detail of the second order and distal circle of Willis branches. 3. No arterial stenosis in the neck. 4. Abnormal chest findings with mediastinal lymphadenopathy, large layering left and large loculated appearing right pleural effusions. Right upper lobe consolidation and septal thickening. 5. No metastatic disease identified in the neck. Electronically Signed   By: Genevie Ann M.D.   On: 08/12/2015 13:22   Ct Head Wo Contrast  08/12/2015  CLINICAL DATA:  Mental status change.  Post tPA. EXAM: CT HEAD WITHOUT CONTRAST TECHNIQUE: Contiguous axial images were obtained from the base of the skull through the vertex without intravenous contrast. COMPARISON:  CT head 08/12/2015 FINDINGS: Ventricle size is normal. Negative for acute or chronic infarction. Negative for hemorrhage or fluid collection. Negative  for mass or edema. No shift of the midline structures. Calvarium is intact. IMPRESSION: Negative CT of the head. Negative for intracranial hemorrhage. No acute infarct. Electronically Signed   By: Franchot Gallo M.D.   On: 08/12/2015 15:45   Ct Head Wo Contrast  08/12/2015  CLINICAL DATA:  Code stroke, right facial droop EXAM: CT HEAD WITHOUT CONTRAST TECHNIQUE: Contiguous axial images were obtained from the base of the skull through the vertex without intravenous contrast. COMPARISON:  None. FINDINGS: There is no evidence of mass effect, midline shift or extra-axial fluid collections. There is no evidence of a space-occupying lesion or intracranial hemorrhage. There is no evidence of a cortical-based area of acute infarction. The ventricles and sulci are appropriate for the patient's age. The basal cisterns are patent. Visualized portions of the orbits are unremarkable. The visualized portions of the paranasal sinuses and mastoid air cells are unremarkable. The osseous structures are unremarkable. IMPRESSION: No acute intracranial  pathology. These results were called by telephone at the time of interpretation on 08/12/2015 at 12:30 pm to Dr. Cheral Marker, who verbally acknowledged these results. Electronically Signed   By: Kathreen Devoid   On: 08/12/2015 12:51   Ct Angio Neck W/cm &/or Wo/cm  08/12/2015  CLINICAL DATA:  Stroke. Change in mental status following IV tPA. History of breast cancer with metastatic disease. EXAM: CT ANGIOGRAPHY HEAD AND NECK TECHNIQUE: Multidetector CT imaging of the head and neck was performed using the standard protocol during bolus administration of intravenous contrast. Multiplanar CT image reconstructions and MIPs were obtained to evaluate the vascular anatomy. Carotid stenosis measurements (when applicable) are obtained utilizing NASCET criteria, using the distal internal carotid diameter as the denominator. CONTRAST:  50 mL Omnipaque 350 IV COMPARISON:  CTA head and neck from earlier today. FINDINGS: CTA NECK Aortic arch: 3 vessel aortic arch. Aortic arch is normal. Proximal great vessels normal. Loculated right pleural effusion with atelectasis or infiltrate in the right upper lobe. Findings are suspicious for metastatic disease versus pneumonia. Moderately large left pleural effusion. These findings are unchanged from earlier today. Stranding in the anterior mediastinal fat likely due to malignant adenopathy. Contrast injected through the left arm with significant stenosis of the left subclavian vein. Numerous venous collaterals are present due to reflux of contrast. There is reflux up the left jugular vein. Left innominate vein shows mild narrowing. Right carotid system: Right carotid is normal without evidence of stenosis or dissection. Left carotid system: Left carotid is normal without evidence of stenosis or dissection. Vertebral arteries:Both vertebral arteries patent to the basilar without significant stenosis. Skeleton: Negative for fracture. Sclerotic lesion T1 spinous process, suspicious for  metastatic disease. Disc degeneration and spondylosis C6-7. Other neck: No adenopathy in the neck. CTA HEAD Anterior circulation: Cavernous carotid widely patent bilaterally. Anterior and middle cerebral arteries patent without stenosis or occlusion. Intracranial arteries are relatively small in caliber. No aneurysm. Posterior circulation: Both vertebral arteries patent to the basilar. Basilar widely patent. Superior cerebellar and posterior cerebral arteries patent bilaterally. Fetal origin left posterior cerebral artery. No aneurysm. Venous sinuses: Patent Anatomic variants: None Delayed phase: Image quality degraded by motion. No evidence of hydrocephalus or hemorrhage. IMPRESSION: Negative for large vessel occlusion. No change from earlier today. No significant carotid or vertebral artery stenosis. Findings compatible with metastatic disease in the chest. These results were called by telephone at the time of interpretation on 08/12/2015 at 6:05 pm to Dr. Kerney Elbe , who verbally acknowledged these results. Electronically Signed   By:  Franchot Gallo M.D.   On: 08/12/2015 18:23   Ct Angio Neck W/cm &/or Wo/cm  08/12/2015  CLINICAL DATA:  50 year old female code stroke with right facial droop and confusion. Last seen normal 1000 hours. Initial encounter. Personal history of metastatic breast cancer to the brain status post whole brain radiation in November 2016. EXAM: CT ANGIOGRAPHY HEAD AND NECK TECHNIQUE: Multidetector CT imaging of the head and neck was performed using the standard protocol during bolus administration of intravenous contrast. Multiplanar CT image reconstructions and MIPs were obtained to evaluate the vascular anatomy. Carotid stenosis measurements (when applicable) are obtained utilizing NASCET criteria, using the distal internal carotid diameter as the denominator. CONTRAST:  55mL OMNIPAQUE IOHEXOL 350 MG/ML SOLN COMPARISON:  Head CT without contrast 1219 hours today reported separately.  Brain MRI 07/02/2015. FINDINGS: CTA NECK Skeleton: No destructive osseous lesion identified in the head or neck. There is a small sclerotic lesion in the T1 spinous process which is indeterminate. Other neck: Large layering left and loculated appearing right pleural effusions associated with severe compressive atelectasis in the visible lungs. Superimposed right perihilar airspace disease and irregular peri-bronchovascular opacity plus septal thickening. There is abnormal soft tissue scattered throughout the superior mediastinum suspicious for metastatic nodal disease. Diminutive thyroid. Laryngeal and pharyngeal soft tissue contours are within normal limits. Parapharyngeal and retropharyngeal spaces as well as sublingual space, submandibular glands, and parotid glands are within normal limits. Visualized orbit soft tissues are within normal limits. No cervical lymphadenopathy identified. Aortic arch: There is a left side IJ approach porta cath in place. There is associated left innominate vein stenosis. The left upper extremity was injected. Subsequently there is significant reflux of venous contrast into the left internal jugular vein and paravertebral venous system, limiting the arterial bolus. Three vessel arch configuration. No arch atherosclerosis or great vessel origin stenosis. Right carotid system: Negative. Left carotid system: Negative. Vertebral arteries: No proximal right subclavian artery stenosis. Normal right vertebral artery origin. Negative right vertebral artery to the skullbase. No proximal left subclavian artery stenosis. Normal left vertebral artery origin. DT all of the left vertebral artery limited by 8 dense paravertebral venous contrast reflux, but no evidence of left vertebral stenosis to the skullbase. CTA HEAD Posterior circulation: Distal left vertebral artery is dominant, the right functionally terminates in PICA. Normal left PICA origin. No distal left vertebral artery stenosis. That  vessel primarily supplies the basilar. No basilar artery stenosis. SCA and right PCA origins are patent. Fetal type left PCA origin. Right posterior communicating artery appears diminutive. Bilateral PCA branches are within normal limits. Anterior circulation: Both ICA siphons are patent with minimal to mild calcified plaque primarily on the right. Both carotid termini are patent. Bilateral MCA and ACA origins appear normal. Codominant A1 segments. Anterior communicating artery and proximal ACA branches are within normal limits. Right MCA M1 segment and right MCA bifurcation are patent. Proximal right MCA branches are within normal limits. Left MCA M1 segment and left MCA bifurcation are patent. No definite proximal left MCA M2 occlusion ; suboptimal arterial contrast volume limits evaluation of the second order and more distal circle of Willis branches throughout. Venous sinuses: Patent. Anatomic variants: Dominant distal left vertebral artery. Fetal type left PCA origin. IMPRESSION: 1. Negative for emergent large vessel occlusion, discussed in person with the Neurology team at 1245 hrs. 2. Suboptimal arterial contrast bolus which appears related to left innominate vein stenosis. This limits detail of the second order and distal circle of Willis branches. 3. No  arterial stenosis in the neck. 4. Abnormal chest findings with mediastinal lymphadenopathy, large layering left and large loculated appearing right pleural effusions. Right upper lobe consolidation and septal thickening. 5. No metastatic disease identified in the neck. Electronically Signed   By: Genevie Ann M.D.   On: 08/12/2015 13:22   Mr Jeri Cos [id number] Contrast  08/13/2015  CLINICAL DATA:  Acute onset of speech disturbance/expressive aphasia. Metastatic cancer. EXAM: MRI HEAD WITHOUT AND WITH CONTRAST TECHNIQUE: Multiplanar, multiecho pulse sequences of the brain and surrounding structures were obtained without and with intravenous contrast. CONTRAST:  79mL  MULTIHANCE GADOBENATE DIMEGLUMINE 529 MG/ML IV SOLN COMPARISON:  CT studies same day.  MRI 07/02/2015. FINDINGS: Diffusion imaging does not show any acute or subacute infarction. No evidence of old small or large vessel infarction. No hydrocephalus. No extra-axial collection. Since the previous study, the patient has developed a abnormal enhancement along the surfaces of the brain, extensively in the region of the cerebellar folia. This is worrisome for leptomeningeal metastatic disease. There may be a few areas of involvement in the supratentorial region. I do not identify any intraparenchymal metastases. Paranasal sinuses remain clear. Fluid again demonstrated within the mastoid air cells. IMPRESSION: No acute infarction. 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 Few small areas of involvement possible in the supratentorial region as well. Electronically Signed   By: Nelson Chimes M.D.   On: 08/13/2015 16:37     Assessment/Plan: See consult note four days ago , Patient had planned to go to [place] and wanted to delay drinage of right chest until next week. I have discussed with her and husband if they wish to proceed could do Tuesday  4/28    [name] MD 08/13/2015 6:48 PM

## 2015-08-13 NOTE — Care Management Note (Signed)
Case Management Note  Patient Details  Name: Nicole Valentine MRN: [id number] Date of Birth: [birth year]  Subjective/Objective:     Pt admitted on 08/12/15 with possible ischemic stroke s/p TPA given.  PTA, pt resided at home with spouse.  She is on continuous  home oxygen, provided by [facility].  Pt has stage IV breast cancer with mets.                Action/Plan: Met with pt and spouse to discuss dc plans.  Husband to provide care at dc.  PT recommending HH follow up, RW, and 3 in 1 BSC.  Will follow for orders.  Pt/spouse agreeable to [facility] and DME if needed.    Expected Discharge Date:                  Expected Discharge Plan:  [place]  In-House Referral:     Discharge planning Services  CM Consult  Post Acute Care Choice:    Choice offered to:     DME Arranged:    DME Agency:     HH Arranged:    [place] Agency:     Status of Service:  In process, will continue to follow  Medicare Important Message Given:    Date Medicare IM Given:    Medicare IM give by:    Date Additional Medicare IM Given:    Additional Medicare Important Message give by:     If discussed at [place] of Stay Meetings, dates discussed:    Additional Comments:  Reinaldo Raddle, RN, BSN  Trauma/Neuro ICU Case Manager [phone]

## 2015-08-13 NOTE — Progress Notes (Signed)
Initial Nutrition Assessment  DOCUMENTATION CODES:   Severe malnutrition in context of chronic illness  INTERVENTION:   Snacks TID  NUTRITION DIAGNOSIS:   Malnutrition related to chronic illness (stage IV cancer) as evidenced by severe depletion of body fat, severe depletion of muscle mass, 29 percent weight loss in 4 months.   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 She currently recieves chemo through [facility] study. 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   Medications reviewed and include: magnesium sulfate Pt and mom provide hx. Pt reports that she has lost approximately 50 lb in the last 4 months. She had regained some weight during February but switched chemo agents which decreased her appetite further. She is unable to eat any sweet foods as this tastes really bad. She is able to eat other protein foods (peanut butter, eggs, cottage cheese, cheese, meats, etc). We determined snack preferences.  Nutrition-Focused physical exam completed. Findings are mild/moderate - severe fat depletion, mild/moderate - severe muscle depletion, and mild edema in her RUE.  Pt reports losing her hair for second time with chemo. Pt's skin dry and has pale nail beds.  Encouraged follow up with outpatient RD after d/c.   Diet Order:  Diet Heart Room service appropriate?: Yes; Fluid consistency:: Thin  Skin:  Reviewed, no issues  Last BM:  unknown  Height:   Ht Readings from Last 1 Encounters:  08/11/15 5\' 7"  (1.702 m)    Weight:   Wt Readings from Last 1 Encounters:  08/11/15 128 lb 14.4 oz (58.469 kg)    Ideal Body Weight:  61.3 kg  BMI:  20.1  Estimated Nutritional Needs:   Kcal:  1700-1900  Protein:  90-110 grams  Fluid:  > 1.8  L/day  EDUCATION NEEDS:   No education needs identified at this time  [place], [place], [place] Pager [phone] After Hours Pager

## 2015-08-13 NOTE — Evaluation (Signed)
Physical Therapy Evaluation Patient Details Name: Nicole Valentine MRN: [id number] DOB: [birth year] Today's Date: 08/13/2015   History of Present Illness  pt presents with speech deficits and was given tpa with initially improvement of symptoms, but then worsening of symptoms.  CT negative for acute changes.  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  Clinical Impression  Pt generally very weak and debilitated.  Pt demonstrates poor awareness of deficits and has difficulty attending to task.  During session pt has moments where she simply stops talking mid-sentence and needs cues to regain thought/conversation.  Pt also has moments where she needs the same questions repeated between 2 and 4 times before pt attempts to answer.  During these periods of inattention, it is hard to tell if it is truly lack of attention or something else.  Pt has 24hr A from family at home and feel pt will be able to return to home with continued family support.      Follow Up Recommendations Home health PT;Supervision/Assistance - 24 hour    Equipment Recommendations  Rolling walker with 5" wheels;3in1 (PT)    Recommendations for Other Services       Precautions / Restrictions Precautions Precautions: Fall Restrictions Weight Bearing Restrictions: No      Mobility  Bed Mobility Overal bed mobility: Needs Assistance Bed Mobility: Sit to Supine       Sit to supine: Min guard   General bed mobility comments: pt able to bring herself into bed without A, but poor attention to task and lines.    Transfers Overall transfer level: Needs assistance Equipment used: Rolling walker (2 wheeled) Transfers: Sit to/from Stand Sit to Stand: Min assist         General transfer comment: pt somewhat impulsive and with poor awareness of balance deficits.    Ambulation/Gait Ambulation/Gait assistance: Min assist Ambulation Distance (Feet): 80  Feet Assistive device: Rolling walker (2 wheeled) Gait Pattern/deviations: Step-through pattern;Decreased stride length;Narrow base of support;Scissoring     General Gait Details: pt with occasional scissor step and difficulties with balance during ambulation.  pt with poor awareness of deficits and needs MinA to prevent LOB.    Stairs            Wheelchair Mobility    Modified Rankin (Stroke Patients Only) Modified Rankin (Stroke Patients Only) Pre-Morbid Rankin Score: Moderate disability Modified Rankin: Moderately severe disability     Balance Overall balance assessment: Needs assistance Sitting-balance support: No upper extremity supported;Feet supported Sitting balance-Leahy Scale: Good     Standing balance support: Bilateral upper extremity supported;During functional activity Standing balance-Leahy Scale: Poor                               Pertinent Vitals/Pain Pain Assessment: No/denies pain    Home Living Family/patient expects to be discharged to:: Private residence Living Arrangements: Spouse/significant other;Children;Parent Available Help at Discharge: Family;Available 24 hours/day Type of Home: House Home Access: Stairs to enter Entrance Stairs-Rails: Right Entrance Stairs-Number of Steps: couple Home Layout: Multi-level;Able to live on main level with bedroom/bathroom Home Equipment: Walker - 4 wheels Additional Comments: pt indicates her parents have moved in the A with caring for patient.  Difficult to determine amount of equipment pt has at home due to difficulty getting an answer from patient.      Prior Function Level of Independence: Needs assistance  Gait / Transfers Assistance Needed: pt notes sometimes she uses the rollator.    ADL's / Homemaking Assistance Needed: In the morning pt is able to perform most ADLs without A, but by the end of the day needs increased A due to fatigue.          Hand Dominance   Dominant Hand:  Right    Extremity/Trunk Assessment   Upper Extremity Assessment: Defer to OT evaluation           Lower Extremity Assessment: Generalized weakness (Decreased coordination)      Cervical / Trunk Assessment: Normal  Communication   Communication: No difficulties  Cognition Arousal/Alertness: Awake/alert Behavior During Therapy: Impulsive Overall Cognitive Status: Impaired/Different from baseline Area of Impairment: Attention;Memory;Following commands;Safety/judgement;Awareness;Problem solving   Current Attention Level: Selective Memory: Decreased short-term memory Following Commands: Follows one step commands with increased time;Follows multi-step commands inconsistently Safety/Judgement: Decreased awareness of safety;Decreased awareness of deficits Awareness: Emergent Problem Solving: Slow processing;Decreased initiation;Difficulty sequencing;Requires verbal cues;Requires tactile cues General Comments: pt with mild cognitive impairments at baseline related to memory and attention.      General Comments      Exercises        Assessment/Plan    PT Assessment Patient needs continued PT services  PT Diagnosis Difficulty walking;Generalized weakness;Altered mental status   PT Problem List Decreased strength;Decreased activity tolerance;Decreased balance;Decreased mobility;Decreased coordination;Decreased cognition;Decreased knowledge of use of DME;Decreased safety awareness;Cardiopulmonary status limiting activity  PT Treatment Interventions DME instruction;Gait training;Stair training;Functional mobility training;Therapeutic activities;Therapeutic exercise;Balance training;Neuromuscular re-education;Cognitive remediation;Patient/family education   PT Goals (Current goals can be found in the Care Plan section) Acute Rehab PT Goals Patient Stated Goal: Home PT Goal Formulation: With patient/family Time For Goal Achievement: 08/27/15 Potential to Achieve Goals: Fair     Frequency Min 4X/week   Barriers to discharge        Co-evaluation               End of Session Equipment Utilized During Treatment: Gait belt;Oxygen Activity Tolerance: Patient limited by fatigue Patient left: in bed;with call bell/phone within reach;with family/visitor present Nurse Communication: Mobility status         Time: [id number] PT Time Calculation (min) (ACUTE ONLY): 40 min   Charges:   PT Evaluation $PT Eval Moderate Complexity: 1 Procedure PT Treatments $Gait Training: 8-22 mins $Therapeutic Activity: 8-22 mins   PT G CodesCatarina Hartshorn, [place] [phone] 08/13/2015, 1:25 PM

## 2015-08-13 NOTE — Progress Notes (Signed)
STROKE TEAM PROGRESS NOTE   HISTORY OF PRESENT ILLNESS Nicole Valentine is an 50 y.o. female who has extensive cancer history. She was to go on a trip with her husband today when at 1000 08/12/2015 she was noted to suddenly have difficulty expressing herself. She was brought to [facility] hospital as a code stroke. She was noted to have difficulty expressing herself but was able to follow commands and move all extremities well. CT was negative and CTA head and neck showed no large branch occlusion. tPA was offered and husband wanted to go forward. Patient was administered IV t-PA. Afterwards, she had neuro worsening with increased expressive and receptive aphasia with worsening right sided neglect. Had stat CTA which showed no LVO.   She was admitted to the neuro ICU for further evaluation and treatment.   SUBJECTIVE (INTERVAL HISTORY) Her husband is at the bedside.  Overall she feels her condition is rapidly improving. She presented with expressive aphasia initially but subsequently became confused and some receptive difficulties as well. She was given IV TPA with initial improvement but had subsequent fluctuations in her symptoms NIH increased from 2 to 11 raising concern for new thrombosis MRI was attempted but she was not cooperative and hence CT angiogram was repeated which again did not show large vessel occlusion.Thsi am NIH is 0   OBJECTIVE Temp:  [97.3 F (36.3 C)-98.6 F (37 C)] 98.4 F (36.9 C) (03/24 0800) Pulse Rate:  [95-150] 108 (03/24 0700) Cardiac Rhythm:  [-] Sinus tachycardia (03/24 0600) Resp:  [6-38] 23 (03/24 0700) BP: (113-188)/(58-161) 165/91 mmHg (03/24 0700) SpO2:  [84 %-100 %] 100 % (03/24 0600)  CBC:  Recent Labs Lab 08/10/15 1428 08/12/15 1218 08/12/15 1221  WBC 7.4 7.2  --   NEUTROABS 6.3 5.9  --   HGB 10.3* 10.3* 11.2*  HCT 31.1* 31.8* 33.0*  MCV 105.3* 105.0*  --   PLT 323 264  --     Basic Metabolic Panel:  Recent Labs Lab 08/10/15 1427 08/10/15 1428  08/12/15 1218 08/12/15 1221  NA  --  138 136 133*  K  --  3.7 4.1 3.9  CL  --   --  93* 88*  CO2  --  34* 28  --   GLUCOSE  --  108 102* 103*  BUN  --  13.5 15 16   CREATININE  --  0.6 0.54 0.50  CALCIUM  --  9.4 9.0  --   MG  --  1.2*  --   --   PHOS 4.0  --   --   --     Lipid Panel:    Component Value Date/Time   CHOL 175 08/13/2015 0455   TRIG 131 08/13/2015 0455   HDL 24* 08/13/2015 0455   CHOLHDL 7.3 08/13/2015 0455   VLDL 26 08/13/2015 0455   LDLCALC 125* 08/13/2015 0455   HgbA1c: No results found for: HGBA1C Urine Drug Screen:    Component Value Date/Time   LABOPIA POSITIVE* 08/12/2015 1451   COCAINSCRNUR NONE DETECTED 08/12/2015 1451   LABBENZ NONE DETECTED 08/12/2015 1451   AMPHETMU NONE DETECTED 08/12/2015 1451   THCU NONE DETECTED 08/12/2015 1451   LABBARB NONE DETECTED 08/12/2015 1451      IMAGING  Ct Angio Head W/cm &/or Wo Cm  08/12/2015   Negative for large vessel occlusion. No change from earlier today. No significant carotid or vertebral artery stenosis. Findings compatible with metastatic disease in the chest.   08/12/2015   1. Negative for  emergent large vessel occlusion, discussed in person with the Neurology team at 1245 hrs. 2. Suboptimal arterial contrast bolus which appears related to left innominate vein stenosis. This limits detail of the second order and distal circle of Willis branches. 3. No arterial stenosis in the neck. 4. Abnormal chest findings with mediastinal lymphadenopathy, large layering left and large loculated appearing right pleural effusions. Right upper lobe consolidation and septal thickening. 5. No metastatic disease identified in the neck.   Ct Head Wo Contrast  08/12/2015  Negative CT of the head. Negative for intracranial hemorrhage. No acute infarct.   08/12/2015   No acute intracranial pathology.   Ct Angio Neck W/cm &/or Wo/cm  08/12/2015  Negative for large vessel occlusion. No change from earlier today. No significant  carotid or vertebral artery stenosis. Findings compatible with metastatic disease in the chest.  08/12/2015  1. Negative for emergent large vessel occlusion, discussed in person with the Neurology team at 1245 hrs. 2. Suboptimal arterial contrast bolus which appears related to left innominate vein stenosis. This limits detail of the second order and distal circle of Willis branches. 3. No arterial stenosis in the neck. 4. Abnormal chest findings with mediastinal lymphadenopathy, large layering left and large loculated appearing right pleural effusions. Right upper lobe consolidation and septal thickening. 5. No metastatic disease identified in the neck.   2D Echocardiogram   - Left ventricle: The cavity size was normal. Wall thickness as  normal. The estimated ejection fraction was 55%. Wall motion was normal; there were no regional wall motion abnormalities. Indeterminant diastolic function. - Aortic valve: There was no stenosis. - Mitral valve: There was no significant regurgitation. - Right ventricle: The cavity size was normal. Systolic function was normal. - Pulmonary arteries: No complete TR doppler jet so unable to estimate PA systolic pressure. - Systemic veins: The IVC was not visualized. - Pericardium, extracardiac: A small circumferential pericardial effusion was present. Impressions:    Normal LV size with EF 55%. Normal RV size and systolic function. No significant valvular abnormality. Small circumferential pericardial effusion.   PHYSICAL EXAM Pleasant middle aged caucasian lady not in distress. . Afebrile. Head is nontraumatic. Neck is supple without bruit.    Cardiac exam no murmur or gallop. Lungs are clear to auscultation. Distal pulses are well felt. Neurological Exam ;  Awake  Alert oriented x 3. Normal speech and language.Recall 3/3. Able to name , repeat and comprehend well. Speech is fluenteye movements full without nystagmus.fundi were not visualized. Vision acuity and  fields appear normal. Hearing is normal. Palatal movements are normal. Face symmetric. Tongue midline. Normal strength, tone, reflexes and coordination. Normal sensation. Gait deferred. NIHSS 0 ASSESSMENT/PLAN Ms. Aryel Petrelli is a 50 y.o. female with extensive cancer history presenting with expressive aphasia. She received IV t-PA 08/12/2015 at 1252. CTA d/t neuro worsening post tPA administration with no LVO.   Stroke:  Left MCA branch infarct versus stroke mimic- seizure  Resultant  No deficts full recovery after iv TPA  CTA no large vessel occlusion  Repeat CTA with no LVO  CTA neck no large vessel occlusion  Repeat CTA neck with no large vessel occlusion  2D Echo done 08/04/2015  No source of embolus   MRI  With and without contract  pending  Check EEG  LDL 125  HgbA1c pending  SCDs for VTE prophylaxis  Diet Heart Room service appropriate?: Yes; Fluid consistency:: Thin  No antithrombotic prior to admission, now on No antithrombotic as within 24h post  tPA. Plan aspirin if post tPA imaging neg for hemorrhage  Ongoing aggressive stroke risk factor management  Therapy recommendations:  Pending. Ok to be OOB  Disposition:  pending   Hyperlipidemia  Home meds:  No statin  LDL 125, goal < 70  Add statin    Other Stroke Risk Factors  Breast cancer with lung and brain mets  Other Active Problems  Hypothyroid, on replacement   Hospital day # 1  I have personally examined this patient, reviewed notes, independently viewed imaging studies, participated in medical decision making and plan of care. I have made any additions or clarifications directly to the above note. Agree with note above. She presented with sudden onset of difficulty expressing herself ordered by some confusion and disorientation possibilities include left MCA branch infarct versus complex partial seizure. She was treated with IV TPA and has shown significant improvement. It is unclear at the  present time whether this is a stroke clinic versus small left MCA branch infarct. She remains at risk for neurological worsening, recurrent stroke, TIA, seizures and needs ongoing evaluation. I had a long discussion the patient and husband and the bedside and answered questions about her care. Check MRI scan of the brain with and without contrast to evaluate for metastasis and EEG for seizure activity. This patient is critically ill and at significant risk of neurological worsening, death and care requires constant monitoring of vital signs, hemodynamics,respiratory and cardiac monitoring, extensive review of multiple databases, frequent neurological assessment, discussion with family, other specialists and medical decision making of high complexity.I have made any additions or clarifications directly to the above note.This critical care time does not reflect procedure time, or teaching time or supervisory time of PA/NP/Med Resident etc but could involve care discussion time.  I spent 50 minutes of neurocritical care time  in the care of  this patient.      Antony Contras, MD Medical Director [place] Pager: [phone] 08/13/2015 4:51 PM     To contact Stroke Continuity provider, please refer to [email or web address] After hours, contact General Neurology

## 2015-08-13 NOTE — Progress Notes (Signed)
OT Cancellation Note  Patient Details Name: Nicole Valentine MRN: [id number] DOB: [birth year]   Cancelled Treatment:    Reason Eval/Treat Not Completed: Patient not medically ready - Pt with strict bedrest orders.    Darlina Rumpf [place], OTR/L [id number]  08/13/2015, 9:52 AM

## 2015-08-13 NOTE — Evaluation (Signed)
Occupational Therapy Evaluation Patient Details Name: Nicole Valentine MRN: [id number] DOB: 1965-07-07 Today's Date: 08/13/2015    History of Present Illness pt presents with speech deficits and was given tpa with initially improvement of symptoms, but then worsening of symptoms.  CT negative for acute changes.  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  Pt is status post full brain radiation    Clinical Impression   Pt admitted with above. She demonstrates the below listed deficits and will benefit from continued OT to maximize safety and independence with BADLs.  Pt presents to OT with impaired cognition (unsure how far off of baseline she is), impaired vision, generalized weakness, and impaired balance.  Pt with one episode of staring off into space, requiring redirection to focus on task.   She has good family support at home.  Recommend HHOT.       Follow Up Recommendations  Home health OT;Supervision/Assistance - 24 hour    Equipment Recommendations       Recommendations for Other Services       Precautions / Restrictions Precautions Precautions: Fall      Mobility Bed Mobility Overal bed mobility: Needs Assistance Bed Mobility: Sit to Supine       Sit to supine: Min guard   General bed mobility comments: pt able to bring herself into bed without A, but poor attention to task and lines.    Transfers                 General transfer comment: Pt too fatigued during OT session     Balance                                            ADL Overall ADL's : Needs assistance/impaired Eating/Feeding: Independent;Sitting   Grooming: Wash/dry hands;Wash/dry face;Oral care;Brushing hair;Set up;Sitting   Upper Body Bathing: Minimal assitance;Sitting;Bed level   Lower Body Bathing: Moderate assistance;Bed level   Upper Body Dressing : Moderate assistance;Bed level;Sitting    Lower Body Dressing: Maximal assistance;Bed level   Toilet Transfer: Minimal assistance;Ambulation;Comfort height toilet;BSC   Toileting- Clothing Manipulation and Hygiene: Minimal assistance;Sit to/from stand       Functional mobility during ADLs: Minimal assistance General ADL Comments: Pt very fatigued during OT eval      Vision Vision Assessment?: Yes Eye Alignment: Within Functional Limits Ocular Range of Motion: Within Functional Limits Tracking/Visual Pursuits: Able to track stimulus in all quads without difficulty Visual Fields: No apparent deficits Additional Comments: Pt with decreased reading speed and with minimal errors    Perception Perception Perception Tested?: Yes   Praxis Praxis Praxis tested?: Within functional limits    Pertinent Vitals/Pain Pain Assessment: No/denies pain     Hand Dominance Right   Extremity/Trunk Assessment Upper Extremity Assessment Upper Extremity Assessment: Generalized weakness (Lt UE 4/5; Rt UE 4+/5)   Lower Extremity Assessment Lower Extremity Assessment: Defer to PT evaluation   Cervical / Trunk Assessment Cervical / Trunk Assessment: Normal   Communication Communication Communication: No difficulties   Cognition Arousal/Alertness: Awake/alert Behavior During Therapy: WFL for tasks assessed/performed (mildly irritable ) Overall Cognitive Status: Impaired/Different from baseline Area of Impairment: Attention;Memory;Following commands;Safety/judgement;Awareness;Problem solving   Current Attention Level: Selective Memory: Decreased short-term memory Following Commands: Follows one step commands with increased time;Follows multi-step commands inconsistently Safety/Judgement: Decreased awareness  of safety;Decreased awareness of deficits Awareness: Emergent Problem Solving: Slow processing;Decreased initiation;Difficulty sequencing;Requires verbal cues;Requires tactile cues General Comments: Pt with h/o whole brain  radiation and subsequent cognitive deficits.      General Comments       Exercises       Shoulder Instructions      Home Living Family/patient expects to be discharged to:: Private residence Living Arrangements: Spouse/significant other;Children;Parent Available Help at Discharge: Family;Available 24 hours/day Type of Home: House Home Access: Stairs to enter [employer] of Steps: couple Entrance Stairs-Rails: Right Home Layout: Multi-level;Able to live on main level with bedroom/bathroom     Bathroom Shower/Tub: Occupational psychologist: Standard     Home Equipment: Environmental consultant - 4 wheels;Shower seat - built in;Grab bars - tub/shower   Additional Comments: pt indicates her parents have moved in the A with caring for patient.  Difficult to determine amount of equipment pt has at home due to difficulty getting an answer from patient.        Prior Functioning/Environment Level of Independence: Needs assistance  Gait / Transfers Assistance Needed: pt notes sometimes she uses the rollator.   ADL's / Homemaking Assistance Needed: In the morning pt is able to perform most ADLs without A, but by the end of the day needs increased A due to fatigue.          OT Diagnosis: Generalized weakness;Cognitive deficits;Disturbance of vision   OT Problem List: Decreased strength;Decreased activity tolerance;Impaired balance (sitting and/or standing);Impaired vision/perception;Decreased cognition;Decreased safety awareness;Decreased knowledge of use of DME or AE   OT Treatment/Interventions: Self-care/ADL training;Therapeutic exercise;Energy conservation;DME and/or AE instruction;Therapeutic activities;Cognitive remediation/compensation;Visual/perceptual remediation/compensation;Patient/family education;Balance training    OT Goals(Current goals can be found in the care plan section) Acute Rehab OT Goals Patient Stated Goal: Home OT Goal Formulation: With patient Time For  Goal Achievement: 08/27/15 Potential to Achieve Goals: Good ADL Goals Pt Will Perform Grooming: with supervision;standing Pt Will Perform Upper Body Bathing: with set-up;sitting Pt Will Perform Lower Body Bathing: with supervision;sit to/from stand Pt Will Perform Upper Body Dressing: with set-up;sitting Pt Will Perform Lower Body Dressing: with supervision;sit to/from stand Pt Will Transfer to Toilet: with supervision;ambulating;regular height toilet;bedside commode;grab bars Pt Will Perform Toileting - Clothing Manipulation and hygiene: with supervision;sit to/from stand Pt Will Perform Tub/Shower Transfer: Shower transfer;with supervision;ambulating;shower seat;grab bars Additional ADL Goal #1: Pt will be able to read full page of info with no more than 2 errors.   OT Frequency: Min 2X/week   Barriers to D/C:            Co-evaluation              End of Session    Activity Tolerance: Patient limited by fatigue Patient left: in bed;with call bell/phone within reach;with family/visitor present   Time: [id number] OT Time Calculation (min): 20 min Charges:  OT General Charges $OT Visit: 1 Procedure OT Evaluation $OT Eval Moderate Complexity: 1 Procedure G-Codes:    Nicole Valentine M 02-Sep-2015, 7:05 PM

## 2015-08-13 NOTE — Progress Notes (Signed)
PT Cancellation Note  Patient Details Name: Nicole Valentine MRN: [id number] DOB: 15-Feb-1966   Cancelled Treatment:    Reason Eval/Treat Not Completed: Patient not medically ready.  Pt currently with active strict bedrest order post-tpa.  Please advance activity order once appropriate for PT and mobility.     Catarina Hartshorn, [place] 08/13/2015, 8:12 AM

## 2015-08-13 NOTE — Procedures (Signed)
ELECTROENCEPHALOGRAM REPORT  Patient: Nicole Valentine       Room #: [id number] EEG No. ID: [id number] Age: 50 y.o.        Sex: female Referring Physician: Royal Hawthorn Report Date:  08/13/2015        Interpreting Physician: Anthony Sar  History: Nicole Valentine is an 50 y.o. female with a history of malignancy who presented on 08/12/2015 with sudden onset of speech output difficulty. She was treated with IV TPA for possible acute stroke. MRI showed no signs of acute stroke, but showed evidence of leptomeningeal temporal metastasis.  Indications for study:  Rule out encephalopathy; rule out seizure activity.  Technique: This is an 18 channel routine scalp EEG performed at the bedside with bipolar and monopolar montages arranged in accordance to the international 10/20 system of electrode placement.   Description: This EEG recording was performed during wakefulness and light sleep. The dominant background activity consisted of diffuse low amplitude irregular delta activity with superimposed 7-9 Hz rhythmic activity which was most prominent posteriorly. Photic stimulation was not performed. During light sleep though was further slowing of background activity as well as occasional runs of sleep spindles. No epileptiform discharges were recorded.  Interpretation: This EEG is abnormal with mild generalized nonspecific slowing of cerebral activity. No evidence of seizure activity was recorded. However, the absence of seizure activity on an interictal EEG recording does not rule out seizure disorder.  Nicole Valentine M.D. [facility] [phone]

## 2015-08-13 NOTE — Evaluation (Signed)
Speech Language Pathology Evaluation Patient Details Name: Nicole Valentine MRN: [id number] DOB: 11-26-1965 Today's Date: 08/13/2015 Time: [id number] SLP Time Calculation (min) (ACUTE ONLY): 21 min  Problem List:  Patient Active Problem List   Diagnosis Date Noted  . Acute ischemic stroke ([place]) 08/12/2015  . CVA (cerebral infarction) 08/12/2015  . Constipation 08/03/2015  . Hypomagnesemia 08/03/2015  . Hypoalbuminemia due to protein-calorie malnutrition ([place]) 08/03/2015  . Genetic testing 07/01/2015  . Drug-induced neutropenia ([place]) 04/28/2015  . Malnutrition of moderate degree 04/20/2015  . Pneumoperitoneum 04/19/2015  . Chronic rhinitis 04/05/2015  . Coughing 04/05/2015  . History of food allergy 04/05/2015  . Cancer associated pain 03/02/2015  . Bone metastases ([place]) 01/15/2015  . Acute respiratory failure with hypoxia ([place]) 11/18/2014  . 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 SIRS (systemic inflammatory response syndrome) ([place]) 11/18/2014  . Brain metastases ([place]) 11/16/2014  . Breast cancer metastasized to bone ([place]) 11/07/2014  . Breast cancer metastasized to multiple sites ([place]) 11/07/2014  . Mass of neck 09/25/2014  . Lymphedema of arm 10/03/2013  . Knee pain, right 10/03/2013  . GERD (gastroesophageal reflux disease) 10/03/2013  . History of breast cancer 10/03/2013  . Breast cancer of upper-outer quadrant of right female breast ([place]) 01/28/2013  . 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 Complication of anesthesia     her father and uncle both had very difficult time waking up-was  something they told them may be hereditory. she will find out.  [name] Hypothyroidism   . 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 Radiation 01/21/13-03/06/13    Right Breast  . Breast cancer ([place]) dx'd 04-10-12-rt   Past Surgical History:  Past Surgical History  Procedure Laterality Date  . Wisdom tooth extraction    . Portacath  placement  04/22/2012    Procedure: INSERTION PORT-A-CATH;  Surgeon: [name], MD;  Location: [facility];  Service: General;  Laterality: Left;  [name] Modified mastectomy Right 11/18/2012    Procedure:  RIGHT MODIFIED MASTECTOMY;  Surgeon: [name], MD;  Location: [facility];  Service: General;  Laterality: Right;  . Knee arthroscopy Right 11/26/2013  . Port-a-cath removal Left 12/10/2013    Procedure: MINOR REMOVAL PORT-A-CATH;  Surgeon: [name], MD;  Location: [facility];  Service: General;  Laterality: Left;   HPI:  50 y.o. female with h/o breast cancer with subsequent radiation and metastases, who presented to ED after sudden expressive speech difficulty. Pt brought to [facility] as a code stroke. tPA administered. Per neurology note,  pt with worsening receptive and expressive aphasia with R side neglect on exam. CT Head 3/23 negative for intracranial hemorrhage and no acute infarct.   Assessment / Plan / Recommendation Clinical Impression  SLP administered Western Aphasia Battery Bedside and 2 subtests of the [facility] standardized assessments to evaluate pt's speech, language and cognition. Pt exhibited word finding deficits during conversational speech including minimal phonemic paraphasias. Mild-moderate cognitive impairments present including attention (sustained, selective and divided), problem solving and executive functioning abilities. Deficits which husband reports is worse than baseline, however much improved from yesterday. Pt required mod verbal cues to attend during conversation. Pt and husband educated re: results of evaluation and need for continued SLP intervention. SLP will follow to provide cognitive tx.    SLP Assessment  Patient needs continued Speech Lanaguage Pathology Services    Follow Up Recommendations   (TBD)  Frequency and Duration min 2x/week  2 weeks      SLP Evaluation Prior Functioning   Cognitive/Linguistic Baseline: Within functional limits Type of Home: House  Lives With: Spouse;Family;Daughter;Son Education: 4 year college   Cognition  Overall Cognitive Status: Impaired/Different from baseline Arousal/Alertness: Awake/alert Orientation Level: Oriented X4 Attention: Sustained;Divided;Selective Sustained Attention: Appears intact Selective Attention: Impaired Selective Attention Impairment: Verbal basic;Functional basic Divided Attention: Impaired Divided Attention Impairment: Verbal basic;Functional basic Memory: Impaired (informal assessment) Memory Impairment: Decreased recall of new information Awareness: Appears intact Problem Solving: Appears intact Executive Function: Self Correcting;Reasoning;Organizing Reasoning: Impaired Reasoning Impairment: Verbal basic;Functional basic Organizing: Impaired Organizing Impairment: Verbal basic;Functional basic Self Correcting: Appears intact Safety/Judgment: Appears intact    Comprehension  Auditory Comprehension Overall Auditory Comprehension: Appears within functional limits for tasks assessed Yes/No Questions: Within Functional Limits Commands: Within Functional Limits Conversation: Simple Interfering Components: Attention;Processing speed;Working memory EffectiveTechniques: Repetition;Extra processing time Visual Recognition/Discrimination Discrimination: Not tested Reading Comprehension Reading Status: Within funtional limits    Expression Expression Primary Mode of Expression: Verbal Verbal Expression Overall Verbal Expression: Impaired Initiation: No impairment Level of Generative/Spontaneous Verbalization: Conversation Repetition: No impairment Naming: Impairment Responsive: 51-75% accurate Confrontation: Within functional limits Verbal Errors: Aware of errors;Phonemic paraphasias Pragmatics: No impairment Interfering Components: Attention Non-Verbal Means of Communication: Not  applicable Written Expression Dominant Hand: Right Written Expression: Not tested   Oral / Motor  Oral Motor/Sensory Function Overall Oral Motor/Sensory Function: Within functional limits Motor Speech Overall Motor Speech: Appears within functional limits for tasks assessed Respiration: Within functional limits Phonation: Normal Resonance: Within functional limits Articulation: Within functional limitis Intelligibility: Intelligible Motor Planning: Witnin functional limits Motor Speech Errors: Not applicable   Titus Mould, Student-SLP                     Titus Mould 08/13/2015, 12:53 PM

## 2015-08-13 NOTE — Progress Notes (Signed)
Nicole Valentine   DOB:November 25, 1965   DG#:[id number]   RJJ#:[id number]  Subjective: Harlyn was able to give me a coherent account of events yesterday AM to the point at which she became confused and dysarthric. Husband Ramsay filled out the rest of the pictur.--She appears to have tolerated the tPA w/o any side effects or complications and specifically reports no bleeding or bruising. Panicked in MRI yesterday but was confused at the time-- feels she could easily undergo that test w/o premeds if that is the plan.   Objective: middle aged White woman examined in bed Filed Vitals:   08/13/15 0600 08/13/15 0700  BP: 160/90 165/91  Pulse: 111 108  Temp:    Resp: 22 23    There is no weight on file to calculate BMI.  Intake/Output Summary (Last 24 hours) at 08/13/15 0803 Last data filed at 08/13/15 0700  Gross per 24 hour  Intake    900 ml  Output    700 ml  Net    200 ml     Sclerae unicteric  No peripheral adenopathy  Lungs clear -- auscultated anterolaterally  Heart regular rate and rhythm  Abdomen benign  Neuro facial muscles symmetrical, speech clear and coherent, A&O x3, motor nonfocal  Breast exam: deferred  CBG (last 3)  No results for input(s): GLUCAP in the last 72 hours.   Labs:  Lab Results  Component Value Date   WBC 7.2 08/12/2015   HGB 11.2* 08/12/2015   HCT 33.0* 08/12/2015   MCV 105.0* 08/12/2015   PLT 264 08/12/2015   NEUTROABS 5.9 08/12/2015    _0 @  Urine Studies No results for input(s): UHGB, CRYS in the last 72 hours.  Invalid input(s): UACOL, UAPR, USPG, UPH, UTP, UGL, UKET, UBIL, UNIT, UROB, ULEU, UEPI, UWBC, URBC, UBAC, CAST, [place], [place]  Basic Metabolic Panel:  Recent Labs Lab 08/10/15 1427 08/10/15 1428 08/12/15 1218 08/12/15 1221  NA  --  138 136 133*  K  --  3.7 4.1 3.9  CL  --   --  93* 88*  CO2  --  34* 28  --   GLUCOSE  --  108 102* 103*  BUN  --  13._1 CREATININE  --  0.6 0.54 0.50  CALCIUM  --  9.4 9.0  --    MG  --  1.2*  --   --   PHOS 4.0  --   --   --    GFR Estimated Creatinine Clearance: 78.6 mL/min (by C-G formula based on Cr of 0.5). Liver Function Tests:  Recent Labs Lab 08/10/15 1428 08/12/15 1218  AST 17 25  ALT <9 9*  ALKPHOS 105 99  BILITOT 0.59 0.9  PROT 6.6 6.1*  ALBUMIN 2.4* 2.4*   No results for input(s): LIPASE, AMYLASE in the last 168 hours. No results for input(s): AMMONIA in the last 168 hours. Coagulation profile  Recent Labs Lab 08/12/15 1218  INR 1.34    CBC:  Recent Labs Lab 08/10/15 1428 08/12/15 1218 08/12/15 1221  WBC 7.4 7.2  --   NEUTROABS 6.3 5.9  --   HGB 10.3* 10.3* 11.2*  HCT 31.1* 31.8* 33.0*  MCV 105.3* 105.0*  --   PLT 323 264  --    Cardiac Enzymes: No results for input(s): CKTOTAL, CKMB, CKMBINDEX, TROPONINI in the last 168 hours. BNP: Invalid input(s): POCBNP CBG: No results for input(s): GLUCAP in the last 168 hours. D-Dimer No results for input(s): DDIMER in the last  72 hours. Hgb A1c No results for input(s): HGBA1C in the last 72 hours. Lipid Profile  Recent Labs  08/13/15 0455  CHOL 175  HDL 24*  LDLCALC 125*  TRIG 131  CHOLHDL 7.3   Thyroid function studies  Recent Labs  08/10/15 1428  TSH 7.361*   Anemia work up No results for input(s): VITAMINB12, FOLATE, FERRITIN, TIBC, IRON, RETICCTPCT in the last 72 hours. Microbiology Recent Results (from the past 240 hour(s))  MRSA PCR Screening     Status: None   Collection Time: 08/12/15  2:39 PM  Result Value Ref Range Status   MRSA by PCR NEGATIVE NEGATIVE Final    Comment:        The GeneXpert MRSA Assay (FDA approved for NASAL specimens only), is one component of a comprehensive MRSA colonization surveillance program. It is not intended to diagnose MRSA infection nor to guide or monitor treatment for MRSA infections.       Studies:  Ct Angio Head W/cm &/or Wo Cm  08/12/2015  CLINICAL DATA:  Stroke. Change in mental status following IV  tPA. History of breast cancer with metastatic disease. EXAM: CT ANGIOGRAPHY HEAD AND NECK TECHNIQUE: Multidetector CT imaging of the head and neck was performed using the standard protocol during bolus administration of intravenous contrast. Multiplanar CT image reconstructions and MIPs were obtained to evaluate the vascular anatomy. Carotid stenosis measurements (when applicable) are obtained utilizing NASCET criteria, using the distal internal carotid diameter as the denominator. CONTRAST:  50 mL Omnipaque 350 IV COMPARISON:  CTA head and neck from earlier today. FINDINGS: CTA NECK Aortic arch: 3 vessel aortic arch. Aortic arch is normal. Proximal great vessels normal. Loculated right pleural effusion with atelectasis or infiltrate in the right upper lobe. Findings are suspicious for metastatic disease versus pneumonia. Moderately large left pleural effusion. These findings are unchanged from earlier today. Stranding in the anterior mediastinal fat likely due to malignant adenopathy. Contrast injected through the left arm with significant stenosis of the left subclavian vein. Numerous venous collaterals are present due to reflux of contrast. There is reflux up the left jugular vein. Left innominate vein shows mild narrowing. Right carotid system: Right carotid is normal without evidence of stenosis or dissection. Left carotid system: Left carotid is normal without evidence of stenosis or dissection. Vertebral arteries:Both vertebral arteries patent to the basilar without significant stenosis. Skeleton: Negative for fracture. Sclerotic lesion T1 spinous process, suspicious for metastatic disease. Disc degeneration and spondylosis C6-7. Other neck: No adenopathy in the neck. CTA HEAD Anterior circulation: Cavernous carotid widely patent bilaterally. Anterior and middle cerebral arteries patent without stenosis or occlusion. Intracranial arteries are relatively small in caliber. No aneurysm. Posterior circulation:  Both vertebral arteries patent to the basilar. Basilar widely patent. Superior cerebellar and posterior cerebral arteries patent bilaterally. Fetal origin left posterior cerebral artery. No aneurysm. Venous sinuses: Patent Anatomic variants: None Delayed phase: Image quality degraded by motion. No evidence of hydrocephalus or hemorrhage. IMPRESSION: Negative for large vessel occlusion. No change from earlier today. No significant carotid or vertebral artery stenosis. Findings compatible with metastatic disease in the chest. These results were called by telephone at the time of interpretation on 08/12/2015 at 6:05 pm to Dr. Kerney Elbe , who verbally acknowledged these results. Electronically Signed   By: Franchot Gallo M.D.   On: 08/12/2015 18:23   Ct Angio Head W/cm &/or Wo Cm  08/12/2015  CLINICAL DATA:  50 year old female code stroke with right facial droop and confusion. Last  seen normal 1000 hours. Initial encounter. Personal history of metastatic breast cancer to the brain status post whole brain radiation in November 2016. EXAM: CT ANGIOGRAPHY HEAD AND NECK TECHNIQUE: Multidetector CT imaging of the head and neck was performed using the standard protocol during bolus administration of intravenous contrast. Multiplanar CT image reconstructions and MIPs were obtained to evaluate the vascular anatomy. Carotid stenosis measurements (when applicable) are obtained utilizing NASCET criteria, using the distal internal carotid diameter as the denominator. CONTRAST:  72m OMNIPAQUE IOHEXOL 350 MG/ML SOLN COMPARISON:  Head CT without contrast 1219 hours today reported separately. Brain MRI 07/02/2015. FINDINGS: CTA NECK Skeleton: No destructive osseous lesion identified in the head or neck. There is a small sclerotic lesion in the T1 spinous process which is indeterminate. Other neck: Large layering left and loculated appearing right pleural effusions associated with severe compressive atelectasis in the visible  lungs. Superimposed right perihilar airspace disease and irregular peri-bronchovascular opacity plus septal thickening. There is abnormal soft tissue scattered throughout the superior mediastinum suspicious for metastatic nodal disease. Diminutive thyroid. Laryngeal and pharyngeal soft tissue contours are within normal limits. Parapharyngeal and retropharyngeal spaces as well as sublingual space, submandibular glands, and parotid glands are within normal limits. Visualized orbit soft tissues are within normal limits. No cervical lymphadenopathy identified. Aortic arch: There is a left side IJ approach porta cath in place. There is associated left innominate vein stenosis. The left upper extremity was injected. Subsequently there is significant reflux of venous contrast into the left internal jugular vein and paravertebral venous system, limiting the arterial bolus. Three vessel arch configuration. No arch atherosclerosis or great vessel origin stenosis. Right carotid system: Negative. Left carotid system: Negative. Vertebral arteries: No proximal right subclavian artery stenosis. Normal right vertebral artery origin. Negative right vertebral artery to the skullbase. No proximal left subclavian artery stenosis. Normal left vertebral artery origin. DT all of the left vertebral artery limited by 8 dense paravertebral venous contrast reflux, but no evidence of left vertebral stenosis to the skullbase. CTA HEAD Posterior circulation: Distal left vertebral artery is dominant, the right functionally terminates in PICA. Normal left PICA origin. No distal left vertebral artery stenosis. That vessel primarily supplies the basilar. No basilar artery stenosis. SCA and right PCA origins are patent. Fetal type left PCA origin. Right posterior communicating artery appears diminutive. Bilateral PCA branches are within normal limits. Anterior circulation: Both ICA siphons are patent with minimal to mild calcified plaque primarily on  the right. Both carotid termini are patent. Bilateral MCA and ACA origins appear normal. Codominant A1 segments. Anterior communicating artery and proximal ACA branches are within normal limits. Right MCA M1 segment and right MCA bifurcation are patent. Proximal right MCA branches are within normal limits. Left MCA M1 segment and left MCA bifurcation are patent. No definite proximal left MCA M2 occlusion ; suboptimal arterial contrast volume limits evaluation of the second order and more distal circle of Willis branches throughout. Venous sinuses: Patent. Anatomic variants: Dominant distal left vertebral artery. Fetal type left PCA origin. IMPRESSION: 1. Negative for emergent large vessel occlusion, discussed in person with the Neurology team at 1245 hrs. 2. Suboptimal arterial contrast bolus which appears related to left innominate vein stenosis. This limits detail of the second order and distal circle of Willis branches. 3. No arterial stenosis in the neck. 4. Abnormal chest findings with mediastinal lymphadenopathy, large layering left and large loculated appearing right pleural effusions. Right upper lobe consolidation and septal thickening. 5. No metastatic disease identified  in the neck. Electronically Signed   By: Genevie Ann M.D.   On: 08/12/2015 13:22   Ct Head Wo Contrast  08/12/2015  CLINICAL DATA:  Mental status change.  Post tPA. EXAM: CT HEAD WITHOUT CONTRAST TECHNIQUE: Contiguous axial images were obtained from the base of the skull through the vertex without intravenous contrast. COMPARISON:  CT head 08/12/2015 FINDINGS: Ventricle size is normal. Negative for acute or chronic infarction. Negative for hemorrhage or fluid collection. Negative for mass or edema. No shift of the midline structures. Calvarium is intact. IMPRESSION: Negative CT of the head. Negative for intracranial hemorrhage. No acute infarct. Electronically Signed   By: Franchot Gallo M.D.   On: 08/12/2015 15:45   Ct Head Wo  Contrast  08/12/2015  CLINICAL DATA:  Code stroke, right facial droop EXAM: CT HEAD WITHOUT CONTRAST TECHNIQUE: Contiguous axial images were obtained from the base of the skull through the vertex without intravenous contrast. COMPARISON:  None. FINDINGS: There is no evidence of mass effect, midline shift or extra-axial fluid collections. There is no evidence of a space-occupying lesion or intracranial hemorrhage. There is no evidence of a cortical-based area of acute infarction. The ventricles and sulci are appropriate for the patient's age. The basal cisterns are patent. Visualized portions of the orbits are unremarkable. The visualized portions of the paranasal sinuses and mastoid air cells are unremarkable. The osseous structures are unremarkable. IMPRESSION: No acute intracranial pathology. These results were called by telephone at the time of interpretation on 08/12/2015 at 12:30 pm to Dr. Cheral Marker, who verbally acknowledged these results. Electronically Signed   By: Kathreen Devoid   On: 08/12/2015 12:51   Ct Angio Neck W/cm &/or Wo/cm  08/12/2015  CLINICAL DATA:  Stroke. Change in mental status following IV tPA. History of breast cancer with metastatic disease. EXAM: CT ANGIOGRAPHY HEAD AND NECK TECHNIQUE: Multidetector CT imaging of the head and neck was performed using the standard protocol during bolus administration of intravenous contrast. Multiplanar CT image reconstructions and MIPs were obtained to evaluate the vascular anatomy. Carotid stenosis measurements (when applicable) are obtained utilizing NASCET criteria, using the distal internal carotid diameter as the denominator. CONTRAST:  50 mL Omnipaque 350 IV COMPARISON:  CTA head and neck from earlier today. FINDINGS: CTA NECK Aortic arch: 3 vessel aortic arch. Aortic arch is normal. Proximal great vessels normal. Loculated right pleural effusion with atelectasis or infiltrate in the right upper lobe. Findings are suspicious for metastatic disease  versus pneumonia. Moderately large left pleural effusion. These findings are unchanged from earlier today. Stranding in the anterior mediastinal fat likely due to malignant adenopathy. Contrast injected through the left arm with significant stenosis of the left subclavian vein. Numerous venous collaterals are present due to reflux of contrast. There is reflux up the left jugular vein. Left innominate vein shows mild narrowing. Right carotid system: Right carotid is normal without evidence of stenosis or dissection. Left carotid system: Left carotid is normal without evidence of stenosis or dissection. Vertebral arteries:Both vertebral arteries patent to the basilar without significant stenosis. Skeleton: Negative for fracture. Sclerotic lesion T1 spinous process, suspicious for metastatic disease. Disc degeneration and spondylosis C6-7. Other neck: No adenopathy in the neck. CTA HEAD Anterior circulation: Cavernous carotid widely patent bilaterally. Anterior and middle cerebral arteries patent without stenosis or occlusion. Intracranial arteries are relatively small in caliber. No aneurysm. Posterior circulation: Both vertebral arteries patent to the basilar. Basilar widely patent. Superior cerebellar and posterior cerebral arteries patent bilaterally. Fetal origin left  posterior cerebral artery. No aneurysm. Venous sinuses: Patent Anatomic variants: None Delayed phase: Image quality degraded by motion. No evidence of hydrocephalus or hemorrhage. IMPRESSION: Negative for large vessel occlusion. No change from earlier today. No significant carotid or vertebral artery stenosis. Findings compatible with metastatic disease in the chest. These results were called by telephone at the time of interpretation on 08/12/2015 at 6:05 pm to Dr. Kerney Elbe , who verbally acknowledged these results. Electronically Signed   By: Franchot Gallo M.D.   On: 08/12/2015 18:23   Ct Angio Neck W/cm &/or Wo/cm  08/12/2015  CLINICAL  DATA:  50 year old female code stroke with right facial droop and confusion. Last seen normal 1000 hours. Initial encounter. Personal history of metastatic breast cancer to the brain status post whole brain radiation in November 2016. EXAM: CT ANGIOGRAPHY HEAD AND NECK TECHNIQUE: Multidetector CT imaging of the head and neck was performed using the standard protocol during bolus administration of intravenous contrast. Multiplanar CT image reconstructions and MIPs were obtained to evaluate the vascular anatomy. Carotid stenosis measurements (when applicable) are obtained utilizing NASCET criteria, using the distal internal carotid diameter as the denominator. CONTRAST:  24m OMNIPAQUE IOHEXOL 350 MG/ML SOLN COMPARISON:  Head CT without contrast 1219 hours today reported separately. Brain MRI 07/02/2015. FINDINGS: CTA NECK Skeleton: No destructive osseous lesion identified in the head or neck. There is a small sclerotic lesion in the T1 spinous process which is indeterminate. Other neck: Large layering left and loculated appearing right pleural effusions associated with severe compressive atelectasis in the visible lungs. Superimposed right perihilar airspace disease and irregular peri-bronchovascular opacity plus septal thickening. There is abnormal soft tissue scattered throughout the superior mediastinum suspicious for metastatic nodal disease. Diminutive thyroid. Laryngeal and pharyngeal soft tissue contours are within normal limits. Parapharyngeal and retropharyngeal spaces as well as sublingual space, submandibular glands, and parotid glands are within normal limits. Visualized orbit soft tissues are within normal limits. No cervical lymphadenopathy identified. Aortic arch: There is a left side IJ approach porta cath in place. There is associated left innominate vein stenosis. The left upper extremity was injected. Subsequently there is significant reflux of venous contrast into the left internal jugular vein  and paravertebral venous system, limiting the arterial bolus. Three vessel arch configuration. No arch atherosclerosis or great vessel origin stenosis. Right carotid system: Negative. Left carotid system: Negative. Vertebral arteries: No proximal right subclavian artery stenosis. Normal right vertebral artery origin. Negative right vertebral artery to the skullbase. No proximal left subclavian artery stenosis. Normal left vertebral artery origin. DT all of the left vertebral artery limited by 8 dense paravertebral venous contrast reflux, but no evidence of left vertebral stenosis to the skullbase. CTA HEAD Posterior circulation: Distal left vertebral artery is dominant, the right functionally terminates in PICA. Normal left PICA origin. No distal left vertebral artery stenosis. That vessel primarily supplies the basilar. No basilar artery stenosis. SCA and right PCA origins are patent. Fetal type left PCA origin. Right posterior communicating artery appears diminutive. Bilateral PCA branches are within normal limits. Anterior circulation: Both ICA siphons are patent with minimal to mild calcified plaque primarily on the right. Both carotid termini are patent. Bilateral MCA and ACA origins appear normal. Codominant A1 segments. Anterior communicating artery and proximal ACA branches are within normal limits. Right MCA M1 segment and right MCA bifurcation are patent. Proximal right MCA branches are within normal limits. Left MCA M1 segment and left MCA bifurcation are patent. No definite proximal left MCA M2  occlusion ; suboptimal arterial contrast volume limits evaluation of the second order and more distal circle of Willis branches throughout. Venous sinuses: Patent. Anatomic variants: Dominant distal left vertebral artery. Fetal type left PCA origin. IMPRESSION: 1. Negative for emergent large vessel occlusion, discussed in person with the Neurology team at 1245 hrs. 2. Suboptimal arterial contrast bolus which  appears related to left innominate vein stenosis. This limits detail of the second order and distal circle of Willis branches. 3. No arterial stenosis in the neck. 4. Abnormal chest findings with mediastinal lymphadenopathy, large layering left and large loculated appearing right pleural effusions. Right upper lobe consolidation and septal thickening. 5. No metastatic disease identified in the neck. Electronically Signed   By: Genevie Ann M.D.   On: 08/12/2015 13:22    Assessment: 50 y.o. BRCA negative [place] woman with triple-negative stage IV breast cancer  (1) an status post right breast upper outer quadrant and right axillary lymph node biopsy 04/04/2012, both positive for an invasive ductal carcinoma, grade 3, triple negative, with an MIB-1 of 25%  (2) Treated neoadjuvantly with (a) fluorouracil, cyclophosphamide, and epirubicin (at 100 mg/M2) x4 completed 06/07/2012 (b) docetaxel (75 mg/M2) for one dose, 06/21/2012, poorly tolerated (c) carboplatin and gemcitabine given every 21 days for 6 cycles completed 10/11/2012  (3) status post right modified radical mastectomy 11/18/2012 showing a complete pathologic response--all 16 lymph nodes were benign (a) no plans for reconstructon  (4) adjuvant radiation therapy completed 03/06/2013  METASTATIC DISEASE June 2016 (5) 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, 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  (6) staging PET scan 11/06/2014 showed significant disease in the middle and lower lobes of the Right lung, Right effusion, multiple liver and bone lesions, as well as left lung nodules, a Right adrenal nodule, and widespread hypermetabolic adenopathy (a) brain MRI 11/16/2014 showed multiple cerebellar lesions  (7) RADIATION TREATMENTS thoracic metastases (a) 01/28/2015-02/11/2015: Thoracic Spine T8-10, 30 Gy in 10 fractions  (7) abraxane day 1 and day 8 of  each 21 day cycle started 11/09/2014: last dose 03/12/2015 (6 cycles) (a) PET scan 01/05/2015 shows an excellent initial response after 3 cycles (b) PET scan 03/17/2015 shows progression  (8) zolendronate started 11/16/2014, to be repeated every 12 weeks, most recent dose 05/28/2015  (9) Foundation 1 study sent 11/06/2014: patient's sister in law Garvin Fila following [phone]) (a) 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[place], NTRK1, MYC, ARID1A and LYN, none w approved therapies (b) pazopanib, ponatinib or crizotinib suggested as possible off-protocol options  (10) RADIATION TREATMENTS: Brain metastases: (a) s/p SRS therapy 12/11/2014 to 4 right cerebellar lesions (b) multiple (18+) lesions noted on repeat brain MRI 03/16/2015 (c) whole brain irradiation completed 04/12/2015 (d) repeat brain MRI for every 07/02/2015 shows resolution of the earlier lesions.  (11) uncontrolled pain: Started on OxyContin 10 mg twice a day with oxycodone 5 mg as needed 03/26/2015  (12) bilateral pleural effusions (a) s/p rightthoracentesis July 2016 (x2), 03/30/2015 (b) s/p left thoracentesis 08/02/2015  (13) started eribulin11/29/2016, originally planned to be given day one and day 8 of each 21 day cycle, but switched to every 14 days with onpro support because of persistent leukopenia. Discontinued 05/25/14, after 2 cycles, w no evidence of response  (14) cisplatin started 05/28/2015, repeated every 21 days. Last dose given 07/08/15. Discontinued due to low counts.   (15) [facility] study protocol [facility] 8921 started 07/28/15. Consists of pembrolizumab every 21 days. Cyclophosphamide given as a "primer" on day 1, cycle 1 only.   (16) left MCA  branch stroke clinically diagnosed 08/12/2015, s/p TPA same day, CT/angio heand&neck negative, MRI pending  PLAN: Jannis appears to have had an excellent response  to the tPA with clear speech and mentation this AM and nonfocal exam. I believe she will be able to tolerate MRI today if that is the plan--she has had no problems re claustrophobia or anxiety with MRIs in past.  She is scheduled for R pleurx placement under Dr Servando Snare next week-- patient would prefer to proceed--will alert Dr Roxy Horseman re admission.  Ido not believe this episode is related to her pembrolizumab. This is due for repeat 08/19/2015-- we will reassess next week, but my preference would be to proceed with the study even if we delay next treatment a few days.  Greatly appreciate your help to [facility]!  Chauncey Cruel, MD 08/13/2015  8:03 AM Medical Oncology and Hematology [facility] [address] [place], [place] [address] Tel. [phone]    Fax. [phone]

## 2015-08-13 NOTE — Progress Notes (Signed)
EEG completed, results pending. 

## 2015-08-13 NOTE — Progress Notes (Signed)
08/13/15 at 8:54am- [place] 1525 study notes-The pt went to the ER yesterday afternoon with signs of a possible stroke.  The pt received tPA therapy and was later admitted to the neuro ICU for observation.  The research nurse notified Marliss Czar, study coordinator, yesterday afternoon that the pt was admitted and SAE reporting would be forthcoming.  She said that she would notify Dr. Wetzel Bjornstad of the pt's SAE.  The research nurse met with Dr. [name] late yesterday afternoon to review her SAE and hospital records.  He stated that her stroke was not related to her pembrolizumab (unlikely related).  He felt that it was unrelated to her cytoxan therapy given on 07/28/15.  Dr. [name] stated that cancer patients in general are more prone to strokes.  He said that he felt that her stroke was "probably" related to her disease status.  He said that he would see the pt in the hospital.  The research nurse met with the Dr. [name] today to discuss the pt's neuro status.  He said that the pt was back to her baseline.  He was relieved that she had such a great response to the tPA.  He said the pt had clear speech and mentation this morning.  Dr. [name] reviewed the grades related to stroke.  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  Dr. [name] is aware that SAE reporting will be submitted today within 24 hours of her hospital admission.  Will continue to review the pt's hospital course and update reporting accordingly. Brion Aliment RN, BSN, CCRP Clinical Research Nurse 08/13/2015 9:07 AM   08/13/15 at 2:59pm -  [place] study notes- The research nurse completed the SAE reporting in [place], the [facility] form, and the 3500A Medwatch form.  The research nurse contacted Marliss Czar, [facility] study coordinator, and informed her that the SAE was reported in [place].  The study coordinator verified that she was able to see the SAE documentation in the [facility] system.  The research nurse subsequently scanned the [facility] form and 3500A  Medwatch form to the study coordinator.  She verified receipt of these forms.  The research nurse will continue to monitor the pt's hospital course and submit follow up forms when the pt is discharged.  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  Dr. [name] stated that the subject did "not seem to have had a stroke". Dr.[name] stated the pt probably had a seizure that mimicked a stroke.  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  Dr. [name] confirmed that the pt has had disease progression (neoplasm benign, malignant and unspecified -grade 3), and he removed the pt from the clinical trial.  Due to the pt's poor prognosis, the pt has been referred for hospice services.  The research nurse updated that MedWatch form and addressed that this event did not meet FDA expedited reporting requirements because it was not considered a SAR ( suspected adverse event).  The [facility] study coordinator, Marliss Czar, said that she would discuss this with her [employer] contact and forward the updated 3500A form to [employer].  The research nurse will continue to follow the pt's progress.  Jerline Pain, regulatory assistant, also was given a copy of the updated 3500A  Form.   Brion Aliment RN, BSN, [place] Nurse  08/17/2015 4:34 PM   08/19/15 at 2:33pm - The pt was discharged home with Hospice services yesterday.  The research nurse completed the [facility] follow up form since the pt was discharged from the hospital.  The SAE event term was changed from grade 2 stroke to grade 3 neoplasm benign, malignant unspecified.  The research nurse forwarded a copy of the form to [place] at [facility] and Jerline Pain, Psychologist, educational.  Due to the pt's significant cognitive decline, the pt will not be re-consented regarding the new,  increased risks for patients receiving Pembrolizumab.  The [facility] study coordinator Marliss Czar), Regulatory Associate ( Simona Huh), and the [facility]) are all aware that this pt will not be informed/re-consented about this new safety risk.   Brion Aliment RN, BSN, CCRP Clinical Research Nurse 08/19/2015 2:46 PM

## 2015-08-14 ENCOUNTER — Inpatient Hospital Stay (HOSPITAL_COMMUNITY): Payer: BLUE CROSS/BLUE SHIELD

## 2015-08-14 DIAGNOSIS — R4182 Altered mental status, unspecified: Secondary | ICD-10-CM

## 2015-08-14 DIAGNOSIS — C799 Secondary malignant neoplasm of unspecified site: Secondary | ICD-10-CM | POA: Insufficient documentation

## 2015-08-14 LAB — COMPREHENSIVE METABOLIC PANEL
ALBUMIN: 2.1 g/dL — AB (ref 3.5–5.0)
ALK PHOS: 74 U/L (ref 38–126)
ALT: 8 U/L — AB (ref 14–54)
AST: 16 U/L (ref 15–41)
Anion gap: 12 (ref 5–15)
BUN: 9 mg/dL (ref 6–20)
CALCIUM: 8.5 mg/dL — AB (ref 8.9–10.3)
CO2: 30 mmol/L (ref 22–32)
CREATININE: 0.54 mg/dL (ref 0.44–1.00)
Chloride: 94 mmol/L — ABNORMAL LOW (ref 101–111)
GFR calc non Af Amer: >60 mL/min (ref >60)
GLUCOSE: 89 mg/dL (ref 65–99)
Potassium: 3.6 mmol/L (ref 3.5–5.1)
SODIUM: 136 mmol/L (ref 135–145)
Total Bilirubin: 0.9 mg/dL (ref 0.3–1.2)
Total Protein: 5.4 g/dL — ABNORMAL LOW (ref 6.5–8.1)

## 2015-08-14 LAB — HEMOGLOBIN A1C
HEMOGLOBIN A1C: 5.1 % (ref 4.8–5.6)
MEAN PLASMA GLUCOSE: 100 mg/dL

## 2015-08-14 LAB — PHOSPHORUS: Phosphorus: 3.1 mg/dL (ref 2.5–4.6)

## 2015-08-14 LAB — CBC
HCT: 27.5 % — ABNORMAL LOW (ref 36.0–46.0)
HCT: 27.9 % — ABNORMAL LOW (ref 36.0–46.0)
HEMOGLOBIN: 8.6 g/dL — AB (ref 12.0–15.0)
Hemoglobin: 8.4 g/dL — ABNORMAL LOW (ref 12.0–15.0)
MCH: 32.7 pg (ref 26.0–34.0)
MCH: 32.8 pg (ref 26.0–34.0)
MCHC: 30.5 g/dL (ref 30.0–36.0)
MCHC: 30.8 g/dL (ref 30.0–36.0)
MCV: 107 fL — ABNORMAL HIGH (ref 78.0–100.0)
MCV: 106.5 fL — ABNORMAL HIGH (ref 78.0–100.0)
PLATELETS: 243 10*3/uL (ref 150–400)
Platelets: 224 10*3/uL (ref 150–400)
RBC: 2.57 MIL/uL — ABNORMAL LOW (ref 3.87–5.11)
RBC: 2.62 MIL/uL — ABNORMAL LOW (ref 3.87–5.11)
RDW: 18.3 % — AB (ref 11.5–15.5)
RDW: 18.3 % — ABNORMAL HIGH (ref 11.5–15.5)
WBC: 4.7 10*3/uL (ref 4.0–10.5)
WBC: 4.9 10*3/uL (ref 4.0–10.5)

## 2015-08-14 LAB — MAGNESIUM: MAGNESIUM: 1.3 mg/dL — AB (ref 1.7–2.4)

## 2015-08-14 MED ORDER — MEGESTROL ACETATE 20 MG PO TABS
20.0000 mg | ORAL_TABLET | Freq: Two times a day (BID) | ORAL | Status: DC
  Administered 2015-08-14 – 2015-08-18 (×9): 20 mg via ORAL
  Filled 2015-08-14 (×14): qty 1

## 2015-08-14 MED ORDER — SODIUM CHLORIDE 0.9% FLUSH
10.0000 mL | INTRAVENOUS | Status: DC | PRN
  Administered 2015-08-15: 20 mL
  Administered 2015-08-18: 10 mL
  Filled 2015-08-14 (×2): qty 40

## 2015-08-14 MED ORDER — SODIUM CHLORIDE 0.9% FLUSH
10.0000 mL | Freq: Two times a day (BID) | INTRAVENOUS | Status: DC
Start: 2015-08-14 — End: 2015-08-18
  Administered 2015-08-15 – 2015-08-16 (×2): 10 mL

## 2015-08-14 MED ORDER — PANTOPRAZOLE SODIUM 40 MG PO TBEC
40.0000 mg | DELAYED_RELEASE_TABLET | Freq: Every day | ORAL | Status: DC
  Administered 2015-08-14 – 2015-08-18 (×5): 40 mg via ORAL
  Filled 2015-08-14 (×6): qty 1

## 2015-08-14 NOTE — Progress Notes (Signed)
STROKE TEAM PROGRESS NOTE   HISTORY OF PRESENT ILLNESS AT THE TIME OF ADMISSION Nicole Valentine is an 50 y.o. female who has extensive cancer history. She was to go on a trip with her husband today when at 1000 08/12/2015 she was noted to suddenly have difficulty expressing herself. She was brought to [facility] hospital as a code stroke. She was noted to have difficulty expressing herself but was able to follow commands and move all extremities well. CT was negative and CTA head and neck showed no large branch occlusion. tPA was offered and husband wanted to go forward. Patient was administered IV t-PA. Afterwards, she had neuro worsening with increased expressive and receptive aphasia with worsening right sided neglect. Had stat CTA which showed no LVO.   She was admitted to the neuro ICU for further evaluation and treatment. IV TPA 53 mg Thursday 08/12/2015 at 1245.   SUBJECTIVE (INTERVAL HISTORY) Her husband is at the bedside.  Overall she feels her condition is waxing and waning.  Overnight she was noted to have spells of confusion and difficulty talking which were brief.  rapidly improving. She presented with expressive aphasia initially but subsequently became confused and some receptive difficulties as well. She has had very poor appetite since admission.  She has no complaints other than poor appetite and "no taste buds"   OBJECTIVE Temp:  [97.5 F (36.4 C)-98.5 F (36.9 C)] 98.1 F (36.7 C) (03/25 0800) Pulse Rate:  [88-113] 97 (03/25 0800) Cardiac Rhythm:  [-] Sinus tachycardia (03/25 0800) Resp:  [7-37] 8 (03/25 0800) BP: (136-162)/(83-110) 148/99 mmHg (03/25 0800) SpO2:  [98 %-100 %] 98 % (03/25 0800)  CBC:   Recent Labs Lab 08/10/15 1428  08/12/15 1218 08/12/15 1221 08/14/15 0845  WBC 7.4  --  7.2  --  4.7  NEUTROABS 6.3  --  5.9  --   --   HGB 10.3*  < > 10.3* 11.2* 8.4*  HCT 31.1*  < > 31.8* 33.0* 27.5*  MCV 105.3*  --  105.0*  --  107.0*  PLT 323  --  264  --  243  < > =  values in this interval not displayed.  Basic Metabolic Panel:   Recent Labs Lab 08/10/15 1427 08/10/15 1428  08/12/15 1218 08/12/15 1221 08/14/15 0845  NA  --  138  < > 136 133* 136  K  --  3.7  < > 4.1 3.9 3.6  CL  --   --   < > 93* 88* 94*  CO2  --  34*  --  28  --  30  GLUCOSE  --  108  < > 102* 103* 89  BUN  --  13.5  < > 15 16 9   CREATININE  --  0.6  < > 0.54 0.50 0.54  CALCIUM  --  9.4  --  9.0  --  8.5*  MG  --  1.2*  --   --   --  1.3*  PHOS 4.0  --   --   --   --  3.1  < > = values in this interval not displayed.  Lipid Panel:     Component Value Date/Time   CHOL 175 08/13/2015 0455   TRIG 131 08/13/2015 0455   HDL 24* 08/13/2015 0455   CHOLHDL 7.3 08/13/2015 0455   VLDL 26 08/13/2015 0455   LDLCALC 125* 08/13/2015 0455   HgbA1c:  Lab Results  Component Value Date   HGBA1C 5.1 08/13/2015  Urine Drug Screen:     Component Value Date/Time   LABOPIA POSITIVE* 08/12/2015 1451   COCAINSCRNUR NONE DETECTED 08/12/2015 1451   LABBENZ NONE DETECTED 08/12/2015 1451   AMPHETMU NONE DETECTED 08/12/2015 1451   THCU NONE DETECTED 08/12/2015 1451   LABBARB NONE DETECTED 08/12/2015 1451      IMAGING  Ct Angio Head and Neck W/cm &/or Wo Cm  08/12/2015   Negative for large vessel occlusion. No change from earlier today. No significant carotid or vertebral artery stenosis. Findings compatible with metastatic disease in the chest.   08/12/2015    1. Negative for emergent large vessel occlusion.  2. Suboptimal arterial contrast bolus which appears related to left innominate vein stenosis. This limits detail of the second order and distal circle of Willis branches.  3. No arterial stenosis in the neck.  4. Abnormal chest findings with mediastinal lymphadenopathy, large layering left and large loculated appearing right pleural effusions. Right upper lobe consolidation and septal thickening.  5. No metastatic disease identified in the neck.    Ct Head Wo  Contrast  08/12/2015  Negative CT of the head. Negative for intracranial hemorrhage. No acute infarct.  08/12/2015   No acute intracranial pathology.    MRI Brain with and without Contrast 08/13/2015 No acute infarction. 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 Few small areas of involvement possible in the supratentorial region as well.  2D Echocardiogram   - Left ventricle: The cavity size was normal. Wall thickness as  normal. The estimated ejection fraction was 55%. Wall motion was normal; there were no regional wall motion abnormalities. Indeterminant diastolic function. - Aortic valve: There was no stenosis. - Mitral valve: There was no significant regurgitation. - Right ventricle: The cavity size was normal. Systolic function was normal. - Pulmonary arteries: No complete TR doppler jet so unable to estimate PA systolic pressure. - Systemic veins: The IVC was not visualized. - Pericardium, extracardiac: A small circumferential pericardial effusion was present. Impressions:    Normal LV size with EF 55%. Normal RV size and systolic function. No significant valvular abnormality. Small circumferential pericardial effusion.   PHYSICAL EXAM Pleasant middle aged caucasian lady not in distress.  Lethargic and sleepy between answering questions and tasks  Afebrile.   HEENT:  Head is nontraumatic. Neck is supple without bruit.  Patient wearing head covering.     Cardiac exam no murmur or gallop.  Lungs with decreased breath sounds on the right Abd:  No distention,  Normal bowel sounds Extrem:  Distal pulses are well felt.  Neurological Exam ;  Mental Status:  Lethargic oriented x 3. Normal speech and language.  Speech is fluent  Cranial Nerves:  PERRL, eye movements full without nystagmus.  Hearing is grossly normal. Face symmetric. Shrug symmetric . Motor:  Normal strength, non-focal    Sensation:  Intact to LT  Coordination:  Finger to nose intact; NT in legs due to lethargy  Gait deferred.  ASSESSMENT/PLAN [name] is a 50 y.o. female with extensive cancer history presenting with expressive aphasia. She received IV t-PA 08/12/2015 at 1252. CTA d/t neuro worsening post tPA administration with no LVO. IV TPA 53 mg Thursday 08/12/2015 at 1245.   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  Also, s/p iv TPA  CTA no large vessel occlusion  Repeat CTA with no LVO  CTA  neck no large vessel occlusion  Repeat CTA neck with no large vessel occlusion  2D Echo done 08/04/2015  No source of embolus   MRI  With and without contract   No infarct metastic disease noted.  EEG: This EEG is abnormal with mild generalized nonspecific slowing of cerebral activity. No evidence of seizure activity was recorded. However, the absence of seizure activity on an interictal EEG recording does not rule out seizure disorder.   LDL 125  HgbA1c pending  SCDs for VTE prophylaxis Diet Heart Room service appropriate?: Yes; Fluid consistency:: Thin  No antithrombotic prior to admission, now on No antithrombotic as within 24h post tPA. Plan aspirin if post tPA imaging neg for hemorrhage  Ongoing aggressive stroke risk factor management  Therapy recommendations:  Pending. 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 I reviewed notes, independently viewed imaging studies, participated in medical decision making and plan of care. I have made additions or clarifications directly to the above note.   Documentation accurately reflects findings. The laboratory and radiographic studies were personally reviewed by me.  ROS completed by me personally and pertinent positives fully documented.  Assessment and plan completed by me personally and fully documented above.  Continue Keppra.  If intermittent changes resolve wonder believe finding related to post-ictal state.  If they continue, may need to do cEEG  CXR pending for this AM  May transfer if no may changes on the CXR  Added Megace for appetite  Worsening anemia.  Will repeat CBC later today to follow-up  Hyponatremia improved  Replete magnesium  Added statin  Condition is worsened   This patient is critically ill and at significant risk of neurological worsening, death and care requires constant monitoring of vital signs, hemodynamics,respiratory and cardiac monitoring, extensive review of multiple databases, frequent neurological assessment, discussion with family, other specialists and medical decision making of high complexity.  This critical care time does not reflect procedure time, or teaching time or supervisory time of PA/NP/Med Resident etc. but could involve care discussion time.  I spent 30 minutes of Neurocritical Care time in the care of  this patient.  SIGNED BY: Dr. Elissa Hefty           To contact Stroke Continuity provider, please refer to [email or web address] After hours, contact General Neurology

## 2015-08-14 NOTE — Progress Notes (Signed)
Spoke with Lars Pinks PA-C with cardiothoracic surgeons regarding patients increased dyspnea, increased o2 requirement, and change in CXR. She spoke with Dr Cyndia Bent who recommended we contact IR about performing a right thoracentesis. Spoke with Dr Pascal Lux who reviewed images and asked that we schedule thoracentesis for tomorrow. Will place order as discussed with Dr Belenda Cruise.  Mikey Bussing PA-C [facility] Pager [phone] 08/14/2015, 4:24 PM

## 2015-08-14 NOTE — Progress Notes (Signed)
Paged MD and notified him of briff episode (less than 10 minutes) of decreased responsiveness and increased aphasia with patient. Patient  became more responsive and speech returned close to normal.

## 2015-08-14 NOTE — Progress Notes (Signed)
I was contacted that patient's chest x ray showed increasing left pleural effusion and patient now requiring more oxygen via [place] (now 4 liters). I spoke with Dr. Cyndia Bent, who recommends IR do a thoracentesis on the affected side (right). I spoke with Dr. Elissa Hefty to convey this information.

## 2015-08-15 ENCOUNTER — Inpatient Hospital Stay (HOSPITAL_COMMUNITY): Payer: BLUE CROSS/BLUE SHIELD

## 2015-08-15 DIAGNOSIS — J90 Pleural effusion, not elsewhere classified: Secondary | ICD-10-CM | POA: Insufficient documentation

## 2015-08-15 LAB — BODY FLUID CELL COUNT WITH DIFFERENTIAL
EOS FL: 0 %
LYMPHS FL: 43 %
MONOCYTE-MACROPHAGE-SEROUS FLUID: 22 % — AB (ref 50–90)
Neutrophil Count, Fluid: 35 % — ABNORMAL HIGH (ref 0–25)
WBC FLUID: 180 uL (ref 0–1000)

## 2015-08-15 LAB — CBC
HCT: 30 % — ABNORMAL LOW (ref 36.0–46.0)
Hemoglobin: 9.7 g/dL — ABNORMAL LOW (ref 12.0–15.0)
MCH: 34.3 pg — AB (ref 26.0–34.0)
MCHC: 32.3 g/dL (ref 30.0–36.0)
MCV: 106 fL — ABNORMAL HIGH (ref 78.0–100.0)
PLATELETS: 225 10*3/uL (ref 150–400)
RBC: 2.83 MIL/uL — ABNORMAL LOW (ref 3.87–5.11)
RDW: 18.2 % — AB (ref 11.5–15.5)
WBC: 5 10*3/uL (ref 4.0–10.5)

## 2015-08-15 LAB — GRAM STAIN

## 2015-08-15 LAB — GLUCOSE, SEROUS FLUID: Glucose, Fluid: 73 mg/dL

## 2015-08-15 LAB — LACTATE DEHYDROGENASE, PLEURAL OR PERITONEAL FLUID: LD FL: 153 U/L — AB (ref 3–23)

## 2015-08-15 LAB — PROTEIN, BODY FLUID: Total protein, fluid: <3 g/dL

## 2015-08-15 MED ORDER — FLUTICASONE PROPIONATE 50 MCG/ACT NA SUSP
1.0000 | Freq: Every day | NASAL | Status: DC
  Administered 2015-08-16 – 2015-08-18 (×3): 1 via NASAL
  Filled 2015-08-15: qty 16

## 2015-08-15 MED ORDER — LIDOCAINE HCL (PF) 1 % IJ SOLN
INTRAMUSCULAR | Status: AC
  Filled 2015-08-15: qty 10

## 2015-08-15 MED ORDER — MAGNESIUM OXIDE 400 (241.3 MG) MG PO TABS
400.0000 mg | ORAL_TABLET | Freq: Every day | ORAL | Status: DC
  Administered 2015-08-16 – 2015-08-17 (×2): 400 mg via ORAL
  Filled 2015-08-15 (×2): qty 1

## 2015-08-15 MED ORDER — DEXTROSE 5 % IV SOLN
3.0000 g | Freq: Once | INTRAVENOUS | Status: AC
  Administered 2015-08-15: 3 g via INTRAVENOUS
  Filled 2015-08-15: qty 6

## 2015-08-15 MED ORDER — FENTANYL 75 MCG/HR TD PT72
75.0000 ug | MEDICATED_PATCH | TRANSDERMAL | Status: DC
  Administered 2015-08-15: 75 ug via TRANSDERMAL

## 2015-08-15 NOTE — Progress Notes (Signed)
PT Cancellation Note  Patient Details Name: Nicole Valentine MRN: [id number] DOB: 05-Dec-1965   Cancelled Treatment:    Reason Eval/Treat Not Completed: Other (comment)   Noted for thoracentesis today;   Will follow up later today as time allows;  Otherwise, will follow up for PT tomorrow;   Thank you,  Roney Marion, [place] Pager [phone] Office [phone]     Roney Marion [facility] 08/15/2015, 7:24 AM

## 2015-08-15 NOTE — Progress Notes (Signed)
STROKE TEAM PROGRESS NOTE   HISTORY OF PRESENT ILLNESS AT THE TIME OF ADMISSION Nicole Valentine is an 50 y.o. female who has extensive cancer history. She was to go on a trip with her husband today when at 1000 08/12/2015 she was noted to suddenly have difficulty expressing herself. She was brought to [facility] hospital as a code stroke. She was noted to have difficulty expressing herself but was able to follow commands and move all extremities well. CT was negative and CTA head and neck showed no large branch occlusion. tPA was offered and husband wanted to go forward. Patient was administered IV t-PA. Afterwards, she had neuro worsening with increased expressive and receptive aphasia with worsening right sided neglect. Had stat CTA which showed no LVO.   She was admitted to the neuro ICU for further evaluation and treatment. IV TPA 53 mg Thursday 08/12/2015 at 1245.   SUBJECTIVE (INTERVAL HISTORY) A family friend is at the bedside. The patient states that she is breathing much better following the thoracentesis this morning. She feels that her speech is better. She has occasional mild word finding difficulties but otherwise is doing well.   OBJECTIVE Temp:  [97.5 F (36.4 C)-98.3 F (36.8 C)] 98.3 F (36.8 C) (03/26 1114) Pulse Rate:  [99-118] 113 (03/26 0745) Cardiac Rhythm:  [-] Sinus tachycardia (03/26 0804) Resp:  [17-32] 28 (03/26 0745) BP: (135-158)/(77-104) 147/79 mmHg (03/26 0948) SpO2:  [94 %-100 %] 100 % (03/26 0745) Weight:  [60 kg (132 lb 4.4 oz)] 60 kg (132 lb 4.4 oz) (03/25 2330)  CBC:   Recent Labs Lab 08/10/15 1428 08/12/15 1218  08/14/15 1903 08/15/15 0530  WBC 7.4 7.2  < > 4.9 5.0  NEUTROABS 6.3 5.9  --   --   --   HGB 10.3* 10.3*  < > 8.6* 9.7*  HCT 31.1* 31.8*  < > 27.9* 30.0*  MCV 105.3* 105.0*  < > 106.5* 106.0*  PLT 323 264  < > 224 225  < > = values in this interval not displayed.  Basic Metabolic Panel:   Recent Labs Lab 08/10/15 1427 08/10/15 1428   08/12/15 1218 08/12/15 1221 08/14/15 0845  NA  --  138  < > 136 133* 136  K  --  3.7  < > 4.1 3.9 3.6  CL  --   --   < > 93* 88* 94*  CO2  --  34*  --  28  --  30  GLUCOSE  --  108  < > 102* 103* 89  BUN  --  13.5  < > 15 16 9   CREATININE  --  0.6  < > 0.54 0.50 0.54  CALCIUM  --  9.4  --  9.0  --  8.5*  MG  --  1.2*  --   --   --  1.3*  PHOS 4.0  --   --   --   --  3.1  < > = values in this interval not displayed.  Lipid Panel:     Component Value Date/Time   CHOL 175 08/13/2015 0455   TRIG 131 08/13/2015 0455   HDL 24* 08/13/2015 0455   CHOLHDL 7.3 08/13/2015 0455   VLDL 26 08/13/2015 0455   LDLCALC 125* 08/13/2015 0455   HgbA1c:  Lab Results  Component Value Date   HGBA1C 5.1 08/13/2015   Urine Drug Screen:     Component Value Date/Time   LABOPIA POSITIVE* 08/12/2015 1451   COCAINSCRNUR NONE DETECTED  08/12/2015 1451   LABBENZ NONE DETECTED 08/12/2015 1451   AMPHETMU NONE DETECTED 08/12/2015 1451   THCU NONE DETECTED 08/12/2015 1451   LABBARB NONE DETECTED 08/12/2015 1451      IMAGING  Chest x-ray one view 08/15/2015 1. Reduction in volume of LEFT pleural effusion following thoracentesis. No pneumothorax. 2. Persistent large RIGHT pleural effusion.    Ct Angio Head and Neck W/cm &/or Wo Cm  08/12/2015   Negative for large vessel occlusion. No change from earlier today. No significant carotid or vertebral artery stenosis. Findings compatible with metastatic disease in the chest.   08/12/2015    1. Negative for emergent large vessel occlusion.  2. Suboptimal arterial contrast bolus which appears related to left innominate vein stenosis. This limits detail of the second order and distal circle of Willis branches.  3. No arterial stenosis in the neck.  4. Abnormal chest findings with mediastinal lymphadenopathy, large layering left and large loculated appearing right pleural effusions. Right upper lobe consolidation and septal thickening.  5. No metastatic  disease identified in the neck.    Ct Head Wo Contrast  08/12/2015  Negative CT of the head. Negative for intracranial hemorrhage. No acute infarct.  08/12/2015   No acute intracranial pathology.    MRI Brain with and without Contrast 08/13/2015 No acute infarction. 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 Few small areas of involvement possible in the supratentorial region as well.  2D Echocardiogram   - Left ventricle: The cavity size was normal. Wall thickness as  normal. The estimated ejection fraction was 55%. Wall motion was normal; there were no regional wall motion abnormalities. Indeterminant diastolic function. - Aortic valve: There was no stenosis. - Mitral valve: There was no significant regurgitation. - Right ventricle: The cavity size was normal. Systolic function was normal. - Pulmonary arteries: No complete TR doppler jet so unable to estimate PA systolic pressure. - Systemic veins: The IVC was not visualized. - Pericardium, extracardiac: A small circumferential pericardial effusion was present. Impressions:    Normal LV size with EF 55%. Normal RV size and systolic function. No significant valvular abnormality. Small circumferential pericardial effusion.   PHYSICAL EXAM Pleasant middle aged caucasian lady not in distress.  Lethargic and sleepy between answering questions and tasks  Afebrile.   HEENT:  Head is nontraumatic. Neck is supple without bruit.  Patient wearing head covering.     Cardiac exam no murmur or gallop.  Lungs - decreased breath sounds on the right with occasional end-expiratory wheeze. Good air movement on left with diffuse rhonchi. Abd:  No distention,  Normal bowel sounds Extrem:  Distal pulses are well felt.  Neurological Exam ;  Mental Status:  Lethargic oriented x 3. Normal speech and language.  Speech is fluent  Cranial Nerves:  PERRL, eye movements  full without nystagmus.  Hearing is grossly normal. Face symmetric. Shrug symmetric . Motor:  Normal strength, non-focal   Sensation:  Intact to LT  Coordination:  Finger to nose intact; NT in legs due to lethargy  Gait deferred.  ASSESSMENT/PLAN Nicole Valentine is a 50 y.o. female with extensive cancer history presenting with expressive aphasia. She received IV t-PA 08/12/2015 at 1252. CTA d/t neuro worsening post tPA administration with no LVO. IV TPA 53 mg Thursday 08/12/2015 at 1245.   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  Also,  s/p iv TPA  CTA no large vessel occlusion  Repeat CTA with no LVO  CTA neck no large vessel occlusion  Repeat CTA neck with no large vessel occlusion  2D Echo done 08/04/2015  No source of embolus   MRI  With and without contract   No infarct - metastic disease noted.  EEG: This EEG is abnormal with mild generalized nonspecific slowing of cerebral activity. No evidence of seizure activity was recorded. However, the absence of seizure activity on an interictal EEG recording does not rule out seizure disorder.   LDL 125  HgbA1c 5.1  SCDs for VTE prophylaxis Diet regular Room service appropriate?: Yes; Fluid consistency:: Thin  No antithrombotic prior to admission, now on No antithrombotic as within 24h post tPA. Plan aspirin if post tPA imaging neg for hemorrhage  Ongoing aggressive stroke risk factor management  Therapy recommendations:  Pending. 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 Right thoracentesis was ordered for today per Dr. Vivi Martens instructions. Left thoracentesis was performed. Persistent large right  pleural effusion by chest x-ray. Discussed with Dr. Pascal Lux in IR. Right thoracentesis could be performed tomorrow if we would like; however, the patient appears to be breathing better and we will defer to Dr. Servando Snare. Labs pending.  Anemia  NPO - for procedure  Currently not on antiplatelet therapy or statin - but no documented stroke.  Stroke workup is complete unless a continuous EEG is felt to be indicated to further evaluate for stroke mimic seizure.  Tachycardia - question etiology. May be related to anemia or pain.  Low magnesium levels - supplement - recheck in AM   Nicole Bussing PA-C [facility] Pager [phone] 08/15/2015, 1:09 PM   Hospital day # 3  CRITICAL CARE NEUROLOGY ATTENDING NOTE Patient was seen and examined. I reviewed notes, independently viewed imaging studies, participated in medical decision making and plan of care. I have made additions or clarifications directly to the above note.  Documentation accurately reflects findings. The laboratory and radiographic studies were personally reviewed by me.  ROS completed by me personally and pertinent positives fully documented.  Assessment and plan completed by me personally and fully documented above.  Continue Keppra.  If intermittent changes resolve wonder believe finding related to post-ictal state.  If they continue, may need to do cEEG  CXR pending for this AM  Continue Megace for appetite  Hyponatremia improved  Continue statin  Condition is worsened   This patient is seriously ill and at significant risk of neurological worsening, death and care requires constant monitoring of vital signs, hemodynamics,respiratory and cardiac monitoring, extensive review of multiple databases, frequent neurological assessment, discussion with family, other specialists and medical decision making of high complexity.  This care time does not reflect procedure time, or teaching time or supervisory time  of PA/NP/Med Resident etc. but could involve care discussion time.  I spent 30 minutes of Care time in the care of  this patient.  SIGNED BY: Dr. Elissa Hefty           To contact Stroke Continuity provider, please refer to [email or web address] After hours, contact General Neurology

## 2015-08-15 NOTE — Procedures (Signed)
Successful US guided left thoracentesis. Yielded 1.3 liters of clear yellow fluid with sediment/particulate Pt tolerated procedure well. No immediate complications.  Specimen was sent for labs. CXR ordered.  Aanyah Loa S Ulrick Methot PA-C 08/15/2015 10:10 AM

## 2015-08-15 NOTE — Progress Notes (Signed)
   08/15/15 0628  Oxygen Therapy  SpO2 94 %  O2 Device Nasal Cannula  O2 Flow Rate (L/min) 6 L/min  Pulse Oximetry Type Continuous  pt complaining of increased sob oxygen saturation 92 % on 4 liters respirations 28 increased oxygen to 6 liters, pt states that she feles better

## 2015-08-16 ENCOUNTER — Other Ambulatory Visit: Payer: Self-pay | Admitting: *Deleted

## 2015-08-16 DIAGNOSIS — J948 Other specified pleural conditions: Secondary | ICD-10-CM

## 2015-08-16 DIAGNOSIS — R569 Unspecified convulsions: Secondary | ICD-10-CM

## 2015-08-16 DIAGNOSIS — R9402 Abnormal brain scan: Secondary | ICD-10-CM

## 2015-08-16 DIAGNOSIS — C799 Secondary malignant neoplasm of unspecified site: Secondary | ICD-10-CM

## 2015-08-16 DIAGNOSIS — C50919 Malignant neoplasm of unspecified site of unspecified female breast: Secondary | ICD-10-CM | POA: Insufficient documentation

## 2015-08-16 LAB — PH, BODY FLUID: pH, Body Fluid: 7.4

## 2015-08-16 LAB — CBC
HCT: 27.6 % — ABNORMAL LOW (ref 36.0–46.0)
Hemoglobin: 9 g/dL — ABNORMAL LOW (ref 12.0–15.0)
MCH: 34.2 pg — ABNORMAL HIGH (ref 26.0–34.0)
MCHC: 32.6 g/dL (ref 30.0–36.0)
MCV: 104.9 fL — ABNORMAL HIGH (ref 78.0–100.0)
Platelets: 194 10*3/uL (ref 150–400)
RBC: 2.63 MIL/uL — ABNORMAL LOW (ref 3.87–5.11)
RDW: 18 % — ABNORMAL HIGH (ref 11.5–15.5)
WBC: 5.8 10*3/uL (ref 4.0–10.5)

## 2015-08-16 LAB — MAGNESIUM: Magnesium: 1.5 mg/dL — ABNORMAL LOW (ref 1.7–2.4)

## 2015-08-16 LAB — BASIC METABOLIC PANEL
Anion gap: 12 (ref 5–15)
BUN: 6 mg/dL (ref 6–20)
CO2: 33 mmol/L — ABNORMAL HIGH (ref 22–32)
Calcium: 8.3 mg/dL — ABNORMAL LOW (ref 8.9–10.3)
Chloride: 93 mmol/L — ABNORMAL LOW (ref 101–111)
Creatinine, Ser: 0.52 mg/dL (ref 0.44–1.00)
GFR calc Af Amer: >60 mL/min (ref >60)
GFR calc non Af Amer: >60 mL/min (ref >60)
Glucose, Bld: 92 mg/dL (ref 65–99)
Potassium: 3 mmol/L — ABNORMAL LOW (ref 3.5–5.1)
Sodium: 138 mmol/L (ref 135–145)

## 2015-08-16 LAB — PROTIME-INR
INR: 1.81 — AB (ref 0.00–1.49)
PROTHROMBIN TIME: 20.9 s — AB (ref 11.6–15.2)

## 2015-08-16 LAB — AMYLASE, PLEURAL FLUID: Amylase, Pleural Fluid: 18 U/L

## 2015-08-16 LAB — APTT: aPTT: 40 seconds — ABNORMAL HIGH (ref 24–37)

## 2015-08-16 MED ORDER — LORATADINE 10 MG PO TABS
10.0000 mg | ORAL_TABLET | Freq: Every day | ORAL | Status: DC
  Filled 2015-08-16: qty 1

## 2015-08-16 MED ORDER — DEXTROSE 5 % IV SOLN: 1.5000 g | INTRAVENOUS | Status: DC

## 2015-08-16 MED ORDER — DEXTROSE 5 % IV SOLN
1.5000 g | INTRAVENOUS | Status: AC
  Administered 2015-08-17: 1.5 g via INTRAVENOUS
  Filled 2015-08-16: qty 1.5

## 2015-08-16 MED ORDER — ONDANSETRON HCL 40 MG/20ML IJ SOLN
8.0000 mg | Freq: Two times a day (BID) | INTRAMUSCULAR | Status: DC | PRN
  Filled 2015-08-16: qty 4

## 2015-08-16 NOTE — Progress Notes (Signed)
PT Cancellation Note  Patient Details Name: Nicole Valentine MRN: [id number] DOB: [birth year]   Cancelled Treatment:    Reason Eval/Treat Not Completed: Patient declined, no reason specified. Pt resting upon PT arrival and requests that PT return tomorrow.  Collie Siad PT, DPT  Pager: [phone] Phone: [phone] 08/16/2015, 3:14 PM

## 2015-08-16 NOTE — Progress Notes (Signed)
STROKE TEAM PROGRESS NOTE   SUBJECTIVE (INTERVAL HISTORY) Patient is up in the chair at the bedside. Her friend is with her. She has no new complaints. Dr. Ron Agee had been in this am, plan for right pleurx and consider comfort care vs. Brain XRT, intrathecal chemo and peripheral chemo. EEG no seizure.    OBJECTIVE Temp:  [97.6 F (36.4 C)-98.3 F (36.8 C)] 97.7 F (36.5 C) (03/27 0800) Pulse Rate:  [100-111] 111 (03/27 0415) Cardiac Rhythm:  [-] Sinus tachycardia (03/27 0700) Resp:  [13-19] 19 (03/27 0415) BP: (133-153)/(79-97) 153/97 mmHg (03/27 0415) SpO2:  [98 %-100 %] 99 % (03/27 0415)  CBC:   Recent Labs Lab 08/10/15 1428 08/12/15 1218  08/15/15 0530 08/16/15 0329  WBC 7.4 7.2  < > 5.0 5.8  NEUTROABS 6.3 5.9  --   --   --   HGB 10.3* 10.3*  < > 9.7* 9.0*  HCT 31.1* 31.8*  < > 30.0* 27.6*  MCV 105.3* 105.0*  < > 106.0* 104.9*  PLT 323 264  < > 225 194  < > = values in this interval not displayed.  Basic Metabolic Panel:   Recent Labs Lab 08/10/15 1427  08/14/15 0845 08/16/15 0329  NA  --   < > 136 138  K  --   < > 3.6 3.0*  CL  --   < > 94* 93*  CO2  --   < > 30 33*  GLUCOSE  --   < > 89 92  BUN  --   < > 9 6  CREATININE  --   < > 0.54 0.52  CALCIUM  --   < > 8.5* 8.3*  MG  --   < > 1.3* 1.5*  PHOS 4.0  --  3.1  --   < > = values in this interval not displayed.  Lipid Panel:     Component Value Date/Time   CHOL 175 08/13/2015 0455   TRIG 131 08/13/2015 0455   HDL 24* 08/13/2015 0455   CHOLHDL 7.3 08/13/2015 0455   VLDL 26 08/13/2015 0455   LDLCALC 125* 08/13/2015 0455   HgbA1c:  Lab Results  Component Value Date   HGBA1C 5.1 08/13/2015   Urine Drug Screen:     Component Value Date/Time   LABOPIA POSITIVE* 08/12/2015 1451   COCAINSCRNUR NONE DETECTED 08/12/2015 1451   LABBENZ NONE DETECTED 08/12/2015 1451   AMPHETMU NONE DETECTED 08/12/2015 1451   THCU NONE DETECTED 08/12/2015 1451   LABBARB NONE DETECTED 08/12/2015 1451       IMAGING I have personally reviewed the radiological images below and agree with the radiology interpretations.  Chest x-ray  08/15/2015 1. Reduction in volume of LEFT pleural effusion following thoracentesis. No pneumothorax. 2. Persistent large RIGHT pleural effusion.  Ct Angio Head and Neck W/cm &/or Wo Cm 08/12/2015   Negative for large vessel occlusion. No change from earlier today. No significant carotid or vertebral artery stenosis. Findings compatible with metastatic disease in the chest.   08/12/2015    1. Negative for emergent large vessel occlusion.  2. Suboptimal arterial contrast bolus which appears related to left innominate vein stenosis. This limits detail of the second order and distal circle of Willis branches.  3. No arterial stenosis in the neck.  4. Abnormal chest findings with mediastinal lymphadenopathy, large layering left and large loculated appearing right pleural effusions. Right upper lobe consolidation and septal thickening.  5. No metastatic disease identified in the neck.   Ct  Head Wo Contrast 08/12/2015  Negative CT of the head. Negative for intracranial hemorrhage. No acute infarct.  08/12/2015   No acute intracranial pathology.   MRI Brain with and without Contrast 08/13/2015 No acute infarction. 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 Few small areas of involvement possible in the supratentorial region as well.  2D Echocardiogram  - Left ventricle: The cavity size was normal. Wall thickness as  normal. The estimated ejection fraction was 55%. Wall motion was normal; there were no regional wall motion abnormalities. Indeterminant diastolic function. - Aortic valve: There was no stenosis. - Mitral valve: There was no significant regurgitation. - Right ventricle: The cavity size was normal. Systolic function was normal. - Pulmonary arteries: No complete TR doppler  jet so unable to estimate PA systolic pressure. - Systemic veins: The IVC was not visualized. - Pericardium, extracardiac: A small circumferential pericardial effusion was present. Impressions:    Normal LV size with EF 55%. Normal RV size and systolic function. No significant valvular abnormality. Small circumferential pericardial effusion.  EEG: This EEG is abnormal with mild generalized nonspecific slowing of cerebral activity. No evidence of seizure activity was recorded. However, the absence of seizure activity on an interictal EEG recording does not rule out seizure disorder.    PHYSICAL EXAM Pleasant middle aged caucasian lady not in distress.  Lethargic and sleepy between answering questions and tasks  Afebrile.   HEENT:  Head is nontraumatic. Neck is supple without bruit.  Patient wearing head covering.     Cardiac exam no murmur or gallop.  Lungs - decreased breath sounds on the right with occasional end-expiratory wheeze. Good air movement on left with diffuse rhonchi. Abd:  No distention,  Normal bowel sounds Extrem:  Distal pulses are well felt.  Neurological Exam ;  Mental Status:  Lethargic oriented x 3. Normal speech and language.  Speech is fluent  Cranial Nerves:  PERRL, eye movements full without nystagmus.  Hearing is grossly normal. Face symmetric. Shrug symmetric . Motor:  Normal strength, non-focal   Sensation:  Intact to LT  Coordination:  Finger to nose intact; NT in legs due to lethargy  Gait deferred.   ASSESSMENT/PLAN Ms. Azenet Donati is a 50 y.o. female with extensive metastasis breast cancer history with presenting with expressive aphasia. She received IV t-PA 08/12/2015 at 1252. CTA d/t neuro worsening post tPA administration with no LVO. Pt symptoms fluctuate and concerning for seizure. MRI concerning for brain mets.   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  EEG no seizure activity  Continue Keppra 1000 mg XR daily  LDL 125  HgbA1c 5.1  SCDs for VTE prophylaxis Diet regular Room service appropriate?: Yes; Fluid consistency:: Thin  No antithrombotic prior to admission, now on No antithrombotic   Currently not on antiplatelet therapy or statin - but no documented stroke.  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 Now with bilateral pleural effusions. Looking to right pleurx optimize long symptom palliation  cancer is terminal  Patient made DO NOT RESUSCITATE this a.m.  Severely malnourished, Body mass index is 20.71 kg/(m^2).   Dr. Ron Agee o meet with family tomorrow to discuss supportive care versus life-prolonging treatment  Other Active Problems  Hypothyroid, on replacement   Anemia  Tachycardia   Low magnesium levels - supplement - 1.3->1.5  Hospital day # 4  Rosalin Hawking, MD PhD Stroke Neurology 08/16/2015 6:33 PM     To contact Stroke Continuity provider, please refer to [email or web address] After hours, contact General Neurology

## 2015-08-16 NOTE — Progress Notes (Addendum)
Patient found on the floor by nursing staff.  Patient states "I slid onto the floor" patient denies hitting her head and denies physical injury at this time.  Assessed patient and could not find any signs of injury.  Pt. VSS with no s/s of distress noted.  Dr. Erlinda Hong notified of patients fall received no new orders at this time.  Chair alarm placed for patient safety.  Will continue to monitor.

## 2015-08-16 NOTE — Progress Notes (Signed)
Met for a little over 40 minutes with Nicole Valentine and her husband Nicole Valentine to discuss her overall situation. MRI shows apparent leptomeningeal spread of her cancer. She does not seem to have had a stroke. She is better on keppra and may benefit from steroids.  The implications of this however are devastating, especially as she has already had whole brain irradiation. It may be possible to do some additional WBI--I will discuss with Rad Onc--but it would be palliative at best. We can place an Ommaya reservoir and give intrathecal chemo--or [facility] expressed some interest in a study using lipid-encapsulated chemo to cross the blood brain barrier--but again those interventions would at best prolong the inevitable.   Meanwhile she also has significant disease involving chiefly the lungs. This has proved resistant to our best chemo and while we do have additional agents we can try--vinorelbine, for instance--chances of response are <20%  In a sense what we are discussing is whether the immediate cause of death should be the lungs or the brain. I don't think the ultimate outcome either way is in doublt.  Nicole Valentine was able to understand all this clearly and I gave him the information in writing. Nicole Valentine is A&O x3, but fell asleep during the discussion. We did agree she should not be resuscitated in case of a terminal event and I am writing a DNR order.  She is severely malnourished and this concerns the family. I don't anticipate that improving.Again, steroids may temporarily help.  I would favor proceeding with the R pleurx as that would allow [place] to optimize lung symptom palliation. If XRT is contemplated I would proceed to LP but not otherwise. I will meet w the patient and family tomorrow to decide whether the best course for her is supportive care only (goals being comfort and keeping the pt as fit as possible as long as possible) or life-prolonging treatment (brain XRT if possible, intrathecal chemo, and  peripheral chemo)  I also recommended [facility].  Greatly appreciate the entire team's help to this patient!

## 2015-08-16 NOTE — Progress Notes (Addendum)
      [address]       [place] [address]             [phone]      Subjective:  Feels better after Thoracentesis today.  Objective: Vital signs in last 24 hours: Temp:  [97.6 F (36.4 C)-98.3 F (36.8 C)] 97.6 F (36.4 C) (03/27 1206) Pulse Rate:  [100-111] 105 (03/27 1206) Cardiac Rhythm:  [-] Sinus tachycardia (03/27 1206) Resp:  [13-28] 21 (03/27 1206) BP: (133-153)/(79-97) 143/89 mmHg (03/27 1206) SpO2:  [98 %-100 %] 100 % (03/27 1206)  Intake/Output from previous day: 03/26 0701 - 03/27 0700 In: 2100 [P.O.:300; I.V.:1800] Out: -  Intake/Output this shift: Total I/O In: 675 [I.V.:675] Out: -   General appearance: alert, cooperative and no distress Heart: regular rate and rhythm Lungs: diminished breath sounds right base  Lab Results:  Recent Labs  08/15/15 0530 08/16/15 0329  WBC 5.0 5.8  HGB 9.7* 9.0*  HCT 30.0* 27.6*  PLT 225 194   BMET:  Recent Labs  08/14/15 0845 08/16/15 0329  NA 136 138  K 3.6 3.0*  CL 94* 93*  CO2 30 33*  GLUCOSE 89 92  BUN 9 6  CREATININE 0.54 0.52  CALCIUM 8.5* 8.3*    PT/INR:  Recent Labs  08/16/15 0644  LABPROT 20.9*  INR 1.81*   ABG    Component Value Date/Time   TCO2 34 08/12/2015 1221   CBG (last 3)  No results for input(s): GLUCAP in the last 72 hours.  Assessment/Plan:  1. Large Right Pleural Effusion- plan for Pleur-x tomorrow 2. INR elevated at 1.81, repeat in AM 3. Care per primary   LOS: 4 days    Ellwood Handler 08/16/2015  Plan placement of right pleurix tomorrow , Patient and her husband that this is for pallative care only and does not treat the underlying cause. If breathing improves can consider left side but will not do at the same time I have seen and examined Nicole Valentine and agree with the above assessment  and plan. The goals risks and alternatives of the planned surgical procedure right pleurix   have been discussed with the patient in detail. The  risks of the procedure including death, infection, stroke, myocardial infarction, bleeding, blood transfusion have all been discussed specifically.  I have quoted Nicole Valentine a 5 % of perioperative mortality and a complication rate as high as 40  %. The patient's questions have been answered.Nicole Valentine is willing  to proceed with the planned procedure. [name] MD Beeper [phone] Office [phone] 08/16/2015 7:02 PM

## 2015-08-17 ENCOUNTER — Ambulatory Visit: Payer: BLUE CROSS/BLUE SHIELD

## 2015-08-17 ENCOUNTER — Other Ambulatory Visit (HOSPITAL_COMMUNITY): Payer: BLUE CROSS/BLUE SHIELD

## 2015-08-17 ENCOUNTER — Ambulatory Visit: Payer: BLUE CROSS/BLUE SHIELD | Admitting: Nurse Practitioner

## 2015-08-17 ENCOUNTER — Other Ambulatory Visit: Payer: BLUE CROSS/BLUE SHIELD

## 2015-08-17 ENCOUNTER — Inpatient Hospital Stay (HOSPITAL_COMMUNITY): Payer: BLUE CROSS/BLUE SHIELD

## 2015-08-17 ENCOUNTER — Ambulatory Visit (HOSPITAL_COMMUNITY)
Admission: RE | Admit: 2015-08-17 | Payer: BLUE CROSS/BLUE SHIELD | Source: Ambulatory Visit | Admitting: Cardiothoracic Surgery

## 2015-08-17 ENCOUNTER — Inpatient Hospital Stay (HOSPITAL_COMMUNITY): Payer: BLUE CROSS/BLUE SHIELD | Admitting: Anesthesiology

## 2015-08-17 ENCOUNTER — Encounter (HOSPITAL_COMMUNITY): Admission: EM | Disposition: A | Discharge status: Hospice | Payer: Self-pay | Attending: Neurology

## 2015-08-17 DIAGNOSIS — E876 Hypokalemia: Secondary | ICD-10-CM

## 2015-08-17 HISTORY — PX: CHEST TUBE INSERTION: SHX231

## 2015-08-17 LAB — COMPREHENSIVE METABOLIC PANEL
ALT: 6 U/L — ABNORMAL LOW (ref 14–54)
AST: 14 U/L — ABNORMAL LOW (ref 15–41)
Albumin: 1.5 g/dL — ABNORMAL LOW (ref 3.5–5.0)
Alkaline Phosphatase: 56 U/L (ref 38–126)
Anion gap: 10 (ref 5–15)
BUN: 8 mg/dL (ref 6–20)
CO2: 27 mmol/L (ref 22–32)
Calcium: 6.9 mg/dL — ABNORMAL LOW (ref 8.9–10.3)
Chloride: 104 mmol/L (ref 101–111)
Creatinine, Ser: 0.44 mg/dL (ref 0.44–1.00)
GFR calc Af Amer: >60 mL/min (ref >60)
GFR calc non Af Amer: >60 mL/min (ref >60)
Glucose, Bld: 74 mg/dL (ref 65–99)
Potassium: 2.5 mmol/L — CL (ref 3.5–5.1)
Sodium: 141 mmol/L (ref 135–145)
Total Bilirubin: 0.8 mg/dL (ref 0.3–1.2)
Total Protein: 4.5 g/dL — ABNORMAL LOW (ref 6.5–8.1)

## 2015-08-17 LAB — PROTIME-INR
INR: 1.77 — AB (ref 0.00–1.49)
PROTHROMBIN TIME: 20.6 s — AB (ref 11.6–15.2)

## 2015-08-17 LAB — CBC
HCT: 24.9 % — ABNORMAL LOW (ref 36.0–46.0)
Hemoglobin: 7.7 g/dL — ABNORMAL LOW (ref 12.0–15.0)
MCH: 32.9 pg (ref 26.0–34.0)
MCHC: 30.9 g/dL (ref 30.0–36.0)
MCV: 106.4 fL — ABNORMAL HIGH (ref 78.0–100.0)
Platelets: 168 10*3/uL (ref 150–400)
RBC: 2.34 MIL/uL — ABNORMAL LOW (ref 3.87–5.11)
RDW: 18.5 % — ABNORMAL HIGH (ref 11.5–15.5)
WBC: 6.3 10*3/uL (ref 4.0–10.5)

## 2015-08-17 LAB — BASIC METABOLIC PANEL
Anion gap: 13 (ref 5–15)
BUN: 10 mg/dL (ref 6–20)
CHLORIDE: 94 mmol/L — AB (ref 101–111)
CO2: 32 mmol/L (ref 22–32)
CREATININE: 0.54 mg/dL (ref 0.44–1.00)
Calcium: 8.4 mg/dL — ABNORMAL LOW (ref 8.9–10.3)
GFR calc Af Amer: >60 mL/min (ref >60)
GFR calc non Af Amer: >60 mL/min (ref >60)
Glucose, Bld: 80 mg/dL (ref 65–99)
Potassium: 3 mmol/L — ABNORMAL LOW (ref 3.5–5.1)
SODIUM: 139 mmol/L (ref 135–145)

## 2015-08-17 LAB — APTT: aPTT: 46 seconds — ABNORMAL HIGH (ref 24–37)

## 2015-08-17 SURGERY — INSERTION, PLEURAL DRAINAGE CATHETER
Anesthesia: Monitor Anesthesia Care | Site: Chest | Laterality: Right

## 2015-08-17 MED ORDER — FENTANYL 75 MCG/HR TD PT72
75.0000 ug | MEDICATED_PATCH | TRANSDERMAL | Status: DC
  Administered 2015-08-17: 75 ug via TRANSDERMAL
  Filled 2015-08-17 (×2): qty 1

## 2015-08-17 MED ORDER — ONDANSETRON HCL 4 MG/2ML IJ SOLN: 4.0000 mg | Freq: Once | INTRAMUSCULAR | Status: DC | PRN

## 2015-08-17 MED ORDER — POTASSIUM CHLORIDE 10 MEQ/100ML IV SOLN
10.0000 meq | INTRAVENOUS | Status: AC
  Administered 2015-08-17: 10 meq via INTRAVENOUS
  Filled 2015-08-17: qty 100

## 2015-08-17 MED ORDER — LIDOCAINE HCL (PF) 1 % IJ SOLN
INTRAMUSCULAR | Status: DC | PRN
  Administered 2015-08-17: 9 mL

## 2015-08-17 MED ORDER — NAPROXEN 250 MG PO TABS
500.0000 mg | ORAL_TABLET | Freq: Two times a day (BID) | ORAL | Status: DC | PRN
  Administered 2015-08-17 (×2): 500 mg via ORAL
  Filled 2015-08-17 (×2): qty 2

## 2015-08-17 MED ORDER — POTASSIUM CHLORIDE CRYS ER 20 MEQ PO TBCR
40.0000 meq | EXTENDED_RELEASE_TABLET | Freq: Two times a day (BID) | ORAL | Status: DC
  Administered 2015-08-17 (×2): 40 meq via ORAL
  Filled 2015-08-17 (×2): qty 2

## 2015-08-17 MED ORDER — MIDAZOLAM HCL 5 MG/5ML IJ SOLN
INTRAMUSCULAR | Status: DC | PRN
  Administered 2015-08-17: 1 mg via INTRAVENOUS

## 2015-08-17 MED ORDER — 0.9 % SODIUM CHLORIDE (POUR BTL) OPTIME
TOPICAL | Status: DC | PRN
  Administered 2015-08-17: 1000 mL

## 2015-08-17 MED ORDER — FENTANYL CITRATE (PF) 100 MCG/2ML IJ SOLN
INTRAMUSCULAR | Status: DC | PRN
  Administered 2015-08-17 (×3): 25 ug via INTRAVENOUS

## 2015-08-17 MED ORDER — FENTANYL CITRATE (PF) 100 MCG/2ML IJ SOLN: 25.0000 ug | INTRAMUSCULAR | Status: DC | PRN

## 2015-08-17 MED ORDER — PROPOFOL 500 MG/50ML IV EMUL
INTRAVENOUS | Status: DC | PRN
  Administered 2015-08-17: 100 ug/kg/min via INTRAVENOUS

## 2015-08-17 MED ORDER — LACTATED RINGERS IV SOLN
INTRAVENOUS | Status: DC | PRN
  Administered 2015-08-17: 07:00:00 via INTRAVENOUS

## 2015-08-17 SURGICAL SUPPLY — 34 items
BRUSH SCRUB EZ PLAIN DRY (MISCELLANEOUS) ×2 IMPLANT
CANISTER SUCTION 2500CC (MISCELLANEOUS) ×2 IMPLANT
COVER PROBE W GEL 5X96 (DRAPES) ×2 IMPLANT
COVER SURGICAL LIGHT HANDLE (MISCELLANEOUS) ×2 IMPLANT
COVER TRANSDUCER ULTRASND GEL (DRAPE) ×2 IMPLANT
DERMABOND ADVANCED (GAUZE/BANDAGES/DRESSINGS) ×1
DERMABOND ADVANCED .7 DNX12 (GAUZE/BANDAGES/DRESSINGS) ×1 IMPLANT
DRAPE C-ARM 42X72 X-RAY (DRAPES) ×2 IMPLANT
DRAPE LAPAROSCOPIC ABDOMINAL (DRAPES) ×2 IMPLANT
GLOVE BIO SURGEON STRL SZ 6.5 (GLOVE) ×4 IMPLANT
GLOVE BIOGEL PI IND STRL 6.5 (GLOVE) ×1 IMPLANT
GLOVE BIOGEL PI IND STRL 7.0 (GLOVE) ×1 IMPLANT
GLOVE BIOGEL PI INDICATOR 6.5 (GLOVE) ×1
GLOVE BIOGEL PI INDICATOR 7.0 (GLOVE) ×1
GLOVE SURG SS PI 7.0 STRL IVOR (GLOVE) ×2 IMPLANT
GOWN STRL REUS W/ TWL LRG LVL3 (GOWN DISPOSABLE) ×2 IMPLANT
GOWN STRL REUS W/ TWL XL LVL3 (GOWN DISPOSABLE) ×1 IMPLANT
GOWN STRL REUS W/TWL LRG LVL3 (GOWN DISPOSABLE) ×2
GOWN STRL REUS W/TWL XL LVL3 (GOWN DISPOSABLE) ×1
KIT BASIN OR (CUSTOM PROCEDURE TRAY) ×2 IMPLANT
KIT PLEURX DRAIN CATH 1000ML (MISCELLANEOUS) ×2 IMPLANT
KIT PLEURX DRAIN CATH 15.5FR (DRAIN) ×2 IMPLANT
KIT PLEURX PERI CATH 15.5FR (Catheter) ×2 IMPLANT
KIT ROOM TURNOVER OR (KITS) ×2 IMPLANT
NS IRRIG 1000ML POUR BTL (IV SOLUTION) ×2 IMPLANT
PACK GENERAL/GYN (CUSTOM PROCEDURE TRAY) ×2 IMPLANT
PAD ARMBOARD 7.5X6 YLW CONV (MISCELLANEOUS) ×4 IMPLANT
SET DRAINAGE LINE (MISCELLANEOUS) IMPLANT
SUT ETHILON 3 0 FSL (SUTURE) ×2 IMPLANT
SUT VIC AB 3-0 X1 27 (SUTURE) ×2 IMPLANT
TOWEL OR 17X24 6PK STRL BLUE (TOWEL DISPOSABLE) ×2 IMPLANT
TOWEL OR 17X26 10 PK STRL BLUE (TOWEL DISPOSABLE) ×2 IMPLANT
VALVE REPLACEMENT CAP (MISCELLANEOUS) IMPLANT
WATER STERILE IRR 1000ML POUR (IV SOLUTION) IMPLANT

## 2015-08-17 NOTE — Discharge Summary (Signed)
Stroke Discharge Summary  Patient ID: Nicole Valentine   MRN: [id number]      DOB: 1966-02-21  Date of Admission: 08/12/2015 Date of Discharge: 08/18/2015  Attending Physician:  Rosalin Hawking, MD, Stroke MD Consulting Physician(s):   Treatment Team:  [name], MD [name], MD (Oncology)  Patient's PCP:  [name], MD  DISCHARGE DIAGNOSIS:  Principal Problem:   Breast cancer metastasized to bone [facility]) Active Problems:   Brain metastases ([name])   Bone metastases ([name])   CVA (cerebral infarction)   Change in mental status   Expressive aphasia   Metastasis ([name])   Pleural effusion, left   Seizures ([name])   Metastatic breast cancer ([name])   Dyspnea   Encounter for palliative care   Goals of care, counseling/discussion   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  Past Medical History  Diagnosis Date  . Complication of anesthesia     her father and uncle both had very difficult time waking up-was  something they told them may be hereditory. she will find out.  [name] Hypothyroidism   . 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 Radiation 01/21/13-03/06/13    Right Breast  . Breast cancer ([name]) dx'd 04-10-12-rt   Past Surgical History  Procedure Laterality Date  . Wisdom tooth extraction    . Portacath placement  04/22/2012    Procedure: INSERTION PORT-A-CATH;  Surgeon: [name], MD;  Location: [place];  Service: General;  Laterality: Left;  [name] Modified mastectomy Right 11/18/2012    Procedure:  RIGHT MODIFIED MASTECTOMY;  Surgeon: [name], MD;  Location: [place];  Service: General;  Laterality: Right;  . Knee arthroscopy Right 11/26/2013  . Port-a-cath removal Left 12/10/2013    Procedure: MINOR REMOVAL PORT-A-CATH;  Surgeon: [name], MD;  Location: [place];  Service: General;  Laterality: Left;  . Chest tube insertion Right  08/17/2015    Procedure: INSERTION PLEURAL DRAINAGE CATHETER RIGHT CHEST;  Surgeon: [name], MD;  Location: [place];  Service: Thoracic;  Laterality: Right;      Medication List    TAKE these medications        cholecalciferol 1000 units tablet  Commonly known as:  VITAMIN D  Take 2,000 Units by mouth 2 (two) times daily.     CLARITIN-D 24 HOUR PO  Take 1 tablet by mouth every morning. Reported on 08/10/2015     dexamethasone 4 MG tablet  Commonly known as:  DECADRON  Take 1 tablet (4 mg total) by mouth daily.     dextromethorphan-guaiFENesin 30-600 MG 12hr tablet  Commonly known as:  MUCINEX DM  Take 1 tablet by mouth 2 (two) times daily as needed for cough. Reported on 08/10/2015     fentaNYL 75 MCG/HR  Commonly known as:  DURAGESIC - dosed mcg/hr  Place 1 patch (75 mcg total) onto the skin every 3 (three) days.     fluticasone 50 MCG/ACT nasal spray  Commonly known as:  FLONASE  Place 1 spray into both nostrils daily.     levETIRAcetam 500 MG 24 hr tablet  Commonly known as:  KEPPRA XR  Take 2 tablets (1,000 mg total) by mouth daily.     levothyroxine 175 MCG tablet  Commonly known as:  SYNTHROID, LEVOTHROID  Take 175 mcg by mouth daily before breakfast.  LORazepam 0.5 MG tablet  Commonly known as:  ATIVAN  Take 1 tablet (0.5 mg total) by mouth at bedtime as needed (Nausea or vomiting).     magnesium oxide 400 MG tablet  Commonly known as:  MAG-OX  Take 600 mg by mouth daily. Pt takes 2 capsules  by mouth 3 times daily.     megestrol 20 MG tablet  Commonly known as:  MEGACE  Take 1 tablet (20 mg total) by mouth 2 (two) times daily.     morphine CONCENTRATE 10 MG/0.5ML Soln concentrated solution  Take 0.5-1 mLs (10-20 mg total) by mouth every hour as needed for severe pain or shortness of breath.     naproxen 500 MG tablet  Commonly known as:  NAPROSYN  Take 1 tablet (500 mg total) by mouth 2 (two) times daily as needed for mild pain or moderate  pain.     oxyCODONE 10 mg 12 hr tablet  Commonly known as:  OXYCONTIN  Take 1 tablet (10 mg total) by mouth every 12 (twelve) hours.     oxyCODONE 5 MG immediate release tablet  Commonly known as:  Oxy IR/ROXICODONE  Take 1 tablet (5 mg total) by mouth every 6 (six) hours as needed for severe pain.     pantoprazole 40 MG tablet  Commonly known as:  PROTONIX  Take 1 tablet (40 mg total) by mouth daily at 12 noon.     Saline 0.2 % Gel  Place 1 Squirt into the nose at bedtime.     sodium chloride 0.65 % Soln nasal spray  Commonly known as:  OCEAN  Place 1 spray into both nostrils QID.        LABORATORY STUDIES CBC    Component Value Date/Time   WBC 9.3 08/18/2015 0401   WBC 7.4 08/10/2015 1428   RBC 2.70* 08/18/2015 0401   RBC 2.96* 08/10/2015 1428   HGB 8.9* 08/18/2015 0401   HGB 10.3* 08/10/2015 1428   HCT 29.0* 08/18/2015 0401   HCT 31.1* 08/10/2015 1428   PLT 217 08/18/2015 0401   PLT 323 08/10/2015 1428   MCV 107.4* 08/18/2015 0401   MCV 105.3* 08/10/2015 1428   MCH 33.0 08/18/2015 0401   MCH 34.9* 08/10/2015 1428   MCHC 30.7 08/18/2015 0401   MCHC 33.1 08/10/2015 1428   RDW 18.5* 08/18/2015 0401   RDW 20.3* 08/10/2015 1428   LYMPHSABS 0.3* 08/12/2015 1218   LYMPHSABS 0.3* 08/10/2015 1428   MONOABS 1.0 08/12/2015 1218   MONOABS 0.7 08/10/2015 1428   EOSABS 0.0 08/12/2015 1218   EOSABS 0.0 08/10/2015 1428   BASOSABS 0.0 08/12/2015 1218   BASOSABS 0.0 08/10/2015 1428   CMP    Component Value Date/Time   NA 139 08/18/2015 0401   NA 138 08/10/2015 1428   K 4.3 08/18/2015 0401   K 3.7 08/10/2015 1428   CL 97* 08/18/2015 0401   CL 101 10/18/2012 0859   CO2 31 08/18/2015 0401   CO2 34* 08/10/2015 1428   GLUCOSE 90 08/18/2015 0401   GLUCOSE 108 08/10/2015 1428   GLUCOSE 83 10/18/2012 0859   BUN 12 08/18/2015 0401   BUN 13.5 08/10/2015 1428   CREATININE 0.51 08/18/2015 0401   CREATININE 0.6 08/10/2015 1428   CALCIUM 8.5* 08/18/2015 0401   CALCIUM 9.4  08/10/2015 1428   PROT 4.5* 08/17/2015 0150   PROT 6.6 08/10/2015 1428   ALBUMIN 1.5* 08/17/2015 0150   ALBUMIN 2.4* 08/10/2015 1428   AST 14* 08/17/2015 0150  AST 17 08/10/2015 1428   ALT 6* 08/17/2015 0150   ALT <9 08/10/2015 1428   ALKPHOS 56 08/17/2015 0150   ALKPHOS 105 08/10/2015 1428   BILITOT 0.8 08/17/2015 0150   BILITOT 0.59 08/10/2015 1428   GFRNONAA >60 08/18/2015 0401   GFRAA >60 08/18/2015 0401   COAGS Lab Results  Component Value Date   INR 1.77* 08/17/2015   INR 1.81* 08/16/2015   INR 1.34 08/12/2015   Lipid Panel    Component Value Date/Time   CHOL 175 08/13/2015 0455   TRIG 131 08/13/2015 0455   HDL 24* 08/13/2015 0455   CHOLHDL 7.3 08/13/2015 0455   VLDL 26 08/13/2015 0455   LDLCALC 125* 08/13/2015 0455   HgbA1C  Lab Results  Component Value Date   HGBA1C 5.1 08/13/2015   Cardiac Panel (last 3 results) No results for input(s): CKTOTAL, CKMB, TROPONINI, RELINDX in the last 72 hours. Urinalysis    Component Value Date/Time   COLORURINE YELLOW 08/12/2015 1451   APPEARANCEUR CLOUDY* 08/12/2015 1451   LABSPEC 1.021 08/12/2015 1451   LABSPEC 1.010 05/19/2015 1521   PHURINE 6.0 08/12/2015 1451   PHURINE 6.0 05/19/2015 1521   GLUCOSEU NEGATIVE 08/12/2015 1451   GLUCOSEU Negative 05/19/2015 1521   HGBUR TRACE* 08/12/2015 1451   HGBUR Moderate 05/19/2015 1521   BILIRUBINUR NEGATIVE 08/12/2015 1451   BILIRUBINUR Negative 05/19/2015 1521   KETONESUR 15* 08/12/2015 1451   KETONESUR Negative 05/19/2015 1521   PROTEINUR NEGATIVE 08/12/2015 1451   PROTEINUR Negative 05/19/2015 1521   UROBILINOGEN 0.2 05/19/2015 1521   UROBILINOGEN 0.2 11/18/2014 0226   NITRITE NEGATIVE 08/12/2015 1451   NITRITE Negative 05/19/2015 1521   LEUKOCYTESUR SMALL* 08/12/2015 1451   LEUKOCYTESUR Moderate 05/19/2015 1521   Urine Drug Screen     Component Value Date/Time   LABOPIA POSITIVE* 08/12/2015 1451   COCAINSCRNUR NONE DETECTED 08/12/2015 1451   LABBENZ NONE  DETECTED 08/12/2015 1451   AMPHETMU NONE DETECTED 08/12/2015 1451   THCU NONE DETECTED 08/12/2015 1451   LABBARB NONE DETECTED 08/12/2015 1451    Alcohol Level    Component Value Date/Time   ETH <5 08/12/2015 1218     SIGNIFICANT DIAGNOSTIC STUDIES Chest x-ray  08/15/2015 1. Reduction in volume of LEFT pleural effusion following thoracentesis. No pneumothorax. 2. Persistent large RIGHT pleural effusion.  Ct Angio Head and Neck W/cm &/or Wo Cm 08/12/2015 Negative for large vessel occlusion. No change from earlier today. No significant carotid or vertebral artery stenosis. Findings compatible with metastatic disease in the chest.   08/12/2015  1. Negative for emergent large vessel occlusion. 2. Suboptimal arterial contrast bolus which appears related to left innominate vein stenosis. This limits detail of the second order and distal circle of Willis branches.  3. No arterial stenosis in the neck.  4. Abnormal chest findings with mediastinal lymphadenopathy, large layering left and large loculated appearing right pleural effusions. Right upper lobe consolidation and septal thickening.  5. No metastatic disease identified in the neck.   Ct Head Wo Contrast 08/12/2015 Negative CT of the head. Negative for intracranial hemorrhage. No acute infarct.  08/12/2015 No acute intracranial pathology.   MRI Brain with and without Contrast 08/13/2015 No acute infarction. 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 Few small areas of involvement possible in the supratentorial region as well.  2D Echocardiogram  - Left ventricle: The cavity size was normal. Wall thickness as normal. The estimated ejection fraction was 55%. Wall motion  was normal; there were no regional wall motion abnormalities. Indeterminant diastolic function. - Aortic valve: There was no stenosis. - Mitral valve: There was no  significant regurgitation. - Right ventricle: The cavity size was normal. Systolic function was normal. - Pulmonary arteries: No complete TR doppler jet so unable to estimate PA systolic pressure. - Systemic veins: The IVC was not visualized. - Pericardium, extracardiac: A small circumferential pericardial effusion was present. Impressions: Normal LV size with EF 55%. Normal RV size and systolic function. No significant valvular abnormality. Small circumferential pericardial effusion.  EEG: This EEG is abnormal with mild generalized nonspecific slowing of cerebral activity. No evidence of seizure activity was recorded. However, the absence of seizure activity on an interictal EEG recording does not rule out seizure disorder.      HISTORY OF PRESENT ILLNESS Toshiye Kever is an 50 y.o. female who has extensive cancer history. She was to go on a trip with her husband today when at 1000 08/12/2015 she was noted to suddenly have difficulty expressing herself. She was brought to [facility] hospital as a code stroke. She was noted to have difficulty expressing herself but was able to follow commands and move all extremities well. CT was negative and CTA head and neck showed no large branch occlusion. tPA was offered and husband wanted to go forward. Patient was administered IV t-PA. Afterwards, she had neuro worsening with increased expressive and receptive aphasia with worsening right sided neglect. Had stat CTA which showed no LVO. She was admitted to the neuro ICU for further evaluation and treatment.    HOSPITAL COURSE Ms. Rushie Brazel is a 50 y.o. female with extensive metastasis breast cancer history with presenting with expressive aphasia. She received IV t-PA 08/12/2015 at 1252. CTA d/t neuro worsening post tPA administration with no LVO. Pt symptoms fluctuate and concerning for seizure. MRI concerning for brain mets.   Possible seizure presenting as a stroke mimic secondary to leptomeningeal  metastasis, s/p IV tPA for suspected stroke on admission  Resultant No neurologic deficts   CTA no large vessel occlusion  Repeat CTA with no LVO  CTA neck no large vessel occlusion  Repeat CTA neck with no large vessel occlusion  2D Echo done 08/04/2015 No source of embolus  MRI brain with and without contrast - No infarct - leptomeningeal metastic disease noted.  EEG no seizure activity  Continue Keppra 1000 mg XR daily  LDL 125  HgbA1c 5.1  SCDs for VTE prophylaxis  Diet regular Room service appropriate?: Yes; Fluid consistency:: Thin  No antithrombotic prior to admission, now on No antithrombotic   Currently not on antiplatelet therapy or statin - but no documented stroke.  Stroke workup is complete  Therapy recommendations: HH OT, HH PT   Disposition: home hospice  Hyperlipidemia  Home meds: No statin  LDL 125  Hold off statin for home hospice placement  Breast cancer with lung and brain mets  New leptomeningeal metastasis devastating per oncology  Lung metastases, resistant to chemotherapy. Now with bilateral pleural effusions. right pleurix drained placed 3/28 to optimize long symptom palliation  cancer is terminal  Patient made DO NOT RESUSCITATE this a.m.  Severely malnourished, Body mass index is 20.71 kg/(m^2).   Dr. Ron Agee met with family - they desire comfort/palliative care at this point  Life expectancy: < 2 weeks  SW consulted for hospice referral and [place] transfer - medically ready for transfer - will keep in stepdown until timing of bed availabile - discussed transfer to  6[address] with husband. Husband plans to talk to his kids today (59, 2 older, 2 younger)  Other Active Problems  Hypothyroid, on replacement  Anemiatechnically limited and difficult  Tachycardia   Low magnesium levels - supplement - 1.3->1.5 Hypokalemia, K 2.5, replaced. Questioned contaminated sample. then k this am 3.0 - will replace  PO  DISCHARGE EXAM Blood pressure 126/83, pulse 137, temperature 97.5 F (36.4 C), temperature source Oral, resp. rate 20, height '5\' 7"'  (1.702 m), weight 60 kg (132 lb 4.4 oz), last menstrual period 03/29/2012, SpO2 98 %. Pleasant middle aged caucasian lady not in distress. Lethargic and sleepy between answering questions and tasks  Afebrile.   HEENT: Head is nontraumatic. Neck is supple without bruit. Patient wearing head covering.  Cardiac exam no murmur or gallop.  Lungs - decreased breath sounds on the right with occasional end-expiratory wheeze. Good air movement on left with diffuse rhonchi. Abd: No distention, Normal bowel sounds Extrem: Distal pulses are well felt.  Neurological Exam ;  Mental Status: Lethargic oriented x 3. Normal speech and language. Speech is fluent  Cranial Nerves: PERRL, eye movements full without nystagmus. Hearing is grossly normal. Face symmetric. Shrug symmetric . Motor: Normal strength, non-focal   Sensation: Intact to LT  Coordination: Finger to nose intact; NT in legs due to lethargy  Gait deferred.   Discharge Diet   Diet regular Room service appropriate?: Yes; Fluid consistency:: Thin liquids  DISCHARGE PLAN  Disposition:  Home with Home Hospice   Follow-up with [facility]  40 minutes were spent preparing discharge.  [place] for Pager information 08/18/2015 3:20 PM   I, the attending vascular neurologist, have personally obtained a history, examined the patient, evaluated laboratory data, individually viewed imaging studies and agree with radiology interpretations. I also discussed with pt's husband regarding her care plan. Together with the NP/PA, we formulated the assessment and plan of care which reflects our mutual decision.  I have made any additions or clarifications directly to the above note and agree with the findings and plan as currently documented.    Rosalin Hawking, MD PhD Stroke Neurology 08/18/2015 4:28 PM

## 2015-08-17 NOTE — Care Management Note (Signed)
Case Management Note  Patient Details  Name: Nicole Valentine MRN: [id number] Date of Birth: 20-Mar-1966  Subjective/Objective:    Patient has decided to go to residential hospice, CSW following.                Action/Plan:   Expected Discharge Date:                  Expected Discharge Plan:  [place]  In-House Referral:  Clinical Social Work  Discharge planning Services  CM Consult  Post Acute Care Choice:  Hospice Choice offered to:     DME Arranged:    DME Agency:     HH Arranged:    [place] Agency:     Status of Service:  Completed, signed off  Medicare Important Message Given:    Date Medicare IM Given:    Medicare IM give by:    Date Additional Medicare IM Given:    Additional Medicare Important Message give by:     If discussed at [place] of Stay Meetings, dates discussed:    Additional Comments:  Zenon Mayo, RN 08/17/2015, 3:16 PM

## 2015-08-17 NOTE — Progress Notes (Signed)
SLP Cancellation Note  Patient Details Name: Nicole Valentine MRN: [id number] DOB: [birth year]   Cancelled treatment:       Reason Eval/Treat Not Completed: Other (comment), Procedure today. Pt transitioning to comfort care. Will d/c SLP orders. Please reorder if needs arise.   Herbie [place], [place] CCC-SLP [phone]  Lynann Beaver 08/17/2015, 2:12 PM

## 2015-08-17 NOTE — Progress Notes (Signed)
Patient's husband spoke with the Liaison from [facility] and the patient's family has decided they would like to take patient home with hospice.

## 2015-08-17 NOTE — Transfer of Care (Signed)
Immediate Anesthesia Transfer of Care Note  Patient: Nicole Valentine  Procedure(s) Performed: Procedure(s): INSERTION PLEURAL DRAINAGE CATHETER RIGHT CHEST (Right)  Patient Location: PACU  Anesthesia Type:MAC  Level of Consciousness: sedated  Airway & Oxygen Therapy: Patient Spontanous Breathing and Patient connected to face mask oxygen  Post-op Assessment: Report given to RN and Post -op Vital signs reviewed and stable  Post vital signs: Reviewed and stable  Last Vitals:  Filed Vitals:   08/17/15 0412 08/17/15 0828  BP: 123/75   Pulse: 105 110  Temp:  36.4 C  Resp: 18 18    Complications: No apparent anesthesia complications

## 2015-08-17 NOTE — Anesthesia Preprocedure Evaluation (Addendum)
Anesthesia Evaluation  Patient identified by MRN, date of birth, ID band Patient awake    Reviewed: Allergy & Precautions, H&P , NPO status , Patient's Chart, lab work & pertinent test results  History of Anesthesia Complications (+) Family history of anesthesia reaction and history of anesthetic complications (father and uncle had reaction to muscle relaxant, prolonged emergence (pseudocholinesterase deficiency?))  Airway Mallampati: I  TM Distance: >3 FB Neck ROM: Full    Dental no notable dental hx. (+) Teeth Intact, Dental Advisory Given   Pulmonary neg pulmonary ROS,    Pulmonary exam normal breath sounds clear to auscultation       Cardiovascular Normal cardiovascular exam Rhythm:Regular Rate:Normal  Normal LV size with EF 55%. Normal RV size and systolic function. No significant valvular abnormality   Neuro/Psych negative neurological ROS  negative psych ROS   GI/Hepatic negative GI ROS, Neg liver ROS,   Endo/Other  Hypothyroidism (on replacement)   Renal/GU negative Renal ROS     Musculoskeletal   Abdominal   Peds  Hematology   Anesthesia Other Findings   Reproductive/Obstetrics                            Anesthesia Physical  Anesthesia Plan  ASA: IV  Anesthesia Plan: MAC   Post-op Pain Management:    Induction: Intravenous  Airway Management Planned: Simple Face Mask  Additional Equipment:   Intra-op Plan:   Post-operative Plan:   Informed Consent: I have reviewed the patients History and Physical, chart, labs and discussed the procedure including the risks, benefits and alternatives for the proposed anesthesia with the patient or authorized representative who has indicated his/her understanding and acceptance.   Dental advisory given  Plan Discussed with: CRNA and Surgeon  Anesthesia Plan Comments: (Critically ill with brain and lung mestastasis from breast cancer,  patient is DNR)      Anesthesia Quick Evaluation

## 2015-08-17 NOTE — Progress Notes (Signed)
Lab called with critical potassium of 2.5 paged Dr. Leonel Ramsay  Paged he ordered 2 runs of Kcl and repeat  BMET will con tine to monitor

## 2015-08-17 NOTE — Progress Notes (Signed)
Physical Therapy Treatment Patient Details Name: Nicole Valentine MRN: [id number] DOB: 24-Oct-1965 Today's Date: 08/17/2015    History of Present Illness pt presents with speech deficits and was given tpa with initially improvement of symptoms, but then worsening of symptoms.  CT negative for acute changes.  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  Pt is status post full brain radiation     PT Comments    Pt performed increased gait distance but remains to require min-mod assist.  Pt presents with increased motivation and requesting to toilet in bathroom post ambulation.  HR elevated to 147 and remained between 122-157 during tx intervention.  Pt educated to use call bell for Back to bed transfer.    Follow Up Recommendations  Home health PT;Supervision/Assistance - 24 hour     Equipment Recommendations  Rolling walker with 5" wheels;3in1 (PT)    Recommendations for Other Services       Precautions / Restrictions Precautions Precautions: Fall Restrictions Weight Bearing Restrictions: No    Mobility  Bed Mobility Overal bed mobility: Needs Assistance Bed Mobility: Supine to Sit     Supine to sit: Mod assist;Min assist     General bed mobility comments: Pt required assist to advance to edge of bed with cues for sequencing and foot/hand placement.  Pt able to scoot edge of bed without assist once in sitting.  Pt appears more aware of lines/leads.    Transfers Overall transfer level: Needs assistance Equipment used: Rolling walker (2 wheeled) Transfers: Sit to/from Stand Sit to Stand: Min assist         General transfer comment: Pt performed sit to stand from bed x1, from recliner x1 and from commode x1.  pt required cues for hand placement to improve safety.  Pt performed with hands on RW pushing down through RW.    Ambulation/Gait Ambulation/Gait assistance: Min assist;Mod assist Ambulation Distance  (Feet): 100 Feet (30 ft x2 to and from bathroom.  pt insistent on ambulating to bathroom.  ) Assistive device: Rolling walker (2 wheeled) Gait Pattern/deviations: Step-through pattern;Ataxic;Narrow base of support;Drifts right/left;Scissoring     General Gait Details: Pt remains to present with scissor steps and required cues to increase BOS.  Pt remains to have poor safety awareness and RW position.  Require cues to slow down and maintain appropriate distance from RW.  Slow buckling noted required mod assist to correct.     Stairs            Wheelchair Mobility    Modified Rankin (Stroke Patients Only)       Balance     Sitting balance-Leahy Scale: Fair       Standing balance-Leahy Scale: Poor                      Cognition Arousal/Alertness: Awake/alert Behavior During Therapy: WFL for tasks assessed/performed Overall Cognitive Status: Impaired/Different from baseline Area of Impairment: Attention;Safety/judgement;Problem solving;Awareness       Following Commands: Follows one step commands with increased time Safety/Judgement: Decreased awareness of safety;Decreased awareness of deficits   Problem Solving: Slow processing;Decreased initiation;Difficulty sequencing      Exercises      General Comments        Pertinent Vitals/Pain Pain Assessment: No/denies pain    Home Living                      Prior  Function            PT Goals (current goals can now be found in the care plan section) Acute Rehab PT Goals Patient Stated Goal: Home Potential to Achieve Goals: Poor Progress towards PT goals: Progressing toward goals    Frequency  Min 4X/week    PT Plan Current plan remains appropriate    Co-evaluation             End of Session Equipment Utilized During Treatment: Gait belt;Oxygen Activity Tolerance: Patient limited by fatigue Patient left: in chair;with call bell/phone within reach;with family/visitor present  (alarm pad placed under patient)     Time: [id number] PT Time Calculation (min) (ACUTE ONLY): 40 min  Charges:  $Gait Training: 23-37 mins $Therapeutic Activity: 8-22 mins                    G Codes:      Cristela Blue 2015/08/28, 3:42 PM  Governor R[place], PTA pager [phone]

## 2015-08-17 NOTE — Anesthesia Postprocedure Evaluation (Signed)
Anesthesia Post Note  Patient: Nicole Valentine  Procedure(s) Performed: Procedure(s) (LRB): INSERTION PLEURAL DRAINAGE CATHETER RIGHT CHEST (Right)  Patient location during evaluation: SICU Anesthesia Type: MAC Level of consciousness: awake and alert Pain management: pain level controlled Vital Signs Assessment: post-procedure vital signs reviewed and stable Respiratory status: spontaneous breathing, nonlabored ventilation, respiratory function stable and patient connected to nasal cannula oxygen Cardiovascular status: blood pressure returned to baseline and stable Postop Assessment: no signs of nausea or vomiting Anesthetic complications: no    Last Vitals:  Filed Vitals:   08/17/15 0938 08/17/15 1056  BP: 120/71   Pulse: 110 108  Temp: 36.6 C   Resp:  24    Last Pain:  Filed Vitals:   08/17/15 1057  PainSc: 7                  Zenaida Deed

## 2015-08-17 NOTE — Progress Notes (Signed)
STROKE TEAM PROGRESS NOTE   SUBJECTIVE (INTERVAL HISTORY) Her husband is at the bedside. Pt just back from catheter placement. Requesting naproxen for pain management. Lost fentanyl patch - ok to replace. Family plans hospice referral and transition to [facility] once bed available.    OBJECTIVE Temp:  [97.5 F (36.4 C)-98.6 F (37 C)] 97.8 F (36.6 C) (03/28 0938) Pulse Rate:  [105-117] 110 (03/28 0938) Cardiac Rhythm:  [-] Sinus tachycardia (03/28 0900) Resp:  [0-28] 14 (03/28 0915) BP: (114-143)/(69-89) 120/71 mmHg (03/28 0938) SpO2:  [94 %-100 %] 94 % (03/28 0938)  CBC:   Recent Labs Lab 08/10/15 1428 08/12/15 1218  08/16/15 0329 08/17/15 0150  WBC 7.4 7.2  < > 5.8 6.3  NEUTROABS 6.3 5.9  --   --   --   HGB 10.3* 10.3*  < > 9.0* 7.7*  HCT 31.1* 31.8*  < > 27.6* 24.9*  MCV 105.3* 105.0*  < > 104.9* 106.4*  PLT 323 264  < > 194 168  < > = values in this interval not displayed.  Basic Metabolic Panel:   Recent Labs Lab 08/10/15 1427  08/14/15 0845 08/16/15 0329 08/17/15 0150 08/17/15 0700  NA  --   < > 136 138 141 139  K  --   < > 3.6 3.0* 2.5* 3.0*  CL  --   < > 94* 93* 104 94*  CO2  --   < > 30 33* 27 32  GLUCOSE  --   < > 89 92 74 80  BUN  --   < > _0 CREATININE  --   < > 0.54 0.52 0.44 0.54  CALCIUM  --   < > 8.5* 8.3* 6.9* 8.4*  MG  --   < > 1.3* 1.5*  --   --   PHOS 4.0  --  3.1  --   --   --   < > = values in this interval not displayed.  Lipid Panel:     Component Value Date/Time   CHOL 175 08/13/2015 0455   TRIG 131 08/13/2015 0455   HDL 24* 08/13/2015 0455   CHOLHDL 7.3 08/13/2015 0455   VLDL 26 08/13/2015 0455   LDLCALC 125* 08/13/2015 0455   HgbA1c:  Lab Results  Component Value Date   HGBA1C 5.1 08/13/2015   Urine Drug Screen:     Component Value Date/Time   LABOPIA POSITIVE* 08/12/2015 1451   COCAINSCRNUR NONE DETECTED 08/12/2015 1451   LABBENZ NONE DETECTED 08/12/2015 1451   AMPHETMU NONE DETECTED 08/12/2015 1451   THCU NONE DETECTED 08/12/2015 1451   LABBARB NONE DETECTED 08/12/2015 1451      IMAGING I have personally reviewed the radiological images below and agree with the radiology interpretations.  Chest x-ray  08/15/2015 1. Reduction in volume of LEFT pleural effusion following thoracentesis. No pneumothorax. 2. Persistent large RIGHT pleural effusion.  Ct Angio Head and Neck W/cm &/or Wo Cm 08/12/2015   Negative for large vessel occlusion. No change from earlier today. No significant carotid or vertebral artery stenosis. Findings compatible with metastatic disease in the chest.   08/12/2015    1. Negative for emergent large vessel occlusion.  2. Suboptimal arterial contrast bolus which appears related to left innominate vein stenosis. This limits detail of the second order and distal circle of Willis branches.  3. No arterial stenosis in the neck.  4. Abnormal chest findings with mediastinal lymphadenopathy, large layering left and large loculated appearing  right pleural effusions. Right upper lobe consolidation and septal thickening.  5. No metastatic disease identified in the neck.   Ct Head Wo Contrast 08/12/2015  Negative CT of the head. Negative for intracranial hemorrhage. No acute infarct.  08/12/2015   No acute intracranial pathology.   MRI Brain with and without Contrast 08/13/2015 No acute infarction. 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 Few small areas of involvement possible in the supratentorial region as well.  2D Echocardiogram  - Left ventricle: The cavity size was normal. Wall thickness as  normal. The estimated ejection fraction was 55%. Wall motion was normal; there were no regional wall motion abnormalities. Indeterminant diastolic function. - Aortic valve: There was no stenosis. - Mitral valve: There was no significant regurgitation. - Right ventricle: The cavity size was  normal. Systolic function was normal. - Pulmonary arteries: No complete TR doppler jet so unable to estimate PA systolic pressure. - Systemic veins: The IVC was not visualized. - Pericardium, extracardiac: A small circumferential pericardial effusion was present. Impressions:    Normal LV size with EF 55%. Normal RV size and systolic function. No significant valvular abnormality. Small circumferential pericardial effusion.  EEG: This EEG is abnormal with mild generalized nonspecific slowing of cerebral activity. No evidence of seizure activity was recorded. However, the absence of seizure activity on an interictal EEG recording does not rule out seizure disorder.    PHYSICAL EXAM Pleasant middle aged caucasian lady not in distress.  Lethargic and sleepy between answering questions and tasks  Afebrile.   HEENT:  Head is nontraumatic. Neck is supple without bruit.  Patient wearing head covering.     Cardiac exam no murmur or gallop.  Lungs - decreased breath sounds on the right with occasional end-expiratory wheeze. Good air movement on left with diffuse rhonchi. Abd:  No distention,  Normal bowel sounds Extrem:  Distal pulses are well felt.  Neurological Exam ;  Mental Status:  Lethargic oriented x 3. Normal speech and language.  Speech is fluent  Cranial Nerves:  PERRL, eye movements full without nystagmus.  Hearing is grossly normal. Face symmetric. Shrug symmetric . Motor:  Normal strength, non-focal   Sensation:  Intact to LT  Coordination:  Finger to nose intact; NT in legs due to lethargy  Gait deferred.   ASSESSMENT/PLAN [name] is a 50 y.o. female with extensive metastasis breast cancer history with presenting with expressive aphasia. She received IV t-PA 08/12/2015 at 1252. CTA d/t neuro worsening post tPA administration with no LVO. Pt symptoms fluctuate and concerning for seizure. MRI concerning for brain mets.   Possible seizure presenting as a stroke mimic  secondary to leptomeningeal metastasis, s/p IV tPA for suspected stroke on admission  Resultant  No neurologic deficts   CTA no large vessel occlusion  Repeat CTA with no LVO  CTA neck no large vessel occlusion  Repeat CTA neck with no large vessel occlusion  2D Echo done 08/04/2015  No source of embolus   MRI brain with and without contrast - No infarct - leptomeningeal metastic disease noted.  EEG no seizure activity  Continue Keppra 1000 mg XR daily  LDL 125  HgbA1c 5.1  SCDs for VTE prophylaxis Diet regular Room service appropriate?: Yes; Fluid consistency:: Thin  No antithrombotic prior to admission, now on No antithrombotic   Currently not on antiplatelet therapy or statin - but no documented stroke.  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 Now with bilateral pleural effusions. right pleurix drained placed 3/28 to optimize long symptom palliation  cancer is terminal  Patient made DO NOT RESUSCITATE this a.m.  Severely malnourished, Body mass index is 20.71 kg/(m^2).   Dr. Ron Agee met with family - they desire comfort/palliative care at this point  Life expectancy: < 2 weeks  SW consulted for hospice referral and [facility] transfer - medically ready for transfer - will keep in stepdown until timing of bed availabile - discussed transfer to [facility] with husband. Husband plans to talk to his kids today (71, 2 older, 2 younger)  Other Active Problems  Hypothyroid, on replacement   Anemiatechnically limited and difficult  Tachycardia   Low magnesium levels - supplement - 1.3->1.5  Hypokalemia, K 2.5, replaced. Questioned contaminated sample. then k this am  3.0 - will replace [facility] day # 5  Rosalin Hawking, MD PhD Stroke Neurology 08/17/2015 4:22 PM   To contact Stroke Continuity provider, please refer to [email or web address] After hours, contact General Neurology

## 2015-08-17 NOTE — Progress Notes (Signed)
I have reviewed Nicole Valentine's situation with Dr .[name] at [facility], Dr Wetzel Bjornstad at [facility], and Dr Isidore Moos in radiation oncology. All of [place] feel that while intrathecal chemotherapy and additional radiation might extend Nicole Valentine's life briefly, these interventions would not change the outcome and would worsen her quality of life and her ability to send time with family in her last few days. I have discussed this with Nicole Valentine and (through speaker phone) with Nicole Valentine's brother in [place]. They are clear that they desire comfort/palliative care only from this point.  In my opinion we have less than 2 weeks in terms of prognosis. I have alerted the family to visit now.  The family is interested in a hospice referral and transfer to [facility]. I will place a Social Work and [facility] referral and I will call [place] to alert them as well. I would anticipate a discharge sometime later this week or early next week depending on bed availability.  I will follow with you while in house. I greatly appreciate everyone's efforts on behalf of this patient and her family!

## 2015-08-17 NOTE — Anesthesia Procedure Notes (Signed)
Procedure Name: MAC Date/Time: 08/17/2015 7:40 AM Performed by: [place] Pink Pre-anesthesia Checklist: Patient identified, Emergency Drugs available, Suction available, Patient being monitored and Timeout performed Patient Re-evaluated:Patient Re-evaluated prior to inductionOxygen Delivery Method: Simple face mask Preoxygenation: Pre-oxygenation with 100% oxygen Intubation Type: IV induction Placement Confirmation: positive ETCO2 Dental Injury: Teeth and Oropharynx as per pre-operative assessment

## 2015-08-17 NOTE — Progress Notes (Signed)
Noted low potassium at 2.5, of note all of her labs have decreased, Hgb, calcium, protein, the notable exceptions being sodium and chloride which makes me suspect that this is a contaminated sample. That being said, her potassium was already low and I don't think that there would be any harm to giving 20 mEq IV. I will go ahead and recheck her labs, but get the potassium started in the interim. If this value is confirmed, more potassium will need to be ordered.  Roland Rack, MD [facility] [phone]  If 7pm- 7am, please page neurology on call as listed in [place].

## 2015-08-17 NOTE — Progress Notes (Signed)
CSW received phone call from MD that patient will need residential hospice and the family is requesting [employer]. CSW contacted [name] from [facility] to inform. Per [name], she will come and evaluate the patient, and then she will follow up with CSW regarding bed availability.    CSW will continue to follow and provide support to patient while in hospital.   Lucius Conn, [place] Worker [facility] Ph: [phone]

## 2015-08-17 NOTE — Brief Op Note (Signed)
      [address]       [place] [address]             [phone]       08/17/2015  8:20 AM  PATIENT:  Nicole Valentine  50 y.o. female  PRE-OPERATIVE DIAGNOSIS: Bilaterial  MALIGNANT PLEURAL EFFUSION  POST-OPERATIVE DIAGNOSIS: same PROCEDURE:  Procedure(s): INSERTION PLEURAL DRAINAGE CATHETER RIGHT CHEST (Right)  SURGEON:  Surgeon(s) and Role:    * [name], MD - Primary   ANESTHESIA:   MAC  EBL:     BLOOD ADMINISTERED:none  DRAINS: right pleurix drain   LOCAL MEDICATIONS USED:  LIDOCAINE   SPECIMEN:  No Specimen  DISPOSITION OF SPECIMEN:  N/A  COUNTS:  YES   DICTATION: .Dragon Dictation  PLAN OF CARE: already in patient  PATIENT DISPOSITION:  PACU - hemodynamically stable.   Delay start of Pharmacological VTE agent (>24hrs) due to surgical blood loss or risk of bleeding: yes

## 2015-08-18 ENCOUNTER — Encounter (HOSPITAL_COMMUNITY): Payer: Self-pay | Admitting: Cardiothoracic Surgery

## 2015-08-18 ENCOUNTER — Inpatient Hospital Stay (HOSPITAL_COMMUNITY): Payer: BLUE CROSS/BLUE SHIELD

## 2015-08-18 DIAGNOSIS — R0600 Dyspnea, unspecified: Secondary | ICD-10-CM

## 2015-08-18 DIAGNOSIS — E039 Hypothyroidism, unspecified: Secondary | ICD-10-CM

## 2015-08-18 DIAGNOSIS — Z7189 Other specified counseling: Secondary | ICD-10-CM | POA: Insufficient documentation

## 2015-08-18 DIAGNOSIS — C7949 Secondary malignant neoplasm of other parts of nervous system: Secondary | ICD-10-CM | POA: Diagnosis present

## 2015-08-18 DIAGNOSIS — E785 Hyperlipidemia, unspecified: Secondary | ICD-10-CM

## 2015-08-18 DIAGNOSIS — Z515 Encounter for palliative care: Secondary | ICD-10-CM | POA: Insufficient documentation

## 2015-08-18 LAB — BASIC METABOLIC PANEL
Anion gap: 11 (ref 5–15)
BUN: 12 mg/dL (ref 6–20)
CO2: 31 mmol/L (ref 22–32)
Calcium: 8.5 mg/dL — ABNORMAL LOW (ref 8.9–10.3)
Chloride: 97 mmol/L — ABNORMAL LOW (ref 101–111)
Creatinine, Ser: 0.51 mg/dL (ref 0.44–1.00)
GFR calc Af Amer: >60 mL/min (ref >60)
GFR calc non Af Amer: >60 mL/min (ref >60)
Glucose, Bld: 90 mg/dL (ref 65–99)
Potassium: 4.3 mmol/L (ref 3.5–5.1)
Sodium: 139 mmol/L (ref 135–145)

## 2015-08-18 LAB — CBC
HCT: 29 % — ABNORMAL LOW (ref 36.0–46.0)
Hemoglobin: 8.9 g/dL — ABNORMAL LOW (ref 12.0–15.0)
MCH: 33 pg (ref 26.0–34.0)
MCHC: 30.7 g/dL (ref 30.0–36.0)
MCV: 107.4 fL — ABNORMAL HIGH (ref 78.0–100.0)
Platelets: 217 10*3/uL (ref 150–400)
RBC: 2.7 MIL/uL — ABNORMAL LOW (ref 3.87–5.11)
RDW: 18.5 % — ABNORMAL HIGH (ref 11.5–15.5)
WBC: 9.3 10*3/uL (ref 4.0–10.5)

## 2015-08-18 MED ORDER — MORPHINE SULFATE (CONCENTRATE) 10 MG/0.5ML PO SOLN: 10.0000 mg | ORAL | Status: DC | PRN

## 2015-08-18 MED ORDER — DIAZEPAM 5 MG PO TABS: 5.0000 mg | ORAL_TABLET | Freq: Three times a day (TID) | ORAL | Status: DC | PRN

## 2015-08-18 MED ORDER — NAPROXEN 500 MG PO TABS: 500.0000 mg | ORAL_TABLET | Freq: Two times a day (BID) | ORAL | Status: AC | PRN

## 2015-08-18 MED ORDER — POLYETHYLENE GLYCOL 3350 17 G PO PACK
17.0000 g | PACK | Freq: Every day | ORAL | Status: DC
  Filled 2015-08-18: qty 1

## 2015-08-18 MED ORDER — SENNOSIDES-DOCUSATE SODIUM 8.6-50 MG PO TABS
2.0000 | ORAL_TABLET | Freq: Two times a day (BID) | ORAL | Status: DC
  Filled 2015-08-18: qty 2

## 2015-08-18 MED ORDER — FENTANYL 75 MCG/HR TD PT72: 75.0000 ug | MEDICATED_PATCH | TRANSDERMAL | Status: AC

## 2015-08-18 MED ORDER — HEPARIN SOD (PORK) LOCK FLUSH 100 UNIT/ML IV SOLN
500.0000 [IU] | INTRAVENOUS | Status: DC | PRN
  Administered 2015-08-18: 500 [IU]
  Filled 2015-08-18: qty 5

## 2015-08-18 MED ORDER — OXYCODONE HCL 5 MG PO TABS: 5.0000 mg | ORAL_TABLET | ORAL | Status: DC | PRN

## 2015-08-18 MED ORDER — FENTANYL 75 MCG/HR TD PT72: 75.0000 ug | MEDICATED_PATCH | TRANSDERMAL | Status: DC

## 2015-08-18 MED ORDER — FLUTICASONE PROPIONATE 50 MCG/ACT NA SUSP: 1.0000 | Freq: Every day | NASAL | Status: AC

## 2015-08-18 MED ORDER — MORPHINE SULFATE (CONCENTRATE) 10 MG/0.5ML PO SOLN
10.0000 mg | ORAL | Status: DC | PRN
Start: 2015-08-18 — End: 2015-08-18

## 2015-08-18 MED ORDER — LEVETIRACETAM ER 500 MG PO TB24: 1000.0000 mg | ORAL_TABLET | Freq: Every day | ORAL | Status: AC

## 2015-08-18 MED ORDER — DEXAMETHASONE 4 MG PO TABS
4.0000 mg | ORAL_TABLET | Freq: Every day | ORAL | Status: DC
  Administered 2015-08-18: 4 mg via ORAL
  Filled 2015-08-18: qty 1

## 2015-08-18 MED ORDER — HEPARIN SOD (PORK) LOCK FLUSH 100 UNIT/ML IV SOLN
500.0000 [IU] | INTRAVENOUS | Status: DC
  Filled 2015-08-18: qty 5

## 2015-08-18 MED ORDER — MORPHINE SULFATE (CONCENTRATE) 10 MG/0.5ML PO SOLN: 10.0000 mg | ORAL | Status: AC | PRN

## 2015-08-18 MED ORDER — DEXAMETHASONE 4 MG PO TABS: 4.0000 mg | ORAL_TABLET | Freq: Every day | ORAL | Status: AC

## 2015-08-18 MED ORDER — MEGESTROL ACETATE 20 MG PO TABS: 20.0000 mg | ORAL_TABLET | Freq: Two times a day (BID) | ORAL | Status: AC

## 2015-08-18 NOTE — Consult Note (Signed)
Consultation Note Date: 08/18/2015   Patient Name: Nicole Valentine  DOB: [birth year]  MRN: [id number]  Age / Sex: 50 y.o., female  PCP: Carol Ada, MD Referring Physician: Rosalin Hawking, MD  Reason for Consultation: Establishing goals of care  Life limiting illness: breast cancer.   Clinical Assessment/Narrative:  Nicole Valentine is a 50 y.o. female with extensive metastasis breast cancer history with presented with expressive aphasia on 08-12-15. She received IV t-PA 08/12/2015 at 1252. Patient's symptoms were concerning for seizure. MRI concerning for brain mets. It is now deemed that the patient had a possible seizure presenting as a stroke mimic secondary to leptomeningeal metastasis. Her life limiting illness is breast cancer with lung and brain mets, with concern for new leptomeningeal metastasis. Patient was seen by Oncology, it was noted that she has lung metastases, resistant to chemotherapy and bilateral pleural effusions for which a right pleurx drain was placed on 3/28 to optimize long symptom palliation. Code status was revised to DNR, further conversations led to establishment of comfort as primary goal. Hence a hospice consult was placed, patient was seen by [facility] staff earlier this am. Palliative consult placed for supportive care and to help coordinate.   The patient is a pleasant lady, pale, weak, severely cachectic, resting in bed. Her friend is visiting her at the hospital. Her husband is not at the bedside, he has gone home to make arrangements for DME delivery. Anticipate D/C home with hospice support today.   The patient complains of pain in her lower back, she attributes it to resting in bed for long intervals. She denies nausea, vomiting. 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   Patient appears stoic and does not engage in further discussions with me,  I can tell she is eager to just get home.   Contacts/Participants in Discussion: Primary Decision Maker:     Relationship to Patient   HCPOA: yes  Husband Mr Cailen Sousley.   SUMMARY OF RECOMMENDATIONS: DNR DNI Home with hospice discharge today.  Recommend d/c megace, oxyIR.  Continue fentanyl patch, MSIR PO PRN, Ativan, decadron.  [facility] making arrangements for DME delivery to the patient's home today Patient appears in good spirits, interacting with her friend who is visiting her. She is eager to get back home and is appreciative of hospice support.   Code Status/Advance Care Planning: DNR    Code Status Orders        Start     Ordered   08/16/15 0755  Do not attempt resuscitation (DNR)   Continuous    Question Answer Comment  In the event of cardiac or respiratory ARREST Do not call a "code blue"   In the event of cardiac or respiratory ARREST Do not perform Intubation, CPR, defibrillation or ACLS   In the event of cardiac or respiratory ARREST Use medication by any route, position, wound care, and other measures to relive pain and suffering. May use oxygen, suction and manual treatment of airway obstruction as needed for comfort.      08/16/15 0754    Code Status History    Date Active Date Inactive Code Status Order ID Comments User Context   08/12/2015  2:48 PM 08/16/2015  7:54 AM Full Code [id number]  Marliss Coots, PA-C Inpatient   04/19/2015 12:43 PM 04/21/2015  3:09 PM Full Code [id number]  Chauncey Cruel, MD Inpatient   11/18/2014  1:07 AM 11/19/2014  5:46 PM Full Code [id number]  Etta Quill, DO ED   11/12/2014  3:42 PM 11/13/2014  3:26 AM Full Code [id number]  Markus Daft, MD HOV   11/06/2014  1:57 PM 11/07/2014  3:26 AM Full Code [id number]  Corrie Mckusick, DO HOV   11/18/2012 11:36 AM 11/19/2012 11:16 AM Full Code [id number]  [name], MD Inpatient      Other Directives:None  Symptom Management:    as above   Palliative Prophylaxis:   Delirium  Protocol  Additional Recommendations (Limitations, Scope, Preferences):  Full Comfort Care     Psycho-social/Spiritual:  Support System: [place] Desire for further Chaplaincy support:no Additional Recommendations: Education on Hospice  Prognosis: < 2 weeks  Discharge Planning: Home with Hospice   Chief Complaint/ Primary Diagnoses: Present on Admission:  . Acute ischemic stroke ([place]) . CVA (cerebral infarction)  I have reviewed the medical record, interviewed the patient and family, and examined the patient. The following aspects are pertinent.  Past Medical History  Diagnosis Date  . Complication of anesthesia     her father and uncle both had very difficult time waking up-was  something they told them may be hereditory. she will find out.  [name] Hypothyroidism   . 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 Radiation 01/21/13-03/06/13    Right Breast  . Breast cancer ([place]) dx'd 04-10-12-rt   Social History   Social History  . Marital Status: Married    Spouse Name: N/A  . Number of Children: N/A  . Years of Education: N/A   Social History Main Topics  . Smoking status: Never Smoker   . Smokeless tobacco: Never Used  . Alcohol Use: No  . Drug Use: No  . Sexual Activity: Yes   Other Topics Concern  . None   Social History Narrative   Family History  Problem Relation Age of Onset  . Lung cancer Maternal Grandfather 32    smoker  . Prostate cancer Maternal Grandfather 90  . Throat cancer Other     Great Aunt x 2  . Liver cancer Other     Maternal Great Grandmother  . Melanoma Maternal Uncle 81  . Brain cancer Cousin 11    non-malignant  . Allergic rhinitis Neg Hx   . Asthma Neg Hx   . Eczema Neg Hx   . Urticaria Neg Hx    Scheduled Meds: .  stroke: mapping our early stages of recovery book   Does not apply Once  . dexamethasone  4 mg Oral Daily  . fentaNYL  75 mcg Transdermal Q72H  . fluticasone  1 spray Each Nare Daily  . levETIRAcetam  1,000 mg Oral Daily   . levothyroxine  175 mcg Oral QAC breakfast  . megestrol  20 mg Oral BID  . pantoprazole  40 mg Oral Daily  . polyethylene glycol  17 g Oral Daily  . senna-docusate  2 tablet Oral BID  . sodium chloride  1 spray Each Nare QID  . sodium chloride flush  10-40 mL Intracatheter Q12H   Continuous Infusions: . sodium chloride 75 mL/hr at 08/17/15 2130   PRN Meds:.acetaminophen **OR** acetaminophen, diazepam, iohexol, LORazepam, morphine CONCENTRATE, naproxen, ondansetron (ZOFRAN) IV, oxyCODONE, sodium chloride flush Medications Prior to Admission:  Prior to Admission medications   Medication Sig Start Date End Date Taking? Authorizing Provider  cholecalciferol (VITAMIN D) 1000 UNITS tablet Take 2,000 Units by mouth 2 (two) times daily.    Yes Historical Provider, MD  dextromethorphan-guaiFENesin ([place] DM) 30-600 MG 12hr  tablet Take 1 tablet by mouth 2 (two) times daily as needed for cough. Reported on 08/10/2015   Yes Historical Provider, MD  levothyroxine (SYNTHROID, LEVOTHROID) 175 MCG tablet Take 175 mcg by mouth daily before breakfast.   Yes Historical Provider, MD  Loratadine-Pseudoephedrine (CLARITIN-D 24 HOUR PO) Take 1 tablet by mouth every morning. Reported on 08/10/2015   Yes Historical Provider, MD  LORazepam (ATIVAN) 0.5 MG tablet Take 1 tablet (0.5 mg total) by mouth at bedtime as needed (Nausea or vomiting). 07/28/15  Yes Chauncey Cruel, MD  magnesium oxide (MAG-OX) 400 MG tablet Take 600 mg by mouth daily. Pt takes 2 capsules  by mouth 3 times daily. 07/29/15  Yes Historical Provider, MD  oxyCODONE (OXY IR/ROXICODONE) 5 MG immediate release tablet Take 1 tablet (5 mg total) by mouth every 6 (six) hours as needed for severe pain. 08/11/15  Yes Chauncey Cruel, MD  oxyCODONE (OXYCONTIN) 10 mg 12 hr tablet Take 1 tablet (10 mg total) by mouth every 12 (twelve) hours. 08/11/15  Yes Chauncey Cruel, MD  pantoprazole (PROTONIX) 40 MG tablet Take 1 tablet (40 mg total) by mouth daily at  12 noon. 07/28/15  Yes Heather F Boelter, NP  Saline 0.2 % GEL Place 1 Squirt into the nose at bedtime.   Yes Historical Provider, MD  sodium chloride (OCEAN) 0.65 % SOLN nasal spray Place 1 spray into both nostrils QID.   Yes Historical Provider, MD   Allergies  Allergen Reactions  . Almond Meal Rash  . Almond Oil Rash  . Other Rash    LOTIONS-unknown type  . Tegaderm Ag Mesh [Silver] Rash  . Vancomycin Rash    Red Man Syndrome    Review of Systems + for low back pain, weakness, lack of appetite.   Physical Exam Weak frail appearing lady Resting in bed S1 S2 Clear Abdomen soft No edema  Vital Signs: BP 126/83 mmHg  Pulse 115  Temp(Src) 97.8 F (36.6 C) (Oral)  Resp 27  Ht 5\' 7"  (1.702 m)  Wt 60 kg (132 lb 4.4 oz)  BMI 20.71 kg/m2  SpO2 98%  LMP 03/29/2012  SpO2: SpO2: 98 % O2 Device:SpO2: 98 % O2 Flow Rate: .O2 Flow Rate (L/min): 3 L/min  IO: Intake/output summary:  Intake/Output Summary (Last 24 hours) at 08/18/15 1153 Last data filed at 08/18/15 0700  Gross per 24 hour  Intake   1215 ml  Output    600 ml  Net    615 ml    LBM: Last BM Date: 08/15/15 Baseline Weight: Weight: 60 kg (132 lb 4.4 oz) Most recent weight: Weight: 60 kg (132 lb 4.4 oz)      Palliative Assessment/Data:  Flowsheet Rows        Most Recent Value   Intake Tab    Referral Department  Neurology   Unit at Time of Referral  ICU   Palliative Care Primary Diagnosis  Cancer   Palliative Care Type  New Palliative care   Reason for referral  Counsel Regarding Hospice   Date first seen by Palliative Care  08/18/15   Clinical Assessment    Palliative Performance Scale Score  30%   Pain Max last 24 hours  5   Pain Min Last 24 hours  4   Dyspnea Max Last 24 Hours  5   Dyspnea Min Last 24 hours  4   Psychosocial & Spiritual Assessment    Palliative Care Outcomes    Patient/Family meeting held?  Yes   Who was at the meeting?  patient, friend.    Palliative Care Outcomes  Counseled  regarding hospice   Palliative Care follow-up planned  Yes, Facility      Additional Data Reviewed:  CBC:    Component Value Date/Time   WBC 9.3 08/18/2015 0401   WBC 7.4 08/10/2015 1428   HGB 8.9* 08/18/2015 0401   HGB 10.3* 08/10/2015 1428   HCT 29.0* 08/18/2015 0401   HCT 31.1* 08/10/2015 1428   PLT 217 08/18/2015 0401   PLT 323 08/10/2015 1428   MCV 107.4* 08/18/2015 0401   MCV 105.3* 08/10/2015 1428   NEUTROABS 5.9 08/12/2015 1218   NEUTROABS 6.3 08/10/2015 1428   LYMPHSABS 0.3* 08/12/2015 1218   LYMPHSABS 0.3* 08/10/2015 1428   MONOABS 1.0 08/12/2015 1218   MONOABS 0.7 08/10/2015 1428   EOSABS 0.0 08/12/2015 1218   EOSABS 0.0 08/10/2015 1428   BASOSABS 0.0 08/12/2015 1218   BASOSABS 0.0 08/10/2015 1428   Comprehensive Metabolic Panel:    Component Value Date/Time   NA 139 08/18/2015 0401   NA 138 08/10/2015 1428   K 4.3 08/18/2015 0401   K 3.7 08/10/2015 1428   CL 97* 08/18/2015 0401   CL 101 10/18/2012 0859   CO2 31 08/18/2015 0401   CO2 34* 08/10/2015 1428   BUN 12 08/18/2015 0401   BUN 13.5 08/10/2015 1428   CREATININE 0.51 08/18/2015 0401   CREATININE 0.6 08/10/2015 1428   GLUCOSE 90 08/18/2015 0401   GLUCOSE 108 08/10/2015 1428   GLUCOSE 83 10/18/2012 0859   CALCIUM 8.5* 08/18/2015 0401   CALCIUM 9.4 08/10/2015 1428   AST 14* 08/17/2015 0150   AST 17 08/10/2015 1428   ALT 6* 08/17/2015 0150   ALT <9 08/10/2015 1428   ALKPHOS 56 08/17/2015 0150   ALKPHOS 105 08/10/2015 1428   BILITOT 0.8 08/17/2015 0150   BILITOT 0.59 08/10/2015 1428   PROT 4.5* 08/17/2015 0150   PROT 6.6 08/10/2015 1428   ALBUMIN 1.5* 08/17/2015 0150   ALBUMIN 2.4* 08/10/2015 1428     Time In: 10 Time Out: 11 Time Total: 60 min  Greater than 50%  of this time was spent counseling and coordinating care related to the above assessment and plan.  Signed by: Loistine Chance, MD [id number] Loistine Chance, MD  08/18/2015, 11:53 AM  Please contact Palliative Medicine Team phone at  [phone] for questions and concerns.

## 2015-08-18 NOTE — Progress Notes (Signed)
Notified by Neoma Laming [facility] of family request for Hospice and [place] services at home after discharge. Chart and patient information currently under review to confirm hospice eligibility.   Spoke with patient and husband, at bedside to initiate education related to hospice philosophy, services and team approach to care. Family verbalized understanding of the information provided. Per discussion, plan is for discharge to home by [facility] today.   Please send signed completed DNR form home with patient.  Patient will need prescriptions for discharge comfort medications.  DME needs discussed and family requested hospital bed, OBT, BSC, W/C and walker for delivery to the home today.  [facility] equipment manager Jewel Ysidro Evert notified and will contact [employer] to arrange delivery to the home.  The home address has been verified and is correct in the chart; [place] is family member to be contacted to arrange time of delivery. Please make sure equipment is delivered prior to hospital discharge.  [facility] Referral Center aware of the above.  Completed discharge summary will need to be faxed to [facility] at [phone] when final.  Please notify [facility] when patient is ready to leave unit at discharge-call [phone].  [facility] information and contact numbers have been given to patient/husband during visit.  Above information shared with Neoma Laming, [facility].   Please call with any questions.  Thank You,  Freddi Starr RN, [facility] Liaison  [phone]

## 2015-08-18 NOTE — Progress Notes (Signed)
NCM verified address with patient, call [facility] for transport at 16:10, informed RN  And information is on chart.  NCM spoke with patient  And spouse and rep with [facility].

## 2015-08-18 NOTE — Progress Notes (Signed)
Nutrition Follow-up  DOCUMENTATION CODES:   Severe malnutrition in context of chronic illness  NUTRITION DIAGNOSIS:   Malnutrition related to chronic illness (stage IV cancer) as evidenced by severe depletion of body fat, severe depletion of muscle mass, percent weight loss.  Ongoing  NEW GOAL:   Patient to consume meals and snacks as desired.   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 She currently recieves chemo through [facility] study. 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   Plans have been made for patient to go home with Hospice care soon. She is receiving snacks TID between meals per her preferences. No further nutrition intervention indicated at this time.  Diet Order:  Diet regular Room service appropriate?: Yes; Fluid consistency:: Thin   Molli Barrows, RD, LDN, [place] Pager [phone] After Hours Pager [phone]

## 2015-08-18 NOTE — Op Note (Signed)
NAMENHI, PIVONKA NO.:  [id number]  MEDICAL RECORD NO.:  [id number]  LOCATION:  3S03C                        FACILITY:  [facility]  PHYSICIAN:  [name], MD    DATE OF BIRTH:  [birth year]  DATE OF PROCEDURE:  08/17/2015 DATE OF DISCHARGE:                              OPERATIVE REPORT   PREOPERATIVE DIAGNOSIS:  Stage IV metastatic breast cancer with bilateral pleural effusions right greater than left.  PROCEDURE PERFORMED:  Placement of right PleurX catheter with SonoSite ultrasound and fluoroscopy guidance.  SURGEON:  [name], M.D.  BRIEF HISTORY:  The patient is a 50 year old female with triple negative stage IV breast cancer who for the past 6-8 months has had recurrent pleural effusions the right significantly larger.  She was seen in the office the week previous and discussed placement of PleurX catheters but was very determined to visit her son in [place] and wanted to delay placing the PleurX catheter.  Unfortunately, she had a neurologic event and was admitted to the hospital.  After discussion with her husband and Dr. [name], it was decided to proceed with __________ placement of a right PleurX catheter to assist in her respiratory status.  She agreed and signed informed consent.  DESCRIPTION OF PROCEDURE:  The patient was placed with the right side elevated slightly.  The right chest was prepped with Betadine.  She was given light intravenous sedation.  Appropriate time-out was performed. The right side had previously been marked using the SonoSite.  Area of pleural fluid in the right chest was identified.  A 10 mL of 1% lidocaine was infiltrated.  A 16-gauge needle was introduced into the right chest with return of straw-colored pleural fluid.  Under fluoroscopic guidance, a guidewire was placed into the right chest. The PleurX catheter was then tunneled adjusting the cuff appropriately placed over the guidewire.  Dilator was  placed and then a peel-away sheath, and the PleurX catheter was introduced into the right chest. Approximately, 800 mL of pleural fluid was then removed with close to a liter removed including what was lost when placing the catheter.  To prevent the patient from becoming immediately uncomfortable, we decided to stop drainage at this point, and drain the CT catheter on a daily basis.  The patient tolerated the procedure without complications. Sponge and needle count was reported as correct.  She was transported to the recovery room in good and stable condition.     [name], MD     EG/MEDQ  D:  08/18/2015  T:  08/18/2015  Job:  [id number]

## 2015-08-18 NOTE — Progress Notes (Signed)
Pt discharged per MD order, all instructions reviewed and all questions answered.  

## 2015-08-18 NOTE — Progress Notes (Signed)
Family visited [facility] yesterday and liked the facility but after much discussion they have decided to take patient home, with Hospice support. They are hoping for discharge tomorrow 08/18/2015  Husband is clearing a room on ground floor. Patient will need a hospital bed, BSC, overlay tray and w/c. She already has [employer], If all supplies oculd be delivered today that would be best.  She will need HHN to drain pleurx BIW unless that can be performed by Hospice's nurses. Family can eventually be trained on the procedure.  I will review patient's meds now and would suggest continuing meds at home as written.  I will be out of town 3/29 - 4/2 but will be back 4/3 and will follow-up with patient then.  Greatly appreciate everyone's help to this patient!

## 2015-08-18 NOTE — Progress Notes (Addendum)
      [address]       [place] [address]             [phone]      1 Day Post-Op Procedure(s) (LRB): INSERTION PLEURAL DRAINAGE CATHETER RIGHT CHEST (Right)   Subjective:  Ms. Panzarella is doing okay.  She states her pain is manageable at her chest tube site.  They have decided to go home with hospice care.  Objective: Vital signs in last 24 hours: Temp:  [97.5 F (36.4 C)-98.5 F (36.9 C)] 97.8 F (36.6 C) (03/29 0729) Pulse Rate:  [107-147] 115 (03/29 0729) Cardiac Rhythm:  [-] Sinus tachycardia (03/29 0729) Resp:  [14-28] 27 (03/29 0729) BP: (110-129)/(67-83) 126/83 mmHg (03/29 0729) SpO2:  [94 %-100 %] 98 % (03/29 0729)  Intake/Output from previous day: 03/28 0701 - 03/29 0700 In: 1465 [P.O.:240; I.V.:1225] Out: 1205 [Urine:600; Blood:5]  General appearance: alert, cooperative and no distress Heart: regular rate and rhythm Lungs: diminished breath sounds bibasilar Wound: clean and dry  Lab Results:  Recent Labs  08/17/15 0150 08/18/15 0401  WBC 6.3 9.3  HGB 7.7* 8.9*  HCT 24.9* 29.0*  PLT 168 217   BMET:  Recent Labs  08/17/15 0700 08/18/15 0401  NA 139 139  K 3.0* 4.3  CL 94* 97*  CO2 32 31  GLUCOSE 80 90  BUN 10 12  CREATININE 0.54 0.51  CALCIUM 8.4* 8.5*    PT/INR:  Recent Labs  08/17/15 0150  LABPROT 20.6*  INR 1.77*   ABG    Component Value Date/Time   TCO2 34 08/12/2015 1221   CBG (last 3)  No results for input(s): GLUCAP in the last 72 hours.  Assessment/Plan: S/P Procedure(s) (LRB): INSERTION PLEURAL DRAINAGE CATHETER RIGHT CHEST (Right)  1. Right Pleural Effusion- I drained pleur-x and removed 500 cc serous fluid, will continue daily drainage 2. Dispo- home with hospice care per primary   LOS: 6 days    Ahmed Prima, Junie Panning 08/18/2015

## 2015-08-18 NOTE — Care Management Note (Signed)
Case Management Note  Patient Details  Name: Nicole Valentine MRN: [id number] Date of Birth: 1965-05-27  Subjective/Objective:   NCM received a call from [name] and a note from MD stating that patient and her family has changed their mind and would like to go home with home hospice, NCM spoke with patient and spouse and the would like [facility], referral given to [facility] ( onsite rep).  Spouse states they have home oxygen with [facility] already, they will need hospital bed,bedside table, and w/chair and rolling walker. Patient is allergic to tegaderm that comes with the pleurx drain kit and will need opsite to use instead with drain kits.  Stacy the onsite rep with [facility] states they will be able to order some op site for patient , but we will need to give her some to go home with.  NCM informed Careers adviser.  [name] also states that Hospice will take care of the drainage for patient and also teach family member as well.  NCM faxed Pluerx drain kt information to [facility], filled out [facility] information for transport and left on chart. Patient is for dc today , awaiting notification from patient's spouse to say they have DME at the home, then we can let MD know to do dc  And  call [facility] for transport.                    Action/Plan:   Expected Discharge Date:                  Expected Discharge Plan:  [place]  In-House Referral:  Clinical Social Work  Discharge planning Services  CM Consult  Post Acute Care Choice:  Hospice Choice offered to:     DME Arranged:    DME Agency:     HH Arranged:    [place] Agency:     Status of Service:  Completed, signed off  Medicare Important Message Given:    Date Medicare IM Given:    Medicare IM give by:    Date Additional Medicare IM Given:    Additional Medicare Important Message give by:     If discussed at [place] of Stay Meetings, dates discussed:    Additional Comments:  Zenon Mayo, RN 08/18/2015, 11:49 AM

## 2015-08-19 ENCOUNTER — Ambulatory Visit: Payer: BLUE CROSS/BLUE SHIELD

## 2015-08-19 ENCOUNTER — Other Ambulatory Visit: Payer: BLUE CROSS/BLUE SHIELD

## 2015-08-19 ENCOUNTER — Ambulatory Visit: Payer: BLUE CROSS/BLUE SHIELD | Admitting: Nurse Practitioner

## 2015-08-20 ENCOUNTER — Telehealth: Payer: Self-pay | Admitting: *Deleted

## 2015-08-20 LAB — CULTURE, BODY FLUID W GRAM STAIN -BOTTLE

## 2015-08-20 LAB — CULTURE, BODY FLUID-BOTTLE: CULTURE: NO GROWTH

## 2015-08-20 NOTE — Telephone Encounter (Signed)
This RN spoke with [place] RN with [facility] per visit today.  Pt is declining ( not noted as actively dying ) and not able to swallow tablets well.  Request is for keppra to be converted to liquid formula.  Pt is sleeping majority of the time - no signs of discomfort.  Of note this RN spoke with

## 2015-08-23 ENCOUNTER — Other Ambulatory Visit: Payer: Self-pay | Admitting: Oncology

## 2015-08-30 ENCOUNTER — Other Ambulatory Visit: Payer: Self-pay | Admitting: Oncology

## 2015-08-31 ENCOUNTER — Telehealth: Payer: Self-pay | Admitting: Oncology

## 2015-08-31 NOTE — Telephone Encounter (Signed)
Certificate of death received - Patient status changed to deceased.

## 2015-09-09 ENCOUNTER — Ambulatory Visit: Payer: BLUE CROSS/BLUE SHIELD | Admitting: Oncology

## 2015-09-09 ENCOUNTER — Ambulatory Visit: Payer: BLUE CROSS/BLUE SHIELD

## 2015-09-09 ENCOUNTER — Other Ambulatory Visit: Payer: BLUE CROSS/BLUE SHIELD

## 2015-09-20 DEATH — deceased

## 2015-09-30 ENCOUNTER — Other Ambulatory Visit: Payer: BLUE CROSS/BLUE SHIELD

## 2015-09-30 ENCOUNTER — Ambulatory Visit: Payer: BLUE CROSS/BLUE SHIELD

## 2015-09-30 ENCOUNTER — Ambulatory Visit: Payer: BLUE CROSS/BLUE SHIELD | Admitting: Oncology

## 2015-10-04 ENCOUNTER — Ambulatory Visit: Payer: BLUE CROSS/BLUE SHIELD | Admitting: Radiation Oncology

## 2015-11-06 ENCOUNTER — Other Ambulatory Visit: Payer: Self-pay | Admitting: Nurse Practitioner

## 2016-01-10 NOTE — Telephone Encounter (Signed)
error 

## 2017-02-16 IMAGING — CR DG CHEST 2V
2 series · 2 of 2 positions shown · non-contrast
Comparison: 11/18/2014

CLINICAL DATA: Diffuse chest, side, and back pain, history
metastatic RIGHT breast cancer

EXAM:
CHEST  2 VIEW

[w chest pa]
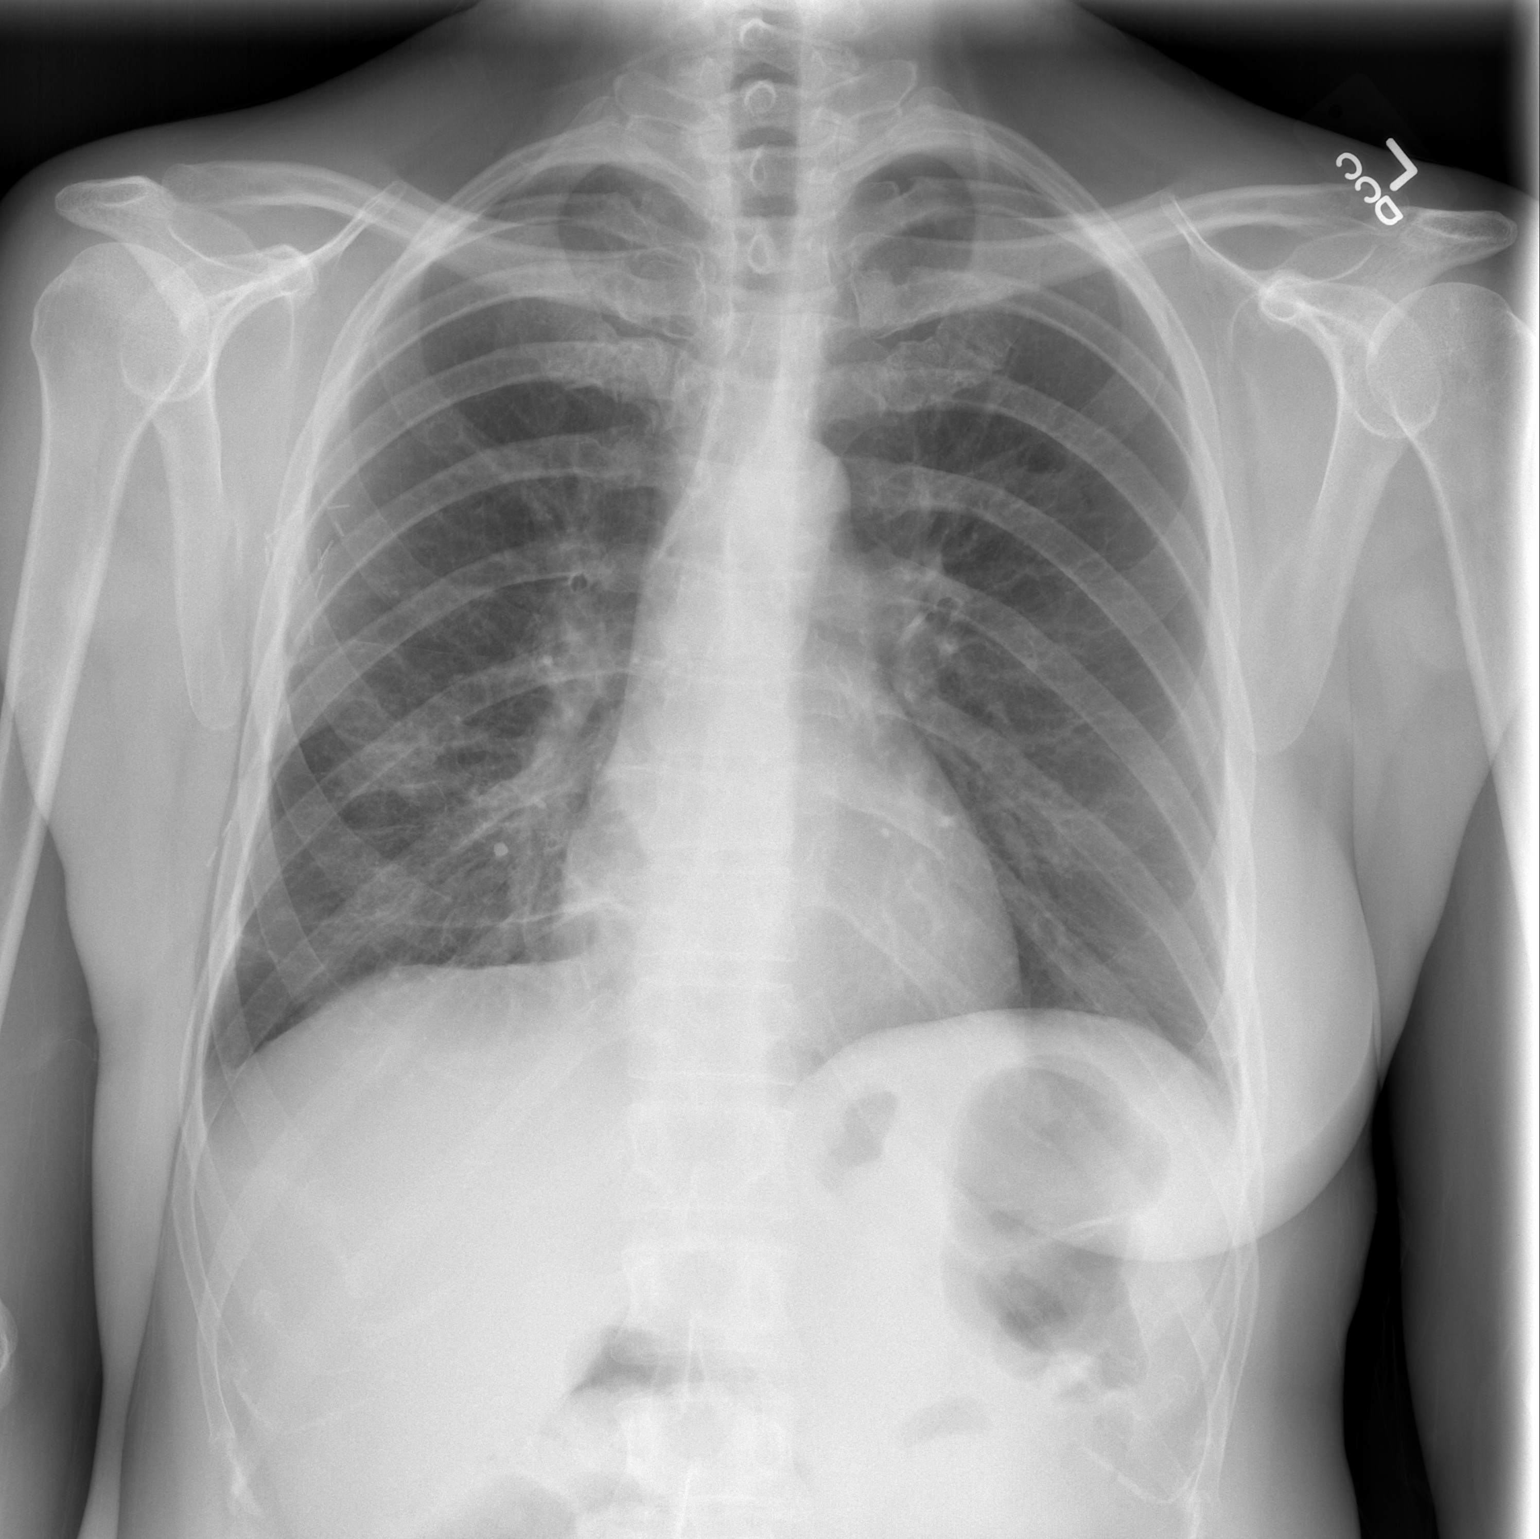

[w chest lat]
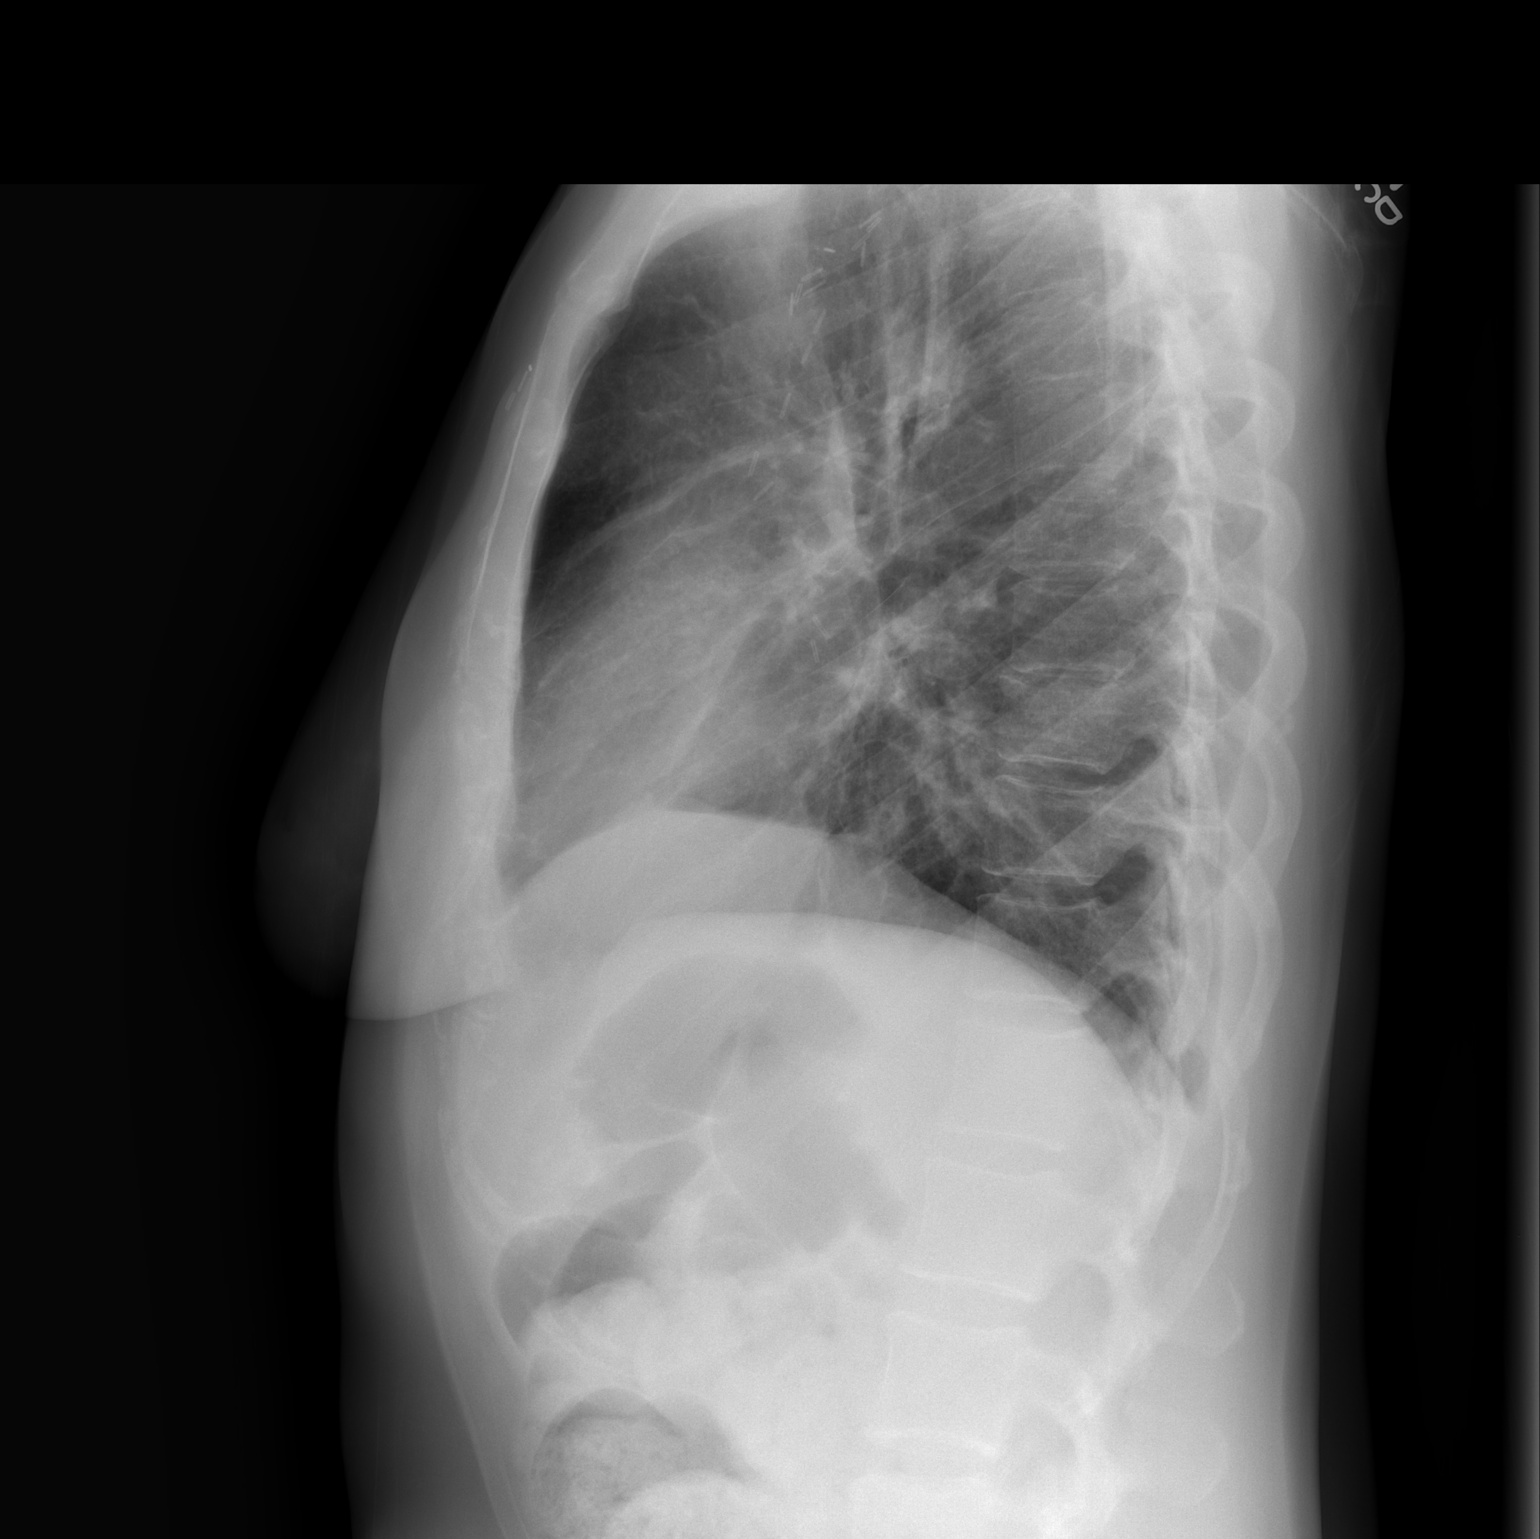

[2 of 2 positions shown; findings below may reference images not displayed]

FINDINGS: Normal heart size, mediastinal contours, and pulmonary vascularity.

Interval removal of LEFT jugular Port-A-Cath.

Improved aeration in the lower RIGHT chest since previous exam with
decreased pleural effusion and basilar atelectasis.

Emphysematous and bronchitic changes consistent with COPD.

Linear subsegmental atelectasis RIGHT base, minimally LEFT
costophrenic angle.

Remaining lungs clear.

No pneumothorax or acute osseous findings.

Post RIGHT mastectomy and axillary node dissection.
IMPRESSION: Chronic bronchitic changes.

Mild residual subsegmental atelectasis RIGHT base with significantly
improved RIGHT basilar aeration and resolution of RIGHT pleural
effusion since previous exam.

## 2017-08-25 IMAGING — NM NM BONE WHOLE BODY
2 series · 2 of 2 positions shown · non-contrast
Comparison: PET-CT study March 17, 2015

CLINICAL DATA: Breast malignancy, right groin pain, no history of
trauma

EXAM:
NUCLEAR MEDICINE WHOLE BODY BONE SCAN
TECHNIQUE: Whole body anterior and posterior images were obtained approximately
3 hours after intravenous injection of radiopharmaceutical.
RADIOPHARMACEUTICALS:  26.5 mCi Cechnetium-XXm MDP IV

[Series 1: whole body · 2.66mm/px · 1 of 1 slices shown (1 of 2)]
[im 1/1]
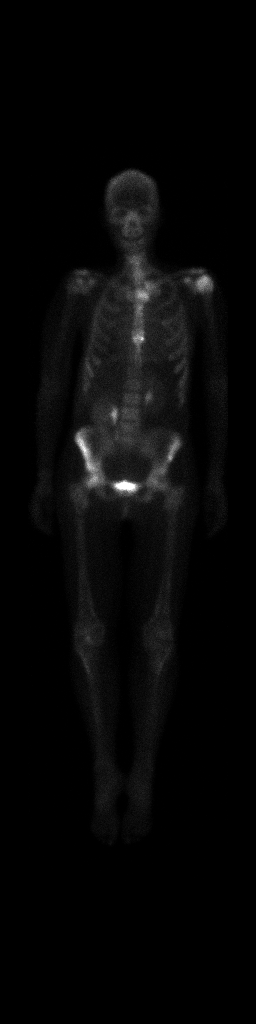

[Series 1: whole body · 2.66mm/px · 1 of 1 slices shown (2 of 2)]
[im 1/1]
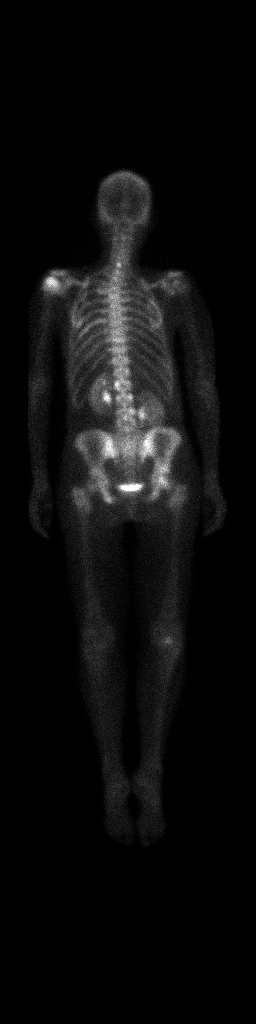

[2 of 2 positions shown; findings below may reference images not displayed]

FINDINGS: There is adequate uptake of the radiopharmaceutical by the skeleton.
There is adequate soft tissue clearance and renal activity. Uptake
within the calvarium is normal. Uptake within the cervical spine
reveals a focus posterior likely within the spinous process of T7.
There is a small focus of increased uptake in the pedicle of L1.

There is increased uptake within the left aspect of the manubrium
and the lowermost sternal segment.

There is asymmetrically increased uptake in the humeral head on the
left as compared to the right.

There is mildly increased uptake in the roof of the right acetabulum
and in the right femoral head. Activity more distally in the lower
extremities is normal with exception of a tiny focus of uptake
associated with the right knee.
IMPRESSION: 1. Abnormally increased uptake within the left humeral head, the
right femoral head, the roof of the right acetabulum is suspicious
for metastatic disease. Plain film evaluation of these regions is
recommended.
2. Activity within the spine and sternum is most likely degenerative
in nature.
